# Patient Record
Sex: Female | Born: 1940 | Race: White | Hispanic: No | Smoking: Former smoker
Health system: Southern US, Community
[De-identification: ages and names within clinical notes are randomized; demographics above are authoritative.]

## PROBLEM LIST (undated history)

## (undated) DIAGNOSIS — E119 Type 2 diabetes mellitus without complications: Secondary | ICD-10-CM

## (undated) DIAGNOSIS — I2510 Atherosclerotic heart disease of native coronary artery without angina pectoris: Secondary | ICD-10-CM

## (undated) DIAGNOSIS — K219 Gastro-esophageal reflux disease without esophagitis: Secondary | ICD-10-CM

## (undated) DIAGNOSIS — I441 Atrioventricular block, second degree: Secondary | ICD-10-CM

## (undated) DIAGNOSIS — M1990 Unspecified osteoarthritis, unspecified site: Secondary | ICD-10-CM

## (undated) DIAGNOSIS — G4730 Sleep apnea, unspecified: Secondary | ICD-10-CM

## (undated) DIAGNOSIS — I480 Paroxysmal atrial fibrillation: Secondary | ICD-10-CM

## (undated) DIAGNOSIS — Z7901 Long term (current) use of anticoagulants: Secondary | ICD-10-CM

## (undated) DIAGNOSIS — E669 Obesity, unspecified: Secondary | ICD-10-CM

## (undated) DIAGNOSIS — J449 Chronic obstructive pulmonary disease, unspecified: Secondary | ICD-10-CM

## (undated) DIAGNOSIS — E785 Hyperlipidemia, unspecified: Secondary | ICD-10-CM

## (undated) DIAGNOSIS — K859 Acute pancreatitis without necrosis or infection, unspecified: Secondary | ICD-10-CM

## (undated) DIAGNOSIS — Z950 Presence of cardiac pacemaker: Secondary | ICD-10-CM

## (undated) DIAGNOSIS — I10 Essential (primary) hypertension: Secondary | ICD-10-CM

## (undated) DIAGNOSIS — Z9981 Dependence on supplemental oxygen: Secondary | ICD-10-CM

## (undated) HISTORY — PX: REPAIR RECTOCELE: SUR1206

## (undated) HISTORY — DX: Presence of cardiac pacemaker: Z95.0

## (undated) HISTORY — DX: Obesity, unspecified: E66.9

## (undated) HISTORY — DX: Atrioventricular block, second degree: I44.1

## (undated) HISTORY — PX: CYSTOCELE REPAIR: SHX163

## (undated) HISTORY — DX: Hyperlipidemia, unspecified: E78.5

## (undated) HISTORY — DX: Unspecified osteoarthritis, unspecified site: M19.90

## (undated) HISTORY — PX: TONSILLECTOMY: SUR1361

## (undated) HISTORY — DX: Long term (current) use of anticoagulants: Z79.01

## (undated) HISTORY — DX: Chronic obstructive pulmonary disease, unspecified: J44.9

## (undated) HISTORY — DX: Atherosclerotic heart disease of native coronary artery without angina pectoris: I25.10

## (undated) HISTORY — DX: Paroxysmal atrial fibrillation: I48.0

## (undated) HISTORY — PX: KNEE ARTHROSCOPY: SHX127

## (undated) HISTORY — DX: Acute pancreatitis without necrosis or infection, unspecified: K85.90

## (undated) HISTORY — PX: OVARY SURGERY: SHX727

---

## 1969-11-16 HISTORY — PX: TOTAL ABDOMINAL HYSTERECTOMY: SHX209

## 1998-02-07 ENCOUNTER — Other Ambulatory Visit: Admission: RE | Admit: 1998-02-07 | Discharge: 1998-02-07 | Payer: Self-pay | Admitting: Family Medicine

## 1999-01-09 ENCOUNTER — Ambulatory Visit (HOSPITAL_COMMUNITY): Admission: RE | Admit: 1999-01-09 | Discharge: 1999-01-09 | Payer: Self-pay | Admitting: Otolaryngology

## 1999-01-09 ENCOUNTER — Encounter: Payer: Self-pay | Admitting: Otolaryngology

## 2000-02-16 ENCOUNTER — Encounter: Admission: RE | Admit: 2000-02-16 | Discharge: 2000-02-16 | Payer: Self-pay | Admitting: Family Medicine

## 2000-02-16 ENCOUNTER — Encounter: Payer: Self-pay | Admitting: Family Medicine

## 2001-03-01 ENCOUNTER — Encounter: Payer: Self-pay | Admitting: Family Medicine

## 2001-03-01 ENCOUNTER — Encounter: Admission: RE | Admit: 2001-03-01 | Discharge: 2001-03-01 | Payer: Self-pay | Admitting: Obstetrics & Gynecology

## 2002-03-14 ENCOUNTER — Encounter: Payer: Self-pay | Admitting: Family Medicine

## 2002-03-14 ENCOUNTER — Encounter: Admission: RE | Admit: 2002-03-14 | Discharge: 2002-03-14 | Payer: Self-pay | Admitting: Family Medicine

## 2003-03-05 ENCOUNTER — Ambulatory Visit (HOSPITAL_COMMUNITY): Admission: RE | Admit: 2003-03-05 | Discharge: 2003-03-05 | Payer: Self-pay | Admitting: Gastroenterology

## 2003-03-05 ENCOUNTER — Encounter: Payer: Self-pay | Admitting: Gastroenterology

## 2003-03-07 ENCOUNTER — Encounter (INDEPENDENT_AMBULATORY_CARE_PROVIDER_SITE_OTHER): Payer: Self-pay | Admitting: Specialist

## 2003-03-07 ENCOUNTER — Ambulatory Visit (HOSPITAL_COMMUNITY): Admission: RE | Admit: 2003-03-07 | Discharge: 2003-03-07 | Payer: Self-pay | Admitting: Gastroenterology

## 2003-03-16 ENCOUNTER — Encounter: Payer: Self-pay | Admitting: Family Medicine

## 2003-03-16 ENCOUNTER — Encounter: Admission: RE | Admit: 2003-03-16 | Discharge: 2003-03-16 | Payer: Self-pay | Admitting: Family Medicine

## 2004-04-30 ENCOUNTER — Encounter: Admission: RE | Admit: 2004-04-30 | Discharge: 2004-04-30 | Payer: Self-pay | Admitting: Family Medicine

## 2005-05-05 ENCOUNTER — Encounter: Admission: RE | Admit: 2005-05-05 | Discharge: 2005-05-05 | Payer: Self-pay | Admitting: Family Medicine

## 2007-11-28 ENCOUNTER — Ambulatory Visit (HOSPITAL_COMMUNITY): Admission: RE | Admit: 2007-11-28 | Discharge: 2007-11-28 | Payer: Self-pay | Admitting: Otolaryngology

## 2008-06-26 ENCOUNTER — Encounter: Admission: RE | Admit: 2008-06-26 | Discharge: 2008-06-26 | Payer: Self-pay | Admitting: Family Medicine

## 2008-10-08 DIAGNOSIS — N3946 Mixed incontinence: Secondary | ICD-10-CM

## 2008-10-29 ENCOUNTER — Ambulatory Visit (HOSPITAL_COMMUNITY): Admission: RE | Admit: 2008-10-29 | Discharge: 2008-10-29 | Payer: Self-pay | Admitting: Otolaryngology

## 2009-01-23 DIAGNOSIS — J309 Allergic rhinitis, unspecified: Secondary | ICD-10-CM

## 2009-05-03 ENCOUNTER — Encounter: Admission: RE | Admit: 2009-05-03 | Discharge: 2009-05-03 | Payer: Self-pay | Admitting: Family Medicine

## 2009-05-24 ENCOUNTER — Emergency Department (HOSPITAL_COMMUNITY): Admission: EM | Admit: 2009-05-24 | Discharge: 2009-05-24 | Payer: Self-pay | Admitting: Emergency Medicine

## 2009-09-16 ENCOUNTER — Encounter: Admission: RE | Admit: 2009-09-16 | Discharge: 2009-09-16 | Payer: Self-pay | Admitting: Family Medicine

## 2010-05-04 ENCOUNTER — Emergency Department (HOSPITAL_BASED_OUTPATIENT_CLINIC_OR_DEPARTMENT_OTHER): Admission: EM | Admit: 2010-05-04 | Discharge: 2010-05-05 | Payer: Self-pay | Admitting: Emergency Medicine

## 2010-05-04 ENCOUNTER — Ambulatory Visit: Payer: Self-pay | Admitting: Diagnostic Radiology

## 2010-06-20 ENCOUNTER — Ambulatory Visit (HOSPITAL_COMMUNITY): Admission: RE | Admit: 2010-06-20 | Discharge: 2010-06-20 | Payer: Self-pay | Admitting: Gastroenterology

## 2010-09-18 ENCOUNTER — Telehealth (INDEPENDENT_AMBULATORY_CARE_PROVIDER_SITE_OTHER): Payer: Self-pay | Admitting: Radiology

## 2010-09-18 ENCOUNTER — Ambulatory Visit: Payer: Self-pay | Admitting: Cardiology

## 2010-09-22 ENCOUNTER — Encounter: Payer: Self-pay | Admitting: Internal Medicine

## 2010-09-22 ENCOUNTER — Encounter (HOSPITAL_COMMUNITY)
Admission: RE | Admit: 2010-09-22 | Discharge: 2010-11-15 | Payer: Self-pay | Source: Home / Self Care | Attending: Cardiology | Admitting: Cardiology

## 2010-09-22 ENCOUNTER — Ambulatory Visit: Payer: Self-pay

## 2010-09-22 ENCOUNTER — Encounter: Payer: Self-pay | Admitting: Cardiology

## 2010-09-23 ENCOUNTER — Ambulatory Visit (HOSPITAL_COMMUNITY): Admission: RE | Admit: 2010-09-23 | Discharge: 2010-09-23 | Payer: Self-pay | Admitting: Cardiology

## 2010-09-23 ENCOUNTER — Encounter: Payer: Self-pay | Admitting: Cardiology

## 2010-09-23 ENCOUNTER — Ambulatory Visit: Payer: Self-pay | Admitting: Cardiology

## 2010-09-23 ENCOUNTER — Ambulatory Visit: Payer: Self-pay

## 2010-09-23 ENCOUNTER — Encounter (INDEPENDENT_AMBULATORY_CARE_PROVIDER_SITE_OTHER): Payer: Self-pay | Admitting: *Deleted

## 2010-10-02 ENCOUNTER — Ambulatory Visit: Payer: Self-pay | Admitting: Cardiology

## 2010-10-06 ENCOUNTER — Ambulatory Visit: Payer: Self-pay | Admitting: Cardiology

## 2010-10-06 ENCOUNTER — Inpatient Hospital Stay (HOSPITAL_COMMUNITY)
Admission: RE | Admit: 2010-10-06 | Discharge: 2010-10-07 | Payer: Self-pay | Source: Home / Self Care | Admitting: Cardiology

## 2010-10-06 HISTORY — PX: INSERT / REPLACE / REMOVE PACEMAKER: SUR710

## 2010-10-22 ENCOUNTER — Encounter: Payer: Self-pay | Admitting: Internal Medicine

## 2010-10-29 ENCOUNTER — Ambulatory Visit: Payer: Self-pay

## 2010-10-29 ENCOUNTER — Encounter: Payer: Self-pay | Admitting: Internal Medicine

## 2010-10-30 ENCOUNTER — Encounter
Admission: RE | Admit: 2010-10-30 | Discharge: 2010-10-30 | Payer: Self-pay | Source: Home / Self Care | Attending: Family Medicine | Admitting: Family Medicine

## 2010-11-11 ENCOUNTER — Ambulatory Visit: Payer: Self-pay | Admitting: Cardiology

## 2010-12-02 ENCOUNTER — Ambulatory Visit: Payer: Self-pay | Admitting: Cardiology

## 2010-12-07 ENCOUNTER — Encounter: Payer: Self-pay | Admitting: Gastroenterology

## 2010-12-18 NOTE — Miscellaneous (Signed)
Summary: Device preload  Clinical Lists Changes  Observations: Added new observation of PPM INDICATN: Brady Mobitz II (10/22/2010 13:23) Added new observation of MAGNET RTE: BOL 85 ERI 65 (10/22/2010 13:23) Added new observation of PPMLEADSTAT1: active (10/22/2010 13:23) Added new observation of PPMLEADSER1: [id number]  (10/22/2010 13:23) Added new observation of PPMLEADMOD1: 4470  (10/22/2010 13:23) Added new observation of PPMLEADDOI1: 10/06/2010  (10/22/2010 13:23) Added new observation of PPMLEADLOC1: RV  (10/22/2010 13:23) Added new observation of PPM IMP Brandi Hunter: Duffy Rhody Tennant,Brandi Hunter  (10/22/2010 13:23) Added new observation of PPM DOI: 10/06/2010  (10/22/2010 13:23) Added new observation of PPM SERL#: [id number]  (10/22/2010 13:23) Added new observation of PPM MODL#: [id number]  (10/22/2010 38:18) Added new observation of PACEMAKERMFG: Medtronic  (10/22/2010 13:23) Added new observation of PACEMAKER Brandi Hunter: Brandi Range, Brandi Hunter  (10/22/2010 13:23)      PPM Specifications Following Brandi Hunter:  Brandi Range, Brandi Hunter     PPM Vendor:  Medtronic     PPM Model Number:  ADDRL1     PPM Serial Number:  [id number] H PPM DOI:  10/06/2010     PPM Implanting Brandi Hunter:  Rolla Plate  Lead 1    Location: RV     DOI: 10/06/2010     Model #: [id number]     Serial #: [id number]     Status: active  Magnet Response Rate:  BOL 85 ERI 65  Indications:  Huston Foley Mobitz II

## 2010-12-18 NOTE — Procedures (Signed)
Summary: wound check/medtronic   Current Medications (verified): 1)  Losartan Potassium 100 Mg Tabs (Losartan Potassium) .... One By Mouth Daily 2)  Amlodipine Besylate 5 Mg Tabs (Amlodipine Besylate) .... One By Mouth Daily 3)  Pantoprazole Sodium 40 Mg Tbec (Pantoprazole Sodium) .... One By Mouth Daily 4)  Metformin Hcl 500 Mg Tabs (Metformin Hcl) .... One By Mouth Two Times A Day 5)  Oxybutynin Chloride 10 Mg Xr24h-Tab (Oxybutynin Chloride) .... One By Mouth Daily 6)  Citalopram Hydrobromide 40 Mg Tabs (Citalopram Hydrobromide) .... One By Mouth Daily 7)  Fenofibrate 160 Mg Tabs (Fenofibrate) .... One By Mouth Daily 8)  Furosemide 20 Mg Tabs (Furosemide) .... One By Mouth Daily 9)  Symbicort 160-4.5 Mcg/act Aero (Budesonide-Formoterol Fumarate) .... 2 Puffs Two Times A Day 10)  Pravachol 40 Mg Tabs (Pravastatin Sodium) .... One By Mouth Daily  Allergies (verified): No Known Drug Allergies  PPM Specifications Following MD:  Hillis Range, MD     PPM Vendor:  Medtronic     PPM Model Number:  ADDRL1     PPM Serial Number:  [id number] PPM DOI:  10/06/2010     PPM Implanting MD:  Duffy Rhody Tennant,MD  Lead 1    Location: RV     DOI: 10/06/2010     Model #: 4470     Serial #: [id number]     Status: active  Magnet Response Rate:  BOL 85 ERI 65  Indications:  Huston Foley Mobitz II   PPM Follow Up Remote Check?  No Battery Voltage:  2.79 V     Battery Est. Longevity:  9.5 years     Pacer Dependent:  No       PPM Device Measurements Atrium  Amplitude: 2.8 mV, Impedance: 456 ohms, Threshold: 0.5 V at 0.4 msec Right Ventricle  Amplitude: 11.20 mV, Impedance: 637 ohms, Threshold: 0.625 V at 0.4 msec  Episodes MS Episodes:  0     Percent Mode Switch:  0     Coumadin:  No Ventricular High Rate:  0     Atrial Pacing:  49.2%     Ventricular Pacing:  5.7%  Parameters Mode:  DDD     Lower Rate Limit:  60     Upper Rate Limit:  120 Paced AV Delay:  240     Sensed AV Delay:  240 Next Cardiology Appt  Due:  01/15/2011 Tech Comments:  Steri strips removed, no  redness or edema noted.   No parameter changes.  Device function normal.  ROV 3 months with Dr. Johney Frame. Altha Harm, LPN  October 29, 2010 11:56 AM

## 2010-12-18 NOTE — Cardiovascular Report (Signed)
Summary: Office Visit   Office Visit   Imported By: Roderic Ovens 10/30/2010 14:06:41  _____________________________________________________________________  External Attachment:    Type:   Image     Comment:   External Document

## 2010-12-18 NOTE — Letter (Signed)
Summary: Outpatient Coinsurance Notice  Outpatient Coinsurance Notice   Imported By: Marylou Mccoy 10/23/2010 15:15:16  _____________________________________________________________________  External Attachment:    Type:   Image     Comment:   External Document

## 2010-12-18 NOTE — Progress Notes (Signed)
Summary: nuc pre-procedure  Phone Note Outgoing Call   Call placed by: Domenic Polite, CNMT,  September 18, 2010 2:08 PM Call placed to: Patient Reason for Call: Confirm/change Appt Summary of Call: Reviewed information on [employer] Information Sheet (see scanned document for further details).  Spoke with patient.  Initial call taken by: Domenic Polite, CNMT,  September 18, 2010 2:08 PM     Nuclear Med Background Indications for Stress Test: Evaluation for Ischemia, Surgical Clearance  Indications Comments: Pre-Op  eval.--Dr. Thomasena Edis  History: COPD      Nuclear Pre-Procedure Cardiac Risk Factors: Family History - CAD, Hypertension, Lipids

## 2010-12-18 NOTE — Assessment & Plan Note (Signed)
Summary: Cardiology Nuclear Testing  Nuclear Med Background Indications for Stress Test: Evaluation for Ischemia, Surgical Clearance  Indications Comments: Pre-Op  eval.--Dr. Thomasena Edis  History: COPD   Symptoms: DOE, Fatigue, Nausea, Palpitations, SOB    Nuclear Pre-Procedure Cardiac Risk Factors: Family History - CAD, Hypertension, Lipids Caffeine/Decaff Intake: None NPO After: 8:00 PM Lungs: clear IV 0.9% NS with Angio Cath: 22g     IV Site: R Antecubital IV Started by: Irean Hong, RN Chest Size (in) 42     Cup Size C     Height (in): 67 Weight (lb): 258 BMI: 40.55  Nuclear Med Study 1 or 2 day study:  2 day     Stress Test Type:  Eugenie Birks Reading MD:  Willa Rough, MD     Referring MD:  S.Tennant Resting Radionuclide:  Technetium 72m Tetrofosmin     Resting Radionuclide Dose:  31.4 mCi  Stress Radionuclide:  Technetium 38m Tetrofosmin     Stress Radionuclide Dose:  32.8 mCi   Stress Protocol  Max Systolic BP: 127 mm Hg Lexiscan: 0.4 mg   Stress Test Technologist:  Milana Na, EMT-P     Nuclear Technologist:  Doyne Keel, CNMT  Rest Procedure  Myocardial perfusion imaging was performed at rest 45 minutes following the intravenous administration of Technetium 25m Tetrofosmin.  Stress Procedure  The patient received IV Lexiscan 0.4 mg over 15-seconds.  Technetium 87m Tetrofosmin injected at 30-seconds.  There were no significant changes and pvcs/blocked pacs with infusion.  Quantitative spect images were obtained after a 45 minute delay.  QPS Raw Data Images:  Normal; no motion artifact; normal heart/lung ratio. Stress Images:  Normal homogeneous uptake in all areas of the myocardium. Rest Images:  Normal homogeneous uptake in all areas of the myocardium. Subtraction (SDS):  No evidence of ischemia. Transient Ischemic Dilatation:  0.69  (Normal <1.22)  Lung/Heart Ratio:  0.33  (Normal <0.45)  Quantitative Gated Spect Images QGS EDV:  85 ml QGS ESV:  24  ml QGS EF:  72 % QGS cine images:  Normal moation  Findings Normal nuclear study      Overall Impression  Exercise Capacity: Lexiscan with no exercise. BP Response: Normal blood pressure response. Clinical Symptoms: SOB ECG Impression: No significant ST segment change suggestive of ischemia. Overall Impression: Normal stress nuclear study.

## 2010-12-26 DIAGNOSIS — IMO0002 Reserved for concepts with insufficient information to code with codable children: Secondary | ICD-10-CM | POA: Insufficient documentation

## 2010-12-26 DIAGNOSIS — M1710 Unilateral primary osteoarthritis, unspecified knee: Secondary | ICD-10-CM | POA: Insufficient documentation

## 2010-12-30 DIAGNOSIS — E559 Vitamin D deficiency, unspecified: Secondary | ICD-10-CM | POA: Insufficient documentation

## 2011-01-05 ENCOUNTER — Ambulatory Visit: Payer: Medicare Other | Attending: Specialist | Admitting: Physical Therapy

## 2011-01-05 DIAGNOSIS — R5381 Other malaise: Secondary | ICD-10-CM | POA: Insufficient documentation

## 2011-01-05 DIAGNOSIS — M25569 Pain in unspecified knee: Secondary | ICD-10-CM | POA: Insufficient documentation

## 2011-01-05 DIAGNOSIS — IMO0001 Reserved for inherently not codable concepts without codable children: Secondary | ICD-10-CM | POA: Insufficient documentation

## 2011-01-07 ENCOUNTER — Ambulatory Visit: Payer: Medicare Other | Admitting: Physical Therapy

## 2011-01-13 ENCOUNTER — Ambulatory Visit: Payer: Medicare Other | Admitting: Physical Therapy

## 2011-01-15 ENCOUNTER — Ambulatory Visit: Payer: Medicare Other | Attending: Specialist | Admitting: Physical Therapy

## 2011-01-15 DIAGNOSIS — M25569 Pain in unspecified knee: Secondary | ICD-10-CM | POA: Insufficient documentation

## 2011-01-15 DIAGNOSIS — R5381 Other malaise: Secondary | ICD-10-CM | POA: Insufficient documentation

## 2011-01-15 DIAGNOSIS — IMO0001 Reserved for inherently not codable concepts without codable children: Secondary | ICD-10-CM | POA: Insufficient documentation

## 2011-01-19 ENCOUNTER — Ambulatory Visit: Payer: Medicare Other | Admitting: Physical Therapy

## 2011-01-22 ENCOUNTER — Ambulatory Visit: Payer: Medicare Other | Admitting: *Deleted

## 2011-01-26 ENCOUNTER — Encounter: Payer: Self-pay | Admitting: Internal Medicine

## 2011-01-27 ENCOUNTER — Ambulatory Visit: Payer: Medicare Other | Admitting: Physical Therapy

## 2011-01-27 LAB — SURGICAL PCR SCREEN: MRSA, PCR: NEGATIVE

## 2011-01-27 LAB — GLUCOSE, CAPILLARY
Glucose-Capillary: 107 mg/dL — ABNORMAL HIGH (ref 70–99)
Glucose-Capillary: 153 mg/dL — ABNORMAL HIGH (ref 70–99)

## 2011-01-30 ENCOUNTER — Ambulatory Visit: Payer: Medicare Other | Admitting: Physical Therapy

## 2011-02-01 LAB — DIFFERENTIAL
Eosinophils Absolute: 0.4 10*3/uL (ref 0.0–0.7)
Lymphocytes Relative: 27 % (ref 12–46)
Lymphs Abs: 2.3 10*3/uL (ref 0.7–4.0)
Monocytes Relative: 6 % (ref 3–12)
Neutro Abs: 5.5 10*3/uL (ref 1.7–7.7)
Neutrophils Relative %: 63 % (ref 43–77)

## 2011-02-01 LAB — POCT CARDIAC MARKERS
CKMB, poc: 1.5 ng/mL (ref 1.0–8.0)
CKMB, poc: 1.8 ng/mL (ref 1.0–8.0)
Myoglobin, poc: 95.6 ng/mL (ref 12–200)
Troponin i, poc: <0.05 ng/mL (ref 0.00–0.09)

## 2011-02-01 LAB — CBC
Hemoglobin: 13.5 g/dL (ref 12.0–15.0)
MCHC: 32.9 g/dL (ref 30.0–36.0)
MCV: 88.5 fL (ref 78.0–100.0)
RBC: 4.62 MIL/uL (ref 3.87–5.11)
RDW: 12.6 % (ref 11.5–15.5)

## 2011-02-01 LAB — COMPREHENSIVE METABOLIC PANEL
BUN: 17 mg/dL (ref 6–23)
CO2: 34 mEq/L — ABNORMAL HIGH (ref 19–32)
Calcium: 9.2 mg/dL (ref 8.4–10.5)
Creatinine, Ser: 0.9 mg/dL (ref 0.4–1.2)
GFR calc non Af Amer: >60 mL/min (ref >60)
Glucose, Bld: 167 mg/dL — ABNORMAL HIGH (ref 70–99)
Sodium: 143 mEq/L (ref 135–145)
Total Protein: 6.8 g/dL (ref 6.0–8.3)

## 2011-02-01 LAB — URINALYSIS, ROUTINE W REFLEX MICROSCOPIC
Glucose, UA: NEGATIVE mg/dL
Ketones, ur: 15 mg/dL — AB
Nitrite: NEGATIVE
Specific Gravity, Urine: 1.025 (ref 1.005–1.030)
pH: 6 (ref 5.0–8.0)

## 2011-02-01 LAB — LIPASE, BLOOD
Lipase: 1957 U/L — ABNORMAL HIGH (ref 23–300)
Lipase: 4087 U/L — ABNORMAL HIGH (ref 23–300)

## 2011-02-01 LAB — URINE CULTURE

## 2011-02-01 LAB — GLUCOSE, CAPILLARY

## 2011-02-03 ENCOUNTER — Ambulatory Visit: Payer: Medicare Other | Admitting: Physical Therapy

## 2011-02-05 DIAGNOSIS — F17200 Nicotine dependence, unspecified, uncomplicated: Secondary | ICD-10-CM | POA: Insufficient documentation

## 2011-02-06 ENCOUNTER — Ambulatory Visit: Payer: Medicare Other | Admitting: Physical Therapy

## 2011-02-09 ENCOUNTER — Ambulatory Visit: Payer: Medicare Other | Admitting: Physical Therapy

## 2011-02-09 ENCOUNTER — Ambulatory Visit (INDEPENDENT_AMBULATORY_CARE_PROVIDER_SITE_OTHER): Payer: Medicare Other | Admitting: Internal Medicine

## 2011-02-09 ENCOUNTER — Encounter: Payer: Self-pay | Admitting: Internal Medicine

## 2011-02-09 DIAGNOSIS — E669 Obesity, unspecified: Secondary | ICD-10-CM

## 2011-02-09 DIAGNOSIS — I441 Atrioventricular block, second degree: Secondary | ICD-10-CM | POA: Insufficient documentation

## 2011-02-09 DIAGNOSIS — I10 Essential (primary) hypertension: Secondary | ICD-10-CM

## 2011-02-09 NOTE — Assessment & Plan Note (Signed)
Stable No changes today Salt restriction advised

## 2011-02-09 NOTE — Assessment & Plan Note (Signed)
Normal pacemaker function See PACEART report No changes today Return 11/12

## 2011-02-09 NOTE — Progress Notes (Signed)
The patient presents today for routine electrophysiology followup.  She was recently scheduled for arthroscopic knee surgery. However, upon evaluation, she was found to have Mobitz II AV block.  She reported symptoms of fatigue and therefore underwent PPM (MDT) implantation by Dr Deborah Chalk 10/06/10.  She subsequently had her knee surgery and has done quite well.   Today, she denies symptoms of palpitations, chest pain, shortness of breath, orthopnea, PND, lower extremity edema, dizziness, presyncope, syncope, or neurologic sequela.  The patient feels that she is tolerating medications without difficulties and is otherwise without complaint today.   Past Medical History  Diagnosis Date  . Pacemaker     Implanted  by Dr Deborah Chalk (MDT) 10/06/10  . Mobitz (type) II atrioventricular block   . HTN (hypertension)   . Diabetes in pregnancy   . Obesity   . Hyperlipidemia   . DJD (degenerative joint disease)    Past Surgical History  Procedure Date  . Insert / replace / remove pacemaker 10/06/10    MDT  implanted by Dr Deborah Chalk  . Total abdominal hysterectomy 1971    Current outpatient prescriptions:albuterol (PROAIR HFA) 108 (90 BASE) MCG/ACT inhaler, Inhale 2 puffs into the lungs every 6 (six) hours as needed.  , Disp: , Rfl: ;  amLODipine (NORVASC) 5 MG tablet, Take 5 mg by mouth daily.  , Disp: , Rfl: ;  budesonide-formoterol (SYMBICORT) 160-4.5 MCG/ACT inhaler, Inhale 2 puffs into the lungs 2 (two) times daily.  , Disp: , Rfl:  Calcium Carbonate-Vitamin D (RA CALCIUM PLUS VITAMIN D) 600-400 MG-UNIT per tablet, Take 2 tablets by mouth daily.  , Disp: , Rfl: ;  Cholecalciferol (VITAMIN D) 2000 UNITS CAPS, Take 1 capsule by mouth daily.  , Disp: , Rfl: ;  citalopram (CELEXA) 40 MG tablet, Take 40 mg by mouth daily.  , Disp: , Rfl: ;  fenofibrate 160 MG tablet, Take 160 mg by mouth daily.  , Disp: , Rfl:  furosemide (LASIX) 20 MG tablet, Take 20 mg by mouth daily.  , Disp: , Rfl: ;  LORazepam (ATIVAN) 0.5  MG tablet, Take 0.5 mg by mouth 2 (two) times daily as needed.  , Disp: , Rfl: ;  losartan (COZAAR) 100 MG tablet, Take 100 mg by mouth daily.  , Disp: , Rfl: ;  metFORMIN (GLUCOPHAGE) 500 MG tablet, Take 1,000 mg by mouth 2 (two) times daily with a meal. , Disp: , Rfl:  metoprolol succinate (TOPROL-XL) 25 MG 24 hr tablet, Take 50 mg by mouth daily.  , Disp: , Rfl: ;  oxybutynin (DITROPAN-XL) 10 MG 24 hr tablet, Take 10 mg by mouth daily.  , Disp: , Rfl: ;  pantoprazole (PROTONIX) 40 MG tablet, Take 40 mg by mouth daily.  , Disp: , Rfl: ;  pravastatin (PRAVACHOL) 40 MG tablet, Take 40 mg by mouth daily.  , Disp: , Rfl:  tiotropium (SPIRIVA HANDIHALER) 18 MCG inhalation capsule, Place 18 mcg into inhaler and inhale daily.  , Disp: , Rfl:   No Known Allergies  History   Social History  . Marital Status: Widowed    Spouse Name: N/A    Number of Children: N/A  . Years of Education: N/A   Occupational History  . Not on file.   Social History Main Topics  . Smoking status: Current Everyday Smoker  . Smokeless tobacco: Not on file  . Alcohol Use: No  . Drug Use: No  . Sexually Active: Not on file   Other Topics Concern  .  Not on file   Social History Narrative  . No narrative on file    Family History  Problem Relation Age of Onset  . Diabetes     Physical Exam: Filed Vitals:   02/09/11 1156  BP: 142/76  Pulse: 60  Height: 5\' 5"  (1.651 m)  Weight: 256 lb 12.8 oz (116.484 kg)    GEN- The patient is well appearing, alert and oriented x 3 today.   Head- normocephalic, atraumatic Eyes-  Sclera clear, conjunctiva pink Ears- hearing intact Oropharynx- clear Neck- supple, no JVP Lymph- no cervical lymphadenopathy Lungs- Clear to ausculation bilaterally, normal work of breathing Chest- R sided pacemaker pocket is well healed Heart- Regular rate and rhythm, no murmurs, rubs or gallops, PMI not laterally displaced GI- soft, NT, ND, + BS Extremities- no clubbing, cyanosis, or  edema MS- s/p knee surgery Skin- no rash or lesion Psych- euthymic mood, full affect Neuro- strength and sensation are intact

## 2011-02-13 ENCOUNTER — Ambulatory Visit: Payer: Medicare Other | Admitting: Physical Therapy

## 2011-02-17 ENCOUNTER — Ambulatory Visit: Payer: Medicare Other | Attending: Specialist | Admitting: Physical Therapy

## 2011-02-17 DIAGNOSIS — IMO0001 Reserved for inherently not codable concepts without codable children: Secondary | ICD-10-CM | POA: Insufficient documentation

## 2011-02-17 DIAGNOSIS — M25569 Pain in unspecified knee: Secondary | ICD-10-CM | POA: Insufficient documentation

## 2011-02-17 DIAGNOSIS — R5381 Other malaise: Secondary | ICD-10-CM | POA: Insufficient documentation

## 2011-02-19 ENCOUNTER — Ambulatory Visit: Payer: Medicare Other | Admitting: Physical Therapy

## 2011-02-24 ENCOUNTER — Ambulatory Visit: Payer: Medicare Other | Admitting: Physical Therapy

## 2011-02-27 ENCOUNTER — Ambulatory Visit: Payer: Medicare Other | Admitting: *Deleted

## 2011-03-03 ENCOUNTER — Ambulatory Visit: Payer: Medicare Other | Admitting: Physical Therapy

## 2011-03-06 ENCOUNTER — Ambulatory Visit: Payer: Medicare Other | Admitting: Physical Therapy

## 2011-03-10 ENCOUNTER — Ambulatory Visit: Payer: Medicare Other | Admitting: Physical Therapy

## 2011-03-13 ENCOUNTER — Ambulatory Visit: Payer: Medicare Other | Admitting: Physical Therapy

## 2011-03-17 ENCOUNTER — Ambulatory Visit: Payer: Medicare Other | Attending: Specialist | Admitting: Physical Therapy

## 2011-03-17 DIAGNOSIS — R5381 Other malaise: Secondary | ICD-10-CM | POA: Insufficient documentation

## 2011-03-17 DIAGNOSIS — IMO0001 Reserved for inherently not codable concepts without codable children: Secondary | ICD-10-CM | POA: Insufficient documentation

## 2011-03-17 DIAGNOSIS — M25569 Pain in unspecified knee: Secondary | ICD-10-CM | POA: Insufficient documentation

## 2011-03-20 ENCOUNTER — Ambulatory Visit: Payer: Medicare Other | Admitting: Physical Therapy

## 2011-03-24 ENCOUNTER — Ambulatory Visit: Payer: Medicare Other | Admitting: Physical Therapy

## 2011-03-27 ENCOUNTER — Ambulatory Visit: Payer: Medicare Other | Admitting: Physical Therapy

## 2011-03-27 DIAGNOSIS — K219 Gastro-esophageal reflux disease without esophagitis: Secondary | ICD-10-CM | POA: Insufficient documentation

## 2011-03-27 DIAGNOSIS — J449 Chronic obstructive pulmonary disease, unspecified: Secondary | ICD-10-CM | POA: Insufficient documentation

## 2011-03-27 DIAGNOSIS — E782 Mixed hyperlipidemia: Secondary | ICD-10-CM | POA: Insufficient documentation

## 2011-03-31 ENCOUNTER — Ambulatory Visit: Payer: Medicare Other | Admitting: Physical Therapy

## 2011-04-03 ENCOUNTER — Ambulatory Visit: Payer: Medicare Other | Admitting: *Deleted

## 2011-04-03 NOTE — Op Note (Signed)
NAME:  Brandi Hunter, Brandi Hunter                             ACCOUNT NO.:  [id number]   MEDICAL RECORD NO.:  [id number]                   PATIENT TYPE:  AMB   LOCATION:  ENDO                                 FACILITY:  [facility]   PHYSICIAN:  Anselmo Rod, M.D.               DATE OF BIRTH:  May 11, 1941   DATE OF PROCEDURE:  03/07/2003  DATE OF DISCHARGE:                                 OPERATIVE REPORT   PROCEDURE:  Screening colonoscopy.   ENDOSCOPIST:  Charna Elizabeth, M.D.   INSTRUMENT USED:  Olympus video colonoscope.   INDICATIONS FOR PROCEDURE:  A 70 year old white female underwent screening  colonoscopy.  The patient has a family history of colon cancer in a maternal  aunt and breast cancer in maternal grandmother.  Rule out colonic polyps,  masses, etc.  She also has had a history of black stools in the recent past,  and therefore, right-sided colonic lesions need to be ruled out.   PREPROCEDURE PREPARATION:  Informed consent was procured from the patient.  The patient fasted for eight hours prior to the procedure, prepped with a  bottle of magnesium citrate and a gallon of GoLYTELY the night prior to  procedure.   PREPROCEDURE PHYSICAL:  Patient with stable vital signs.  Neck supple.  Chest clear to auscultation.  S1, S2.  Abdomen soft with normal bowel  sounds.   DESCRIPTION OF PROCEDURE:  The patient was placed in the left lateral  decubitus position, sedated with 20 mg of Demerol and 3.5 mg of Versed  intravenously.  Once the patient was adequately sedated and maintained on  low flow oxygen and continuous cardiac monitoring, the Olympus video  colonoscope was advanced into the rectum to the cecum and terminal ileum  with difficulty.  There was a large amount of residual stool and multiple  washings were done.  The patient's position was changed from the left  lateral to the supine and the right lateral position __________.  No masses,  polyps, erosions, or diverticula were seen.   Small lesions could have been  missed due to inadequate prep.  Retroflexion of rectum revealed no  abnormalities.   IMPRESSION:  1. No abnormalities noted at terminal ileum.  2. Large amount of residual stool in the colon.  Small lesions could have     been missed.   RECOMMENDATIONS:  1. Repeat colorectal cancer screening is recommended in the next three years     unless the patient develops any     abnormal symptoms.  2. High fiber diet and __________ has been advised.  3. Outpatient follow up in the next two weeks or earlier if needed.  Anselmo Rod, M.D.    JNM/MEDQ  D:  03/07/2003  T:  03/07/2003  Job:  [id number]   cc:   Teena Irani. Arlyce Dice, M.D.  [address]  [place]  [place] [address]  Fax: [phone]

## 2011-04-03 NOTE — Op Note (Signed)
NAME:  Brandi Hunter, Brandi Hunter                             ACCOUNT NO.:  [id number]   MEDICAL RECORD NO.:  [id number]                   PATIENT TYPE:  AMB   LOCATION:  ENDO                                 FACILITY:  [facility]   PHYSICIAN:  Anselmo Rod, M.D.               DATE OF BIRTH:  [birth year]   DATE OF PROCEDURE:  03/07/2003  DATE OF DISCHARGE:                                 OPERATIVE REPORT   PROCEDURE:  Esophagogastroduodenoscopy with biopsies.   ENDOSCOPIST:  Charna Elizabeth, M.D.   INSTRUMENT USED:  Olympus video pan endoscope   INDICATIONS FOR PROCEDURE:  A 70 year-old white female with a history of  abdominal pain and black stools.  Rule out peptic ulcer disease,  esophagitis, gastritis, etc.  The patient also has had some dysphagia  intermittently.  Rule out strictures.   PREPROCEDURE PREPARATION:  Informed Consent was obtained from the patient.  The patient fasted for eight hours prior to the procedure.   PREPROCEDURE PHYSICAL:  Patient with stable vital signs.  Neck supple.  Chest clear to auscultation.  S1, S2 regular.  Abdomen soft with normal  bowel sounds.   DESCRIPTION OF PROCEDURE:  The patient was placed in the left lateral  decubitus position, sedated with 50 mg of Demerol and 5 mg of Versed  intravenously.  Once the patient was adequately sedated and maintained on  low flow oxygen and continuous cardiac monitoring, the Olympus video pan  endoscope was advanced with the mouth piece over the tongue into the  esophagus under direct vision.  The entire esophagus appeared normal with no  evidence of ring, stricture, masses, esophagitis, Barrett's mucosa.  The  scope was then advanced into the stomach.  Linear ulceration was seen in the  antrum with a few erosions.  Biopsies were done to rule out presence of  Helicobacter pylori by Pathology.  Retroflexion in high cardia revealed no  abnormalities.  The proximal small bowel appeared normal and without  lesions.   IMPRESSION:  1. Linear superficial ulcerations in the antrum with few erosions.  Biopsies     done to rule out Helicobacter pylori.  2. Normal-appearing esophagus and proximal small bowel.   RECOMMENDATIONS:  1. Await Pathology results.  2. Continue proton pump inhibitor.  3. Follow antireflux instructions.  4.     Weight loss advised.  5. Avoid all non-steroidal including aspirin.  6. Proceed with colonoscopy at this time.                                               Anselmo Rod, M.D.    JNM/MEDQ  D:  03/07/2003  T:  03/07/2003  Job:  [id number]   cc:   Teena Irani. Arlyce Dice, M.D.  [address]  [place]  [place] [address]  Fax: [phone]

## 2011-04-22 ENCOUNTER — Other Ambulatory Visit (HOSPITAL_COMMUNITY): Payer: Self-pay | Admitting: Family Medicine

## 2011-04-22 DIAGNOSIS — R1011 Right upper quadrant pain: Secondary | ICD-10-CM

## 2011-04-22 DIAGNOSIS — R112 Nausea with vomiting, unspecified: Secondary | ICD-10-CM

## 2011-05-05 ENCOUNTER — Encounter (HOSPITAL_COMMUNITY)
Admission: RE | Admit: 2011-05-05 | Discharge: 2011-05-05 | Disposition: A | Discharge status: Home / Self Care | Payer: Medicare Other | Source: Ambulatory Visit | Attending: Family Medicine | Admitting: Family Medicine

## 2011-05-05 DIAGNOSIS — R112 Nausea with vomiting, unspecified: Secondary | ICD-10-CM | POA: Insufficient documentation

## 2011-05-05 DIAGNOSIS — R1011 Right upper quadrant pain: Secondary | ICD-10-CM | POA: Insufficient documentation

## 2011-05-05 MED ORDER — TECHNETIUM TC 99M SULFUR COLLOID
2.0000 | Freq: Once | INTRAVENOUS | Status: AC | PRN
  Administered 2011-05-05: 2 via INTRAVENOUS

## 2011-05-11 ENCOUNTER — Inpatient Hospital Stay (HOSPITAL_COMMUNITY)
Admission: EM | Admit: 2011-05-11 | Discharge: 2011-05-13 | DRG: 312 | Disposition: A | Discharge status: Home / Self Care | Payer: Medicare Other | Attending: Internal Medicine | Admitting: Internal Medicine

## 2011-05-11 ENCOUNTER — Emergency Department (HOSPITAL_COMMUNITY): Payer: Medicare Other

## 2011-05-11 DIAGNOSIS — F3289 Other specified depressive episodes: Secondary | ICD-10-CM | POA: Diagnosis present

## 2011-05-11 DIAGNOSIS — E669 Obesity, unspecified: Secondary | ICD-10-CM | POA: Diagnosis present

## 2011-05-11 DIAGNOSIS — J4489 Other specified chronic obstructive pulmonary disease: Secondary | ICD-10-CM | POA: Diagnosis present

## 2011-05-11 DIAGNOSIS — I441 Atrioventricular block, second degree: Secondary | ICD-10-CM | POA: Diagnosis present

## 2011-05-11 DIAGNOSIS — F17200 Nicotine dependence, unspecified, uncomplicated: Secondary | ICD-10-CM | POA: Diagnosis present

## 2011-05-11 DIAGNOSIS — Z9981 Dependence on supplemental oxygen: Secondary | ICD-10-CM

## 2011-05-11 DIAGNOSIS — J449 Chronic obstructive pulmonary disease, unspecified: Secondary | ICD-10-CM | POA: Diagnosis present

## 2011-05-11 DIAGNOSIS — E785 Hyperlipidemia, unspecified: Secondary | ICD-10-CM | POA: Diagnosis present

## 2011-05-11 DIAGNOSIS — M1990 Unspecified osteoarthritis, unspecified site: Secondary | ICD-10-CM | POA: Diagnosis present

## 2011-05-11 DIAGNOSIS — E119 Type 2 diabetes mellitus without complications: Secondary | ICD-10-CM | POA: Diagnosis present

## 2011-05-11 DIAGNOSIS — I10 Essential (primary) hypertension: Secondary | ICD-10-CM | POA: Diagnosis present

## 2011-05-11 DIAGNOSIS — Z950 Presence of cardiac pacemaker: Secondary | ICD-10-CM

## 2011-05-11 DIAGNOSIS — R55 Syncope and collapse: Principal | ICD-10-CM | POA: Diagnosis present

## 2011-05-11 DIAGNOSIS — F329 Major depressive disorder, single episode, unspecified: Secondary | ICD-10-CM | POA: Diagnosis present

## 2011-05-11 LAB — CK TOTAL AND CKMB (NOT AT ARMC)
CK, MB: 2.5 ng/mL (ref 0.3–4.0)
Relative Index: INVALID (ref 0.0–2.5)
Total CK: 49 U/L (ref 7–177)

## 2011-05-11 LAB — GLUCOSE, CAPILLARY: Glucose-Capillary: 183 mg/dL — ABNORMAL HIGH (ref 70–99)

## 2011-05-11 LAB — CBC
MCHC: 34.9 g/dL (ref 30.0–36.0)
RDW: 13.5 % (ref 11.5–15.5)
WBC: 20.9 10*3/uL — ABNORMAL HIGH (ref 4.0–10.5)

## 2011-05-11 LAB — URINALYSIS, ROUTINE W REFLEX MICROSCOPIC
Hgb urine dipstick: NEGATIVE
Ketones, ur: 15 mg/dL — AB
Protein, ur: >300 mg/dL — AB
Urobilinogen, UA: 1 mg/dL (ref 0.0–1.0)

## 2011-05-11 LAB — URINE MICROSCOPIC-ADD ON

## 2011-05-11 LAB — BASIC METABOLIC PANEL
GFR calc Af Amer: >60 mL/min (ref >60)
GFR calc non Af Amer: >60 mL/min (ref >60)
Potassium: 3.1 mEq/L — ABNORMAL LOW (ref 3.5–5.1)
Sodium: 135 mEq/L (ref 135–145)

## 2011-05-11 LAB — DIFFERENTIAL
Basophils Absolute: 0 10*3/uL (ref 0.0–0.1)
Basophils Relative: 0 % (ref 0–1)
Eosinophils Relative: 0 % (ref 0–5)
Monocytes Absolute: 1.4 10*3/uL — ABNORMAL HIGH (ref 0.1–1.0)
Neutro Abs: 17 10*3/uL — ABNORMAL HIGH (ref 1.7–7.7)

## 2011-05-12 ENCOUNTER — Inpatient Hospital Stay (HOSPITAL_COMMUNITY): Payer: Medicare Other

## 2011-05-12 DIAGNOSIS — R55 Syncope and collapse: Secondary | ICD-10-CM

## 2011-05-12 LAB — BASIC METABOLIC PANEL
BUN: 27 mg/dL — ABNORMAL HIGH (ref 6–23)
CO2: 32 mEq/L (ref 19–32)
Chloride: 96 mEq/L (ref 96–112)
Creatinine, Ser: 1.36 mg/dL — ABNORMAL HIGH (ref 0.50–1.10)

## 2011-05-12 LAB — CBC
HCT: 39.4 % (ref 36.0–46.0)
Hemoglobin: 13.6 g/dL (ref 12.0–15.0)
MCV: 86 fL (ref 78.0–100.0)
RBC: 4.58 MIL/uL (ref 3.87–5.11)
WBC: 16.4 10*3/uL — ABNORMAL HIGH (ref 4.0–10.5)

## 2011-05-13 LAB — URINE CULTURE: Colony Count: 15000

## 2011-05-14 ENCOUNTER — Encounter: Payer: Medicare Other | Admitting: Cardiology

## 2011-05-14 ENCOUNTER — Telehealth: Payer: Self-pay | Admitting: Internal Medicine

## 2011-05-14 NOTE — Telephone Encounter (Signed)
Patient states was in the hospital this past Monday for dehyadration and abnormal CBC. She was d/c yesterday. She was taken off the B/P medications.  Patient would like to know if she can go back to work next week July 4 th. She work part time as a Scientist, physiological.

## 2011-05-14 NOTE — Telephone Encounter (Signed)
Pt calling re going back to work after being d/c yesterday and any restrictions she may have was told to stay out until 6-29 and wants to make sure still ok to go back monday

## 2011-05-15 NOTE — Telephone Encounter (Signed)
Discussed with Dr Johney Frame patient may return to work next week  No driving for 6 weeks patient aware and will return to work on 05/21/2011

## 2011-05-25 ENCOUNTER — Encounter (INDEPENDENT_AMBULATORY_CARE_PROVIDER_SITE_OTHER): Payer: Self-pay | Admitting: General Surgery

## 2011-05-27 ENCOUNTER — Ambulatory Visit (INDEPENDENT_AMBULATORY_CARE_PROVIDER_SITE_OTHER): Payer: Medicare Other | Admitting: General Surgery

## 2011-05-27 ENCOUNTER — Encounter (INDEPENDENT_AMBULATORY_CARE_PROVIDER_SITE_OTHER): Payer: Self-pay | Admitting: General Surgery

## 2011-05-27 VITALS — BP 146/94 | HR 64 | Temp 96.6°F | Ht 65.5 in | Wt 251.2 lb

## 2011-05-27 DIAGNOSIS — K589 Irritable bowel syndrome without diarrhea: Secondary | ICD-10-CM

## 2011-05-27 NOTE — Progress Notes (Signed)
She is referred by Elizabeth Palau, NP, for evaluation of abdominal pain. I saw her back in August of 2011 for the same and did not think she had gallbladder disease. She had a gastric emptying scan recently that was normal. She describes crampy upper and lower abdominal pains at times specifically after a spicy or greasy meal. This is to be followed by a significant bowel movement (diarrhea) and relief of the pain. She's also been under a lot of stress recently. Sometimes when she is a bland diet she does not have this problem.  PE: General: Obese female in no acute distress.  Abdomen: Soft nontender nondistended no palpable masses or organomegaly.  Data reviewed: My office notes, notes from [place] [facility], gastric emptying scan results.  Assessment: Signs and symptoms are very suspicious for irritable bowel syndrome. Did not think the etiology is from gallbladder disease.  Plan: Referral to Dr. Loreta Ave of gastroenterology. Bland diet. Return visit p.r.n.

## 2011-06-04 NOTE — H&P (Signed)
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  Last week, the patient saw her primary care physician with complaint of a cough and increased shortness of breath.  She was treated for COPD exacerbation. She finished a course of moxifloxacin and prednisone this last Friday. She says she saw her primary care physician in the office on Friday and she says that she was actually orthostatic blood pressure that day. Last night the patient took her blood pressure medications.  She did not get up to around 12 noon today.  She went to the door to let her brother- in-law in the house and while standing at the door talking to her brother-in-law she became lightheaded.  She passed out and fell.  She hit her head.  She does say she has not been eating and drinking normally for the last few days.  Her pacemaker was interrogated tonight. It showed no significant arrhythmia associated with her syncopal episode.  She did have an atrial heart rate event this evening that appears to be due to PACs.  PAST MEDICAL HISTORY: 1. Hypertension. 2. Diabetes. 3. Depression. 4. Hyperlipidemia. 5. 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 6. Echocardiogram November 2011, showed EF 60%, mild LVH, mild MR,     moderate left atrial enlargement, mild TR. 7. Adenosine Myoview in November 2011, showed  no ischemia. 8. Obesity. 9. Osteoarthritis. 10.COPD on home O2, the patient is an active smoker. 11.History of hysterectomy.  SOCIAL HISTORY:  The patient is a widow.  She smokes a pack a week tobacco.  She has three children.  She works part-time as a Scientist, physiological.  She lives in [place].  MEDICATIONS: 1. Losartan 100 mg daily. 2. Amlodipine 5 mg daily. 3. Protonix 40 mg daily. 4. Metformin 500 mg b.i.d. 5. Oxybutynin 10 mg daily. 6. Citalopram 40 mg daily. 7. Lasix 20 mg daily. 8. Fenofibrate 160 mg daily. 9. Pravastatin 40 mg daily. 10.Albuterol metered-dose inhaler. 11.Symbicort b.i.d. 12.Toprol-XL 50 mg daily. 13.Spiriva.  REVIEW OF SYSTEMS:  All systems were reviewed were negative except as noted in the history of present illness.  FAMILY HISTORY:  The patient's mother had congestive heart failure, she lived to be 20; however, her father had a stroke and congestive heart failure.  PHYSICAL EXAMINATION:  VITAL SIGNS:  Temperature is 98.2, pulse is 71 and regular, blood pressure is 101/74, oxygen saturation is 97% on room air. GENERAL:  This is an obese female in no apparent distress. HEENT:  Normal exam. ABDOMEN:  Soft, nontender.  No hepatosplenomegaly.  Normal bowel sounds. NECK:  There is no thyromegaly, thyroid nodule.  There is no carotid bruit.  There is no JVD. CARDIOVASCULAR:  Heart rate regular.  S1, S2.  No S3, no S4.  There is no murmur.  There are 2+ posterior tibial pulses bilaterally.  There is no peripheral edema and no carotid bruit. EXTREMITIES:  No clubbing or cyanosis. LUNGS:  Distant breath sounds bilaterally. MUSCULOSKELETAL:  Normal exam. NEUROLOGIC:  Alert and oriented x3.  Normal affect. SKIN:  Normal exam.  RADIOLOGY:  CT of the head shows no abnormality.  EKG shows normal sinus rhythm with PACs, otherwise, normal.  LABORATORY DATA:  His white count is 20.9, hematocrit 43.3, platelets 248.  Potassium 3.1, creatinine 0.87.  Cardiac  enzymes negative x1. Urinalysis positive for nitrites, also small leukocytes, however, only 0- 2 white blood cells on analysis.  IMPRESSION:  This is a 70 year old with a history of second-degree atrioventricular block status post pacemaker as well chronic obstructive pulmonary disease on home O2 who presented with syncope likely in the setting of dehydration. 1. Syncope.  I suspect the patient's syncope is likely related to     dehydration and orthostasis.  She has had poor p.o. intake lately.     She says she was orthostatic in her primary care provider's office     on Friday.  Her blood pressure is 101/74 currently, which is     considerably lower than her baseline, which is typically in the     130s systolic.  She took all her blood pressure medications last     night.  She did pass out this morning when she first stood up from     being in bed.  Pacemaker interrogation tonight showed no associated     arrhythmia.  She did have an atrial heart rate during this evening     that appears to be associated with premature atrial contractions.     Pacemaker appears to be working properly.  We will plan to gently     hydrate her.  We will give her 100 mL an hour of normal saline with     500 mL total.  We will check her orthostatics.  We will monitor her     overnight in the hospital and we will hold her blood pressure     medications except for Toprol.  I am also going to hold her Lasix. 2. Chronic obstructive pulmonary disease.  The patient is status post     chronic obstructive pulmonary disease exacerbation.  She is     breathing better after treatment with moxifloxacin and prednisone     last week. 3. Elevated white count.  This may be residual from her prednisone use     last week.  She has had no fever.  Her urinalysis looked dirty but     there are only 0-2 white blood cells on the analysis.  We will     check a urine culture, also get a chest x-ray.     Marca Ancona,  MD     DM/MEDQ  D:  05/11/2011  T:  05/12/2011  Job:  [id number]  Electronically Signed by Marca Ancona MD on 06/04/2011 01:30:31 PM

## 2011-06-08 NOTE — Discharge Summary (Signed)
NAMEVEEDA, VIRGO NO.:  [id number]  MEDICAL RECORD NO.:  [id number]  LOCATION:  2005                         FACILITY:  [facility]  PHYSICIAN:  Hillis Range, MD       DATE OF BIRTH:  06/08/1941  DATE OF ADMISSION:  05/11/2011 DATE OF DISCHARGE:  05/13/2011                              DISCHARGE SUMMARY   PRIMARY CARE PHYSICIAN:  Maryelizabeth Rowan, MD  CARDIOLOGIST:  Hillis Range, MD  PRIMARY DIAGNOSIS:  Postural syncope.  SECONDARY DIAGNOSES: 1. Hypertension. 2. Diabetes. 3. Depression. 4. Hyperlipidemia. 5. Mobitz type II second-degree atrioventricular block with Medtronic     dual chamber pacemaker placed in November 2011. 6. Obesity. 7. Osteoarthritis. 8. Chronic obstructive pulmonary disease on home O2. 9. Tobacco use.  ALLERGIES:  The patient has no known drug allergies.  PROCEDURES THIS ADMISSION: 1. Chest x-ray demonstrated cardiomegaly without edema. 2. CT of the head showed no acute intracranial abnormality.  LABORATORY DATA:  Potassium 3.6, BUN 27, and creatinine 1.36.  White count 16.4, hemoglobin 13.6, and hematocrit 39.4.  Cardiac enzymes negative.  UA positive.  BRIEF HISTORY OF PRESENT ILLNESS:  Ms. Griego is a 69 year old female with a history of COPD on home oxygen, as well as  pacemaker secondary to type 2 second-degree AV block who has been recently treated for upper respiratory infection.  She completed a course of Avelox and prednisone on Saturday before admission.  She had been having symptoms of orthostasis.  On the day of admission, she got up to the door to let her brother-in-law in the house and while  standing at the door, she became lightheaded, passed out and fell.  She hit her head at that time. Because of this, she presented to [facility] for evaluation.  HOSPITAL COURSE:  The patient was admitted and was monitored on telemetry.  This demonstrated atrial pacing.  Her device was interrogated and found to be  functioning normally.  Her amlodipine, losartan, and Lasix were held during this admission.  She was examined by Dr. Johney Frame and it was felt that orthostasis was the likely cause of her syncope along with dehydration.  She was gently hydrated.  Dr. Johney Frame examined the patient on May 13, 2011.  At that time, her dizziness had resolved.  Her urine culture had  come back with no predominant bacteria .  The patient had no symptoms of a UTI.  Pharmacy recommendations included that we place the Celexa at 20 mg because of the recommendations of the FDA for dose not to exceed 20 mg per day in patients greater than 60.  Dr. Johney Frame is leaving this up to her  primary care physician.  The patient was advised no driving for 6 weeks because of her syncopal spells .  On discharge, her amlodipine and Lasix will be held.  We will restart her Losartan and follow up with her  primary care physician.  DISCHARGE INSTRUCTIONS: 1. Increase activity slowly. 2. No driving for 6 weeks. 3. Follow a low-sodium, heart-healthy diet.  FOLLOWUP APPOINTMENTS: 1. Dr. Johney Frame on August 12, 2011, at 10:15 a.m. 2. Dr. Duanne Guess as scheduled.  DISCHARGE MEDICATIONS: 1. Calcium/vitamin D  one tablet daily. 2. Citalopram 40 mg daily. 3. Vitamin D2 50,000 units one tablet on Monday. 4. Fenofibrate 160 mg daily. 5. Lorazepam 0.5 mg 1-2 tablets daily at bedtime as needed. 6. Losartan 100 mg daily. 7. Metformin 1000 mg twice daily. 8. Metoprolol XL succinate 50 mg daily. 9. Oxybutynin XL 10 mg daily. 10.Protonix 40 mg daily. 11.Pravastatin 40 mg daily. 12.ProAir inhaler 2 puffs every 4-6 hours as needed. 13.Spiriva 18 mcg daily. 14.Symbicort 2 puffs twice daily. 15.Vitamin D 2000 units daily.  Of note, the patient's amlodipine and furosemide were discontinued this admission secondary to orthostasis.  DISPOSITION:  The patient was seen and examined by Dr. Johney Frame on May 13, 2011, and considered stable for  discharge.  DURATION OF DISCHARGE ENCOUNTER:  35 minutes.     Gypsy Balsam, RN,BSN   ______________________________ Hillis Range, MD    AS/MEDQ  D:  05/13/2011  T:  05/13/2011  Job:  [id number]  cc:   Maryelizabeth Rowan, M.D.  Electronically Signed by Gypsy Balsam RNBSN on 05/14/2011 02:40:54 PM Electronically Signed by Hillis Range MD on 06/08/2011 09:58:48 AM

## 2011-08-12 ENCOUNTER — Encounter: Payer: Self-pay | Admitting: Internal Medicine

## 2011-08-12 ENCOUNTER — Ambulatory Visit (INDEPENDENT_AMBULATORY_CARE_PROVIDER_SITE_OTHER): Payer: Medicare Other | Admitting: Internal Medicine

## 2011-08-12 VITALS — BP 175/85 | HR 64 | Ht 66.0 in | Wt 253.0 lb

## 2011-08-12 DIAGNOSIS — I441 Atrioventricular block, second degree: Secondary | ICD-10-CM

## 2011-08-12 DIAGNOSIS — I10 Essential (primary) hypertension: Secondary | ICD-10-CM

## 2011-08-12 MED ORDER — AMLODIPINE BESYLATE 5 MG PO TABS: 5.0000 mg | ORAL_TABLET | Freq: Every day | ORAL | Status: DC

## 2011-08-12 NOTE — Patient Instructions (Addendum)
Your physician wants you to follow-up in: 6 months with [name] will receive a reminder letter in the mail two months in advance. If you don't receive a letter, please call our office to schedule the follow-up appointment.  Remote monitoring is used to monitor your Pacemaker of ICD from home. This monitoring reduces the number of office visits required to check your device to one time per year. It allows [place] to keep an eye on the functioning of your device to ensure it is working properly. You are scheduled for a device check from home on 11/12/11. You may send your transmission at any time that day. If you have a wireless device, the transmission will be sent automatically. After your physician reviews your transmission, you will receive a postcard with your next transmission date.   Your physician has recommended you make the following change in your medication:  1) start Norvasc 5mg  daily  2 gram Sodium diet

## 2011-08-12 NOTE — Assessment & Plan Note (Signed)
Restart norvasc 5mg  daily Adequate hydration advised 2 gram sodium diet Avoid NSAIDS

## 2011-08-12 NOTE — Assessment & Plan Note (Signed)
Normal pacemaker function See Pace Art report No changes today  

## 2011-08-12 NOTE — Progress Notes (Signed)
The patient presents today for routine electrophysiology followup.  Since last being seen in our clinic, the patient reports doing very well.  Today, she denies symptoms of palpitations, chest pain, shortness of breath, orthopnea, PND, lower extremity edema, dizziness, presyncope, further syncope, or neurologic sequela.  She was hospitalized June 12 with postural syncope felt to be due to orthostatic hypotension and dehydration.  Norvasc was stopped and her lasix was held.  She has had no further syncope, however her blood pressure has been chronically elevated since that time.  The patient feels that she is tolerating medications without difficulties and is otherwise without complaint today.   Past Medical History  Diagnosis Date  . Pacemaker     Implanted  by Dr Deborah Chalk (MDT) 10/06/10  . Mobitz (type) II atrioventricular block   . HTN (hypertension)   . Diabetes in pregnancy   . Obesity   . Hyperlipidemia   . DJD (degenerative joint disease)   . Asthma   . COPD (chronic obstructive pulmonary disease)   . Pancreatitis    Past Surgical History  Procedure Date  . Insert / replace / remove pacemaker 10/06/10    MDT  implanted by Dr Deborah Chalk  . Total abdominal hysterectomy 1971  . Cystocele repair   . Ovary surgery     removal  . Repair rectocele   . Knee arthroscopy     both  . Pancreatitis     Current Outpatient Prescriptions  Medication Sig Dispense Refill  . albuterol (PROAIR HFA) 108 (90 BASE) MCG/ACT inhaler Inhale 2 puffs into the lungs every 6 (six) hours as needed.        . budesonide-formoterol (SYMBICORT) 160-4.5 MCG/ACT inhaler Inhale 2 puffs into the lungs 2 (two) times daily.        Marland Kitchen buPROPion (WELLBUTRIN XL) 300 MG 24 hr tablet Take 300 mg by mouth. As directed        . Calcium Carbonate-Vitamin D (RA CALCIUM PLUS VITAMIN D) 600-400 MG-UNIT per tablet Take 2 tablets by mouth daily.        . Cholecalciferol (VITAMIN D) 2000 UNITS CAPS Take 1 capsule by mouth daily.         . citalopram (CELEXA) 40 MG tablet Take 40 mg by mouth daily.        . fenofibrate 160 MG tablet Take 160 mg by mouth daily.        . furosemide (LASIX) 20 MG tablet Take 20 mg by mouth daily.        Marland Kitchen losartan (COZAAR) 100 MG tablet Take 100 mg by mouth daily.        . metFORMIN (GLUCOPHAGE) 500 MG tablet Take 1,000 mg by mouth 2 (two) times daily with a meal.       . metoprolol succinate (TOPROL-XL) 25 MG 24 hr tablet Take 50 mg by mouth daily.        Marland Kitchen oxybutynin (DITROPAN-XL) 10 MG 24 hr tablet Take 10 mg by mouth daily.        . pantoprazole (PROTONIX) 40 MG tablet Take 40 mg by mouth daily.        . pravastatin (PRAVACHOL) 40 MG tablet Take 40 mg by mouth daily.        . Probiotic Product (RESTORA PO) Take 1 capsule by mouth daily.        Marland Kitchen tiotropium (SPIRIVA HANDIHALER) 18 MCG inhalation capsule Place 18 mcg into inhaler and inhale daily.        Marland Kitchen amLODipine (  NORVASC) 5 MG tablet Take 1 tablet (5 mg total) by mouth daily.  30 tablet  11    Allergies  Allergen Reactions  . Latex     Some types    History   Social History  . Marital Status: Widowed    Spouse Name: N/A    Number of Children: N/A  . Years of Education: N/A   Occupational History  . Not on file.   Social History Main Topics  . Smoking status: Current Everyday Smoker -- 0.2 packs/day  . Smokeless tobacco: Not on file  . Alcohol Use: Yes  . Drug Use: No  . Sexually Active: Not on file   Other Topics Concern  . Not on file   Social History Narrative  . No narrative on file    Family History  Problem Relation Age of Onset  . Diabetes    . Heart disease Mother     CHF  . Heart disease Father     CHF  . Arthritis Sister     osteo  . Cancer Brother     prostate    ROS-  All systems are reviewed and are negative except as outlined in the HPI above   Physical Exam: Filed Vitals:   08/12/11 1012  BP: 175/85  Pulse: 64  Height: 5\' 6"  (1.676 m)  Weight: 253 lb (114.76 kg)    GEN- The  patient is well appearing, alert and oriented x 3 today.   Head- normocephalic, atraumatic Eyes-  Sclera clear, conjunctiva pink Ears- hearing intact Oropharynx- clear Neck- supple, no JVP Lymph- no cervical lymphadenopathy Lungs- Clear to ausculation bilaterally, normal work of breathing Chest- pacemaker pocket is well healed Heart- Regular rate and rhythm, no murmurs, rubs or gallops, PMI not laterally displaced GI- soft, NT, ND, + BS Extremities- no clubbing, cyanosis, or edema MS- no significant deformity or atrophy Skin- no rash or lesion Psych- euthymic mood, full affect Neuro- strength and sensation are intact  Pacemaker interrogation- reviewed in detail today,  See PACEART report  Assessment and Plan:

## 2011-10-19 ENCOUNTER — Encounter (HOSPITAL_COMMUNITY): Payer: Self-pay | Admitting: Pharmacy Technician

## 2011-10-21 DIAGNOSIS — L219 Seborrheic dermatitis, unspecified: Secondary | ICD-10-CM | POA: Insufficient documentation

## 2011-10-27 ENCOUNTER — Encounter (HOSPITAL_COMMUNITY)
Admission: RE | Admit: 2011-10-27 | Discharge: 2011-10-27 | Disposition: A | Discharge status: Home / Self Care | Payer: Medicare Other | Source: Ambulatory Visit | Attending: Ophthalmology | Admitting: Ophthalmology

## 2011-10-27 ENCOUNTER — Encounter (HOSPITAL_COMMUNITY): Payer: Self-pay

## 2011-10-27 HISTORY — DX: Sleep apnea, unspecified: G47.30

## 2011-10-27 LAB — CBC
MCH: 28.8 pg (ref 26.0–34.0)
Platelets: 272 10*3/uL (ref 150–400)
RBC: 4.52 MIL/uL (ref 3.87–5.11)
WBC: 8.5 10*3/uL (ref 4.0–10.5)

## 2011-10-27 LAB — BASIC METABOLIC PANEL
CO2: 32 mEq/L (ref 19–32)
Calcium: 9.7 mg/dL (ref 8.4–10.5)
Chloride: 99 mEq/L (ref 96–112)
GFR calc Af Amer: 78 mL/min — ABNORMAL LOW (ref >90)
Sodium: 141 mEq/L (ref 135–145)

## 2011-10-27 MED ORDER — CYCLOPENTOLATE-PHENYLEPHRINE 0.2-1 % OP SOLN
OPHTHALMIC | Status: AC
  Filled 2011-10-27: qty 2

## 2011-10-27 NOTE — Patient Instructions (Signed)
20 Brandi Hunter  10/27/2011   Your procedure is scheduled on:  11/02/11  Report to [facility] at 08:00 AM.  Call this number if you have problems the morning of surgery: [phone]   Remember:   Do not eat food:After Midnight.  May have clear liquids:until Midnight .  Clear liquids include soda, tea, black coffee, apple or grape juice, broth.  Take these medicines the morning of surgery with A SIP OF WATER: Amlodipine, Bupropion, Citalopram, Losartan, Metoprolol, Oxybutynin and Protonix. Also, take your inhalers, Spiriva, Albutero, and Symbicort.   Do not wear jewelry, make-up or nail polish.  Do not wear lotions, powders, or perfumes. You may wear deodorant.  Do not shave 48 hours prior to surgery.  Do not bring valuables to the hospital.  Contacts, dentures or bridgework may not be worn into surgery.  Leave suitcase in the car. After surgery it may be brought to your room.  For patients admitted to the hospital, checkout time is 11:00 AM the day of discharge.   Patients discharged the day of surgery will not be allowed to drive home.  Name and phone number of your driver:   Special Instructions: N/A   Please read over the following fact sheets that you were given: Anesthesia Post-op Instructions    Cataract A cataract is a clouding of the lens of the eye. It is most often related to aging. A cataract is not a "film" over the surface of the eye. The lens is inside the eye and changes size of the pupil. The lens can enlarge to let more light enter the eye in dark environments and contract the size of the pupil to let in bright light. The lens is the part of the eye that helps focus light on the retina. The retina is the eye's light-sensitive layer. It is in the back of the eye that sends visual signals to the brain. In a normal eye, light passes through the lens and gets focused on the retina. To help produce a sharp image, the lens must remain clear. When a lens becomes cloudy, vision  is compromised by the degree and nature of the clouding. Certain cataracts make people more near-sighted as they develop, others increase glare, and all reduce vision to some degree or another. A cataract that is so dense that it becomes milky Boyd Litaker and a Nihira Puello opacity can be seen through the pupil. When the Rumaysa Sabatino color is seen, it is called a "mature" or "hyper-mature cataract." Such cataracts cause total blindness in the affected eye. The cataract must be removed to prevent damage to the eye itself. Some types of cataracts can cause a secondary disease of the eye, such as certain types of glaucoma. In the early stages, better lighting and eyeglasses may lessen vision problems caused by cataracts. At a certain point, surgery may be needed to improve vision. CAUSES   Aging. However, cataracts may occur at any age, even in newborns.   Certain drugs.   Trauma to the eye.   Certain diseases (such as diabetes).   Inherited or acquired medical syndromes.  SYMPTOMS   Gradual, progressive drop in vision in the affected eye. Cataracts may develop at different rates in each eye. Cataracts may even be in just one eye with the other unaffected.   Cataracts due to trauma may develop quickly, sometimes over a matter or days or even hours. The result is severe and rapid visual loss.  DIAGNOSIS  To detect a cataract, an eye  doctor examines the lens. A well developed cataract can be diagnosed without dilating the pupil. Early cataracts and others of a specific nature are best diagnosed with an exam of the eyes with the pupils dilated by drops. TREATMENT   For an early cataract, vision may improve by using different eyeglasses or stronger lighting.   If the above measures do not help, surgery is the only effective treatment. This treatment removes the cloudy lens and replaces it with a substitute lens (Intraocular lens, or IOL). Newly developed IOL technology allows the implanted lens to improve vision both  at a distance and up close. Discuss with your eye surgeon about the possibility of still needing glasses. Also discuss how visual coordination between both eyes will be affected.  A cataract needs to be removed only when vision loss interferes with your everyday activities such as driving, reading or watching TV. You and your eye doctor can make that decision together. In most cases, waiting until you are ready to have cataract surgery will not harm your eye. If you have cataracts in both eyes, only one should be removed at a time. This allows the operated eye to heal and be out of danger from serious problems (such as infection or poor wound healing) before having the other eye undergo surgery.  Sometimes, a cataract should be removed even if it does not cause problems with your vision. For example, a cataract should be removed if it prevents examination or treatment of another eye problem. Just as you cannot see out of the affected eye well, your doctor cannot see into your eye well through a cataract. The vast majority of people who have cataract surgery have better vision afterward. CATARACT REMOVAL There are two primary ways to remove a cataract. Your doctor can explain the differences and help determine which is best for you:  Phacoemulsification (small incision cataract surgery). This involves making a small cut (incision) on the edge of the clear, dome-shaped surface that covers the front of the eye (the cornea). An injection behind the eye or eye drops are given to make this a painless procedure. The doctor then inserts a tiny probe into the eye. This device emits ultrasound waves that soften and break up the cloudy center of the lens so it can be removed by suction. Most cataract surgery is done this way. The cuts are usually so small and performed in such a manner that often no sutures are needed to keep it closed.   Extracapsular surgery. Your doctor makes a slightly longer incision on the side  of the cornea. The doctor removes the hard center of the lens. The remainder of the lens is then removed by suction. In some cases, extremely fine sutures are needed which the doctor may, or may not remove in the office after the surgery.  When an IOL is implanted, it needs no care. It becomes a permanent part of your eye and cannot be seen or felt.  Some people cannot have an IOL. They may have problems during surgery, or maybe they have another eye disease. For these people, a soft contact lens may be suggested. If an IOL or contact lens cannot be used, very powerful and thick glasses are required after surgery. Since vision is very different through such thick glasses, it is important to have your doctor discuss the impact on your vision after any cataract surgery where there is no plan to implant an IOL. The normal lens of the eye is covered by  a clear capsule. Both phacoemulsification and extracapsular surgery require that the back surface of this lens capsule be left in place. This helps support IOLs and prevents the IOL from dislocating and falling back into the deeper interior of the eye. Right after surgery, and often permanently this "posterior capsule" remains clear. In some cases however, it can become cloudy, presenting the same type of visual compromise that the original cataract did since light is again obstructed as it passes through the clear IOL. This condition is often referred to as an "after-cataract." Fortunately, after-cataracts are easily treated using a painless and very fast laser treatment that is performed without anesthesia or incisions. It is done in a matter of minutes in an outpatient environment. Visual improvement is often immediate.  HOME CARE INSTRUCTIONS   Your surgeon will discuss pre and post operative care with you prior to surgery. The majority of people are able to do almost all normal activities right away. Although, it is often advised to avoid strenuous activity for  a period of time.   Postoperative drops and careful avoidance of infection will be needed. Many surgeons suggest the use of a protective shield during the first few days after surgery.   There is a very small incidence of complication from modern cataract surgery, but it can happen. Infection that spreads to the inside of the eye (endophthalmitis) can result in total visual loss and even loss of the eye itself. In extremely rare instances, the inflammation of endophthalmitis can spread to both eyes (sympathetic ophthalmia). Appropriate post-operative care under the close observation of your surgeon is essential to a successful outcome.  SEEK IMMEDIATE MEDICAL CARE IF:   You have any sudden drop of vision in the operated eye.   You have pain in the operated eye.   You see a large number of floating dots in the field of vision in the operated eye.   You see flashing lights, or if a portion of your side vision in any direction appears black (like a curtain being drawn into your field of vision) in the operated eye.  Document Released: 11/02/2005 Document Revised: 07/15/2011 Document Reviewed: 12/19/2007 [facility] Patient Information 2012 [place], [place].   PATIENT INSTRUCTIONS POST-ANESTHESIA  IMMEDIATELY FOLLOWING SURGERY:  Do not drive or operate machinery for the first twenty four hours after surgery.  Do not make any important decisions for twenty four hours after surgery or while taking narcotic pain medications or sedatives.  If you develop intractable nausea and vomiting or a severe headache please notify your doctor immediately.  FOLLOW-UP:  Please make an appointment with your surgeon as instructed. You do not need to follow up with anesthesia unless specifically instructed to do so.  WOUND CARE INSTRUCTIONS (if applicable):  Keep a dry clean dressing on the anesthesia/puncture wound site if there is drainage.  Once the wound has quit draining you may leave it open to air.  Generally  you should leave the bandage intact for twenty four hours unless there is drainage.  If the epidural site drains for more than 36-48 hours please call the anesthesia department.  QUESTIONS?:  Please feel free to call your physician or the hospital operator if you have any questions, and they will be happy to assist you.     [facility] Anesthesia Department [address] [place] [phone]

## 2011-11-02 ENCOUNTER — Encounter (HOSPITAL_COMMUNITY)
Admission: RE | Disposition: A | Discharge status: Home / Self Care | Payer: Self-pay | Source: Ambulatory Visit | Attending: Ophthalmology

## 2011-11-02 ENCOUNTER — Ambulatory Visit (HOSPITAL_COMMUNITY)
Admission: RE | Admit: 2011-11-02 | Discharge: 2011-11-02 | Disposition: A | Discharge status: Home / Self Care | Payer: Medicare Other | Source: Ambulatory Visit | Attending: Ophthalmology | Admitting: Ophthalmology

## 2011-11-02 ENCOUNTER — Encounter (HOSPITAL_COMMUNITY): Payer: Self-pay | Admitting: Anesthesiology

## 2011-11-02 ENCOUNTER — Encounter (HOSPITAL_COMMUNITY): Payer: Self-pay | Admitting: *Deleted

## 2011-11-02 ENCOUNTER — Ambulatory Visit (HOSPITAL_COMMUNITY): Payer: Medicare Other | Admitting: Anesthesiology

## 2011-11-02 DIAGNOSIS — E119 Type 2 diabetes mellitus without complications: Secondary | ICD-10-CM | POA: Insufficient documentation

## 2011-11-02 DIAGNOSIS — Z79899 Other long term (current) drug therapy: Secondary | ICD-10-CM | POA: Insufficient documentation

## 2011-11-02 DIAGNOSIS — J449 Chronic obstructive pulmonary disease, unspecified: Secondary | ICD-10-CM | POA: Insufficient documentation

## 2011-11-02 DIAGNOSIS — H2510 Age-related nuclear cataract, unspecified eye: Secondary | ICD-10-CM | POA: Insufficient documentation

## 2011-11-02 DIAGNOSIS — J4489 Other specified chronic obstructive pulmonary disease: Secondary | ICD-10-CM | POA: Insufficient documentation

## 2011-11-02 DIAGNOSIS — Z01812 Encounter for preprocedural laboratory examination: Secondary | ICD-10-CM | POA: Insufficient documentation

## 2011-11-02 DIAGNOSIS — I10 Essential (primary) hypertension: Secondary | ICD-10-CM | POA: Insufficient documentation

## 2011-11-02 HISTORY — PX: CATARACT EXTRACTION W/PHACO: SHX586

## 2011-11-02 LAB — GLUCOSE, CAPILLARY: Glucose-Capillary: 161 mg/dL — ABNORMAL HIGH (ref 70–99)

## 2011-11-02 SURGERY — PHACOEMULSIFICATION, CATARACT, WITH IOL INSERTION
Anesthesia: Monitor Anesthesia Care | Site: Eye | Laterality: Right | Wound class: Clean

## 2011-11-02 MED ORDER — LIDOCAINE HCL 3.5 % OP GEL
OPHTHALMIC | Status: AC
  Filled 2011-11-02: qty 5

## 2011-11-02 MED ORDER — CYCLOPENTOLATE-PHENYLEPHRINE 0.2-1 % OP SOLN
1.0000 [drp] | Freq: Once | OPHTHALMIC | Status: AC
  Administered 2011-11-02: 1 [drp] via OPHTHALMIC

## 2011-11-02 MED ORDER — EPINEPHRINE HCL 1 MG/ML IJ SOLN
INTRAOCULAR | Status: DC | PRN
  Administered 2011-11-02: 10:00:00

## 2011-11-02 MED ORDER — EPINEPHRINE HCL 1 MG/ML IJ SOLN
INTRAMUSCULAR | Status: AC
  Filled 2011-11-02: qty 1

## 2011-11-02 MED ORDER — LACTATED RINGERS IV SOLN
INTRAVENOUS | Status: DC
  Administered 2011-11-02: 09:00:00 via INTRAVENOUS

## 2011-11-02 MED ORDER — BSS IO SOLN
INTRAOCULAR | Status: DC | PRN
  Administered 2011-11-02: 15 mL via INTRAOCULAR

## 2011-11-02 MED ORDER — LIDOCAINE HCL 3.5 % OP GEL
OPHTHALMIC | Status: AC
  Administered 2011-11-02: 1 via OPHTHALMIC
  Filled 2011-11-02: qty 5

## 2011-11-02 MED ORDER — TETRACAINE 0.5 % OP SOLN OPTIME - NO CHARGE
OPHTHALMIC | Status: DC | PRN
  Administered 2011-11-02: 1 [drp] via OPHTHALMIC

## 2011-11-02 MED ORDER — KETOROLAC TROMETHAMINE 0.4 % OP SOLN - NO CHARGE
1.0000 [drp] | Freq: Once | OPHTHALMIC | Status: AC
  Administered 2011-11-02: 1 [drp] via OPHTHALMIC
  Filled 2011-11-02: qty 5

## 2011-11-02 MED ORDER — MIDAZOLAM HCL 2 MG/2ML IJ SOLN
INTRAMUSCULAR | Status: AC
  Administered 2011-11-02: 2 mg via INTRAVENOUS
  Filled 2011-11-02: qty 2

## 2011-11-02 MED ORDER — NA HYALUR & NA CHOND-NA HYALUR 0.55-0.5 ML IO KIT
PACK | INTRAOCULAR | Status: DC | PRN
  Administered 2011-11-02: 1 via OPHTHALMIC

## 2011-11-02 MED ORDER — MIDAZOLAM HCL 2 MG/2ML IJ SOLN
1.0000 mg | INTRAMUSCULAR | Status: DC | PRN
  Administered 2011-11-02: 2 mg via INTRAVENOUS

## 2011-11-02 MED ORDER — TETRACAINE HCL 0.5 % OP SOLN
1.0000 [drp] | OPHTHALMIC | Status: AC
  Administered 2011-11-02 (×3): 1 [drp] via OPHTHALMIC

## 2011-11-02 MED ORDER — TETRACAINE HCL 0.5 % OP SOLN
OPHTHALMIC | Status: AC
  Administered 2011-11-02: 1 [drp] via OPHTHALMIC
  Filled 2011-11-02: qty 2

## 2011-11-02 MED ORDER — CYCLOPENTOLATE-PHENYLEPHRINE 0.2-1 % OP SOLN: 1.0000 [drp] | OPHTHALMIC | Status: DC

## 2011-11-02 MED ORDER — LIDOCAINE HCL 3.5 % OP GEL
1.0000 "application " | Freq: Once | OPHTHALMIC | Status: AC
  Administered 2011-11-02: 1 via OPHTHALMIC

## 2011-11-02 MED ORDER — GATIFLOXACIN 0.5 % OP SOLN OPTIME - NO CHARGE
1.0000 [drp] | Freq: Once | OPHTHALMIC | Status: AC
  Administered 2011-11-02: 1 [drp] via OPHTHALMIC
  Filled 2011-11-02: qty 2.5

## 2011-11-02 MED ORDER — LIDOCAINE 3.5 % OP GEL OPTIME - NO CHARGE
OPHTHALMIC | Status: DC | PRN
  Administered 2011-11-02: 1 [drp] via OPHTHALMIC

## 2011-11-02 SURGICAL SUPPLY — 29 items
CAPSULAR TENSION RING-AMO (OPHTHALMIC RELATED) IMPLANT
CLOTH BEACON ORANGE TIMEOUT ST (SAFETY) ×1 IMPLANT
GLOVE BIO SURGEON STRL SZ7.5 (GLOVE) IMPLANT
GLOVE BIOGEL M 6.5 STRL (GLOVE) IMPLANT
GLOVE BIOGEL PI IND STRL 6.5 (GLOVE) IMPLANT
GLOVE BIOGEL PI IND STRL 7.0 (GLOVE) IMPLANT
GLOVE BIOGEL PI INDICATOR 6.5 (GLOVE) ×1
GLOVE BIOGEL PI INDICATOR 7.0 (GLOVE)
GLOVE ECLIPSE 6.5 STRL STRAW (GLOVE) IMPLANT
GLOVE ECLIPSE 7.5 STRL STRAW (GLOVE) IMPLANT
GLOVE EXAM NITRILE LRG STRL (GLOVE) IMPLANT
GLOVE EXAM NITRILE MD LF STRL (GLOVE) ×1 IMPLANT
GLOVE SKINSENSE NS SZ6.5 (GLOVE)
GLOVE SKINSENSE NS SZ7.0 (GLOVE)
GLOVE SKINSENSE NS SZ7.5 (GLOVE) ×1
GLOVE SKINSENSE STRL SZ6.5 (GLOVE) IMPLANT
GLOVE SKINSENSE STRL SZ7.0 (GLOVE) IMPLANT
GLOVE SKINSENSE STRL SZ7.5 (GLOVE) IMPLANT
INST SET CATARACT ~~LOC~~ (KITS) ×2 IMPLANT
KIT VITRECTOMY (OPHTHALMIC RELATED) IMPLANT
PAD ARMBOARD 7.5X6 YLW CONV (MISCELLANEOUS) ×1 IMPLANT
PROC W NO LENS (INTRAOCULAR LENS)
PROC W SPEC LENS (INTRAOCULAR LENS)
PROCESS W NO LENS (INTRAOCULAR LENS) IMPLANT
PROCESS W SPEC LENS (INTRAOCULAR LENS) IMPLANT
RING MALYGIN (MISCELLANEOUS) IMPLANT
SIGHTPATH CAT PROC W REG LENS (Ophthalmic Related) ×2 IMPLANT
VISCOELASTIC ADDITIONAL (OPHTHALMIC RELATED) IMPLANT
WATER STERILE IRR 250ML POUR (IV SOLUTION) ×1 IMPLANT

## 2011-11-02 NOTE — Transfer of Care (Signed)
Immediate Anesthesia Transfer of Care Note  Patient: Brandi Hunter  Procedure(s) Performed:  CATARACT EXTRACTION PHACO AND INTRAOCULAR LENS PLACEMENT (IOC) - CDE=7.33  Patient Location: PACU and Short Stay  Anesthesia Type: MAC  Level of Consciousness: awake, alert , oriented and patient cooperative  Airway & Oxygen Therapy: Patient Spontanous Breathing  Post-op Assessment: Report given to PACU RN, Post -op Vital signs reviewed and stable and Patient moving all extremities X 4  Post vital signs: Reviewed and stable  Complications: No apparent anesthesia complications

## 2011-11-02 NOTE — Anesthesia Preprocedure Evaluation (Signed)
Anesthesia Evaluation  Patient identified by MRN, date of birth, ID band Patient awake    Reviewed: Allergy & Precautions  Airway Mallampati: I      Dental  (+) Edentulous Upper and Edentulous Lower   Pulmonary asthma , sleep apnea and Continuous Positive Airway Pressure Ventilation , COPD   Pulmonary exam normal       Cardiovascular hypertension, Pt. on medications + dysrhythmias + pacemaker Regular Normal    Neuro/Psych    GI/Hepatic   Endo/Other  Diabetes mellitus-, Well Controlled, Oral Hypoglycemic Agents  Renal/GU      Musculoskeletal   Abdominal   Peds  Hematology   Anesthesia Other Findings   Reproductive/Obstetrics                           Anesthesia Physical Anesthesia Plan  ASA: III  Anesthesia Plan: MAC   Post-op Pain Management:    Induction: Intravenous  Airway Management Planned: Nasal Cannula  Additional Equipment:   Intra-op Plan:   Post-operative Plan:   Informed Consent: I have reviewed the patients History and Physical, chart, labs and discussed the procedure including the risks, benefits and alternatives for the proposed anesthesia with the patient or authorized representative who has indicated his/her understanding and acceptance.     Plan Discussed with:   Anesthesia Plan Comments:         Anesthesia Quick Evaluation

## 2011-11-02 NOTE — H&P (Signed)
Pt interviewed and examined without significant changes since original H&P.

## 2011-11-02 NOTE — Anesthesia Postprocedure Evaluation (Signed)
  Anesthesia Post-op Note  Patient: Brandi Hunter  Procedure(s) Performed:  CATARACT EXTRACTION PHACO AND INTRAOCULAR LENS PLACEMENT (IOC) - CDE=7.33  Patient Location: PACU and Short Stay  Anesthesia Type: MAC  Level of Consciousness: awake, alert , oriented and patient cooperative  Airway and Oxygen Therapy: Patient Spontanous Breathing  Post-op Pain: none  Post-op Assessment: Post-op Vital signs reviewed, Patient's Cardiovascular Status Stable, Respiratory Function Stable, Patent Airway and No signs of Nausea or vomiting  Post-op Vital Signs: Reviewed and stable  Complications: No apparent anesthesia complications

## 2011-11-02 NOTE — Op Note (Signed)
See scanned op note from other system.  

## 2011-11-02 NOTE — Brief Op Note (Signed)
11/02/2011  11:21 AM  PATIENT:  Brandi Hunter  69 y.o. female  PRE-OPERATIVE DIAGNOSIS:  Surgical cataract right eye  POST-OPERATIVE DIAGNOSIS:  Surgical Cataract Right Eye  PROCEDURE:  Procedure(s): CATARACT EXTRACTION PHACO AND INTRAOCULAR LENS PLACEMENT (IOC)  SURGEON:  Surgeon(s): Susa Simmonds  ASSISTANTS: Marya Landry, CST   ANESTHESIA STAFF: Despina Hidden - CRNA Laurene Footman, MD - Anesthesiologist  ANESTHESIA:   topical/MAC  REQUESTED LENS POWER: 18.0  LENS IMPLANT INFORMATION:  [employer] [id number]  +18.0 s/n [id number]  Exp 06/17  CUMULATIVE DISSIPATED ENERGY:7.33  INDICATIONS:decreased visual acuity interfering with daily activities  OP FINDINGS:dense NS  COMPLICATIONS:None  DICTATION #: see scanned note from other system  PLAN OF CARE: to short stay  PATIENT DISPOSITION:  Short Stay

## 2011-11-05 ENCOUNTER — Encounter (HOSPITAL_COMMUNITY): Payer: Self-pay | Admitting: Ophthalmology

## 2011-11-12 ENCOUNTER — Encounter: Payer: Medicare Other | Admitting: *Deleted

## 2011-11-16 ENCOUNTER — Encounter: Payer: Self-pay | Admitting: *Deleted

## 2011-11-18 ENCOUNTER — Encounter: Payer: Self-pay | Admitting: *Deleted

## 2011-11-26 ENCOUNTER — Ambulatory Visit (INDEPENDENT_AMBULATORY_CARE_PROVIDER_SITE_OTHER): Payer: Medicare Other | Admitting: *Deleted

## 2011-11-26 ENCOUNTER — Encounter: Payer: Self-pay | Admitting: Internal Medicine

## 2011-11-26 ENCOUNTER — Other Ambulatory Visit: Payer: Self-pay | Admitting: Internal Medicine

## 2011-11-26 ENCOUNTER — Encounter (HOSPITAL_COMMUNITY): Payer: Self-pay | Admitting: Pharmacy Technician

## 2011-11-26 DIAGNOSIS — I441 Atrioventricular block, second degree: Secondary | ICD-10-CM

## 2011-11-29 LAB — REMOTE PACEMAKER DEVICE
ATRIAL PACING PM: 28
BAMS-0001: 165 {beats}/min
BATTERY VOLTAGE: 2.79 V
RV LEAD THRESHOLD: 0.625 V
VENTRICULAR PACING PM: 47

## 2011-11-30 ENCOUNTER — Encounter: Payer: Self-pay | Admitting: *Deleted

## 2011-12-01 ENCOUNTER — Encounter (HOSPITAL_COMMUNITY)
Admission: RE | Admit: 2011-12-01 | Discharge: 2011-12-01 | Disposition: A | Discharge status: Home / Self Care | Payer: Medicare Other | Source: Ambulatory Visit | Attending: Ophthalmology | Admitting: Ophthalmology

## 2011-12-01 ENCOUNTER — Encounter (HOSPITAL_COMMUNITY): Payer: Self-pay

## 2011-12-01 MED ORDER — CYCLOPENTOLATE-PHENYLEPHRINE 0.2-1 % OP SOLN
OPHTHALMIC | Status: AC
  Filled 2011-12-01: qty 2

## 2011-12-01 NOTE — Patient Instructions (Addendum)
20 Brandi Hunter  12/01/2011   Your procedure is scheduled on: 01/ 21/ 2013  Report to [facility] at 0730 AM.  Call this number if you have problems the morning of surgery: [phone]   Remember:   Do not eat food:After Midnight.  May have clear liquids:until Midnight .  Clear liquids include soda, tea, black coffee, apple or grape juice, broth.  Take these medicines the morning of surgery with A SIP OF WATER: Use all inhalers as needed, wellbutrin, celebrex, cozaar, toprol, ditropan, and protonix on morning of surgery with a sip             of water only.  Do not wear jewelry, make-up or nail polish.  Do not wear lotions, powders, or perfumes. You may wear deodorant.  Do not shave 48 hours prior to surgery.  Do not bring valuables to the hospital.  Contacts, dentures or bridgework may not be worn into surgery.  Leave suitcase in the car. After surgery it may be brought to your room.  For patients admitted to the hospital, checkout time is 11:00 AM the day of discharge.   Patients discharged the day of surgery will not be allowed to drive home.  Name and phone number of your driver:   Special Instructions: N/A   Please read over the following fact sheets that you were given: Anesthesia Post-op Instructions    Cataract A cataract is a clouding of the lens of the eye. It is most often related to aging. A cataract is not a "film" over the surface of the eye. The lens is inside the eye and changes size of the pupil. The lens can enlarge to let more light enter the eye in dark environments and contract the size of the pupil to let in bright light. The lens is the part of the eye that helps focus light on the retina. The retina is the eye's light-sensitive layer. It is in the back of the eye that sends visual signals to the brain. In a normal eye, light passes through the lens and gets focused on the retina. To help produce a sharp image, the lens must remain clear. When a lens becomes cloudy,  vision is compromised by the degree and nature of the clouding. Certain cataracts make people more near-sighted as they develop, others increase glare, and all reduce vision to some degree or another. A cataract that is so dense that it becomes milky white and a white opacity can be seen through the pupil. When the white color is seen, it is called a "mature" or "hyper-mature cataract." Such cataracts cause total blindness in the affected eye. The cataract must be removed to prevent damage to the eye itself. Some types of cataracts can cause a secondary disease of the eye, such as certain types of glaucoma. In the early stages, better lighting and eyeglasses may lessen vision problems caused by cataracts. At a certain point, surgery may be needed to improve vision. CAUSES   Aging. However, cataracts may occur at any age, even in newborns.   Certain drugs.   Trauma to the eye.   Certain diseases (such as diabetes).   Inherited or acquired medical syndromes.  SYMPTOMS   Gradual, progressive drop in vision in the affected eye. Cataracts may develop at different rates in each eye. Cataracts may even be in just one eye with the other unaffected.   Cataracts due to trauma may develop quickly, sometimes over a matter or days or even  hours. The result is severe and rapid visual loss.  DIAGNOSIS  To detect a cataract, an eye doctor examines the lens. A well developed cataract can be diagnosed without dilating the pupil. Early cataracts and others of a specific nature are best diagnosed with an exam of the eyes with the pupils dilated by drops. TREATMENT   For an early cataract, vision may improve by using different eyeglasses or stronger lighting.   If the above measures do not help, surgery is the only effective treatment. This treatment removes the cloudy lens and replaces it with a substitute lens (Intraocular lens, or IOL). Newly developed IOL technology allows the implanted lens to improve vision  both at a distance and up close. Discuss with your eye surgeon about the possibility of still needing glasses. Also discuss how visual coordination between both eyes will be affected.  A cataract needs to be removed only when vision loss interferes with your everyday activities such as driving, reading or watching TV. You and your eye doctor can make that decision together. In most cases, waiting until you are ready to have cataract surgery will not harm your eye. If you have cataracts in both eyes, only one should be removed at a time. This allows the operated eye to heal and be out of danger from serious problems (such as infection or poor wound healing) before having the other eye undergo surgery.  Sometimes, a cataract should be removed even if it does not cause problems with your vision. For example, a cataract should be removed if it prevents examination or treatment of another eye problem. Just as you cannot see out of the affected eye well, your doctor cannot see into your eye well through a cataract. The vast majority of people who have cataract surgery have better vision afterward. CATARACT REMOVAL There are two primary ways to remove a cataract. Your doctor can explain the differences and help determine which is best for you:  Phacoemulsification (small incision cataract surgery). This involves making a small cut (incision) on the edge of the clear, dome-shaped surface that covers the front of the eye (the cornea). An injection behind the eye or eye drops are given to make this a painless procedure. The doctor then inserts a tiny probe into the eye. This device emits ultrasound waves that soften and break up the cloudy center of the lens so it can be removed by suction. Most cataract surgery is done this way. The cuts are usually so small and performed in such a manner that often no sutures are needed to keep it closed.   Extracapsular surgery. Your doctor makes a slightly longer incision on the  side of the cornea. The doctor removes the hard center of the lens. The remainder of the lens is then removed by suction. In some cases, extremely fine sutures are needed which the doctor may, or may not remove in the office after the surgery.  When an IOL is implanted, it needs no care. It becomes a permanent part of your eye and cannot be seen or felt.  Some people cannot have an IOL. They may have problems during surgery, or maybe they have another eye disease. For these people, a soft contact lens may be suggested. If an IOL or contact lens cannot be used, very powerful and thick glasses are required after surgery. Since vision is very different through such thick glasses, it is important to have your doctor discuss the impact on your vision after any cataract surgery  where there is no plan to implant an IOL. The normal lens of the eye is covered by a clear capsule. Both phacoemulsification and extracapsular surgery require that the back surface of this lens capsule be left in place. This helps support IOLs and prevents the IOL from dislocating and falling back into the deeper interior of the eye. Right after surgery, and often permanently this "posterior capsule" remains clear. In some cases however, it can become cloudy, presenting the same type of visual compromise that the original cataract did since light is again obstructed as it passes through the clear IOL. This condition is often referred to as an "after-cataract." Fortunately, after-cataracts are easily treated using a painless and very fast laser treatment that is performed without anesthesia or incisions. It is done in a matter of minutes in an outpatient environment. Visual improvement is often immediate.  HOME CARE INSTRUCTIONS   Your surgeon will discuss pre and post operative care with you prior to surgery. The majority of people are able to do almost all normal activities right away. Although, it is often advised to avoid strenuous  activity for a period of time.   Postoperative drops and careful avoidance of infection will be needed. Many surgeons suggest the use of a protective shield during the first few days after surgery.   There is a very small incidence of complication from modern cataract surgery, but it can happen. Infection that spreads to the inside of the eye (endophthalmitis) can result in total visual loss and even loss of the eye itself. In extremely rare instances, the inflammation of endophthalmitis can spread to both eyes (sympathetic ophthalmia). Appropriate post-operative care under the close observation of your surgeon is essential to a successful outcome.  SEEK IMMEDIATE MEDICAL CARE IF:   You have any sudden drop of vision in the operated eye.   You have pain in the operated eye.   You see a large number of floating dots in the field of vision in the operated eye.   You see flashing lights, or if a portion of your side vision in any direction appears black (like a curtain being drawn into your field of vision) in the operated eye.  Document Released: 11/02/2005 Document Revised: 07/15/2011 Document Reviewed: 12/19/2007 [facility] Patient Information 2012 [place], [place].   PATIENT INSTRUCTIONS POST-ANESTHESIA  IMMEDIATELY FOLLOWING SURGERY:  Do not drive or operate machinery for the first twenty four hours after surgery.  Do not make any important decisions for twenty four hours after surgery or while taking narcotic pain medications or sedatives.  If you develop intractable nausea and vomiting or a severe headache please notify your doctor immediately.  FOLLOW-UP:  Please make an appointment with your surgeon as instructed. You do not need to follow up with anesthesia unless specifically instructed to do so.  WOUND CARE INSTRUCTIONS (if applicable):  Keep a dry clean dressing on the anesthesia/puncture wound site if there is drainage.  Once the wound has quit draining you may leave it open to air.   Generally you should leave the bandage intact for twenty four hours unless there is drainage.  If the epidural site drains for more than 36-48 hours please call the anesthesia department.  QUESTIONS?:  Please feel free to call your physician or the hospital operator if you have any questions, and they will be happy to assist you.     [facility] Anesthesia Department [address] [place] [phone]

## 2011-12-02 NOTE — Progress Notes (Signed)
PPM remote 

## 2011-12-07 ENCOUNTER — Ambulatory Visit (HOSPITAL_COMMUNITY): Payer: Medicare Other | Admitting: Anesthesiology

## 2011-12-07 ENCOUNTER — Encounter (HOSPITAL_COMMUNITY): Payer: Self-pay | Admitting: *Deleted

## 2011-12-07 ENCOUNTER — Ambulatory Visit (HOSPITAL_COMMUNITY)
Admission: RE | Admit: 2011-12-07 | Discharge: 2011-12-07 | Disposition: A | Discharge status: Home / Self Care | Payer: Medicare Other | Source: Ambulatory Visit | Attending: Ophthalmology | Admitting: Ophthalmology

## 2011-12-07 ENCOUNTER — Encounter (HOSPITAL_COMMUNITY)
Admission: RE | Disposition: A | Discharge status: Home / Self Care | Payer: Self-pay | Source: Ambulatory Visit | Attending: Ophthalmology

## 2011-12-07 ENCOUNTER — Encounter (HOSPITAL_COMMUNITY): Payer: Self-pay | Admitting: Anesthesiology

## 2011-12-07 DIAGNOSIS — Z01812 Encounter for preprocedural laboratory examination: Secondary | ICD-10-CM | POA: Insufficient documentation

## 2011-12-07 DIAGNOSIS — E119 Type 2 diabetes mellitus without complications: Secondary | ICD-10-CM | POA: Insufficient documentation

## 2011-12-07 DIAGNOSIS — Z950 Presence of cardiac pacemaker: Secondary | ICD-10-CM | POA: Insufficient documentation

## 2011-12-07 DIAGNOSIS — Z79899 Other long term (current) drug therapy: Secondary | ICD-10-CM | POA: Insufficient documentation

## 2011-12-07 DIAGNOSIS — J449 Chronic obstructive pulmonary disease, unspecified: Secondary | ICD-10-CM | POA: Insufficient documentation

## 2011-12-07 DIAGNOSIS — J4489 Other specified chronic obstructive pulmonary disease: Secondary | ICD-10-CM | POA: Insufficient documentation

## 2011-12-07 DIAGNOSIS — H2510 Age-related nuclear cataract, unspecified eye: Secondary | ICD-10-CM | POA: Insufficient documentation

## 2011-12-07 DIAGNOSIS — I10 Essential (primary) hypertension: Secondary | ICD-10-CM | POA: Insufficient documentation

## 2011-12-07 HISTORY — PX: CATARACT EXTRACTION W/PHACO: SHX586

## 2011-12-07 LAB — GLUCOSE, CAPILLARY: Glucose-Capillary: 151 mg/dL — ABNORMAL HIGH (ref 70–99)

## 2011-12-07 SURGERY — PHACOEMULSIFICATION, CATARACT, WITH IOL INSERTION
Anesthesia: Monitor Anesthesia Care | Site: Eye | Laterality: Left | Wound class: Clean

## 2011-12-07 MED ORDER — GATIFLOXACIN 0.5 % OP SOLN OPTIME - NO CHARGE
OPHTHALMIC | Status: DC | PRN
  Administered 2011-12-07: 1 [drp] via OPHTHALMIC

## 2011-12-07 MED ORDER — EPINEPHRINE HCL 1 MG/ML IJ SOLN
INTRAOCULAR | Status: DC | PRN
  Administered 2011-12-07: 10:00:00

## 2011-12-07 MED ORDER — MIDAZOLAM HCL 2 MG/2ML IJ SOLN
INTRAMUSCULAR | Status: AC
  Administered 2011-12-07: 2 mg via INTRAVENOUS
  Filled 2011-12-07: qty 2

## 2011-12-07 MED ORDER — BSS IO SOLN
INTRAOCULAR | Status: DC | PRN
  Administered 2011-12-07: 15 mL via INTRAOCULAR

## 2011-12-07 MED ORDER — EPINEPHRINE HCL 1 MG/ML IJ SOLN
INTRAMUSCULAR | Status: AC
  Filled 2011-12-07: qty 1

## 2011-12-07 MED ORDER — ONDANSETRON HCL 4 MG/2ML IJ SOLN: 4.0000 mg | Freq: Once | INTRAMUSCULAR | Status: DC | PRN

## 2011-12-07 MED ORDER — LACTATED RINGERS IV SOLN
INTRAVENOUS | Status: DC
  Administered 2011-12-07: 10:00:00 via INTRAVENOUS

## 2011-12-07 MED ORDER — TETRACAINE HCL 0.5 % OP SOLN
1.0000 [drp] | Freq: Once | OPHTHALMIC | Status: AC
  Administered 2011-12-07: 1 [drp] via OPHTHALMIC

## 2011-12-07 MED ORDER — KETOROLAC TROMETHAMINE 0.5 % OP SOLN: 1.0000 [drp] | Freq: Once | OPHTHALMIC | Status: DC

## 2011-12-07 MED ORDER — LIDOCAINE 3.5 % OP GEL OPTIME - NO CHARGE
OPHTHALMIC | Status: DC | PRN
  Administered 2011-12-07: 2 [drp] via OPHTHALMIC

## 2011-12-07 MED ORDER — TETRACAINE HCL 0.5 % OP SOLN
OPHTHALMIC | Status: AC
  Administered 2011-12-07: 1 [drp] via OPHTHALMIC
  Filled 2011-12-07: qty 2

## 2011-12-07 MED ORDER — LIDOCAINE HCL 3.5 % OP GEL
OPHTHALMIC | Status: AC
  Filled 2011-12-07: qty 5

## 2011-12-07 MED ORDER — NA HYALUR & NA CHOND-NA HYALUR 0.55-0.5 ML IO KIT
PACK | INTRAOCULAR | Status: DC | PRN
  Administered 2011-12-07: 1 via OPHTHALMIC

## 2011-12-07 MED ORDER — CYCLOPENTOLATE HCL 1 % OP SOLN: 1.0000 [drp] | Freq: Once | OPHTHALMIC | Status: DC

## 2011-12-07 MED ORDER — CYCLOPENTOLATE-PHENYLEPHRINE 0.2-1 % OP SOLN
1.0000 [drp] | Freq: Once | OPHTHALMIC | Status: AC
  Administered 2011-12-07: 1 [drp] via OPHTHALMIC

## 2011-12-07 MED ORDER — MIDAZOLAM HCL 2 MG/2ML IJ SOLN
1.0000 mg | INTRAMUSCULAR | Status: DC | PRN
  Administered 2011-12-07: 2 mg via INTRAVENOUS

## 2011-12-07 MED ORDER — TETRACAINE 0.5 % OP SOLN OPTIME - NO CHARGE
OPHTHALMIC | Status: DC | PRN
  Administered 2011-12-07: 2 [drp] via OPHTHALMIC

## 2011-12-07 MED ORDER — KETOROLAC TROMETHAMINE 0.5 % OP SOLN: 1.0000 [drp] | Freq: Once | OPHTHALMIC | Status: AC

## 2011-12-07 MED ORDER — FENTANYL CITRATE 0.05 MG/ML IJ SOLN: 25.0000 ug | INTRAMUSCULAR | Status: DC | PRN

## 2011-12-07 MED ORDER — GATIFLOXACIN 0.5 % OP SOLN
1.0000 [drp] | Freq: Once | OPHTHALMIC | Status: AC
  Administered 2011-12-07: 1 [drp] via OPHTHALMIC

## 2011-12-07 SURGICAL SUPPLY — 27 items
CAPSULAR TENSION RING-AMO (OPHTHALMIC RELATED) IMPLANT
CLOTH BEACON ORANGE TIMEOUT ST (SAFETY) ×1 IMPLANT
GLOVE BIO SURGEON STRL SZ7.5 (GLOVE) IMPLANT
GLOVE BIOGEL M 6.5 STRL (GLOVE) IMPLANT
GLOVE BIOGEL PI IND STRL 6.5 (GLOVE) IMPLANT
GLOVE BIOGEL PI IND STRL 7.0 (GLOVE) IMPLANT
GLOVE BIOGEL PI INDICATOR 6.5 (GLOVE) ×1
GLOVE BIOGEL PI INDICATOR 7.0 (GLOVE) ×1
GLOVE ECLIPSE 6.5 STRL STRAW (GLOVE) IMPLANT
GLOVE ECLIPSE 7.5 STRL STRAW (GLOVE) IMPLANT
GLOVE EXAM NITRILE LRG STRL (GLOVE) IMPLANT
GLOVE EXAM NITRILE MD LF STRL (GLOVE) ×1 IMPLANT
GLOVE SKINSENSE NS SZ6.5 (GLOVE)
GLOVE SKINSENSE NS SZ7.0 (GLOVE)
GLOVE SKINSENSE STRL SZ6.5 (GLOVE) IMPLANT
GLOVE SKINSENSE STRL SZ7.0 (GLOVE) IMPLANT
INST SET CATARACT ~~LOC~~ (KITS) ×2 IMPLANT
KIT VITRECTOMY (OPHTHALMIC RELATED) IMPLANT
PAD ARMBOARD 7.5X6 YLW CONV (MISCELLANEOUS) ×1 IMPLANT
PROC W NO LENS (INTRAOCULAR LENS)
PROC W SPEC LENS (INTRAOCULAR LENS)
PROCESS W NO LENS (INTRAOCULAR LENS) IMPLANT
PROCESS W SPEC LENS (INTRAOCULAR LENS) IMPLANT
RING MALYGIN (MISCELLANEOUS) IMPLANT
SIGHTPATH CAT PROC W REG LENS (Ophthalmic Related) ×2 IMPLANT
VISCOELASTIC ADDITIONAL (OPHTHALMIC RELATED) IMPLANT
WATER STERILE IRR 250ML POUR (IV SOLUTION) ×1 IMPLANT

## 2011-12-07 NOTE — Anesthesia Procedure Notes (Signed)
Procedure Name: MAC Date/Time: 12/07/2011 10:33 AM Performed by: Minerva Areola Pre-anesthesia Checklist: Patient identified, Patient being monitored, Emergency Drugs available, Timeout performed and Suction available Patient Re-evaluated:Patient Re-evaluated prior to inductionOxygen Delivery Method: Nasal Cannula

## 2011-12-07 NOTE — H&P (Signed)
Pt interviewed and examined and there are no changes.

## 2011-12-07 NOTE — Brief Op Note (Signed)
12/07/2011  1:39 PM  PATIENT:  Brandi Hunter  71 y.o. female  PRE-OPERATIVE DIAGNOSIS:  nuclear cataract left eye  POST-OPERATIVE DIAGNOSIS:  nuclear cataract left eye  PROCEDURE:  Procedure(s): CATARACT EXTRACTION PHACO AND INTRAOCULAR LENS PLACEMENT (IOC)  SURGEON:  Surgeon(s): Susa Simmonds, MD  ASSISTANTS: Trenton Founds, CST   ANESTHESIA STAFF: Minerva Areola, CRNA - CRNA Laurene Footman, MD - Anesthesiologist  ANESTHESIA:   topical/MAC  REQUESTED LENS POWER: 21.0  LENS IMPLANT INFORMATION: Alcon [id number] ser # [id number]  CUMULATIVE DISSIPATED ENERGY:3.61  INDICATIONS:  See H&P  OP FINDINGS:dense NS  COMPLICATIONS:None  DICTATION #: see scanned document  PLAN OF CARE: see H&P  PATIENT DISPOSITION:  Short Stay

## 2011-12-07 NOTE — Transfer of Care (Signed)
Immediate Anesthesia Transfer of Care Note  Patient: Brandi Hunter  Procedure(s) Performed:  CATARACT EXTRACTION PHACO AND INTRAOCULAR LENS PLACEMENT (IOC) - CDE 3.61  Patient Location: Shortstay  Anesthesia Type: MAC  Level of Consciousness: awake  Airway & Oxygen Therapy: Patient Spontanous Breathing   Post-op Assessment: Report given to PACU RN, Post -op Vital signs reviewed and stable and Patient moving all extremities  Post vital signs: Reviewed and stable  Complications: No apparent anesthesia complications

## 2011-12-07 NOTE — Op Note (Signed)
See scanned document created in another system 

## 2011-12-07 NOTE — Anesthesia Preprocedure Evaluation (Addendum)
Anesthesia Evaluation  Patient identified by MRN, date of birth, ID band Patient awake    Reviewed: Allergy & Precautions, H&P , NPO status , Patient's Chart, lab work & pertinent test results  History of Anesthesia Complications Negative for: history of anesthetic complications  Airway Mallampati: I      Dental  (+) Edentulous Upper and Edentulous Lower   Pulmonary asthma , sleep apnea and Continuous Positive Airway Pressure Ventilation , COPD   Pulmonary exam normal       Cardiovascular hypertension, Pt. on medications + dysrhythmias + pacemaker Regular Normal    Neuro/Psych    GI/Hepatic   Endo/Other  Diabetes mellitus-, Well Controlled, Oral Hypoglycemic Agents  Renal/GU      Musculoskeletal   Abdominal   Peds  Hematology   Anesthesia Other Findings   Reproductive/Obstetrics                           Anesthesia Physical Anesthesia Plan  ASA: III  Anesthesia Plan: MAC   Post-op Pain Management:    Induction: Intravenous  Airway Management Planned: Nasal Cannula  Additional Equipment:   Intra-op Plan:   Post-operative Plan:   Informed Consent: I have reviewed the patients History and Physical, chart, labs and discussed the procedure including the risks, benefits and alternatives for the proposed anesthesia with the patient or authorized representative who has indicated his/her understanding and acceptance.     Plan Discussed with:   Anesthesia Plan Comments:         Anesthesia Quick Evaluation

## 2011-12-07 NOTE — Anesthesia Postprocedure Evaluation (Signed)
  Anesthesia Post-op Note  Patient: Brandi Hunter  Procedure(s) Performed:  CATARACT EXTRACTION PHACO AND INTRAOCULAR LENS PLACEMENT (IOC) - CDE 3.61  Patient Location:  Short Stay  Anesthesia Type: MAC  Level of Consciousness: awake  Airway and Oxygen Therapy: Patient Spontanous Breathing  Post-op Pain: none  Post-op Assessment: Post-op Vital signs reviewed, Patient's Cardiovascular Status Stable, Respiratory Function Stable, Patent Airway, No signs of Nausea or vomiting and Pain level controlled  Post-op Vital Signs: Reviewed and stable  Complications: No apparent anesthesia complications

## 2011-12-08 ENCOUNTER — Encounter (HOSPITAL_COMMUNITY): Payer: Self-pay | Admitting: Ophthalmology

## 2012-03-15 ENCOUNTER — Encounter (HOSPITAL_COMMUNITY): Payer: Self-pay | Admitting: Dietician

## 2012-03-15 NOTE — Progress Notes (Signed)
[facility] Diabetes Class Completion  Date:March 15, 2012  Time: 10:00 AM  Pt attended [facility]'s Diabetes Class on March 15, 2012.   Patient was educated on the following topics: carbohydrate metabolism in relation to diabetes, sources of carbohydrate, carbohydrate counting, meal planning strategies, food label reading, and portion control.   Dartanyon Frankowski A. Kayan, RD, LDN Date:March 15, 2012 Time: 10:00 AM 

## 2012-03-22 ENCOUNTER — Encounter: Payer: Self-pay | Admitting: Internal Medicine

## 2012-03-22 ENCOUNTER — Ambulatory Visit (INDEPENDENT_AMBULATORY_CARE_PROVIDER_SITE_OTHER): Payer: Medicare Other | Admitting: *Deleted

## 2012-03-22 DIAGNOSIS — I441 Atrioventricular block, second degree: Secondary | ICD-10-CM

## 2012-03-24 LAB — REMOTE PACEMAKER DEVICE
AL AMPLITUDE: 2.8 mv
AL THRESHOLD: 0.5 V
BAMS-0001: 165 {beats}/min
BATTERY VOLTAGE: 2.79 V
RV LEAD AMPLITUDE: 16 mv

## 2012-03-25 NOTE — Progress Notes (Signed)
PPM remote 

## 2012-04-01 ENCOUNTER — Encounter: Payer: Self-pay | Admitting: *Deleted

## 2012-04-08 DIAGNOSIS — G4734 Idiopathic sleep related nonobstructive alveolar hypoventilation: Secondary | ICD-10-CM | POA: Insufficient documentation

## 2012-06-22 ENCOUNTER — Ambulatory Visit (INDEPENDENT_AMBULATORY_CARE_PROVIDER_SITE_OTHER): Payer: Medicare Other | Admitting: Cardiology

## 2012-06-22 ENCOUNTER — Encounter: Payer: Self-pay | Admitting: Internal Medicine

## 2012-06-22 VITALS — BP 130/66 | HR 70 | Ht 66.0 in | Wt 269.0 lb

## 2012-06-22 DIAGNOSIS — Z950 Presence of cardiac pacemaker: Secondary | ICD-10-CM

## 2012-06-22 DIAGNOSIS — I4891 Unspecified atrial fibrillation: Secondary | ICD-10-CM

## 2012-06-22 DIAGNOSIS — I441 Atrioventricular block, second degree: Secondary | ICD-10-CM

## 2012-06-22 LAB — PACEMAKER DEVICE OBSERVATION
AL IMPEDENCE PM: 436 Ohm
BATTERY VOLTAGE: 2.78 V
RV LEAD AMPLITUDE: 15.68 mv
RV LEAD IMPEDENCE PM: 580 Ohm

## 2012-06-22 NOTE — Patient Instructions (Addendum)
Your physician recommends that you schedule a follow-up appointment in: 2 months with Nehemiah Settle on a day when Dr Johney Frame is here.

## 2012-06-22 NOTE — Progress Notes (Signed)
ELECTROPHYSIOLOGY OFFICE NOTE  Patient ID: Brandi Hunter MRN: [id number], DOB/AGE: [birth year]   Date of Visit: 06/22/2012  Primary Physician: Elizabeth Palau, NP Primary Cardiologist: Hillis Range, MD Reason for Visit: Device/EP follow-up  History of Present Illness Brandi Hunter is a pleasant 71 year old woman with Mobitz II AV block s/p PPM, HTN, OSA, dyslipidemia and asthma who presents today for device/EP follow-up. She reports she is undergoing GI work-up for recent bloody diarrhea. She is scheduled for a colonoscopy in a few weeks. She denies CP, SOB, dizziness, near syncope or syncope. She has had occasional racing palpitations but these are usually brief in duration. They are not accompanied by any other symptoms.   Past Medical History  Diagnosis Date  . Pacemaker     Implanted  by Dr Deborah Chalk (MDT) 10/06/10  . Mobitz (type) II atrioventricular block   . HTN (hypertension)   . Diabetes in pregnancy   . Obesity   . Hyperlipidemia   . DJD (degenerative joint disease)   . Asthma   . COPD (chronic obstructive pulmonary disease)   . Pancreatitis     pt thought this had been ruled out  . Sleep apnea     Past Surgical History  Procedure Date  . Insert / replace / remove pacemaker 10/06/10    MDT  implanted by Dr Deborah Chalk  . Total abdominal hysterectomy 1971  . Cystocele repair   . Ovary surgery     removal  . Repair rectocele   . Knee arthroscopy     both  . Pancreatitis   . Cataract extraction w/phaco 11/02/2011    Procedure: CATARACT EXTRACTION PHACO AND INTRAOCULAR LENS PLACEMENT (IOC);  Surgeon: Susa Simmonds;  Location: [facility];  Service: Ophthalmology;  Laterality: Right;  CDE=7.33  . Eye surgery 11/01/2012    right eye  . Cataract extraction w/phaco 12/07/2011    Procedure: CATARACT EXTRACTION PHACO AND INTRAOCULAR LENS PLACEMENT (IOC);  Surgeon: Susa Simmonds, MD;  Location: [facility];  Service: Ophthalmology;  Laterality: Left;  CDE 3.61      Allergies/Intolerances Allergies  Allergen Reactions  . Latex Swelling    Some types    Current Home Medications Current Outpatient Prescriptions  Medication Sig Dispense Refill  . albuterol (PROAIR HFA) 108 (90 BASE) MCG/ACT inhaler Inhale 2 puffs into the lungs every 6 (six) hours as needed. Shortness of breath      . amLODipine (NORVASC) 5 MG tablet Take 1 tablet (5 mg total) by mouth daily.  30 tablet  11  . budesonide-formoterol (SYMBICORT) 160-4.5 MCG/ACT inhaler Inhale 2 puffs into the lungs 2 (two) times daily.        . Calcium Carbonate-Vitamin D (RA CALCIUM PLUS VITAMIN D) 600-400 MG-UNIT per tablet Take 2 tablets by mouth daily.        . Cholecalciferol (VITAMIN D) 2000 UNITS CAPS Take 1 capsule by mouth daily.        . citalopram (CELEXA) 40 MG tablet Take 40 mg by mouth daily.        . fenofibrate 160 MG tablet Take 160 mg by mouth daily.        . furosemide (LASIX) 20 MG tablet Take 20 mg by mouth every other day.       . losartan (COZAAR) 100 MG tablet Take 100 mg by mouth daily.        . metFORMIN (GLUCOPHAGE) 500 MG tablet Take 1,000 mg by mouth 2 (two) times daily with a meal.       .  metoprolol (TOPROL-XL) 50 MG 24 hr tablet Take 50 mg by mouth daily.        Marland Kitchen oxybutynin (DITROPAN-XL) 10 MG 24 hr tablet Take 10 mg by mouth daily.        . pantoprazole (PROTONIX) 40 MG tablet Take 40 mg by mouth daily.        . pravastatin (PRAVACHOL) 40 MG tablet Take 40 mg by mouth daily.        . Probiotic Product (RESTORA PO) Take 1 capsule by mouth daily.        Marland Kitchen tiotropium (SPIRIVA HANDIHALER) 18 MCG inhalation capsule Place 18 mcg into inhaler and inhale daily.          Social History Social History  . Marital Status: Widowed   Social History Main Topics  . Smoking status: Former Smoker -- 0.1 packs/day for 50 years    Types: Cigarettes    Quit date: 11/17/2011  . Smokeless tobacco: Former Neurosurgeon    Quit date: 11/17/2011  . Alcohol Use: 0.6 oz/week    1 Glasses of  wine per week     couple glasses of wine occasionally-once a month  . Drug Use: No   Review of Systems General: No chills, fever, night sweats or weight changes Cardiovascular: No chest pain, dyspnea on exertion, edema, orthopnea, palpitations, paroxysmal nocturnal dyspnea Dermatological: No rash, lesions or masses Respiratory: No cough, dyspnea Urologic: No hematuria, dysuria Abdominal: No nausea, vomiting, diarrhea, bright red blood per rectum, melena, or hematemesis Neurologic: No visual changes, weakness, changes in mental status All other systems reviewed and are otherwise negative except as noted above.  Physical Exam Blood pressure 130/66, pulse 70, height 5\' 6"  (1.676 m), weight 269 lb (122.018 kg), SpO2 95.00%.  General: Well developed, well appearing 71 year old female in no acute distress. HEENT: Normocephalic, atraumatic. EOMs intact. Sclera nonicteric. Oropharynx clear.  Neck: Supple. No JVD. Lungs:  Respirations regular and unlabored, CTA bilaterally. Heart: RRR. S1, S2 present. No murmurs, rub, S3 or S4. Abdomen: Soft, non-distended. Extremities: No clubbing, cyanosis or edema. Psych: Normal affect. Neuro: Alert and oriented X 3. Moves all extremities spontaneously.   Diagnostics Device interrogation shows normal dual chamber PPM function with good battery status and stable lead parameters/measurements; DDD with lower rate of 60, P waves 1.4 - >2.8 mV, R waves 11.4 - 22.4 mV, atrial lead impedance 436 ohms, RV lead impedance 580 ohms; atrial threshold 0.5 V at 0.4 ms, ventricular threshold 0.75 V at 0.4 ms; there were 3 AHR episodes, the longest ~4 hours, with EGM consistent with AF; no programming changes made; see PaceArt report  Assessment and Plan 1. Mobitz II AV block s/p PPM - normal device function 2. Atrial fibrillation - newly documented; discussed AFib with Brandi Hunter today in detail including the need for chronic anticoagulation for stroke risk reduction; her  CHADS2VASc score is 4, indicating she is at high risk for cardioembolic event; she is undergoing GI work-up for recent bloody diarrhea and is scheduled for colonoscopy in a few weeks; she will return to clinic in 6 weeks for follow-up to discuss anticoagulation pending the results of her GI work-up  This plan of care was formulated with Dr. Johney Frame who was in to see the patient with me. Signed, Rick Duff, PA-C 06/22/2012, 2:02 PM

## 2012-08-08 ENCOUNTER — Ambulatory Visit: Payer: Medicare Other | Admitting: Physician Assistant

## 2012-08-12 ENCOUNTER — Ambulatory Visit (INDEPENDENT_AMBULATORY_CARE_PROVIDER_SITE_OTHER): Payer: Medicare Other | Admitting: Internal Medicine

## 2012-08-12 ENCOUNTER — Encounter: Payer: Self-pay | Admitting: Internal Medicine

## 2012-08-12 VITALS — BP 161/59 | HR 81 | Ht 66.0 in | Wt 272.0 lb

## 2012-08-12 DIAGNOSIS — I10 Essential (primary) hypertension: Secondary | ICD-10-CM

## 2012-08-12 DIAGNOSIS — I4891 Unspecified atrial fibrillation: Secondary | ICD-10-CM

## 2012-08-12 DIAGNOSIS — R0602 Shortness of breath: Secondary | ICD-10-CM

## 2012-08-12 DIAGNOSIS — Z01810 Encounter for preprocedural cardiovascular examination: Secondary | ICD-10-CM

## 2012-08-12 DIAGNOSIS — I441 Atrioventricular block, second degree: Secondary | ICD-10-CM

## 2012-08-12 MED ORDER — METOPROLOL SUCCINATE ER 100 MG PO TB24: 50.0000 mg | ORAL_TABLET | Freq: Every day | ORAL | Status: DC

## 2012-08-12 NOTE — Patient Instructions (Addendum)
Your physician wants you to follow-up in: 12 months with [name] will receive a reminder letter in the mail two months in advance. If you don't receive a letter, please call our office to schedule the follow-up appointment.  Remote monitoring is used to monitor your Pacemaker of ICD from home. This monitoring reduces the number of office visits required to check your device to one time per year. It allows [place] to keep an eye on the functioning of your device to ensure it is working properly. You are scheduled for a device check from home on 11/14/12. You may send your transmission at any time that day. If you have a wireless device, the transmission will be sent automatically. After your physician reviews your transmission, you will receive a postcard with your next transmission date.  Your physician has recommended you make the following change in your medication:  1) Increase Metoprolol to 100mg  daily daily      Your physician has requested that you have an echocardiogram. Echocardiography is a painless test that uses sound waves to create images of your heart. It provides your doctor with information about the size and shape of your heart and how well your heart's chambers and valves are working. This procedure takes approximately one hour. There are no restrictions for this procedure.    Your physician has requested that you have a lexiscan myoview. For further information please visit [email or web address] Please follow instruction sheet, as given.  Marland Kitchen

## 2012-08-15 DIAGNOSIS — Z01810 Encounter for preprocedural cardiovascular examination: Secondary | ICD-10-CM | POA: Insufficient documentation

## 2012-08-15 LAB — PACEMAKER DEVICE OBSERVATION
AL THRESHOLD: 0.5 V
ATRIAL PACING PM: 50
BAMS-0001: 165 {beats}/min
BATTERY VOLTAGE: 2.78 V
RV LEAD AMPLITUDE: 15.68 mv

## 2012-08-15 NOTE — Assessment & Plan Note (Signed)
I am asked by Dr Loreta Ave to assess prior to colonoscopy.  She apparently "turned blue" during her induction for colonoscopy and the procedure was aborted.  The patient feels that she was coughing and notes that she has had similar episodes in the past.  She is scheduled to see Pulmonary soon.  I think that given her risks factors of age, diabetes, and hypertension that further CV risk stratification would be beneficial at this time. Given her episode of hypoxia with recent anesthesia, I will order an echo as well as an lexiscan to evaluate for structural heart changes or ischemic changes as the possible cause for the event.  If normal, then no further CV testing would be required and she could proceed with colonoscopy if indicated.

## 2012-08-15 NOTE — Assessment & Plan Note (Signed)
Normal pacemaker function See Arita Miss Art report  She has frequent PACs which are tracking by her device.  This is confirmed to be what she was having on the rhythm strip in [facility] office. I have reprogrammed today to turn MVP on to minimize ventricular pacing and hopefully decrease tracking of her PACs. She appears to conduct 1:1 at this time with MVP. We will follow with carelink.  She will contact our office if she develops any clinical symptoms with this change.

## 2012-08-15 NOTE — Assessment & Plan Note (Signed)
Above goal today Given PACs, I will increase metoprolol

## 2012-08-15 NOTE — Progress Notes (Signed)
Brandi Hunter:Brandi Hunter,FAOZHY, FNP  The patient presents today for electrophysiology followup at the request of Dr Loreta Ave.  Since last being seen in our clinic, the patient reports doing very well.  Today, she denies symptoms of palpitations, chest pain, shortness of breath, orthopnea, PND, lower extremity edema, dizziness, presyncope, further syncope, or neurologic sequela.   Last week, she presented to Dr Kenna Gilbert office for routine colonoscopy.  She was observed to have frequent ectopy on the rhythm stip at that time.  During induction of anesthesia, the patient reports that she began to cough.  She states that this has happened before.  Per report, her lips turned blue and she was felt to be high risk for proceeding with the procedure.  She therefore did not have the procedure performed.  She presents to our office for further CV assessment prior to her repeat procedure.  The patient feels that she is tolerating medications without difficulties and is otherwise without complaint today.   Past Medical History  Diagnosis Date  . Pacemaker     Implanted  by Dr Deborah Chalk (MDT) 10/06/10  . Mobitz (type) II atrioventricular block   . HTN (hypertension)   . Diabetes in pregnancy   . Obesity   . Hyperlipidemia   . DJD (degenerative joint disease)   . Asthma   . COPD (chronic obstructive pulmonary disease)   . Pancreatitis     pt thought this had been ruled out  . Sleep apnea    Past Surgical History  Procedure Date  . Insert / replace / remove pacemaker 10/06/10    MDT  implanted by Dr Deborah Chalk  . Total abdominal hysterectomy 1971  . Cystocele repair   . Ovary surgery     removal  . Repair rectocele   . Knee arthroscopy     both  . Pancreatitis   . Cataract extraction w/phaco 11/02/2011    Procedure: CATARACT EXTRACTION PHACO AND INTRAOCULAR LENS PLACEMENT (IOC);  Surgeon: Susa Simmonds;  Location: [facility];  Service: Ophthalmology;  Laterality: Right;  CDE=7.33  . Eye surgery 11/01/2012    right  eye  . Cataract extraction w/phaco 12/07/2011    Procedure: CATARACT EXTRACTION PHACO AND INTRAOCULAR LENS PLACEMENT (IOC);  Surgeon: Susa Simmonds, MD;  Location: [facility];  Service: Ophthalmology;  Laterality: Left;  CDE 3.61    Current Outpatient Prescriptions  Medication Sig Dispense Refill  . albuterol (PROAIR HFA) 108 (90 BASE) MCG/ACT inhaler Inhale 2 puffs into the lungs every 6 (six) hours as needed. Shortness of breath      . amLODipine (NORVASC) 5 MG tablet Take 1 tablet (5 mg total) by mouth daily.  30 tablet  11  . budesonide-formoterol (SYMBICORT) 160-4.5 MCG/ACT inhaler Inhale 2 puffs into the lungs 2 (two) times daily.        . Calcium Carbonate-Vitamin D (RA CALCIUM PLUS VITAMIN D) 600-400 MG-UNIT per tablet Take 2 tablets by mouth daily.        . Cholecalciferol (VITAMIN D) 2000 UNITS CAPS Take 1 capsule by mouth daily.        . citalopram (CELEXA) 40 MG tablet Take 40 mg by mouth daily.        . cyanocobalamin 2000 MCG tablet Take 2,000 mcg by mouth daily.      . fenofibrate 160 MG tablet Take 160 mg by mouth daily.        . furosemide (LASIX) 20 MG tablet Take 20 mg by mouth every other day.       Marland Kitchen  glipiZIDE (GLUCOTROL XL) 10 MG 24 hr tablet Take 1 tablet by mouth daily.      Marland Kitchen LORazepam (ATIVAN) 0.5 MG tablet Take 1 tablet by mouth as needed.      Marland Kitchen losartan (COZAAR) 100 MG tablet Take 100 mg by mouth daily.        . metFORMIN (GLUCOPHAGE) 500 MG tablet Take 1,000 mg by mouth 2 (two) times daily with a meal.       . metoprolol succinate (TOPROL-XL) 100 MG 24 hr tablet Take 1 tablet (100 mg total) by mouth daily.  90 tablet  3  . oxybutynin (DITROPAN-XL) 10 MG 24 hr tablet Take 10 mg by mouth daily.        . pantoprazole (PROTONIX) 40 MG tablet Take 40 mg by mouth daily.        . pravastatin (PRAVACHOL) 40 MG tablet Take 40 mg by mouth daily.        . Probiotic Product (RESTORA PO) Take 1 capsule by mouth daily.        Marland Kitchen tiotropium (SPIRIVA HANDIHALER) 18 MCG inhalation  capsule Place 18 mcg into inhaler and inhale daily.          Allergies  Allergen Reactions  . Latex Swelling    Some types    History   Social History  . Marital Status: Widowed    Spouse Name: N/A    Number of Children: N/A  . Years of Education: N/A   Occupational History  . Not on file.   Social History Main Topics  . Smoking status: Former Smoker -- 0.1 packs/day for 50 years    Types: Cigarettes    Quit date: 11/17/2011  . Smokeless tobacco: Former Neurosurgeon    Quit date: 11/17/2011  . Alcohol Use: 0.6 oz/week    1 Glasses of wine per week     couple glasses of wine occasionally-once a month  . Drug Use: No  . Sexually Active: Not on file   Other Topics Concern  . Not on file   Social History Narrative  . No narrative on file    Family History  Problem Relation Age of Onset  . Diabetes    . Heart disease Mother     CHF  . Heart disease Father     CHF  . Arthritis Sister     osteo  . Cancer Brother     prostate  . Anesthesia problems Neg Hx   . Hypotension Neg Hx   . Malignant hyperthermia Neg Hx   . Pseudochol deficiency Neg Hx     ROS-  All systems are reviewed and are negative except as outlined in the HPI above   Physical Exam: Filed Vitals:   08/12/12 1536  BP: 161/59  Pulse: 81  Height: 5\' 6"  (1.676 m)  Weight: 272 lb (123.378 kg)  SpO2: 97%    GEN- The patient is well appearing, alert and oriented x 3 today.   Head- normocephalic, atraumatic Eyes-  Sclera clear, conjunctiva pink Ears- hearing intact Oropharynx- clear Neck- supple, no JVP Lymph- no cervical lymphadenopathy Lungs- Clear to ausculation bilaterally, normal work of breathing Chest- pacemaker pocket is well healed Heart- Regular rate and rhythm, no murmurs, rubs or gallops, PMI not laterally displaced GI- soft, NT, ND, + BS Extremities- no clubbing, cyanosis, or edema  Pacemaker interrogation- reviewed in detail today,  See PACEART report  Assessment and Plan:

## 2012-08-17 ENCOUNTER — Ambulatory Visit (HOSPITAL_COMMUNITY): Payer: Medicare Other | Attending: Cardiovascular Disease

## 2012-08-17 DIAGNOSIS — I379 Nonrheumatic pulmonary valve disorder, unspecified: Secondary | ICD-10-CM | POA: Insufficient documentation

## 2012-08-17 DIAGNOSIS — I4891 Unspecified atrial fibrillation: Secondary | ICD-10-CM

## 2012-08-17 DIAGNOSIS — R0602 Shortness of breath: Secondary | ICD-10-CM

## 2012-08-17 DIAGNOSIS — I10 Essential (primary) hypertension: Secondary | ICD-10-CM | POA: Insufficient documentation

## 2012-08-17 DIAGNOSIS — I059 Rheumatic mitral valve disease, unspecified: Secondary | ICD-10-CM | POA: Insufficient documentation

## 2012-08-17 DIAGNOSIS — J449 Chronic obstructive pulmonary disease, unspecified: Secondary | ICD-10-CM | POA: Insufficient documentation

## 2012-08-17 DIAGNOSIS — J4489 Other specified chronic obstructive pulmonary disease: Secondary | ICD-10-CM | POA: Insufficient documentation

## 2012-08-17 DIAGNOSIS — I369 Nonrheumatic tricuspid valve disorder, unspecified: Secondary | ICD-10-CM | POA: Insufficient documentation

## 2012-08-17 NOTE — Progress Notes (Signed)
Echocardiogram performed.  

## 2012-08-24 ENCOUNTER — Encounter (HOSPITAL_COMMUNITY): Payer: Medicare Other

## 2012-08-25 ENCOUNTER — Encounter: Payer: Medicare Other | Admitting: Cardiology

## 2012-08-25 ENCOUNTER — Encounter: Payer: Self-pay | Admitting: *Deleted

## 2012-08-26 ENCOUNTER — Other Ambulatory Visit: Payer: Self-pay | Admitting: *Deleted

## 2012-08-26 ENCOUNTER — Ambulatory Visit (INDEPENDENT_AMBULATORY_CARE_PROVIDER_SITE_OTHER): Payer: Medicare Other | Admitting: Pulmonary Disease

## 2012-08-26 ENCOUNTER — Ambulatory Visit (INDEPENDENT_AMBULATORY_CARE_PROVIDER_SITE_OTHER)
Admission: RE | Admit: 2012-08-26 | Discharge: 2012-08-26 | Disposition: A | Discharge status: Home / Self Care | Payer: Medicare Other | Source: Ambulatory Visit | Attending: Pulmonary Disease | Admitting: Pulmonary Disease

## 2012-08-26 ENCOUNTER — Encounter: Payer: Self-pay | Admitting: Pulmonary Disease

## 2012-08-26 VITALS — BP 118/80 | HR 90 | Temp 98.2°F | Ht 67.0 in | Wt 270.6 lb

## 2012-08-26 DIAGNOSIS — R05 Cough: Secondary | ICD-10-CM

## 2012-08-26 DIAGNOSIS — R0989 Other specified symptoms and signs involving the circulatory and respiratory systems: Secondary | ICD-10-CM

## 2012-08-26 DIAGNOSIS — G4733 Obstructive sleep apnea (adult) (pediatric): Secondary | ICD-10-CM | POA: Insufficient documentation

## 2012-08-26 DIAGNOSIS — J449 Chronic obstructive pulmonary disease, unspecified: Secondary | ICD-10-CM | POA: Insufficient documentation

## 2012-08-26 DIAGNOSIS — R0609 Other forms of dyspnea: Secondary | ICD-10-CM

## 2012-08-26 MED ORDER — AMLODIPINE BESYLATE 5 MG PO TABS: 5.0000 mg | ORAL_TABLET | Freq: Every day | ORAL | Status: DC

## 2012-08-26 NOTE — Assessment & Plan Note (Signed)
The patient's dyspnea on exertion is obviously multifactorial.  She is obese and deconditioned, has underlying heart disease, and may have COPD based on her history.  I would like to check a chest x-ray today, and also do full pulmonary function studies to further assess her breathing.  I have asked her to continue on her current regimen until we can get some results back.

## 2012-08-26 NOTE — Telephone Encounter (Signed)
Fax Received. Refill Completed. Brandi Hunter (R.M.A)   

## 2012-08-26 NOTE — Progress Notes (Signed)
  Subjective:    Patient ID: Brandi Hunter, female    DOB: May 13, 1941, 71 y.o.   MRN: [id number]  HPI The patient is a 71 year old female who I've been asked to see for pulmonary evaluation.  She has a history of sleep apnea for which she is on CPAP and nocturnal oxygen, and also has been told that she has COPD.  She was recently having a colonoscopy approximately 3 weeks ago, and was noted to have bradycardia and hypotension.  She has known underlying heart disease and arrhythmias.  The decision was made to proceed with the procedure, and she developed an episode of cough with falling oxygen saturations.  The procedure had to be abandoned.  The patient tells me that she has never had full PFTs, and she is currently on symbicort and Spiriva.  She has not had a recent chest x-ray.  She feels that she is doing well with the CPAP, and notes restorative sleep and very little daytime sleepiness.  She thinks her inhalers have helped her breathing, but unfortunately she continues to smoke.  She describes a 1-2 block dyspnea on exertion at a moderate pace, but has no issues bringing groceries in from the car or doing light housework.  She will get winded up one flight of stairs.  She does have a history of lower extremity edema.  The patient also has a history of coughing for 45 years, and describes a classic upper airway dysfunction syndrome.  She has had an upper airway exam with otolaryngology, with no abnormality being found.  She describes a classic globus sensation.   Review of Systems  Constitutional: Negative for fever and unexpected weight change.  HENT: Negative for ear pain, nosebleeds, congestion, sore throat, rhinorrhea, sneezing, trouble swallowing, dental problem, postnasal drip and sinus pressure.   Eyes: Negative for redness and itching.  Respiratory: Positive for cough ( several years) and shortness of breath ( with exertion). Negative for chest tightness and wheezing.   Cardiovascular: Negative  for palpitations and leg swelling.  Gastrointestinal: Negative for nausea and vomiting.  Genitourinary: Negative for dysuria.  Musculoskeletal: Negative for joint swelling.  Skin: Negative for rash.  Neurological: Negative for headaches.  Hematological: Does not bruise/bleed easily.  Psychiatric/Behavioral: Negative for dysphoric mood. The patient is not nervous/anxious.        Objective:   Physical Exam Constitutional:  Obese female, no acute distress  HENT:  Nares patent without discharge, large turbinates  Oropharynx without exudate, palate and uvula are elongated.  Eyes:  Perrla, eomi, no scleral icterus  Neck:  No JVD, no TMG  Cardiovascular:  Normal rate, regular rhythm, no rubs or gallops.  2/6 sem        Intact distal pulses  Pulmonary :  Normal breath sounds, no stridor or respiratory distress   No  rhonchi, or wheezing, mild basilar crackles.   Abdominal:  Soft, nondistended, bowel sounds present.  No tenderness noted.   Musculoskeletal:  1+ lower extremity edema noted.  Lymph Nodes:  No cervical lymphadenopathy noted  Skin:  No cyanosis noted  Neurologic:  Alert, appropriate, moves all 4 extremities without obvious deficit.         Assessment & Plan:

## 2012-08-26 NOTE — Assessment & Plan Note (Signed)
The patient has a chronic cough for 45 years duration, and I have told her it is very unlikely that it will ever resolve.  I suspect she has a hyper sensitized upper airway, with concomitant cyclical coughing.  I have reviewed with her the behavioral therapies to help with cyclical coughing, and that she must also treat any postnasal drip or reflux disease aggressively.

## 2012-08-26 NOTE — Assessment & Plan Note (Signed)
The patient has a history of obstructive sleep apnea, and feels that she is doing well on CPAP.  I have encouraged her to keep up with mask changes and supplies, and to work aggressively on weight loss.

## 2012-08-26 NOTE — Patient Instructions (Addendum)
No change in current medications for breathing  Will schedule for breathing studies in next few weeks, and will see you back same day to review. Will check cxr today, and call you with results.

## 2012-08-29 ENCOUNTER — Encounter: Payer: Self-pay | Admitting: Pulmonary Disease

## 2012-08-29 NOTE — Progress Notes (Signed)
Quick Note:  Called and spoke with patient, informed her of results/recs as listed below per Dr. Shelle Iron. Patient verbalized understanding and nothing further needed at this time. ______

## 2012-08-31 ENCOUNTER — Ambulatory Visit (HOSPITAL_COMMUNITY): Payer: Medicare Other | Attending: Cardiology | Admitting: Radiology

## 2012-08-31 VITALS — BP 143/72 | Ht 67.0 in | Wt 268.0 lb

## 2012-08-31 DIAGNOSIS — J449 Chronic obstructive pulmonary disease, unspecified: Secondary | ICD-10-CM | POA: Insufficient documentation

## 2012-08-31 DIAGNOSIS — I4891 Unspecified atrial fibrillation: Secondary | ICD-10-CM

## 2012-08-31 DIAGNOSIS — Z950 Presence of cardiac pacemaker: Secondary | ICD-10-CM | POA: Insufficient documentation

## 2012-08-31 DIAGNOSIS — R079 Chest pain, unspecified: Secondary | ICD-10-CM

## 2012-08-31 DIAGNOSIS — I10 Essential (primary) hypertension: Secondary | ICD-10-CM | POA: Insufficient documentation

## 2012-08-31 DIAGNOSIS — R0609 Other forms of dyspnea: Secondary | ICD-10-CM | POA: Insufficient documentation

## 2012-08-31 DIAGNOSIS — R0989 Other specified symptoms and signs involving the circulatory and respiratory systems: Secondary | ICD-10-CM | POA: Insufficient documentation

## 2012-08-31 DIAGNOSIS — J4489 Other specified chronic obstructive pulmonary disease: Secondary | ICD-10-CM | POA: Insufficient documentation

## 2012-08-31 DIAGNOSIS — I491 Atrial premature depolarization: Secondary | ICD-10-CM

## 2012-08-31 DIAGNOSIS — Z01810 Encounter for preprocedural cardiovascular examination: Secondary | ICD-10-CM | POA: Insufficient documentation

## 2012-08-31 DIAGNOSIS — R0602 Shortness of breath: Secondary | ICD-10-CM | POA: Insufficient documentation

## 2012-08-31 DIAGNOSIS — Z87891 Personal history of nicotine dependence: Secondary | ICD-10-CM | POA: Insufficient documentation

## 2012-08-31 DIAGNOSIS — E785 Hyperlipidemia, unspecified: Secondary | ICD-10-CM | POA: Insufficient documentation

## 2012-08-31 DIAGNOSIS — I441 Atrioventricular block, second degree: Secondary | ICD-10-CM | POA: Insufficient documentation

## 2012-08-31 DIAGNOSIS — R002 Palpitations: Secondary | ICD-10-CM | POA: Insufficient documentation

## 2012-08-31 DIAGNOSIS — R0789 Other chest pain: Secondary | ICD-10-CM | POA: Insufficient documentation

## 2012-08-31 MED ORDER — REGADENOSON 0.4 MG/5ML IV SOLN
0.4000 mg | Freq: Once | INTRAVENOUS | Status: AC
  Administered 2012-08-31: 0.4 mg via INTRAVENOUS

## 2012-08-31 MED ORDER — TECHNETIUM TC 99M SESTAMIBI GENERIC - CARDIOLITE
30.0000 | Freq: Once | INTRAVENOUS | Status: AC | PRN
  Administered 2012-08-31: 30 via INTRAVENOUS

## 2012-08-31 NOTE — Progress Notes (Signed)
[facility] SITE 3 NUCLEAR MED [address] [id number] [place] [address] [phone]  Cardiology Nuclear Med Study  Brandi Hunter is a 71 y.o. female     MRN : [id number]     DOB: 1941/07/26  Procedure Date: 08/31/2012  Nuclear Med Background Indication for Stress Test:  Evaluation for Ischemia,and, Pending Surgical Clearance for  Colonoscopy due to episode of hypoxia with recent anesthesia during 1st attempt at colonoscopy on by Dr. Charna Elizabeth History:  Asthma, COPD and AFIB, ECHO, 11/11 Pacemaker: Mobitz II AVB Cardiac Risk Factors: History of Smoking, Hypertension and Lipids  Symptoms:  Chest Tightness, DOE, Palpitations and SOB   Nuclear Pre-Procedure Caffeine/Decaff Intake:  None > 12 hrs NPO After: 8:00pm   Lungs:  clear O2 Sat: 97% on room air. IV 0.9% NS with Angio Cath:  20g  IV Site: L Antecubital x 1, tolerated well IV Started by:  Irean Hong, RN  Chest Size (in):  44 Cup Size: C  Height: 5\' 7"  (1.702 m)  Weight:  268 lb (121.564 kg)  BMI:  Body mass index is 41.97 kg/(m^2). Tech Comments:  FBS was 106 at 6:00am,no diabetic medication today,and  last dose Toprol at 8pm last night. History of onset shingles on (R) arm 2 weeks ago, no blisters now.    Nuclear Med Study 1 or 2 day study: 2 day  Stress Test Type:  Brandi Hunter  Reading MD: Brandi Hunter,M.D.  Order Authorizing Provider:  Hillis Range, MD  Resting Radionuclide: Technetium 41m Sestamibi  Resting Radionuclide Dose: 33.0 mCi  On      09-05-12  Stress Radionuclide:  Technetium 64m Sestamibi  Stress Radionuclide Dose: 33.0 mCi    On       08-31-12          Stress Protocol Rest HR: 60 Stress HR: 68  Rest BP: 143/72 Stress BP: 145/44  Exercise Time (min): n/a METS: n/a   Predicted Max HR: 151 bpm % Max HR: 45.03 bpm Rate Pressure Product: 9860   Dose of Adenosine (mg):  n/a Dose of Lexiscan: 0.4 mg  Dose of Atropine (mg): n/a Dose of Dobutamine: n/a mcg/kg/min (at max HR)    Stress Test Technologist: Milana Na, EMT-P  Nuclear Technologist:  Domenic Polite, CNMT     Rest Procedure:  Myocardial perfusion imaging was performed at rest 45 minutes following the intravenous administration of Technetium 68m Sestamibi. Rest ECG: Sinus Bradycardia with marked 1st degree AVB pvcs   Stress Procedure:  The patient received IV Lexiscan 0.4 mg over 15-seconds.  Technetium 77m Sestamibi injected at 30-seconds.  There were no significant changes, chest tightness, headache, and occ pacs/pvcs with Lexiscan.  Quantitative spect images were obtained after a 45 minute delay. Stress ECG: No significant change from baseline ECG  QPS Raw Data Images:  Images were motion corrected.  SOft tissue (diaphragm) underlies heart. Stress Images:  Normal homogeneous uptake in all areas of the myocardium. Rest Images:  Normal homogeneous uptake in all areas of the myocardium. Subtraction (SDS):  No evidence of ischemia. Transient Ischemic Dilatation (Normal <1.22):  0.85 Lung/Heart Ratio (Normal <0.45):  0.25  Quantitative Gated Spect Images QGS EDV:  98 ml QGS ESV:  32 ml  Impression Exercise Capacity:  Lexiscan with no exercise. BP Response:  Normal blood pressure response. Clinical Symptoms:  Mild chest pain/dyspnea. ECG Impression:  T wave normalization but no significant ST changes to suggest ischemia.   Comparison with Prior Nuclear Study: No images  to compare  Overall Impression:  Normal stress nuclear study.  LV Ejection Fraction: 67%.  LV Wall Motion:  NL LV Function; NL Wall Motion  Brandi Hunter

## 2012-09-05 ENCOUNTER — Ambulatory Visit (HOSPITAL_COMMUNITY): Payer: Medicare Other | Attending: Internal Medicine | Admitting: Radiology

## 2012-09-05 DIAGNOSIS — R0989 Other specified symptoms and signs involving the circulatory and respiratory systems: Secondary | ICD-10-CM

## 2012-09-05 MED ORDER — TECHNETIUM TC 99M SESTAMIBI GENERIC - CARDIOLITE
33.0000 | Freq: Once | INTRAVENOUS | Status: AC | PRN
  Administered 2012-09-05: 33 via INTRAVENOUS

## 2012-09-27 ENCOUNTER — Encounter: Payer: Self-pay | Admitting: Pulmonary Disease

## 2012-09-27 ENCOUNTER — Ambulatory Visit (INDEPENDENT_AMBULATORY_CARE_PROVIDER_SITE_OTHER): Payer: Medicare Other | Admitting: Pulmonary Disease

## 2012-09-27 VITALS — BP 138/82 | HR 69 | Temp 98.6°F | Ht 67.0 in | Wt 268.0 lb

## 2012-09-27 DIAGNOSIS — R059 Cough, unspecified: Secondary | ICD-10-CM

## 2012-09-27 DIAGNOSIS — R0609 Other forms of dyspnea: Secondary | ICD-10-CM

## 2012-09-27 DIAGNOSIS — R0989 Other specified symptoms and signs involving the circulatory and respiratory systems: Secondary | ICD-10-CM

## 2012-09-27 DIAGNOSIS — R05 Cough: Secondary | ICD-10-CM

## 2012-09-27 LAB — PULMONARY FUNCTION TEST

## 2012-09-27 NOTE — Progress Notes (Signed)
  Subjective:    Patient ID: Brandi Hunter, female    DOB: 04/18/1941, 71 y.o.   MRN: [id number]  HPI Patient comes in today for followup of her PFTs, done as part of a workup for dyspnea on exertion.  She was found to have very mild airflow obstruction, no restriction, and a minimally decreased DLCO.  I have reviewed the study with her in detail, and answered all of her questions.   Review of Systems  Constitutional: Negative for fever and unexpected weight change.  HENT: Negative for ear pain, nosebleeds, congestion, sore throat, rhinorrhea, sneezing, trouble swallowing, dental problem, postnasal drip and sinus pressure.   Eyes: Negative for redness and itching.  Respiratory: Negative for cough, chest tightness, shortness of breath and wheezing.   Cardiovascular: Negative for palpitations and leg swelling.  Gastrointestinal: Negative for nausea and vomiting.  Genitourinary: Negative for dysuria.  Musculoskeletal: Negative for joint swelling.  Skin: Negative for rash.  Neurological: Negative for headaches.  Hematological: Bruises/bleeds easily.  Psychiatric/Behavioral: Negative for dysphoric mood. The patient is not nervous/anxious.        Objective:   Physical Exam Obese female in no acute distress Nose without purulent discharge noted Neck without lymphadenopathy or thyromegaly Lower extremities with mild edema, no cyanosis Alert and oriented, moves all 4 extremities.       Assessment & Plan:

## 2012-09-27 NOTE — Progress Notes (Signed)
PFT done today. 

## 2012-09-27 NOTE — Patient Instructions (Addendum)
Your breathing tests show very mild copd, but you need to stop smoking. Stop spiriva as a trial, but stay on symbicort until you are able to stop smoking Can use albuterol for rescue. Work on weight loss and conditioning to improve your shortness of breath. I will send a copy of this note to your physicians followup with me in one year, or sooner if worsening breathing symptoms.

## 2012-09-27 NOTE — Assessment & Plan Note (Signed)
The patient has very mild airflow obstruction by pulmonary function studies, and therefore has very little COPD.  Her flows may even normalize if she was to totally stop smoking.  At this point, I would like her to stop Spiriva, but maintain on symbicort as long as she continues to smoke to treat airway inflammation.  I suspect she would be able to come off all maintenance medications if she is able to quit.  I suspect that her pulmonary status is contributing very little to her overall dyspnea on exertion.  I see no reason why she cannot have general anesthesia or conscious sedation from a pulmonary standpoint.

## 2012-10-17 ENCOUNTER — Encounter: Payer: Self-pay | Admitting: Pulmonary Disease

## 2012-11-01 HISTORY — PX: EYE SURGERY: SHX253

## 2012-11-14 ENCOUNTER — Encounter: Payer: Medicare Other | Admitting: *Deleted

## 2012-11-18 ENCOUNTER — Encounter: Payer: Self-pay | Admitting: *Deleted

## 2012-11-28 ENCOUNTER — Ambulatory Visit (INDEPENDENT_AMBULATORY_CARE_PROVIDER_SITE_OTHER): Payer: Medicare Other | Admitting: *Deleted

## 2012-11-28 DIAGNOSIS — I441 Atrioventricular block, second degree: Secondary | ICD-10-CM

## 2012-11-28 DIAGNOSIS — Z950 Presence of cardiac pacemaker: Secondary | ICD-10-CM

## 2012-12-06 LAB — REMOTE PACEMAKER DEVICE
AL THRESHOLD: 0.5 V
ATRIAL PACING PM: 68
BAMS-0001: 165 {beats}/min
RV LEAD THRESHOLD: 0.75 V

## 2012-12-13 ENCOUNTER — Encounter: Payer: Self-pay | Admitting: *Deleted

## 2012-12-20 ENCOUNTER — Other Ambulatory Visit: Payer: Self-pay | Admitting: *Deleted

## 2012-12-20 ENCOUNTER — Encounter (INDEPENDENT_AMBULATORY_CARE_PROVIDER_SITE_OTHER): Payer: Medicare Other

## 2012-12-20 ENCOUNTER — Telehealth: Payer: Self-pay | Admitting: *Deleted

## 2012-12-20 DIAGNOSIS — I4891 Unspecified atrial fibrillation: Secondary | ICD-10-CM

## 2012-12-20 NOTE — Telephone Encounter (Signed)
48 Hr holter monitor placed on Pt 12/20/12 TK

## 2012-12-21 ENCOUNTER — Encounter: Payer: Self-pay | Admitting: Internal Medicine

## 2013-01-17 ENCOUNTER — Other Ambulatory Visit: Payer: Self-pay | Admitting: Family Medicine

## 2013-01-17 DIAGNOSIS — Z1231 Encounter for screening mammogram for malignant neoplasm of breast: Secondary | ICD-10-CM

## 2013-02-17 ENCOUNTER — Ambulatory Visit
Admission: RE | Admit: 2013-02-17 | Discharge: 2013-02-17 | Disposition: A | Discharge status: Home / Self Care | Payer: Medicare Other | Source: Ambulatory Visit | Attending: Family Medicine | Admitting: Family Medicine

## 2013-02-17 DIAGNOSIS — Z1231 Encounter for screening mammogram for malignant neoplasm of breast: Secondary | ICD-10-CM

## 2013-02-20 ENCOUNTER — Other Ambulatory Visit: Payer: Self-pay | Admitting: Family Medicine

## 2013-02-20 DIAGNOSIS — R928 Other abnormal and inconclusive findings on diagnostic imaging of breast: Secondary | ICD-10-CM

## 2013-03-02 ENCOUNTER — Other Ambulatory Visit: Payer: Self-pay | Admitting: Family Medicine

## 2013-03-02 ENCOUNTER — Ambulatory Visit
Admission: RE | Admit: 2013-03-02 | Discharge: 2013-03-02 | Disposition: A | Discharge status: Home / Self Care | Payer: Medicare Other | Source: Ambulatory Visit | Attending: Family Medicine | Admitting: Family Medicine

## 2013-03-02 DIAGNOSIS — R928 Other abnormal and inconclusive findings on diagnostic imaging of breast: Secondary | ICD-10-CM

## 2013-03-06 ENCOUNTER — Encounter: Payer: Medicare Other | Admitting: *Deleted

## 2013-03-14 ENCOUNTER — Ambulatory Visit (INDEPENDENT_AMBULATORY_CARE_PROVIDER_SITE_OTHER): Payer: Medicare Other | Admitting: *Deleted

## 2013-03-14 ENCOUNTER — Encounter: Payer: Self-pay | Admitting: Internal Medicine

## 2013-03-14 ENCOUNTER — Other Ambulatory Visit: Payer: Self-pay | Admitting: Internal Medicine

## 2013-03-14 DIAGNOSIS — I441 Atrioventricular block, second degree: Secondary | ICD-10-CM

## 2013-03-14 DIAGNOSIS — Z950 Presence of cardiac pacemaker: Secondary | ICD-10-CM

## 2013-03-19 LAB — REMOTE PACEMAKER DEVICE
AL AMPLITUDE: 2.8 mv
BAMS-0001: 165 {beats}/min
RV LEAD THRESHOLD: 0.75 V
VENTRICULAR PACING PM: 70

## 2013-03-27 ENCOUNTER — Encounter: Payer: Self-pay | Admitting: *Deleted

## 2013-03-29 ENCOUNTER — Encounter: Payer: Self-pay | Admitting: Diagnostic Neuroimaging

## 2013-03-29 ENCOUNTER — Ambulatory Visit (INDEPENDENT_AMBULATORY_CARE_PROVIDER_SITE_OTHER): Payer: Medicare Other | Admitting: Diagnostic Neuroimaging

## 2013-03-29 VITALS — BP 170/79 | HR 45 | Temp 97.9°F | Ht 66.0 in | Wt 260.0 lb

## 2013-03-29 DIAGNOSIS — E1142 Type 2 diabetes mellitus with diabetic polyneuropathy: Secondary | ICD-10-CM

## 2013-03-29 DIAGNOSIS — E1149 Type 2 diabetes mellitus with other diabetic neurological complication: Secondary | ICD-10-CM

## 2013-03-29 DIAGNOSIS — E1140 Type 2 diabetes mellitus with diabetic neuropathy, unspecified: Secondary | ICD-10-CM

## 2013-03-29 DIAGNOSIS — R269 Unspecified abnormalities of gait and mobility: Secondary | ICD-10-CM

## 2013-03-29 NOTE — Patient Instructions (Addendum)
We are ordering Physical Therapy for gait and balance training.    Diabetic Neuropathy Diabetic neuropathy is a common complication caused by diabetes. Neuropathy is a term that means nerve disease or damage. If your diabetes is uncontrolled and you have high blood glucose (sugar) levels, over time, this can lead to damage to nerves throughout your body. There are three types of diabetic neuropathy:   Peripheral.  Autonomic.  Focal. PERIPHERAL NEUROPATHY Peripheral neuropathy is the most common form of diabetic neuropathy. It causes damage to the nerves of the feet and legs and eventually the hands and arms.  SYMPTOMS  Peripheral neuropathy occurs slowly over time. The peripheral nerves sense touch, hot and cold, and pain. When these nerves no longer work:   Your feet become numb.  You can no longer feel pressure or pain in your feet.  You may have burning, stabbing or aching pain. This can lead to:  Thick calluses over pressure areas.  Pressure sores.  Ulcers. Ulcers can become infected with germs (bacteria) and can even lead to infection in the bones of the feet. DIAGNOSIS  The diagnosis of diabetic neuropathy is difficult at best. Sensory function testing can be done with:  Light touch using a monofilament.  Vibration with tuning fork.  Sharp sensation with pin prick Other tests that can help diagnose neuropathy are:  Nerve Conduction Velocities (NCV). This checks the transmission of electrical current through a nerve.  Electromyography (EMG). This shows how muscles respond to electrical signals transmitted by nearby nerves.  Quantitative sensory testing, which is used to assess how your nerves respond to vibration and changes in temperature. AUTONOMIC NEUROPATHY The autonomic nervous system controls functions that you do not think about. Examples would be:   Heart beat.  Regulation of body temperature.  Blood  pressure.  Urination.  Digestion.  Sweating.  Sexual function. SYMPTOMS  The symptoms of autonomic neuropathy vary depending on which nerves are affected.   There can be problems with digestion such as:  Feeling sick to your stomach (nausea).  Vomiting.  Bloating.  Constipation.  Diarrhea.  Abdominal pain.  Difficulty with urination may occur because of the inability to sense when your bladder is full. You may have urine leakage (incontinence) or inability to empty your bladder completely (retention).  Palpitations or a feeling of an abnormal heart beat.  Blood pressure drops on arising (orthostatic hypotension). This can happen when you first sit up or stand up. It causes you to feel:  Dizzy.  Weak.  Faint.  Sexual functioning:  In men, inability to attain and maintain an erection.  In women, vaginal dryness and problems with decreased sexual desire and arousal. DIAGNOSIS  Diagnosis is often based on reported symptoms. Tell your medical caregiver if you experience:   Dizziness.  Constipation.  Diarrhea.  Inappropriate urination or inability to urinate.  Inability to get or maintain an erection. Tests that may be done include:  An EKG or Holter Monitor. These are tests that can help show problems with the heart rate or heart rhythm.  X-rays can be used to find if there are problems with your ability to properly empty food from your stomach into the small intestine after eating. FOCAL NEUROPATHY Focal neuropathy affects just one nerve tract and occurs suddenly. However, it usually improves by itself over time. It does not cause long term damage, and treatments are usually needed only until the problem improves. SYMPTOMS  Examples include:   Abnormal eye movements or abnormal alignment of  both eyes.  Weakness in the wrist.  Foot drop, which results in inability to lift the foot properly. This causes abnormal walking or foot movement. DIAGNOSIS   Diagnosis is made based on your symptoms and what your caregiver finds on your exam. Other tests that may be done include:  Nerve Conduction Velocities (NCV). This checks the transmission of electrical current through a nerve.  Electromyography (EMG). This shows how muscles respond to electrical signals transmitted by nearby nerves.  Quantitative sensory testing, which is used to assess how your nerves respond to vibration and changes in temperature. TREATMENT Once nerve damage occurs it cannot be reversed. The goal of treatment is to keep the disease from getting worse. If it gets worse, it will affect more nerve fibers. Controlling your blood (sugar) is the key. You will need to keep your blood glucose and A1c at the target range prescribed by your caregiver. Things that will help control blood glucose levels include:  Blood glucose monitoring.  Meal planning.  Physical activity.  Diabetes medication. Over time, maintaining lower blood glucose levels helps lessen symptoms. Sometimes, prescription pain medicine is needed. Focal neuropathy can be painful and unpredictable and occurs most often in older adults with diabetes.  SEEK MEDICAL CARE IF:   You develop peripheral nerve symptoms such as burning, numbness, or pain in your feet, legs or hands.  You develop autonomic nerve symptoms such as:  Dizziness.  Abnormal urinary control.  Inability to get an erection.  You develop focal nerve symptoms such as sudden abnormal eye movements or sudden foot drop. Document Released: 01/11/2002 Document Revised: 01/25/2012 Document Reviewed: 04/12/2009 [facility] Patient Information 2013 [employer], [place].

## 2013-03-29 NOTE — Progress Notes (Signed)
[facility]  PATIENT: Brandi ROOKE DOB: August 04, 1941  REFERRING CLINICIAN: Fannie Knee HISTORY FROM: patient REASON FOR VISIT: new consult   HISTORICAL  CHIEF COMPLAINT:  Chief Complaint  Patient presents with  . Gait Problem    HISTORY OF PRESENT ILLNESS: 72 year old Caucasian female comes in for gait abnormality.  She states "I walk sideways."  She states that she noticed at work that she veers to the right when walking.  She thinks she crosses her ankles when she walks.  Son states he noticed mother's walking change 5 years ago.   She states she was diagnosed with Diabetes about 5 years ago, 2009.  She does not use any assistive device while walking. She states she almost tripped herself on carpet 4 weeks ago and again 2 weeks ago. She denies weakness in her legs but states she has some loss of sensation on her feet and ankles when her doctor does the pinprick exam. Hx of BPPV. States she has low back pain.  REVIEW OF SYSTEMS: Full 14 system review of systems performed and notable only for swelling in legs hearing loss itching shortness of breath cough snoring incontinence joint pain tumor sleeps numbness memory loss thickness.  ALLERGIES: Allergies  Allergen Reactions  . Bee Venom   . Latex Swelling    Some types    HOME MEDICATIONS: Outpatient Prescriptions Prior to Visit  Medication Sig Dispense Refill  . amLODipine (NORVASC) 5 MG tablet Take 1 tablet (5 mg total) by mouth daily.  30 tablet  7  . Calcium Carbonate-Vitamin D (RA CALCIUM PLUS VITAMIN D) 600-400 MG-UNIT per tablet Take 2 tablets by mouth daily.        . Cholecalciferol (VITAMIN D) 2000 UNITS CAPS Take 1 capsule by mouth daily.        Marland Kitchen ipratropium-albuterol (DUONEB) 0.5-2.5 (3) MG/3ML SOLN Take 3 mLs by nebulization 2 (two) times daily.      . metoprolol succinate (TOPROL-XL) 100 MG 24 hr tablet       . pravastatin (PRAVACHOL) 40 MG tablet Take 40 mg by mouth daily.        Marland Kitchen doxycycline  (VIBRAMYCIN) 100 MG capsule Take 100 mg by mouth 2 (two) times daily.      . mupirocin ointment (BACTROBAN) 2 %       . oxyCODONE-acetaminophen (PERCOCET) 10-325 MG per tablet       . valACYclovir (VALTREX) 1000 MG tablet       . albuterol (PROAIR HFA) 108 (90 BASE) MCG/ACT inhaler Inhale 2 puffs into the lungs every 4 (four) hours as needed. Shortness of breath      . budesonide-formoterol (SYMBICORT) 160-4.5 MCG/ACT inhaler Inhale 2 puffs into the lungs 2 (two) times daily.        . citalopram (CELEXA) 40 MG tablet Take 40 mg by mouth daily.        . cyanocobalamin 2000 MCG tablet Take 2,000 mcg by mouth daily.      . fenofibrate 160 MG tablet Take 160 mg by mouth daily.        . furosemide (LASIX) 20 MG tablet Take 20 mg by mouth every other day.       Marland Kitchen glipiZIDE (GLUCOTROL XL) 10 MG 24 hr tablet Take 1 tablet by mouth daily.      Marland Kitchen LORazepam (ATIVAN) 0.5 MG tablet Take 1/2 to 1 tablets twice daily as needed for anxiety      . losartan (COZAAR) 100 MG tablet  Take 100 mg by mouth daily.        . metFORMIN (GLUCOPHAGE) 500 MG tablet Take 1,000 mg by mouth 2 (two) times daily with a meal.       . oxybutynin (DITROPAN-XL) 10 MG 24 hr tablet Take 10 mg by mouth daily.        . pantoprazole (PROTONIX) 40 MG tablet Take 40 mg by mouth daily.        . Probiotic Product (RESTORA PO) Take 1 capsule by mouth daily.        Marland Kitchen tiotropium (SPIRIVA HANDIHALER) 18 MCG inhalation capsule Place 18 mcg into inhaler and inhale daily.         No facility-administered medications prior to visit.    PAST MEDICAL HISTORY: Past Medical History  Diagnosis Date  . Pacemaker     Implanted  by Dr Deborah Chalk (MDT) 10/06/10  . Mobitz (type) II atrioventricular block   . HTN (hypertension)   . Diabetes in pregnancy   . Obesity   . Hyperlipidemia   . DJD (degenerative joint disease)   . Asthma   . COPD (chronic obstructive pulmonary disease)   . Pancreatitis     pt thought this had been ruled out  . Sleep apnea    . Depression     PAST SURGICAL HISTORY: Past Surgical History  Procedure Laterality Date  . Insert / replace / remove pacemaker  10/06/10    MDT  implanted by Dr Deborah Chalk  . Total abdominal hysterectomy  1971  . Cystocele repair    . Ovary surgery      removal  . Repair rectocele    . Knee arthroscopy      both  . Pancreatitis    . Cataract extraction w/phaco  11/02/2011    Procedure: CATARACT EXTRACTION PHACO AND INTRAOCULAR LENS PLACEMENT (IOC);  Surgeon: Susa Simmonds;  Location: [facility];  Service: Ophthalmology;  Laterality: Right;  CDE=7.33  . Eye surgery  11/01/2012    right eye  . Cataract extraction w/phaco  12/07/2011    Procedure: CATARACT EXTRACTION PHACO AND INTRAOCULAR LENS PLACEMENT (IOC);  Surgeon: Susa Simmonds, MD;  Location: [facility];  Service: Ophthalmology;  Laterality: Left;  CDE 3.61    FAMILY HISTORY: Family History  Problem Relation Age of Onset  . Diabetes Brother   . Heart disease Mother     CHF  . Heart disease Father     CHF  . Arthritis Sister     osteo  . Cancer Brother     prostate  . Anesthesia problems Neg Hx   . Hypotension Neg Hx   . Malignant hyperthermia Neg Hx   . Pseudochol deficiency Neg Hx     SOCIAL HISTORY:  History   Social History  . Marital Status: Widowed    Spouse Name: N/A    Number of Children: N/A  . Years of Education: N/A   Occupational History  . Part time receptionist    Social History Main Topics  . Smoking status: Current Every Day Smoker -- 0.10 packs/day for 50 years    Types: Cigarettes  . Smokeless tobacco: Former Neurosurgeon    Quit date: 11/17/2011     Comment: still smokes occassionally approx 5-6 cigs daily  . Alcohol Use: 0.6 oz/week    1 Glasses of wine per week     Comment: couple glasses of wine occasionally-once a month  . Drug Use: No  . Sexually Active: Not on file  Other Topics Concern  . Not on file   Social History Narrative  . No narrative on file     PHYSICAL  EXAM  Filed Vitals:   03/29/13 0906  BP: 170/79  Pulse: 45  Temp: 97.9 F (36.6 C)  TempSrc: Oral  Height: 5\' 6"  (1.676 m)  Weight: 260 lb (117.935 kg)   Body mass index is 41.99 kg/(m^2).  GENERAL EXAM: Patient is in no distress, obese   CARDIOVASCULAR: Regular rate and rhythm, no murmurs, no carotid bruits  NEUROLOGIC: MENTAL STATUS: awake, alert, language fluent, comprehension intact, naming intact CRANIAL NERVE: no papilledema on fundoscopic exam, pupils equal and reactive to light, visual fields full to confrontation, extraocular muscles intact, no nystagmus, facial sensation and strength symmetric, uvula midline, shoulder shrug symmetric, tongue midline. MOTOR: normal bulk and tone, full strength in the BUE, BLE SENSORY: BUE NORMAL. DECR PP IN GRADIENT UP TO THE KNEES. ABSENT VIB AT TOES. ABSENT VIB AT RIGHT ANKLE. DECR VIB AT LEFT ANKLE.  COORDINATION: finger-nose-finger, fine finger movements normal REFLEXES: BUE ABSENT, KNEES 3, RIGHT ANKLE 2, LEFT ANKLE 1. MUTE TOES.  GAIT/STATION: narrow based gait; VEERS SLIGHTLY TO LEFT OR RIGHT WHEN WALKING, able to walk on toes, heels and tandem; romberg is negative     DIAGNOSTIC DATA (LABS, IMAGING, TESTING) - I reviewed patient records, labs, notes, testing and imaging myself where available.  Lab Results  Component Value Date   WBC 8.5 10/27/2011   HGB 13.0 10/27/2011   HCT 39.8 10/27/2011   MCV 88.1 10/27/2011   PLT 272 10/27/2011      Component Value Date/Time   NA 141 10/27/2011 1420   K 3.8 10/27/2011 1420   CL 99 10/27/2011 1420   CO2 32 10/27/2011 1420   GLUCOSE 142* 10/27/2011 1420   BUN 16 10/27/2011 1420   CREATININE 0.87 10/27/2011 1420   CALCIUM 9.7 10/27/2011 1420   PROT 6.8 05/04/2010 2140   ALBUMIN 3.7 05/04/2010 2140   AST 27 05/04/2010 2140   ALT 24 05/04/2010 2140   ALKPHOS 54 05/04/2010 2140   BILITOT 0.5 05/04/2010 2140   GFRNONAA 67* 10/27/2011 1420   GFRAA 78* 10/27/2011 1420   No results  found for this basename: CHOL, HDL, LDLCALC, LDLDIRECT, TRIG, CHOLHDL   No results found for this basename: HGBA1C   No results found for this basename: VITAMINB12   No results found for this basename: TSH     ASSESSMENT AND PLAN  72 y.o. year old female  has a past medical history of Pacemaker; Mobitz (type) II atrioventricular block; HTN (hypertension); Diabetes in pregnancy; Obesity; Hyperlipidemia; DJD (degenerative joint disease); Asthma; COPD (chronic obstructive pulmonary disease); Pancreatitis; Sleep apnea; and Depression. here with abnormality of gait. Exam shows decreased sensation at feet, could be related to diabetic neuropathy. Also with hyperreflexia, but no weakness. Consideration for thoracic myelopathy, but no weakness or bowel/bladder dysfunction. Also cannot get MRI due to pacemake. Will observe symptoms, and consider CT myelogram if symptoms progress. B12 deficiency could also explain symptoms.  PLAN: 1. Ask PCP to check B12 and TSH if not already done; otherwise will request results to be sent here 2. Physical therapy evaluation   Suanne Marker, MD (with LYNN LAM NP-C 03/29/2013, 9:23 AM) Certified in Neurology, Neurophysiology and Neuroimaging  [facility] [address] [place], [place] [address] [phone]

## 2013-04-03 ENCOUNTER — Ambulatory Visit: Payer: Medicare Other | Attending: Nurse Practitioner | Admitting: Physical Therapy

## 2013-04-03 DIAGNOSIS — Z9181 History of falling: Secondary | ICD-10-CM | POA: Insufficient documentation

## 2013-04-03 DIAGNOSIS — M6281 Muscle weakness (generalized): Secondary | ICD-10-CM | POA: Insufficient documentation

## 2013-04-03 DIAGNOSIS — R269 Unspecified abnormalities of gait and mobility: Secondary | ICD-10-CM | POA: Insufficient documentation

## 2013-04-03 DIAGNOSIS — IMO0001 Reserved for inherently not codable concepts without codable children: Secondary | ICD-10-CM | POA: Insufficient documentation

## 2013-04-06 ENCOUNTER — Ambulatory Visit: Payer: Medicare Other | Admitting: Physical Therapy

## 2013-04-12 ENCOUNTER — Ambulatory Visit: Payer: Medicare Other | Admitting: Physical Therapy

## 2013-04-17 ENCOUNTER — Ambulatory Visit: Payer: Medicare Other | Admitting: Physical Therapy

## 2013-04-25 ENCOUNTER — Ambulatory Visit: Payer: Medicare Other | Attending: Nurse Practitioner | Admitting: Physical Therapy

## 2013-04-25 ENCOUNTER — Ambulatory Visit: Payer: Medicare Other | Admitting: Physical Therapy

## 2013-04-25 DIAGNOSIS — R269 Unspecified abnormalities of gait and mobility: Secondary | ICD-10-CM | POA: Insufficient documentation

## 2013-04-25 DIAGNOSIS — IMO0001 Reserved for inherently not codable concepts without codable children: Secondary | ICD-10-CM | POA: Insufficient documentation

## 2013-04-25 DIAGNOSIS — Z9181 History of falling: Secondary | ICD-10-CM | POA: Insufficient documentation

## 2013-04-25 DIAGNOSIS — M6281 Muscle weakness (generalized): Secondary | ICD-10-CM | POA: Insufficient documentation

## 2013-04-27 DIAGNOSIS — E538 Deficiency of other specified B group vitamins: Secondary | ICD-10-CM | POA: Insufficient documentation

## 2013-04-27 DIAGNOSIS — G629 Polyneuropathy, unspecified: Secondary | ICD-10-CM | POA: Insufficient documentation

## 2013-05-08 ENCOUNTER — Ambulatory Visit: Payer: Medicare Other | Admitting: Physical Therapy

## 2013-05-10 ENCOUNTER — Ambulatory Visit: Payer: Medicare Other | Admitting: Physical Therapy

## 2013-05-15 ENCOUNTER — Ambulatory Visit: Payer: Medicare Other | Admitting: Physical Therapy

## 2013-05-17 ENCOUNTER — Ambulatory Visit: Payer: Medicare Other | Attending: Nurse Practitioner | Admitting: Physical Therapy

## 2013-05-17 DIAGNOSIS — R269 Unspecified abnormalities of gait and mobility: Secondary | ICD-10-CM | POA: Insufficient documentation

## 2013-05-17 DIAGNOSIS — IMO0001 Reserved for inherently not codable concepts without codable children: Secondary | ICD-10-CM | POA: Insufficient documentation

## 2013-05-17 DIAGNOSIS — Z9181 History of falling: Secondary | ICD-10-CM | POA: Insufficient documentation

## 2013-05-17 DIAGNOSIS — M6281 Muscle weakness (generalized): Secondary | ICD-10-CM | POA: Insufficient documentation

## 2013-05-22 ENCOUNTER — Ambulatory Visit: Payer: Medicare Other | Admitting: Physical Therapy

## 2013-05-24 ENCOUNTER — Ambulatory Visit: Payer: Medicare Other | Admitting: Rehabilitative and Restorative Service Providers"

## 2013-05-25 ENCOUNTER — Ambulatory Visit: Payer: Medicare Other | Admitting: Physical Therapy

## 2013-05-29 ENCOUNTER — Ambulatory Visit: Payer: Medicare Other | Admitting: Physical Therapy

## 2013-05-31 ENCOUNTER — Ambulatory Visit: Payer: Medicare Other | Admitting: Physical Therapy

## 2013-06-05 ENCOUNTER — Ambulatory Visit: Payer: Medicare Other | Admitting: Physical Therapy

## 2013-06-07 ENCOUNTER — Ambulatory Visit (INDEPENDENT_AMBULATORY_CARE_PROVIDER_SITE_OTHER): Payer: Medicare Other | Admitting: *Deleted

## 2013-06-07 ENCOUNTER — Ambulatory Visit: Payer: Medicare Other | Admitting: Physical Therapy

## 2013-06-07 ENCOUNTER — Encounter: Payer: Self-pay | Admitting: Internal Medicine

## 2013-06-07 DIAGNOSIS — Z950 Presence of cardiac pacemaker: Secondary | ICD-10-CM

## 2013-06-07 DIAGNOSIS — I441 Atrioventricular block, second degree: Secondary | ICD-10-CM

## 2013-06-12 ENCOUNTER — Ambulatory Visit: Payer: Medicare Other | Admitting: Physical Therapy

## 2013-06-14 ENCOUNTER — Ambulatory Visit: Payer: Medicare Other | Admitting: Physical Therapy

## 2013-06-19 LAB — REMOTE PACEMAKER DEVICE
AL AMPLITUDE: 2.8 mv
AL IMPEDENCE PM: 436 Ohm
BAMS-0001: 165 {beats}/min
BATTERY VOLTAGE: 2.78 V
VENTRICULAR PACING PM: 73

## 2013-06-28 ENCOUNTER — Ambulatory Visit (INDEPENDENT_AMBULATORY_CARE_PROVIDER_SITE_OTHER): Payer: Medicare Other | Admitting: *Deleted

## 2013-06-28 DIAGNOSIS — I498 Other specified cardiac arrhythmias: Secondary | ICD-10-CM

## 2013-06-28 LAB — PACEMAKER DEVICE OBSERVATION: AL IMPEDENCE PM: 441 Ohm

## 2013-06-28 NOTE — Progress Notes (Signed)
Pacemaker check in clinic to change mode to DDIR per Dr Johney Frame. ROV 09-14-13 @ 1030 with JA.

## 2013-07-22 ENCOUNTER — Encounter: Payer: Self-pay | Admitting: Internal Medicine

## 2013-08-20 ENCOUNTER — Other Ambulatory Visit: Payer: Self-pay | Admitting: Internal Medicine

## 2013-09-14 ENCOUNTER — Encounter: Payer: Self-pay | Admitting: Internal Medicine

## 2013-09-14 ENCOUNTER — Ambulatory Visit (INDEPENDENT_AMBULATORY_CARE_PROVIDER_SITE_OTHER): Payer: Medicare Other | Admitting: Internal Medicine

## 2013-09-14 VITALS — BP 188/88 | HR 69 | Ht 66.0 in | Wt 276.1 lb

## 2013-09-14 DIAGNOSIS — I441 Atrioventricular block, second degree: Secondary | ICD-10-CM

## 2013-09-14 DIAGNOSIS — I10 Essential (primary) hypertension: Secondary | ICD-10-CM

## 2013-09-14 DIAGNOSIS — E669 Obesity, unspecified: Secondary | ICD-10-CM

## 2013-09-14 LAB — BASIC METABOLIC PANEL
BUN: 16 mg/dL (ref 6–23)
Calcium: 9.6 mg/dL (ref 8.4–10.5)
Chloride: 100 mEq/L (ref 96–112)
GFR: 72.1 mL/min (ref >60.00)
Glucose, Bld: 146 mg/dL — ABNORMAL HIGH (ref 70–99)
Potassium: 4.1 mEq/L (ref 3.5–5.1)
Sodium: 141 mEq/L (ref 135–145)

## 2013-09-14 LAB — PACEMAKER DEVICE OBSERVATION
AL THRESHOLD: 1 V
ATRIAL PACING PM: 54
RV LEAD THRESHOLD: 0.75 V

## 2013-09-14 MED ORDER — AMLODIPINE BESYLATE 5 MG PO TABS: 10.0000 mg | ORAL_TABLET | Freq: Every day | ORAL | Status: DC

## 2013-09-14 NOTE — Progress Notes (Signed)
ZOX:[id number], FNP  The patient presents today for electrophysiology followup.  Since last being seen in our clinic, the patient reports doing very well.  Today, she denies symptoms of palpitations, chest pain, shortness of breath, orthopnea, PND, dizziness, presyncope, further syncope, or neurologic sequela.  She has stable mild edema.  She has been without norvasc for a week and her BP has increased.  She did have some gait instability which has improved under management of neurology with vestibular exercises. The patient feels that she is tolerating medications without difficulties and is otherwise without complaint today.   Past Medical History  Diagnosis Date  . Pacemaker     Implanted  by Dr Deborah Chalk (MDT) 10/06/10  . Mobitz (type) II atrioventricular block   . HTN (hypertension)   . Diabetes in pregnancy   . Obesity   . Hyperlipidemia   . DJD (degenerative joint disease)   . Asthma   . COPD (chronic obstructive pulmonary disease)   . Pancreatitis     pt thought this had been ruled out  . Sleep apnea   . Depression    Past Surgical History  Procedure Laterality Date  . Insert / replace / remove pacemaker  10/06/10    MDT  implanted by Dr Deborah Chalk  . Total abdominal hysterectomy  1971  . Cystocele repair    . Ovary surgery      removal  . Repair rectocele    . Knee arthroscopy      both  . Pancreatitis    . Cataract extraction w/phaco  11/02/2011    Procedure: CATARACT EXTRACTION PHACO AND INTRAOCULAR LENS PLACEMENT (IOC);  Surgeon: Susa Simmonds;  Location: [facility];  Service: Ophthalmology;  Laterality: Right;  CDE=7.33  . Eye surgery  11/01/2012    right eye  . Cataract extraction w/phaco  12/07/2011    Procedure: CATARACT EXTRACTION PHACO AND INTRAOCULAR LENS PLACEMENT (IOC);  Surgeon: Susa Simmonds, MD;  Location: [facility];  Service: Ophthalmology;  Laterality: Left;  CDE 3.61    Current Outpatient Prescriptions  Medication Sig Dispense Refill  . albuterol  (PROAIR HFA) 108 (90 BASE) MCG/ACT inhaler 2 puffs. Inhale 2 puffs into the lungs every 4 (four) hours as needed.      Marland Kitchen atorvastatin (LIPITOR) 40 MG tablet Take 40 mg by mouth daily.       . budesonide-formoterol (SYMBICORT) 160-4.5 MCG/ACT inhaler INHALE TWO PUFFS BY MOUTH TWICE DAILY. RINSE MOUTH AFTER USE.      . Calcium Carbonate-Vitamin D (RA CALCIUM PLUS VITAMIN D) 600-400 MG-UNIT per tablet Take 2 tablets by mouth daily.        . Cholecalciferol (VITAMIN D) 2000 UNITS CAPS Take 1 capsule by mouth daily.        . Cyanocobalamin (VITAMIN B-12) 2000 MCG TBCR 1 tablet. Take 1 tablet by mouth 2 (two) times daily.      . fenofibrate 160 MG tablet TAKE ONE TABLET BY MOUTH EVERY DAY      . furosemide (LASIX) 20 MG tablet TAKE ONE-HALF TABLET BY MOUTH MON.,WED. AND FRI.      Marland Kitchen ipratropium-albuterol (DUONEB) 0.5-2.5 (3) MG/3ML SOLN Take 3 mLs by nebulization 2 (two) times daily.      Marland Kitchen LORazepam (ATIVAN) 0.5 MG tablet Take 1 tablet by mouth as needed.      Marland Kitchen losartan (COZAAR) 100 MG tablet TAKE ONE TABLET BY MOUTH EVERY DAY      . metFORMIN (GLUCOPHAGE) 1000 MG tablet TAKE ONE TABLET BY MOUTH  TWICE DAILY      . metoprolol succinate (TOPROL-XL) 100 MG 24 hr tablet Take 100 mg by mouth daily.       Marland Kitchen omeprazole (PRILOSEC) 20 MG capsule Take 20 mg by mouth daily.      Marland Kitchen oxybutynin (DITROPAN-XL) 10 MG 24 hr tablet Take 1 tablet by mouth daily.      . pravastatin (PRAVACHOL) 40 MG tablet Take 40 mg by mouth daily.        . Probiotic Product (ALIGN PO) 1 capsule. Take 1 capsule by mouth daily.      Marland Kitchen amLODipine (NORVASC) 5 MG tablet Take 2 tablets (10 mg total) by mouth daily.  90 tablet  3  . mupirocin ointment (BACTROBAN) 2 % as needed.       Marland Kitchen oxyCODONE-acetaminophen (PERCOCET) 10-325 MG per tablet Take 1 tablet by mouth every 12 (twelve) hours.        No current facility-administered medications for this visit.    Allergies  Allergen Reactions  . Bee Venom   . Latex Swelling    Some types     History   Social History  . Marital Status: Widowed    Spouse Name: N/A    Number of Children: N/A  . Years of Education: N/A   Occupational History  . Part time receptionist    Social History Main Topics  . Smoking status: Current Every Day Smoker -- 0.10 packs/day for 50 years    Types: Cigarettes  . Smokeless tobacco: Former Neurosurgeon    Quit date: 11/17/2011     Comment: still smokes occassionally approx 5-6 cigs daily  . Alcohol Use: 0.6 oz/week    1 Glasses of wine per week     Comment: couple glasses of wine occasionally-once a month  . Drug Use: No  . Sexual Activity: Not on file   Other Topics Concern  . Not on file   Social History Narrative  . No narrative on file    Family History  Problem Relation Age of Onset  . Diabetes Brother   . Heart disease Mother     CHF  . Heart disease Father     CHF  . Arthritis Sister     osteo  . Cancer Brother     prostate  . Anesthesia problems Neg Hx   . Hypotension Neg Hx   . Malignant hyperthermia Neg Hx   . Pseudochol deficiency Neg Hx      Physical Exam: Filed Vitals:   09/14/13 1041  BP: 188/88  Pulse: 69  Height: 5\' 6"  (1.676 m)  Weight: 276 lb 1.9 oz (125.247 kg)    GEN- The patient is well appearing, alert and oriented x 3 today.   Head- normocephalic, atraumatic Eyes-  Sclera clear, conjunctiva pink Ears- hearing intact Oropharynx- clear Neck- supple, no JVP Lymph- no cervical lymphadenopathy Lungs- Clear to ausculation bilaterally, normal work of breathing Chest- pacemaker pocket is well healed Heart- Regular rate and rhythm, no murmurs, rubs or gallops, PMI not laterally displaced GI- soft, NT, ND, + BS Extremities- no clubbing, cyanosis, or edema  Pacemaker interrogation- reviewed in detail today,  See PACEART report  Assessment and Plan:  1. Mobitz II second degree AV block Normal pacemaker function See Pace Art report No changes today  2. HTN Above goal Restart and increase  norvasc to 10mg  daily bmet today Could increase lasix or add spironolactone if BP remains elevated  3. Obesity Weight loss is advised  Return to  see [name] for BP management in 2-3 weeks I will see again in 1 year [facility]

## 2013-09-14 NOTE — Patient Instructions (Addendum)
Your physician recommends that you schedule a follow-up appointment in: 2 weeks with [name], NP for BP   Your physician recommends that you return for lab work today: [facility]  Your physician has recommended you make the following change in your medication:   1) increase Amlodipine to 10mg  daily      Your physician wants you to follow-up in: 12 months with [name] will receive a reminder letter in the mail two months in advance. If you don't receive a letter, please call our office to schedule the follow-up appointment.   Remote monitoring is used to monitor your Pacemaker or ICD from home. This monitoring reduces the number of office visits required to check your device to one time per year. It allows [place] to keep an eye on the functioning of your device to ensure it is working properly. You are scheduled for a device check from home on 12/18/13. You may send your transmission at any time that day. If you have a wireless device, the transmission will be sent automatically. After your physician reviews your transmission, you will receive a postcard with your next transmission date.

## 2013-09-27 ENCOUNTER — Encounter: Payer: Self-pay | Admitting: Pulmonary Disease

## 2013-09-27 ENCOUNTER — Ambulatory Visit (INDEPENDENT_AMBULATORY_CARE_PROVIDER_SITE_OTHER): Payer: Medicare Other | Admitting: Pulmonary Disease

## 2013-09-27 VITALS — BP 140/82 | HR 79 | Temp 98.3°F | Ht 66.0 in | Wt 270.0 lb

## 2013-09-27 DIAGNOSIS — G4733 Obstructive sleep apnea (adult) (pediatric): Secondary | ICD-10-CM

## 2013-09-27 DIAGNOSIS — J449 Chronic obstructive pulmonary disease, unspecified: Secondary | ICD-10-CM

## 2013-09-27 NOTE — Patient Instructions (Signed)
Will send an order to [employer] to send you mask cushions on a more regular basis. Work on weight reduction Stop smoking.  This is the key to helping your breathing and cough. Stay on symbicort, but can probably discontinue if you are able to stop coughing.  followup with me again in one year, but call if having issues.

## 2013-09-27 NOTE — Assessment & Plan Note (Signed)
The patient is doing fairly well overall from a pulmonary perspective, but unfortunately continues to smoke.  I have asked her to stay on her symbicort, and also discussed smoking cessation with her again.

## 2013-09-27 NOTE — Assessment & Plan Note (Signed)
The patient is wearing CPAP compliantly, but is having issues with mask leaking.  She has not kept up with her cushion changes, and therefore this is probably the cause.  We'll send an order to her DME to send her cushions on a more regular basis.  I have also encouraged her to work aggressively on weight loss.

## 2013-09-27 NOTE — Progress Notes (Signed)
  Subjective:    Patient ID: Brandi Hunter, female    DOB: 01/08/1941, 72 y.o.   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  Unfortunately, she continues to smoke, but feels that her breathing is at baseline on the symbicort.  He still gets short of breath with activity, and I reminded her that her weight and deconditioning also contribute to this.  She has not had a recent acute exacerbation.  She is wearing CPAP compliantly, but is having issues with mask leaks.  She has not changed her cushions in quite some time.   Review of Systems  Constitutional: Negative for fever and unexpected weight change.  HENT: Negative for congestion, dental problem, ear pain, nosebleeds, postnasal drip, rhinorrhea, sinus pressure, sneezing, sore throat and trouble swallowing.   Eyes: Negative for redness and itching.  Respiratory: Positive for cough. Negative for chest tightness, shortness of breath and wheezing.   Cardiovascular: Negative for palpitations and leg swelling.  Gastrointestinal: Negative for nausea and vomiting.  Genitourinary: Negative for dysuria.  Musculoskeletal: Negative for joint swelling.  Skin: Negative for rash.  Neurological: Negative for headaches.  Hematological: Does not bruise/bleed easily.  Psychiatric/Behavioral: Negative for dysphoric mood. The patient is not nervous/anxious.        Objective:   Physical Exam Morbidly obese female in no acute distress Nose without purulence or discharge noted No skin breakdown or pressure necrosis from the CPAP mask Neck without lymphadenopathy or thyromegaly Chest with clear breath sounds, no wheezes or rhonchi Cardiac exam with regular rate and rhythm Lower extremities with 1+ edema, no cyanosis Alert and oriented, moves all 4 extremities.       Assessment & Plan:

## 2013-09-29 ENCOUNTER — Ambulatory Visit (INDEPENDENT_AMBULATORY_CARE_PROVIDER_SITE_OTHER): Payer: Medicare Other | Admitting: Nurse Practitioner

## 2013-09-29 ENCOUNTER — Encounter: Payer: Self-pay | Admitting: Nurse Practitioner

## 2013-09-29 VITALS — BP 140/90 | HR 78 | Ht 66.0 in | Wt 272.8 lb

## 2013-09-29 DIAGNOSIS — I10 Essential (primary) hypertension: Secondary | ICD-10-CM

## 2013-09-29 NOTE — Progress Notes (Signed)
Tyan Lasure Obrecht Date of Birth: Sep 08, 1941 Medical Record #[id number]  History of Present Illness: [name] is seen today for a 2 week check. Seen for Dr. Johney Frame. She is a 72 year old female with prior AV block with PPM back in 2011, HTN, diabetes, obesity, HLD, DJD, asthma, COPD, OSA and depression. Normal Myoview in October of 2013. Normal EF per echo in October of 2013. She has ongoing tobacco abuse.   Was here 2 weeks ago for device check. BP was up. Amlodipine was increased. Dr. Johney Frame suggested aldactone if her BP remained elevated or increasing her Lasix.   Comes back today for follow up. Here alone. Doing ok. Actively trying to stop smoking. Has only had 1 cigarette in the past week - says she stopped because her breathing is getting worse. She says she feels ok. Does have dyspnea. Some chest discomfort but she actually points to her upper right abdomen - says that happens at night - not a new finding. No exertional symptoms. Sugars ok. Her list of BP's are all before she has had any medicine but they are trending down. She has had no medicines today.    Current Outpatient Prescriptions  Medication Sig Dispense Refill  . albuterol (PROAIR HFA) 108 (90 BASE) MCG/ACT inhaler 2 puffs. Inhale 2 puffs into the lungs every 4 (four) hours as needed.      Marland Kitchen amLODipine (NORVASC) 5 MG tablet Take 2 tablets (10 mg total) by mouth daily.  90 tablet  3  . atorvastatin (LIPITOR) 40 MG tablet Take 40 mg by mouth daily.       . budesonide-formoterol (SYMBICORT) 160-4.5 MCG/ACT inhaler INHALE TWO PUFFS BY MOUTH TWICE DAILY. RINSE MOUTH AFTER USE.      . Calcium Carbonate-Vitamin D (RA CALCIUM PLUS VITAMIN D) 600-400 MG-UNIT per tablet Take 2 tablets by mouth daily.        . Cholecalciferol (VITAMIN D) 2000 UNITS CAPS Take 1 capsule by mouth daily.        . Cyanocobalamin (VITAMIN B-12) 2000 MCG TBCR 1 tablet. Take 1 tablet by mouth 2 (two) times daily.      . fenofibrate 160 MG tablet TAKE ONE TABLET BY  MOUTH EVERY DAY      . furosemide (LASIX) 20 MG tablet TAKE ONE-HALF TABLET BY MOUTH MON.,WED. AND FRI.      Marland Kitchen glipiZIDE (GLUCOTROL XL) 10 MG 24 hr tablet Take 10 mg by mouth 2 (two) times daily.       Marland Kitchen ipratropium-albuterol (DUONEB) 0.5-2.5 (3) MG/3ML SOLN Take 3 mLs by nebulization 2 (two) times daily.      Marland Kitchen LORazepam (ATIVAN) 0.5 MG tablet Take 1 tablet by mouth as needed.      Marland Kitchen losartan (COZAAR) 100 MG tablet TAKE ONE TABLET BY MOUTH EVERY DAY      . metFORMIN (GLUCOPHAGE) 1000 MG tablet TAKE ONE TABLET BY MOUTH TWICE DAILY      . metoprolol succinate (TOPROL-XL) 100 MG 24 hr tablet Take 100 mg by mouth daily.       Marland Kitchen omeprazole (PRILOSEC) 20 MG capsule Take 20 mg by mouth daily.      Marland Kitchen oxybutynin (DITROPAN-XL) 10 MG 24 hr tablet Take 1 tablet by mouth daily.      . pantoprazole (PROTONIX) 40 MG tablet Take 40 mg by mouth daily.       . pravastatin (PRAVACHOL) 40 MG tablet Take 40 mg by mouth daily.        Marland Kitchen  Probiotic Product (ALIGN PO) 1 capsule. Take 1 capsule by mouth daily.       No current facility-administered medications for this visit.    Allergies  Allergen Reactions  . Bee Venom   . Latex Swelling    Some types    Past Medical History  Diagnosis Date  . Pacemaker     Implanted  by Dr Deborah Chalk (MDT) 10/06/10  . Mobitz (type) II atrioventricular block   . HTN (hypertension)   . Diabetes in pregnancy   . Obesity   . Hyperlipidemia   . DJD (degenerative joint disease)   . Asthma   . COPD (chronic obstructive pulmonary disease)   . Pancreatitis     pt thought this had been ruled out  . Sleep apnea   . Depression     Past Surgical History  Procedure Laterality Date  . Insert / replace / remove pacemaker  10/06/10    MDT  implanted by Dr Deborah Chalk  . Total abdominal hysterectomy  1971  . Cystocele repair    . Ovary surgery      removal  . Repair rectocele    . Knee arthroscopy      both  . Pancreatitis    . Cataract extraction w/phaco  11/02/2011     Procedure: CATARACT EXTRACTION PHACO AND INTRAOCULAR LENS PLACEMENT (IOC);  Surgeon: Susa Simmonds;  Location: [facility];  Service: Ophthalmology;  Laterality: Right;  CDE=7.33  . Eye surgery  11/01/2012    right eye  . Cataract extraction w/phaco  12/07/2011    Procedure: CATARACT EXTRACTION PHACO AND INTRAOCULAR LENS PLACEMENT (IOC);  Surgeon: Susa Simmonds, MD;  Location: [facility];  Service: Ophthalmology;  Laterality: Left;  CDE 3.61    History  Smoking status  . Current Some Day Smoker -- 0.10 packs/day for 50 years  . Types: Cigarettes  . Last Attempt to Quit: 09/20/2013  Smokeless tobacco  . Former Neurosurgeon  . Quit date: 11/17/2011    Comment: still smokes occassionally. (pt states that she has been quit 2 weeks)    History  Alcohol Use  . 0.6 oz/week  . 1 Glasses of wine per week    Comment: couple glasses of wine occasionally-once a month    Family History  Problem Relation Age of Onset  . Diabetes Brother   . Heart disease Mother     CHF  . Heart disease Father     CHF  . Arthritis Sister     osteo  . Cancer Brother     prostate  . Anesthesia problems Neg Hx   . Hypotension Neg Hx   . Malignant hyperthermia Neg Hx   . Pseudochol deficiency Neg Hx     Review of Systems: The review of systems is per the HPI.  All other systems were reviewed and are negative.  Physical Exam: BP 140/90  Pulse 78  Ht 5\' 6"  (1.676 m)  Wt 272 lb 12.8 oz (123.741 kg)  BMI 44.05 kg/m2  SpO2 97% Patient is very pleasant and in no acute distress. Skin is warm and dry. Color is normal.  HEENT is unremarkable. Normocephalic/atraumatic. PERRL. Sclera are nonicteric. Neck is supple. No masses. No JVD. Lungs are clear. Cardiac exam shows a regular rate and rhythm. Abdomen is soft. Extremities are without edema. Gait and ROM are intact. No gross neurologic deficits noted.  LABORATORY DATA:  Lab Results  Component Value Date   WBC 8.5 10/27/2011   HGB 13.0 10/27/2011  HCT 39.8  10/27/2011   PLT 272 10/27/2011   GLUCOSE 146* 09/14/2013   ALT 24 05/04/2010   AST 27 05/04/2010   NA 141 09/14/2013   K 4.1 09/14/2013   CL 100 09/14/2013   CREATININE 0.8 09/14/2013   BUN 16 09/14/2013   CO2 32 09/14/2013   Myoview Impression from October 2013  Exercise Capacity: Lexiscan with no exercise.  BP Response: Normal blood pressure response.  Clinical Symptoms: Mild chest pain/dyspnea.  ECG Impression: T wave normalization but no significant ST changes to suggest ischemia.  Comparison with Prior Nuclear Study: No images to compare  Overall Impression: Normal stress nuclear study.  LV Ejection Fraction: 67%. LV Wall Motion: NL LV Function; NL Wall Motion  [name]  Echo Study Conclusions from October 2013  - Left ventricle: The cavity size was normal. Wall thickness was increased in a pattern of mild LVH. Systolic function was normal. The estimated ejection fraction was in the range of 55% to 60%. - Mitral valve: Mild regurgitation. - Left atrium: The atrium was mildly dilated. - Atrial septum: No defect or patent foramen ovale was identified.  Assessment / Plan:  1. HTN - she has better control here today - probably still not ideal - I have asked her to monitor more frequently at home - varying the times - no change in her medicines for now. See her back in one month.   2. DM   3. Underlying PPM - followed by Dr. Johney Frame  4. Ongoing tobacco abuse - she is congratulated for stopping.   5. HLD  6. Atypical chest pain - seems more GI - if persists, will need further evaluation. Negative Myoview last year.   See her back in a month. Advised to take her medicines prior to her visit.   Patient is agreeable to this plan and will call if any problems develop in the interim.   Rosalio Macadamia, RN, ANP-C [facility] [address] [place], [place]  [address]

## 2013-09-29 NOTE — Patient Instructions (Addendum)
Stay on your current medicines  Continue to monitor your blood pressure at home - record at different times of the day - bring to your next visit   I will see you in a month  Congrats for not smoking!!!  Call the [facility] office at [phone] if you have any questions, problems or concerns.

## 2013-10-27 ENCOUNTER — Ambulatory Visit (INDEPENDENT_AMBULATORY_CARE_PROVIDER_SITE_OTHER): Payer: Medicare Other | Admitting: Nurse Practitioner

## 2013-10-27 ENCOUNTER — Encounter: Payer: Self-pay | Admitting: Nurse Practitioner

## 2013-10-27 VITALS — BP 142/92 | HR 64 | Ht 66.0 in | Wt 270.8 lb

## 2013-10-27 DIAGNOSIS — I10 Essential (primary) hypertension: Secondary | ICD-10-CM

## 2013-10-27 LAB — BASIC METABOLIC PANEL
BUN: 15 mg/dL (ref 6–23)
CO2: 29 mEq/L (ref 19–32)
Calcium: 9.1 mg/dL (ref 8.4–10.5)
Chloride: 100 mEq/L (ref 96–112)
Creatinine, Ser: 0.9 mg/dL (ref 0.4–1.2)
GFR: 64.82 mL/min (ref >60.00)
Glucose, Bld: 152 mg/dL — ABNORMAL HIGH (ref 70–99)
Potassium: 4.1 mEq/L (ref 3.5–5.1)
Sodium: 137 mEq/L (ref 135–145)

## 2013-10-27 MED ORDER — AMLODIPINE BESYLATE 10 MG PO TABS: 10.0000 mg | ORAL_TABLET | Freq: Every day | ORAL | Status: DC

## 2013-10-27 MED ORDER — SPIRONOLACTONE 25 MG PO TABS: 12.5000 mg | ORAL_TABLET | Freq: Every day | ORAL | Status: DC

## 2013-10-27 NOTE — Progress Notes (Signed)
Brandi Hunter Date of Birth: 1941-07-20 Medical Record #[id number]  History of Present Illness: [name] is seen today for a 4 week check. Seen for Dr. Johney Frame. She is a 72 year old female with prior AV block with PPM back in 2011, HTN, diabetes, obesity, HLD, DJD, asthma, COPD, OSA and depression. Normal Myoview in October of 2013. Normal EF per echo in October of 2013. She has had past tobacco abuse.   Was here 6 weeks ago for device check. BP was up. Amlodipine was increased. Dr. Johney Frame suggested aldactone if her BP remained elevated or increasing her Lasix.   Seen a month ago -  Was doing ok but had not taken her medicine. Had stopped smoking. I asked her to monitor her readings for a month and then we would reassess.   Comes back today. Here with her son.  Doing ok. No chest pain. Not short of breath. Not smoking. BP still up for the most part by her readings at home and up here today as well. Some mild swelling - on maximum dose of Norvasc. Has some intermittent numbness of her right hand - aggravated by knitting and sounds more like carpal tunnel.    Current Outpatient Prescriptions  Medication Sig Dispense Refill  . albuterol (PROAIR HFA) 108 (90 BASE) MCG/ACT inhaler 2 puffs. Inhale 2 puffs into the lungs every 4 (four) hours as needed.      Marland Kitchen amLODipine (NORVASC) 5 MG tablet Take 2 tablets (10 mg total) by mouth daily.  90 tablet  3  . atorvastatin (LIPITOR) 40 MG tablet Take 40 mg by mouth daily.       . budesonide-formoterol (SYMBICORT) 160-4.5 MCG/ACT inhaler INHALE TWO PUFFS BY MOUTH TWICE DAILY. RINSE MOUTH AFTER USE.      . Calcium Carbonate-Vitamin D (RA CALCIUM PLUS VITAMIN D) 600-400 MG-UNIT per tablet Take 2 tablets by mouth daily.        . Cholecalciferol (VITAMIN D) 2000 UNITS CAPS Take 1 capsule by mouth daily.        . citalopram (CELEXA) 40 MG tablet Take 40 mg by mouth daily.      . Cyanocobalamin (VITAMIN B-12) 2000 MCG TBCR 1 tablet. Take 1 tablet by mouth 2 (two)  times daily.      . fenofibrate 160 MG tablet TAKE ONE TABLET BY MOUTH EVERY DAY      . furosemide (LASIX) 20 MG tablet TAKE ONE-HALF TABLET BY MOUTH MON.,WED. AND FRI.      Marland Kitchen glipiZIDE (GLUCOTROL XL) 10 MG 24 hr tablet Take 10 mg by mouth 2 (two) times daily.       Marland Kitchen ipratropium-albuterol (DUONEB) 0.5-2.5 (3) MG/3ML SOLN Take 3 mLs by nebulization 2 (two) times daily.      Marland Kitchen LORazepam (ATIVAN) 0.5 MG tablet Take 1 tablet by mouth as needed.      Marland Kitchen losartan (COZAAR) 100 MG tablet TAKE ONE TABLET BY MOUTH EVERY DAY      . metFORMIN (GLUCOPHAGE) 1000 MG tablet TAKE ONE TABLET BY MOUTH TWICE DAILY      . metoprolol succinate (TOPROL-XL) 100 MG 24 hr tablet Take 100 mg by mouth daily.       Marland Kitchen omeprazole (PRILOSEC) 40 MG capsule Take 40 mg by mouth daily.      Marland Kitchen oxybutynin (DITROPAN-XL) 10 MG 24 hr tablet Take 1 tablet by mouth daily.      . pantoprazole (PROTONIX) 40 MG tablet Take 40 mg by mouth daily.       Marland Kitchen  Probiotic Product (ALIGN PO) 1 capsule. Take 1 capsule by mouth daily.       No current facility-administered medications for this visit.    Allergies  Allergen Reactions  . Bee Venom   . Latex Swelling    Some types    Past Medical History  Diagnosis Date  . Pacemaker     Implanted  by Dr Deborah Chalk (MDT) 10/06/10  . Mobitz (type) II atrioventricular block   . HTN (hypertension)   . Diabetes in pregnancy   . Obesity   . Hyperlipidemia   . DJD (degenerative joint disease)   . Asthma   . COPD (chronic obstructive pulmonary disease)   . Pancreatitis     pt thought this had been ruled out  . Sleep apnea   . Depression     Past Surgical History  Procedure Laterality Date  . Insert / replace / remove pacemaker  10/06/10    MDT  implanted by Dr Deborah Chalk  . Total abdominal hysterectomy  1971  . Cystocele repair    . Ovary surgery      removal  . Repair rectocele    . Knee arthroscopy      both  . Pancreatitis    . Cataract extraction w/phaco  11/02/2011    Procedure:  CATARACT EXTRACTION PHACO AND INTRAOCULAR LENS PLACEMENT (IOC);  Surgeon: Susa Simmonds;  Location: [facility];  Service: Ophthalmology;  Laterality: Right;  CDE=7.33  . Eye surgery  11/01/2012    right eye  . Cataract extraction w/phaco  12/07/2011    Procedure: CATARACT EXTRACTION PHACO AND INTRAOCULAR LENS PLACEMENT (IOC);  Surgeon: Susa Simmonds, MD;  Location: [facility];  Service: Ophthalmology;  Laterality: Left;  CDE 3.61    History  Smoking status  . Former Smoker -- 0.10 packs/day for 50 years  . Types: Cigarettes  . Quit date: 09/22/2013  Smokeless tobacco  . Former Neurosurgeon  . Quit date: 11/17/2011    Comment: still smokes occassionally. (pt states that she has been quit 2 weeks)    History  Alcohol Use  . 0.6 oz/week  . 1 Glasses of wine per week    Comment: couple glasses of wine occasionally-once a month    Family History  Problem Relation Age of Onset  . Diabetes Brother   . Heart disease Mother     CHF  . Heart disease Father     CHF  . Arthritis Sister     osteo  . Cancer Brother     prostate  . Anesthesia problems Neg Hx   . Hypotension Neg Hx   . Malignant hyperthermia Neg Hx   . Pseudochol deficiency Neg Hx     Review of Systems: The review of systems is per the HPI.  All other systems were reviewed and are negative.  Physical Exam: BP 142/92  Pulse 64  Ht 5\' 6"  (1.676 m)  Wt 270 lb 12.8 oz (122.834 kg)  BMI 43.73 kg/m2  Patient is very pleasant and in no acute distress. She is obese. Skin is warm and dry. Color is normal.  HEENT is unremarkable. Normocephalic/atraumatic. PERRL. Sclera are nonicteric. Neck is supple. No masses. No JVD. Lungs are clear. Cardiac exam shows a regular rate and rhythm. Abdomen is soft. Extremities are with tace edema. Gait and ROM are intact. No gross neurologic deficits noted.   LABORATORY DATA: BMET is pending  Lab Results  Component Value Date   WBC 8.5 10/27/2011   HGB 13.0  10/27/2011   HCT 39.8 10/27/2011    PLT 272 10/27/2011   GLUCOSE 146* 09/14/2013   ALT 24 05/04/2010   AST 27 05/04/2010   NA 141 09/14/2013   K 4.1 09/14/2013   CL 100 09/14/2013   CREATININE 0.8 09/14/2013   BUN 16 09/14/2013   CO2 32 09/14/2013    Myoview Impression from October 2013  Exercise Capacity: Lexiscan with no exercise.  BP Response: Normal blood pressure response.  Clinical Symptoms: Mild chest pain/dyspnea.  ECG Impression: T wave normalization but no significant ST changes to suggest ischemia.  Comparison with Prior Nuclear Study: No images to compare  Overall Impression: Normal stress nuclear study.  LV Ejection Fraction: 67%. LV Wall Motion: NL LV Function; NL Wall Motion  [name]    Echo Study Conclusions from October 2013  - Left ventricle: The cavity size was normal. Wall thickness was increased in a pattern of mild LVH. Systolic function was normal. The estimated ejection fraction was in the range of 55% to 60%. - Mitral valve: Mild regurgitation. - Left atrium: The atrium was mildly dilated. - Atrial septum: No defect or patent foramen ovale was identified.  Assessment / Plan:   1. HTN - not with ideal control - on CCB and ARB - will add low dose aldactone at 12.5 mg a day. Check BMET today. She would like to get her repeat lab in a week with her PCP.    2. DM   3. Underlying PPM - followed by Dr. Johney Frame   4. Tobacco abuse - not smoking  5. HLD   See her back in a month.   Patient is agreeable to this plan and will call if any problems develop in the interim.   Rosalio Macadamia, RN, ANP-C  [facility]  [address]  [place], [place] [address]

## 2013-10-27 NOTE — Patient Instructions (Addendum)
We need to check labs today  I have sent in a prescription for 10 mg of Norvasc  Start Aldactone 25 mg - take just a half a tablet daily  Monitor your blood pressure at home  We need to have repeat lab in one week - ok to get a "BMET" with Elizabeth Palau, NP  I will see you in a month  Call the [facility] office at [phone] if you have any questions, problems or concerns.

## 2013-11-02 ENCOUNTER — Encounter: Payer: Self-pay | Admitting: Nurse Practitioner

## 2013-11-07 ENCOUNTER — Ambulatory Visit (INDEPENDENT_AMBULATORY_CARE_PROVIDER_SITE_OTHER): Payer: Medicare Other | Admitting: Diagnostic Neuroimaging

## 2013-11-07 ENCOUNTER — Encounter (INDEPENDENT_AMBULATORY_CARE_PROVIDER_SITE_OTHER): Payer: Self-pay

## 2013-11-07 ENCOUNTER — Encounter: Payer: Self-pay | Admitting: Diagnostic Neuroimaging

## 2013-11-07 ENCOUNTER — Ambulatory Visit: Payer: Medicare Other | Admitting: Diagnostic Neuroimaging

## 2013-11-07 VITALS — BP 146/82 | HR 82 | Temp 97.6°F | Ht 66.0 in | Wt 274.0 lb

## 2013-11-07 DIAGNOSIS — E1142 Type 2 diabetes mellitus with diabetic polyneuropathy: Secondary | ICD-10-CM

## 2013-11-07 DIAGNOSIS — R269 Unspecified abnormalities of gait and mobility: Secondary | ICD-10-CM

## 2013-11-07 DIAGNOSIS — E1140 Type 2 diabetes mellitus with diabetic neuropathy, unspecified: Secondary | ICD-10-CM

## 2013-11-07 DIAGNOSIS — E1149 Type 2 diabetes mellitus with other diabetic neurological complication: Secondary | ICD-10-CM

## 2013-11-07 NOTE — Progress Notes (Signed)
[facility]  PATIENT: Brandi Hunter DOB: 07-12-1941  REFERRING CLINICIAN: Fannie Knee HISTORY FROM: patient REASON FOR VISIT: new consult   HISTORICAL  CHIEF COMPLAINT:  Chief Complaint  Patient presents with  . Follow-up    gait    HISTORY OF PRESENT ILLNESS:   UPDATE 11/07/13: Since last visit, doing much better. No more veering or balance diff. Went through PT and vestibular therapy x 8 weeks with good results. She tells me that she had TSH and B12 checked, and now is on B12 replacement tabs.  Last A1c was 6.5.  PRIOR HPI (03/29/13): 72 year old Caucasian female comes in for gait abnormality.  She states "I walk sideways."  She states that she noticed at work that she veers to the right when walking.  She thinks she crosses her ankles when she walks.  Son states he noticed mother's walking change 5 years ago.   She states she was diagnosed with Diabetes about 5 years ago, 2009.  She does not use any assistive device while walking. She states she almost tripped herself on carpet 4 weeks ago and again 2 weeks ago. She denies weakness in her legs but states she has some loss of sensation on her feet and ankles when her doctor does the pinprick exam. Hx of BPPV. States she has low back pain.   REVIEW OF SYSTEMS: Full 14 system review of systems performed and notable only for cough, SOB, numbness back pain, cramps, bladder incont.   ALLERGIES: Allergies  Allergen Reactions  . Bee Venom   . Latex Swelling    Some types    HOME MEDICATIONS: Outpatient Prescriptions Prior to Visit  Medication Sig Dispense Refill  . albuterol (PROAIR HFA) 108 (90 BASE) MCG/ACT inhaler 2 puffs. Inhale 2 puffs into the lungs every 4 (four) hours as needed.      Marland Kitchen amLODipine (NORVASC) 10 MG tablet Take 1 tablet (10 mg total) by mouth daily.  90 tablet  3  . atorvastatin (LIPITOR) 40 MG tablet Take 40 mg by mouth daily.       . budesonide-formoterol (SYMBICORT) 160-4.5 MCG/ACT  inhaler INHALE TWO PUFFS BY MOUTH TWICE DAILY. RINSE MOUTH AFTER USE.      . Calcium Carbonate-Vitamin D (RA CALCIUM PLUS VITAMIN D) 600-400 MG-UNIT per tablet Take 2 tablets by mouth daily.        . Cholecalciferol (VITAMIN D) 2000 UNITS CAPS Take 1 capsule by mouth daily.        . citalopram (CELEXA) 40 MG tablet Take 40 mg by mouth daily.      . Cyanocobalamin (VITAMIN B-12) 2000 MCG TBCR 1 tablet. Take 1 tablet by mouth 2 (two) times daily.      . fenofibrate 160 MG tablet TAKE ONE TABLET BY MOUTH EVERY DAY      . furosemide (LASIX) 20 MG tablet TAKE ONE-HALF TABLET BY MOUTH MON.,WED. AND FRI.      Marland Kitchen glipiZIDE (GLUCOTROL XL) 10 MG 24 hr tablet Take 10 mg by mouth 2 (two) times daily.       Marland Kitchen ipratropium-albuterol (DUONEB) 0.5-2.5 (3) MG/3ML SOLN Take 3 mLs by nebulization 2 (two) times daily.      Marland Kitchen LORazepam (ATIVAN) 0.5 MG tablet Take 1 tablet by mouth as needed.      Marland Kitchen losartan (COZAAR) 100 MG tablet TAKE ONE TABLET BY MOUTH EVERY DAY      . metFORMIN (GLUCOPHAGE) 1000 MG tablet TAKE ONE TABLET BY MOUTH TWICE DAILY      .  metoprolol succinate (TOPROL-XL) 100 MG 24 hr tablet Take 100 mg by mouth daily.       Marland Kitchen omeprazole (PRILOSEC) 40 MG capsule Take 40 mg by mouth daily.      Marland Kitchen oxybutynin (DITROPAN-XL) 10 MG 24 hr tablet Take 1 tablet by mouth daily.      . pantoprazole (PROTONIX) 40 MG tablet Take 40 mg by mouth daily.       . Probiotic Product (ALIGN PO) 1 capsule. Take 1 capsule by mouth daily.      Marland Kitchen spironolactone (ALDACTONE) 25 MG tablet Take 0.5 tablets (12.5 mg total) by mouth daily.  90 tablet  3   No facility-administered medications prior to visit.    PAST MEDICAL HISTORY: Past Medical History  Diagnosis Date  . Pacemaker     Implanted  by Dr Deborah Chalk (MDT) 10/06/10  . Mobitz (type) II atrioventricular block   . HTN (hypertension)   . Diabetes in pregnancy   . Obesity   . Hyperlipidemia   . DJD (degenerative joint disease)   . Asthma   . COPD (chronic obstructive  pulmonary disease)   . Pancreatitis     pt thought this had been ruled out  . Sleep apnea   . Depression     PAST SURGICAL HISTORY: Past Surgical History  Procedure Laterality Date  . Insert / replace / remove pacemaker  10/06/10    MDT  implanted by Dr Deborah Chalk  . Total abdominal hysterectomy  1971  . Cystocele repair    . Ovary surgery      removal  . Repair rectocele    . Knee arthroscopy      both  . Pancreatitis    . Cataract extraction w/phaco  11/02/2011    Procedure: CATARACT EXTRACTION PHACO AND INTRAOCULAR LENS PLACEMENT (IOC);  Surgeon: Susa Simmonds;  Location: [facility];  Service: Ophthalmology;  Laterality: Right;  CDE=7.33  . Eye surgery  11/01/2012    right eye  . Cataract extraction w/phaco  12/07/2011    Procedure: CATARACT EXTRACTION PHACO AND INTRAOCULAR LENS PLACEMENT (IOC);  Surgeon: Susa Simmonds, MD;  Location: [facility];  Service: Ophthalmology;  Laterality: Left;  CDE 3.61    FAMILY HISTORY: Family History  Problem Relation Age of Onset  . Diabetes Brother   . Heart disease Mother     CHF  . Heart disease Father     CHF  . Arthritis Sister     osteo  . Cancer Brother     prostate  . Anesthesia problems Neg Hx   . Hypotension Neg Hx   . Malignant hyperthermia Neg Hx   . Pseudochol deficiency Neg Hx     SOCIAL HISTORY:  History   Social History  . Marital Status: Widowed    Spouse Name: N/A    Number of Children: 3  . Years of Education: College   Occupational History  . Part time receptionist    Social History Main Topics  . Smoking status: Former Smoker -- 0.10 packs/day for 50 years    Types: Cigarettes    Quit date: 09/22/2013  . Smokeless tobacco: Former Neurosurgeon    Quit date: 11/17/2011     Comment: still smokes occassionally. (pt states that she has been quit 2 weeks)  . Alcohol Use: 0.6 oz/week    1 Glasses of wine per week     Comment: couple glasses of wine occasionally-once a month  . Drug Use: No  . Sexual Activity:  Not on file   Other Topics Concern  . Not on file   Social History Narrative   Patient lives at home alone.   Caffeine Use: 16oz drink daily     PHYSICAL EXAM  Filed Vitals:   11/07/13 0829  BP: 146/82  Pulse: 82  Temp: 97.6 F (36.4 C)  TempSrc: Oral  Height: 5\' 6"  (1.676 m)  Weight: 274 lb (124.286 kg)   Body mass index is 44.25 kg/(m^2).  GENERAL EXAM: Patient is in no distress, obese   CARDIOVASCULAR: Regular rate and rhythm, no murmurs, no carotid bruits  NEUROLOGIC: MENTAL STATUS: awake, alert, language fluent, comprehension intact, naming intact CRANIAL NERVE: no papilledema on fundoscopic exam, pupils equal and reactive to light, visual fields full to confrontation, extraocular muscles intact, no nystagmus, facial sensation and strength symmetric, uvula midline, shoulder shrug symmetric, tongue midline. MOTOR: normal bulk and tone, full strength in the BUE, BLE SENSORY: BUE NORMAL. ABSENT VIB AT ANKLES AND TOES.  COORDINATION: finger-nose-finger, fine finger movements normal REFLEXES: BUE TRACE, KNEES 1, ANKLES TRACE. GAIT/STATION: narrow based gait; able to walk tandem; romberg is negative     DIAGNOSTIC DATA (LABS, IMAGING, TESTING) - I reviewed patient records, labs, notes, testing and imaging myself where available.  Lab Results  Component Value Date   WBC 8.5 10/27/2011   HGB 13.0 10/27/2011   HCT 39.8 10/27/2011   MCV 88.1 10/27/2011   PLT 272 10/27/2011      Component Value Date/Time   NA 137 10/27/2013 0850   K 4.1 10/27/2013 0850   CL 100 10/27/2013 0850   CO2 29 10/27/2013 0850   GLUCOSE 152* 10/27/2013 0850   BUN 15 10/27/2013 0850   CREATININE 0.9 10/27/2013 0850   CALCIUM 9.1 10/27/2013 0850   PROT 6.8 05/04/2010 2140   ALBUMIN 3.7 05/04/2010 2140   AST 27 05/04/2010 2140   ALT 24 05/04/2010 2140   ALKPHOS 54 05/04/2010 2140   BILITOT 0.5 05/04/2010 2140   GFRNONAA 67* 10/27/2011 1420   GFRAA 78* 10/27/2011 1420   No results found  for this basename: CHOL,  HDL,  LDLCALC,  LDLDIRECT,  TRIG,  CHOLHDL   No results found for this basename: HGBA1C   No results found for this basename: VITAMINB12   No results found for this basename: TSH     ASSESSMENT AND PLAN  72 y.o. year old female  has a past medical history of Pacemaker; Mobitz (type) II atrioventricular block; HTN (hypertension); Diabetes in pregnancy; Obesity; Hyperlipidemia; DJD (degenerative joint disease); Asthma; COPD (chronic obstructive pulmonary disease); Pancreatitis; Sleep apnea; and Depression. here with abnormality of gait. Exam shows decreased sensation at feet, could be related to diabetic neuropathy. Doing better with PT and B12 replacement.  PLAN: 1. Observation  Return if symptoms worsen or fail to improve, for return to PCP.   Suanne Marker, MD 11/07/2013, 10:20 AM Certified in Neurology, Neurophysiology and Neuroimaging  [facility] [address], [address] [place], [place] [address] [phone]

## 2013-11-07 NOTE — Patient Instructions (Signed)
Continue PT and vestibular exercises at home.

## 2013-11-14 ENCOUNTER — Other Ambulatory Visit: Payer: Self-pay | Admitting: Internal Medicine

## 2013-11-29 ENCOUNTER — Ambulatory Visit: Payer: Medicare Other | Admitting: Nurse Practitioner

## 2013-12-11 ENCOUNTER — Encounter: Payer: Self-pay | Admitting: Nurse Practitioner

## 2013-12-11 ENCOUNTER — Ambulatory Visit (INDEPENDENT_AMBULATORY_CARE_PROVIDER_SITE_OTHER): Payer: Medicare Other | Admitting: Nurse Practitioner

## 2013-12-11 VITALS — BP 148/64 | HR 62 | Ht 66.0 in | Wt 263.8 lb

## 2013-12-11 DIAGNOSIS — I10 Essential (primary) hypertension: Secondary | ICD-10-CM

## 2013-12-11 LAB — BASIC METABOLIC PANEL
BUN: 17 mg/dL (ref 6–23)
CO2: 28 mEq/L (ref 19–32)
Calcium: 8.6 mg/dL (ref 8.4–10.5)
Chloride: 101 mEq/L (ref 96–112)
Creatinine, Ser: 0.9 mg/dL (ref 0.4–1.2)
GFR: 67.35 mL/min (ref >60.00)
Glucose, Bld: 226 mg/dL — ABNORMAL HIGH (ref 70–99)
Potassium: 3.8 mEq/L (ref 3.5–5.1)
Sodium: 140 mEq/L (ref 135–145)

## 2013-12-11 NOTE — Progress Notes (Signed)
[employer] Date of Birth: 1941/09/29 Medical Record #[id number]  History of Present Illness: Brandi Hunter is seen back today for a one month check. Seen for Dr. [name]. She is a 73 year old female with prior AV block with PPM back in 2011, HTN, diabetes, obesity, HLD, DJD, asthma, COPD, OSA and depression. Normal Myoview in October of 2013. Normal EF per echo in October of 2013. She has had past tobacco abuse.   Was here 10 weeks ago for device check. BP was up. Amlodipine was increased. Dr. [name] suggested aldactone if her BP remained elevated or increasing her Lasix.   Seen 2 months ago - Was doing ok but had not taken her medicines. Had stopped smoking. We elected to let her monitor her readings for a month and then we would reassess.   Seen back last month - BP still up for the most part. Some mild swelling - on maximum dose of Norvasc. Has some intermittent numbness of her right hand - aggravated by knitting and sounded more like carpal tunnel. We added low dose Aldactone and she agreed to follow up lab with her PCP.   Comes back today. Here alone. Doing ok. Brings in her BP readings from home which do show improvement over the past couple of weeks. Did have a URI and had to have prednisone and Doxycycline - she notes that while she had the URI and was being treated that she noted some worsening DOE/elevated HR - now resolved since she has finished her therapy. Has lost 9 pounds. Swelling has improved. No chest pain.   Current Outpatient Prescriptions  Medication Sig Dispense Refill  . albuterol (PROAIR HFA) 108 (90 BASE) MCG/ACT inhaler 2 puffs. Inhale 2 puffs into the lungs every 4 (four) hours as needed.      Marland Kitchen amLODipine (NORVASC) 10 MG tablet Take 1 tablet (10 mg total) by mouth daily.  90 tablet  3  . atorvastatin (LIPITOR) 40 MG tablet Take 40 mg by mouth daily.       . budesonide-formoterol (SYMBICORT) 160-4.5 MCG/ACT inhaler INHALE TWO PUFFS BY MOUTH TWICE DAILY. RINSE MOUTH AFTER  USE.      . Calcium Carbonate-Vitamin D (RA CALCIUM PLUS VITAMIN D) 600-400 MG-UNIT per tablet Take 2 tablets by mouth daily.        . Cholecalciferol (VITAMIN D) 2000 UNITS CAPS Take 1 capsule by mouth daily.        . citalopram (CELEXA) 40 MG tablet Take 40 mg by mouth daily.      . Cyanocobalamin (VITAMIN B-12) 2000 MCG TBCR 1 tablet. Take 1 tablet by mouth 2 (two) times daily.      . fenofibrate 160 MG tablet TAKE ONE TABLET BY MOUTH EVERY DAY      . furosemide (LASIX) 20 MG tablet TAKE ONE-HALF TABLET BY MOUTH MON.,WED. AND FRI.      Marland Kitchen glipiZIDE (GLUCOTROL XL) 10 MG 24 hr tablet Take 10 mg by mouth 2 (two) times daily.       Marland Kitchen ipratropium-albuterol (DUONEB) 0.5-2.5 (3) MG/3ML SOLN Take 3 mLs by nebulization 2 (two) times daily.      Marland Kitchen LORazepam (ATIVAN) 0.5 MG tablet Take 1 tablet by mouth as needed.      Marland Kitchen losartan (COZAAR) 100 MG tablet TAKE ONE TABLET BY MOUTH EVERY DAY      . metFORMIN (GLUCOPHAGE) 1000 MG tablet TAKE ONE TABLET BY MOUTH TWICE DAILY      . metoprolol succinate (TOPROL-XL) 100  MG 24 hr tablet Take 100 mg by mouth daily.       . metoprolol succinate (TOPROL-XL) 100 MG 24 hr tablet TAKE ONE TABLET BY MOUTH EVERY DAY  90 tablet  0  . omeprazole (PRILOSEC) 40 MG capsule Take 40 mg by mouth daily.      Marland Kitchen oxybutynin (DITROPAN-XL) 10 MG 24 hr tablet Take 1 tablet by mouth daily.      . pantoprazole (PROTONIX) 40 MG tablet Take 40 mg by mouth daily.       . Probiotic Product (ALIGN PO) 1 capsule. Take 1 capsule by mouth daily.      Marland Kitchen spironolactone (ALDACTONE) 25 MG tablet Take 0.5 tablets (12.5 mg total) by mouth daily.  90 tablet  3   No current facility-administered medications for this visit.    Allergies  Allergen Reactions  . Bee Venom   . Latex Swelling    Some types    Past Medical History  Diagnosis Date  . Pacemaker     Implanted  by Dr [name] (MDT) 10/06/10  . Mobitz (type) II atrioventricular block   . HTN (hypertension)   . Diabetes in pregnancy   .  Obesity   . Hyperlipidemia   . DJD (degenerative joint disease)   . Asthma   . COPD (chronic obstructive pulmonary disease)   . Pancreatitis     pt thought this had been ruled out  . Sleep apnea   . Depression     Past Surgical History  Procedure Laterality Date  . Insert / replace / remove pacemaker  10/06/10    MDT  implanted by Dr [name]  . Total abdominal hysterectomy  1971  . Cystocele repair    . Ovary surgery      removal  . Repair rectocele    . Knee arthroscopy      both  . Pancreatitis    . Cataract extraction w/phaco  11/02/2011    Procedure: CATARACT EXTRACTION PHACO AND INTRAOCULAR LENS PLACEMENT (IOC);  Surgeon: Williams Che;  Location: [facility];  Service: Ophthalmology;  Laterality: Right;  CDE=7.33  . Eye surgery  11/01/2012    right eye  . Cataract extraction w/phaco  12/07/2011    Procedure: CATARACT EXTRACTION PHACO AND INTRAOCULAR LENS PLACEMENT (IOC);  Surgeon: Williams Che, MD;  Location: [facility];  Service: Ophthalmology;  Laterality: Left;  CDE 3.61    History  Smoking status  . Former Smoker -- 0.10 packs/day for 50 years  . Types: Cigarettes  . Quit date: 09/22/2013  Smokeless tobacco  . Former Systems developer  . Quit date: 11/17/2011    Comment: still smokes occassionally. (pt states that she has been quit 2 weeks)    History  Alcohol Use  . 0.6 oz/week  . 1 Glasses of wine per week    Comment: couple glasses of wine occasionally-once a month    Family History  Problem Relation Age of Onset  . Diabetes Brother   . Heart disease Mother     CHF  . Heart disease Father     CHF  . Arthritis Sister     osteo  . Cancer Brother     prostate  . Anesthesia problems Neg Hx   . Hypotension Neg Hx   . Malignant hyperthermia Neg Hx   . Pseudochol deficiency Neg Hx     Review of Systems: The review of systems is per the HPI.  All other systems were reviewed and are negative.  Physical Exam: BP 148/64  Pulse 62  Ht 5\' 6"  (1.676 m)  Wt 263  lb 12.8 oz (119.659 kg)  BMI 42.60 kg/m2  SpO2 94% BP by me is 110/70. Her cuff reading 101/75.  Patient is very pleasant and in no acute distress. Weight is down 9 pounds. She remains obese. Skin is warm and dry. Color is normal.  HEENT is unremarkable. Normocephalic/atraumatic. PERRL. Sclera are nonicteric. Neck is supple. No masses. No JVD. Lungs are clear. Cardiac exam shows a regular rate and rhythm. Abdomen is soft. Extremities are with trace edema. Gait and ROM are intact. No gross neurologic deficits noted.  Wt Readings from Last 3 Encounters:  12/11/13 263 lb 12.8 oz (119.659 kg)  11/07/13 274 lb (124.286 kg)  10/27/13 270 lb 12.8 oz (122.834 kg)     LABORATORY DATA: BMET pending  Lab Results  Component Value Date   WBC 8.5 10/27/2011   HGB 13.0 10/27/2011   HCT 39.8 10/27/2011   PLT 272 10/27/2011   GLUCOSE 152* 10/27/2013   ALT 24 05/04/2010   AST 27 05/04/2010   NA 137 10/27/2013   K 4.1 10/27/2013   CL 100 10/27/2013   CREATININE 0.9 10/27/2013   BUN 15 10/27/2013   CO2 29 10/27/2013   Myoview Impression from October 2013  Exercise Capacity: Lexiscan with no exercise.  BP Response: Normal blood pressure response.  Clinical Symptoms: Mild chest pain/dyspnea.  ECG Impression: T wave normalization but no significant ST changes to suggest ischemia.  Comparison with Prior Nuclear Study: No images to compare  Overall Impression: Normal stress nuclear study.  LV Ejection Fraction: 67%. LV Wall Motion: NL LV Function; NL Wall Motion  Brandi Hunter  Echo Study Conclusions from October 2013  - Left ventricle: The cavity size was normal. Wall thickness was increased in a pattern of mild LVH. Systolic function was normal. The estimated ejection fraction was in the range of 55% to 60%. - Mitral valve: Mild regurgitation. - Left atrium: The atrium was mildly dilated. - Atrial septum: No defect or patent foramen ovale was identified.  Assessment / Plan:  1. HTN - on CCB  and ARB along with low dose aldactone at 12.5 mg a day. Check BMET today. Her readings have improved. Her cuff correlates fairly. I have left her on her current regimen. See her back at her regular visit in October. She is to continue to monitor her readings at home - call for consistent readings above 160. Recheck BMET today  2. DM   3. Underlying PPM - followed by Dr. [name]   4. Tobacco abuse - not smoking   5. HLD   6. Obesity - down 9 pounds - encouraged her to continue her efforts.   Patient is agreeable to this plan and will call if any problems develop in the interim.   Brandi Junes, RN, [place]  [address] [place]  [place], [place] [address]

## 2013-12-11 NOTE — Patient Instructions (Addendum)
Stay on your current medicines  We need to check labs today  Continue to monitor your blood pressure - call us if your readings are consistently above 569 systolic  Keep up the good work with your weight!!!  See Dr. [name] back in October - sooner if needed  Call the [place] office at [phone] if you have any questions, problems or concerns.

## 2013-12-18 ENCOUNTER — Ambulatory Visit (INDEPENDENT_AMBULATORY_CARE_PROVIDER_SITE_OTHER): Payer: Medicare Other | Admitting: *Deleted

## 2013-12-18 DIAGNOSIS — I441 Atrioventricular block, second degree: Secondary | ICD-10-CM

## 2013-12-21 ENCOUNTER — Other Ambulatory Visit: Payer: Self-pay | Admitting: Internal Medicine

## 2013-12-27 LAB — MDC_IDC_ENUM_SESS_TYPE_REMOTE
Brady Statistic AP VP Percent: 40.8 %
Brady Statistic AS VP Percent: 33.8 %
Brady Statistic AS VS Percent: 19.8 %
Lead Channel Pacing Threshold Pulse Width: 0.4 ms
Lead Channel Sensing Intrinsic Amplitude: 22.4 mV
Lead Channel Setting Pacing Amplitude: 2 V
Lead Channel Setting Sensing Sensitivity: 5.6 mV
MDC IDC MSMT LEADCHNL RA SENSING INTR AMPL: 2.8 mV
MDC IDC MSMT LEADCHNL RV PACING THRESHOLD AMPLITUDE: 0.625 V
MDC IDC SET LEADCHNL RV PACING AMPLITUDE: 2.5 V
MDC IDC SET LEADCHNL RV PACING PULSEWIDTH: 0.4 ms
MDC IDC STAT BRADY AP VS PERCENT: 5.6 %

## 2014-01-03 ENCOUNTER — Encounter: Payer: Self-pay | Admitting: *Deleted

## 2014-01-05 ENCOUNTER — Encounter: Payer: Self-pay | Admitting: Internal Medicine

## 2014-03-06 DIAGNOSIS — E119 Type 2 diabetes mellitus without complications: Secondary | ICD-10-CM | POA: Insufficient documentation

## 2014-03-06 DIAGNOSIS — R7989 Other specified abnormal findings of blood chemistry: Secondary | ICD-10-CM | POA: Insufficient documentation

## 2014-03-08 ENCOUNTER — Other Ambulatory Visit: Payer: Self-pay | Admitting: Gastroenterology

## 2014-03-08 DIAGNOSIS — R1011 Right upper quadrant pain: Secondary | ICD-10-CM

## 2014-03-08 DIAGNOSIS — R110 Nausea: Secondary | ICD-10-CM

## 2014-03-20 ENCOUNTER — Ambulatory Visit (INDEPENDENT_AMBULATORY_CARE_PROVIDER_SITE_OTHER): Payer: Medicare Other | Admitting: *Deleted

## 2014-03-20 ENCOUNTER — Encounter: Payer: Self-pay | Admitting: Internal Medicine

## 2014-03-20 DIAGNOSIS — I498 Other specified cardiac arrhythmias: Secondary | ICD-10-CM

## 2014-03-20 DIAGNOSIS — I441 Atrioventricular block, second degree: Secondary | ICD-10-CM

## 2014-03-20 LAB — MDC_IDC_ENUM_SESS_TYPE_REMOTE
Battery Impedance: 205 Ohm
Battery Remaining Longevity: 116 mo
Battery Voltage: 2.78 V
Lead Channel Pacing Threshold Pulse Width: 0.4 ms
Lead Channel Sensing Intrinsic Amplitude: 2.8 mV
Lead Channel Setting Pacing Amplitude: 2 V
Lead Channel Setting Pacing Amplitude: 2.5 V
Lead Channel Setting Pacing Pulse Width: 0.4 ms
Lead Channel Setting Sensing Sensitivity: 4 mV
MDC IDC MSMT LEADCHNL RA IMPEDANCE VALUE: 441 Ohm
MDC IDC MSMT LEADCHNL RV IMPEDANCE VALUE: 527 Ohm
MDC IDC MSMT LEADCHNL RV PACING THRESHOLD AMPLITUDE: 0.625 V
MDC IDC MSMT LEADCHNL RV SENSING INTR AMPL: 11.2 mV
MDC IDC SESS DTM: 20150505120733
MDC IDC STAT BRADY AP VP PERCENT: 34 %
MDC IDC STAT BRADY AP VS PERCENT: 5 %
MDC IDC STAT BRADY AS VP PERCENT: 31 %
MDC IDC STAT BRADY AS VS PERCENT: 31 %

## 2014-03-26 ENCOUNTER — Ambulatory Visit (HOSPITAL_COMMUNITY)
Admission: RE | Admit: 2014-03-26 | Discharge: 2014-03-26 | Disposition: A | Discharge status: Home / Self Care | Payer: Medicare Other | Source: Ambulatory Visit | Attending: Gastroenterology | Admitting: Gastroenterology

## 2014-03-26 DIAGNOSIS — E119 Type 2 diabetes mellitus without complications: Secondary | ICD-10-CM | POA: Insufficient documentation

## 2014-03-26 DIAGNOSIS — R110 Nausea: Secondary | ICD-10-CM

## 2014-03-26 DIAGNOSIS — R1011 Right upper quadrant pain: Secondary | ICD-10-CM | POA: Insufficient documentation

## 2014-03-26 DIAGNOSIS — I10 Essential (primary) hypertension: Secondary | ICD-10-CM | POA: Insufficient documentation

## 2014-03-27 ENCOUNTER — Encounter (HOSPITAL_COMMUNITY)
Admission: RE | Admit: 2014-03-27 | Discharge: 2014-03-27 | Disposition: A | Discharge status: Home / Self Care | Payer: Medicare Other | Source: Ambulatory Visit | Attending: Diagnostic Radiology | Admitting: Diagnostic Radiology

## 2014-03-27 DIAGNOSIS — R110 Nausea: Secondary | ICD-10-CM | POA: Insufficient documentation

## 2014-03-27 DIAGNOSIS — R1011 Right upper quadrant pain: Secondary | ICD-10-CM | POA: Diagnosis not present

## 2014-03-27 MED ORDER — TECHNETIUM TC 99M MEBROFENIN IV KIT
5.0000 | PACK | Freq: Once | INTRAVENOUS | Status: AC | PRN
  Administered 2014-03-27: 5 via INTRAVENOUS

## 2014-03-27 MED ORDER — SINCALIDE 5 MCG IJ SOLR
0.0200 ug/kg | Freq: Once | INTRAMUSCULAR | Status: AC
  Administered 2014-03-27: 2.3 ug via INTRAVENOUS

## 2014-03-27 MED ORDER — STERILE WATER FOR INJECTION IJ SOLN
INTRAMUSCULAR | Status: AC
  Filled 2014-03-27: qty 10

## 2014-03-27 MED ORDER — SINCALIDE 5 MCG IJ SOLR
INTRAMUSCULAR | Status: AC
  Filled 2014-03-27: qty 5

## 2014-03-29 DIAGNOSIS — K760 Fatty (change of) liver, not elsewhere classified: Secondary | ICD-10-CM | POA: Insufficient documentation

## 2014-03-29 DIAGNOSIS — N281 Cyst of kidney, acquired: Secondary | ICD-10-CM | POA: Insufficient documentation

## 2014-03-30 NOTE — Progress Notes (Signed)
Remote pacemaker transmission.   

## 2014-04-10 ENCOUNTER — Ambulatory Visit (INDEPENDENT_AMBULATORY_CARE_PROVIDER_SITE_OTHER): Payer: Medicare Other | Admitting: Cardiology

## 2014-04-10 ENCOUNTER — Encounter: Payer: Self-pay | Admitting: Cardiology

## 2014-04-10 VITALS — BP 124/70 | HR 81 | Wt 260.0 lb

## 2014-04-10 DIAGNOSIS — I4891 Unspecified atrial fibrillation: Secondary | ICD-10-CM

## 2014-04-10 DIAGNOSIS — Z950 Presence of cardiac pacemaker: Secondary | ICD-10-CM

## 2014-04-10 DIAGNOSIS — I441 Atrioventricular block, second degree: Secondary | ICD-10-CM

## 2014-04-10 DIAGNOSIS — Z7189 Other specified counseling: Secondary | ICD-10-CM

## 2014-04-10 NOTE — Progress Notes (Signed)
ELECTROPHYSIOLOGY OFFICE NOTE   Patient ID: Brandi Hunter MRN: [id number], DOB/AGE: Oct 26, 1941   Date of Visit: 04/10/2014  Primary Physician: [name], [place] Primary Cardiologist: Thompson Grayer, MD (previously [name], MD) Reason for Visit: EP/device follow-up; new AFib found on recent remote PPM interrogation  History of Present Illness  Brandi Hunter is a 73 y.o. female with Mobitz II AV block s/p PPM implant, HTN, DM, OSA and asthma who presents today for evaluation after recently being found to have paroxysmal atrial fibrillation on recent remote PPM interrogation. She reports she is feeling "fine" from a cardiac standpoint. She denies CP, SOB, palpitations, dizziness, near syncope or syncope. She has had abdominal bloating and pain after eating for the past 4-6 weeks. She suspects her gallbladder needs to be removed. She is currently undergoing GI workup, has had abdominal US and HIDA scan. She is scheduled for colonoscopy next month. She has otherwise been in her usual state of health.  Past Medical History Past Medical History  Diagnosis Date  . Pacemaker     Implanted  by Dr [name] (MDT) 10/06/10  . Mobitz (type) II atrioventricular block   . HTN (hypertension)   . Diabetes in pregnancy   . Obesity   . Hyperlipidemia   . DJD (degenerative joint disease)   . Asthma   . COPD (chronic obstructive pulmonary disease)   . Pancreatitis     pt thought this had been ruled out  . Sleep apnea   . Depression     Past Surgical History Past Surgical History  Procedure Laterality Date  . Insert / replace / remove pacemaker  10/06/10    MDT  implanted by Dr [name]  . Total abdominal hysterectomy  1971  . Cystocele repair    . Ovary surgery      removal  . Repair rectocele    . Knee arthroscopy      both  . Pancreatitis    . Cataract extraction w/phaco  11/02/2011    Procedure: CATARACT EXTRACTION PHACO AND INTRAOCULAR LENS PLACEMENT (IOC);  Surgeon: Williams Che;   Location: [facility];  Service: Ophthalmology;  Laterality: Right;  CDE=7.33  . Eye surgery  11/01/2012    right eye  . Cataract extraction w/phaco  12/07/2011    Procedure: CATARACT EXTRACTION PHACO AND INTRAOCULAR LENS PLACEMENT (IOC);  Surgeon: Williams Che, MD;  Location: [facility];  Service: Ophthalmology;  Laterality: Left;  CDE 3.61    Allergies/Intolerances Allergies  Allergen Reactions  . Bee Venom   . Latex Swelling    Some types    Current Home Medications Current Outpatient Prescriptions  Medication Sig Dispense Refill  . albuterol (PROAIR HFA) 108 (90 BASE) MCG/ACT inhaler 2 puffs. Inhale 2 puffs into the lungs every 4 (four) hours as needed.      Marland Kitchen amLODipine (NORVASC) 10 MG tablet Take 1 tablet (10 mg total) by mouth daily.  90 tablet  3  . atorvastatin (LIPITOR) 40 MG tablet Take 40 mg by mouth daily.       . budesonide-formoterol (SYMBICORT) 160-4.5 MCG/ACT inhaler INHALE TWO PUFFS BY MOUTH TWICE DAILY. RINSE MOUTH AFTER USE.      . Calcium Carbonate-Vitamin D (RA CALCIUM PLUS VITAMIN D) 600-400 MG-UNIT per tablet Take 2 tablets by mouth daily.        . Cholecalciferol (VITAMIN D) 2000 UNITS CAPS Take 1 capsule by mouth daily.        . citalopram (CELEXA) 40 MG tablet Take 40  mg by mouth daily.      . Cyanocobalamin (VITAMIN B-12) 2000 MCG TBCR 1 tablet. Take 1 tablet by mouth 2 (two) times daily.      . fenofibrate 160 MG tablet TAKE ONE TABLET BY MOUTH EVERY DAY      . furosemide (LASIX) 20 MG tablet TAKE ONE-HALF TABLET BY MOUTH MON.,WED. AND FRI.      Marland Kitchen glipiZIDE (GLUCOTROL XL) 10 MG 24 hr tablet Take 10 mg by mouth 2 (two) times daily.       Marland Kitchen ipratropium-albuterol (DUONEB) 0.5-2.5 (3) MG/3ML SOLN Take 3 mLs by nebulization 2 (two) times daily.      Marland Kitchen LORazepam (ATIVAN) 0.5 MG tablet Take 1 tablet by mouth as needed.      Marland Kitchen losartan (COZAAR) 100 MG tablet TAKE ONE TABLET BY MOUTH EVERY DAY      . metFORMIN (GLUCOPHAGE) 1000 MG tablet TAKE ONE TABLET BY MOUTH TWICE  DAILY      . metoprolol succinate (TOPROL-XL) 100 MG 24 hr tablet Take 100 mg by mouth daily.       Marland Kitchen omeprazole (PRILOSEC) 40 MG capsule Take 40 mg by mouth daily.      Marland Kitchen oxybutynin (DITROPAN-XL) 10 MG 24 hr tablet Take 1 tablet by mouth daily.      . pantoprazole (PROTONIX) 40 MG tablet Take 40 mg by mouth daily.       . Probiotic Product (ALIGN PO) 1 capsule. Take 1 capsule by mouth daily.      Marland Kitchen spironolactone (ALDACTONE) 25 MG tablet Take 0.5 tablets (12.5 mg total) by mouth daily.  90 tablet  3   No current facility-administered medications for this visit.    Social History History   Social History  . Marital Status: Widowed    Spouse Name: N/A    Number of Children: 3  . Years of Education: College   Occupational History  . Part time receptionist    Social History Main Topics  . Smoking status: Former Smoker -- 0.10 packs/day for 50 years    Types: Cigarettes    Quit date: 09/22/2013  . Smokeless tobacco: Former Systems developer    Quit date: 11/17/2011     Comment: still smokes occassionally. (pt states that she has been quit 2 weeks)  . Alcohol Use: 0.6 oz/week    1 Glasses of wine per week     Comment: couple glasses of wine occasionally-once a month  . Drug Use: No  . Sexual Activity: Not on file   Other Topics Concern  . Not on file   Social History Narrative   Patient lives at home alone.   Caffeine Use: 16oz drink daily     Review of Systems General: No chills, fever, night sweats or weight changes Cardiovascular: No chest pain, dyspnea on exertion, edema, orthopnea, palpitations, paroxysmal nocturnal dyspnea Dermatological: No rash, lesions or masses Respiratory: No cough, dyspnea Urologic: No hematuria, dysuria Abdominal: No nausea, vomiting, diarrhea, bright red blood per rectum, melena, or hematemesis Neurologic: No visual changes, weakness, changes in mental status All other systems reviewed and are otherwise negative except as noted above.  Physical  Exam Vitals: Blood pressure 124/70, pulse 81, weight 260 lb (117.935 kg).  General: Well developed, well appearing 73 y.o. female in no acute distress. HEENT: Normocephalic, atraumatic. EOMs intact. Sclera nonicteric. Oropharynx clear.  Neck: Supple. No JVD. Lungs: Respirations regular and unlabored, CTA bilaterally. No wheezes, rales or rhonchi. Heart: RRR. S1, S2 present. No murmurs, rub, S3  or S4. Abdomen: Soft, non-distended.  Extremities: No clubbing, cyanosis or edema. DP/PT/Radials 2+ and equal bilaterally. Psych: Normal affect. Neuro: Alert and oriented X 3. Moves all extremities spontaneously.    Diagnostics  Echocardiogram (most recent in EPIC from Oct 2013) Study Conclusions - Left ventricle: The cavity size was normal. Wall thickness was increased in a pattern of mild LVH. Systolic function was normal. The estimated ejection fraction was in the range of 55% to 60%. - Mitral valve: Mild regurgitation. - Left atrium: The atrium was mildly dilated. - Atrial septum: No defect or patent foramen ovale was identified.  Recent labs (obtained from [place], done 03/06/2014) CMET - sodium 144, potassium 4.2, BUN 14, Cr 0.82, ALT 20, AST 20, alk phos 42, albumin 4.2 CBC - WBC 6500, Hgb 14.0, Hct 43.5, Plts 308,000  12-lead ECG today - AV paced at 80 bpm  Device interrogation today - Normal device function. Thresholds, sensing, impedances consistent with previous measurements. Device programmed to maximize longevity. 10 high atrial rates recorded, longest 56 minutes, EGMs consistent with atrial fibrillation. No high ventricular rates noted. Device programmed at appropriate safety margins. Histogram distribution appropriate for patient activity level. Device programmed to optimize intrinsic conduction. Estimated longevity 10 years.    Assessment and Plan  1. Paroxysmal atrial fibrillation  - newly diagnosed on recent PPM interrogation; asymptomatic   - order labs (CBC, BMET, Mg, TSH - she states she had recent labs drawn for Dr. Collene Mares so will request records) and update echocardiogram  - chads2-vasc score is 4 so anticoagulation is indicated and she has no history of bleeding / bleeding disorder  - discussed need for anticoagulation and risks / benefits; she expressed verbal understanding and agrees; will defer initiation of NOAC until after GI work-up complete (has colonoscopy scheduled for 04/30/2014) then most likely start Xarelto or Eliquis for stroke prevention  - return to clinic for follow-up in 2 weeks after colonoscopy  2. Mobitz II AV block s/p PPM implant - normal device function  - no programming changes made - continue routine remote PPM follow-up every 3 months  Signed, Andrez Grime, PA-C 04/10/2014, 1:07 PM

## 2014-04-10 NOTE — Patient Instructions (Addendum)
Your Physician recommends you keep your upcoming appointment 05/01/14 at 8:30 am with Brandi Hunter  Your physician recommends that you continue on your current medications as directed. Please refer to the Current Medication list given to you today.  Your physician has requested that you have an echocardiogram. Echocardiography is a painless test that uses sound waves to create images of your heart. It provides your doctor with information about the size and shape of your heart and how well your heart's chambers and valves are working. This procedure takes approximately one hour. There are no restrictions for this procedure.

## 2014-04-12 ENCOUNTER — Encounter: Payer: Self-pay | Admitting: Cardiology

## 2014-04-12 ENCOUNTER — Encounter (HOSPITAL_COMMUNITY): Payer: Self-pay | Admitting: Pharmacy Technician

## 2014-04-12 LAB — MDC_IDC_ENUM_SESS_TYPE_INCLINIC
Battery Impedance: 181 Ohm
Battery Remaining Longevity: 119 mo
Battery Voltage: 2.78 V
Brady Statistic AP VS Percent: 4 %
Lead Channel Impedance Value: 441 Ohm
Lead Channel Pacing Threshold Amplitude: 0.75 V
Lead Channel Pacing Threshold Pulse Width: 0.4 ms
Lead Channel Pacing Threshold Pulse Width: 0.4 ms
Lead Channel Sensing Intrinsic Amplitude: 15.68 mV
Lead Channel Sensing Intrinsic Amplitude: 5.6 mV
Lead Channel Setting Pacing Amplitude: 2 V
Lead Channel Setting Pacing Amplitude: 2.5 V
Lead Channel Setting Pacing Pulse Width: 0.4 ms
MDC IDC MSMT LEADCHNL RA PACING THRESHOLD AMPLITUDE: 1 V
MDC IDC MSMT LEADCHNL RV IMPEDANCE VALUE: 527 Ohm
MDC IDC SESS DTM: 20150526162008
MDC IDC SET LEADCHNL RV SENSING SENSITIVITY: 4 mV
MDC IDC STAT BRADY AP VP PERCENT: 35 %
MDC IDC STAT BRADY AS VP PERCENT: 31 %
MDC IDC STAT BRADY AS VS PERCENT: 29 %

## 2014-04-17 ENCOUNTER — Encounter (HOSPITAL_COMMUNITY): Payer: Self-pay | Admitting: *Deleted

## 2014-04-24 ENCOUNTER — Other Ambulatory Visit: Payer: Self-pay | Admitting: Internal Medicine

## 2014-04-30 ENCOUNTER — Ambulatory Visit (HOSPITAL_COMMUNITY): Admission: RE | Admit: 2014-04-30 | Payer: Medicare Other | Source: Ambulatory Visit | Admitting: Gastroenterology

## 2014-04-30 HISTORY — DX: Dependence on supplemental oxygen: Z99.81

## 2014-04-30 HISTORY — DX: Gastro-esophageal reflux disease without esophagitis: K21.9

## 2014-04-30 SURGERY — COLONOSCOPY WITH PROPOFOL
Anesthesia: Moderate Sedation

## 2014-05-01 ENCOUNTER — Ambulatory Visit (HOSPITAL_COMMUNITY): Payer: Medicare Other | Attending: Cardiology | Admitting: Radiology

## 2014-05-01 ENCOUNTER — Ambulatory Visit (INDEPENDENT_AMBULATORY_CARE_PROVIDER_SITE_OTHER): Payer: Medicare Other | Admitting: Cardiology

## 2014-05-01 VITALS — BP 126/64 | HR 80 | Resp 18 | Wt 250.8 lb

## 2014-05-01 DIAGNOSIS — Z87891 Personal history of nicotine dependence: Secondary | ICD-10-CM | POA: Insufficient documentation

## 2014-05-01 DIAGNOSIS — I059 Rheumatic mitral valve disease, unspecified: Secondary | ICD-10-CM | POA: Insufficient documentation

## 2014-05-01 DIAGNOSIS — I10 Essential (primary) hypertension: Secondary | ICD-10-CM | POA: Insufficient documentation

## 2014-05-01 DIAGNOSIS — I079 Rheumatic tricuspid valve disease, unspecified: Secondary | ICD-10-CM | POA: Insufficient documentation

## 2014-05-01 DIAGNOSIS — I441 Atrioventricular block, second degree: Secondary | ICD-10-CM | POA: Insufficient documentation

## 2014-05-01 DIAGNOSIS — I4891 Unspecified atrial fibrillation: Secondary | ICD-10-CM

## 2014-05-01 DIAGNOSIS — J4489 Other specified chronic obstructive pulmonary disease: Secondary | ICD-10-CM | POA: Insufficient documentation

## 2014-05-01 DIAGNOSIS — Z950 Presence of cardiac pacemaker: Secondary | ICD-10-CM | POA: Insufficient documentation

## 2014-05-01 DIAGNOSIS — E119 Type 2 diabetes mellitus without complications: Secondary | ICD-10-CM | POA: Insufficient documentation

## 2014-05-01 DIAGNOSIS — I480 Paroxysmal atrial fibrillation: Secondary | ICD-10-CM

## 2014-05-01 DIAGNOSIS — J449 Chronic obstructive pulmonary disease, unspecified: Secondary | ICD-10-CM | POA: Insufficient documentation

## 2014-05-01 NOTE — Progress Notes (Signed)
Echocardiogram performed.  

## 2014-05-02 NOTE — Progress Notes (Signed)
ELECTROPHYSIOLOGY OFFICE NOTE   Patient ID: Brandi Hunter MRN: [id number], DOB/AGE: 1941/01/26   Date of Visit: 05/01/2014  Primary Physician: [name], [place] Primary Cardiologist: Thompson Grayer, MD (previously [name], MD)  Primary Gastroenterologist: Juanita Craver, MD Reason for Visit: EP/device follow-up; new AFib found on recent remote PPM interrogation   History of Present Illness  Brandi Hunter is a 73 y.o. 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 She reports she is feeling "fine" from a cardiac standpoint. She denies CP, SOB, palpitations, dizziness, near syncope or syncope. She has had abdominal bloating and pain after eating for the past 4-6 weeks. She suspects her gallbladder needs to be removed. She is currently undergoing GI workup, has had abdominal US and HIDA scan. She was scheduled for colonoscopy last week but she developed viral URI so this was rescheduled to September 2015. She has otherwise been in her usual state of health. We were waiting for her GI work-up to be completed before starting anticoagulation.   Past Medical History Past Medical History  Diagnosis Date  . Pacemaker     Implanted  by Dr [name] (MDT) 10/06/10  . Mobitz (type) II atrioventricular block   . HTN (hypertension)   . Obesity   . Hyperlipidemia   . DJD (degenerative joint disease)   . Asthma   . COPD (chronic obstructive pulmonary disease)   . Pancreatitis 2010 OR 2011    pt thought this had been ruled out  . Sleep apnea     CPA SETTING OF 14  . Diabetes mellitus without complication     TYPE 2  . GERD (gastroesophageal reflux disease)   . History of home oxygen therapy     oxygen 2 liters per nasal cannula at hs    Past Surgical History Past Surgical History  Procedure Laterality Date  . Insert / replace / remove  pacemaker  10/06/10    MDT  implanted by Dr [name]  . Total abdominal hysterectomy  1971  . Cystocele repair    . Ovary surgery      removal  . Repair rectocele    . Knee arthroscopy      both  . Cataract extraction w/phaco  11/02/2011    Procedure: CATARACT EXTRACTION PHACO AND INTRAOCULAR LENS PLACEMENT (IOC);  Surgeon: Williams Che;  Location: [facility];  Service: Ophthalmology;  Laterality: Right;  CDE=7.33  . Cataract extraction w/phaco  12/07/2011    Procedure: CATARACT EXTRACTION PHACO AND INTRAOCULAR LENS PLACEMENT (IOC);  Surgeon: Williams Che, MD;  Location: [facility];  Service: Ophthalmology;  Laterality: Left;  CDE 3.61  . Eye surgery  11/01/2012    BOTH EYES CATARACTS  . Tonsillectomy  AGE 79    Allergies/Intolerances Allergies  Allergen Reactions  . Bee Venom Swelling  . Latex Swelling    LATEX CATHETERS    Current Home Medications Current Outpatient Prescriptions  Medication Sig Dispense Refill  . albuterol (PROAIR HFA) 108 (90 BASE) MCG/ACT inhaler Inhale 2 puffs into the lungs every 4 (four) hours as needed for wheezing. Inhale 2 puffs into the lungs every 4 (four) hours as needed.      Marland Kitchen amLODipine (NORVASC) 10 MG tablet Take 10 mg by mouth at bedtime.      Marland Kitchen atorvastatin (LIPITOR) 40 MG tablet Take 40 mg by mouth daily.       Marland Kitchen  budesonide-formoterol (SYMBICORT) 160-4.5 MCG/ACT inhaler Inhale 2 puffs into the lungs 2 (two) times daily.      . Calcium Carbonate-Vitamin D (RA CALCIUM PLUS VITAMIN D) 600-400 MG-UNIT per tablet Take 2 tablets by mouth daily.        . citalopram (CELEXA) 40 MG tablet Take 40 mg by mouth at bedtime.       . Cyanocobalamin (VITAMIN B-12) 2000 MCG TBCR 1 tablet. Take 1 tablet by mouth 2 (two) times daily.      . fenofibrate 160 MG tablet Take 160 mg by mouth every evening.      . furosemide (LASIX) 20 MG tablet Take 10 mg by mouth every Monday, Wednesday, and Friday.      Marland Kitchen glipiZIDE (GLUCOTROL XL) 10 MG 24 hr tablet Take 10 mg by  mouth 2 (two) times daily.       Marland Kitchen ipratropium-albuterol (DUONEB) 0.5-2.5 (3) MG/3ML SOLN Take 3 mLs by nebulization 2 (two) times daily.      Marland Kitchen LORazepam (ATIVAN) 0.5 MG tablet Take 1 tablet by mouth every 6 (six) hours as needed for anxiety.       Marland Kitchen losartan (COZAAR) 100 MG tablet Take 100 mg by mouth every evening.      . metFORMIN (GLUCOPHAGE) 1000 MG tablet Take 1,000 mg by mouth 2 (two) times daily with a meal.      . metoprolol succinate (TOPROL-XL) 100 MG 24 hr tablet TAKE ONE TABLET BY MOUTH ONCE DAILY  90 tablet  0  . omeprazole (PRILOSEC) 40 MG capsule Take 40 mg by mouth daily.      Marland Kitchen oxybutynin (DITROPAN-XL) 10 MG 24 hr tablet Take 1 tablet by mouth daily.      . pantoprazole (PROTONIX) 40 MG tablet Take 40 mg by mouth daily.       . Probiotic Product (ALIGN PO) Take 1 capsule by mouth daily.       Marland Kitchen spironolactone (ALDACTONE) 25 MG tablet Take 0.5 tablets (12.5 mg total) by mouth daily.  90 tablet  3   No current facility-administered medications for this visit.    Social History History   Social History  . Marital Status: Widowed    Spouse Name: N/A    Number of Children: 3  . Years of Education: College   Occupational History  . Part time receptionist    Social History Main Topics  . Smoking status: Former Smoker -- 0.10 packs/day for 50 years    Types: Cigarettes    Quit date: 09/22/2013  . Smokeless tobacco: Former Systems developer    Quit date: 11/17/2011     Comment: still smokes occassionally. (pt states that she has been quit 2 weeks)  . Alcohol Use: 0.6 oz/week    1 Glasses of wine per week     Comment: couple glasses of wine occasionally-once a month  . Drug Use: No  . Sexual Activity: Not on file   Other Topics Concern  . Not on file   Social History Narrative   Patient lives at home alone.   Caffeine Use: 16oz drink daily     Review of Systems General: No chills, fever, night sweats or weight changes Cardiovascular: No chest pain, dyspnea on exertion,  edema, orthopnea, palpitations, paroxysmal nocturnal dyspnea Dermatological: No rash, lesions or masses Respiratory: No cough, dyspnea Urologic: No hematuria, dysuria Abdominal: No nausea, vomiting, diarrhea, bright red blood per rectum, melena, or hematemesis Neurologic: No visual changes, weakness, changes in mental status All other systems reviewed  and are otherwise negative except as noted above.  Physical Exam Vitals: Blood pressure 126/64, pulse 80, resp. rate 18, weight 250 lb 12.8 oz (113.762 kg).  General: Well developed, well appearing 73 y.o. female in no acute distress. HEENT: Normocephalic, atraumatic. EOMs intact. Sclera nonicteric. Oropharynx clear.  Neck: Supple. No JVD. Lungs: Respirations regular and unlabored, CTA bilaterally. No wheezes, rales or rhonchi. Heart: RRR. S1, S2 present. No murmurs, rub, S3 or S4. Abdomen: Soft, non-distended.  Extremities: No clubbing, cyanosis or edema. PT/Radials 2+ and equal bilaterally. Psych: Normal affect. Neuro: Alert and oriented X 3. Moves all extremities spontaneously.   Diagnostics  2D echocardiogram today Study Conclusions - Left ventricle: The cavity size was normal. Systolic function was normal. The estimated ejection fraction was in the range of 60% to 65%. Wall motion was normal; there were no regional wall motion abnormalities. Doppler parameters are consistent with abnormal left ventricular relaxation (grade 1 diastolic dysfunction). - Aortic valve: Trileaflet; normal thickness leaflets. There was no regurgitation. - Aortic root: The aortic root was normal in size. - Mitral valve: There was mild regurgitation. - Left atrium: The atrium was mildly dilated. 42 mm. - Right ventricle: Systolic function was normal. - Right atrium: The atrium was normal in size. - Pulmonic valve: There was no regurgitation. - Pulmonary arteries: Systolic pressure was within the normal range. - Pericardium, extracardiac: There was no  pericardial effusion. Impression: - Normal biventricular size and function. Impaired relaxation. Mild mitral regurgitation.  Device interrogation today - Quick look for AFib episodes/burden since 04/10/2014. No atrial high rate episodes. No ventricular high rate episodes. Battery longevity 9.5 years.  Assessment and Plan  1. Paroxysmal atrial fibrillation  - newly diagnosed on recent PPM interrogation; asymptomatic  - recent labs reviewed (see my last office note 04/10/2014) - echo shows normal LVEF  - chads2-vasc score is 4 so anticoagulation is indicated and she has no history of bleeding / bleeding disorder  - discussed need for anticoagulation and risks / benefits; she expressed verbal understanding and agrees; will defer initiation of NOAC until after GI work-up complete; had colonoscopy scheduled for 04/30/2014 but this was rescheduled for September; I called and spoke with Dr. Collene Mares via phone today who expressed concern regarding initiating anticoagulation at this time; Dr. Collene Mares recommended no anticoagulation until her colonoscopy is done; Dr. Lorie Apley office is going to arrange for an earlier date for colonoscopy - once cleared by GI, will most likely start Xarelto or Eliquis for stroke prevention  - return to clinic for follow-up in 1-2 weeks after colonoscopy   2. 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

## 2014-05-03 ENCOUNTER — Other Ambulatory Visit: Payer: Self-pay | Admitting: Gastroenterology

## 2014-05-03 ENCOUNTER — Encounter: Payer: Self-pay | Admitting: Internal Medicine

## 2014-05-03 DIAGNOSIS — R935 Abnormal findings on diagnostic imaging of other abdominal regions, including retroperitoneum: Secondary | ICD-10-CM

## 2014-05-03 DIAGNOSIS — R110 Nausea: Secondary | ICD-10-CM

## 2014-05-04 ENCOUNTER — Other Ambulatory Visit: Payer: Self-pay | Admitting: Gastroenterology

## 2014-05-04 ENCOUNTER — Encounter: Payer: Self-pay | Admitting: Cardiology

## 2014-05-04 DIAGNOSIS — R110 Nausea: Secondary | ICD-10-CM

## 2014-05-04 DIAGNOSIS — R935 Abnormal findings on diagnostic imaging of other abdominal regions, including retroperitoneum: Secondary | ICD-10-CM

## 2014-05-09 ENCOUNTER — Encounter: Payer: Self-pay | Admitting: Cardiology

## 2014-05-09 ENCOUNTER — Ambulatory Visit
Admission: RE | Admit: 2014-05-09 | Discharge: 2014-05-09 | Disposition: A | Discharge status: Home / Self Care | Payer: Medicare Other | Source: Ambulatory Visit | Attending: Gastroenterology | Admitting: Gastroenterology

## 2014-05-09 DIAGNOSIS — R935 Abnormal findings on diagnostic imaging of other abdominal regions, including retroperitoneum: Secondary | ICD-10-CM

## 2014-05-09 DIAGNOSIS — R110 Nausea: Secondary | ICD-10-CM

## 2014-05-09 MED ORDER — IOHEXOL 300 MG/ML  SOLN
125.0000 mL | Freq: Once | INTRAMUSCULAR | Status: AC | PRN
  Administered 2014-05-09: 125 mL via INTRAVENOUS

## 2014-05-17 ENCOUNTER — Other Ambulatory Visit: Payer: Self-pay | Admitting: Gastroenterology

## 2014-05-17 ENCOUNTER — Other Ambulatory Visit: Payer: Self-pay | Admitting: Family Medicine

## 2014-05-17 DIAGNOSIS — R110 Nausea: Secondary | ICD-10-CM

## 2014-05-17 DIAGNOSIS — R911 Solitary pulmonary nodule: Secondary | ICD-10-CM

## 2014-05-22 ENCOUNTER — Telehealth: Payer: Self-pay | Admitting: Internal Medicine

## 2014-05-22 DIAGNOSIS — I480 Paroxysmal atrial fibrillation: Secondary | ICD-10-CM

## 2014-05-22 NOTE — Telephone Encounter (Signed)
New message      FYI Pt says her gastro doctor says it it ok to start blood thinner.  Brandi Hunter wanted to start her on this medication

## 2014-05-22 NOTE — Telephone Encounter (Signed)
I spoke with pt & she will have Dr. Lorie Apley office at [facility] send our office a note stating pt is okay to start anticoagulation.  Pt states Jerene Pitch had told her it would be Eliquis.  Forwarded to [facility] & Dr. [name] as he will be in the office tomorrow Horton Chin RN

## 2014-05-23 ENCOUNTER — Encounter (INDEPENDENT_AMBULATORY_CARE_PROVIDER_SITE_OTHER): Payer: Self-pay

## 2014-05-23 ENCOUNTER — Ambulatory Visit
Admission: RE | Admit: 2014-05-23 | Discharge: 2014-05-23 | Disposition: A | Discharge status: Home / Self Care | Payer: Medicare Other | Source: Ambulatory Visit | Attending: Family Medicine | Admitting: Family Medicine

## 2014-05-23 DIAGNOSIS — R911 Solitary pulmonary nodule: Secondary | ICD-10-CM

## 2014-05-23 NOTE — Telephone Encounter (Signed)
Start eliquis 5mg  BID She should follow-up with the anticoagulation clinic in 4 weeks

## 2014-05-24 DIAGNOSIS — I480 Paroxysmal atrial fibrillation: Secondary | ICD-10-CM | POA: Insufficient documentation

## 2014-05-24 DIAGNOSIS — I4821 Permanent atrial fibrillation: Secondary | ICD-10-CM | POA: Insufficient documentation

## 2014-05-24 MED ORDER — APIXABAN 5 MG PO TABS: 5.0000 mg | ORAL_TABLET | Freq: Two times a day (BID) | ORAL | Status: DC

## 2014-05-24 NOTE — Telephone Encounter (Signed)
I spoke with pt & she will start Eliquis 5 mg bid as directed.  She is aware our office will call her with an anticoagulation appointment for 4 weeks from now [place]

## 2014-05-25 ENCOUNTER — Telehealth: Payer: Self-pay | Admitting: *Deleted

## 2014-05-25 NOTE — Telephone Encounter (Signed)
Spoke with [employer] and information needed for prior approval given. Approval received for Eliquis through May 26, 2015.

## 2014-05-30 ENCOUNTER — Encounter (HOSPITAL_COMMUNITY)
Admission: RE | Admit: 2014-05-30 | Discharge: 2014-05-30 | Disposition: A | Discharge status: Home / Self Care | Payer: Medicare Other | Source: Ambulatory Visit | Attending: Gastroenterology | Admitting: Gastroenterology

## 2014-05-30 DIAGNOSIS — R110 Nausea: Secondary | ICD-10-CM | POA: Insufficient documentation

## 2014-05-30 MED ORDER — TECHNETIUM TC 99M SULFUR COLLOID
2.0000 | Freq: Once | INTRAVENOUS | Status: AC | PRN
  Administered 2014-05-30: 2 via INTRAVENOUS

## 2014-06-21 ENCOUNTER — Ambulatory Visit: Payer: Medicare Other | Admitting: *Deleted

## 2014-06-21 ENCOUNTER — Telehealth: Payer: Self-pay | Admitting: Cardiology

## 2014-06-21 NOTE — Telephone Encounter (Signed)
LMOVM reminding pt to send remote transmission.   

## 2014-06-22 ENCOUNTER — Encounter: Payer: Self-pay | Admitting: Cardiology

## 2014-06-22 ENCOUNTER — Ambulatory Visit (INDEPENDENT_AMBULATORY_CARE_PROVIDER_SITE_OTHER): Payer: Medicare Other | Admitting: *Deleted

## 2014-06-22 ENCOUNTER — Encounter: Payer: Self-pay | Admitting: Internal Medicine

## 2014-06-22 DIAGNOSIS — I4891 Unspecified atrial fibrillation: Secondary | ICD-10-CM

## 2014-06-25 NOTE — Progress Notes (Signed)
Remote pacemaker transmission.   

## 2014-07-02 ENCOUNTER — Ambulatory Visit (INDEPENDENT_AMBULATORY_CARE_PROVIDER_SITE_OTHER): Payer: Medicare Other

## 2014-07-02 DIAGNOSIS — Z5181 Encounter for therapeutic drug level monitoring: Secondary | ICD-10-CM

## 2014-07-02 DIAGNOSIS — I4891 Unspecified atrial fibrillation: Secondary | ICD-10-CM

## 2014-07-02 DIAGNOSIS — I480 Paroxysmal atrial fibrillation: Secondary | ICD-10-CM

## 2014-07-02 LAB — BASIC METABOLIC PANEL
BUN: 20 mg/dL (ref 6–23)
CO2: 29 mEq/L (ref 19–32)
Calcium: 9.3 mg/dL (ref 8.4–10.5)
Chloride: 105 mEq/L (ref 96–112)
Creatinine, Ser: 1.1 mg/dL (ref 0.4–1.2)
GFR: 54.84 mL/min — ABNORMAL LOW (ref >60.00)
Glucose, Bld: 188 mg/dL — ABNORMAL HIGH (ref 70–99)
POTASSIUM: 4.1 meq/L (ref 3.5–5.1)
SODIUM: 142 meq/L (ref 135–145)

## 2014-07-02 LAB — CBC
HCT: 42.2 % (ref 36.0–46.0)
Hemoglobin: 13.6 g/dL (ref 12.0–15.0)
MCHC: 32.3 g/dL (ref 30.0–36.0)
MCV: 88 fl (ref 78.0–100.0)
Platelets: 302 10*3/uL (ref 150.0–400.0)
RBC: 4.79 Mil/uL (ref 3.87–5.11)
RDW: 15 % (ref 11.5–15.5)
WBC: 6.5 10*3/uL (ref 4.0–10.5)

## 2014-07-02 LAB — POCT INR: INR: 1.1

## 2014-07-02 NOTE — Patient Instructions (Signed)

## 2014-07-02 NOTE — Progress Notes (Signed)
Patient ID: Brandi Hunter, female   DOB: 11/25/1940, 73 y.o.   MRN: [id number] Pt was started on Eliquis 5mg  BIDfor afib on 05/24/14 by Dr [name].   Reviewed patients medication list.  Pt is not currently on any combined P-gp and strong CYP3A4 inhibitors/inducers (ketoconazole, traconazole, ritonavir, carbamazepine, phenytoin, rifampin, St. John's wort).  Reviewed labs.  SCr 1.1, Weight 113 Kg.  Age 40yrs. .  Dose  appropriate based on specified criteria.   Hgb and HCT WNL. Follow up with MD as scheduled.

## 2014-07-06 ENCOUNTER — Telehealth: Payer: Self-pay | Admitting: *Deleted

## 2014-07-06 NOTE — Telephone Encounter (Signed)
ptcb and was notified about lab results by Chestine Spore RN, pt verbalized understanding.

## 2014-07-09 LAB — MDC_IDC_ENUM_SESS_TYPE_REMOTE
Battery Remaining Longevity: 113 mo
Battery Voltage: 2.78 V
Brady Statistic AP VP Percent: 43 %
Brady Statistic AS VS Percent: 21 %
Date Time Interrogation Session: 20150807224138
Lead Channel Impedance Value: 435 Ohm
Lead Channel Pacing Threshold Amplitude: 0.75 V
Lead Channel Pacing Threshold Pulse Width: 0.4 ms
Lead Channel Sensing Intrinsic Amplitude: 11.2 mV
Lead Channel Setting Pacing Amplitude: 2 V
Lead Channel Setting Pacing Amplitude: 2.5 V
Lead Channel Setting Sensing Sensitivity: 4 mV
MDC IDC MSMT BATTERY IMPEDANCE: 205 Ohm
MDC IDC MSMT LEADCHNL RA SENSING INTR AMPL: 2.8 mV
MDC IDC MSMT LEADCHNL RV IMPEDANCE VALUE: 521 Ohm
MDC IDC SET LEADCHNL RV PACING PULSEWIDTH: 0.4 ms
MDC IDC STAT BRADY AP VS PERCENT: 0 %
MDC IDC STAT BRADY AS VP PERCENT: 36 %

## 2014-07-17 ENCOUNTER — Encounter: Payer: Self-pay | Admitting: Cardiology

## 2014-08-02 ENCOUNTER — Encounter (HOSPITAL_COMMUNITY): Payer: Self-pay | Admitting: *Deleted

## 2014-08-08 ENCOUNTER — Other Ambulatory Visit: Payer: Self-pay | Admitting: Gastroenterology

## 2014-08-09 ENCOUNTER — Encounter (HOSPITAL_COMMUNITY)
Admission: RE | Disposition: A | Discharge status: Home / Self Care | Payer: Self-pay | Source: Ambulatory Visit | Attending: Gastroenterology

## 2014-08-09 ENCOUNTER — Ambulatory Visit (HOSPITAL_COMMUNITY): Payer: Medicare Other | Admitting: Anesthesiology

## 2014-08-09 ENCOUNTER — Encounter (HOSPITAL_COMMUNITY): Payer: Self-pay | Admitting: *Deleted

## 2014-08-09 ENCOUNTER — Ambulatory Visit (HOSPITAL_COMMUNITY)
Admission: RE | Admit: 2014-08-09 | Discharge: 2014-08-09 | Disposition: A | Discharge status: Home / Self Care | Payer: Medicare Other | Source: Ambulatory Visit | Attending: Gastroenterology | Admitting: Gastroenterology

## 2014-08-09 ENCOUNTER — Encounter (HOSPITAL_COMMUNITY): Payer: Medicare Other | Admitting: Anesthesiology

## 2014-08-09 DIAGNOSIS — I441 Atrioventricular block, second degree: Secondary | ICD-10-CM | POA: Diagnosis not present

## 2014-08-09 DIAGNOSIS — E119 Type 2 diabetes mellitus without complications: Secondary | ICD-10-CM | POA: Insufficient documentation

## 2014-08-09 DIAGNOSIS — D126 Benign neoplasm of colon, unspecified: Secondary | ICD-10-CM | POA: Diagnosis not present

## 2014-08-09 DIAGNOSIS — M1990 Unspecified osteoarthritis, unspecified site: Secondary | ICD-10-CM | POA: Diagnosis not present

## 2014-08-09 DIAGNOSIS — K648 Other hemorrhoids: Secondary | ICD-10-CM | POA: Insufficient documentation

## 2014-08-09 DIAGNOSIS — Z800 Family history of malignant neoplasm of digestive organs: Secondary | ICD-10-CM | POA: Insufficient documentation

## 2014-08-09 DIAGNOSIS — J4489 Other specified chronic obstructive pulmonary disease: Secondary | ICD-10-CM | POA: Insufficient documentation

## 2014-08-09 DIAGNOSIS — E669 Obesity, unspecified: Secondary | ICD-10-CM | POA: Insufficient documentation

## 2014-08-09 DIAGNOSIS — Z90710 Acquired absence of both cervix and uterus: Secondary | ICD-10-CM | POA: Insufficient documentation

## 2014-08-09 DIAGNOSIS — I10 Essential (primary) hypertension: Secondary | ICD-10-CM | POA: Diagnosis not present

## 2014-08-09 DIAGNOSIS — K449 Diaphragmatic hernia without obstruction or gangrene: Secondary | ICD-10-CM | POA: Diagnosis not present

## 2014-08-09 DIAGNOSIS — Z9849 Cataract extraction status, unspecified eye: Secondary | ICD-10-CM | POA: Insufficient documentation

## 2014-08-09 DIAGNOSIS — R110 Nausea: Secondary | ICD-10-CM | POA: Diagnosis not present

## 2014-08-09 DIAGNOSIS — J449 Chronic obstructive pulmonary disease, unspecified: Secondary | ICD-10-CM | POA: Insufficient documentation

## 2014-08-09 DIAGNOSIS — Z87891 Personal history of nicotine dependence: Secondary | ICD-10-CM | POA: Diagnosis not present

## 2014-08-09 DIAGNOSIS — E785 Hyperlipidemia, unspecified: Secondary | ICD-10-CM | POA: Diagnosis not present

## 2014-08-09 DIAGNOSIS — G4730 Sleep apnea, unspecified: Secondary | ICD-10-CM | POA: Insufficient documentation

## 2014-08-09 DIAGNOSIS — K5730 Diverticulosis of large intestine without perforation or abscess without bleeding: Secondary | ICD-10-CM | POA: Diagnosis not present

## 2014-08-09 DIAGNOSIS — Z950 Presence of cardiac pacemaker: Secondary | ICD-10-CM | POA: Diagnosis not present

## 2014-08-09 DIAGNOSIS — Z1211 Encounter for screening for malignant neoplasm of colon: Secondary | ICD-10-CM | POA: Insufficient documentation

## 2014-08-09 DIAGNOSIS — Z9981 Dependence on supplemental oxygen: Secondary | ICD-10-CM | POA: Diagnosis not present

## 2014-08-09 DIAGNOSIS — K219 Gastro-esophageal reflux disease without esophagitis: Secondary | ICD-10-CM | POA: Diagnosis not present

## 2014-08-09 DIAGNOSIS — Z79899 Other long term (current) drug therapy: Secondary | ICD-10-CM | POA: Diagnosis not present

## 2014-08-09 HISTORY — PX: ESOPHAGOGASTRODUODENOSCOPY (EGD) WITH PROPOFOL: SHX5813

## 2014-08-09 HISTORY — PX: COLONOSCOPY WITH PROPOFOL: SHX5780

## 2014-08-09 LAB — GLUCOSE, CAPILLARY: GLUCOSE-CAPILLARY: 96 mg/dL (ref 70–99)

## 2014-08-09 SURGERY — COLONOSCOPY WITH PROPOFOL
Anesthesia: Monitor Anesthesia Care

## 2014-08-09 MED ORDER — LACTATED RINGERS IV SOLN
INTRAVENOUS | Status: DC
  Administered 2014-08-09: 1000 mL via INTRAVENOUS

## 2014-08-09 MED ORDER — PROPOFOL 10 MG/ML IV BOLUS
INTRAVENOUS | Status: AC
  Filled 2014-08-09: qty 20

## 2014-08-09 MED ORDER — LIDOCAINE VISCOUS 2 % MT SOLN
OROMUCOSAL | Status: AC
  Filled 2014-08-09: qty 15

## 2014-08-09 MED ORDER — PROPOFOL 10 MG/ML IV BOLUS
INTRAVENOUS | Status: DC | PRN
  Administered 2014-08-09: 40 mg via INTRAVENOUS
  Administered 2014-08-09 (×2): 20 mg via INTRAVENOUS
  Administered 2014-08-09 (×2): 40 mg via INTRAVENOUS

## 2014-08-09 MED ORDER — PROPOFOL 10 MG/ML IV BOLUS
INTRAVENOUS | Status: AC
  Filled 2014-08-09: qty 40

## 2014-08-09 MED ORDER — SODIUM CHLORIDE 0.9 % IV SOLN
INTRAVENOUS | Status: DC
Start: 2014-08-09 — End: 2014-08-09

## 2014-08-09 MED ORDER — LACTATED RINGERS IV SOLN
INTRAVENOUS | Status: DC | PRN
  Administered 2014-08-09: 10:00:00 via INTRAVENOUS

## 2014-08-09 MED ORDER — LIDOCAINE VISCOUS 2 % MT SOLN
OROMUCOSAL | Status: DC | PRN
  Administered 2014-08-09: 10 mL via OROMUCOSAL

## 2014-08-09 SURGICAL SUPPLY — 25 items

## 2014-08-09 NOTE — Transfer of Care (Signed)
Immediate Anesthesia Transfer of Care Note  Patient: Brandi Hunter  Procedure(s) Performed: Procedure(s): COLONOSCOPY WITH PROPOFOL (N/A) ESOPHAGOGASTRODUODENOSCOPY (EGD) WITH PROPOFOL (N/A)  Patient Location: PACU and Endoscopy Unit  Anesthesia Type:MAC  Level of Consciousness: awake, alert , oriented and patient cooperative  Airway & Oxygen Therapy: Patient Spontanous Breathing and Patient connected to face mask oxygen  Post-op Assessment: Report given to PACU RN, Post -op Vital signs reviewed and stable and Patient moving all extremities  Post vital signs: Reviewed and stable  Complications: No apparent anesthesia complications

## 2014-08-09 NOTE — Op Note (Signed)
[facility] / [facility] [place], [id number]   OPERATIVE PROCEDURE REPORT  PATIENT :Brandi, Hunter  MR#: [id number] BIRTHDATE :[birth year] GENDER: female ENDOSCOPIST: Edmonia James, MD ASSISTANT:   Jiles Harold, technician Carolynn Comment, RN PROCEDURE DATE: August 10, 2014 PRE-PROCEDURE PREPERATION: The patient was fasted for 4 hours prior to the procedure. PRE-PROCEDURE PHYSICAL: Patient has stable vital signs.  Neck is supple.  There is no JVD, thyromegaly or LAD.  S1 and S2 regular. Chest clear to auscultation.  Abdomen soft, non-distended, non-tender with NABS. PROCEDURE:     EGD, diagnostic ASA CLASS:     Class IV INDICATIONS:     1) GERD 2) Nausea. MEDICATIONS:     Monitored anesthesia care TOPICAL ANESTHETIC:   Viscous Xylocaine-10 cc PO.  DESCRIPTION OF PROCEDURE: After the risks benefits and alternatives of the procedure were thoroughly explained, informed consent was obtained. The PENTAX GASTOROSCOPE [id number]  was introduced through the mouth and advanced to the second portion of the duodenum , without limitations. The instrument was slowly withdrawn as the mucosa was fully examined.          The esophagus, stomach and the proximal small bowel appeared normal. There were no ulcers, erosions, masses or polyps noted. Retroflexed views revealed a 3-4 cm hiatal hernia. . The scope was then withdrawn from the patient and the procedure terminated. The patient tolerated the procedure without immediate complications.  IMPRESSION:  Medium sized hiatal hernial; otherwise normal EGD.  RECOMMENDATIONS:     1.  Anti-reflux regimen to be followed. 2.  Continue current medications. 3.  OP follow-up is advised on a PRN basis.  REPEAT EXAM:  no repeat exam planned for now.  DISCHARGE INSTRUCTIONS: standard discharge instructions given _______________________________ eSigned:  Edmonia James, MD August 10, 2014 11:09 AM   CPT CODES:     50277,  EGD  DIAGNOSIS CODES:     530.81 GERD 787.02 Nausea   CC: [name], F.N.P.-B.C.  PATIENT NAME:  Brandi, Hunter MR#: [id number]

## 2014-08-09 NOTE — H&P (Addendum)
Brandi Hunter is an 73 y.o. female.   Chief Complaint: Patient is here for an EGD/Colonoscopy.  HPI: 73 year old white female with multiple medical problems listed below, here for an EGD/colonoscopy. See office notes for details.  Past Medical History  Diagnosis Date  . Pacemaker     Implanted  by Dr [name] (MDT) 10/06/10  . Mobitz (type) II atrioventricular block   . HTN (hypertension)   . Obesity   . Hyperlipidemia   . DJD (degenerative joint disease)   . Asthma   . COPD (chronic obstructive pulmonary disease)   . Pancreatitis 2010 OR 2011    pt thought this had been ruled out  . Sleep apnea     CPA SETTING OF 14  . Diabetes mellitus without complication     TYPE 2  . GERD (gastroesophageal reflux disease)   . History of home oxygen therapy     oxygen 2 liters per nasal cannula at hs   Past Surgical History  Procedure Laterality Date  . Insert / replace / remove pacemaker  10/06/10    MDT  implanted by Dr [name]  . Total abdominal hysterectomy  1971  . Cystocele repair    . Ovary surgery      removal  . Repair rectocele    . Knee arthroscopy      both  . Cataract extraction w/phaco  11/02/2011    Procedure: CATARACT EXTRACTION PHACO AND INTRAOCULAR LENS PLACEMENT (IOC);  Surgeon: Williams Che;  Location: [facility];  Service: Ophthalmology;  Laterality: Right;  CDE=7.33  . Cataract extraction w/phaco  12/07/2011    Procedure: CATARACT EXTRACTION PHACO AND INTRAOCULAR LENS PLACEMENT (IOC);  Surgeon: Williams Che, MD;  Location: [facility];  Service: Ophthalmology;  Laterality: Left;  CDE 3.61  . Eye surgery  11/01/2012    BOTH EYES CATARACTS  . Tonsillectomy  AGE 61   Family History  Problem Relation Age of Onset  . Diabetes Brother   . Heart disease Mother     CHF  . Heart disease Father     CHF  . Arthritis Sister     osteo  . Cancer Brother     prostate  . Anesthesia problems Neg Hx   . Hypotension Neg Hx   . Malignant hyperthermia Neg Hx   . Pseudochol  deficiency Neg Hx    Social History:  reports that she quit smoking about 10 months ago. Her smoking use included Cigarettes. She has a 5 pack-year smoking history. She quit smokeless tobacco use about 2 years ago. She reports that she drinks about .6 ounces of alcohol per week. She reports that she does not use illicit drugs.  Allergies:  Allergies  Allergen Reactions  . Bee Venom Swelling  . Latex Swelling    LATEX CATHETERS   Medications Prior to Admission  Medication Sig Dispense Refill  . albuterol (PROAIR HFA) 108 (90 BASE) MCG/ACT inhaler Inhale 2 puffs into the lungs every 4 (four) hours as needed for wheezing. Inhale 2 puffs into the lungs every 4 (four) hours as needed.      Marland Kitchen amLODipine (NORVASC) 10 MG tablet Take 10 mg by mouth at bedtime.      Marland Kitchen apixaban (ELIQUIS) 5 MG TABS tablet Take 1 tablet (5 mg total) by mouth 2 (two) times daily.  60 tablet  6  . atorvastatin (LIPITOR) 40 MG tablet Take 40 mg by mouth daily.       . budesonide-formoterol (SYMBICORT)  160-4.5 MCG/ACT inhaler Inhale 2 puffs into the lungs 2 (two) times daily.      . Calcium Carbonate-Vitamin D (RA CALCIUM PLUS VITAMIN D) 600-400 MG-UNIT per tablet Take 2 tablets by mouth daily.        . citalopram (CELEXA) 40 MG tablet Take 40 mg by mouth at bedtime.       . Cyanocobalamin (VITAMIN B-12) 2000 MCG TBCR 1 tablet. Take 1 tablet by mouth 2 (two) times daily.      . fenofibrate 160 MG tablet Take 160 mg by mouth every evening.      . furosemide (LASIX) 20 MG tablet Take 10 mg by mouth every Monday, Wednesday, and Friday.      Marland Kitchen glipiZIDE (GLUCOTROL XL) 10 MG 24 hr tablet Take 10 mg by mouth 2 (two) times daily.       Marland Kitchen ipratropium-albuterol (DUONEB) 0.5-2.5 (3) MG/3ML SOLN Take 3 mLs by nebulization 2 (two) times daily.      Marland Kitchen LORazepam (ATIVAN) 0.5 MG tablet Take 1 tablet by mouth every 6 (six) hours as needed for anxiety.       Marland Kitchen losartan (COZAAR) 100 MG tablet Take 100 mg by mouth every evening.      .  metFORMIN (GLUCOPHAGE) 1000 MG tablet Take 1,000 mg by mouth 2 (two) times daily with a meal.      . metoprolol succinate (TOPROL-XL) 100 MG 24 hr tablet TAKE ONE TABLET BY MOUTH ONCE DAILY  90 tablet  0  . omeprazole (PRILOSEC) 40 MG capsule Take 40 mg by mouth daily.      Marland Kitchen oxybutynin (DITROPAN-XL) 10 MG 24 hr tablet Take 1 tablet by mouth daily.      . pantoprazole (PROTONIX) 40 MG tablet Take 40 mg by mouth daily.       . Probiotic Product (ALIGN PO) Take 1 capsule by mouth daily.       Marland Kitchen spironolactone (ALDACTONE) 25 MG tablet Take 0.5 tablets (12.5 mg total) by mouth daily.  90 tablet  3   No results found for this or any previous visit (from the past 48 hour(s)). No results found.  Review of Systems  Constitutional: Negative.   Eyes: Negative.   Respiratory: Negative.   Cardiovascular: Negative.   Gastrointestinal: Positive for heartburn.  Musculoskeletal: Positive for back pain and joint pain.  Psychiatric/Behavioral: Positive for depression. The patient is nervous/anxious.     Blood pressure 164/70, temperature 98.3 F (36.8 C), temperature source Oral, resp. rate 10, height 5\' 6"  (1.676 m), weight 114.306 kg (252 lb), SpO2 96.00%. Physical Exam  Constitutional: She is oriented to person, place, and time. She appears well-developed and well-nourished.  HENT:  Head: Normocephalic and atraumatic.  Eyes: Conjunctivae and EOM are normal. Pupils are equal, round, and reactive to light.  Neck: Normal range of motion. Neck supple.  Cardiovascular: Normal rate and regular rhythm.   Respiratory: Effort normal and breath sounds normal.  GI: Soft. Bowel sounds are normal.  Neurological: She is alert and oriented to person, place, and time.  Skin: Skin is warm and dry.  Psychiatric: She has a normal mood and affect. Her behavior is normal. Judgment and thought content normal.   Assessment/Plan GERD/CRC screening; proceed with an EGD/Colonoscopy at this time.    Brandi Hunter 08/09/2014, 9:42 AM

## 2014-08-09 NOTE — Op Note (Addendum)
[facility] [place], [address]   OPERATIVE PROCEDURE REPORT  PATIENT: Brandi, Hunter  MR#: [id number] BIRTHDATE: 05-15-1941 GENDER: female ENDOSCOPIST: Edmonia James, MD ASSISTANT:   Jiles Harold, technician Carolynn Comment, RN. PROCEDURE DATE: 08/09/2014 PRE-PROCEDURE PREPARATION: The patient was prepped with a gallon of Golytely the night prior to the procedure.  The patient was fasted for 4 hours prior to the procedure. She has stopped her Eloquis 7 days prior to the procedure. PRE-PROCEDURE PHYSICAL: Patient has stable vital signs.  Neck is supple.  There is no JVD, thyromegaly or LAD.  Chest clear to auscultation.  S1 and S2 regular.  Abdomen soft, non-distended, non-tender with NABS. PROCEDURE:     Colonoscopy with cold biopsy x 1 ASA CLASS:     Class IV INDICATIONS:     1). Colorectal cancer screening 2) Family history of colon cancer-sister 3) . Rectal bleeding. MEDICATIONS:     Monitored anesthesia care.  DESCRIPTION OF PROCEDURE: After the risks, benefits, and alternatives of the procedure were thoroughly explained [including a 10% missed rate of cancer and polyps], informed consent was obtained.  Digital rectal exam was performed.  The Pentax Slim Colonicsope [id number])  was introduced through the anus  and advanced to the cecum, which was identified by both the appendix and ileocecal valve , limited by No adverse events experienced. The quality of the prep was fair at best after multiple washes were done . Multiple washes were done. Small lesions could be missed. The instrument was then slowly withdrawn as the colon was fully examined.     FINDINGS: There were a few scattered sigmoid diverticula noted. One dimunitive polyp was removed from the proximal ascending colon by cold biopsy x 1. The rest of the colonic mucosa appeared healthy with a normal vascular pattern.  No masses or AVMs were noted.  The appendiceal orifice and  the ICV were identified and photographed. Retroflexed views revealed small internal hemorrhoids.   The patient tolerated the procedure without immediate complications. The scope was then withdrawn from the patient and the procedure terminated.  TIME TO CECUM:   04 minutes 00 seconds WITHDRAW TIME:  14 minutes 00 seconds  IMPRESSION:     1.  Diverticulosis was noted in the sigmoid colon 2.  Single polyp was found in the proximal ascending colon; removed by cold biopsy x 1. 3. Small internal hemorrhoids.  RECOMMENDATIONS:     1.  Hold Aspirin and all other NSAIDS for 2 weeks. 2.  Await pathology results 3.  Continue current medications 4.  High fiber diet with liberal fluid intake. 5.  OP follow-up is advised on a PRN basis.  REPEAT EXAM:      In 5 years  for a repeat colonoscopy.  If the patient has any abnormal GI symptoms in the interim, she have been advised to contact the office as soon as possible for further recommendations.   CPT CODES:     [id number], Colonoscopy with Biopsy  DIAGNOSIS CODES:     569.3, V16.0, V76.51 Colorectal cancer screening   REFERRED WC:BJSEGB Anderson, F.N.P.-B.C.  eSigned:  Edmonia James, MD 08/09/2014 11:19 AM   PATIENT NAME:  Brandi, Hunter MR#: [id number]

## 2014-08-09 NOTE — Discharge Instructions (Addendum)
Gastrointestinal Endoscopy, Care After °Refer to this sheet in the next few weeks. These instructions provide you with information on caring for yourself after your procedure. Your caregiver may also give you more specific instructions. Your treatment has been planned according to current medical practices, but problems sometimes occur. Call your caregiver if you have any problems or questions after your procedure. °HOME CARE INSTRUCTIONS °· If you were given medicine to help you relax (sedative), do not drive, operate machinery, or sign important documents for 24 hours. °· Avoid alcohol and hot or warm beverages for the first 24 hours after the procedure. °· Only take over-the-counter or prescription medicines for pain, discomfort, or fever as directed by your caregiver. You may resume taking your normal medicines unless your caregiver tells you otherwise. Ask your caregiver when you may resume taking medicines that may cause bleeding, such as aspirin, clopidogrel, or warfarin. °· You may return to your normal diet and activities on the day after your procedure, or as directed by your caregiver. Walking may help to reduce any bloated feeling in your abdomen. °· Drink enough fluids to keep your urine clear or pale yellow. °· You may gargle with salt water if you have a sore throat. °SEEK IMMEDIATE MEDICAL CARE IF: °· You have severe nausea or vomiting. °· You have severe abdominal pain, abdominal cramps that last longer than 6 hours, or abdominal swelling (distention). °· You have severe shoulder or back pain. °· You have trouble swallowing. °· You have shortness of breath, your breathing is shallow, or you are breathing faster than normal. °· You have a fever or a rapid heartbeat. °· You vomit blood or material that looks like coffee grounds. °· You have bloody, black, or tarry stools. °MAKE SURE YOU: °· Understand these instructions. °· Will watch your condition. °· Will get help right away if you are not doing  well or get worse. °Document Released: 06/16/2004 Document Revised: 03/19/2014 Document Reviewed: 02/02/2012 °ExitCare® Patient Information ©2015 [employer]. This information is not intended to replace advice given to you by your health care provider. Make sure you discuss any questions you have with your health care provider. °Colonoscopy, Care After °Refer to this sheet in the next few weeks. These instructions provide you with information on caring for yourself after your procedure. Your health care provider may also give you more specific instructions. Your treatment has been planned according to current medical practices, but problems sometimes occur. Call your health care provider if you have any problems or questions after your procedure. °WHAT TO EXPECT AFTER THE PROCEDURE  °After your procedure, it is typical to have the following: °· A small amount of blood in your stool. °· Moderate amounts of gas and mild abdominal cramping or bloating. °HOME CARE INSTRUCTIONS °· Do not drive, operate machinery, or sign important documents for 24 hours. °· You may shower and resume your regular physical activities, but move at a slower pace for the first 24 hours. °· Take frequent rest periods for the first 24 hours. °· Walk around or put a warm pack on your abdomen to help reduce abdominal cramping and bloating. °· Drink enough fluids to keep your urine clear or pale yellow. °· You may resume your normal diet as instructed by your health care provider. Avoid heavy or fried foods that are hard to digest. °· Avoid drinking alcohol for 24 hours or as instructed by your health care provider. °· Only take over-the-counter or prescription medicines as directed by your   health care provider. °· If a tissue sample (biopsy) was taken during your procedure: °· Do not take aspirin or blood thinners for 7 days, or as instructed by your health care provider. °· Do not drink alcohol for 7 days, or as instructed by your health care  provider. °· Eat soft foods for the first 24 hours. °SEEK MEDICAL CARE IF: °You have persistent spotting of blood in your stool 2-3 days after the procedure. °SEEK IMMEDIATE MEDICAL CARE IF: °· You have more than a small spotting of blood in your stool. °· You pass large blood clots in your stool. °· Your abdomen is swollen (distended). °· You have nausea or vomiting. °· You have a fever. °· You have increasing abdominal pain that is not relieved with medicine. °Document Released: 06/16/2004 Document Revised: 08/23/2013 Document Reviewed: 07/10/2013 °ExitCare® Patient Information ©2015 [employer]. This information is not intended to replace advice given to you by your health care provider. Make sure you discuss any questions you have with your health care provider. ° °

## 2014-08-09 NOTE — Anesthesia Preprocedure Evaluation (Signed)
Anesthesia Evaluation  Patient identified by MRN, date of birth, ID band Patient awake    Reviewed: Allergy & Precautions, H&P , NPO status , Patient's Chart, lab work & pertinent test results  Airway Mallampati: II TM Distance: >3 FB Neck ROM: Full    Dental no notable dental hx. (+) Edentulous Upper, Edentulous Lower, Lower Dentures, Upper Dentures   Pulmonary sleep apnea and Continuous Positive Airway Pressure Ventilation , COPDformer smoker,  breath sounds clear to auscultation  Pulmonary exam normal       Cardiovascular hypertension, Pt. on medications + pacemaker Rhythm:Regular Rate:Normal     Neuro/Psych negative neurological ROS  negative psych ROS   GI/Hepatic negative GI ROS, Neg liver ROS,   Endo/Other  diabetes, Type 2, Oral Hypoglycemic Agents  Renal/GU negative Renal ROS  negative genitourinary   Musculoskeletal negative musculoskeletal ROS (+)   Abdominal   Peds negative pediatric ROS (+)  Hematology negative hematology ROS (+)   Anesthesia Other Findings   Reproductive/Obstetrics negative OB ROS                           Anesthesia Physical Anesthesia Plan  ASA: III  Anesthesia Plan: MAC   Post-op Pain Management:    Induction:   Airway Management Planned: Natural Airway  Additional Equipment:   Intra-op Plan:   Post-operative Plan:   Informed Consent: I have reviewed the patients History and Physical, chart, labs and discussed the procedure including the risks, benefits and alternatives for the proposed anesthesia with the patient or authorized representative who has indicated his/her understanding and acceptance.   Dental advisory given  Plan Discussed with: CRNA  Anesthesia Plan Comments:         Anesthesia Quick Evaluation

## 2014-08-10 ENCOUNTER — Encounter (HOSPITAL_COMMUNITY): Payer: Self-pay | Admitting: Gastroenterology

## 2014-08-10 NOTE — Anesthesia Postprocedure Evaluation (Signed)
  Anesthesia Post-op Note  Patient: Brandi Hunter  Procedure(s) Performed: Procedure(s) (LRB): COLONOSCOPY WITH PROPOFOL (N/A) ESOPHAGOGASTRODUODENOSCOPY (EGD) WITH PROPOFOL (N/A)  Patient Location: PACU  Anesthesia Type: MAC  Level of Consciousness: awake and alert   Airway and Oxygen Therapy: Patient Spontanous Breathing  Post-op Pain: mild  Post-op Assessment: Post-op Vital signs reviewed, Patient's Cardiovascular Status Stable, Respiratory Function Stable, Patent Airway and No signs of Nausea or vomiting  Last Vitals:  Filed Vitals:   08/09/14 1128  BP: 158/74  Pulse: 59  Temp:   Resp: 15    Post-op Vital Signs: stable   Complications: No apparent anesthesia complications

## 2014-08-13 ENCOUNTER — Other Ambulatory Visit: Payer: Self-pay | Admitting: Internal Medicine

## 2014-09-26 ENCOUNTER — Ambulatory Visit (INDEPENDENT_AMBULATORY_CARE_PROVIDER_SITE_OTHER): Payer: Medicare Other | Admitting: Pulmonary Disease

## 2014-09-26 ENCOUNTER — Encounter: Payer: Self-pay | Admitting: Pulmonary Disease

## 2014-09-26 ENCOUNTER — Encounter (INDEPENDENT_AMBULATORY_CARE_PROVIDER_SITE_OTHER): Payer: Self-pay

## 2014-09-26 VITALS — BP 124/70 | HR 85 | Temp 97.5°F | Ht 66.0 in | Wt 254.4 lb

## 2014-09-26 DIAGNOSIS — G4733 Obstructive sleep apnea (adult) (pediatric): Secondary | ICD-10-CM

## 2014-09-26 DIAGNOSIS — J449 Chronic obstructive pulmonary disease, unspecified: Secondary | ICD-10-CM

## 2014-09-26 NOTE — Assessment & Plan Note (Signed)
The patient feels that she is stable from a pulmonary standpoint, and has only had one episode requiring prednisone in the last year. Unfortunately, she has continued to smoke, and I have counseled her on total smoking cessation. I have asked her to continue on her maintenance inhalers, but suspect she will not require these if she is able to stop smoking.

## 2014-09-26 NOTE — Assessment & Plan Note (Signed)
The patient is wearing C Pap compliantly, and for the most part feels that she sleeps well with the device. She has been keeping up with her mask changes and supplies, and has actually lost 16 pounds since the last visit. I have commended her on this, and asked her to continue doing so.

## 2014-09-26 NOTE — Patient Instructions (Signed)
Continue on cpap, and keep up with mask changes and supplies. Try and stop smoking 100%. No change in breathing medications. followup with me again in one year if doing well.

## 2014-09-26 NOTE — Progress Notes (Signed)
   Subjective:    Patient ID: Brandi Hunter, female    DOB: [birth year], 73 y.o.   MRN: [id number]  HPI The patient comes in today for follow-up of her known obstructive sleep apnea, as well as minimal airflow obstruction that is primarily related to chronic asthmatic bronchitis associated with her ongoing smoking and airway inflammation. She is continuing on her Symbicort with as needed albuterol, and has only had one episode in the last year that required a course of prednisone. She feels that her breathing is stable from the last visit, and has actually lost 16 pounds. She is also continuing on C Pap compliantly, and is having no issues with her mask fit or pressure. She feels she sleeps adequately, and is rested the vast majority of the mornings.   Review of Systems  Constitutional: Negative for fever and unexpected weight change.  HENT: Negative for congestion, dental problem, ear pain, nosebleeds, postnasal drip, rhinorrhea, sinus pressure, sneezing, sore throat and trouble swallowing.   Eyes: Negative for redness and itching.  Respiratory: Positive for shortness of breath. Negative for cough, chest tightness and wheezing.   Cardiovascular: Negative for palpitations and leg swelling.  Gastrointestinal: Negative for nausea and vomiting.  Genitourinary: Negative for dysuria.  Musculoskeletal: Negative for joint swelling.  Skin: Negative for rash.  Neurological: Negative for headaches.  Hematological: Does not bruise/bleed easily.  Psychiatric/Behavioral: Negative for dysphoric mood. The patient is not nervous/anxious.        Objective:   Physical Exam Obese female in no acute distress Nose without purulence or discharge noted Neck without lymphadenopathy or thyromegaly No skin breakdown or pressure necrosis from the C Pap mask Chest totally clear to auscultation, no wheezing Cardiac exam with regular rate and rhythm Lower extremities with 1+ edema bilaterally, no cyanosis Alert and  oriented, does not appear to be sleepy, moves all 4 extremities.       Assessment & Plan:

## 2014-10-17 ENCOUNTER — Other Ambulatory Visit: Payer: Self-pay

## 2014-10-19 ENCOUNTER — Encounter: Payer: Self-pay | Admitting: Internal Medicine

## 2014-10-19 ENCOUNTER — Ambulatory Visit (INDEPENDENT_AMBULATORY_CARE_PROVIDER_SITE_OTHER): Payer: Medicare Other | Admitting: Internal Medicine

## 2014-10-19 VITALS — BP 140/82 | HR 71 | Ht 66.0 in | Wt 242.6 lb

## 2014-10-19 DIAGNOSIS — I10 Essential (primary) hypertension: Secondary | ICD-10-CM

## 2014-10-19 DIAGNOSIS — I480 Paroxysmal atrial fibrillation: Secondary | ICD-10-CM

## 2014-10-19 DIAGNOSIS — I441 Atrioventricular block, second degree: Secondary | ICD-10-CM

## 2014-10-19 LAB — MDC_IDC_ENUM_SESS_TYPE_INCLINIC
Battery Impedance: 228 Ohm
Battery Voltage: 2.78 V
Brady Statistic AP VP Percent: 66 %
Brady Statistic AP VS Percent: 0 %
Brady Statistic AS VP Percent: 21 %
Date Time Interrogation Session: 20151204160046
Lead Channel Impedance Value: 460 Ohm
Lead Channel Pacing Threshold Amplitude: 0.75 V
Lead Channel Pacing Threshold Amplitude: 0.75 V
Lead Channel Pacing Threshold Pulse Width: 0.4 ms
Lead Channel Setting Pacing Amplitude: 2.5 V
Lead Channel Setting Sensing Sensitivity: 4 mV
MDC IDC MSMT BATTERY REMAINING LONGEVITY: 106 mo
MDC IDC MSMT LEADCHNL RA SENSING INTR AMPL: 2 mV
MDC IDC MSMT LEADCHNL RV IMPEDANCE VALUE: 515 Ohm
MDC IDC MSMT LEADCHNL RV PACING THRESHOLD PULSEWIDTH: 0.4 ms
MDC IDC MSMT LEADCHNL RV SENSING INTR AMPL: 11.2 mV
MDC IDC SET LEADCHNL RA PACING AMPLITUDE: 2 V
MDC IDC SET LEADCHNL RV PACING PULSEWIDTH: 0.4 ms
MDC IDC STAT BRADY AS VS PERCENT: 13 %

## 2014-10-19 NOTE — Progress Notes (Signed)
GHW:EXHBZJIR,CVELFY, FNP  The patient presents today for electrophysiology followup.  Since last being seen in our clinic, the patient reports doing very well.  Today, she denies symptoms of palpitations, chest pain, shortness of breath, orthopnea, PND, dizziness, presyncope, further syncope, or neurologic sequela.  She has had no recent AFib. The patient feels that she is tolerating medications without difficulties and is otherwise without complaint today.   Past Medical History  Diagnosis Date  . Pacemaker     Implanted  by Dr [name] (MDT) 10/06/10  . Mobitz (type) II atrioventricular block   . HTN (hypertension)   . Obesity   . Hyperlipidemia   . DJD (degenerative joint disease)   . Asthma   . COPD (chronic obstructive pulmonary disease)   . Pancreatitis 2010 OR 2011    pt thought this had been ruled out  . Sleep apnea     CPA SETTING OF 14  . Diabetes mellitus without complication     TYPE 2  . GERD (gastroesophageal reflux disease)   . History of home oxygen therapy     oxygen 2 liters per nasal cannula at hs   Past Surgical History  Procedure Laterality Date  . Insert / replace / remove pacemaker  10/06/10    MDT  implanted by Dr [name]  . Total abdominal hysterectomy  1971  . Cystocele repair    . Ovary surgery      removal  . Repair rectocele    . Knee arthroscopy      both  . Cataract extraction w/phaco  11/02/2011    Procedure: CATARACT EXTRACTION PHACO AND INTRAOCULAR LENS PLACEMENT (IOC);  Surgeon: Williams Che;  Location: [facility];  Service: Ophthalmology;  Laterality: Right;  CDE=7.33  . Cataract extraction w/phaco  12/07/2011    Procedure: CATARACT EXTRACTION PHACO AND INTRAOCULAR LENS PLACEMENT (IOC);  Surgeon: Williams Che, MD;  Location: [facility];  Service: Ophthalmology;  Laterality: Left;  CDE 3.61  . Eye surgery  11/01/2012    BOTH EYES CATARACTS  . Tonsillectomy  AGE 73  . Colonoscopy with propofol N/A 08/09/2014    Procedure: COLONOSCOPY WITH  PROPOFOL;  Surgeon: Juanita Craver, MD;  Location: [facility];  Service: Endoscopy;  Laterality: N/A;  . Esophagogastroduodenoscopy (egd) with propofol N/A 08/09/2014    Procedure: ESOPHAGOGASTRODUODENOSCOPY (EGD) WITH PROPOFOL;  Surgeon: Juanita Craver, MD;  Location: [facility];  Service: Endoscopy;  Laterality: N/A;    Current Outpatient Prescriptions  Medication Sig Dispense Refill  . albuterol (PROAIR HFA) 108 (90 BASE) MCG/ACT inhaler Inhale 2 puffs into the lungs every 4 (four) hours as needed for wheezing. Inhale 2 puffs into the lungs every 4 (four) hours as needed.    Marland Kitchen amLODipine (NORVASC) 10 MG tablet Take 10 mg by mouth at bedtime.    Marland Kitchen apixaban (ELIQUIS) 5 MG TABS tablet Take 1 tablet (5 mg total) by mouth 2 (two) times daily. 60 tablet 6  . atorvastatin (LIPITOR) 40 MG tablet Take 40 mg by mouth daily.     . budesonide-formoterol (SYMBICORT) 160-4.5 MCG/ACT inhaler Inhale 2 puffs into the lungs 2 (two) times daily.    . Calcium Carbonate-Vitamin D (RA CALCIUM PLUS VITAMIN D) 600-400 MG-UNIT per tablet Take 2 tablets by mouth daily.      . citalopram (CELEXA) 40 MG tablet Take 40 mg by mouth at bedtime.     . Cyanocobalamin (VITAMIN B-12) 2000 MCG TBCR Take 1 tablet by mouth 2 (two) times daily.    Marland Kitchen  fenofibrate 160 MG tablet Take 160 mg by mouth every evening.    . furosemide (LASIX) 20 MG tablet Take 10 mg by mouth every Monday, Wednesday, and Friday.    Marland Kitchen glipiZIDE (GLUCOTROL XL) 10 MG 24 hr tablet Take 10 mg by mouth 2 (two) times daily.     Marland Kitchen ipratropium-albuterol (DUONEB) 0.5-2.5 (3) MG/3ML SOLN Take 3 mLs by nebulization 2 (two) times daily.    Marland Kitchen LORazepam (ATIVAN) 0.5 MG tablet Take 1 tablet by mouth every 6 (six) hours as needed for anxiety.     Marland Kitchen losartan (COZAAR) 100 MG tablet Take 100 mg by mouth every evening.    . metFORMIN (GLUCOPHAGE) 1000 MG tablet Take 1,000 mg by mouth 2 (two) times daily with a meal.    . metoprolol succinate (TOPROL-XL) 100 MG 24 hr tablet TAKE  ONE TABLET BY MOUTH ONCE DAILY 90 tablet 0  . oxybutynin (DITROPAN-XL) 10 MG 24 hr tablet Take 1 tablet by mouth daily.    . pantoprazole (PROTONIX) 40 MG tablet Take 40 mg by mouth daily.     . Probiotic Product (ALIGN PO) Take 1 capsule by mouth daily.     Marland Kitchen spironolactone (ALDACTONE) 25 MG tablet Take 0.5 tablets (12.5 mg total) by mouth daily. 90 tablet 3   No current facility-administered medications for this visit.    Allergies  Allergen Reactions  . Bee Venom Swelling  . Latex Swelling    LATEX CATHETERS    History   Social History  . Marital Status: Widowed    Spouse Name: N/A    Number of Children: 3  . Years of Education: College   Occupational History  . Part time receptionist    Social History Main Topics  . Smoking status: Current Some Day Smoker -- 0.10 packs/day for 50 years    Types: Cigarettes    Last Attempt to Quit: 07/17/2014  . Smokeless tobacco: Former Systems developer    Quit date: 11/17/2011  . Alcohol Use: 0.6 oz/week    1 Glasses of wine per week     Comment: couple glasses of wine occasionally-once a month  . Drug Use: No  . Sexual Activity: Not on file   Other Topics Concern  . Not on file   Social History Narrative   Patient lives at home alone.   Caffeine Use: 16oz drink daily    Family History  Problem Relation Age of Onset  . Diabetes Brother   . Heart disease Mother     CHF  . Heart disease Father     CHF  . Arthritis Sister     osteo  . Cancer Brother     prostate  . Anesthesia problems Neg Hx   . Hypotension Neg Hx   . Malignant hyperthermia Neg Hx   . Pseudochol deficiency Neg Hx      Physical Exam: Filed Vitals:   10/19/14 1053  BP: 140/82  Pulse: 71  Height: 5\' 6"  (1.676 m)  Weight: 242 lb 9.6 oz (110.043 kg)    GEN- The patient is well appearing, alert and oriented x 3 today.   Head- normocephalic, atraumatic Eyes-  Sclera clear, conjunctiva pink Ears- hearing intact Oropharynx- clear Neck- supple, no  JVP Lymph- no cervical lymphadenopathy Lungs- Clear to ausculation bilaterally, normal work of breathing Chest- pacemaker pocket is well healed Heart- Regular rate and rhythm, no murmurs, rubs or gallops, PMI not laterally displaced GI- soft, NT, ND, + BS Extremities- no clubbing, cyanosis, or edema  Pacemaker interrogation- reviewed in detail today,  See PACEART report  Assessment and Plan:  1. Mobitz II second degree AV block Normal pacemaker function See Pace Art report No changes today  2. HTN Stable today, she reports good BP control at home Could increase lasix or add spironolactone if BP remains elevated  3. Obesity Weight loss is advised Body mass index is 39.18 kg/(m^2).   4. Tobacco I have strongly encouraged her to quit  5. afib Maintaining sinus Labs by PCP per patient  Return to see Truitt Merle in 6 months I will see again in 1 year [facility]

## 2014-10-19 NOTE — Patient Instructions (Addendum)
Your physician wants you to follow-up in: 6 months with Brandi Merle, NP and 12 months with Dr. Vallery Hunter will receive a reminder letter in the mail two months in advance. If you don't receive a letter, please call our office to schedule the follow-up appointment.  Remote monitoring is used to monitor your Pacemaker or ICD from home. This monitoring reduces the number of office visits required to check your device to one time per year. It allows [place] to keep an eye on the functioning of your device to ensure it is working properly. You are scheduled for a device check from home on 01/21/15. You may send your transmission at any time that day. If you have a wireless device, the transmission will be sent automatically. After your physician reviews your transmission, you will receive a postcard with your next transmission date.

## 2014-10-25 ENCOUNTER — Other Ambulatory Visit: Payer: Self-pay | Admitting: Nurse Practitioner

## 2014-10-25 ENCOUNTER — Other Ambulatory Visit: Payer: Self-pay | Admitting: Internal Medicine

## 2014-12-18 ENCOUNTER — Other Ambulatory Visit: Payer: Self-pay | Admitting: Nurse Practitioner

## 2014-12-18 DIAGNOSIS — Z1231 Encounter for screening mammogram for malignant neoplasm of breast: Secondary | ICD-10-CM

## 2014-12-18 DIAGNOSIS — Z780 Asymptomatic menopausal state: Secondary | ICD-10-CM

## 2014-12-19 ENCOUNTER — Other Ambulatory Visit: Payer: Self-pay | Admitting: Nurse Practitioner

## 2014-12-19 DIAGNOSIS — E2839 Other primary ovarian failure: Secondary | ICD-10-CM

## 2015-01-01 ENCOUNTER — Ambulatory Visit: Payer: Medicare Other

## 2015-01-01 ENCOUNTER — Other Ambulatory Visit: Payer: Medicare Other

## 2015-01-07 ENCOUNTER — Inpatient Hospital Stay (HOSPITAL_COMMUNITY)
Admission: EM | Admit: 2015-01-07 | Discharge: 2015-01-10 | DRG: 572 | Disposition: A | Discharge status: Home / Self Care | Payer: Medicare Other | Attending: Internal Medicine | Admitting: Internal Medicine

## 2015-01-07 ENCOUNTER — Other Ambulatory Visit (INDEPENDENT_AMBULATORY_CARE_PROVIDER_SITE_OTHER): Payer: Self-pay | Admitting: General Surgery

## 2015-01-07 ENCOUNTER — Encounter (HOSPITAL_COMMUNITY): Payer: Self-pay | Admitting: *Deleted

## 2015-01-07 DIAGNOSIS — E669 Obesity, unspecified: Secondary | ICD-10-CM | POA: Diagnosis present

## 2015-01-07 DIAGNOSIS — I4821 Permanent atrial fibrillation: Secondary | ICD-10-CM | POA: Diagnosis present

## 2015-01-07 DIAGNOSIS — Z91040 Latex allergy status: Secondary | ICD-10-CM | POA: Diagnosis not present

## 2015-01-07 DIAGNOSIS — Z79899 Other long term (current) drug therapy: Secondary | ICD-10-CM

## 2015-01-07 DIAGNOSIS — F17210 Nicotine dependence, cigarettes, uncomplicated: Secondary | ICD-10-CM | POA: Diagnosis present

## 2015-01-07 DIAGNOSIS — E785 Hyperlipidemia, unspecified: Secondary | ICD-10-CM | POA: Diagnosis present

## 2015-01-07 DIAGNOSIS — L02419 Cutaneous abscess of limb, unspecified: Secondary | ICD-10-CM | POA: Diagnosis present

## 2015-01-07 DIAGNOSIS — I441 Atrioventricular block, second degree: Secondary | ICD-10-CM | POA: Diagnosis present

## 2015-01-07 DIAGNOSIS — Z91030 Bee allergy status: Secondary | ICD-10-CM

## 2015-01-07 DIAGNOSIS — Z79891 Long term (current) use of opiate analgesic: Secondary | ICD-10-CM | POA: Diagnosis not present

## 2015-01-07 DIAGNOSIS — Z833 Family history of diabetes mellitus: Secondary | ICD-10-CM

## 2015-01-07 DIAGNOSIS — Z66 Do not resuscitate: Secondary | ICD-10-CM | POA: Diagnosis present

## 2015-01-07 DIAGNOSIS — L03112 Cellulitis of left axilla: Secondary | ICD-10-CM | POA: Diagnosis present

## 2015-01-07 DIAGNOSIS — Z8619 Personal history of other infectious and parasitic diseases: Secondary | ICD-10-CM | POA: Diagnosis not present

## 2015-01-07 DIAGNOSIS — G4733 Obstructive sleep apnea (adult) (pediatric): Secondary | ICD-10-CM | POA: Diagnosis present

## 2015-01-07 DIAGNOSIS — Z7901 Long term (current) use of anticoagulants: Secondary | ICD-10-CM | POA: Diagnosis not present

## 2015-01-07 DIAGNOSIS — Z8249 Family history of ischemic heart disease and other diseases of the circulatory system: Secondary | ICD-10-CM | POA: Diagnosis not present

## 2015-01-07 DIAGNOSIS — J449 Chronic obstructive pulmonary disease, unspecified: Secondary | ICD-10-CM | POA: Diagnosis present

## 2015-01-07 DIAGNOSIS — Z90710 Acquired absence of both cervix and uterus: Secondary | ICD-10-CM

## 2015-01-07 DIAGNOSIS — L02412 Cutaneous abscess of left axilla: Secondary | ICD-10-CM | POA: Diagnosis present

## 2015-01-07 DIAGNOSIS — K219 Gastro-esophageal reflux disease without esophagitis: Secondary | ICD-10-CM | POA: Diagnosis present

## 2015-01-07 DIAGNOSIS — M1990 Unspecified osteoarthritis, unspecified site: Secondary | ICD-10-CM | POA: Diagnosis present

## 2015-01-07 DIAGNOSIS — Z950 Presence of cardiac pacemaker: Secondary | ICD-10-CM | POA: Diagnosis not present

## 2015-01-07 DIAGNOSIS — I480 Paroxysmal atrial fibrillation: Secondary | ICD-10-CM | POA: Diagnosis present

## 2015-01-07 DIAGNOSIS — E119 Type 2 diabetes mellitus without complications: Secondary | ICD-10-CM | POA: Diagnosis present

## 2015-01-07 DIAGNOSIS — J45909 Unspecified asthma, uncomplicated: Secondary | ICD-10-CM | POA: Diagnosis present

## 2015-01-07 DIAGNOSIS — I10 Essential (primary) hypertension: Secondary | ICD-10-CM | POA: Diagnosis present

## 2015-01-07 LAB — CBC WITH DIFFERENTIAL/PLATELET
Basophils Absolute: 0 10*3/uL (ref 0.0–0.1)
Basophils Relative: 0 % (ref 0–1)
EOS ABS: 0.4 10*3/uL (ref 0.0–0.7)
EOS PCT: 6 % — AB (ref 0–5)
HEMATOCRIT: 37.8 % (ref 36.0–46.0)
Hemoglobin: 12.1 g/dL (ref 12.0–15.0)
Lymphocytes Relative: 30 % (ref 12–46)
Lymphs Abs: 2.2 10*3/uL (ref 0.7–4.0)
MCH: 28.6 pg (ref 26.0–34.0)
MCHC: 32 g/dL (ref 30.0–36.0)
MCV: 89.4 fL (ref 78.0–100.0)
Monocytes Absolute: 0.7 10*3/uL (ref 0.1–1.0)
Monocytes Relative: 9 % (ref 3–12)
NEUTROS PCT: 55 % (ref 43–77)
Neutro Abs: 4.2 10*3/uL (ref 1.7–7.7)
Platelets: 355 10*3/uL (ref 150–400)
RBC: 4.23 MIL/uL (ref 3.87–5.11)
RDW: 13.4 % (ref 11.5–15.5)
WBC: 7.5 10*3/uL (ref 4.0–10.5)

## 2015-01-07 LAB — COMPREHENSIVE METABOLIC PANEL
ALBUMIN: 3.5 g/dL (ref 3.5–5.2)
ALT: 16 U/L (ref 0–35)
ANION GAP: 10 (ref 5–15)
AST: 20 U/L (ref 0–37)
Alkaline Phosphatase: 35 U/L — ABNORMAL LOW (ref 39–117)
BUN: 18 mg/dL (ref 6–23)
CO2: 27 mmol/L (ref 19–32)
CREATININE: 0.94 mg/dL (ref 0.50–1.10)
Calcium: 9.5 mg/dL (ref 8.4–10.5)
Chloride: 101 mmol/L (ref 96–112)
GFR calc Af Amer: 69 mL/min — ABNORMAL LOW (ref >90)
GFR, EST NON AFRICAN AMERICAN: 60 mL/min — AB (ref >90)
GLUCOSE: 124 mg/dL — AB (ref 70–99)
POTASSIUM: 4 mmol/L (ref 3.5–5.1)
Sodium: 138 mmol/L (ref 135–145)
Total Bilirubin: 0.7 mg/dL (ref 0.3–1.2)
Total Protein: 6.6 g/dL (ref 6.0–8.3)

## 2015-01-07 MED ORDER — VANCOMYCIN HCL IN DEXTROSE 1-5 GM/200ML-% IV SOLN
1000.0000 mg | Freq: Once | INTRAVENOUS | Status: AC
  Administered 2015-01-07: 1000 mg via INTRAVENOUS
  Filled 2015-01-07: qty 200

## 2015-01-07 NOTE — ED Notes (Signed)
Pt started developing left axilla abscess 2/16, on 2/18 pt went to pcp who sent pt to ED, had abscess lanced, and given IV abx, pt had reddness spreading to much of upper arm, ED marked red/swollen areas. On Friday started PO clindamycin. On Saturday returned to ED, given IV abx again. Extended arm redness gone Sunday. Now pt has red swollen area size of a lemon in left axilla area. Denies pain. Pt went to pcp today, who sent pt to surgeon, who stated the abscess was too large to I&D in office especially because pt is on blood thinner, pt sent to ED.

## 2015-01-07 NOTE — ED Provider Notes (Signed)
CSN: [id number]     Arrival date & time 01/07/15  1726 History   First MD Initiated Contact with Patient 01/07/15 2218     Chief Complaint  Patient presents with  . Abscess     (Consider location/radiation/quality/duration/timing/severity/associated sxs/prior Treatment) Patient is a 74 y.o. female presenting with abscess. The history is provided by the patient.  Abscess Location:  Shoulder/arm Shoulder/arm abscess location:  L axilla Associated symptoms: no fever    patient presented with an abscess in her left axilla. She had had some swelling and was seen and [facility]. On Thursday. Also had an attempted I and D and had been on IV antibiotics twice in the ER. Had also been on oral clindamycin. Has had some chills. She is diabetic. Was seen by her PCP and general surgery today the Center in for admission. She is on anticoagulation for A. fib. Reportedly abscesses to bake for training in the office and will need operative drainage.  Past Medical History  Diagnosis Date  . Pacemaker     Implanted  by Dr [name] (MDT) 10/06/10  . Mobitz (type) II atrioventricular block   . HTN (hypertension)   . Obesity   . Hyperlipidemia   . DJD (degenerative joint disease)   . Asthma   . COPD (chronic obstructive pulmonary disease)   . Pancreatitis 2010 OR 2011    pt thought this had been ruled out  . Sleep apnea     CPA SETTING OF 14  . Diabetes mellitus without complication     TYPE 2  . GERD (gastroesophageal reflux disease)   . History of home oxygen therapy     oxygen 2 liters per nasal cannula at hs   Past Surgical History  Procedure Laterality Date  . Insert / replace / remove pacemaker  10/06/10    MDT  implanted by Dr [name]  . Total abdominal hysterectomy  1971  . Cystocele repair    . Ovary surgery      removal  . Repair rectocele    . Knee arthroscopy      both  . Cataract extraction w/phaco  11/02/2011    Procedure: CATARACT EXTRACTION PHACO AND INTRAOCULAR LENS  PLACEMENT (IOC);  Surgeon: Williams Che;  Location: [facility];  Service: Ophthalmology;  Laterality: Right;  CDE=7.33  . Cataract extraction w/phaco  12/07/2011    Procedure: CATARACT EXTRACTION PHACO AND INTRAOCULAR LENS PLACEMENT (IOC);  Surgeon: Williams Che, MD;  Location: [facility];  Service: Ophthalmology;  Laterality: Left;  CDE 3.61  . Eye surgery  11/01/2012    BOTH EYES CATARACTS  . Tonsillectomy  AGE 53  . Colonoscopy with propofol N/A 08/09/2014    Procedure: COLONOSCOPY WITH PROPOFOL;  Surgeon: Juanita Craver, MD;  Location: [facility];  Service: Endoscopy;  Laterality: N/A;  . Esophagogastroduodenoscopy (egd) with propofol N/A 08/09/2014    Procedure: ESOPHAGOGASTRODUODENOSCOPY (EGD) WITH PROPOFOL;  Surgeon: Juanita Craver, MD;  Location: [facility];  Service: Endoscopy;  Laterality: N/A;   Family History  Problem Relation Age of Onset  . Diabetes Brother   . Heart disease Mother     CHF  . Heart disease Father     CHF  . Arthritis Sister     osteo  . Cancer Brother     prostate  . Anesthesia problems Neg Hx   . Hypotension Neg Hx   . Malignant hyperthermia Neg Hx   . Pseudochol deficiency Neg Hx    History  Substance Use Topics  .  Smoking status: Current Some Day Smoker -- 0.10 packs/day for 50 years    Types: Cigarettes    Last Attempt to Quit: 07/17/2014  . Smokeless tobacco: Former Systems developer    Quit date: 11/17/2011  . Alcohol Use: 0.6 oz/week    1 Glasses of wine per week     Comment: couple glasses of wine occasionally-once a month   OB History    No data available     Review of Systems  Constitutional: Positive for chills. Negative for fever.  Respiratory: Negative for shortness of breath.   Cardiovascular: Negative for chest pain.  Gastrointestinal: Negative for abdominal pain.  Genitourinary: Negative for flank pain.  Skin: Positive for wound.  Hematological: Negative for adenopathy.      Allergies  Bee venom and Latex  Home Medications   Prior  to Admission medications   Medication Sig Start Date End Date Taking? Authorizing Provider  albuterol (PROAIR HFA) 108 (90 BASE) MCG/ACT inhaler Inhale 2 puffs into the lungs every 4 (four) hours as needed for wheezing. Inhale 2 puffs into the lungs every 4 (four) hours as needed. 07/21/12  Yes Historical Provider, MD  amLODipine (NORVASC) 10 MG tablet TAKE ONE TABLET BY MOUTH ONCE DAILY 10/25/14  Yes Thompson Grayer, MD  apixaban (ELIQUIS) 5 MG TABS tablet Take 1 tablet (5 mg total) by mouth 2 (two) times daily. 05/24/14  Yes Thompson Grayer, MD  atorvastatin (LIPITOR) 40 MG tablet Take 40 mg by mouth daily.  03/17/13  Yes Historical Provider, MD  budesonide-formoterol (SYMBICORT) 160-4.5 MCG/ACT inhaler Inhale 2 puffs into the lungs 2 (two) times daily.   Yes Historical Provider, MD  Calcium Carbonate-Vitamin D (RA CALCIUM PLUS VITAMIN D) 600-400 MG-UNIT per tablet Take 2 tablets by mouth daily.     Yes Historical Provider, MD  citalopram (CELEXA) 40 MG tablet Take 40 mg by mouth at bedtime.    Yes Historical Provider, MD  clindamycin (CLEOCIN) 150 MG capsule Take 150 mg by mouth 3 (three) times daily. 01/04/15  Yes Historical Provider, MD  Cyanocobalamin (VITAMIN B-12) 2000 MCG TBCR Take 1 tablet by mouth 2 (two) times daily. 07/21/12  Yes Historical Provider, MD  fenofibrate 160 MG tablet Take 160 mg by mouth every evening.   Yes Historical Provider, MD  furosemide (LASIX) 20 MG tablet Take 20 mg by mouth every Monday, Wednesday, and Friday.    Yes Historical Provider, MD  glipiZIDE (GLUCOTROL XL) 10 MG 24 hr tablet Take 10 mg by mouth 2 (two) times daily.  08/20/13  Yes Historical Provider, MD  HYDROcodone-acetaminophen (NORCO/VICODIN) 5-325 MG per tablet Take 1 tablet by mouth every 6 (six) hours as needed for moderate pain.   Yes Historical Provider, MD  ipratropium-albuterol (DUONEB) 0.5-2.5 (3) MG/3ML SOLN Take 3 mLs by nebulization 2 (two) times daily.   Yes Historical Provider, MD  LORazepam (ATIVAN) 0.5  MG tablet Take 1 tablet by mouth every 6 (six) hours as needed for anxiety.  04/28/13  Yes Historical Provider, MD  losartan (COZAAR) 100 MG tablet Take 100 mg by mouth every evening.   Yes Historical Provider, MD  metFORMIN (GLUCOPHAGE) 1000 MG tablet Take 1,000 mg by mouth 2 (two) times daily with a meal.   Yes Historical Provider, MD  metoprolol succinate (TOPROL-XL) 100 MG 24 hr tablet TAKE ONE TABLET BY MOUTH ONCE DAILY 10/25/14  Yes Thompson Grayer, MD  oxybutynin (DITROPAN-XL) 10 MG 24 hr tablet Take 10 mg by mouth daily.  07/19/13  Yes Historical Provider,  MD  pantoprazole (PROTONIX) 40 MG tablet Take 40 mg by mouth daily.  08/21/13  Yes Historical Provider, MD  potassium chloride (K-DUR,KLOR-CON) 10 MEQ tablet Take 10 mEq by mouth daily.   Yes Historical Provider, MD  Probiotic Product (ALIGN PO) Take 1 capsule by mouth daily.    Yes Historical Provider, MD  spironolactone (ALDACTONE) 25 MG tablet Take 0.5 tablets (12.5 mg total) by mouth daily. 10/27/13  Yes [name], NP   BP 152/50 mmHg  Pulse 61  Temp(Src) 97.9 F (36.6 C) (Oral)  Resp 18  SpO2 95% Physical Exam  Constitutional: She is oriented to person, place, and time. She appears well-developed and well-nourished.  Patient is obese  HENT:  Head: Normocephalic and atraumatic.  Eyes: EOM are normal. Pupils are equal, round, and reactive to light.  Neck: Normal range of motion. Neck supple.  Cardiovascular: Normal rate and regular rhythm.   No murmur heard. Pulmonary/Chest: Effort normal and breath sounds normal. No respiratory distress.  Abdominal: Soft. Bowel sounds are normal. She exhibits no distension.  Musculoskeletal: Normal range of motion.  Neurological: She is alert and oriented to person, place, and time. No cranial nerve deficit.  Skin: Skin is warm and dry.  Large tender fluctuant area around the size of a lemon in her left axilla. Some surrounding erythema. No drainage.  Psychiatric: She has a normal mood and  affect. Her speech is normal.  Nursing note and vitals reviewed.   ED Course  Procedures (including critical care time) Labs Review Labs Reviewed  CBC WITH DIFFERENTIAL/PLATELET - Abnormal; Notable for the following:    Eosinophils Relative 6 (*)    All other components within normal limits  COMPREHENSIVE METABOLIC PANEL - Abnormal; Notable for the following:    Glucose, Bld 124 (*)    Alkaline Phosphatase 35 (*)    GFR calc non Af Amer 60 (*)    GFR calc Af Amer 69 (*)    All other components within normal limits    Imaging Review No results found.   EKG Interpretation None      MDM   Final diagnoses:  Axillary abscess  Cellulitis of left axilla    Issue with left axillary abscess. Has seen general surgery already was sent in for admission. May have operation in a day or 2. Well-appearing. Will admit to internal medicine.    Jasper Riling. Alvino Chapel, MD 01/07/15 [phone]

## 2015-01-07 NOTE — Progress Notes (Signed)
Brandi Hunter 01/07/2015 3:51 PM Location: [facility] Patient #: [id number] DOB: 1941/04/30 Widowed / Language: [name] / Race: White Female History of Present Illness [name] M. Chamille Werntz MD; 01/07/2015 5:07 PM) Patient words: abscess left axilla.  The patient is a 74 year old female who presents with a subcutaneous abscess. She is referred by [name], PA for evaluation of left axillary abscess. The patient has diabetes mellitus type 2, obstructive sleep apnea on CPAP, hypertension, atrial fibrillation on chronic anticoagulation he developed some cellulitis of her left upper extremity last week. She presented to [facility] Emergency Room on Thursday and Sat and was given IV abx (vanc) for her LUE cellulitis. She also developed a abscess in her axilla and a small I&D was done by the EDP without any results. She saw her PCP office today and while her LUE cellulitis she was found to have significant L axillary abscess and was sent to our office for possible I&D. She is on Eliquis for her afib. Her last dose was last night. she has been on clindamycin. She does smoke occasionally. Other Problems [name], MD; 01/07/2015 5:11 PM) Bladder Problems Gastroesophageal Reflux Disease Home Oxygen Use Hypercholesterolemia Oophorectomy Bilateral. Pancreatitis Sleep Apnea Chronic Obstructive Lung Disease Diabetes Mellitus Atrial Fibrillation High blood pressure ABSCESS OF AXILLA, LEFT (682.3  L02.412) ON CONTINUOUS ORAL ANTICOAGULATION (V58.61  Z79.01)  Past Surgical History ([name], LPN; 1/51/7616 0:73 PM) Appendectomy Cataract Surgery Bilateral. Hysterectomy (not due to cancer) - Complete Knee Surgery Bilateral. Tonsillectomy  Diagnostic Studies History ([name], LPN; 05/25/6268 4:85 PM) Colonoscopy 1-5 years ago Mammogram 1-3 years ago Pap Smear 1-5 years ago  Allergies ([name], LPN; 4/62/7035 0:09 PM) Latex Exam Gloves *MEDICAL  DEVICES AND SUPPLIES*  Medication History ([name], LPN; 3/81/8299 3:71 PM) Hydrocodone-Acetaminophen (5-325MG  Tablet, Oral) Active. LORazepam (0.5MG  Tablet, Oral) Active. AmLODIPine Besylate (10MG  Tablet, Oral) Active. Clindamycin HCl (150MG  Capsule, Oral) Active. Atorvastatin Calcium (40MG  Tablet, Oral) Active. Eliquis (5MG  Tablet, Oral) Active. Fenofibrate (160MG  Tablet, Oral) Active. Furosemide (20MG  Tablet, Oral) Active. GlipiZIDE ER (10MG  Tablet ER 24HR, Oral) Active. Losartan Potassium (100MG  Tablet, Oral) Active. MetFORMIN HCl (1000MG  Tablet, Oral) Active. Metoprolol Succinate ER (100MG  Tablet ER 24HR, Oral) Active. Oxybutynin Chloride ER (10MG  Tablet ER 24HR, Oral) Active. Pantoprazole Sodium (40MG  Tablet DR, Oral) Active. Potassium Chloride ER (10MEQ Tablet ER, Oral) Active. Spironolactone (25MG  Tablet, Oral) Active. ProAir HFA (108 (90 Base)MCG/ACT Aerosol Soln, Inhalation) Active. Symbicort (160-4.5MCG/ACT Aerosol, Inhalation) Active. Calcium Carbonate (1250MG  Tablet Chewable, Oral) Active. CeleXA (40MG  Tablet, Oral) Active. Vitamin B12 TR (1000MCG Tablet ER, Oral) Active. DuoNeb (0.5-2.5 (3)MG/3ML Solution, Inhalation) Active. Probiotic (Oral) Active.  Social History [name], LPN; 6/96/7893 8:10 PM) Alcohol use Occasional alcohol use. No drug use Tobacco use Current some day smoker.  Family History [name], LPN; 1/75/1025 8:52 PM) Cerebrovascular Accident Brother, Father. Diabetes Mellitus Brother, Son. Heart Disease Brother, Father, Mother, Son. Hypertension Son. Prostate Cancer Brother.  Pregnancy / Birth History [name], LPN; 7/78/2423 5:36 PM) Age at menarche 33 years. Age of menopause <47 Gravida 3 Maternal age 53-20 Para 3     Review of Systems ([name] LPN; 1/44/3154 0:08 PM) General Not Present- Appetite Loss, Chills, Fatigue, Fever, Night Sweats, Weight Gain and Weight  Loss. Skin Present- Non-Healing Wounds. Not Present- Change in Wart/Mole, Dryness, Hives, Jaundice, New Lesions, Rash and Ulcer. HEENT Not Present- Earache, Hearing Loss, Hoarseness, Nose Bleed, Oral Ulcers, Ringing in the Ears, Seasonal Allergies, Sinus Pain, Sore Throat, Visual Disturbances, Wears  glasses/contact lenses and Yellow Eyes. Respiratory Not Present- Bloody sputum, Chronic Cough, Difficulty Breathing, Snoring and Wheezing. Breast Not Present- Breast Mass, Breast Pain, Nipple Discharge and Skin Changes. Cardiovascular Not Present- Chest Pain, Difficulty Breathing Lying Down, Leg Cramps, Palpitations, Rapid Heart Rate, Shortness of Breath and Swelling of Extremities. Gastrointestinal Not Present- Abdominal Pain, Bloating, Bloody Stool, Change in Bowel Habits, Chronic diarrhea, Constipation, Difficulty Swallowing, Excessive gas, Gets full quickly at meals, Hemorrhoids, Indigestion, Nausea, Rectal Pain and Vomiting. Female Genitourinary Present- Frequency. Not Present- Nocturia, Painful Urination, Pelvic Pain and Urgency. Musculoskeletal Not Present- Back Pain, Joint Pain, Joint Stiffness, Muscle Pain, Muscle Weakness and Swelling of Extremities. Neurological Not Present- Decreased Memory, Fainting, Headaches, Numbness, Seizures, Tingling, Tremor, Trouble walking and Weakness. Psychiatric Not Present- Anxiety, Bipolar, Change in Sleep Pattern, Depression, Fearful and Frequent crying. Endocrine Not Present- Cold Intolerance, Excessive Hunger, Hair Changes, Heat Intolerance, Hot flashes and New Diabetes. Hematology Not Present- Easy Bruising, Excessive bleeding, Gland problems, HIV and Persistent Infections.  Vitals ([name] LPN; 8/46/9629 5:28 PM) 01/07/2015 3:52 PM Weight: 246 lb Height: 66in Body Surface Area: 2.28 m Body Mass Index: 39.71 kg/m Temp.: 97.2F(Oral)  Pulse: 82 (Regular)  BP: 120/82 (Sitting, Left Arm, Standard)     Physical Exam [name] M. Bradshaw Minihan  MD; 01/07/2015 5:02 PM)  General Mental Status-Alert. General Appearance-Consistent with stated age. Hydration-Well hydrated. Voice-Normal.  Integumentary Note: she has pen markings on her LUE but no cellulitis. she has area of induration, cellulitis, and mild tenderness in Left axilla - 6cm long by 3cm wide   Head and Neck Head-normocephalic, atraumatic with no lesions or palpable masses. Trachea-midline. Thyroid Gland Characteristics - normal size and consistency.  Eye Eyeball - Bilateral-Extraocular movements intact. Sclera/Conjunctiva - Bilateral-No scleral icterus.  Chest and Lung Exam Chest and lung exam reveals -quiet, even and easy respiratory effort with no use of accessory muscles and on auscultation, normal breath sounds, no adventitious sounds and normal vocal resonance. Inspection Chest Wall - Normal. Back - normal.  Breast - Did not examine.  Cardiovascular Cardiovascular examination reveals -normal pedal pulses bilaterally. Note:irregular, irregular  Abdomen Inspection Inspection of the abdomen reveals - No Hernias. Skin - Scar - no surgical scars. Palpation/Percussion Palpation and Percussion of the abdomen reveal - Soft, Non Tender, No Rebound tenderness, No Rigidity (guarding) and No hepatosplenomegaly. Auscultation Auscultation of the abdomen reveals - Bowel sounds normal.  Peripheral Vascular Upper Extremity Palpation - Pulses bilaterally normal.  Neurologic Neurologic evaluation reveals -alert and oriented x 3 with no impairment of recent or remote memory. Mental Status-Normal.  Neuropsychiatric The patient's mood and affect are described as -normal. Judgment and Insight-insight is appropriate concerning matters relevant to self.  Musculoskeletal Normal Exam - Left-Upper Extremity Strength Normal and Lower Extremity Strength Normal. Normal Exam - Right-Upper Extremity Strength Normal and Lower Extremity  Strength Normal.  Lymphatic Head & Neck  General Head & Neck Lymphatics: Bilateral - Description - Normal. Axillary - Did not examine. Femoral & Inguinal - Did not examine.    Assessment & Plan [name] M. Kue Fox MD; 01/07/2015 5:10 PM)  ABSCESS OF AXILLA, LEFT (682.3  L02.412) Impression: This abscess is too large to drain in our urgent office. Moreover she is on an oral anticoagulant which prohibits [place] from trying to lance it in the office. I believe she will need surgical drainage in the operating room in a controlled setting. I have advised the patient to get to [facility] long emergency room to be evaluated and admitted by the  medicine team given her multiple other medical problems and we will follow in consultation. Dr Zella Richer is aware of the patient and he is our surgical hospitalist this week during the daytime. Dr Marlou Starks our call person for this evening will also be aware. She also needs IV abx.  Current Plans Instructions: your abscess is too large to drain in the office especially with you on blood thinner. go to [facility] emergency room to get admitted to the medicine service. ATRIAL FIBRILLATION, CONTROLLED (427.31  I48.91)  CHRONIC OBSTRUCTIVE PULMONARY DISEASE, UNSPECIFIED COPD TYPE (496  J44.9)  TYPE 2 DIABETES MELLITUS WITH UNSPECIFIED COMPLICATIONS (098.11  B14.7)  OBSTRUCTIVE SLEEP APNEA SYNDROME (327.23  G47.33)  ON CONTINUOUS ORAL ANTICOAGULATION (V58.61  Z79.01)  ESSENTIAL HYPERTENSION (401.9  I10)  Leighton Ruff. Redmond Pulling, MD, FACS General, Bariatric, & Minimally Invasive Surgery [facility], [place]

## 2015-01-07 NOTE — Consult Note (Signed)
Brandi Hunter 01/07/2015 3:51 PM Location: [facility] Patient #: [id number] DOB: Jan 03, 1941 Widowed / Language: Brandi Hunter / Race: White Female History of Present Illness [name] M. [name] MD; 01/07/2015 5:07 PM) Patient words: abscess left axilla.  The patient is a 74 year old female who presents with a subcutaneous abscess. She is referred by [name], PA for evaluation of left axillary abscess. The patient has diabetes mellitus type 2, obstructive sleep apnea on CPAP, hypertension, atrial fibrillation on chronic anticoagulation he developed some cellulitis of her left upper extremity last week. She presented to [facility] Emergency Room on Thursday and Sat and was given IV abx (vanc) for her LUE cellulitis. She also developed a abscess in her axilla and a small I&D was done by the EDP without any results. She saw her PCP office today and while her LUE cellulitis she was found to have significant L axillary abscess and was sent to our office for possible I&D. She is on Eliquis for her afib. Her last dose was last night. she has been on clindamycin. She does smoke occasionally. Other Problems Brandi Curry, MD; 01/07/2015 5:11 PM) Bladder Problems Gastroesophageal Reflux Disease Home Oxygen Use Hypercholesterolemia Oophorectomy Bilateral. Pancreatitis Sleep Apnea Chronic Obstructive Lung Disease Diabetes Mellitus Atrial Fibrillation High blood pressure ABSCESS OF AXILLA, LEFT (682.3  L02.412) ON CONTINUOUS ORAL ANTICOAGULATION (V58.61  Z79.01)  Past Surgical History (Brandi Eversole, LPN; 9/79/8921 1:94 PM) Appendectomy Cataract Surgery Bilateral. Hysterectomy (not due to cancer) - Complete Knee Surgery Bilateral. Tonsillectomy  Diagnostic Studies History (Brandi Eversole, LPN; 1/74/0814 4:81 PM) Colonoscopy 1-5 years ago Mammogram 1-3 years ago Pap Smear 1-5 years ago  Allergies (Brandi Eversole, LPN; 8/56/3149 7:02 PM) Latex Exam Gloves  *MEDICAL DEVICES AND SUPPLIES*  Medication History (Brandi Eversole, LPN; 6/37/8588 5:02 PM) Hydrocodone-Acetaminophen (5-325MG  Tablet, Oral) Active. LORazepam (0.5MG  Tablet, Oral) Active. AmLODIPine Besylate (10MG  Tablet, Oral) Active. Clindamycin HCl (150MG  Capsule, Oral) Active. Atorvastatin Calcium (40MG  Tablet, Oral) Active. Eliquis (5MG  Tablet, Oral) Active. Fenofibrate (160MG  Tablet, Oral) Active. Furosemide (20MG  Tablet, Oral) Active. GlipiZIDE ER (10MG  Tablet ER 24HR, Oral) Active. Losartan Potassium (100MG  Tablet, Oral) Active. MetFORMIN HCl (1000MG  Tablet, Oral) Active. Metoprolol Succinate ER (100MG  Tablet ER 24HR, Oral) Active. Oxybutynin Chloride ER (10MG  Tablet ER 24HR, Oral) Active. Pantoprazole Sodium (40MG  Tablet DR, Oral) Active. Potassium Chloride ER (10MEQ Tablet ER, Oral) Active. Spironolactone (25MG  Tablet, Oral) Active. ProAir HFA (108 (90 Base)MCG/ACT Aerosol Soln, Inhalation) Active. Symbicort (160-4.5MCG/ACT Aerosol, Inhalation) Active. Calcium Carbonate (1250MG  Tablet Chewable, Oral) Active. CeleXA (40MG  Tablet, Oral) Active. Vitamin B12 TR (1000MCG Tablet ER, Oral) Active. DuoNeb (0.5-2.5 (3)MG/3ML Solution, Inhalation) Active. Probiotic (Oral) Active.  Social History [name], LPN; 7/74/1287 8:67 PM) Alcohol use Occasional alcohol use. No drug use Tobacco use Current some day smoker.  Family History [name], LPN; 6/72/0947 0:96 PM) Cerebrovascular Accident Brother, Father. Diabetes Mellitus Brother, Son. Heart Disease Brother, Father, Mother, Son. Hypertension Son. Prostate Cancer Brother.  Pregnancy / Birth History [name], LPN; 2/83/6629 4:76 PM) Age at menarche 8 years. Age of menopause <47 Gravida 3 Maternal age 36-20 Para 3     Review of Systems (Brandi Eversole LPN; 5/46/5035 4:65 PM) General Not Present- Appetite Loss, Chills, Fatigue, Fever, Night Sweats, Weight Gain and  Weight Loss. Skin Present- Non-Healing Wounds. Not Present- Change in Wart/Mole, Dryness, Hives, Jaundice, New Lesions, Rash and Ulcer. HEENT Not Present- Earache, Hearing Loss, Hoarseness, Nose Bleed, Oral Ulcers, Ringing in the Ears, Seasonal Allergies, Sinus Pain, Sore Throat, Visual Disturbances, Wears  glasses/contact lenses and Yellow Eyes. Respiratory Not Present- Bloody sputum, Chronic Cough, Difficulty Breathing, Snoring and Wheezing. Breast Not Present- Breast Mass, Breast Pain, Nipple Discharge and Skin Changes. Cardiovascular Not Present- Chest Pain, Difficulty Breathing Lying Down, Leg Cramps, Palpitations, Rapid Heart Rate, Shortness of Breath and Swelling of Extremities. Gastrointestinal Not Present- Abdominal Pain, Bloating, Bloody Stool, Change in Bowel Habits, Chronic diarrhea, Constipation, Difficulty Swallowing, Excessive gas, Gets full quickly at meals, Hemorrhoids, Indigestion, Nausea, Rectal Pain and Vomiting. Female Genitourinary Present- Frequency. Not Present- Nocturia, Painful Urination, Pelvic Pain and Urgency. Musculoskeletal Not Present- Back Pain, Joint Pain, Joint Stiffness, Muscle Pain, Muscle Weakness and Swelling of Extremities. Neurological Not Present- Decreased Memory, Fainting, Headaches, Numbness, Seizures, Tingling, Tremor, Trouble walking and Weakness. Psychiatric Not Present- Anxiety, Bipolar, Change in Sleep Pattern, Depression, Fearful and Frequent crying. Endocrine Not Present- Cold Intolerance, Excessive Hunger, Hair Changes, Heat Intolerance, Hot flashes and New Diabetes. Hematology Not Present- Easy Bruising, Excessive bleeding, Gland problems, HIV and Persistent Infections.  Vitals (Brandi Eversole LPN; 9/62/8366 2:94 PM) 01/07/2015 3:52 PM Weight: 246 lb Height: 66in Body Surface Area: 2.28 m Body Mass Index: 39.71 kg/m Temp.: 97.92F(Oral)  Pulse: 82 (Regular)  BP: 120/82 (Sitting, Left Arm, Standard)     Physical Exam [name] M.  Seneca Hoback MD; 01/07/2015 5:02 PM)  General Mental Status-Alert. General Appearance-Consistent with stated age. Hydration-Well hydrated. Voice-Normal.  Integumentary Note: she has pen markings on her LUE but no cellulitis. she has area of induration, cellulitis, and mild tenderness in Left axilla - 6cm long by 3cm wide   Head and Neck Head-normocephalic, atraumatic with no lesions or palpable masses. Trachea-midline. Thyroid Gland Characteristics - normal size and consistency.  Eye Eyeball - Bilateral-Extraocular movements intact. Sclera/Conjunctiva - Bilateral-No scleral icterus.  Chest and Lung Exam Chest and lung exam reveals -quiet, even and easy respiratory effort with no use of accessory muscles and on auscultation, normal breath sounds, no adventitious sounds and normal vocal resonance. Inspection Chest Wall - Normal. Back - normal.  Breast - Did not examine.  Cardiovascular Cardiovascular examination reveals -normal pedal pulses bilaterally. Note:irregular, irregular  Abdomen Inspection Inspection of the abdomen reveals - No Hernias. Skin - Scar - no surgical scars. Palpation/Percussion Palpation and Percussion of the abdomen reveal - Soft, Non Tender, No Rebound tenderness, No Rigidity (guarding) and No hepatosplenomegaly. Auscultation Auscultation of the abdomen reveals - Bowel sounds normal.  Peripheral Vascular Upper Extremity Palpation - Pulses bilaterally normal.  Neurologic Neurologic evaluation reveals -alert and oriented x 3 with no impairment of recent or remote memory. Mental Status-Normal.  Neuropsychiatric The patient's mood and affect are described as -normal. Judgment and Insight-insight is appropriate concerning matters relevant to self.  Musculoskeletal Normal Exam - Left-Upper Extremity Strength Normal and Lower Extremity Strength Normal. Normal Exam - Right-Upper Extremity Strength Normal and Lower Extremity  Strength Normal.  Lymphatic Head & Neck  General Head & Neck Lymphatics: Bilateral - Description - Normal. Axillary - Did not examine. Femoral & Inguinal - Did not examine.    Assessment & Plan [name] M. Aleaya Latona MD; 01/07/2015 5:10 PM)  ABSCESS OF AXILLA, LEFT (682.3  L02.412) Impression: This abscess is too large to drain in our urgent office. Moreover she is on an oral anticoagulant which prohibits [place] from trying to lance it in the office. I believe she will need surgical drainage in the operating room in a controlled setting. I have advised the patient to get to [facility] long emergency room to be evaluated and admitted by the  medicine team given her multiple other medical problems and we will follow in consultation. Dr Zella Richer is aware of the patient and he is our surgical hospitalist this week during the daytime. Dr Marlou Starks our call person for this evening will also be aware. She also needs IV abx.  Current Plans Instructions: your abscess is too large to drain in the office especially with you on blood thinner. go to [facility] emergency room to get admitted to the medicine service. ATRIAL FIBRILLATION, CONTROLLED (427.31  I48.91)  CHRONIC OBSTRUCTIVE PULMONARY DISEASE, UNSPECIFIED COPD TYPE (496  J44.9)  TYPE 2 DIABETES MELLITUS WITH UNSPECIFIED COMPLICATIONS (449.20  F00.7)  OBSTRUCTIVE SLEEP APNEA SYNDROME (327.23  G47.33)  ON CONTINUOUS ORAL ANTICOAGULATION (V58.61  Z79.01)  ESSENTIAL HYPERTENSION (401.9  I10)  Leighton Ruff. Redmond Pulling, MD, FACS General, Bariatric, & Minimally Invasive Surgery [facility] Surgery, [place]

## 2015-01-08 ENCOUNTER — Encounter (HOSPITAL_COMMUNITY): Payer: Self-pay | Admitting: *Deleted

## 2015-01-08 DIAGNOSIS — I10 Essential (primary) hypertension: Secondary | ICD-10-CM

## 2015-01-08 DIAGNOSIS — I480 Paroxysmal atrial fibrillation: Secondary | ICD-10-CM

## 2015-01-08 DIAGNOSIS — L02412 Cutaneous abscess of left axilla: Principal | ICD-10-CM

## 2015-01-08 DIAGNOSIS — J449 Chronic obstructive pulmonary disease, unspecified: Secondary | ICD-10-CM

## 2015-01-08 DIAGNOSIS — L03112 Cellulitis of left axilla: Secondary | ICD-10-CM | POA: Insufficient documentation

## 2015-01-08 LAB — GLUCOSE, CAPILLARY
Glucose-Capillary: 139 mg/dL — ABNORMAL HIGH (ref 70–99)
Glucose-Capillary: 281 mg/dL — ABNORMAL HIGH (ref 70–99)
Glucose-Capillary: 71 mg/dL (ref 70–99)
Glucose-Capillary: 198 mg/dL — ABNORMAL HIGH (ref 70–99)

## 2015-01-08 LAB — PROTIME-INR
INR: 1.13 (ref 0.00–1.49)
PROTHROMBIN TIME: 14.6 s (ref 11.6–15.2)

## 2015-01-08 LAB — APTT: aPTT: 27 seconds (ref 24–37)

## 2015-01-08 MED ORDER — FUROSEMIDE 20 MG PO TABS
20.0000 mg | ORAL_TABLET | ORAL | Status: DC
Start: 2015-01-09 — End: 2015-01-10
  Administered 2015-01-09: 20 mg via ORAL
  Filled 2015-01-08: qty 1

## 2015-01-08 MED ORDER — BUDESONIDE-FORMOTEROL FUMARATE 160-4.5 MCG/ACT IN AERO
2.0000 | INHALATION_SPRAY | Freq: Two times a day (BID) | RESPIRATORY_TRACT | Status: DC
  Administered 2015-01-08 – 2015-01-10 (×5): 2 via RESPIRATORY_TRACT
  Filled 2015-01-08: qty 6

## 2015-01-08 MED ORDER — ALBUTEROL SULFATE HFA 108 (90 BASE) MCG/ACT IN AERS: 2.0000 | INHALATION_SPRAY | RESPIRATORY_TRACT | Status: DC | PRN

## 2015-01-08 MED ORDER — APIXABAN 5 MG PO TABS
5.0000 mg | ORAL_TABLET | Freq: Two times a day (BID) | ORAL | Status: DC
  Filled 2015-01-08 (×2): qty 1

## 2015-01-08 MED ORDER — CLINDAMYCIN PHOSPHATE 600 MG/50ML IV SOLN
600.0000 mg | Freq: Four times a day (QID) | INTRAVENOUS | Status: DC
  Administered 2015-01-08 – 2015-01-10 (×8): 600 mg via INTRAVENOUS
  Filled 2015-01-08 (×10): qty 50

## 2015-01-08 MED ORDER — IPRATROPIUM-ALBUTEROL 0.5-2.5 (3) MG/3ML IN SOLN
3.0000 mL | Freq: Two times a day (BID) | RESPIRATORY_TRACT | Status: DC
  Administered 2015-01-08 – 2015-01-10 (×5): 3 mL via RESPIRATORY_TRACT
  Filled 2015-01-08 (×5): qty 3

## 2015-01-08 MED ORDER — OXYBUTYNIN CHLORIDE ER 10 MG PO TB24
10.0000 mg | ORAL_TABLET | Freq: Every day | ORAL | Status: DC
  Administered 2015-01-08 – 2015-01-10 (×3): 10 mg via ORAL
  Filled 2015-01-08 (×3): qty 1

## 2015-01-08 MED ORDER — LORAZEPAM 0.5 MG PO TABS: 0.5000 mg | ORAL_TABLET | Freq: Four times a day (QID) | ORAL | Status: DC | PRN

## 2015-01-08 MED ORDER — CITALOPRAM HYDROBROMIDE 40 MG PO TABS
40.0000 mg | ORAL_TABLET | Freq: Every day | ORAL | Status: DC
  Administered 2015-01-08 – 2015-01-09 (×2): 40 mg via ORAL
  Filled 2015-01-08 (×5): qty 1

## 2015-01-08 MED ORDER — ATORVASTATIN CALCIUM 40 MG PO TABS
40.0000 mg | ORAL_TABLET | Freq: Every day | ORAL | Status: DC
  Administered 2015-01-08 – 2015-01-09 (×2): 40 mg via ORAL
  Filled 2015-01-08 (×3): qty 1

## 2015-01-08 MED ORDER — CLINDAMYCIN PHOSPHATE 300 MG/50ML IV SOLN
300.0000 mg | Freq: Two times a day (BID) | INTRAVENOUS | Status: DC
  Filled 2015-01-08: qty 50

## 2015-01-08 MED ORDER — VANCOMYCIN HCL IN DEXTROSE 750-5 MG/150ML-% IV SOLN
750.0000 mg | Freq: Two times a day (BID) | INTRAVENOUS | Status: DC
  Administered 2015-01-08 – 2015-01-10 (×5): 750 mg via INTRAVENOUS
  Filled 2015-01-08 (×5): qty 150

## 2015-01-08 MED ORDER — POTASSIUM CHLORIDE CRYS ER 10 MEQ PO TBCR
10.0000 meq | EXTENDED_RELEASE_TABLET | Freq: Every day | ORAL | Status: DC
  Administered 2015-01-08 – 2015-01-10 (×3): 10 meq via ORAL
  Filled 2015-01-08 (×3): qty 1

## 2015-01-08 MED ORDER — INSULIN ASPART 100 UNIT/ML ~~LOC~~ SOLN
0.0000 [IU] | Freq: Three times a day (TID) | SUBCUTANEOUS | Status: DC
Start: 2015-01-08 — End: 2015-01-10
  Administered 2015-01-08: 2 [IU] via SUBCUTANEOUS
  Administered 2015-01-08: 8 [IU] via SUBCUTANEOUS
  Administered 2015-01-09 – 2015-01-10 (×2): 2 [IU] via SUBCUTANEOUS

## 2015-01-08 MED ORDER — SPIRONOLACTONE 12.5 MG HALF TABLET
12.5000 mg | ORAL_TABLET | Freq: Every day | ORAL | Status: DC
  Administered 2015-01-08 – 2015-01-10 (×3): 12.5 mg via ORAL
  Filled 2015-01-08 (×3): qty 1

## 2015-01-08 MED ORDER — ALBUTEROL SULFATE (2.5 MG/3ML) 0.083% IN NEBU: 2.5000 mg | INHALATION_SOLUTION | RESPIRATORY_TRACT | Status: DC | PRN

## 2015-01-08 MED ORDER — ACETAMINOPHEN 325 MG PO TABS: 650.0000 mg | ORAL_TABLET | Freq: Four times a day (QID) | ORAL | Status: DC | PRN

## 2015-01-08 MED ORDER — METOPROLOL SUCCINATE ER 100 MG PO TB24
100.0000 mg | ORAL_TABLET | Freq: Every day | ORAL | Status: DC
  Administered 2015-01-08 – 2015-01-10 (×3): 100 mg via ORAL
  Filled 2015-01-08 (×3): qty 1

## 2015-01-08 MED ORDER — PANTOPRAZOLE SODIUM 40 MG PO TBEC
40.0000 mg | DELAYED_RELEASE_TABLET | Freq: Every day | ORAL | Status: DC
  Administered 2015-01-08 – 2015-01-10 (×3): 40 mg via ORAL
  Filled 2015-01-08 (×4): qty 1

## 2015-01-08 MED ORDER — LOSARTAN POTASSIUM 50 MG PO TABS
100.0000 mg | ORAL_TABLET | Freq: Every evening | ORAL | Status: DC
  Administered 2015-01-08 – 2015-01-09 (×2): 100 mg via ORAL
  Filled 2015-01-08 (×3): qty 2

## 2015-01-08 MED ORDER — AMLODIPINE BESYLATE 10 MG PO TABS
10.0000 mg | ORAL_TABLET | Freq: Every day | ORAL | Status: DC
  Administered 2015-01-08 – 2015-01-10 (×3): 10 mg via ORAL
  Filled 2015-01-08 (×4): qty 1

## 2015-01-08 MED ORDER — ACETAMINOPHEN 650 MG RE SUPP: 650.0000 mg | Freq: Four times a day (QID) | RECTAL | Status: DC | PRN

## 2015-01-08 MED ORDER — HYDROCODONE-ACETAMINOPHEN 5-325 MG PO TABS: 1.0000 | ORAL_TABLET | Freq: Four times a day (QID) | ORAL | Status: DC | PRN

## 2015-01-08 MED ORDER — GLIPIZIDE ER 10 MG PO TB24
10.0000 mg | ORAL_TABLET | Freq: Two times a day (BID) | ORAL | Status: DC
  Administered 2015-01-08 – 2015-01-10 (×4): 10 mg via ORAL
  Filled 2015-01-08 (×8): qty 1

## 2015-01-08 MED ORDER — FENOFIBRATE 160 MG PO TABS
160.0000 mg | ORAL_TABLET | Freq: Every evening | ORAL | Status: DC
  Administered 2015-01-08 – 2015-01-09 (×2): 160 mg via ORAL
  Filled 2015-01-08 (×3): qty 1

## 2015-01-08 MED ORDER — FLORA-Q PO CAPS
1.0000 | ORAL_CAPSULE | Freq: Every day | ORAL | Status: DC
  Administered 2015-01-08 – 2015-01-10 (×3): 1 via ORAL
  Filled 2015-01-08 (×3): qty 1

## 2015-01-08 NOTE — H&P (Signed)
[facility] History and Physical  Brandi Hunter IFO:[id number] DOB: [birth year] DOA: 01/07/2015  PCP: [name], FNP  Specialists: Dr. Greer Pickerel, General Surgery  Chief Complaint: Left axilla abscess, failed outpatient management.  HPI: Brandi Hunter is a 74 y.o. female who came to [facility] ed 01/07/15, upon the recommendation of her general surgeon, for inpatient management of a refractory left axilla abscess.  The patient is on chronic anticoagulation with Eliquis for a history of atrial fibrillation.  She has a PPM.  Symptoms began one week ago, with left axilla pain and itching.  She could feel two knot in her left axilla.  She says they were never blisters.  She was seen in the ED in [place] on 01/04/15, where she received a dose of IV vancomycin and oral bactrim, and she was discharged on oral clindamycin.  She had a low grade fever to 100.4 at that time.  I and D was attempted at that time.  She followed up two days later as recommended.  She received another dose of IV vancomycin.  When it was recommended that she would need to see a surgeon for I and D, she decided to see her doctor in [place].  Because she is on chronic anticoagulation, she was referred for admission, to be taken to the OR because she has a high risk for bleeding complication.  No chills or sweats.  No nausea or vomiting.  No diarrhea.  Review of Systems: 12 systems reviewed and negative except as stated in HPI.  Past Medical History  Diagnosis Date  . Pacemaker     Implanted  by Dr [name] (MDT) 10/06/10  . Mobitz (type) II atrioventricular block   . HTN (hypertension)   . Obesity   . Hyperlipidemia   . DJD (degenerative joint disease)   . Asthma   . COPD (chronic obstructive pulmonary disease)   . Sleep apnea     CPAP SETTING OF 14  . Diabetes mellitus without complication     TYPE 2  . GERD (gastroesophageal reflux disease)   . History of home oxygen therapy     oxygen 2 liters per nasal cannula  at hs  Hx of shingles  Past Surgical History  Procedure Laterality Date  . Insert / replace / remove pacemaker  10/06/10    MDT  implanted by Dr [name]  . Total abdominal hysterectomy  1971  . Cystocele repair    . Ovary surgery      removal  . Repair rectocele    . Knee arthroscopy      both  . Cataract extraction w/phaco  11/02/2011    Procedure: CATARACT EXTRACTION PHACO AND INTRAOCULAR LENS PLACEMENT (IOC);  Surgeon: Williams Che;  Location: [facility]S;  Service: Ophthalmology;  Laterality: Right;  CDE=7.33  . Cataract extraction w/phaco  12/07/2011    Procedure: CATARACT EXTRACTION PHACO AND INTRAOCULAR LENS PLACEMENT (IOC);  Surgeon: Williams Che, MD;  Location: [facility];  Service: Ophthalmology;  Laterality: Left;  CDE 3.61  . Tonsillectomy  AGE 89  . Colonoscopy with propofol N/A 08/09/2014    Procedure: COLONOSCOPY WITH PROPOFOL;  Surgeon: Juanita Craver, MD;  Location: [facility];  Service: Endoscopy;  Laterality: N/A;  . Esophagogastroduodenoscopy (egd) with propofol N/A 08/09/2014    Procedure: ESOPHAGOGASTRODUODENOSCOPY (EGD) WITH PROPOFOL;  Surgeon: Juanita Craver, MD;  Location: [facility];  Service: Endoscopy;  Laterality: N/A;   Social History:  History   Social History Narrative   Patient lives at home  alone.   Caffeine Use: 16oz drink daily  Smokes about 1 pack of cigarettes per week.  She will have a rare glass of wine.  No illicit drug use.  She is a widow.  She has three adult children.  Allergies  Allergen Reactions  . Bee Venom Swelling  . Latex Swelling    LATEX CATHETERS    Family History  Problem Relation Age of Onset  . Diabetes Brother   . Heart disease Mother     CHF  . Heart disease Father     CHF  . Arthritis Sister     osteo  . Cancer Brother     prostate  . Anesthesia problems Neg Hx   . Hypotension Neg Hx   . Malignant hyperthermia Neg Hx   . Pseudochol deficiency Neg Hx     Prior to Admission medications   Medication Sig Start  Date End Date Taking? Authorizing Provider  albuterol (PROAIR HFA) 108 (90 BASE) MCG/ACT inhaler Inhale 2 puffs into the lungs every 4 (four) hours as needed for wheezing. Inhale 2 puffs into the lungs every 4 (four) hours as needed. 07/21/12  Yes Historical Provider, MD  amLODipine (NORVASC) 10 MG tablet TAKE ONE TABLET BY MOUTH ONCE DAILY 10/25/14  Yes Thompson Grayer, MD  apixaban (ELIQUIS) 5 MG TABS tablet Take 1 tablet (5 mg total) by mouth 2 (two) times daily. 05/24/14  Yes Thompson Grayer, MD  atorvastatin (LIPITOR) 40 MG tablet Take 40 mg by mouth daily.  03/17/13  Yes Historical Provider, MD  budesonide-formoterol (SYMBICORT) 160-4.5 MCG/ACT inhaler Inhale 2 puffs into the lungs 2 (two) times daily.   Yes Historical Provider, MD  Calcium Carbonate-Vitamin D (RA CALCIUM PLUS VITAMIN D) 600-400 MG-UNIT per tablet Take 2 tablets by mouth daily.     Yes Historical Provider, MD  citalopram (CELEXA) 40 MG tablet Take 40 mg by mouth at bedtime.    Yes Historical Provider, MD  clindamycin (CLEOCIN) 150 MG capsule Take 150 mg by mouth 3 (three) times daily. 01/04/15  Yes Historical Provider, MD  Cyanocobalamin (VITAMIN B-12) 2000 MCG TBCR Take 1 tablet by mouth 2 (two) times daily. 07/21/12  Yes Historical Provider, MD  fenofibrate 160 MG tablet Take 160 mg by mouth every evening.   Yes Historical Provider, MD  furosemide (LASIX) 20 MG tablet Take 20 mg by mouth every Monday, Wednesday, and Friday.    Yes Historical Provider, MD  glipiZIDE (GLUCOTROL XL) 10 MG 24 hr tablet Take 10 mg by mouth 2 (two) times daily.  08/20/13  Yes Historical Provider, MD  HYDROcodone-acetaminophen (NORCO/VICODIN) 5-325 MG per tablet Take 1 tablet by mouth every 6 (six) hours as needed for moderate pain.   Yes Historical Provider, MD  ipratropium-albuterol (DUONEB) 0.5-2.5 (3) MG/3ML SOLN Take 3 mLs by nebulization 2 (two) times daily.   Yes Historical Provider, MD  LORazepam (ATIVAN) 0.5 MG tablet Take 1 tablet by mouth every 6 (six)  hours as needed for anxiety.  04/28/13  Yes Historical Provider, MD  losartan (COZAAR) 100 MG tablet Take 100 mg by mouth every evening.   Yes Historical Provider, MD  metFORMIN (GLUCOPHAGE) 1000 MG tablet Take 1,000 mg by mouth 2 (two) times daily with a meal.   Yes Historical Provider, MD  metoprolol succinate (TOPROL-XL) 100 MG 24 hr tablet TAKE ONE TABLET BY MOUTH ONCE DAILY 10/25/14  Yes Thompson Grayer, MD  oxybutynin (DITROPAN-XL) 10 MG 24 hr tablet Take 10 mg by mouth daily.  07/19/13  Yes Historical Provider, MD  pantoprazole (PROTONIX) 40 MG tablet Take 40 mg by mouth daily.  08/21/13  Yes Historical Provider, MD  potassium chloride (K-DUR,KLOR-CON) 10 MEQ tablet Take 10 mEq by mouth daily.   Yes Historical Provider, MD  Probiotic Product (ALIGN PO) Take 1 capsule by mouth daily.    Yes Historical Provider, MD  spironolactone (ALDACTONE) 25 MG tablet Take 0.5 tablets (12.5 mg total) by mouth daily. 10/27/13  Yes [name], NP   Physical Exam: Filed Vitals:   01/07/15 1757 01/07/15 1910 01/07/15 2315 01/08/15 0009  BP: 136/61  152/50 187/72  Pulse:  77 61 67  Temp: 98.4 F (36.9 C)  97.9 F (36.6 C) 98.1 F (36.7 C)  TempSrc: Oral  Oral Oral  Resp: 17  18 18   Weight:    111.313 kg (245 lb 6.4 oz)  SpO2: 95% 94% 95% 94%     General:  Awake and alert, NAD  Eyes: PERRL  ENT: No nasal drainage, moist mucous membranes  Neck: thick but supple  Cardiovascular: NR/RR, no LE edema  Respiratory: coarse bilaterally  Abdomen: soft NT ND  Skin: large area of erythema/induration to left axilla though superficial erythema previously outlined down her left arm has receded nicely.  No drainage.  It is TTP.  It is warm to touch.  Musculoskeletal: Moves all four extremities spontanesouly  Psychiatric: Normal affect  Neurologic: No focal deficits  Labs on Admission:  Basic Metabolic Panel:  Recent Labs Lab 01/07/15 1938  NA 138  K 4.0  CL 101  CO2 27  GLUCOSE 124*  BUN  18  CREATININE 0.94  CALCIUM 9.5   Liver Function Tests:  Recent Labs Lab 01/07/15 1938  AST 20  ALT 16  ALKPHOS 35*  BILITOT 0.7  PROT 6.6  ALBUMIN 3.5   CBC:  Recent Labs Lab 01/07/15 1938  WBC 7.5  NEUTROABS 4.2  HGB 12.1  HCT 37.8  MCV 89.4  PLT 355   EKG: Ordered for AM  Assessment/Plan Active Problems:   Abscess of axilla, left   1.  Left axilla abscess without evidence of sepsis --Admit to inpatient --IV vancomycin and clindamycin --General surgery consult pending.  Dr. Redmond Pulling has a note in the EMR.  His partner should see in AM.  Continue Eliquis for now (dose held until 10AM) while awaiting OR plans. --Coags ordered.  2. HTN --Continue home medications  3.  DM --Hold metformin, continue glipizide for now.  Accuchecks and SSI coverage.    Code Status: FULL Disposition Plan: To be determined based on OR plans.  Time spent: 60 minutes  Eber Jones  01/08/2015, 1:04 AM

## 2015-01-08 NOTE — Progress Notes (Signed)
PROGRESS NOTE  Brandi Hunter LOV:[id number] DOB: [birth year] DOA: 01/07/2015 PCP: [name], FNP  HPI: Brandi Hunter is a 74 y.o. female who came to [facility] ed 01/07/15, upon the recommendation of her general surgeon, for inpatient management of a refractory left axilla abscess  Subjective / 24 H Interval events - no complaints this morning, no chest pain/dyspnea  Assessment/Plan: Active Problems:   Mobitz type II atrioventricular block   HTN (hypertension)   Obesity   OSA (obstructive sleep apnea)   Chronic asthmatic bronchitis   Paroxysmal atrial fibrillation   Abscess of axilla, left  Left axillary abscess - general surgery following, plan for I&D tomorrow in the OR - she has had few I&Ds recently in the ED in [place] with recurrence - Continue IV antibiotics  Hypertension - continue home medications  Diabetes mellitus - sliding scale insulin  A fib/heart block - has a pacemaker - anticoagulated with Eliquis, now on hold pending oR  OSA - on Cpap  Chronic asthmatic bronchitis - no wheezing, stable   Diet: Diet heart healthy/carb modified Diet NPO time specified Fluids: none  DVT Prophylaxis: on hold   Code Status: Full Code Family Communication: d/w patient  Disposition Plan: inpatient  Consultants:  General Surgery   Procedures:  None    Antibiotics Vancomycin 2/23 >> Clindamycin 2/23 >>   Studies  No results found.  Objective  Filed Vitals:   01/08/15 0009 01/08/15 0205 01/08/15 0500 01/08/15 0830  BP: 187/72 148/49 167/76   Pulse: 67 65 72   Temp: 98.1 F (36.7 C)  98.6 F (37 C)   TempSrc: Oral  Oral   Resp: 18  18   Weight: 111.313 kg (245 lb 6.4 oz)     SpO2: 94% 98% 97% 93%    Intake/Output Summary (Last 24 hours) at 01/08/15 1355 Last data filed at 01/08/15 1036  Gross per 24 hour  Intake    240 ml  Output    600 ml  Net   -360 ml   Filed Weights   01/08/15 0009  Weight: 111.313 kg (245 lb 6.4 oz)     Exam:  General:  NAD  HEENT: no scleral icterus  Cardiovascular: RRR  Respiratory: no wheezing  Abdomen: soft, non tender  MSK/Extremities: fluctuance left axilla with purulent drainage   Skin: no rashes  Neuro: non focal  Data Reviewed: Basic Metabolic Panel:  Recent Labs Lab 01/07/15 1938  NA 138  K 4.0  CL 101  CO2 27  GLUCOSE 124*  BUN 18  CREATININE 0.94  CALCIUM 9.5   Liver Function Tests:  Recent Labs Lab 01/07/15 1938  AST 20  ALT 16  ALKPHOS 35*  BILITOT 0.7  PROT 6.6  ALBUMIN 3.5   CBC:  Recent Labs Lab 01/07/15 1938  WBC 7.5  NEUTROABS 4.2  HGB 12.1  HCT 37.8  MCV 89.4  PLT 355   CBG:  Recent Labs Lab 01/08/15 0747 01/08/15 1200  GLUCAP 139* 281*    Scheduled Meds: . amLODipine  10 mg Oral Daily  . atorvastatin  40 mg Oral q1800  . budesonide-formoterol  2 puff Inhalation BID  . citalopram  40 mg Oral QHS  . clindamycin (CLEOCIN) IV  600 mg Intravenous Q6H  . fenofibrate  160 mg Oral QPM  . FLORA-Q  1 capsule Oral Daily  . [START ON 01/09/2015] furosemide  20 mg Oral Q M,W,F  . glipiZIDE  10 mg Oral BID WC  . insulin aspart  0-15 Units Subcutaneous TID WC  . ipratropium-albuterol  3 mL Nebulization BID  . losartan  100 mg Oral QPM  . metoprolol succinate  100 mg Oral Daily  . oxybutynin  10 mg Oral Daily  . pantoprazole  40 mg Oral Daily  . potassium chloride  10 mEq Oral Daily  . spironolactone  12.5 mg Oral Daily  . vancomycin  750 mg Intravenous Q12H   Continuous Infusions:   Marzetta Board, MD [facility] Pager [phone]. If 7 PM - 7 AM, please contact night-coverage at [email or web address], password [facility] 01/08/2015, 1:55 PM  LOS: 1 day

## 2015-01-08 NOTE — Progress Notes (Signed)
Pt placed on CPAP QHS.  Pt using hospital equipment.  Machine plugged into red outlet, humidifier filled with sterile water, and setting of 13 cm H2O of CPAP are per Pt's home settings.  Pt stable and comfortable.

## 2015-01-08 NOTE — Progress Notes (Signed)
ANTIBIOTIC CONSULT NOTE - INITIAL  Pharmacy Consult for vancomycin Indication: Cellulitis, Left axilla abscess, failed outpatient management.  Allergies  Allergen Reactions  . Bee Venom Swelling  . Latex Swelling    LATEX CATHETERS    Patient Measurements: Weight: 245 lb 6.4 oz (111.313 kg) Adjusted Body Weight:   Vital Signs: Temp: 98.6 F (37 C) (02/23 0500) Temp Source: Oral (02/23 0500) BP: 167/76 mmHg (02/23 0500) Pulse Rate: 72 (02/23 0500) Intake/Output from previous day:   Intake/Output from this shift:    Labs:  Recent Labs  01/07/15 1938  WBC 7.5  HGB 12.1  PLT 355  CREATININE 0.94   Estimated Creatinine Clearance: 69.4 mL/min (by C-G formula based on Cr of 0.94). No results for input(s): VANCOTROUGH, VANCOPEAK, VANCORANDOM, GENTTROUGH, GENTPEAK, GENTRANDOM, TOBRATROUGH, TOBRAPEAK, TOBRARND, AMIKACINPEAK, AMIKACINTROU, AMIKACIN in the last 72 hours.   Microbiology: No results found for this or any previous visit (from the past 720 hour(s)).  Medical History: Past Medical History  Diagnosis Date  . Pacemaker     Implanted  by Dr [name] (MDT) 10/06/10  . Mobitz (type) II atrioventricular block   . HTN (hypertension)   . Obesity   . Hyperlipidemia   . DJD (degenerative joint disease)   . Asthma   . COPD (chronic obstructive pulmonary disease)   . Pancreatitis 2010 OR 2011    pt thought this had been ruled out  . Sleep apnea     CPA SETTING OF 14  . Diabetes mellitus without complication     TYPE 2  . GERD (gastroesophageal reflux disease)   . History of home oxygen therapy     oxygen 2 liters per nasal cannula at hs    Medications:  Anti-infectives    Start     Dose/Rate Route Frequency Ordered Stop   01/08/15 1000  clindamycin (CLEOCIN) IVPB 300 mg    Comments:  Pharmacy please check dosing   300 mg 100 mL/hr over 30 Minutes Intravenous Every 12 hours 01/08/15 0117     01/08/15 0800  vancomycin (VANCOCIN) IVPB 750 mg/150 ml premix     750 mg 150 mL/hr over 60 Minutes Intravenous Every 12 hours 01/08/15 0520     01/07/15 2230  vancomycin (VANCOCIN) IVPB 1000 mg/200 mL premix     1,000 mg 200 mL/hr over 60 Minutes Intravenous  Once 01/07/15 2225 01/07/15 2335     Assessment: Patient with Left axilla abscess, failed outpatient management.  First dose of antibiotics at [facility] already given in ED. Note prior vancomycin x1 doses and recommendation for I&D.  Therefore, will dose vancomycin at high goal.  Goal of Therapy:  Vancomycin trough level 15-20 mcg/ml  Plan:  Measure antibiotic drug levels at steady state Follow up culture results Vancomycin 750mg  iv q12hr  Nani Skillern Crowford 01/08/2015,5:20 AM

## 2015-01-08 NOTE — Progress Notes (Signed)
Per RN Pt has refused CPAP for the night. Pt stated that she would be okay for the night with her O2.  RT to monitor and assess as needed.

## 2015-01-08 NOTE — Progress Notes (Signed)
Subjective: Feeling some better.  She requests that she be a DNR.  I told her I would leave this up to her primary team.  Objective: Vital signs in last 24 hours: Temp:  [97.9 F (36.6 C)-98.6 F (37 C)] 98.6 F (37 C) (02/23 0500) Pulse Rate:  [61-77] 72 (02/23 0500) Resp:  [17-18] 18 (02/23 0500) BP: (136-187)/(49-76) 167/76 mmHg (02/23 0500) SpO2:  [93 %-98 %] 93 % (02/23 0830) Weight:  [245 lb 6.4 oz (111.313 kg)] 245 lb 6.4 oz (111.313 kg) (02/23 0009) Last BM Date: 01/07/15  Intake/Output from previous day:   Intake/Output this shift:    PE: General- In NAD LUE-5-6 cm red, indurated, fluctuant area in left axilla with no drainage  Lab Results:   Recent Labs  01/07/15 1938  WBC 7.5  HGB 12.1  HCT 37.8  PLT 355   BMET  Recent Labs  01/07/15 1938  NA 138  K 4.0  CL 101  CO2 27  GLUCOSE 124*  BUN 18  CREATININE 0.94  CALCIUM 9.5   PT/INR  Recent Labs  01/08/15 0552  LABPROT 14.6  INR 1.13   Comprehensive Metabolic Panel:    Component Value Date/Time   NA 138 01/07/2015 1938   NA 142 07/02/2014 1157   K 4.0 01/07/2015 1938   K 4.1 07/02/2014 1157   CL 101 01/07/2015 1938   CL 105 07/02/2014 1157   CO2 27 01/07/2015 1938   CO2 29 07/02/2014 1157   BUN 18 01/07/2015 1938   BUN 20 07/02/2014 1157   CREATININE 0.94 01/07/2015 1938   CREATININE 1.1 07/02/2014 1157   GLUCOSE 124* 01/07/2015 1938   GLUCOSE 188* 07/02/2014 1157   CALCIUM 9.5 01/07/2015 1938   CALCIUM 9.3 07/02/2014 1157   AST 20 01/07/2015 1938   AST 27 05/04/2010 2140   ALT 16 01/07/2015 1938   ALT 24 05/04/2010 2140   ALKPHOS 35* 01/07/2015 1938   ALKPHOS 54 05/04/2010 2140   BILITOT 0.7 01/07/2015 1938   BILITOT 0.5 05/04/2010 2140   PROT 6.6 01/07/2015 1938   PROT 6.8 05/04/2010 2140   ALBUMIN 3.5 01/07/2015 1938   ALBUMIN 3.7 05/04/2010 2140     Studies/Results: No results found.  Anti-infectives: Anti-infectives    Start     Dose/Rate Route Frequency  Ordered Stop   01/08/15 1000  clindamycin (CLEOCIN) IVPB 300 mg  Status:  Discontinued    Comments:  Pharmacy please check dosing   300 mg 100 mL/hr over 30 Minutes Intravenous Every 12 hours 01/08/15 0117 01/08/15 0844   01/08/15 1000  clindamycin (CLEOCIN) IVPB 600 mg    Comments:  Pharmacy please check dosing   600 mg 100 mL/hr over 30 Minutes Intravenous Every 6 hours 01/08/15 0844     01/08/15 0800  vancomycin (VANCOCIN) IVPB 750 mg/150 ml premix     750 mg 150 mL/hr over 60 Minutes Intravenous Every 12 hours 01/08/15 0520     01/07/15 2230  vancomycin (VANCOCIN) IVPB 1000 mg/200 mL premix     1,000 mg 200 mL/hr over 60 Minutes Intravenous  Once 01/07/15 2225 01/07/15 2335      Assessment Active Problems:   Abscess of axilla, left   Chronic anticoagulation with Eliquis-last dose 2/21 PM; needs to be held 48-72 hours before invasive procedures.    LOS: 1 day   Plan: Incision and Drainage of left axillary abscess tomorrow.  Procedure and risks (including but not limited to bleeding, infection, wound problems, anesthesia, need  for reoperation) discussed with her .  She seems to understand and agrees with the plan.   Omayra Tulloch Lenna Sciara 01/08/2015

## 2015-01-08 NOTE — Progress Notes (Signed)
UR complete 

## 2015-01-09 ENCOUNTER — Inpatient Hospital Stay (HOSPITAL_COMMUNITY): Payer: Medicare Other | Admitting: Anesthesiology

## 2015-01-09 ENCOUNTER — Encounter (HOSPITAL_COMMUNITY)
Admission: EM | Disposition: A | Discharge status: Home / Self Care | Payer: Self-pay | Source: Home / Self Care | Attending: Internal Medicine

## 2015-01-09 ENCOUNTER — Ambulatory Visit: Payer: Medicare Other

## 2015-01-09 ENCOUNTER — Other Ambulatory Visit: Payer: Medicare Other

## 2015-01-09 HISTORY — PX: SIMPLE MASTECTOMY WITH AXILLARY SENTINEL NODE BIOPSY: SHX6098

## 2015-01-09 LAB — GLUCOSE, CAPILLARY
GLUCOSE-CAPILLARY: 172 mg/dL — AB (ref 70–99)
GLUCOSE-CAPILLARY: 165 mg/dL — AB (ref 70–99)
GLUCOSE-CAPILLARY: 143 mg/dL — AB (ref 70–99)
GLUCOSE-CAPILLARY: 159 mg/dL — AB (ref 70–99)
Glucose-Capillary: 112 mg/dL — ABNORMAL HIGH (ref 70–99)

## 2015-01-09 SURGERY — SIMPLE MASTECTOMY
Anesthesia: General | Laterality: Left

## 2015-01-09 MED ORDER — FENTANYL CITRATE 0.05 MG/ML IJ SOLN
INTRAMUSCULAR | Status: AC
  Filled 2015-01-09: qty 2

## 2015-01-09 MED ORDER — FENTANYL CITRATE 0.05 MG/ML IJ SOLN
25.0000 ug | INTRAMUSCULAR | Status: DC | PRN
Start: 2015-01-09 — End: 2015-01-09
  Administered 2015-01-09 (×2): 50 ug via INTRAVENOUS

## 2015-01-09 MED ORDER — LIDOCAINE HCL 1 % IJ SOLN
INTRAMUSCULAR | Status: DC | PRN
  Administered 2015-01-09: 5 mL

## 2015-01-09 MED ORDER — PROMETHAZINE HCL 25 MG/ML IJ SOLN: 6.2500 mg | INTRAMUSCULAR | Status: DC | PRN

## 2015-01-09 MED ORDER — LIDOCAINE HCL (CARDIAC) 20 MG/ML IV SOLN
INTRAVENOUS | Status: AC
  Filled 2015-01-09: qty 5

## 2015-01-09 MED ORDER — LIDOCAINE HCL (CARDIAC) 20 MG/ML IV SOLN
INTRAVENOUS | Status: DC | PRN
  Administered 2015-01-09 (×2): 50 mg via INTRAVENOUS

## 2015-01-09 MED ORDER — MORPHINE SULFATE 2 MG/ML IJ SOLN
2.0000 mg | INTRAMUSCULAR | Status: DC | PRN
  Administered 2015-01-10: 2 mg via INTRAVENOUS
  Filled 2015-01-09: qty 1

## 2015-01-09 MED ORDER — LACTATED RINGERS IV SOLN
INTRAVENOUS | Status: DC
  Administered 2015-01-09: 1000 mL via INTRAVENOUS
  Administered 2015-01-09: 15:00:00 via INTRAVENOUS

## 2015-01-09 MED ORDER — FENTANYL CITRATE 0.05 MG/ML IJ SOLN
INTRAMUSCULAR | Status: AC
  Filled 2015-01-09: qty 5

## 2015-01-09 MED ORDER — PROPOFOL 10 MG/ML IV BOLUS
INTRAVENOUS | Status: AC
  Filled 2015-01-09: qty 20

## 2015-01-09 MED ORDER — LIDOCAINE HCL 1 % IJ SOLN
INTRAMUSCULAR | Status: AC
  Filled 2015-01-09: qty 20

## 2015-01-09 MED ORDER — MEPERIDINE HCL 50 MG/ML IJ SOLN: 6.2500 mg | INTRAMUSCULAR | Status: DC | PRN

## 2015-01-09 MED ORDER — PROPOFOL INFUSION 10 MG/ML OPTIME
INTRAVENOUS | Status: DC | PRN
Start: 2015-01-09 — End: 2015-01-09
  Administered 2015-01-09: 140 ug/kg/min via INTRAVENOUS

## 2015-01-09 SURGICAL SUPPLY — 32 items
APL SKNCLS STERI-STRIP NONHPOA (GAUZE/BANDAGES/DRESSINGS)
APPLIER CLIP 9.375 MED OPEN (MISCELLANEOUS)
APR CLP MED 9.3 20 MLT OPN (MISCELLANEOUS)
BANDAGE ELASTIC 6 VELCRO ST LF (GAUZE/BANDAGES/DRESSINGS) ×3 IMPLANT
BENZOIN TINCTURE PRP APPL 2/3 (GAUZE/BANDAGES/DRESSINGS) ×1 IMPLANT
BINDER BREAST LRG (GAUZE/BANDAGES/DRESSINGS) IMPLANT
BINDER BREAST XLRG (GAUZE/BANDAGES/DRESSINGS) IMPLANT
CHLORAPREP W/TINT 26ML (MISCELLANEOUS) ×1 IMPLANT
CLIP APPLIE 9.375 MED OPEN (MISCELLANEOUS) ×1 IMPLANT
CLOSURE WOUND 1/2 X4 (GAUZE/BANDAGES/DRESSINGS)
COVER SURGICAL LIGHT HANDLE (MISCELLANEOUS) ×3 IMPLANT
DRAIN CHANNEL 19F RND (DRAIN) ×1 IMPLANT
DRAPE LAPAROSCOPIC ABDOMINAL (DRAPES) ×3 IMPLANT
DRAPE UTILITY 15X26 (DRAPE) ×4 IMPLANT
ELECT REM PT RETURN 9FT ADLT (ELECTROSURGICAL) ×3
ELECTRODE REM PT RTRN 9FT ADLT (ELECTROSURGICAL) ×1 IMPLANT
EVACUATOR SILICONE 100CC (DRAIN) ×1 IMPLANT
GAUZE SPONGE 4X4 12PLY STRL (GAUZE/BANDAGES/DRESSINGS) ×3 IMPLANT
GLOVE BIO SURGEON STRL SZ7.5 (GLOVE) ×1 IMPLANT
GLOVE BIOGEL PI IND STRL 8 (GLOVE) ×1 IMPLANT
GLOVE BIOGEL PI INDICATOR 8 (GLOVE) ×2
GLOVE ECLIPSE 8.0 STRL XLNG CF (GLOVE) ×3 IMPLANT
GOWN STRL REUS W/TWL LRG LVL3 (GOWN DISPOSABLE) ×6 IMPLANT
KIT BASIN OR (CUSTOM PROCEDURE TRAY) ×3 IMPLANT
NS IRRIG 1000ML POUR BTL (IV SOLUTION) ×3 IMPLANT
PACK GENERAL/GYN (CUSTOM PROCEDURE TRAY) ×3 IMPLANT
PAD ABD 8X10 STRL (GAUZE/BANDAGES/DRESSINGS) ×2 IMPLANT
STAPLER VISISTAT 35W (STAPLE) ×1 IMPLANT
STRIP CLOSURE SKIN 1/2X4 (GAUZE/BANDAGES/DRESSINGS) ×1 IMPLANT
SUT ETHILON 3 0 PS 1 (SUTURE) ×1 IMPLANT
TAPE CLOTH SURG 4X10 WHT LF (GAUZE/BANDAGES/DRESSINGS) ×2 IMPLANT
TOWEL OR 17X26 10 PK STRL BLUE (TOWEL DISPOSABLE) ×3 IMPLANT

## 2015-01-09 NOTE — Op Note (Signed)
Operative Note  Brandi Hunter female 74 y.o. 01/09/2015  PREOPERATIVE DX:  Left axillary abscess  POSTOPERATIVE DX:  Same  PROCEDURE:   Incision, drainage, and debridement of left axillary abscess         Surgeon: Odis Hollingshead   Assistants: Gwenith Spitz, PA-S  Anesthesia: local (combination of Xylocaine and Marcaine) with sedation  Indications:   This is a 74 year old female who has an enlarging left axillary abscess. She is on Eliquis which has been held for 3 days. She now presents for above procedure.    Procedure Detail:  She was seen in the holding area and the left upper arm marked with my initials. She is brought to the operating room placed supine on the operating table and given intravenous sedation. The left axillary area were sterilely prepped and draped. There are 2 sites were spontaneous drainage had started. I anesthetized the periphery of the abscess using the local anesthetic mixture and made an elliptical full-thickness incision through the skin and subcutaneous tissue removing the skin and subcutaneous tissue of the abscess cavity and draining purulent material. There is some indurated material inferior to this which consisted of indurated subcutaneous tissue and I debrided this with electrocautery. The wound bed  then looked clean. Bleeding was controlled with electrocautery.  The wound was then packed with saline moistened gauze followed by dry bulky dressing. She tolerated the procedure well and a pack complications and was taken to the recovery room in satisfactory condition.

## 2015-01-09 NOTE — Anesthesia Postprocedure Evaluation (Signed)
  Anesthesia Post-op Note  Patient: Brandi Hunter  Procedure(s) Performed: Procedure(s) (LRB): Irrigation and Drainage Abcess left Axilla (Left)  Patient Location: PACU  Anesthesia Type: General  Level of Consciousness: awake and alert   Airway and Oxygen Therapy: Patient Spontanous Breathing  Post-op Pain: mild  Post-op Assessment: Post-op Vital signs reviewed, Patient's Cardiovascular Status Stable, Respiratory Function Stable, Patent Airway and No signs of Nausea or vomiting  Last Vitals:  Filed Vitals:   01/09/15 1506  BP: 137/56  Pulse: 62  Temp: 36.9 C  Resp: 12    Post-op Vital Signs: stable   Complications: No apparent anesthesia complications

## 2015-01-09 NOTE — Procedures (Signed)
PT placed on hospital cpap machine with a setting of 13cmH2O.  This is per the pt's home setting.  Large mask used.  Pt is resting comfortably at this time.  RT will monitor.

## 2015-01-09 NOTE — Progress Notes (Signed)
[facility] Progress Note     Subjective: Pt ready for surgery.  She says the left axillary abscess drained somewhat overnight spontaneously.  Pain is less and notes redness significantly improved since Thursday.  No questions/concerns.   Objective: Vital signs in last 24 hours: Temp:  [98.2 F (36.8 C)-99.4 F (37.4 C)] 98.5 F (36.9 C) (02/24 0536) Pulse Rate:  [63-82] 63 (02/24 0536) Resp:  [16-18] 16 (02/24 0536) BP: (135-154)/(44-66) 139/53 mmHg (02/24 0536) SpO2:  [93 %-98 %] 94 % (02/24 0536) Last BM Date: 01/07/15  Intake/Output from previous day: 02/23 0701 - 02/24 0700 In: 720 [P.O.:720] Out: 3400 [Urine:3400] Intake/Output this shift:    PE: Gen:  Alert, NAD, pleasant Left axilla:  Hard indurated/fluctuant 6cm x 3cm area of the left axilla with tan purulent drainage, quite tender   Lab Results:   Recent Labs  01/07/15 1938  WBC 7.5  HGB 12.1  HCT 37.8  PLT 355   BMET  Recent Labs  01/07/15 1938  NA 138  K 4.0  CL 101  CO2 27  GLUCOSE 124*  BUN 18  CREATININE 0.94  CALCIUM 9.5   PT/INR  Recent Labs  01/08/15 0552  LABPROT 14.6  INR 1.13   CMP     Component Value Date/Time   NA 138 01/07/2015 1938   K 4.0 01/07/2015 1938   CL 101 01/07/2015 1938   CO2 27 01/07/2015 1938   GLUCOSE 124* 01/07/2015 1938   BUN 18 01/07/2015 1938   CREATININE 0.94 01/07/2015 1938   CALCIUM 9.5 01/07/2015 1938   PROT 6.6 01/07/2015 1938   ALBUMIN 3.5 01/07/2015 1938   AST 20 01/07/2015 1938   ALT 16 01/07/2015 1938   ALKPHOS 35* 01/07/2015 1938   BILITOT 0.7 01/07/2015 1938   GFRNONAA 60* 01/07/2015 1938   GFRAA 69* 01/07/2015 1938   Lipase     Component Value Date/Time   LIPASE 1957* 05/04/2010 2338       Studies/Results: No results found.  Anti-infectives: Anti-infectives    Start     Dose/Rate Route Frequency Ordered Stop   01/08/15 1000  clindamycin (CLEOCIN) IVPB 300 mg  Status:  Discontinued    Comments:   Pharmacy please check dosing   300 mg 100 mL/hr over 30 Minutes Intravenous Every 12 hours 01/08/15 0117 01/08/15 0844   01/08/15 1000  clindamycin (CLEOCIN) IVPB 600 mg    Comments:  Pharmacy please check dosing   600 mg 100 mL/hr over 30 Minutes Intravenous Every 6 hours 01/08/15 0844     01/08/15 0800  vancomycin (VANCOCIN) IVPB 750 mg/150 ml premix     750 mg 150 mL/hr over 60 Minutes Intravenous Every 12 hours 01/08/15 0520     01/07/15 2230  vancomycin (VANCOCIN) IVPB 1000 mg/200 mL premix     1,000 mg 200 mL/hr over 60 Minutes Intravenous  Once 01/07/15 2225 01/07/15 2335       Assessment/Plan Abscess of axilla, left -Incision and drainage of left axillary abscess today -Ambulate and IS -SCD's and vte proph on hold for surgery -NPO for surgery, IVF, pain control Chronic anticoagulation with Eliquis-last dose 2/21 PM; needs to be held 48-72 hours before invasive procedures.    LOS: 2 days    [name] 01/09/2015, 7:40 AM Pager: [phone]

## 2015-01-09 NOTE — Anesthesia Preprocedure Evaluation (Addendum)
Anesthesia Evaluation  Patient identified by MRN, date of birth, ID band Patient awake    Reviewed: Allergy & Precautions, H&P , NPO status , Patient's Chart, lab work & pertinent test results  Airway Mallampati: II  TM Distance: >3 FB Neck ROM: Full    Dental no notable dental hx. (+) Edentulous Upper, Edentulous Lower, Lower Dentures, Upper Dentures   Pulmonary asthma , sleep apnea and Continuous Positive Airway Pressure Ventilation , COPDCurrent Smoker, former smoker,  breath sounds clear to auscultation  Pulmonary exam normal       Cardiovascular hypertension, Pt. on medications + pacemaker Rhythm:Regular Rate:Normal     Neuro/Psych negative neurological ROS  negative psych ROS   GI/Hepatic negative GI ROS, Neg liver ROS,   Endo/Other  diabetes, Type 2, Oral Hypoglycemic Agents  Renal/GU negative Renal ROS  negative genitourinary   Musculoskeletal negative musculoskeletal ROS (+)   Abdominal   Peds negative pediatric ROS (+)  Hematology negative hematology ROS (+)   Anesthesia Other Findings   Reproductive/Obstetrics negative OB ROS                             Anesthesia Physical  Anesthesia Plan  ASA: III  Anesthesia Plan: General   Post-op Pain Management:    Induction: Intravenous  Airway Management Planned: Oral ETT and LMA  Additional Equipment:   Intra-op Plan:   Post-operative Plan: Extubation in OR  Informed Consent: I have reviewed the patients History and Physical, chart, labs and discussed the procedure including the risks, benefits and alternatives for the proposed anesthesia with the patient or authorized representative who has indicated his/her understanding and acceptance.   Dental advisory given  Plan Discussed with: CRNA  Anesthesia Plan Comments:       Anesthesia Quick Evaluation

## 2015-01-09 NOTE — Progress Notes (Signed)
PROGRESS NOTE  Brandi Hunter BSJ:[id number] DOB: [birth year] DOA: 01/07/2015 PCP: [name], FNP  HPI: Brandi Hunter is a 74 y.o. female who came to [facility] ed 01/07/15, upon the recommendation of her general surgeon, for inpatient management of a refractory left axilla abscess  Subjective / 24 H Interval events - no complaints this morning, no chest pain/dyspnea  Assessment/Plan: Active Problems:   Mobitz type II atrioventricular block   HTN (hypertension)   Obesity   OSA (obstructive sleep apnea)   Chronic asthmatic bronchitis   Paroxysmal atrial fibrillation   Abscess of axilla, left   Cellulitis of left axilla  Left axillary abscess - general surgery following, plan for I&D today - she has had few I&Ds recently in the ED in [place] with recurrence, appreciate surgical management in the OR - Continue IV antibiotics  Hypertension - continue home medications  Diabetes mellitus - sliding scale insulin  A fib/heart block - has a pacemaker - anticoagulated with Eliquis, start tomorrow  OSA - on Cpap  Chronic asthmatic bronchitis - no wheezing, stable   Diet: Diet NPO time specified Fluids: none  DVT Prophylaxis: on hold   Code Status: DNR Family Communication: d/w patient  Disposition Plan: inpatient  Consultants:  General Surgery   Procedures:  None    Antibiotics Vancomycin 2/23 >> Clindamycin 2/23 >>   Studies  No results found.  Objective  Filed Vitals:   01/09/15 0536 01/09/15 0849 01/09/15 0900 01/09/15 1047  BP: 139/53   150/55  Pulse: 63   62  Temp: 98.5 F (36.9 C)  98.8 F (37.1 C) 98.8 F (37.1 C)  TempSrc: Oral   Oral  Resp: 16   18  Weight:      SpO2: 94% 92%  94%    Intake/Output Summary (Last 24 hours) at 01/09/15 1305 Last data filed at 01/09/15 1100  Gross per 24 hour  Intake    480 ml  Output   3500 ml  Net  -3020 ml   Filed Weights   01/08/15 0009  Weight: 111.313 kg (245 lb 6.4 oz)    Exam:  General:   NAD  HEENT: no scleral icterus  Cardiovascular: RRR  Respiratory: no wheezing  Abdomen: soft, non tender  MSK/Extremities: fluctuance left axilla with purulent drainage   Skin: no rashes  Neuro: non focal  Data Reviewed: Basic Metabolic Panel:  Recent Labs Lab 01/07/15 1938  NA 138  K 4.0  CL 101  CO2 27  GLUCOSE 124*  BUN 18  CREATININE 0.94  CALCIUM 9.5   Liver Function Tests:  Recent Labs Lab 01/07/15 1938  AST 20  ALT 16  ALKPHOS 35*  BILITOT 0.7  PROT 6.6  ALBUMIN 3.5   CBC:  Recent Labs Lab 01/07/15 1938  WBC 7.5  NEUTROABS 4.2  HGB 12.1  HCT 37.8  MCV 89.4  PLT 355   CBG:  Recent Labs Lab 01/08/15 1200 01/08/15 1706 01/08/15 2216 01/09/15 0741 01/09/15 1059  GLUCAP 281* 71 198* 172* 165*    Scheduled Meds: . [MAR Hold] amLODipine  10 mg Oral Daily  . [MAR Hold] atorvastatin  40 mg Oral q1800  . [MAR Hold] budesonide-formoterol  2 puff Inhalation BID  . [MAR Hold] citalopram  40 mg Oral QHS  . [MAR Hold] clindamycin (CLEOCIN) IV  600 mg Intravenous Q6H  . [MAR Hold] fenofibrate  160 mg Oral QPM  . [MAR Hold] FLORA-Q  1 capsule Oral Daily  . [MAR Hold] furosemide  20 mg Oral Q M,W,F  . [MAR Hold] glipiZIDE  10 mg Oral BID WC  . [MAR Hold] insulin aspart  0-15 Units Subcutaneous TID WC  . [MAR Hold] ipratropium-albuterol  3 mL Nebulization BID  . [MAR Hold] losartan  100 mg Oral QPM  . [MAR Hold] metoprolol succinate  100 mg Oral Daily  . [MAR Hold] oxybutynin  10 mg Oral Daily  . [MAR Hold] pantoprazole  40 mg Oral Daily  . [MAR Hold] potassium chloride  10 mEq Oral Daily  . [MAR Hold] spironolactone  12.5 mg Oral Daily  . [MAR Hold] vancomycin  750 mg Intravenous Q12H   Continuous Infusions: . lactated ringers 1,000 mL (01/09/15 1243)    Marzetta Board, MD [facility] Pager [phone]. If 7 PM - 7 AM, please contact night-coverage at [email or web address], password [facility] 01/09/2015, 1:05 PM  LOS: 2 days

## 2015-01-09 NOTE — Transfer of Care (Signed)
Immediate Anesthesia Transfer of Care Note  Patient: Brandi Hunter  Procedure(s) Performed: Procedure(s): Irrigation and Drainage Abcess left Axilla (Left)  Patient Location: PACU  Anesthesia Type:MAC  Level of Consciousness: awake, alert , oriented and patient cooperative  Airway & Oxygen Therapy: Patient Spontanous Breathing and Patient connected to face mask oxygen  Post-op Assessment: Report given to RN and Post -op Vital signs reviewed and stable  Post vital signs: Reviewed and stable  Last Vitals:  Filed Vitals:   01/09/15 1047  BP: 150/55  Pulse: 62  Temp: 37.1 C  Resp: 18    Complications: No apparent anesthesia complications

## 2015-01-09 NOTE — Progress Notes (Signed)
Received Pt from PACU RN Trude Mcburney. Pt alert and oriented, able to communicate needs, will continue to monitor.

## 2015-01-10 ENCOUNTER — Encounter (HOSPITAL_COMMUNITY): Payer: Self-pay | Admitting: General Surgery

## 2015-01-10 LAB — CREATININE, SERUM
Creatinine, Ser: 0.82 mg/dL (ref 0.50–1.10)
GFR calc Af Amer: 81 mL/min — ABNORMAL LOW (ref >90)
GFR, EST NON AFRICAN AMERICAN: 70 mL/min — AB (ref >90)

## 2015-01-10 LAB — GLUCOSE, CAPILLARY
GLUCOSE-CAPILLARY: 132 mg/dL — AB (ref 70–99)
Glucose-Capillary: 127 mg/dL — ABNORMAL HIGH (ref 70–99)

## 2015-01-10 LAB — VANCOMYCIN, TROUGH: VANCOMYCIN TR: 11.5 ug/mL (ref 10.0–20.0)

## 2015-01-10 MED ORDER — CLINDAMYCIN HCL 150 MG PO CAPS: 300.0000 mg | ORAL_CAPSULE | Freq: Three times a day (TID) | ORAL | Status: DC

## 2015-01-10 NOTE — Progress Notes (Signed)
Discharge instruction given to pt, verbalized understanding. Left the unit in stable condition.

## 2015-01-10 NOTE — Discharge Instructions (Signed)
Please hold Apixiban (Eliquis) for 2 days. Please resume this medication on Saturday 2/27. Please take Clindamycin for 10 more days   Follow with Brandi Aly, FNP in 5-7 days  Please get a complete blood count and chemistry panel checked by your Primary MD at your next visit, and again as instructed by your Primary MD. Please get your medications reviewed and adjusted by your Primary MD.  Please request your Primary MD to go over all Hospital Tests and Procedure/Radiological results at the follow up, please get all Hospital records sent to your Prim MD by signing hospital release before you go home.  If you had Pneumonia of Lung problems at the Hospital: Please get a 2 view Chest X ray done in 6-8 weeks after hospital discharge or sooner if instructed by your Primary MD.  If you have Congestive Heart Failure: Please call your Cardiologist or Primary MD anytime you have any of the following symptoms:  1) 3 pound weight gain in 24 hours or 5 pounds in 1 week  2) shortness of breath, with or without a dry hacking cough  3) swelling in the hands, feet or stomach  4) if you have to sleep on extra pillows at night in order to breathe  Follow cardiac low salt diet and 1.5 lit/day fluid restriction.  If you have diabetes Accuchecks 4 times/day, Once in AM empty stomach and then before each meal. Log in all results and show them to your primary doctor at your next visit. If any glucose reading is under 80 or above 300 call your primary MD immediately.  If you have Seizure/Convulsions/Epilepsy: Please do not drive, operate heavy machinery, participate in activities at heights or participate in high speed sports until you have seen by Primary MD or a Neurologist and advised to do so again.  If you had Gastrointestinal Bleeding: Please ask your Primary MD to check a complete blood count within one week of discharge or at your next visit. Your endoscopic/colonoscopic biopsies that are pending  at the time of discharge, will also need to followed by your Primary MD.  Get Medicines reviewed and adjusted. Please take all your medications with you for your next visit with your Primary MD  Please request your Primary MD to go over all hospital tests and procedure/radiological results at the follow up, please ask your Primary MD to get all Hospital records sent to his/her office.  If you experience worsening of your admission symptoms, develop shortness of breath, life threatening emergency, suicidal or homicidal thoughts you must seek medical attention immediately by calling 911 or calling your MD immediately  if symptoms less severe.  You must read complete instructions/literature along with all the possible adverse reactions/side effects for all the Medicines you take and that have been prescribed to you. Take any new Medicines after you have completely understood and accpet all the possible adverse reactions/side effects.   Do not drive or operate heavy machinery when taking Pain medications.   Do not take more than prescribed Pain, Sleep and Anxiety Medications  Special Instructions: If you have smoked or chewed Tobacco  in the last 2 yrs please stop smoking, stop any regular Alcohol  and or any Recreational drug use.  Wear Seat belts while driving.  Please note You were cared for by a hospitalist during your hospital stay. If you have any questions about your discharge medications or the care you received while you were in the hospital after you are discharged, you can call the  unit and asked to speak with the hospitalist on call if the hospitalist that took care of you is not available. Once you are discharged, your primary care physician will handle any further medical issues. Please note that NO REFILLS for any discharge medications will be authorized once you are discharged, as it is imperative that you return to your primary care physician (or establish a relationship with a  primary care physician if you do not have one) for your aftercare needs so that they can reassess your need for medications and monitor your lab values.  You can reach the hospitalist office at phone [phone] or fax [phone]   If you do not have a primary care physician, you can call [phone] for a physician referral.  Activity: As tolerated with Full fall precautions use walker/cane & assistance as needed  Diet: heart healthy  Disposition Home

## 2015-01-10 NOTE — Progress Notes (Signed)
ANTIBIOTIC CONSULT NOTE - FOLLOW UP  Pharmacy Consult for: vancomycin Indication: L axillary abscess, s/p I&D  Allergies  Allergen Reactions  . Bee Venom Swelling  . Latex Swelling    LATEX CATHETERS    Patient Measurements: Weight: 245 lb 6.4 oz (111.313 kg)   Vital Signs: Temp: 98.1 Hunter (36.7 C) (02/25 0500) Temp Source: Oral (02/25 0500) BP: 129/54 mmHg (02/25 0500) Pulse Rate: 66 (02/25 0500) Intake/Output from previous day: 02/24 0701 - 02/25 0700 In: [place] [P.O.:360; I.V.:1400] Out: 2220 [Urine:2200; Blood:20] Intake/Output from this shift:    Labs:  Recent Labs  01/07/15 1938 01/10/15 0700  WBC 7.5  --   HGB 12.1  --   PLT 355  --   CREATININE 0.94 0.82   Estimated Creatinine Clearance: 79.6 mL/min (by C-G formula based on Cr of 0.82).  Recent Labs  01/10/15 0750  Sidney 11.5     Microbiology: No cultures to date  Antimicrobials: 2/22>>vancomycin>> 2/23>>clindamycin>>   Assessment: 74 y/o Hunter with L axillary abscess refractory to outpatient antibiotic management, presented to ED on 01/07/15 and was admitted for IV antibiotic therapy and surgical drainage.  Orders received to begin vancomycin with pharmacy dosing assistance and clindamycin at MD-ordered dosage.  Goal of Therapy:  Appropriate antibiotic dosing for indication and renal function; eradication of infection. Vancomycin trough 10-15 (after I&D of abscess; this goal range was selected to reduce risk of nephrotoxicity)  Significant Events: 2/24: Incision, drainage, and debridement of left axillary abscess  Today, 01/10/2015 D#4 vancomycin 750 mg IV q12h (after 1 gram loading dose) D#3 clindamycin 600 mg IV q6h Afebrile WBC WNL (2/22) SCr WNL, stable Vancomycin trough therapeutic  Plan:  1. Continue vancomycin at present dosage (750 mg IV q12h) 2. Continue clindamycin as ordered by M.D. (600mg  IV q12h) 3. Follow renal function, clinical course.  Clayburn Pert, PharmD,  BCPS Pager: [phone] 01/10/2015  8:53 AM

## 2015-01-10 NOTE — Progress Notes (Signed)
[facility] Progress Note  1 Day Post-Op  Subjective: Pt denies any significant pain.  She hasn't taken any pain meds.  Tolerating diet well.  Dressing change was minimally painful.  She asks if she can change the dressing herself.  Her son can also help.  Objective: Vital signs in last 24 hours: Temp:  [98.1 F (36.7 C)-98.8 F (37.1 C)] 98.1 F (36.7 C) (02/25 0500) Pulse Rate:  [60-72] 66 (02/25 0500) Resp:  [12-18] 16 (02/25 0500) BP: (129-172)/(53-78) 129/54 mmHg (02/25 0500) SpO2:  [92 %-98 %] 93 % (02/25 0500) Last BM Date: 01/07/15  Intake/Output from previous day: 02/24 0701 - 02/25 0700 In: 1760 [P.O.:360; I.V.:1400] Out: 2220 [Urine:2200; Blood:20] Intake/Output this shift:    PE: Gen:  Alert, NAD, pleasant Left axilla: 4x2cm open wound in left axilla with reduced induration, no fluctuance, not drainage, blackened areas where cautery was used in base of wound, otherwise clean with minimal serous drainage.  Lab Results:   Recent Labs  01/07/15 1938  WBC 7.5  HGB 12.1  HCT 37.8  PLT 355   BMET  Recent Labs  01/07/15 1938  NA 138  K 4.0  CL 101  CO2 27  GLUCOSE 124*  BUN 18  CREATININE 0.94  CALCIUM 9.5   PT/INR  Recent Labs  01/08/15 0552  LABPROT 14.6  INR 1.13   CMP     Component Value Date/Time   NA 138 01/07/2015 1938   K 4.0 01/07/2015 1938   CL 101 01/07/2015 1938   CO2 27 01/07/2015 1938   GLUCOSE 124* 01/07/2015 1938   BUN 18 01/07/2015 1938   CREATININE 0.94 01/07/2015 1938   CALCIUM 9.5 01/07/2015 1938   PROT 6.6 01/07/2015 1938   ALBUMIN 3.5 01/07/2015 1938   AST 20 01/07/2015 1938   ALT 16 01/07/2015 1938   ALKPHOS 35* 01/07/2015 1938   BILITOT 0.7 01/07/2015 1938   GFRNONAA 60* 01/07/2015 1938   GFRAA 69* 01/07/2015 1938   Lipase     Component Value Date/Time   LIPASE 1957* 05/04/2010 2338       Studies/Results: No results found.  Anti-infectives: Anti-infectives    Start     Dose/Rate  Route Frequency Ordered Stop   01/08/15 1000  clindamycin (CLEOCIN) IVPB 300 mg  Status:  Discontinued    Comments:  Pharmacy please check dosing   300 mg 100 mL/hr over 30 Minutes Intravenous Every 12 hours 01/08/15 0117 01/08/15 0844   01/08/15 1000  clindamycin (CLEOCIN) IVPB 600 mg    Comments:  Pharmacy please check dosing   600 mg 100 mL/hr over 30 Minutes Intravenous Every 6 hours 01/08/15 0844     01/08/15 0800  vancomycin (VANCOCIN) IVPB 750 mg/150 ml premix     750 mg 150 mL/hr over 60 Minutes Intravenous Every 12 hours 01/08/15 0520     01/07/15 2230  vancomycin (VANCOCIN) IVPB 1000 mg/200 mL premix     1,000 mg 200 mL/hr over 60 Minutes Intravenous  Once 01/07/15 2225 01/07/15 2335       Assessment/Plan Abscess of axilla, left POD #1 s/p I&D -Start WD dressing changes daily, encouraged her to shower with dressing out and repack after shower -Ambulate and IS -SCD's and resume eliquis in 3 days after the surgery -Carb mod diet, IVF, pain control -Can discharge home today if medically stable -She says she can change her dressings herself and won't need [place] -Follow up with Dr. Zella Richer in 2-3 weeks for  wound check, have her call the office to arrange date/time Chronic anticoagulation with Eliquis    LOS: 3 days    [name] 01/10/2015, 7:33 AM Pager: [phone]

## 2015-01-10 NOTE — Discharge Summary (Signed)
Physician Discharge Summary  Saadia Dewitt Murtaugh UKG:[id number] DOB: 1941/03/23 DOA: 01/07/2015  PCP: [name], FNP  Admit date: 01/07/2015 Discharge date: 01/10/2015  Time spent: 45 minutes  Recommendations for Outpatient Follow-up:  1. Follow up with Dr. Ouida Sills in 1 week 2. Follow up with Dr. Zella Richer in 2 weeks  3. Continue Clindamycin for 9 additional days 4. Resume Eliquis on Saturday 2/27  Discharge Diagnoses:  Active Problems:   Mobitz type II atrioventricular block   HTN (hypertension)   Obesity   OSA (obstructive sleep apnea)   Chronic asthmatic bronchitis   Paroxysmal atrial fibrillation   Abscess of axilla, left   Cellulitis of left axilla  Discharge Condition: stable  Diet recommendation: heart healthy  Filed Weights   01/08/15 0009  Weight: 111.313 kg (245 lb 6.4 oz)   History of present illness:  Brandi Hunter is a 74 y.o. female who came to [facility] ed 01/07/15, upon the recommendation of her general surgeon, for inpatient management of a refractory left axilla abscess. The patient is on chronic anticoagulation with Eliquis for a history of atrial fibrillation. She has a PPM. Symptoms began one week ago, with left axilla pain and itching. She could feel two knot in her left axilla. She says they were never blisters. She was seen in the ED in [place] on 01/04/15, where she received a dose of IV vancomycin and oral bactrim, and she was discharged on oral clindamycin. She had a low grade fever to 100.4 at that time. I and D was attempted at that time. She followed up two days later as recommended. She received another dose of IV vancomycin. When it was recommended that she would need to see a surgeon for I and D, she decided to see her doctor in [place]. Because she is on chronic anticoagulation, she was referred for admission, to be taken to the OR because she has a high risk for bleeding complication. No chills or sweats. No nausea or vomiting. No  diarrhea.  Hospital Course:   Left axillary abscess - general surgery following, patient underwent I&D on 2/24, recovered well, very knowledgeable regarding dressing changes. She had no pain post op, she was discharged on Clindamycin which she is to continue for 9 additional days following the surgery. She needs outpatient follow up with her PCP and General Surgery.  Hypertension - continue home medications Diabetes mellitus - sliding scale insulin A fib/heart block - has a pacemaker, anticoagulated, this is to be resumed 3 days after surgery, on Saturday. Patient instructed.  OSA - on Cpap Chronic asthmatic bronchitis - no wheezing, stable  Patient is DNR per her wishes.   Procedures:  I&D left axilla 2/23   Consultations:  General Surgery   Discharge Exam: Filed Vitals:   01/09/15 2312 01/10/15 0500 01/10/15 0837 01/10/15 1000  BP: 138/56 129/54  134/59  Pulse: 71 66  70  Temp: 98.4 F (36.9 C) 98.1 F (36.7 C)    TempSrc: Oral Oral    Resp: 16 16  18   Weight:      SpO2: 95% 93% 95%    General: NAD Cardiovascular: RRR Respiratory: CTA biL   Discharge Instructions     Medication List    TAKE these medications        ALIGN PO  Take 1 capsule by mouth daily.     amLODipine 10 MG tablet  Commonly known as:  NORVASC  TAKE ONE TABLET BY MOUTH ONCE DAILY     apixaban 5  MG Tabs tablet  Commonly known as:  ELIQUIS  Take 1 tablet (5 mg total) by mouth 2 (two) times daily.     atorvastatin 40 MG tablet  Commonly known as:  LIPITOR  Take 40 mg by mouth daily.     budesonide-formoterol 160-4.5 MCG/ACT inhaler  Commonly known as:  SYMBICORT  Inhale 2 puffs into the lungs 2 (two) times daily.     citalopram 40 MG tablet  Commonly known as:  CELEXA  Take 40 mg by mouth at bedtime.     clindamycin 150 MG capsule  Commonly known as:  CLEOCIN  Take 2 capsules (300 mg total) by mouth 3 (three) times daily.     fenofibrate 160 MG tablet  Take 160 mg by mouth  every evening.     furosemide 20 MG tablet  Commonly known as:  LASIX  Take 20 mg by mouth every Monday, Wednesday, and Friday.     glipiZIDE 10 MG 24 hr tablet  Commonly known as:  GLUCOTROL XL  Take 10 mg by mouth 2 (two) times daily.     HYDROcodone-acetaminophen 5-325 MG per tablet  Commonly known as:  NORCO/VICODIN  Take 1 tablet by mouth every 6 (six) hours as needed for moderate pain.     ipratropium-albuterol 0.5-2.5 (3) MG/3ML Soln  Commonly known as:  DUONEB  Take 3 mLs by nebulization 2 (two) times daily.     LORazepam 0.5 MG tablet  Commonly known as:  ATIVAN  Take 1 tablet by mouth every 6 (six) hours as needed for anxiety.     losartan 100 MG tablet  Commonly known as:  COZAAR  Take 100 mg by mouth every evening.     metFORMIN 1000 MG tablet  Commonly known as:  GLUCOPHAGE  Take 1,000 mg by mouth 2 (two) times daily with a meal.     metoprolol succinate 100 MG 24 hr tablet  Commonly known as:  TOPROL-XL  TAKE ONE TABLET BY MOUTH ONCE DAILY     oxybutynin 10 MG 24 hr tablet  Commonly known as:  DITROPAN-XL  Take 10 mg by mouth daily.     pantoprazole 40 MG tablet  Commonly known as:  PROTONIX  Take 40 mg by mouth daily.     potassium chloride 10 MEQ tablet  Commonly known as:  K-DUR,KLOR-CON  Take 10 mEq by mouth daily.     PROAIR HFA 108 (90 BASE) MCG/ACT inhaler  Generic drug:  albuterol  Inhale 2 puffs into the lungs every 4 (four) hours as needed for wheezing. Inhale 2 puffs into the lungs every 4 (four) hours as needed.     RA CALCIUM PLUS VITAMIN D 600-400 MG-UNIT per tablet  Generic drug:  Calcium Carbonate-Vitamin D  Take 2 tablets by mouth daily.     spironolactone 25 MG tablet  Commonly known as:  ALDACTONE  Take 0.5 tablets (12.5 mg total) by mouth daily.     Vitamin B-12 2000 MCG Tbcr  Take 1 tablet by mouth 2 (two) times daily.           Follow-up Information    Follow up with ROSENBOWER,[name] J, MD. Schedule an appointment as  soon as possible for a visit in 2 weeks.   Specialty:  General Surgery   Why:  For post-operation check.  Call the office to find out date/time.   Contact information:   [address] [place] [address] [phone]  Follow up with ANDERSON,TERESA, FNP In 1 week.   Specialty:  Nurse Practitioner   Contact information:   [address] [place] [address] [phone]       The results of significant diagnostics from this hospitalization (including imaging, microbiology, ancillary and laboratory) are listed below for reference.    Labs: Basic Metabolic Panel:  Recent Labs Lab 01/07/15 1938 01/10/15 0700  NA 138  --   K 4.0  --   CL 101  --   CO2 27  --   GLUCOSE 124*  --   BUN 18  --   CREATININE 0.94 0.82  CALCIUM 9.5  --    Liver Function Tests:  Recent Labs Lab 01/07/15 1938  AST 20  ALT 16  ALKPHOS 35*  BILITOT 0.7  PROT 6.6  ALBUMIN 3.5   CBC:  Recent Labs Lab 01/07/15 1938  WBC 7.5  NEUTROABS 4.2  HGB 12.1  HCT 37.8  MCV 89.4  PLT 355    CBG:  Recent Labs Lab 01/09/15 1449 01/09/15 1636 01/09/15 2130 01/10/15 0732 01/10/15 1141  GLUCAP 112* 143* 159* 127* 132*    Signed:  GHERGHE, COSTIN  [facility] 01/10/2015, 6:08 PM

## 2015-01-21 ENCOUNTER — Ambulatory Visit (INDEPENDENT_AMBULATORY_CARE_PROVIDER_SITE_OTHER): Payer: Medicare Other | Admitting: *Deleted

## 2015-01-21 DIAGNOSIS — I441 Atrioventricular block, second degree: Secondary | ICD-10-CM

## 2015-01-21 LAB — MDC_IDC_ENUM_SESS_TYPE_REMOTE
Battery Voltage: 2.78 V
Brady Statistic AS VS Percent: 14 %
Lead Channel Impedance Value: 519 Ohm
Lead Channel Pacing Threshold Amplitude: 0.625 V
Lead Channel Setting Pacing Amplitude: 2 V
Lead Channel Setting Pacing Amplitude: 2.5 V
Lead Channel Setting Sensing Sensitivity: 4 mV
MDC IDC MSMT BATTERY IMPEDANCE: 253 Ohm
MDC IDC MSMT BATTERY REMAINING LONGEVITY: 102 mo
MDC IDC MSMT LEADCHNL RA IMPEDANCE VALUE: 460 Ohm
MDC IDC MSMT LEADCHNL RA SENSING INTR AMPL: 2.8 mV
MDC IDC MSMT LEADCHNL RV PACING THRESHOLD PULSEWIDTH: 0.4 ms
MDC IDC SESS DTM: 20160307140544
MDC IDC SET LEADCHNL RV PACING PULSEWIDTH: 0.4 ms
MDC IDC STAT BRADY AP VP PERCENT: 73 %
MDC IDC STAT BRADY AP VS PERCENT: 0 %
MDC IDC STAT BRADY AS VP PERCENT: 13 %

## 2015-01-21 NOTE — Progress Notes (Signed)
Remote pacemaker transmission.   

## 2015-01-28 ENCOUNTER — Encounter: Payer: Self-pay | Admitting: Cardiology

## 2015-02-04 ENCOUNTER — Encounter: Payer: Self-pay | Admitting: Internal Medicine

## 2015-02-26 ENCOUNTER — Other Ambulatory Visit: Payer: Self-pay | Admitting: Nurse Practitioner

## 2015-02-26 ENCOUNTER — Other Ambulatory Visit: Payer: Self-pay | Admitting: Internal Medicine

## 2015-03-06 ENCOUNTER — Ambulatory Visit
Admission: RE | Admit: 2015-03-06 | Discharge: 2015-03-06 | Disposition: A | Discharge status: Home / Self Care | Payer: Medicare Other | Source: Ambulatory Visit | Attending: Nurse Practitioner | Admitting: Nurse Practitioner

## 2015-03-06 DIAGNOSIS — Z1231 Encounter for screening mammogram for malignant neoplasm of breast: Secondary | ICD-10-CM

## 2015-03-06 DIAGNOSIS — E2839 Other primary ovarian failure: Secondary | ICD-10-CM

## 2015-04-22 ENCOUNTER — Encounter: Payer: Medicare Other | Admitting: *Deleted

## 2015-04-22 ENCOUNTER — Telehealth: Payer: Self-pay | Admitting: Cardiology

## 2015-04-22 NOTE — Telephone Encounter (Signed)
Spoke with pt and reminded pt of remote transmission that is due today. Pt verbalized understanding.   

## 2015-04-23 ENCOUNTER — Ambulatory Visit (INDEPENDENT_AMBULATORY_CARE_PROVIDER_SITE_OTHER): Payer: Medicare Other | Admitting: *Deleted

## 2015-04-23 ENCOUNTER — Encounter: Payer: Self-pay | Admitting: Internal Medicine

## 2015-04-23 DIAGNOSIS — I441 Atrioventricular block, second degree: Secondary | ICD-10-CM | POA: Diagnosis not present

## 2015-04-23 DIAGNOSIS — I480 Paroxysmal atrial fibrillation: Secondary | ICD-10-CM

## 2015-04-23 LAB — CUP PACEART INCLINIC DEVICE CHECK
Battery Impedance: 252 Ohm
Battery Remaining Longevity: 102 mo
Battery Voltage: 2.78 V
Brady Statistic AP VP Percent: 66 %
Brady Statistic AP VS Percent: 0 %
Brady Statistic AS VP Percent: 21 %
Date Time Interrogation Session: 20160607104856
Lead Channel Impedance Value: 454 Ohm
Lead Channel Impedance Value: 501 Ohm
Lead Channel Pacing Threshold Amplitude: 0.75 V
Lead Channel Pacing Threshold Amplitude: 1 V
Lead Channel Pacing Threshold Pulse Width: 0.4 ms
Lead Channel Setting Pacing Amplitude: 2.5 V
Lead Channel Setting Pacing Pulse Width: 0.4 ms
Lead Channel Setting Sensing Sensitivity: 4 mV
MDC IDC MSMT LEADCHNL RA PACING THRESHOLD PULSEWIDTH: 0.4 ms
MDC IDC MSMT LEADCHNL RA SENSING INTR AMPL: 2 mV
MDC IDC MSMT LEADCHNL RV SENSING INTR AMPL: 11.2 mV
MDC IDC SET LEADCHNL RA PACING AMPLITUDE: 2 V
MDC IDC STAT BRADY AS VS PERCENT: 13 %

## 2015-04-23 NOTE — Progress Notes (Signed)
Pacemaker check in clinic- add on- unable to transmit 04/22/15. Normal device function. Thresholds, sensing, impedances consistent with previous measurements. Device programmed to maximize longevity. No mode switches. 1 high ventricular rate- 4 seconds, pk V 295, no egm. Device programmed at appropriate safety margins. Histogram distribution appropriate for patient activity level. Device programmed to optimize intrinsic conduction. Estimated longevity 7-10 years. Patient enrolled in remote follow-up. [facility] 07/23/15, ROV with JA in December.

## 2015-05-15 ENCOUNTER — Other Ambulatory Visit: Payer: Self-pay | Admitting: Internal Medicine

## 2015-05-21 ENCOUNTER — Ambulatory Visit (INDEPENDENT_AMBULATORY_CARE_PROVIDER_SITE_OTHER): Payer: Medicare Other | Admitting: Nurse Practitioner

## 2015-05-21 ENCOUNTER — Encounter: Payer: Self-pay | Admitting: Nurse Practitioner

## 2015-05-21 VITALS — BP 144/80 | HR 64 | Resp 18 | Ht 66.0 in | Wt 250.0 lb

## 2015-05-21 DIAGNOSIS — Z950 Presence of cardiac pacemaker: Secondary | ICD-10-CM | POA: Diagnosis not present

## 2015-05-21 DIAGNOSIS — E669 Obesity, unspecified: Secondary | ICD-10-CM | POA: Diagnosis not present

## 2015-05-21 DIAGNOSIS — I10 Essential (primary) hypertension: Secondary | ICD-10-CM

## 2015-05-21 DIAGNOSIS — I480 Paroxysmal atrial fibrillation: Secondary | ICD-10-CM

## 2015-05-21 NOTE — Patient Instructions (Addendum)
We will be checking the following labs today - NONE   Medication Instructions:    Continue with your current medicines.     Testing/Procedures To Be Arranged:  N/A  Follow-Up:   See Dr. [name] in 6 months    Other Special Instructions:   N/A  Call the [place] office at [phone] if you have any questions, problems or concerns.

## 2015-05-21 NOTE — Progress Notes (Signed)
CARDIOLOGY OFFICE NOTE  Date:  05/21/2015    Brandi Hunter Date of Birth: 08/09/1941 Medical Record #[id number]  PCP:  [name], FNP  Cardiologist:  [name]    Chief Complaint  Patient presents with  . PAF follow up    6 month check - seen for Dr. [name].     History of Present Illness: Brandi Hunter is a 74 y.o. female who presents today for a 6 month check. Seen for Dr. [name]. Brandi Hunter has had AV block with PPM dating back to 2011, HTN, diabetes, obesity, HLD, DJD, asthma, COPD, OSA and depression. Normal Myoview in October of 2013. Normal EF per echo in June of 2015. Brandi Hunter has had past tobacco abuse. Brandi Hunter has had PAF noted in the past year or so - now on anticoagulation with Eliquis.   Comes in today. Here with Brandi Hunter husband. Doing ok. Says everything is "about the same". Brandi Hunter has chronic DOE. Chronic fatigue. No palpitations. Not dizzy or lightheaded. Has had to have an abscess drained back in February. Recent lab by PCP reported. No chest pain. Gets too much salt due to excessive eating out. No bleeding/bruising. Overall, Brandi Hunter feels like Brandi Hunter is doing ok.  Past Medical History  Diagnosis Date  . Pacemaker     Implanted  by Dr [name] (MDT) 10/06/10  . Mobitz (type) II atrioventricular block   . HTN (hypertension)   . Obesity   . Hyperlipidemia   . DJD (degenerative joint disease)   . Asthma   . COPD (chronic obstructive pulmonary disease)   . Pancreatitis 2010 OR 2011    pt thought this had been ruled out  . Sleep apnea     CPA SETTING OF 14  . Diabetes mellitus without complication     TYPE 2  . GERD (gastroesophageal reflux disease)   . History of home oxygen therapy     oxygen 2 liters per nasal cannula at hs  . PAF (paroxysmal atrial fibrillation)   . Chronic anticoagulation     Past Surgical History  Procedure Laterality Date  . Insert / replace / remove pacemaker  10/06/10    MDT  implanted by Dr [name]  . Total abdominal hysterectomy  1971  . Cystocele  repair    . Ovary surgery      removal  . Repair rectocele    . Knee arthroscopy      both  . Cataract extraction w/phaco  11/02/2011    Procedure: CATARACT EXTRACTION PHACO AND INTRAOCULAR LENS PLACEMENT (IOC);  Surgeon: Williams Che;  Location: [facility];  Service: Ophthalmology;  Laterality: Right;  CDE=7.33  . Cataract extraction w/phaco  12/07/2011    Procedure: CATARACT EXTRACTION PHACO AND INTRAOCULAR LENS PLACEMENT (IOC);  Surgeon: Williams Che, MD;  Location: [facility];  Service: Ophthalmology;  Laterality: Left;  CDE 3.61  . Eye surgery  11/01/2012    BOTH EYES CATARACTS  . Tonsillectomy  AGE 74  . Colonoscopy with propofol N/A 08/09/2014    Procedure: COLONOSCOPY WITH PROPOFOL;  Surgeon: Juanita Craver, MD;  Location: [facility];  Service: Endoscopy;  Laterality: N/A;  . Esophagogastroduodenoscopy (egd) with propofol N/A 08/09/2014    Procedure: ESOPHAGOGASTRODUODENOSCOPY (EGD) WITH PROPOFOL;  Surgeon: Juanita Craver, MD;  Location: [facility];  Service: Endoscopy;  Laterality: N/A;  . Simple mastectomy with axillary sentinel node biopsy Left 01/09/2015    Procedure: Irrigation and Drainage Abcess left Axilla;  Surgeon: Jackolyn Confer, MD;  Location: [facility];  Service: General;  Laterality: Left;     Medications: Current Outpatient Prescriptions  Medication Sig Dispense Refill  . albuterol (PROAIR HFA) 108 (90 BASE) MCG/ACT inhaler Inhale 2 puffs into the lungs every 4 (four) hours as needed for wheezing. Inhale 2 puffs into the lungs every 4 (four) hours as needed.    Marland Kitchen amLODipine (NORVASC) 10 MG tablet TAKE ONE TABLET BY MOUTH ONCE DAILY 90 tablet 1  . atorvastatin (LIPITOR) 40 MG tablet Take 40 mg by mouth daily.     . budesonide-formoterol (SYMBICORT) 160-4.5 MCG/ACT inhaler Inhale 2 puffs into the lungs 2 (two) times daily.    . Calcium Carbonate-Vitamin D (RA CALCIUM PLUS VITAMIN D) 600-400 MG-UNIT per tablet Take 2 tablets by mouth daily.      . citalopram (CELEXA) 40 MG  tablet Take 40 mg by mouth at bedtime.     . Cyanocobalamin (VITAMIN B-12) 2000 MCG TBCR Take 1 tablet by mouth 2 (two) times daily.    Marland Kitchen ELIQUIS 5 MG TABS tablet TAKE ONE TABLET BY MOUTH TWICE DAILY 60 tablet 3  . fenofibrate 160 MG tablet Take 160 mg by mouth every evening.    . furosemide (LASIX) 20 MG tablet Take 20 mg by mouth every Monday, Wednesday, and Friday.     Marland Kitchen glipiZIDE (GLUCOTROL XL) 10 MG 24 hr tablet Take 10 mg by mouth 2 (two) times daily.     Marland Kitchen ipratropium-albuterol (DUONEB) 0.5-2.5 (3) MG/3ML SOLN Take 3 mLs by nebulization 2 (two) times daily.    Marland Kitchen LORazepam (ATIVAN) 0.5 MG tablet Take 1 tablet by mouth every 6 (six) hours as needed for anxiety.     Marland Kitchen losartan (COZAAR) 100 MG tablet Take 100 mg by mouth every evening.    . metFORMIN (GLUCOPHAGE) 1000 MG tablet Take 1,000 mg by mouth 2 (two) times daily with a meal.    . metoprolol succinate (TOPROL-XL) 100 MG 24 hr tablet TAKE ONE TABLET BY MOUTH ONCE DAILY 90 tablet 0  . oxybutynin (DITROPAN-XL) 10 MG 24 hr tablet Take 10 mg by mouth daily.     . pantoprazole (PROTONIX) 40 MG tablet Take 40 mg by mouth daily.     . Probiotic Product (ALIGN PO) Take 1 capsule by mouth daily.     Marland Kitchen spironolactone (ALDACTONE) 25 MG tablet TAKE ONE-HALF TABLET BY MOUTH ONCE DAILY 90 tablet 0   No current facility-administered medications for this visit.    Allergies: Allergies  Allergen Reactions  . Bee Venom Swelling  . Latex Swelling    LATEX CATHETERS    Social History: The patient  reports that Brandi Hunter has been smoking Cigarettes.  Brandi Hunter has a 5 pack-year smoking history. Brandi Hunter quit smokeless tobacco use about 3 years ago. Brandi Hunter reports that Brandi Hunter drinks about 0.6 oz of alcohol per week. Brandi Hunter reports that Brandi Hunter does not use illicit drugs.   Family History: The patient's family history includes Arthritis in Brandi Hunter sister; Cancer in Brandi Hunter brother; Diabetes in Brandi Hunter brother; Heart disease in Brandi Hunter father and mother. There is no history of Anesthesia  problems, Hypotension, Malignant hyperthermia, or Pseudochol deficiency.   Review of Systems: Please see the history of present illness.   Otherwise, the review of systems is positive for none.   All other systems are reviewed and negative.   Physical Exam: VS:  BP 144/80 mmHg  Pulse 64  Resp 18  Ht 5\' 6"  (1.676 m)  Wt 250 lb (113.399 kg)  BMI 40.37 kg/m2 .  BMI  Body mass index is 40.37 kg/(m^2).  Wt Readings from Last 3 Encounters:  05/21/15 250 lb (113.399 kg)  01/08/15 245 lb 6.4 oz (111.313 kg)  10/19/14 242 lb 9.6 oz (110.043 kg)    General: Pleasant. Morbidly obese and in no acute distress. Weight is up 5 more pounds. HEENT: Normal. Neck: Supple, no JVD, carotid bruits, or masses noted.  Cardiac: Heart tones quite distant. No edema.  Respiratory:  Lungs are clear to auscultation bilaterally with normal work of breathing.  GI: Soft and nontender.  MS: No deformity or atrophy. Gait and ROM intact. Skin: Warm and dry. Color is normal.  Neuro:  Strength and sensation are intact and no gross focal deficits noted.  Psych: Alert, appropriate and with normal affect.   LABORATORY DATA:  EKG:  EKG is not ordered today.   Lab Results  Component Value Date   WBC 7.5 01/07/2015   HGB 12.1 01/07/2015   HCT 37.8 01/07/2015   PLT 355 01/07/2015   GLUCOSE 124* 01/07/2015   ALT 16 01/07/2015   AST 20 01/07/2015   NA 138 01/07/2015   K 4.0 01/07/2015   CL 101 01/07/2015   CREATININE 0.82 01/10/2015   BUN 18 01/07/2015   CO2 27 01/07/2015   INR 1.13 01/08/2015    BNP (last 3 results) No results for input(s): BNP in the last 8760 hours.  ProBNP (last 3 results) No results for input(s): PROBNP in the last 8760 hours.   Other Studies Reviewed Today:   Echo Study Conclusions from 04/2014  - Left ventricle: The cavity size was normal. Systolic function was normal. The estimated ejection fraction was in the range of 60% to 65%. Wall motion was normal; there were no  regional wall motion abnormalities. Doppler parameters are consistent with abnormal left ventricular relaxation (grade 1 diastolic dysfunction). - Aortic valve: Trileaflet; normal thickness leaflets. There was no regurgitation. - Aortic root: The aortic root was normal in size. - Mitral valve: There was mild regurgitation. - Left atrium: The atrium was mildly dilated. - Right ventricle: Systolic function was normal. - Right atrium: The atrium was normal in size. - Pulmonic valve: There was no regurgitation. - Pulmonary arteries: Systolic pressure was within the normal range. - Pericardium, extracardiac: There was no pericardial effusion.  Impressions:  - Normal biventricular size and function. Impaired relaxation. Mild mitral regurgitation  Assessment/Plan: 1. Mobitz II second degree AV block - has PPM in place - followed by Dr. [name].  2. HTN - no medicines taken today yet. Fair control.   3. Obesity - I do not get the feeling that Brandi Hunter will be able to make substantial changes.   4. Tobacco  - Encouraged Brandi Hunter to quit.   5. PAF - on anticoagulation - has had labs by PCP.   Current medicines are reviewed with the patient today.  The patient does not have concerns regarding medicines other than what has been noted above.  The following changes have been made:  See above.  Labs/ tests ordered today include:   No orders of the defined types were placed in this encounter.     Disposition:   FU with Dr. [name] in 6 months.   Patient is agreeable to this plan and will call if any problems develop in the interim.   Signed: [name], RN, ANP-C 05/21/2015 8:20 AM  [facility] [address] [place], [place]  [address] Phone: [phone] Fax: [phone]

## 2015-06-24 ENCOUNTER — Other Ambulatory Visit: Payer: Self-pay | Admitting: Nurse Practitioner

## 2015-06-24 ENCOUNTER — Other Ambulatory Visit: Payer: Self-pay | Admitting: Internal Medicine

## 2015-07-23 ENCOUNTER — Telehealth: Payer: Self-pay | Admitting: Cardiology

## 2015-07-23 ENCOUNTER — Ambulatory Visit (INDEPENDENT_AMBULATORY_CARE_PROVIDER_SITE_OTHER): Payer: Medicare Other | Admitting: *Deleted

## 2015-07-23 DIAGNOSIS — I441 Atrioventricular block, second degree: Secondary | ICD-10-CM

## 2015-07-23 NOTE — Telephone Encounter (Signed)
Spoke with pt and reminded pt of remote transmission that is due today. Pt verbalized understanding.   

## 2015-07-24 ENCOUNTER — Other Ambulatory Visit: Payer: Self-pay | Admitting: Internal Medicine

## 2015-07-24 NOTE — Progress Notes (Signed)
Remote pacemaker transmission.   

## 2015-08-03 ENCOUNTER — Emergency Department (HOSPITAL_BASED_OUTPATIENT_CLINIC_OR_DEPARTMENT_OTHER): Payer: Medicare Other

## 2015-08-03 ENCOUNTER — Emergency Department (HOSPITAL_BASED_OUTPATIENT_CLINIC_OR_DEPARTMENT_OTHER)
Admission: EM | Admit: 2015-08-03 | Discharge: 2015-08-04 | Disposition: A | Discharge status: Home / Self Care | Payer: Medicare Other | Attending: Emergency Medicine | Admitting: Emergency Medicine

## 2015-08-03 ENCOUNTER — Encounter (HOSPITAL_BASED_OUTPATIENT_CLINIC_OR_DEPARTMENT_OTHER): Payer: Self-pay | Admitting: Emergency Medicine

## 2015-08-03 DIAGNOSIS — J449 Chronic obstructive pulmonary disease, unspecified: Secondary | ICD-10-CM | POA: Diagnosis not present

## 2015-08-03 DIAGNOSIS — I480 Paroxysmal atrial fibrillation: Secondary | ICD-10-CM | POA: Diagnosis not present

## 2015-08-03 DIAGNOSIS — Z79899 Other long term (current) drug therapy: Secondary | ICD-10-CM | POA: Insufficient documentation

## 2015-08-03 DIAGNOSIS — Z720 Tobacco use: Secondary | ICD-10-CM | POA: Diagnosis not present

## 2015-08-03 DIAGNOSIS — E785 Hyperlipidemia, unspecified: Secondary | ICD-10-CM | POA: Diagnosis not present

## 2015-08-03 DIAGNOSIS — G4733 Obstructive sleep apnea (adult) (pediatric): Secondary | ICD-10-CM | POA: Insufficient documentation

## 2015-08-03 DIAGNOSIS — Z950 Presence of cardiac pacemaker: Secondary | ICD-10-CM | POA: Diagnosis not present

## 2015-08-03 DIAGNOSIS — E669 Obesity, unspecified: Secondary | ICD-10-CM | POA: Diagnosis not present

## 2015-08-03 DIAGNOSIS — Y9222 Religious institution as the place of occurrence of the external cause: Secondary | ICD-10-CM | POA: Diagnosis not present

## 2015-08-03 DIAGNOSIS — K219 Gastro-esophageal reflux disease without esophagitis: Secondary | ICD-10-CM | POA: Insufficient documentation

## 2015-08-03 DIAGNOSIS — Y9389 Activity, other specified: Secondary | ICD-10-CM | POA: Diagnosis not present

## 2015-08-03 DIAGNOSIS — S4992XA Unspecified injury of left shoulder and upper arm, initial encounter: Secondary | ICD-10-CM | POA: Insufficient documentation

## 2015-08-03 DIAGNOSIS — S8992XA Unspecified injury of left lower leg, initial encounter: Secondary | ICD-10-CM | POA: Diagnosis present

## 2015-08-03 DIAGNOSIS — S7012XA Contusion of left thigh, initial encounter: Secondary | ICD-10-CM | POA: Insufficient documentation

## 2015-08-03 DIAGNOSIS — W1839XA Other fall on same level, initial encounter: Secondary | ICD-10-CM | POA: Insufficient documentation

## 2015-08-03 DIAGNOSIS — S8002XA Contusion of left knee, initial encounter: Secondary | ICD-10-CM | POA: Diagnosis not present

## 2015-08-03 DIAGNOSIS — Z91040 Latex allergy status: Secondary | ICD-10-CM | POA: Insufficient documentation

## 2015-08-03 DIAGNOSIS — Y998 Other external cause status: Secondary | ICD-10-CM | POA: Diagnosis not present

## 2015-08-03 DIAGNOSIS — M25512 Pain in left shoulder: Secondary | ICD-10-CM

## 2015-08-03 DIAGNOSIS — Z7951 Long term (current) use of inhaled steroids: Secondary | ICD-10-CM | POA: Diagnosis not present

## 2015-08-03 DIAGNOSIS — I10 Essential (primary) hypertension: Secondary | ICD-10-CM | POA: Insufficient documentation

## 2015-08-03 DIAGNOSIS — E119 Type 2 diabetes mellitus without complications: Secondary | ICD-10-CM | POA: Diagnosis not present

## 2015-08-03 DIAGNOSIS — Z7901 Long term (current) use of anticoagulants: Secondary | ICD-10-CM | POA: Insufficient documentation

## 2015-08-03 NOTE — ED Provider Notes (Signed)
.CSN: [id number]     Arrival date & time 08/03/15  2036 History  This chart was scribed for Evelina Bucy, MD by Hansel Feinstein, ED Scribe. This patient was seen in room MHT13/MHT13 and the patient's care was started at 10:22 PM.     Chief Complaint  Patient presents with  . Knee Injury   Patient is a 74 y.o. female presenting with knee pain. The history is provided by the patient. No language interpreter was used.  Knee Pain Location:  Knee Time since incident:  6 days Injury: yes   Mechanism of injury: fall   Fall:    Fall occurred:  Walking and tripped   [employer] of impact:  Knees Knee location:  L knee Pain details:    Radiates to:  Does not radiate   Severity:  Moderate   Duration:  6 days Chronicity:  New  HPI Comments: Brandi Hunter is a 74 y.o. female with Hx of DM, PAF who presents to the Emergency Department complaining of an injury to the left knee that occurred 6 days ago after a mechanical fall at church. She states associated pain, ecchymosis, blister to the kneecap. She states she is ambulatory without difficulty. She also notes left shoulder pain from a separate fall that is unchanged today. Pt is right handed. Pt takes Eloquis qd. NKDA. She denies leg swelling, HA.   Past Medical History  Diagnosis Date  . Pacemaker     Implanted  by Dr [name] (MDT) 10/06/10  . Mobitz (type) II atrioventricular block   . HTN (hypertension)   . Obesity   . Hyperlipidemia   . DJD (degenerative joint disease)   . Asthma   . COPD (chronic obstructive pulmonary disease)   . Pancreatitis 2010 OR 2011    pt thought this had been ruled out  . Sleep apnea     CPA SETTING OF 14  . Diabetes mellitus without complication     TYPE 2  . GERD (gastroesophageal reflux disease)   . History of home oxygen therapy     oxygen 2 liters per nasal cannula at hs  . PAF (paroxysmal atrial fibrillation)   . Chronic anticoagulation    Past Surgical History  Procedure Laterality Date  . Insert /  replace / remove pacemaker  10/06/10    MDT  implanted by Dr [name]  . Total abdominal hysterectomy  1971  . Cystocele repair    . Ovary surgery      removal  . Repair rectocele    . Knee arthroscopy      both  . Cataract extraction w/phaco  11/02/2011    Procedure: CATARACT EXTRACTION PHACO AND INTRAOCULAR LENS PLACEMENT (IOC);  Surgeon: Williams Che;  Location: [facility];  Service: Ophthalmology;  Laterality: Right;  CDE=7.33  . Cataract extraction w/phaco  12/07/2011    Procedure: CATARACT EXTRACTION PHACO AND INTRAOCULAR LENS PLACEMENT (IOC);  Surgeon: Williams Che, MD;  Location: [facility];  Service: Ophthalmology;  Laterality: Left;  CDE 3.61  . Eye surgery  11/01/2012    BOTH EYES CATARACTS  . Tonsillectomy  AGE 41  . Colonoscopy with propofol N/A 08/09/2014    Procedure: COLONOSCOPY WITH PROPOFOL;  Surgeon: Juanita Craver, MD;  Location: [facility];  Service: Endoscopy;  Laterality: N/A;  . Esophagogastroduodenoscopy (egd) with propofol N/A 08/09/2014    Procedure: ESOPHAGOGASTRODUODENOSCOPY (EGD) WITH PROPOFOL;  Surgeon: Juanita Craver, MD;  Location: [facility];  Service: Endoscopy;  Laterality: N/A;  . Simple mastectomy  with axillary sentinel node biopsy Left 01/09/2015    Procedure: Irrigation and Drainage Abcess left Axilla;  Surgeon: Jackolyn Confer, MD;  Location: [facility];  Service: General;  Laterality: Left;   Family History  Problem Relation Age of Onset  . Diabetes Brother   . Heart disease Mother     CHF  . Heart disease Father     CHF  . Arthritis Sister     osteo  . Cancer Brother     prostate  . Anesthesia problems Neg Hx   . Hypotension Neg Hx   . Malignant hyperthermia Neg Hx   . Pseudochol deficiency Neg Hx    Social History  Substance Use Topics  . Smoking status: Current Some Day Smoker -- 0.10 packs/day for 50 years    Types: Cigarettes    Last Attempt to Quit: 07/17/2014  . Smokeless tobacco: Former Systems developer    Quit date: 11/17/2011  . Alcohol Use:  0.6 oz/week    1 Glasses of wine per week     Comment: couple glasses of wine occasionally-once a month   OB History    No data available     Review of Systems  Cardiovascular: Negative for leg swelling.  Musculoskeletal: Positive for arthralgias.  Skin: Positive for color change.  Neurological: Negative for headaches.  All other systems reviewed and are negative.  Allergies  Bee venom and Latex  Home Medications   Prior to Admission medications   Medication Sig Start Date End Date Taking? Authorizing Beula Joyner  albuterol (PROAIR HFA) 108 (90 BASE) MCG/ACT inhaler Inhale 2 puffs into the lungs every 4 (four) hours as needed for wheezing. Inhale 2 puffs into the lungs every 4 (four) hours as needed. 07/21/12   Historical Cassian Torelli, MD  amLODipine (NORVASC) 10 MG tablet TAKE ONE TABLET BY MOUTH ONCE DAILY 06/24/15   Thompson Grayer, MD  atorvastatin (LIPITOR) 40 MG tablet Take 40 mg by mouth daily.  03/17/13   Historical Madison Albea, MD  budesonide-formoterol (SYMBICORT) 160-4.5 MCG/ACT inhaler Inhale 2 puffs into the lungs 2 (two) times daily.    Historical Pinkey Mcjunkin, MD  Calcium Carbonate-Vitamin D (RA CALCIUM PLUS VITAMIN D) 600-400 MG-UNIT per tablet Take 2 tablets by mouth daily.      Historical Jens Siems, MD  citalopram (CELEXA) 40 MG tablet Take 40 mg by mouth at bedtime.     Historical Bryn Perkin, MD  Cyanocobalamin (VITAMIN B-12) 2000 MCG TBCR Take 1 tablet by mouth 2 (two) times daily. 07/21/12   Historical Dontay Harm, MD  ELIQUIS 5 MG TABS tablet TAKE ONE TABLET BY MOUTH TWICE DAILY 07/25/15   Thompson Grayer, MD  fenofibrate 160 MG tablet Take 160 mg by mouth every evening.    Historical Elie Gragert, MD  furosemide (LASIX) 20 MG tablet Take 20 mg by mouth every Monday, Wednesday, and Friday.     Historical Merwin Breden, MD  glipiZIDE (GLUCOTROL XL) 10 MG 24 hr tablet Take 10 mg by mouth 2 (two) times daily.  08/20/13   Historical Kannan Proia, MD  ipratropium-albuterol (DUONEB) 0.5-2.5 (3) MG/3ML SOLN Take 3 mLs  by nebulization 2 (two) times daily.    Historical Finola Rosal, MD  LORazepam (ATIVAN) 0.5 MG tablet Take 1 tablet by mouth every 6 (six) hours as needed for anxiety.  04/28/13   Historical Hermelinda Diegel, MD  losartan (COZAAR) 100 MG tablet Take 100 mg by mouth every evening.    Historical Danai Gotto, MD  metFORMIN (GLUCOPHAGE) 1000 MG tablet Take 1,000 mg by mouth 2 (two) times daily with  a meal.    Historical Armanie Ullmer, MD  metoprolol succinate (TOPROL-XL) 100 MG 24 hr tablet TAKE ONE TABLET BY MOUTH ONCE DAILY 05/16/15   Thompson Grayer, MD  oxybutynin (DITROPAN-XL) 10 MG 24 hr tablet Take 10 mg by mouth daily.  07/19/13   Historical Zahriah Roes, MD  pantoprazole (PROTONIX) 40 MG tablet Take 40 mg by mouth daily.  08/21/13   Historical Ericson Nafziger, MD  Probiotic Product (ALIGN PO) Take 1 capsule by mouth daily.     Historical Lamesha Tibbits, MD  spironolactone (ALDACTONE) 25 MG tablet TAKE ONE-HALF TABLET BY MOUTH ONCE DAILY (NEEDS FOLLOW UP APPOINTMENT FOR ANY REFILLS) 06/24/15   Thompson Grayer, MD   BP 157/83 mmHg  Pulse 92  Temp(Src) 98.6 F (37 C) (Oral)  Resp 18  Ht 5\' 6"  (1.676 m)  Wt 252 lb (114.306 kg)  BMI 40.69 kg/m2  SpO2 95% Physical Exam  Constitutional: She is oriented to person, place, and time. She appears well-developed and well-nourished. No distress.  HENT:  Head: Normocephalic and atraumatic.  Mouth/Throat: Oropharynx is clear and moist.  Eyes: EOM are normal. Pupils are equal, round, and reactive to light.  Neck: Normal range of motion. Neck supple.  Cardiovascular: Normal rate and regular rhythm.  Exam reveals no friction rub.   No murmur heard. Pulmonary/Chest: Effort normal and breath sounds normal. No respiratory distress. She has no wheezes. She has no rales.  Abdominal: Soft. She exhibits no distension. There is no tenderness. There is no rebound.  Musculoskeletal: Normal range of motion. She exhibits edema.       Left shoulder: Normal.       Legs: Neurological: She is alert and oriented  to person, place, and time.  Skin: She is not diaphoretic.  Nursing note and vitals reviewed.   ED Course  Procedures (including critical care time) DIAGNOSTIC STUDIES: Oxygen Saturation is 95% on RA, adequate by my interpretation.    COORDINATION OF CARE: 10:27 PM Discussed treatment plan with pt at bedside and pt agreed to plan.   Labs Review Labs Reviewed - No data to display  Imaging Review No results found. I have personally reviewed and evaluated these images and lab results as part of my medical decision-making.   EKG Interpretation None      MDM   Final diagnoses:  Traumatic hematoma of knee, left, initial encounter  Left shoulder pain    74 year old female presents with the injury. [place] on it one week ago, has had a blood blister on her knee since it is oozing some blood. The blister was unroofed tonight and was sent to the ED by nurses at her church. She has diffuse bruising on her left upper thigh and left calf with trace edema in both legs. Will ultrasound for DVT since she is bruising and is mildly swollen, but she denies any calf pain. Also x-ray her knee and her left shoulder because she is complaining of pain in both. The shoulder has full range of motion without any palpable tenderness but she states it hurts when she moves it.  Imaging normal. No sign of DVT. No broken bones. Stable for discharge. Place on Keflex for that hematoma ulcer as there is one small area of yellowish base. On re-exam, lower leg is warm and has some mild redness, will add bactrim to cover for possible MRSA cellulitis.   I personally performed the services described in this documentation, which was scribed in my presence. The recorded information has been reviewed and is accurate.  Evelina Bucy, MD 08/04/15 [phone]

## 2015-08-03 NOTE — ED Notes (Signed)
Pt fell and injured her left knee last Sunday.  This Thursday, a hematoma on her left knee post fall blistered and opened.  Blister has been oozing with small amount of blood since Thursday.  Pt diabetic and has been treating at home with antibiotic ointment and gauze dressings.

## 2015-08-04 MED ORDER — CEPHALEXIN 500 MG PO CAPS: 500.0000 mg | ORAL_CAPSULE | Freq: Three times a day (TID) | ORAL | Status: DC

## 2015-08-04 MED ORDER — SULFAMETHOXAZOLE-TRIMETHOPRIM 800-160 MG PO TABS: 1.0000 | ORAL_TABLET | Freq: Two times a day (BID) | ORAL | Status: AC

## 2015-08-04 NOTE — Discharge Instructions (Signed)

## 2015-08-13 ENCOUNTER — Encounter: Payer: Self-pay | Admitting: Cardiology

## 2015-08-20 ENCOUNTER — Encounter: Payer: Self-pay | Admitting: Internal Medicine

## 2015-08-30 LAB — CUP PACEART REMOTE DEVICE CHECK
Battery Remaining Longevity: 8
Brady Statistic AS VP Percent: 22.7 %
Brady Statistic AS VS Percent: 9.8 %
Implantable Lead Implant Date: 20111121
Implantable Lead Location: 753859
Implantable Lead Model: 4470
Implantable Lead Serial Number: 687643
Lead Channel Impedance Value: 454 Ohm
Lead Channel Impedance Value: 516 Ohm
Lead Channel Pacing Threshold Amplitude: 0.75 V
Lead Channel Pacing Threshold Pulse Width: 0.4 ms
Lead Channel Setting Pacing Pulse Width: 0.4 ms
MDC IDC LEAD IMPLANT DT: 20111121
MDC IDC LEAD LOCATION: 753860
MDC IDC LEAD SERIAL: 548226
MDC IDC MSMT BATTERY IMPEDANCE: 277 Ohm
MDC IDC MSMT BATTERY VOLTAGE: 2.78 V
MDC IDC SESS DTM: 20161014152513
MDC IDC SET LEADCHNL RA PACING AMPLITUDE: 2 V
MDC IDC SET LEADCHNL RV PACING AMPLITUDE: 2.5 V
MDC IDC SET LEADCHNL RV SENSING SENSITIVITY: 4 mV
MDC IDC STAT BRADY AP VP PERCENT: 67.4 %
MDC IDC STAT BRADY AP VS PERCENT: 0.1 % — AB

## 2015-09-02 ENCOUNTER — Other Ambulatory Visit: Payer: Self-pay | Admitting: Internal Medicine

## 2015-09-27 ENCOUNTER — Ambulatory Visit: Payer: Medicare Other | Admitting: Pulmonary Disease

## 2015-09-30 ENCOUNTER — Encounter: Payer: Self-pay | Admitting: Pulmonary Disease

## 2015-09-30 ENCOUNTER — Telehealth: Payer: Self-pay | Admitting: Pulmonary Disease

## 2015-09-30 ENCOUNTER — Ambulatory Visit (INDEPENDENT_AMBULATORY_CARE_PROVIDER_SITE_OTHER): Payer: Medicare Other | Admitting: Pulmonary Disease

## 2015-09-30 VITALS — BP 118/82 | HR 74 | Ht 66.0 in | Wt 256.4 lb

## 2015-09-30 DIAGNOSIS — F17210 Nicotine dependence, cigarettes, uncomplicated: Secondary | ICD-10-CM

## 2015-09-30 DIAGNOSIS — Z9989 Dependence on other enabling machines and devices: Principal | ICD-10-CM

## 2015-09-30 DIAGNOSIS — G4733 Obstructive sleep apnea (adult) (pediatric): Secondary | ICD-10-CM

## 2015-09-30 DIAGNOSIS — J449 Chronic obstructive pulmonary disease, unspecified: Secondary | ICD-10-CM

## 2015-09-30 DIAGNOSIS — E662 Morbid (severe) obesity with alveolar hypoventilation: Secondary | ICD-10-CM | POA: Diagnosis not present

## 2015-09-30 DIAGNOSIS — J9610 Chronic respiratory failure, unspecified whether with hypoxia or hypercapnia: Secondary | ICD-10-CM

## 2015-09-30 DIAGNOSIS — J45909 Unspecified asthma, uncomplicated: Secondary | ICD-10-CM

## 2015-09-30 DIAGNOSIS — Z720 Tobacco use: Secondary | ICD-10-CM | POA: Diagnosis not present

## 2015-09-30 NOTE — Patient Instructions (Signed)
Follow up in 1 year.

## 2015-09-30 NOTE — Telephone Encounter (Signed)
Referral given to [place]

## 2015-09-30 NOTE — Telephone Encounter (Signed)
Pt called stating that the order we sent earlier today for new CPAP was sent to the wrong DME Order was supposed to be sent to [facility] not [facility]. Pt's services were transferred to [facility] from [facility] d/t redistricting.  Will send to [facility] to ensure that order to [facility] is cancelled.

## 2015-09-30 NOTE — Progress Notes (Signed)
Chief Complaint  Patient presents with  . Follow-up    Pt following for OSA: Pt states she is doing well. Pt using CPAP every night for about 8 - 10 hours. pt states she needs a new mask and she doesnt sleep as well as she did when she first got the CPAP in 2010. DME: [facility]     History of Present Illness: Brandi Hunter is a 74 y.o. female smoker with chronic obstructive asthma, and OSA/OHS.  She was previously followed by Dr. Gwenette Greet.  She uses CPAP with 2 liters oxygen nightly.  She wakes up twice to use the bathroom.  She does not have issues with her mask fit.  She is using same device for last 6 years.  She will nap during the day, and uses her CPAP then.  She still smokes intermittently.  She is working with her son to quit together.  She has intermittent dry cough >> more when she smokes.  She denies sinus congestion, or sore throat.  She uses symbicort and duoneb bid.  Her use of proair is sporadic.    She denies chest pain or fever.  She does get ankle swelling >> better when she keeps her feet up.  She did get her flu shot already.   TESTS: PSG 11/18/08 >> AHI 9 PFT 09/27/12 >> FEV1 1.81 (79%), FEV1% 65, TLC 4.64 (86%), DLCO 77%, no BD Echo 05/01/14 >> EF 60 to 123456, grade 1 diastolic dysfx CT chest 99991111 >> 5 mm nodule Rt lower lung stable since 2008 Auto CPAP 08/17/15 to 09/29/15 >> used on 44 of 44 nights with average 10 hrs and 44 min.  Average AHI is 2.7 with median CPAP 11 cm H2O and 95 th percentile CPAP 13 cm H20.   PMhx >> Mobitz II s/p PM, HTN, HLD, PAF, DJD, Pancreatitis, DM, GERD  Past surgical hx, Allergies, Family hx, Social hx all reviewed.   Physical Exam: BP 118/82 mmHg  Pulse 74  Ht 5\' 6"  (1.676 m)  Wt 256 lb 6.4 oz (116.302 kg)  BMI 41.40 kg/m2  SpO2 96%  General - No distress ENT - No sinus tenderness, no oral exudate, no LAN, decreased AP diameter Cardiac - s1s2 regular, no murmur Chest - No wheeze/rales/dullness Back - No focal  tenderness Abd - Soft, non-tender Ext - No edema Neuro - Normal strength Skin - No rashes Psych - normal mood, and behavior   Assessment/Plan:  Obstructive sleep apnea. She is compliant with CPAP and reports benefit. Plan: - will arrange for new CPAP machine since her device is more that 72 years old - continue auto CPAP  Chronic respiratory failure with obesity hypoventilation syndrome. Plan: - continue 2 liters oxygen at night with CPAP - discussed importance of weight loss  COPD with asthma. Plan: - continue symbicort, duoneb, and proair  Tobacco abuse. Plan: - discussed importance of smoking cessation, and reviewed options to help quit - she will continue to decrease cigarette use on her own   Medication Sig  . albuterol (PROAIR HFA) 108 (90 BASE) MCG/ACT inhaler Inhale 2 puffs into the lungs every 4 (four) hours as needed for wheezing. Inhale 2 puffs into the lungs every 4 (four) hours as needed.  Marland Kitchen amLODipine (NORVASC) 10 MG tablet TAKE ONE TABLET BY MOUTH ONCE DAILY  . atorvastatin (LIPITOR) 40 MG tablet Take 40 mg by mouth daily.   . budesonide-formoterol (SYMBICORT) 160-4.5 MCG/ACT inhaler Inhale 2 puffs into the lungs 2 (two) times  daily.  . citalopram (CELEXA) 40 MG tablet Take 40 mg by mouth at bedtime.   Marland Kitchen ELIQUIS 5 MG TABS tablet TAKE ONE TABLET BY MOUTH TWICE DAILY  . fenofibrate 160 MG tablet Take 160 mg by mouth every evening.  . furosemide (LASIX) 20 MG tablet Take 20 mg by mouth every Monday, Wednesday, and Friday.   Marland Kitchen glipiZIDE (GLUCOTROL XL) 10 MG 24 hr tablet Take 10 mg by mouth 2 (two) times daily.   Marland Kitchen ipratropium-albuterol (DUONEB) 0.5-2.5 (3) MG/3ML SOLN Take 3 mLs by nebulization 2 (two) times daily.  Marland Kitchen LORazepam (ATIVAN) 0.5 MG tablet Take 1 tablet by mouth every 6 (six) hours as needed for anxiety.   Marland Kitchen losartan (COZAAR) 100 MG tablet Take 100 mg by mouth every evening.  . metFORMIN (GLUCOPHAGE) 1000 MG tablet Take 1,000 mg by mouth 2 (two) times  daily with a meal.  . metoprolol succinate (TOPROL-XL) 100 MG 24 hr tablet TAKE ONE TABLET BY MOUTH ONCE DAILY  . oxybutynin (DITROPAN-XL) 10 MG 24 hr tablet Take 10 mg by mouth daily.   . pantoprazole (PROTONIX) 40 MG tablet Take 40 mg by mouth daily.   . Probiotic Product (ALIGN PO) Take 1 capsule by mouth daily.   Marland Kitchen spironolactone (ALDACTONE) 25 MG tablet TAKE ONE-HALF TABLET BY MOUTH ONCE DAILY (NEEDS FOLLOW UP APPOINTMENT FOR ANY REFILLS)  . Calcium Carbonate-Vitamin D (RA CALCIUM PLUS VITAMIN D) 600-400 MG-UNIT per tablet Take 2 tablets by mouth daily.       Chesley Mires, MD [facility] Pager:  [phone]

## 2015-10-01 DIAGNOSIS — M25512 Pain in left shoulder: Secondary | ICD-10-CM

## 2015-10-01 DIAGNOSIS — L853 Xerosis cutis: Secondary | ICD-10-CM | POA: Insufficient documentation

## 2015-10-01 DIAGNOSIS — G8929 Other chronic pain: Secondary | ICD-10-CM | POA: Insufficient documentation

## 2015-10-23 ENCOUNTER — Other Ambulatory Visit: Payer: Self-pay

## 2015-10-23 ENCOUNTER — Encounter: Payer: Self-pay | Admitting: Internal Medicine

## 2015-10-23 ENCOUNTER — Ambulatory Visit (INDEPENDENT_AMBULATORY_CARE_PROVIDER_SITE_OTHER): Payer: Medicare Other | Admitting: Internal Medicine

## 2015-10-23 VITALS — BP 142/82 | HR 75 | Ht 66.0 in | Wt 256.6 lb

## 2015-10-23 DIAGNOSIS — I10 Essential (primary) hypertension: Secondary | ICD-10-CM | POA: Diagnosis not present

## 2015-10-23 DIAGNOSIS — I480 Paroxysmal atrial fibrillation: Secondary | ICD-10-CM

## 2015-10-23 DIAGNOSIS — I441 Atrioventricular block, second degree: Secondary | ICD-10-CM

## 2015-10-23 NOTE — Progress Notes (Signed)
[id number], FNP  The patient presents today for electrophysiology followup.  Since last being seen in our clinic, the patient reports doing very well.  Today, she denies symptoms of palpitations, chest pain, shortness of breath, orthopnea, PND, dizziness, presyncope, further syncope, or neurologic sequela.  She has had no recent AFib. The patient feels that she is tolerating medications without difficulties and is otherwise without complaint today.   Past Medical History  Diagnosis Date  . Pacemaker     Implanted  by Dr [name] (MDT) 10/06/10  . Mobitz (type) II atrioventricular block   . HTN (hypertension)   . Obesity   . Hyperlipidemia   . DJD (degenerative joint disease)   . Asthma   . COPD (chronic obstructive pulmonary disease) ([place])   . Pancreatitis 2010 OR 2011    pt thought this had been ruled out  . Sleep apnea     CPA SETTING OF 14  . Diabetes mellitus without complication ([place])     TYPE 2  . GERD (gastroesophageal reflux disease)   . History of home oxygen therapy     oxygen 2 liters per nasal cannula at hs  . PAF (paroxysmal atrial fibrillation) ([place])   . Chronic anticoagulation    Past Surgical History  Procedure Laterality Date  . Insert / replace / remove pacemaker  10/06/10    MDT  implanted by Dr [name]  . Total abdominal hysterectomy  1971  . Cystocele repair    . Ovary surgery      removal  . Repair rectocele    . Knee arthroscopy      both  . Cataract extraction w/phaco  11/02/2011    Procedure: CATARACT EXTRACTION PHACO AND INTRAOCULAR LENS PLACEMENT (IOC);  Surgeon: Williams Che;  Location: [facility];  Service: Ophthalmology;  Laterality: Right;  CDE=7.33  . Cataract extraction w/phaco  12/07/2011    Procedure: CATARACT EXTRACTION PHACO AND INTRAOCULAR LENS PLACEMENT (IOC);  Surgeon: Williams Che, MD;  Location: [facility];  Service: Ophthalmology;  Laterality: Left;  CDE 3.61  . Eye surgery  11/01/2012    BOTH EYES CATARACTS  .  Tonsillectomy  AGE 28  . Colonoscopy with propofol N/A 08/09/2014    Procedure: COLONOSCOPY WITH PROPOFOL;  Surgeon: Juanita Craver, MD;  Location: [facility];  Service: Endoscopy;  Laterality: N/A;  . Esophagogastroduodenoscopy (egd) with propofol N/A 08/09/2014    Procedure: ESOPHAGOGASTRODUODENOSCOPY (EGD) WITH PROPOFOL;  Surgeon: Juanita Craver, MD;  Location: [facility];  Service: Endoscopy;  Laterality: N/A;  . Simple mastectomy with axillary sentinel node biopsy Left 01/09/2015    Procedure: Irrigation and Drainage Abcess left Axilla;  Surgeon: Jackolyn Confer, MD;  Location: [facility];  Service: General;  Laterality: Left;    Current Outpatient Prescriptions  Medication Sig Dispense Refill  . albuterol (PROAIR HFA) 108 (90 BASE) MCG/ACT inhaler Inhale 2 puffs into the lungs every 4 (four) hours as needed for wheezing. Inhale 2 puffs into the lungs every 4 (four) hours as needed.    Marland Kitchen amLODipine (NORVASC) 10 MG tablet TAKE ONE TABLET BY MOUTH ONCE DAILY 90 tablet 1  . atorvastatin (LIPITOR) 40 MG tablet Take 40 mg by mouth daily.     . budesonide-formoterol (SYMBICORT) 160-4.5 MCG/ACT inhaler Inhale 2 puffs into the lungs 2 (two) times daily.    . Calcium Carbonate-Vitamin D (RA CALCIUM PLUS VITAMIN D) 600-400 MG-UNIT per tablet Take 2 tablets by mouth daily.      . citalopram (CELEXA) 40 MG tablet Take  40 mg by mouth at bedtime.     Marland Kitchen ELIQUIS 5 MG TABS tablet TAKE ONE TABLET BY MOUTH TWICE DAILY 60 tablet 1  . fenofibrate 160 MG tablet Take 160 mg by mouth every evening.    . furosemide (LASIX) 20 MG tablet Take 20 mg by mouth every Monday, Wednesday, and Friday.     Marland Kitchen glipiZIDE (GLUCOTROL XL) 10 MG 24 hr tablet Take 10 mg by mouth 2 (two) times daily.     Marland Kitchen ipratropium-albuterol (DUONEB) 0.5-2.5 (3) MG/3ML SOLN Take 3 mLs by nebulization 2 (two) times daily.    Marland Kitchen LORazepam (ATIVAN) 0.5 MG tablet Take 1 tablet by mouth every 6 (six) hours as needed for anxiety.     Marland Kitchen losartan (COZAAR) 100 MG  tablet Take 100 mg by mouth every evening.    . metFORMIN (GLUCOPHAGE) 1000 MG tablet Take 1,000 mg by mouth 2 (two) times daily with a meal.    . metoprolol succinate (TOPROL-XL) 100 MG 24 hr tablet TAKE ONE TABLET BY MOUTH ONCE DAILY 90 tablet 2  . oxybutynin (DITROPAN-XL) 10 MG 24 hr tablet Take 10 mg by mouth daily.     . pantoprazole (PROTONIX) 40 MG tablet Take 40 mg by mouth daily.     . Probiotic Product (ALIGN PO) Take 1 capsule by mouth daily.     Marland Kitchen spironolactone (ALDACTONE) 25 MG tablet Take 12.5 mg by mouth daily.     No current facility-administered medications for this visit.    Allergies  Allergen Reactions  . Bee Venom Swelling  . Latex Swelling    LATEX CATHETERS    Social History   Social History  . Marital Status: Widowed    Spouse Name: N/A  . Number of Children: 3  . Years of Education: College   Occupational History  . Part time receptionist    Social History Main Topics  . Smoking status: Current Some Day Smoker -- 0.10 packs/day for 50 years    Types: Cigarettes    Last Attempt to Quit: 07/17/2014  . Smokeless tobacco: Former Systems developer    Quit date: 11/17/2011  . Alcohol Use: 0.6 oz/week    1 Glasses of wine per week     Comment: couple glasses of wine occasionally-once a month  . Drug Use: No  . Sexual Activity: Not on file   Other Topics Concern  . Not on file   Social History Narrative   Patient lives at home alone.   Caffeine Use: 16oz drink daily    Family History  Problem Relation Age of Onset  . Diabetes Brother   . Heart disease Mother     CHF  . Heart disease Father     CHF  . Arthritis Sister     osteo  . Cancer Brother     prostate  . Anesthesia problems Neg Hx   . Hypotension Neg Hx   . Malignant hyperthermia Neg Hx   . Pseudochol deficiency Neg Hx      Physical Exam: Filed Vitals:   10/23/15 0926  BP: 142/82  Pulse: 75  Height: 5\' 6"  (1.676 m)  Weight: 256 lb 9.6 oz (116.393 kg)    GEN- The patient is well  appearing, alert and oriented x 3 today.   Head- normocephalic, atraumatic Eyes-  Sclera clear, conjunctiva pink Ears- hearing intact Oropharynx- clear Neck- supple, no JVP Lymph- no cervical lymphadenopathy Lungs- Clear to ausculation bilaterally, normal work of breathing Chest- pacemaker pocket is well healed  Heart- Regular rate and rhythm, no murmurs, rubs or gallops, PMI not laterally displaced GI- soft, NT, ND, + BS Extremities- no clubbing, cyanosis, or edema  Pacemaker interrogation- reviewed in detail today,  See PACEART report  Assessment and Plan:  1. Mobitz II second degree AV block Normal pacemaker function See Pace Art report No changes today  2. HTN Stable No change required today  3. Obesity Weight loss is advised Body mass index is 41.44 kg/(m^2).  4. Tobacco I have strongly encouraged her to quit  5. afib On eliquis Maintaining sinus Labs by PCP per patient  Return to see Truitt Merle in 6 months I will see again in 1 year [facility]  Thompson Grayer MD, [facility] 10/23/2015 9:51 AM

## 2015-10-23 NOTE — Patient Instructions (Signed)
Medication Instructions:  Your physician recommends that you continue on your current medications as directed. Please refer to the Current Medication list given to you today.   Labwork: None ordered   Testing/Procedures: None ordered   Follow-Up:Your physician wants you to follow-up in: 6 months with Truitt Merle, NP and 12 months with Dr Vallery Ridge will receive a reminder letter in the mail two months in advance. If you don't receive a letter, please call our office to schedule the follow-up appointment.  Remote monitoring is used to monitor your Pacemaker  from home. This monitoring reduces the number of office visits required to check your device to one time per year. It allows [place] to keep an eye on the functioning of your device to ensure it is working properly. You are scheduled for a device check from home on 01/22/16. You may send your transmission at any time that day. If you have a wireless device, the transmission will be sent automatically. After your physician reviews your transmission, you will receive a postcard with your next transmission date.     Any Other Special Instructions Will Be Listed Below (If Applicable).     If you need a refill on your cardiac medications before your next appointment, please call your pharmacy.

## 2015-11-05 ENCOUNTER — Ambulatory Visit: Payer: Medicare Other | Attending: Physical Medicine and Rehabilitation | Admitting: Physical Therapy

## 2015-11-05 DIAGNOSIS — M5441 Lumbago with sciatica, right side: Secondary | ICD-10-CM

## 2015-11-05 NOTE — Therapy (Signed)
[facility] [place], [place], [address] Phone: [phone]   Fax:  [phone]  Physical Therapy Evaluation  Patient Details  Name: Brandi Hunter MRN: [id number] Date of Birth: [birth year] Referring Provider: Suella Broad MD.  Encounter Date: 11/05/2015      PT End of Session - 11/05/15 1335    Visit Number 1   Number of Visits 12   Date for PT Re-Evaluation 12/17/15   PT Start Time 0953   PT Stop Time 1039   PT Time Calculation (min) 46 min   Activity Tolerance Patient tolerated treatment well   Behavior During Therapy [facility] for tasks assessed/performed      Past Medical History  Diagnosis Date  . Pacemaker     Implanted  by Dr [name] (MDT) 10/06/10  . Mobitz (type) II atrioventricular block   . HTN (hypertension)   . Obesity   . Hyperlipidemia   . DJD (degenerative joint disease)   . Asthma   . COPD (chronic obstructive pulmonary disease) ([place])   . Pancreatitis 2010 OR 2011    pt thought this had been ruled out  . Sleep apnea     CPA SETTING OF 14  . Diabetes mellitus without complication ([place])     TYPE 2  . GERD (gastroesophageal reflux disease)   . History of home oxygen therapy     oxygen 2 liters per nasal cannula at hs  . PAF (paroxysmal atrial fibrillation) ([place])   . Chronic anticoagulation     Past Surgical History  Procedure Laterality Date  . Insert / replace / remove pacemaker  10/06/10    MDT  implanted by Dr [name]  . Total abdominal hysterectomy  1971  . Cystocele repair    . Ovary surgery      removal  . Repair rectocele    . Knee arthroscopy      both  . Cataract extraction w/phaco  11/02/2011    Procedure: CATARACT EXTRACTION PHACO AND INTRAOCULAR LENS PLACEMENT (IOC);  Surgeon: Williams Che;  Location: [facility];  Service: Ophthalmology;  Laterality: Right;  CDE=7.33  . Cataract extraction w/phaco  12/07/2011    Procedure: CATARACT EXTRACTION PHACO AND INTRAOCULAR LENS PLACEMENT  (IOC);  Surgeon: Williams Che, MD;  Location: [facility];  Service: Ophthalmology;  Laterality: Left;  CDE 3.61  . Eye surgery  11/01/2012    BOTH EYES CATARACTS  . Tonsillectomy  AGE 74  . Colonoscopy with propofol N/A 08/09/2014    Procedure: COLONOSCOPY WITH PROPOFOL;  Surgeon: Juanita Craver, MD;  [facility];  Service: Endoscopy;  Laterality: N/A;  . Esophagogastroduodenoscopy (egd) with propofol N/A 08/09/2014    Procedure: ESOPHAGOGASTRODUODENOSCOPY (EGD) WITH PROPOFOL;  Surgeon: Juanita Craver, MD;  [facility];  Service: Endoscopy;  Laterality: N/A;  . Simple mastectomy with axillary sentinel node biopsy Left 01/09/2015    Procedure: Irrigation and Drainage Abcess left Axilla;  Surgeon: Jackolyn Confer, MD;  Location: [facility];  Service: General;  Laterality: Left;    There were no vitals filed for this visit.  Visit Diagnosis:  Right-sided low back pain with right-sided sciatica - Plan: PT plan of care cert/re-cert      Subjective Assessment - 11/05/15 0958    Subjective Woke up one morning wih pain.   Patient Stated Goals Get out of pain.   Pain Score 3    Pain Location Back   Pain Orientation Left   Pain Descriptors / Indicators Aching  Pain Type Acute pain   Pain Frequency Constant            [facility] PT Assessment - Nov 18, 2015 0001    Assessment   Medical Diagnosis Degenerative lumbar disc.   Referring Provider Suella Broad MD.   Onset Date/Surgical Date --  2 weeks.   Precautions   Precautions ICD/Pacemaker   Restrictions   Weight Bearing Restrictions --   Balance Screen   Has the patient fallen in the past 6 months Yes   How many times? 1   Has the patient had a decrease in activity level because of a fear of falling?  Yes   Is the patient reluctant to leave their home because of a fear of falling?  No   Home Environment   Living Environment Private residence   Prior Function   Level of Independence Independent   Posture/Postural Control    Posture/Postural Control Postural limitations   Postural Limitations Rounded Shoulders;Forward head;Decreased lumbar lordosis   Posture Comments Mild thoracic scoliosis.   ROM / Strength   AROM / PROM / Strength AROM;Strength   AROM   Overall AROM Comments Lumbar intervertebral flexion is limited by 65% and her active lumbar extension is to 0 degrees.   Strength   Overall Strength Comments Normal bilateral LE strength.   Palpation   Palpation comment Very tender at L5-S1 and right SIJ region.   Special Tests    Special Tests Lumbar;Sacrolliac Tests;Leg LengthTest   Lumbar Tests --  Absent RT Ach DTR; pain with RT SLR.   Sacroiliac Tests  --  Positive RT FABER test.   Leg length test  --  Equal leg lengths.                   [facility] Adult PT Treatment/Exercise - November 18, 2015 0001    Modalities   Modalities Traction   Traction   Type of Traction Lumbar   Min (lbs) 5   Max (lbs) 80   Hold Time 99   Rest Time 5   Time 15                  PT Short Term Goals - 11-18-2015 1649    PT SHORT TERM GOAL #1   Title Ind with HEP.   Time 2   Period Weeks   Status New           PT Long Term Goals - 11-18-2015 1650    PT LONG TERM GOAL #1   Title Perform ADL's with pain not > 3/10.   Time 6   Period Weeks   Status New   PT LONG TERM GOAL #2   Title Eliminate right LE pain.   Time 6   Period Weeks               Plan - 11/18/15 1648    PT Treatment/Interventions Patient/family education;Traction;Therapeutic activities;Therapeutic exercise   PT Next Visit Plan Int traction at 90# (max 110-115#); Core exercises.          G-Codes - 2015/11/18 1336    Functional Assessment Tool Used FOTO.   Functional Limitation Mobility: Walking and moving around   Mobility: Walking and Moving Around Current Status [phone]) At least 40 percent but less than 60 percent impaired, limited or restricted   Mobility: Walking and Moving Around Goal Status [phone]) At least 20  percent but less than 40 percent impaired, limited or restricted       Problem List Patient Active  Problem List   Diagnosis Date Noted  . Cellulitis of left axilla   . Abscess of axilla, left 01/07/2015  . Paroxysmal atrial fibrillation ([place]) 05/24/2014  . Abnormality of gait 03/29/2013  . Diabetic neuropathy ([place]) 03/29/2013  . OSA (obstructive sleep apnea) 08/26/2012  . Chronic asthmatic bronchitis ([place]) 08/26/2012  . Preop cardiovascular exam 08/15/2012  . IBS (irritable bowel syndrome) 05/27/2011  . Mobitz type II atrioventricular block 02/09/2011  . HTN (hypertension) 02/09/2011  . Obesity 02/09/2011    Aikam Hellickson, [place] MPT 11/05/2015, 4:54 PM  [facility] [address] [place], [place], [address] Phone: [phone]   Fax:  [phone]  Name: Brandi Hunter MRN: [id number] Date of Birth: [birth year]

## 2015-11-12 ENCOUNTER — Other Ambulatory Visit: Payer: Self-pay | Admitting: Internal Medicine

## 2015-11-13 ENCOUNTER — Encounter: Payer: Self-pay | Admitting: Physical Therapy

## 2015-11-13 ENCOUNTER — Ambulatory Visit: Payer: Medicare Other | Admitting: Physical Therapy

## 2015-11-13 DIAGNOSIS — M5441 Lumbago with sciatica, right side: Secondary | ICD-10-CM | POA: Diagnosis not present

## 2015-11-13 LAB — CUP PACEART INCLINIC DEVICE CHECK
Brady Statistic AP VS Percent: 0.1 %
Brady Statistic AS VP Percent: 17.4 %
Implantable Lead Implant Date: 20111121
Implantable Lead Implant Date: 20111121
Implantable Lead Location: 753859
Implantable Lead Model: 4469
Implantable Lead Serial Number: 548226
Lead Channel Pacing Threshold Amplitude: 0.75 V
Lead Channel Sensing Intrinsic Amplitude: 2.8 mV
Lead Channel Setting Pacing Amplitude: 2 V
Lead Channel Setting Pacing Pulse Width: 0.4 ms
MDC IDC LEAD LOCATION: 753860
MDC IDC LEAD SERIAL: 687643
MDC IDC MSMT LEADCHNL RA PACING THRESHOLD AMPLITUDE: 0.5 V
MDC IDC MSMT LEADCHNL RA PACING THRESHOLD PULSEWIDTH: 0.4 ms
MDC IDC MSMT LEADCHNL RV PACING THRESHOLD PULSEWIDTH: 0.4 ms
MDC IDC MSMT LEADCHNL RV SENSING INTR AMPL: 15.68 mV
MDC IDC SESS DTM: 20161228155433
MDC IDC SET LEADCHNL RV PACING AMPLITUDE: 2.5 V
MDC IDC SET LEADCHNL RV SENSING SENSITIVITY: 4 mV
MDC IDC STAT BRADY AP VP PERCENT: 72 %
MDC IDC STAT BRADY AS VS PERCENT: 10.6 %

## 2015-11-13 NOTE — Therapy (Signed)
[facility] [place], [place], [address] Phone: [phone]   Fax:  [phone]  Physical Therapy Treatment  Patient Details  Name: Brandi Hunter MRN: [id number] Date of Birth: Aug 10, 1941 Referring Provider: Suella Broad MD.  Encounter Date: 11/13/2015      PT End of Session - 11/13/15 0816    Visit Number 2   Number of Visits 12   Date for PT Re-Evaluation 12/17/15   PT Start Time 0817   PT Stop Time 0903   PT Time Calculation (min) 46 min   Activity Tolerance Patient tolerated treatment well   Behavior During Therapy [facility] for tasks assessed/performed      Past Medical History  Diagnosis Date  . Pacemaker     Implanted  by Dr [name] (MDT) 10/06/10  . Mobitz (type) II atrioventricular block   . HTN (hypertension)   . Obesity   . Hyperlipidemia   . DJD (degenerative joint disease)   . Asthma   . COPD (chronic obstructive pulmonary disease) ([place])   . Pancreatitis 2010 OR 2011    pt thought this had been ruled out  . Sleep apnea     CPA SETTING OF 14  . Diabetes mellitus without complication ([place])     TYPE 2  . GERD (gastroesophageal reflux disease)   . History of home oxygen therapy     oxygen 2 liters per nasal cannula at hs  . PAF (paroxysmal atrial fibrillation) ([place])   . Chronic anticoagulation     Past Surgical History  Procedure Laterality Date  . Insert / replace / remove pacemaker  10/06/10    MDT  implanted by Dr [name]  . Total abdominal hysterectomy  1971  . Cystocele repair    . Ovary surgery      removal  . Repair rectocele    . Knee arthroscopy      both  . Cataract extraction w/phaco  11/02/2011    Procedure: CATARACT EXTRACTION PHACO AND INTRAOCULAR LENS PLACEMENT (IOC);  Surgeon: Williams Che;  [facility]: [facility];  Service: Ophthalmology;  Laterality: Right;  CDE=7.33  . Cataract extraction w/phaco  12/07/2011    Procedure: CATARACT EXTRACTION PHACO AND INTRAOCULAR LENS PLACEMENT  (IOC);  Surgeon: Williams Che, MD;  Location: [facility];  Service: Ophthalmology;  Laterality: Left;  CDE 3.61  . Eye surgery  11/01/2012    BOTH EYES CATARACTS  . Tonsillectomy  AGE 3  . Colonoscopy with propofol N/A 08/09/2014    Procedure: COLONOSCOPY WITH PROPOFOL;  Surgeon: Juanita Craver, MD;  [facility];  Service: Endoscopy;  Laterality: N/A;  . Esophagogastroduodenoscopy (egd) with propofol N/A 08/09/2014    Procedure: ESOPHAGOGASTRODUODENOSCOPY (EGD) WITH PROPOFOL;  Surgeon: Juanita Craver, MD;  [facility];  Service: Endoscopy;  Laterality: N/A;  . Simple mastectomy with axillary sentinel node biopsy Left 01/09/2015    Procedure: Irrigation and Drainage Abcess left Axilla;  Surgeon: Jackolyn Confer, MD;  [facility];  Service: General;  Laterality: Left;    There were no vitals filed for this visit.  Visit Diagnosis:  Right-sided low back pain with right-sided sciatica      Subjective Assessment - 11/13/15 0815    Subjective Reports a burning sensation just superior to R SI joint yesterday. Still cannot stand for long periods of time per patient report.   Patient Stated Goals Get out of pain.   Currently in Pain? Yes   Pain Score 2    Pain Location  Back   Pain Orientation Right   Pain Descriptors / Indicators Aching   Pain Type Acute pain            [facility] PT Assessment - 11/13/15 0001    Assessment   Medical Diagnosis Degenerative lumbar disc.   Precautions   Precautions ICD/Pacemaker                     [facility] Adult PT Treatment/Exercise - 11/13/15 0001    Exercises   Exercises Lumbar   Lumbar Exercises: Stretches   Active Hamstring Stretch 3 reps;30 seconds;Other (comment)  RLE   Single Knee to Chest Stretch 3 reps;30 seconds;Other (comment)  RLE   Piriformis Stretch 3 reps;30 seconds;Other (comment)   Lumbar Exercises: Supine   Ab Set 20 reps;5 seconds   Clam 20 reps   Bridge 20 reps  4/10 pain at initial rep but pain  dissipated   Straight Leg Raise 20 reps;Other (comment)  RLE   Modalities   Modalities Traction   Traction   Type of Traction Lumbar   Min (lbs) 5   Max (lbs) 90   Hold Time 99   Rest Time 5   Time 15                  PT Short Term Goals - 11/05/15 1649    PT SHORT TERM GOAL #1   Title Ind with HEP.   Time 2   Period Weeks   Status New           PT Long Term Goals - 11/05/15 1650    PT LONG TERM GOAL #1   Title Perform ADL's with pain not > 3/10.   Time 6   Period Weeks   Status New   PT LONG TERM GOAL #2   Title Eliminate right LE pain.   Time 6   Period Weeks               Plan - 11/13/15 [id number]    Clinical Impression Statement Patient tolerated today's treatment well with only reports of increased pain with initial repititions of bridging which dissipated and patient only reported burning sensation. Completed all supine exercises with core activation cueing for core strengthening. Initated lumbar traction at 90# max per MPT guidelines. Normal traction response noted following end of traction session. Denied pain following today's treatment.   Pt will benefit from skilled therapeutic intervention in order to improve on the following deficits Pain;Decreased activity tolerance   PT Frequency 2x / week   PT Duration 6 weeks   PT Treatment/Interventions Patient/family education;Traction;Therapeutic activities;Therapeutic exercise   PT Next Visit Plan Int traction at 90# (max 110-115#); Core exercises.   Consulted and Agree with Plan of Care Patient        Problem List Patient Active Problem List   Diagnosis Date Noted  . Cellulitis of left axilla   . Abscess of axilla, left 01/07/2015  . Paroxysmal atrial fibrillation ([place]) 05/24/2014  . Abnormality of gait 03/29/2013  . Diabetic neuropathy ([place]) 03/29/2013  . OSA (obstructive sleep apnea) 08/26/2012  . Chronic asthmatic bronchitis ([place]) 08/26/2012  . Preop cardiovascular exam 08/15/2012  .  IBS (irritable bowel syndrome) 05/27/2011  . Mobitz type II atrioventricular block 02/09/2011  . HTN (hypertension) 02/09/2011  . Obesity 02/09/2011    Wynelle Fanny, PTA 11/13/2015, 9:48 AM  [facility] [address] [place], [place], [address] Phone: [phone]   Fax:  [phone]  Name: Brandi Hunter MRN: [id number] Date of Birth: Nov 10, 1941

## 2015-11-19 ENCOUNTER — Ambulatory Visit: Payer: Medicare Other | Attending: Physical Medicine and Rehabilitation | Admitting: *Deleted

## 2015-11-19 ENCOUNTER — Encounter: Payer: Self-pay | Admitting: *Deleted

## 2015-11-19 DIAGNOSIS — M5441 Lumbago with sciatica, right side: Secondary | ICD-10-CM | POA: Insufficient documentation

## 2015-11-19 NOTE — Therapy (Signed)
[facility] [place], [place], [address] Phone: [phone]   Fax:  [phone]  Physical Therapy Treatment  Patient Details  Name: Brandi Hunter MRN: [id number] Date of Birth: June 08, 1941 Referring Provider: Suella Broad MD.  Encounter Date: 11/19/2015      PT End of Session - 11/19/15 1204    Visit Number 3   Number of Visits 12   Date for PT Re-Evaluation 12/17/15   PT Start Time 1115   PT Stop Time 1205   PT Time Calculation (min) 50 min      Past Medical History  Diagnosis Date  . Pacemaker     Implanted  by Dr [name] (MDT) 10/06/10  . Mobitz (type) II atrioventricular block   . HTN (hypertension)   . Obesity   . Hyperlipidemia   . DJD (degenerative joint disease)   . Asthma   . COPD (chronic obstructive pulmonary disease) ([place])   . Pancreatitis 2010 OR 2011    pt thought this had been ruled out  . Sleep apnea     CPA SETTING OF 14  . Diabetes mellitus without complication ([place])     TYPE 2  . GERD (gastroesophageal reflux disease)   . History of home oxygen therapy     oxygen 2 liters per nasal cannula at hs  . PAF (paroxysmal atrial fibrillation) ([place])   . Chronic anticoagulation     Past Surgical History  Procedure Laterality Date  . Insert / replace / remove pacemaker  10/06/10    MDT  implanted by Dr [name]  . Total abdominal hysterectomy  1971  . Cystocele repair    . Ovary surgery      removal  . Repair rectocele    . Knee arthroscopy      both  . Cataract extraction w/phaco  11/02/2011    Procedure: CATARACT EXTRACTION PHACO AND INTRAOCULAR LENS PLACEMENT (IOC);  Surgeon: Williams Che;  [facility];  Service: Ophthalmology;  Laterality: Right;  CDE=7.33  . Cataract extraction w/phaco  12/07/2011    Procedure: CATARACT EXTRACTION PHACO AND INTRAOCULAR LENS PLACEMENT (IOC);  Surgeon: Williams Che, MD;  [facility]: [facility];  Service: Ophthalmology;  Laterality: Left;  CDE 3.61  .  Eye surgery  11/01/2012    BOTH EYES CATARACTS  . Tonsillectomy  AGE 60  . Colonoscopy with propofol N/A 08/09/2014    Procedure: COLONOSCOPY WITH PROPOFOL;  Surgeon: Juanita Craver, MD;  [facility];  Service: Endoscopy;  Laterality: N/A;  . Esophagogastroduodenoscopy (egd) with propofol N/A 08/09/2014    Procedure: ESOPHAGOGASTRODUODENOSCOPY (EGD) WITH PROPOFOL;  Surgeon: Juanita Craver, MD;  [facility];  Service: Endoscopy;  Laterality: N/A;  . Simple mastectomy with axillary sentinel node biopsy Left 01/09/2015    Procedure: Irrigation and Drainage Abcess left Axilla;  Surgeon: Jackolyn Confer, MD;  [facility];  Service: General;  Laterality: Left;    There were no vitals filed for this visit.  Visit Diagnosis:  Right-sided low back pain with right-sided sciatica      Subjective Assessment - 11/19/15 1124    Subjective Reports a burning sensation just superior to R SI joint yesterday. Still cannot stand for long periods of time per patient report.  Traction helps   Patient Stated Goals Get out of pain.   Currently in Pain? Yes   Pain Score 4    Pain Location Back   Pain Orientation Right   Pain Descriptors / Indicators Aching  Pain Type Acute pain   Pain Frequency Constant                         [facility] Adult PT Treatment/Exercise - 11/19/15 0001    Exercises   Exercises Lumbar   Lumbar Exercises: Stretches   Active Hamstring Stretch 3 reps;30 seconds;Other (comment)  RLE   Single Knee to Chest Stretch 3 reps;30 seconds;Other (comment)  RLE   Lumbar Exercises: Supine   Ab Set 20 reps;5 seconds  Draw in   Bent Knee Raise 20 reps;3 seconds   Bridge 20 reps  4/10 pain at initial rep but pain dissipated   Modalities   Modalities Traction   Traction   Type of Traction Lumbar   Min (lbs) 5   Max (lbs) 90   Hold Time 99   Rest Time 5   Time 15                  PT Short Term Goals - 11/05/15 1649    PT SHORT TERM GOAL  #1   Title Ind with HEP.   Time 2   Period Weeks   Status New           PT Long Term Goals - 11/05/15 1650    PT LONG TERM GOAL #1   Title Perform ADL's with pain not > 3/10.   Time 6   Period Weeks   Status New   PT LONG TERM GOAL #2   Title Eliminate right LE pain.   Time 6   Period Weeks               Plan - 11/19/15 1205    Clinical Impression Statement Pt did fairly well with core activation exs and LE stretches. Pelvic Traction performed at 90#s again and was tolerated well. HEP for Drawin core activation was given.   Pt will benefit from skilled therapeutic intervention in order to improve on the following deficits Pain;Decreased activity tolerance   PT Frequency 2x / week   PT Duration 6 weeks   PT Treatment/Interventions Patient/family education;Traction;Therapeutic activities;Therapeutic exercise   PT Next Visit Plan Int traction at 90# (max 110-115#); Core exercises.   Consulted and Agree with Plan of Care Patient        Problem List Patient Active Problem List   Diagnosis Date Noted  . Cellulitis of left axilla   . Abscess of axilla, left 01/07/2015  . Paroxysmal atrial fibrillation ([place]) 05/24/2014  . Abnormality of gait 03/29/2013  . Diabetic neuropathy ([place]) 03/29/2013  . OSA (obstructive sleep apnea) 08/26/2012  . Chronic asthmatic bronchitis ([place]) 08/26/2012  . Preop cardiovascular exam 08/15/2012  . IBS (irritable bowel syndrome) 05/27/2011  . Mobitz type II atrioventricular block 02/09/2011  . HTN (hypertension) 02/09/2011  . Obesity 02/09/2011    Joye Wesenberg,CHRIS, PTA 11/19/2015, 12:09 PM  [facility] [place], [place], [address] Phone: [phone]   Fax:  [phone]  Name: Brandi Hunter MRN: [id number] Date of Birth: 11-15-1941

## 2015-11-19 NOTE — Patient Instructions (Signed)

## 2015-11-21 ENCOUNTER — Encounter: Payer: Self-pay | Admitting: *Deleted

## 2015-11-21 ENCOUNTER — Ambulatory Visit: Payer: Medicare Other | Admitting: *Deleted

## 2015-11-21 DIAGNOSIS — M5441 Lumbago with sciatica, right side: Secondary | ICD-10-CM | POA: Diagnosis not present

## 2015-11-21 NOTE — Therapy (Signed)
[facility] [place], [place], [address] Phone: [phone]   Fax:  [phone]  Physical Therapy Treatment  Patient Details  Name: Brandi Hunter MRN: [id number] Date of Birth: May 26, 1941 Referring Provider: Suella Broad MD.  Encounter Date: 11/21/2015      PT End of Session - 11/21/15 0859    Visit Number 4   Number of Visits 12   Date for PT Re-Evaluation 12/17/15   PT Start Time 0815   PT Stop Time 0905   PT Time Calculation (min) 50 min      Past Medical History  Diagnosis Date  . Pacemaker     Implanted  by Dr [name] (MDT) 10/06/10  . Mobitz (type) II atrioventricular block   . HTN (hypertension)   . Obesity   . Hyperlipidemia   . DJD (degenerative joint disease)   . Asthma   . COPD (chronic obstructive pulmonary disease) ([place])   . Pancreatitis 2010 OR 2011    pt thought this had been ruled out  . Sleep apnea     CPA SETTING OF 14  . Diabetes mellitus without complication ([place])     TYPE 2  . GERD (gastroesophageal reflux disease)   . History of home oxygen therapy     oxygen 2 liters per nasal cannula at hs  . PAF (paroxysmal atrial fibrillation) ([place])   . Chronic anticoagulation     Past Surgical History  Procedure Laterality Date  . Insert / replace / remove pacemaker  10/06/10    MDT  implanted by Dr [name]  . Total abdominal hysterectomy  1971  . Cystocele repair    . Ovary surgery      removal  . Repair rectocele    . Knee arthroscopy      both  . Cataract extraction w/phaco  11/02/2011    Procedure: CATARACT EXTRACTION PHACO AND INTRAOCULAR LENS PLACEMENT (IOC);  Surgeon: Williams Che;  [facility]: [facility];  Service: Ophthalmology;  Laterality: Right;  CDE=7.33  . Cataract extraction w/phaco  12/07/2011    Procedure: CATARACT EXTRACTION PHACO AND INTRAOCULAR LENS PLACEMENT (IOC);  Surgeon: Williams Che, MD;  Location: [facility];  Service: Ophthalmology;  Laterality: Left;  CDE 3.61  .  Eye surgery  11/01/2012    BOTH EYES CATARACTS  . Tonsillectomy  AGE 10  . Colonoscopy with propofol N/A 08/09/2014    Procedure: COLONOSCOPY WITH PROPOFOL;  Surgeon: Juanita Craver, MD;  [facility];  Service: Endoscopy;  Laterality: N/A;  . Esophagogastroduodenoscopy (egd) with propofol N/A 08/09/2014    Procedure: ESOPHAGOGASTRODUODENOSCOPY (EGD) WITH PROPOFOL;  Surgeon: Juanita Craver, MD;  [facility];  Service: Endoscopy;  Laterality: N/A;  . Simple mastectomy with axillary sentinel node biopsy Left 01/09/2015    Procedure: Irrigation and Drainage Abcess left Axilla;  Surgeon: Jackolyn Confer, MD;  [facility];  Service: General;  Laterality: Left;    There were no vitals filed for this visit.  Visit Diagnosis:  Right-sided low back pain with right-sided sciatica      Subjective Assessment - 11/21/15 0813    Subjective Reports a burning sensation just superior to R SI joint yesterday. Still cannot stand for long periods of time per patient report.  Traction helps.  Had a flare up yesterday   Patient Stated Goals Get out of pain.   Currently in Pain? Yes   Pain Score 5    Pain Location Back   Pain Orientation Right  Pain Descriptors / Indicators Aching   Pain Type Acute pain   Aggravating Factors  walking and standing                         [facility] Adult PT Treatment/Exercise - 11/21/15 0001    Modalities   Modalities Traction   Traction   Type of Traction Lumbar   Min (lbs) 5   Max (lbs) 95   Hold Time 99   Rest Time 5   Time 15   Manual Therapy   Manual Therapy Soft tissue mobilization;Myofascial release   Soft tissue mobilization IASTM to RT SIJ and into LB paras with Pt LT sidelying                  PT Short Term Goals - 11/05/15 1649    PT SHORT TERM GOAL #1   Title Ind with HEP.   Time 2   Period Weeks   Status New           PT Long Term Goals - 11/05/15 1650    PT LONG TERM GOAL #1   Title Perform ADL's  with pain not > 3/10.   Time 6   Period Weeks   Status New   PT LONG TERM GOAL #2   Title Eliminate right LE pain.   Time 6   Period Weeks               Plan - 11/21/15 0900    Clinical Impression Statement Pt not doing as well today due to a flare-up at home yesterday. Her LB Paras had notable tightness and soreness today which was less after STW. She was able to tolerate Pelvic traction at 95#s today. No new goals met today due to flare-up.   Pt will benefit from skilled therapeutic intervention in order to improve on the following deficits Pain;Decreased activity tolerance   PT Frequency 2x / week   PT Duration 6 weeks   PT Treatment/Interventions Patient/family education;Traction;Therapeutic activities;Therapeutic exercise;Manual techniques  Manual techniques added. Routed for MPT co-sign   PT Next Visit Plan Int traction at 90# (max 110-115#); Core exercises. Manual as needed   Consulted and Agree with Plan of Care Patient        Problem List Patient Active Problem List   Diagnosis Date Noted  . Cellulitis of left axilla   . Abscess of axilla, left 01/07/2015  . Paroxysmal atrial fibrillation ([place]) 05/24/2014  . Abnormality of gait 03/29/2013  . Diabetic neuropathy ([place]) 03/29/2013  . OSA (obstructive sleep apnea) 08/26/2012  . Chronic asthmatic bronchitis ([place]) 08/26/2012  . Preop cardiovascular exam 08/15/2012  . IBS (irritable bowel syndrome) 05/27/2011  . Mobitz type II atrioventricular block 02/09/2011  . HTN (hypertension) 02/09/2011  . Obesity 02/09/2011    Ronique Simerly,CHRIS, PTA 11/21/2015, 9:15 AM  [facility] [place], [place], [address] Phone: [phone]   Fax:  [phone]  Name: Brandi Hunter MRN: [id number] Date of Birth: [birth year]

## 2015-11-26 ENCOUNTER — Encounter: Payer: Medicare Other | Admitting: *Deleted

## 2015-11-28 ENCOUNTER — Ambulatory Visit: Payer: Medicare Other | Admitting: *Deleted

## 2015-11-28 DIAGNOSIS — M5441 Lumbago with sciatica, right side: Secondary | ICD-10-CM | POA: Diagnosis not present

## 2015-11-28 NOTE — Therapy (Addendum)
[facility] [place], [place], [address] Phone: [phone]   Fax:  [phone]  Physical Therapy Treatment  Patient Details  Name: Brandi Hunter MRN: [id number] Date of Birth: 1941-01-06 Referring Provider: Suella Broad MD.  Encounter Date: 11/28/2015      PT End of Session - 11/28/15 0925    Visit Number 5   Number of Visits 12   Date for PT Re-Evaluation 12/17/15   PT Start Time 0817   PT Stop Time 0845  Unable to complete Rx due to high pain level   PT Time Calculation (min) 28 min      Past Medical History  Diagnosis Date  . Pacemaker     Implanted  by Dr [name] (MDT) 10/06/10  . Mobitz (type) II atrioventricular block   . HTN (hypertension)   . Obesity   . Hyperlipidemia   . DJD (degenerative joint disease)   . Asthma   . COPD (chronic obstructive pulmonary disease) ([place])   . Pancreatitis 2010 OR 2011    pt thought this had been ruled out  . Sleep apnea     CPA SETTING OF 14  . Diabetes mellitus without complication ([place])     TYPE 2  . GERD (gastroesophageal reflux disease)   . History of home oxygen therapy     oxygen 2 liters per nasal cannula at hs  . PAF (paroxysmal atrial fibrillation) ([place])   . Chronic anticoagulation     Past Surgical History  Procedure Laterality Date  . Insert / replace / remove pacemaker  10/06/10    MDT  implanted by Dr [name]  . Total abdominal hysterectomy  1971  . Cystocele repair    . Ovary surgery      removal  . Repair rectocele    . Knee arthroscopy      both  . Cataract extraction w/phaco  11/02/2011    Procedure: CATARACT EXTRACTION PHACO AND INTRAOCULAR LENS PLACEMENT (IOC);  Surgeon: Williams Che;  [facility]: [facility];  Service: Ophthalmology;  Laterality: Right;  CDE=7.33  . Cataract extraction w/phaco  12/07/2011    Procedure: CATARACT EXTRACTION PHACO AND INTRAOCULAR LENS PLACEMENT (IOC);  Surgeon: Williams Che, MD;  Location: [facility];  Service:  Ophthalmology;  Laterality: Left;  CDE 3.61  . Eye surgery  11/01/2012    BOTH EYES CATARACTS  . Tonsillectomy  AGE 5  . Colonoscopy with propofol N/A 08/09/2014    Procedure: COLONOSCOPY WITH PROPOFOL;  Surgeon: Juanita Craver, MD;  [facility];  Service: Endoscopy;  Laterality: N/A;  . Esophagogastroduodenoscopy (egd) with propofol N/A 08/09/2014    Procedure: ESOPHAGOGASTRODUODENOSCOPY (EGD) WITH PROPOFOL;  Surgeon: Juanita Craver, MD;  [facility];  Service: Endoscopy;  Laterality: N/A;  . Simple mastectomy with axillary sentinel node biopsy Left 01/09/2015    Procedure: Irrigation and Drainage Abcess left Axilla;  Surgeon: Jackolyn Confer, MD;  [facility];  Service: General;  Laterality: Left;    There were no vitals filed for this visit.  Visit Diagnosis:  Right-sided low back pain with right-sided sciatica      Subjective Assessment - 11/28/15 0818    Subjective Reports a burning sensation just superior to R SI joint yesterday. Still cannot stand for long periods of time per patient report.  Traction helps.  Had a flare up yesterday Pain is worse today. Went to work and it has been really bad 10/10 with movements. Have to stay in sitting position to  be comfortable   Patient Stated Goals Get out of pain.   Currently in Pain? Yes   Pain Score 5    Pain Location Back   Pain Orientation Right   Pain Descriptors / Indicators Aching   Pain Type Acute pain   Pain Frequency Constant   Aggravating Factors  moving and trying to stand up                         [facility] Adult PT Treatment/Exercise - 11/28/15 0001    Manual Therapy   Manual Therapy Soft tissue mobilization;Myofascial release   Soft tissue mobilization IASTM to RT SIJ and into LB paras with Pt LT sidelying                  PT Short Term Goals - 11/05/15 1649    PT SHORT TERM GOAL #1   Title Ind with HEP.   Time 2   Period Weeks   Status New           PT Long Term  Goals - 11/05/15 1650    PT LONG TERM GOAL #1   Title Perform ADL's with pain not > 3/10.   Time 6   Period Weeks   Status New   PT LONG TERM GOAL #2   Title Eliminate right LE pain.   Time 6   Period Weeks               Plan - 11/28/15 3016    Clinical Impression Statement Pt did not do well today due to high pain levels 10/10.She was unable to stand erect due to pain. Pain would radiate into RT hip and knee. STW was performed and there were  notable tightness and spasms along RT side LB paras and QL. We attempted pelvic traction, but Pt was unable to get into the position due to pain.  MD office was called and Pt will F/U with Dr Nelva Bush    Pt will benefit from skilled therapeutic intervention in order to improve on the following deficits Pain;Decreased activity tolerance   PT Frequency 2x / week   PT Treatment/Interventions Patient/family education;Traction;Therapeutic activities;Therapeutic exercise;Manual techniques   PT Next Visit Plan Int traction at 90# (max 110-115#); Core exercises. Manual as needed  To MD Tomorrow due to high pain levels   Consulted and Agree with Plan of Care Patient        Problem List Patient Active Problem List   Diagnosis Date Noted  . Cellulitis of left axilla   . Abscess of axilla, left 01/07/2015  . Paroxysmal atrial fibrillation ([place]) 05/24/2014  . Abnormality of gait 03/29/2013  . Diabetic neuropathy ([place]) 03/29/2013  . OSA (obstructive sleep apnea) 08/26/2012  . Chronic asthmatic bronchitis ([place]) 08/26/2012  . Preop cardiovascular exam 08/15/2012  . IBS (irritable bowel syndrome) 05/27/2011  . Mobitz type II atrioventricular block 02/09/2011  . HTN (hypertension) 02/09/2011  . Obesity 02/09/2011  PHYSICAL THERAPY DISCHARGE SUMMARY  Visits from Start of Care: 5  Current functional level related to goals / functional outcomes: Please see above.   Remaining deficits: Continued low back and right LE pain.   Education /  Equipment: HEP.  Plan: Patient agrees to discharge.  Patient goals were not met. Patient is being discharged due to meeting the stated rehab goals.  ?????       Mali Applegate MPT [facility] [place], [place], [address] Phone: [phone]  Fax:  [phone]  Name: Brandi Hunter MRN: [id number] Date of Birth: 06-Dec-1940

## 2015-12-03 ENCOUNTER — Encounter: Payer: Medicare Other | Admitting: *Deleted

## 2015-12-05 ENCOUNTER — Encounter: Payer: Medicare Other | Admitting: *Deleted

## 2016-01-02 ENCOUNTER — Ambulatory Visit (INDEPENDENT_AMBULATORY_CARE_PROVIDER_SITE_OTHER): Payer: Medicare Other | Admitting: Pulmonary Disease

## 2016-01-02 ENCOUNTER — Encounter: Payer: Self-pay | Admitting: Pulmonary Disease

## 2016-01-02 VITALS — BP 128/82 | HR 90 | Ht 66.0 in | Wt 241.0 lb

## 2016-01-02 DIAGNOSIS — J9610 Chronic respiratory failure, unspecified whether with hypoxia or hypercapnia: Secondary | ICD-10-CM

## 2016-01-02 DIAGNOSIS — J449 Chronic obstructive pulmonary disease, unspecified: Secondary | ICD-10-CM

## 2016-01-02 DIAGNOSIS — Z720 Tobacco use: Secondary | ICD-10-CM

## 2016-01-02 DIAGNOSIS — E662 Morbid (severe) obesity with alveolar hypoventilation: Secondary | ICD-10-CM

## 2016-01-02 DIAGNOSIS — G4733 Obstructive sleep apnea (adult) (pediatric): Secondary | ICD-10-CM | POA: Diagnosis not present

## 2016-01-02 DIAGNOSIS — J45909 Unspecified asthma, uncomplicated: Secondary | ICD-10-CM

## 2016-01-02 DIAGNOSIS — Z9989 Dependence on other enabling machines and devices: Principal | ICD-10-CM

## 2016-01-02 DIAGNOSIS — J4489 Other specified chronic obstructive pulmonary disease: Secondary | ICD-10-CM

## 2016-01-02 DIAGNOSIS — F17210 Nicotine dependence, cigarettes, uncomplicated: Secondary | ICD-10-CM | POA: Diagnosis not present

## 2016-01-02 NOTE — Progress Notes (Signed)
Current Outpatient Prescriptions on File Prior to Visit  Medication Sig  . albuterol (PROAIR HFA) 108 (90 BASE) MCG/ACT inhaler Inhale 2 puffs into the lungs every 4 (four) hours as needed for wheezing. Inhale 2 puffs into the lungs every 4 (four) hours as needed.  Marland Kitchen amLODipine (NORVASC) 10 MG tablet TAKE ONE TABLET BY MOUTH ONCE DAILY  . atorvastatin (LIPITOR) 40 MG tablet Take 40 mg by mouth daily.   . budesonide-formoterol (SYMBICORT) 160-4.5 MCG/ACT inhaler Inhale 2 puffs into the lungs 2 (two) times daily.  . Calcium Carbonate-Vitamin D (RA CALCIUM PLUS VITAMIN D) 600-400 MG-UNIT per tablet Take 2 tablets by mouth daily.    . citalopram (CELEXA) 40 MG tablet Take 40 mg by mouth at bedtime.   Marland Kitchen ELIQUIS 5 MG TABS tablet TAKE ONE TABLET BY MOUTH TWICE DAILY  . fenofibrate 160 MG tablet Take 160 mg by mouth every evening.  . furosemide (LASIX) 20 MG tablet Take 20 mg by mouth every Monday, Wednesday, and Friday.   Marland Kitchen glipiZIDE (GLUCOTROL XL) 10 MG 24 hr tablet Take 10 mg by mouth 2 (two) times daily.   Marland Kitchen ipratropium-albuterol (DUONEB) 0.5-2.5 (3) MG/3ML SOLN Take 3 mLs by nebulization 2 (two) times daily.  Marland Kitchen LORazepam (ATIVAN) 0.5 MG tablet Take 1 tablet by mouth every 6 (six) hours as needed for anxiety.   Marland Kitchen losartan (COZAAR) 100 MG tablet Take 100 mg by mouth every evening.  . metFORMIN (GLUCOPHAGE) 1000 MG tablet Take 1,000 mg by mouth 2 (two) times daily with a meal.  . metoprolol succinate (TOPROL-XL) 100 MG 24 hr tablet TAKE ONE TABLET BY MOUTH ONCE DAILY  . oxybutynin (DITROPAN-XL) 10 MG 24 hr tablet Take 10 mg by mouth daily.   . pantoprazole (PROTONIX) 40 MG tablet Take 40 mg by mouth daily.   . Probiotic Product (ALIGN PO) Take 1 capsule by mouth daily.   Marland Kitchen spironolactone (ALDACTONE) 25 MG tablet Take 12.5 mg by mouth daily.   No current facility-administered medications on file prior to visit.     Chief Complaint  Patient presents with  . Follow-up    Wears CPAP nightly.  Denies problems with mask. Feels that the pressure needs to be increased.      Tests PSG 11/18/08 >> AHI 9 PFT 09/27/12 >> FEV1 1.81 (79%), FEV1% 65, TLC 4.64 (86%), DLCO 77%, no BD Echo 05/01/14 >> EF 60 to 123456, grade 1 diastolic dysfx CT chest 99991111 >> 5 mm nodule Rt lower lung stable since 2008 Auto CPAP 10/03/15 to 12/31/15 >> used on 71 of 90 nights with average 7 hrs 48 min.  Average AHI 0.8 with median CPAP 6 and 95 th percentile CPAP 11 cm H2O  Past medical hx Mobitz II s/p PM, HTN, HLD, PAF, DJD, Pancreatitis, DM, GERD  Past surgical hx, Allergies, Family hx, Social hx all reviewed.  Vital Signs BP 128/82 mmHg  Pulse 90  Ht 5\' 6"  (1.676 m)  Wt 241 lb (109.317 kg)  BMI 38.92 kg/m2  SpO2 98%  History of Present Illness Brandi Hunter is a 75 y.o. female with COPD/asthma, OSA, and OHS.  She still smokes >> mostly when she is stressed.  She can go days otherwise w/o smoking.  She doesn't need to use her inhalers all the time.  She uses symbicort about 3 times per week, and albuterol sporadically >> mostly with weather change or dust exposure.  She uses CPAP and oxygen at night.  Physical Exam  General -  No distress ENT - No sinus tenderness, no oral exudate, no LAN Cardiac - s1s2 regular, no murmur Chest - No wheeze/rales/dullness Back - No focal tenderness Abd - Soft, non-tender Ext - No edema Neuro - Normal strength Skin - No rashes Psych - normal mood, and behavior   Assessment/Plan  Obstructive sleep apnea. She is compliant with CPAP and reports benefit. Plan: - continue auto CPAP  Chronic respiratory failure with obesity hypoventilation syndrome. Plan: - continue 2 liters oxygen at night with CPAP - discussed importance of weight loss  COPD with asthma. Plan: - continue prn symbicort, duoneb, and proair  Tobacco abuse. Plan: - discussed importance of smoking cessation, and reviewed options to help quit - she will continue to decrease cigarette  use on her own   Patient Instructions  Follow up in 1 year     Chesley Mires, MD [place] Pager:  [phone]

## 2016-01-02 NOTE — Patient Instructions (Signed)
Follow up in 1 year.

## 2016-01-22 ENCOUNTER — Encounter: Payer: Medicare Other | Admitting: *Deleted

## 2016-01-24 ENCOUNTER — Encounter: Payer: Self-pay | Admitting: Cardiology

## 2016-01-28 ENCOUNTER — Ambulatory Visit (INDEPENDENT_AMBULATORY_CARE_PROVIDER_SITE_OTHER): Payer: Medicare Other | Admitting: *Deleted

## 2016-01-28 ENCOUNTER — Telehealth: Payer: Self-pay | Admitting: Cardiology

## 2016-01-28 DIAGNOSIS — I441 Atrioventricular block, second degree: Secondary | ICD-10-CM | POA: Diagnosis not present

## 2016-01-28 DIAGNOSIS — Z950 Presence of cardiac pacemaker: Secondary | ICD-10-CM | POA: Diagnosis not present

## 2016-01-28 NOTE — Telephone Encounter (Signed)
LMOVM reminding pt to send remote transmission.   

## 2016-01-29 NOTE — Progress Notes (Signed)
Remote pacemaker transmission.   

## 2016-02-07 LAB — CUP PACEART REMOTE DEVICE CHECK
Battery Impedance: 325 Ohm
Brady Statistic AP VS Percent: 0 %
Brady Statistic AS VS Percent: 33 %
Implantable Lead Implant Date: 20111121
Implantable Lead Location: 753860
Implantable Lead Model: 4470
Implantable Lead Serial Number: 548226
Implantable Lead Serial Number: 687643
Lead Channel Impedance Value: 517 Ohm
Lead Channel Pacing Threshold Pulse Width: 0.4 ms
Lead Channel Sensing Intrinsic Amplitude: 11.2 mV
Lead Channel Setting Pacing Amplitude: 2.5 V
Lead Channel Setting Sensing Sensitivity: 4 mV
MDC IDC LEAD IMPLANT DT: 20111121
MDC IDC LEAD LOCATION: 753859
MDC IDC MSMT BATTERY REMAINING LONGEVITY: 98 mo
MDC IDC MSMT BATTERY VOLTAGE: 2.78 V
MDC IDC MSMT LEADCHNL RA IMPEDANCE VALUE: 461 Ohm
MDC IDC MSMT LEADCHNL RA SENSING INTR AMPL: 2.8 mV
MDC IDC MSMT LEADCHNL RV PACING THRESHOLD AMPLITUDE: 0.625 V
MDC IDC SESS DTM: 20170315001222
MDC IDC SET LEADCHNL RA PACING AMPLITUDE: 2 V
MDC IDC SET LEADCHNL RV PACING PULSEWIDTH: 0.4 ms
MDC IDC STAT BRADY AP VP PERCENT: 55 %
MDC IDC STAT BRADY AS VP PERCENT: 12 %

## 2016-02-10 ENCOUNTER — Encounter: Payer: Self-pay | Admitting: Cardiology

## 2016-02-27 ENCOUNTER — Other Ambulatory Visit: Payer: Self-pay | Admitting: Internal Medicine

## 2016-04-28 ENCOUNTER — Ambulatory Visit (INDEPENDENT_AMBULATORY_CARE_PROVIDER_SITE_OTHER): Payer: Medicare Other | Admitting: *Deleted

## 2016-04-28 ENCOUNTER — Telehealth: Payer: Self-pay | Admitting: Cardiology

## 2016-04-28 DIAGNOSIS — I441 Atrioventricular block, second degree: Secondary | ICD-10-CM | POA: Diagnosis not present

## 2016-04-28 NOTE — Telephone Encounter (Signed)
LMOVM reminding pt to send remote transmission.   

## 2016-04-29 ENCOUNTER — Telehealth: Payer: Self-pay

## 2016-04-29 NOTE — Progress Notes (Signed)
Remote pacemaker transmission.   

## 2016-04-29 NOTE — Telephone Encounter (Signed)
Patient was self referred to [facility] clinic at [facility].  Call to patient and advised at this time she does not have a diagnosis of heart failure at this time.  Patient has a pacemaker, no defibrillator.  Advised would not be able to check her fluid levels on a monthly basis.  She stated she understood.

## 2016-05-05 ENCOUNTER — Ambulatory Visit (INDEPENDENT_AMBULATORY_CARE_PROVIDER_SITE_OTHER): Payer: Medicare Other | Admitting: Nurse Practitioner

## 2016-05-05 ENCOUNTER — Encounter: Payer: Self-pay | Admitting: Nurse Practitioner

## 2016-05-05 VITALS — BP 128/72 | HR 88 | Wt 240.8 lb

## 2016-05-05 DIAGNOSIS — I480 Paroxysmal atrial fibrillation: Secondary | ICD-10-CM | POA: Diagnosis not present

## 2016-05-05 DIAGNOSIS — Z950 Presence of cardiac pacemaker: Secondary | ICD-10-CM

## 2016-05-05 DIAGNOSIS — Z720 Tobacco use: Secondary | ICD-10-CM

## 2016-05-05 DIAGNOSIS — I441 Atrioventricular block, second degree: Secondary | ICD-10-CM

## 2016-05-05 DIAGNOSIS — E669 Obesity, unspecified: Secondary | ICD-10-CM | POA: Diagnosis not present

## 2016-05-05 LAB — CUP PACEART REMOTE DEVICE CHECK
Battery Impedance: 349 Ohm
Battery Remaining Longevity: 92 mo
Battery Voltage: 2.78 V
Brady Statistic AP VP Percent: 72 %
Brady Statistic AP VS Percent: 0 %
Brady Statistic AS VP Percent: 9 %
Brady Statistic AS VS Percent: 19 %
Date Time Interrogation Session: 20170613234245
Implantable Lead Implant Date: 20111121
Implantable Lead Implant Date: 20111121
Implantable Lead Location: 753859
Implantable Lead Location: 753860
Implantable Lead Model: 4469
Implantable Lead Model: 4470
Implantable Lead Serial Number: 548226
Implantable Lead Serial Number: 687643
Lead Channel Impedance Value: 481 Ohm
Lead Channel Impedance Value: 527 Ohm
Lead Channel Pacing Threshold Amplitude: 0.625 V
Lead Channel Pacing Threshold Pulse Width: 0.4 ms
Lead Channel Sensing Intrinsic Amplitude: 2.8 mV
Lead Channel Setting Pacing Amplitude: 2 V
Lead Channel Setting Pacing Amplitude: 2.5 V
Lead Channel Setting Pacing Pulse Width: 0.4 ms
Lead Channel Setting Sensing Sensitivity: 4 mV

## 2016-05-05 NOTE — Progress Notes (Signed)
CARDIOLOGY OFFICE NOTE  Date:  05/05/2016    Brandi Hunter Date of Birth: [birth year] Medical Record [id number]  PCP:  Brandi Aly, FNP  Cardiologist:  [name]    Chief Complaint  Patient presents with  . Atrial Fibrillation  . Hypertension  . Irregular Heart Beat    6 month check - seen for Dr. [name]    History of Present Illness: Brandi Hunter is a 75 y.o. female who presents today for a 6 month check. Seen for Dr. [name]. She has had AV block with PPM dating back to 2011, HTN, diabetes, obesity, HLD, DJD, asthma, COPD with ongoing tobacco abuse, OSA and depression. Normal Myoview in October of 2013. Normal EF per echo in June of 2015.  She has had PAF  - now on anticoagulation with Eliquis.   Last seen by me back in July of 2016 - has chronic DOE and fatigue. Seen by Dr. [name] in December - felt to be stable. She has continued to smoke.   Has seen PCP back in May and June - endorsed more swelling and had diuretics added. She checked a BNP - this was 196.  Wanted echo considered.   Comes in today. Here alone today. She has lost weight. She says she was not trying but lived off of peanut butter crackers and drinking water for about a month while having back issues. Some swelling in her legs - she is on more diuretics. Probably gets too much salt. Breathing is about the same. Smoking less. BP in the 140's at home. No chest pain. She is happy with how she is doing.   Past Medical History  Diagnosis Date  . Pacemaker     Implanted  by Dr [name] (MDT) 10/06/10  . Mobitz (type) II atrioventricular block   . HTN (hypertension)   . Obesity   . Hyperlipidemia   . DJD (degenerative joint disease)   . Asthma   . COPD (chronic obstructive pulmonary disease) ([place])   . Pancreatitis 2010 OR 2011    pt thought this had been ruled out  . Sleep apnea     CPA SETTING OF 14  . Diabetes mellitus without complication ([place])     TYPE 2  . GERD (gastroesophageal reflux  disease)   . History of home oxygen therapy     oxygen 2 liters per nasal cannula at hs  . PAF (paroxysmal atrial fibrillation) ([place])   . Chronic anticoagulation     Past Surgical History  Procedure Laterality Date  . Insert / replace / remove pacemaker  10/06/10    MDT  implanted by Dr [name]  . Total abdominal hysterectomy  1971  . Cystocele repair    . Ovary surgery      removal  . Repair rectocele    . Knee arthroscopy      both  . Cataract extraction w/phaco  11/02/2011    Procedure: CATARACT EXTRACTION PHACO AND INTRAOCULAR LENS PLACEMENT (IOC);  Surgeon: Williams Che;  Location: [facility];  Service: Ophthalmology;  Laterality: Right;  CDE=7.33  . Cataract extraction w/phaco  12/07/2011    Procedure: CATARACT EXTRACTION PHACO AND INTRAOCULAR LENS PLACEMENT (IOC);  Surgeon: Williams Che, MD;  Location: [facility];  Service: Ophthalmology;  Laterality: Left;  CDE 3.61  . Eye surgery  11/01/2012    BOTH EYES CATARACTS  . Tonsillectomy  AGE 47  . Colonoscopy with propofol N/A 08/09/2014    Procedure: COLONOSCOPY WITH  PROPOFOL;  Surgeon: Juanita Craver, MD;  [facility]: [facility];  Service: Endoscopy;  Laterality: N/A;  . Esophagogastroduodenoscopy (egd) with propofol N/A 08/09/2014    Procedure: ESOPHAGOGASTRODUODENOSCOPY (EGD) WITH PROPOFOL;  Surgeon: Juanita Craver, MD;  [facility];  Service: Endoscopy;  Laterality: N/A;  . Simple mastectomy with axillary sentinel node biopsy Left 01/09/2015    Procedure: Irrigation and Drainage Abcess left Axilla;  Surgeon: Jackolyn Confer, MD;  Location: [facility];  Service: General;  Laterality: Left;     Medications: Current Outpatient Prescriptions  Medication Sig Dispense Refill  . albuterol (PROAIR HFA) 108 (90 BASE) MCG/ACT inhaler Inhale 2 puffs into the lungs every 4 (four) hours as needed for wheezing. Inhale 2 puffs into the lungs every 4 (four) hours as needed.    Marland Kitchen amLODipine (NORVASC) 10 MG tablet TAKE ONE TABLET BY MOUTH  ONCE DAILY 90 tablet 3  . atorvastatin (LIPITOR) 40 MG tablet Take 40 mg by mouth daily.     . budesonide-formoterol (SYMBICORT) 160-4.5 MCG/ACT inhaler Inhale 2 puffs into the lungs 2 (two) times daily.    . citalopram (CELEXA) 40 MG tablet Take 40 mg by mouth at bedtime.     Marland Kitchen ELIQUIS 5 MG TABS tablet TAKE ONE TABLET BY MOUTH TWICE DAILY 60 tablet 6  . fenofibrate 160 MG tablet Take 160 mg by mouth every evening.    . furosemide (LASIX) 20 MG tablet Take 40 mg by mouth daily.     Marland Kitchen glipiZIDE (GLUCOTROL XL) 10 MG 24 hr tablet Take 10 mg by mouth 2 (two) times daily.     Marland Kitchen ipratropium-albuterol (DUONEB) 0.5-2.5 (3) MG/3ML SOLN Take 3 mLs by nebulization 2 (two) times daily.    Marland Kitchen LORazepam (ATIVAN) 0.5 MG tablet Take 1 tablet by mouth every 6 (six) hours as needed for anxiety.     Marland Kitchen losartan (COZAAR) 100 MG tablet Take 100 mg by mouth every evening.    . metFORMIN (GLUCOPHAGE) 1000 MG tablet Take 1,000 mg by mouth 2 (two) times daily with a meal.    . metoprolol succinate (TOPROL-XL) 100 MG 24 hr tablet TAKE ONE TABLET BY MOUTH ONCE DAILY 90 tablet 3  . oxybutynin (DITROPAN-XL) 10 MG 24 hr tablet Take 10 mg by mouth daily.     . pantoprazole (PROTONIX) 40 MG tablet Take 40 mg by mouth daily.     . Probiotic Product (ALIGN PO) Take 1 capsule by mouth daily.     Marland Kitchen spironolactone (ALDACTONE) 25 MG tablet Take 12.5 mg by mouth daily.     No current facility-administered medications for this visit.    Allergies: Allergies  Allergen Reactions  . Bee Venom Swelling  . Latex Swelling    LATEX CATHETERS    Social History: The patient  reports that she has been smoking Cigarettes.  She has a 5 pack-year smoking history. She quit smokeless tobacco use about 4 years ago. She reports that she drinks about 0.6 oz of alcohol per week. She reports that she does not use illicit drugs.   Family History: The patient's family history includes Arthritis in her sister; Cancer in her brother; Diabetes in  her brother; Heart disease in her father and mother. There is no history of Anesthesia problems, Hypotension, Malignant hyperthermia, or Pseudochol deficiency.   Review of Systems: Please see the history of present illness.   Otherwise, the review of systems is positive for none.   All other systems are reviewed and negative.   Physical Exam: VS:  BP 128/72 mmHg  Pulse 88  Wt 240 lb 12.8 oz (109.226 kg)  SpO2 94% .  BMI Body mass index is 38.88 kg/(m^2).  Wt Readings from Last 3 Encounters:  05/05/16 240 lb 12.8 oz (109.226 kg)  01/02/16 241 lb (109.317 kg)  10/23/15 256 lb 9.6 oz (116.393 kg)    General: Pleasant. She is alert and in no acute distress. She has lost 16 pounds since she was here in December.  HEENT: Normal. Neck: Supple, no JVD, carotid bruits, or masses noted.  Cardiac: Regular rate and rhythm. No murmurs, rubs, or gallops. No edema.  Respiratory:  Lungs are clear to auscultation bilaterally with normal work of breathing.  GI: Soft and nontender.  MS: No deformity or atrophy. Gait and ROM intact. Skin: Warm and dry. Color is normal.  Neuro:  Strength and sensation are intact and no gross focal deficits noted.  Psych: Alert, appropriate and with normal affect.   LABORATORY DATA:  EKG:  EKG is not ordered today.  Lab Results  Component Value Date   WBC 7.5 01/07/2015   HGB 12.1 01/07/2015   HCT 37.8 01/07/2015   PLT 355 01/07/2015   GLUCOSE 124* 01/07/2015   ALT 16 01/07/2015   AST 20 01/07/2015   NA 138 01/07/2015   K 4.0 01/07/2015   CL 101 01/07/2015   CREATININE 0.82 01/10/2015   BUN 18 01/07/2015   CO2 27 01/07/2015   INR 1.13 01/08/2015    BNP (last 3 results) No results for input(s): BNP in the last 8760 hours.  ProBNP (last 3 results) No results for input(s): PROBNP in the last 8760 hours.   Other Studies Reviewed Today:  Echo Study Conclusions from 04/2014  - Left ventricle: The cavity size was normal. Systolic function  was normal. The estimated ejection fraction was in the range of 60% to 65%. Wall motion was normal; there were no regional wall motion abnormalities. Doppler parameters are consistent with abnormal left ventricular relaxation (grade 1 diastolic dysfunction). - Aortic valve: Trileaflet; normal thickness leaflets. There was no regurgitation. - Aortic root: The aortic root was normal in size. - Mitral valve: There was mild regurgitation. - Left atrium: The atrium was mildly dilated. - Right ventricle: Systolic function was normal. - Right atrium: The atrium was normal in size. - Pulmonic valve: There was no regurgitation. - Pulmonary arteries: Systolic pressure was within the normal range. - Pericardium, extracardiac: There was no pericardial effusion.  Impressions:  - Normal biventricular size and function. Impaired relaxation. Mild mitral regurgitation  Assessment/Plan: 1. Mobitz II second degree AV block - has PPM in place - followed by Dr. [name] - to see him back in December.  2. HTN - BP better here today. Encouraged her to continue with her efforts at weight loss and salt restriction. She will continue to monitor.    3. Obesity -  Actively losing weight now. Encouraged her to continue her efforts.   4. Tobacco - Encouraged her to quit.   5. PAF - on anticoagulation - has had labs by PCP.   6. Swelling - most likely from diastolic dysfunction. I doubt her echo has changed. She is losing weight. Needs continued weight loss and and salt restriction. BNP not really elevated. Would hold on echo for now.  Current medicines are reviewed with the patient today.  The patient does not have concerns regarding medicines other than what has been noted above.  The following changes have been made:  See above.  Labs/ tests ordered today include:   No orders of the defined types were placed in this encounter.     Disposition:   FU with Dr. [name] in 6 months.    Patient is agreeable to this plan and will call if any problems develop in the interim.   Signed: [name], RN, ANP-C 05/05/2016 9:06 AM  [facility] [address] [place], [place]  [address] Phone: [phone] Fax: [phone]

## 2016-05-05 NOTE — Patient Instructions (Addendum)
We will be checking the following labs today - NONE   Medication Instructions:    Continue with your current medicines.     Testing/Procedures To Be Arranged:  N/A  Follow-Up:   See Dr. [name] in 6 months.    Other Special Instructions:   Keep up the good work on losing weight.    If you need a refill on your cardiac medications before your next appointment, please call your pharmacy.   Call the [place] office at [phone] if you have any questions, problems or concerns.

## 2016-05-06 ENCOUNTER — Encounter: Payer: Self-pay | Admitting: Cardiology

## 2016-05-12 ENCOUNTER — Ambulatory Visit: Payer: Medicare Other | Admitting: Nurse Practitioner

## 2016-05-14 ENCOUNTER — Other Ambulatory Visit: Payer: Self-pay | Admitting: Nurse Practitioner

## 2016-05-14 DIAGNOSIS — Z1231 Encounter for screening mammogram for malignant neoplasm of breast: Secondary | ICD-10-CM

## 2016-05-20 ENCOUNTER — Ambulatory Visit
Admission: RE | Admit: 2016-05-20 | Discharge: 2016-05-20 | Disposition: A | Discharge status: Home / Self Care | Payer: Medicare Other | Source: Ambulatory Visit | Attending: Nurse Practitioner | Admitting: Nurse Practitioner

## 2016-05-20 DIAGNOSIS — Z1231 Encounter for screening mammogram for malignant neoplasm of breast: Secondary | ICD-10-CM

## 2016-07-01 ENCOUNTER — Telehealth: Payer: Self-pay | Admitting: Pharmacist

## 2016-07-01 NOTE — Telephone Encounter (Signed)
Received fax from [facility] that pt is scheduled for a back injection on 07/21/16. Request to hold Eliquis x3 days prior. Pt takes Eliquis for afib, CHADS2 score of 2 (HTN and DM), no hx of stroke. Ok to hold Eliquis x3 days, clearance faxed to [phone].

## 2016-07-22 DIAGNOSIS — L02419 Cutaneous abscess of limb, unspecified: Secondary | ICD-10-CM | POA: Insufficient documentation

## 2016-07-29 ENCOUNTER — Telehealth: Payer: Self-pay | Admitting: Cardiology

## 2016-07-29 ENCOUNTER — Ambulatory Visit (INDEPENDENT_AMBULATORY_CARE_PROVIDER_SITE_OTHER): Payer: Medicare Other | Admitting: *Deleted

## 2016-07-29 DIAGNOSIS — I441 Atrioventricular block, second degree: Secondary | ICD-10-CM

## 2016-07-29 DIAGNOSIS — Z950 Presence of cardiac pacemaker: Secondary | ICD-10-CM

## 2016-07-29 NOTE — Telephone Encounter (Signed)
Spoke with pt and reminded pt of remote transmission that is due today. Pt verbalized understanding.   

## 2016-07-30 ENCOUNTER — Encounter: Payer: Self-pay | Admitting: Cardiology

## 2016-07-30 NOTE — Progress Notes (Signed)
Remote pacemaker transmission.   

## 2016-08-06 LAB — CUP PACEART REMOTE DEVICE CHECK
Battery Impedance: 373 Ohm
Brady Statistic AP VS Percent: 0 %
Brady Statistic AS VP Percent: 9 %
Brady Statistic AS VS Percent: 15 %
Implantable Lead Implant Date: 20111121
Implantable Lead Location: 753859
Implantable Lead Location: 753860
Implantable Lead Model: 4469
Implantable Lead Model: 4470
Implantable Lead Serial Number: 548226
Implantable Lead Serial Number: 687643
Lead Channel Impedance Value: 442 Ohm
Lead Channel Impedance Value: 516 Ohm
Lead Channel Pacing Threshold Pulse Width: 0.4 ms
Lead Channel Sensing Intrinsic Amplitude: 2.8 mV
MDC IDC LEAD IMPLANT DT: 20111121
MDC IDC MSMT BATTERY REMAINING LONGEVITY: 89 mo
MDC IDC MSMT BATTERY VOLTAGE: 2.78 V
MDC IDC MSMT LEADCHNL RV PACING THRESHOLD AMPLITUDE: 0.625 V
MDC IDC SESS DTM: 20170913194555
MDC IDC SET LEADCHNL RA PACING AMPLITUDE: 2 V
MDC IDC SET LEADCHNL RV PACING AMPLITUDE: 2.5 V
MDC IDC SET LEADCHNL RV PACING PULSEWIDTH: 0.4 ms
MDC IDC SET LEADCHNL RV SENSING SENSITIVITY: 4 mV
MDC IDC STAT BRADY AP VP PERCENT: 76 %

## 2016-09-17 DIAGNOSIS — F339 Major depressive disorder, recurrent, unspecified: Secondary | ICD-10-CM | POA: Insufficient documentation

## 2016-10-28 ENCOUNTER — Encounter: Payer: Self-pay | Admitting: Internal Medicine

## 2016-11-04 ENCOUNTER — Encounter (INDEPENDENT_AMBULATORY_CARE_PROVIDER_SITE_OTHER): Payer: Self-pay

## 2016-11-04 ENCOUNTER — Ambulatory Visit (INDEPENDENT_AMBULATORY_CARE_PROVIDER_SITE_OTHER): Payer: Medicare Other | Admitting: Internal Medicine

## 2016-11-04 ENCOUNTER — Encounter: Payer: Self-pay | Admitting: Internal Medicine

## 2016-11-04 VITALS — BP 132/84 | HR 82 | Ht 66.0 in | Wt 243.2 lb

## 2016-11-04 DIAGNOSIS — I480 Paroxysmal atrial fibrillation: Secondary | ICD-10-CM | POA: Diagnosis not present

## 2016-11-04 DIAGNOSIS — I441 Atrioventricular block, second degree: Secondary | ICD-10-CM | POA: Diagnosis not present

## 2016-11-04 DIAGNOSIS — Z720 Tobacco use: Secondary | ICD-10-CM

## 2016-11-04 DIAGNOSIS — E6609 Other obesity due to excess calories: Secondary | ICD-10-CM | POA: Diagnosis not present

## 2016-11-04 LAB — CUP PACEART INCLINIC DEVICE CHECK
Battery Remaining Longevity: 87 mo
Battery Voltage: 2.78 V
Brady Statistic AP VP Percent: 76 %
Brady Statistic AS VP Percent: 9 %
Date Time Interrogation Session: 20171220140805
Implantable Lead Implant Date: 20111121
Implantable Lead Location: 753860
Implantable Lead Model: 4469
Implantable Lead Model: 4470
Implantable Lead Serial Number: 687643
Implantable Pulse Generator Implant Date: 20111121
Lead Channel Impedance Value: 521 Ohm
Lead Channel Pacing Threshold Amplitude: 0.5 V
Lead Channel Sensing Intrinsic Amplitude: 15.67 mV
Lead Channel Sensing Intrinsic Amplitude: 2.8 mV
Lead Channel Setting Pacing Amplitude: 2 V
MDC IDC LEAD IMPLANT DT: 20111121
MDC IDC LEAD LOCATION: 753859
MDC IDC LEAD SERIAL: 548226
MDC IDC MSMT BATTERY IMPEDANCE: 398 Ohm
MDC IDC MSMT LEADCHNL RA IMPEDANCE VALUE: 474 Ohm
MDC IDC MSMT LEADCHNL RA PACING THRESHOLD PULSEWIDTH: 0.4 ms
MDC IDC MSMT LEADCHNL RV PACING THRESHOLD AMPLITUDE: 0.625 V
MDC IDC MSMT LEADCHNL RV PACING THRESHOLD AMPLITUDE: 0.75 V
MDC IDC MSMT LEADCHNL RV PACING THRESHOLD PULSEWIDTH: 0.4 ms
MDC IDC MSMT LEADCHNL RV PACING THRESHOLD PULSEWIDTH: 0.4 ms
MDC IDC SET LEADCHNL RV PACING AMPLITUDE: 2.5 V
MDC IDC SET LEADCHNL RV PACING PULSEWIDTH: 0.4 ms
MDC IDC SET LEADCHNL RV SENSING SENSITIVITY: 5.6 mV
MDC IDC STAT BRADY AP VS PERCENT: 0 %
MDC IDC STAT BRADY AS VS PERCENT: 15 %

## 2016-11-04 NOTE — Progress Notes (Signed)
[id number], FNP  The patient presents today for electrophysiology followup.  Since last being seen in our clinic, the patient reports doing very well. She continues to work on smoking cessation and weight reduction.  Today, she denies symptoms of palpitations, chest pain, shortness of breath, orthopnea, PND, dizziness, presyncope, further syncope, or neurologic sequela.  She has had no symptoms of AFib. The patient feels that she is tolerating medications without difficulties and is otherwise without complaint today.   Past Medical History:  Diagnosis Date  . Asthma   . Chronic anticoagulation   . COPD (chronic obstructive pulmonary disease) ([place])   . Diabetes mellitus without complication ([place])    TYPE 2  . DJD (degenerative joint disease)   . GERD (gastroesophageal reflux disease)   . History of home oxygen therapy    oxygen 2 liters per nasal cannula at hs  . HTN (hypertension)   . Hyperlipidemia   . Mobitz (type) II atrioventricular block   . Obesity   . Pacemaker    Implanted  by Dr [name] (MDT) 10/06/10  . PAF (paroxysmal atrial fibrillation) ([place])   . Pancreatitis 2010 OR 2011   pt thought this had been ruled out  . Sleep apnea    CPA SETTING OF 14   Past Surgical History:  Procedure Laterality Date  . CATARACT EXTRACTION W/PHACO  11/02/2011   Procedure: CATARACT EXTRACTION PHACO AND INTRAOCULAR LENS PLACEMENT (IOC);  Surgeon: Williams Che;  Location: [facility];  Service: Ophthalmology;  Laterality: Right;  CDE=7.33  . CATARACT EXTRACTION W/PHACO  12/07/2011   Procedure: CATARACT EXTRACTION PHACO AND INTRAOCULAR LENS PLACEMENT (IOC);  Surgeon: Williams Che, MD;  Location: [facility];  Service: Ophthalmology;  Laterality: Left;  CDE 3.61  . COLONOSCOPY WITH PROPOFOL N/A 08/09/2014   Procedure: COLONOSCOPY WITH PROPOFOL;  Surgeon: Juanita Craver, MD;  [facility];  Service: Endoscopy;  Laterality: N/A;  . CYSTOCELE REPAIR    . ESOPHAGOGASTRODUODENOSCOPY  (EGD) WITH PROPOFOL N/A 08/09/2014   Procedure: ESOPHAGOGASTRODUODENOSCOPY (EGD) WITH PROPOFOL;  Surgeon: Juanita Craver, MD;  [facility];  Service: Endoscopy;  Laterality: N/A;  . EYE SURGERY  11/01/2012   BOTH EYES CATARACTS  . INSERT / REPLACE / REMOVE PACEMAKER  10/06/10   MDT  implanted by Dr [name]  . KNEE ARTHROSCOPY     both  . OVARY SURGERY     removal  . REPAIR RECTOCELE    . SIMPLE MASTECTOMY WITH AXILLARY SENTINEL NODE BIOPSY Left 01/09/2015   Procedure: Irrigation and Drainage Abcess left Axilla;  Surgeon: Jackolyn Confer, MD;  Location: [facility];  Service: General;  Laterality: Left;  . TONSILLECTOMY  AGE 54  . TOTAL ABDOMINAL HYSTERECTOMY  1971    Current Outpatient Prescriptions  Medication Sig Dispense Refill  . albuterol (PROAIR HFA) 108 (90 BASE) MCG/ACT inhaler Inhale 2 puffs into the lungs every 4 (four) hours as needed for wheezing. Inhale 2 puffs into the lungs every 4 (four) hours as needed.    Marland Kitchen amLODipine (NORVASC) 10 MG tablet TAKE ONE TABLET BY MOUTH ONCE DAILY 90 tablet 3  . atorvastatin (LIPITOR) 40 MG tablet Take 40 mg by mouth daily.     . budesonide-formoterol (SYMBICORT) 160-4.5 MCG/ACT inhaler Inhale 2 puffs into the lungs 2 (two) times daily.    . DULoxetine (CYMBALTA) 30 MG capsule Take 2 capsules by mouth at bedtime.    Marland Kitchen ELIQUIS 5 MG TABS tablet TAKE ONE TABLET BY MOUTH TWICE DAILY 60 tablet 6  .  fenofibrate 160 MG tablet Take 160 mg by mouth every evening.    . furosemide (LASIX) 20 MG tablet Take 40 mg by mouth daily.     Marland Kitchen glipiZIDE (GLUCOTROL XL) 10 MG 24 hr tablet Take 10 mg by mouth 2 (two) times daily.     Marland Kitchen ipratropium-albuterol (DUONEB) 0.5-2.5 (3) MG/3ML SOLN Take 3 mLs by nebulization 2 (two) times daily.    Marland Kitchen LORazepam (ATIVAN) 0.5 MG tablet Take 1 tablet by mouth every 6 (six) hours as needed for anxiety.     Marland Kitchen losartan (COZAAR) 100 MG tablet Take 100 mg by mouth every evening.    . metFORMIN (GLUCOPHAGE) 1000 MG tablet Take  1,000 mg by mouth 2 (two) times daily with a meal.    . methocarbamol (ROBAXIN) 500 MG tablet Take 1 tablet by mouth every 6 (six) hours as needed for pain.    . metoprolol succinate (TOPROL-XL) 100 MG 24 hr tablet TAKE ONE TABLET BY MOUTH ONCE DAILY 90 tablet 3  . oxybutynin (DITROPAN-XL) 10 MG 24 hr tablet Take 10 mg by mouth daily.     . pantoprazole (PROTONIX) 40 MG tablet Take 40 mg by mouth daily.     . Probiotic Product (ALIGN PO) Take 1 capsule by mouth daily.     Marland Kitchen spironolactone (ALDACTONE) 25 MG tablet Take 12.5 mg by mouth daily.    . traMADol (ULTRAM) 50 MG tablet Take 1 tablet by mouth every 6 (six) hours as needed for pain.     No current facility-administered medications for this visit.     Allergies  Allergen Reactions  . Bee Venom Swelling  . Latex Swelling    LATEX CATHETERS    Social History   Social History  . Marital status: Widowed    Spouse name: N/A  . Number of children: 3  . Years of education: College   Occupational History  . 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 Smoking status: Current Some Day Smoker    Packs/day: 0.10    Years: 50.00    Types: Cigarettes    Last attempt to quit: 07/17/2014  . Smokeless tobacco: Former Systems developer    Quit date: 11/17/2011  . Alcohol use 0.6 oz/week    1 Glasses of wine per week     Comment: couple glasses of wine occasionally-once a month  . Drug use: No  . Sexual activity: Not on file   Other Topics Concern  . Not on file   Social History Narrative   Patient lives at home alone.   Caffeine Use: 16oz drink daily    Family History  Problem Relation Age of Onset  . Heart disease Mother     CHF  . Heart disease Father     CHF  . Arthritis Sister     osteo  . Cancer Brother     prostate  . Diabetes Brother   . Anesthesia problems Neg Hx   . Hypotension Neg Hx   . Malignant hyperthermia Neg Hx   . Pseudochol deficiency Neg Hx      Physical Exam: Vitals:    11/04/16 1046  BP: 132/84  Pulse: 82  Weight: 243 lb 3.2 oz (110.3 kg)  Height: 5\' 6"  (1.676 m)    GEN- The patient is well appearing, alert and oriented x 3 today.   Head- normocephalic, atraumatic Eyes-  Sclera clear, conjunctiva pink Ears- hearing intact Oropharynx- clear Neck- supple,   Lungs-  Clear to ausculation bilaterally, normal work of breathing Chest- pacemaker pocket is well healed Heart- Regular rate and rhythm, no murmurs, rubs or gallops, PMI not laterally displaced GI- soft, NT, ND, + BS Extremities- no clubbing, cyanosis, or edema  Pacemaker interrogation- reviewed in detail today,  See PACEART report  Assessment and Plan:  1. Mobitz II second degree AV block Normal pacemaker function See Pace Art report No changes today  2. HTN Stable No change required today  3. Obesity Weight loss is advised Body mass index is 39.25 kg/m.  She was 256 lbs a year ago.  I am encouraged with her progress.  4. Tobacco I have strongly encouraged her to quit.  She is trying  5. afib On eliquis Maintaining sinus (AF burden 1.5% by PPM) Labs by PCP per patient  Return to see Truitt Merle in 6 months I will see again in 1 year [facility]  Thompson Grayer MD, [facility] 11/04/2016 11:12 AM

## 2016-11-04 NOTE — Patient Instructions (Addendum)
Medication Instructions:  Your physician recommends that you continue on your current medications as directed. Please refer to the Current Medication list given to you today.   Labwork: None ordered   Testing/Procedures: None ordered   Follow-Up: Your physician wants you to follow-up in: 6 months with Truitt Merle, NP and 12 months with Dr. [name].  You will receive a reminder letter in the mail two months in advance. If you don't receive a letter, please call our office to schedule the follow-up appointment.  Remote monitoring is used to monitor your Pacemaker from home. This monitoring reduces the number of office visits required to check your device to one time per year. It allows [place] to keep an eye on the functioning of your device to ensure it is working properly. You are scheduled for a device check from home on 02/03/2017. You may send your transmission at any time that day. If you have a wireless device, the transmission will be sent automatically. After your physician reviews your transmission, you will receive a postcard with your next transmission date.   Any Other Special Instructions Will Be Listed Below (If Applicable).      Low-Sodium Eating Plan Sodium raises blood pressure and causes water to be held in the body. Getting less sodium from food will help lower your blood pressure, reduce any swelling, and protect your heart, liver, and kidneys. We get sodium by adding salt (sodium chloride) to food. Most of our sodium comes from canned, boxed, and frozen foods. Restaurant foods, fast foods, and pizza are also very high in sodium. Even if you take medicine to lower your blood pressure or to reduce fluid in your body, getting less sodium from your food is important. What is my plan? Most people should limit their sodium intake to 2,300 mg a day. Your health care provider recommends that you limit your sodium intake to 2,000mg  a day. What do I need to know about this eating  plan? For the low-sodium eating plan, you will follow these general guidelines:  Choose foods with a % Daily Value for sodium of less than 5% (as listed on the food label).  Use salt-free seasonings or herbs instead of table salt or sea salt.  Check with your health care provider or pharmacist before using salt substitutes.  Eat fresh foods.  Eat more vegetables and fruits.  Limit canned vegetables. If you do use them, rinse them well to decrease the sodium.  Limit cheese to 1 oz (28 g) per day.  Eat lower-sodium products, often labeled as "lower sodium" or "no salt added."  Avoid foods that contain monosodium glutamate (MSG). MSG is sometimes added to [place] food and some canned foods.  Check food labels (Nutrition Facts labels) on foods to learn how much sodium is in one serving.  Eat more home-cooked food and less restaurant, buffet, and fast food.  When eating at a restaurant, ask that your food be prepared with less salt, or no salt if possible. How do I read food labels for sodium information? The Nutrition Facts label lists the amount of sodium in one serving of the food. If you eat more than one serving, you must multiply the listed amount of sodium by the number of servings. Food labels may also identify foods as:  Sodium free-Less than 5 mg in a serving.  Very low sodium-35 mg or less in a serving.  Low sodium-140 mg or less in a serving.  Light in sodium-50% less sodium in a  serving. For example, if a food that usually has 300 mg of sodium is changed to become light in sodium, it will have 150 mg of sodium.  Reduced sodium-25% less sodium in a serving. For example, if a food that usually has 400 mg of sodium is changed to reduced sodium, it will have 300 mg of sodium. What foods can I eat? Grains  Low-sodium cereals, including oats, puffed wheat and rice, and shredded wheat cereals. Low-sodium crackers. Unsalted rice and pasta. Lower-sodium bread. Vegetables   Frozen or fresh vegetables. Low-sodium or reduced-sodium canned vegetables. Low-sodium or reduced-sodium tomato sauce and paste. Low-sodium or reduced-sodium tomato and vegetable juices. Fruits  Fresh, frozen, and canned fruit. Fruit juice. Meat and Other Protein Products  Low-sodium canned tuna and salmon. Fresh or frozen meat, poultry, seafood, and fish. Lamb. Unsalted nuts. Dried beans, peas, and lentils without added salt. Unsalted canned beans. Homemade soups without salt. Eggs. Dairy  Milk. Soy milk. Ricotta cheese. Low-sodium or reduced-sodium cheeses. Yogurt. Condiments  Fresh and dried herbs and spices. Salt-free seasonings. Onion and garlic powders. Low-sodium varieties of mustard and ketchup. Fresh or refrigerated horseradish. Lemon juice. Fats and Oils  Reduced-sodium salad dressings. Unsalted butter. Other  Unsalted popcorn and pretzels. The items listed above may not be a complete list of recommended foods or beverages. Contact your dietitian for more options.  What foods are not recommended? Grains  Instant hot cereals. Bread stuffing, pancake, and biscuit mixes. Croutons. Seasoned rice or pasta mixes. Noodle soup cups. Boxed or frozen macaroni and cheese. Self-rising flour. Regular salted crackers. Vegetables  Regular canned vegetables. Regular canned tomato sauce and paste. Regular tomato and vegetable juices. Frozen vegetables in sauces. Salted [place] fries. Olives. Angie Fava. Relishes. Sauerkraut. Salsa. Meat and Other Protein Products  Salted, canned, smoked, spiced, or pickled meats, seafood, or fish. Bacon, ham, sausage, hot dogs, corned beef, chipped beef, and packaged luncheon meats. Salt pork. Jerky. Pickled herring. Anchovies, regular canned tuna, and sardines. Salted nuts. Dairy  Processed cheese and cheese spreads. Cheese curds. Blue cheese and cottage cheese. Buttermilk. Condiments  Onion and garlic salt, seasoned salt, table salt, and sea salt. Canned and  packaged gravies. Worcestershire sauce. Tartar sauce. Barbecue sauce. Teriyaki sauce. Soy sauce, including reduced sodium. Steak sauce. Fish sauce. Oyster sauce. Cocktail sauce. Horseradish that you find on the shelf. Regular ketchup and mustard. Meat flavorings and tenderizers. Bouillon cubes. Hot sauce. Tabasco sauce. Marinades. Taco seasonings. Relishes. Fats and Oils  Regular salad dressings. Salted butter. Margarine. Ghee. Bacon fat. Other  Potato and tortilla chips. Corn chips and puffs. Salted popcorn and pretzels. Canned or dried soups. Pizza. Frozen entrees and pot pies. The items listed above may not be a complete list of foods and beverages to avoid. Contact your dietitian for more information.  This information is not intended to replace advice given to you by your health care provider. Make sure you discuss any questions you have with your health care provider. Document Released: 04/24/2002 Document Revised: 04/09/2016 Document Reviewed: 09/06/2013 Elsevier Interactive Patient Education  2017 [employer].

## 2016-11-17 DIAGNOSIS — Z950 Presence of cardiac pacemaker: Secondary | ICD-10-CM | POA: Insufficient documentation

## 2016-11-17 DIAGNOSIS — L309 Dermatitis, unspecified: Secondary | ICD-10-CM | POA: Insufficient documentation

## 2016-11-23 ENCOUNTER — Other Ambulatory Visit: Payer: Self-pay | Admitting: Internal Medicine

## 2016-12-08 ENCOUNTER — Telehealth: Payer: Self-pay | Admitting: Internal Medicine

## 2016-12-08 NOTE — Telephone Encounter (Signed)
New Message   Pt scheduled for surgery, and wants to know how many days prior does she need to stop taking Eliquis. Requesting call back.

## 2016-12-09 ENCOUNTER — Encounter: Payer: Self-pay | Admitting: Internal Medicine

## 2016-12-09 NOTE — Telephone Encounter (Signed)
New Message      Pt is out of Eliquis she is to have medical procedure Monday 12/14/16 , how would you like to handle this ? Do you want her back on the Eliquis and post pone surgery or go ahead with surgery then restart eliquis

## 2016-12-09 NOTE — Telephone Encounter (Signed)
Spoke with the [place], [place] [phone]) Dr Isac Sarna is doing elective upper eyelid surgery on Monday.  She has been out of her Eliquis for 5 days.  Has had bronchitis and has not been out to pick up prescription.  Discussed with Dr [name], he recommends she restart her Eliquis today and hold 48 hours prior to her surgery.  Her last dose would be on Fri 12/11/16.  She is aware and I have left a message for Francee Piccolo with the recommendations.

## 2016-12-09 NOTE — Telephone Encounter (Signed)
This encounter was created in error - please disregard.

## 2016-12-24 ENCOUNTER — Ambulatory Visit (INDEPENDENT_AMBULATORY_CARE_PROVIDER_SITE_OTHER): Payer: Medicare Other | Admitting: Pulmonary Disease

## 2016-12-24 ENCOUNTER — Encounter: Payer: Self-pay | Admitting: Pulmonary Disease

## 2016-12-24 VITALS — BP 122/72 | HR 82 | Ht 66.5 in | Wt 235.6 lb

## 2016-12-24 DIAGNOSIS — J449 Chronic obstructive pulmonary disease, unspecified: Secondary | ICD-10-CM

## 2016-12-24 DIAGNOSIS — E662 Morbid (severe) obesity with alveolar hypoventilation: Secondary | ICD-10-CM

## 2016-12-24 DIAGNOSIS — G4733 Obstructive sleep apnea (adult) (pediatric): Secondary | ICD-10-CM

## 2016-12-24 DIAGNOSIS — Z720 Tobacco use: Secondary | ICD-10-CM

## 2016-12-24 DIAGNOSIS — J9610 Chronic respiratory failure, unspecified whether with hypoxia or hypercapnia: Secondary | ICD-10-CM

## 2016-12-24 DIAGNOSIS — Z9989 Dependence on other enabling machines and devices: Secondary | ICD-10-CM

## 2016-12-24 NOTE — Progress Notes (Signed)
Current Outpatient Prescriptions on File Prior to Visit  Medication Sig  . albuterol (PROAIR HFA) 108 (90 BASE) MCG/ACT inhaler Inhale 2 puffs into the lungs every 4 (four) hours as needed for wheezing. Inhale 2 puffs into the lungs every 4 (four) hours as needed.  Marland Kitchen amLODipine (NORVASC) 10 MG tablet TAKE ONE TABLET BY MOUTH ONCE DAILY  . atorvastatin (LIPITOR) 40 MG tablet Take 40 mg by mouth daily.   . budesonide-formoterol (SYMBICORT) 160-4.5 MCG/ACT inhaler Inhale 2 puffs into the lungs 2 (two) times daily.  . DULoxetine (CYMBALTA) 30 MG capsule Take 2 capsules by mouth at bedtime.  Marland Kitchen ELIQUIS 5 MG TABS tablet TAKE ONE TABLET BY MOUTH TWICE DAILY  . fenofibrate 160 MG tablet Take 160 mg by mouth every evening.  . furosemide (LASIX) 20 MG tablet Take 40 mg by mouth daily.   Marland Kitchen glipiZIDE (GLUCOTROL XL) 10 MG 24 hr tablet Take 10 mg by mouth 2 (two) times daily.   Marland Kitchen ipratropium-albuterol (DUONEB) 0.5-2.5 (3) MG/3ML SOLN Take 3 mLs by nebulization 2 (two) times daily.  Marland Kitchen LORazepam (ATIVAN) 0.5 MG tablet Take 1 tablet by mouth every 6 (six) hours as needed for anxiety.   Marland Kitchen losartan (COZAAR) 100 MG tablet Take 100 mg by mouth every evening.  . metFORMIN (GLUCOPHAGE) 1000 MG tablet Take 1,000 mg by mouth 2 (two) times daily with a meal.  . methocarbamol (ROBAXIN) 500 MG tablet Take 1 tablet by mouth every 6 (six) hours as needed for pain.  . metoprolol succinate (TOPROL-XL) 100 MG 24 hr tablet TAKE ONE TABLET BY MOUTH ONCE DAILY  . oxybutynin (DITROPAN-XL) 10 MG 24 hr tablet Take 10 mg by mouth daily.   . Probiotic Product (ALIGN PO) Take 1 capsule by mouth daily.   Marland Kitchen spironolactone (ALDACTONE) 25 MG tablet Take 12.5 mg by mouth daily.  . traMADol (ULTRAM) 50 MG tablet Take 1 tablet by mouth every 6 (six) hours as needed for pain.   No current facility-administered medications on file prior to visit.      Chief Complaint  Patient presents with  . Follow-up    Wears CPAP nightly. Denies  problems with mask/pressure. DME: [facility]     Sleep tests PSG 11/18/08 >> AHI 9 Auto CPAP 09/25/16 to 12/23/16 >> used on 89 of 90 nights with average 10 hrs 23 min.  Average AHI 1.2 with median CPAP 6 and 95 th percentile CPAP 11 cm H2O  Pulmonary tests PFT 09/27/12 >> FEV1 1.81 (79%), FEV1% 65, TLC 4.64 (86%), DLCO 77%, no BD CT chest 05/23/14 >> 5 mm nodule Rt lower lung stable since 2008  Cardiac tests Echo 05/01/14 >> EF 60 to 123456, grade 1 diastolic dysfx  Past medical history Mobitz II s/p PM, HTN, HLD, PAF, DJD, Pancreatitis, DM, GERD  Past surgical history, Family history, Social history, Allergies reviewed  Vital Signs BP 122/72 (BP Location: Left Arm, Cuff Size: Normal)   Pulse 82   Ht 5' 6.5" (1.689 m)   Wt 235 lb 9.6 oz (106.9 kg)   SpO2 96%   BMI 37.46 kg/m   History of Present Illness Brandi Hunter is a 76 y.o. female with COPD/asthma, OSA, and OHS.  She was treated with Abx and prednisone for a bronchitis in January 2018.  She has improved.  She has occasional cough and chest congestion, especially in cold and damp weather.  She uses her nebulizer then.  She denies chest pain, fever, sinus congestion, sore throat, abdominal pain,  or leg swelling.  She uses symbicort bid.  She doesn't feel like her breathing limits her activity.  She uses CPAP and oxygen nightly.  No issue with mask fit.  Reviewed CPAP download with her.  She continues to smoke cigarettes intermittently.  She does this more to keep herself busy.  She can go days w/o smoking and not have a problem, but then can pick it right back up again.  She has tried medicines and nicotine replacement, but these didn't help.  She is determined to quit on her own.  Physical Exam  General - pleasant Eyes - pupils reactive ENT - no sinus tenderness, no oral exudate, no LAN, MP 2, decreased AP diameter Cardiac - regular, no murmur Chest - decreased BS, no wheeze/rales Back - no tenderness Abd - soft, non  tender Ext - no edema Neuro - normal strength Skin - no rashes Psych - normal mood   Assessment/Plan  Obstructive sleep apnea. - she is compliant with CPAP and reports benefit - continue auto CPAP  Chronic respiratory failure with obesity hypoventilation syndrome. - continue 2 liters oxygen at night  COPD with asthma. - continue symbicort - prn duoneb, proair  Tobacco abuse. - discussed how continued smoking is impacting her respiratory symptoms and overall health - reviewed options to assist with smoking cessation - she will continue to try quitting on her own   Patient Instructions  Follow up in 1 year   Chesley Mires, MD [place] Pager:  [phone] 12/24/2016, 10:02 AM

## 2016-12-24 NOTE — Patient Instructions (Signed)
Follow up in 1 year.

## 2017-02-03 ENCOUNTER — Telehealth: Payer: Self-pay | Admitting: Cardiology

## 2017-02-03 ENCOUNTER — Ambulatory Visit (INDEPENDENT_AMBULATORY_CARE_PROVIDER_SITE_OTHER): Payer: Medicare Other | Admitting: *Deleted

## 2017-02-03 DIAGNOSIS — I441 Atrioventricular block, second degree: Secondary | ICD-10-CM

## 2017-02-03 NOTE — Telephone Encounter (Signed)
Spoke with pt and reminded pt of remote transmission that is due today. Pt verbalized understanding.   

## 2017-02-03 NOTE — Progress Notes (Signed)
Remote pacemaker transmission.   

## 2017-02-04 ENCOUNTER — Encounter: Payer: Self-pay | Admitting: Cardiology

## 2017-02-06 LAB — CUP PACEART REMOTE DEVICE CHECK
Battery Impedance: 498 Ohm
Battery Remaining Longevity: 78 mo
Battery Voltage: 2.77 V
Brady Statistic AS VP Percent: 9 %
Brady Statistic AS VS Percent: 9 %
Date Time Interrogation Session: 20180321165339
Implantable Lead Implant Date: 20111121
Implantable Lead Location: 753859
Implantable Lead Location: 753860
Implantable Lead Model: 4469
Implantable Lead Serial Number: 548226
Implantable Pulse Generator Implant Date: 20111121
Lead Channel Setting Pacing Amplitude: 2 V
Lead Channel Setting Sensing Sensitivity: 4 mV
MDC IDC LEAD IMPLANT DT: 20111121
MDC IDC LEAD SERIAL: 687643
MDC IDC MSMT LEADCHNL RA IMPEDANCE VALUE: 449 Ohm
MDC IDC MSMT LEADCHNL RV IMPEDANCE VALUE: 498 Ohm
MDC IDC MSMT LEADCHNL RV PACING THRESHOLD AMPLITUDE: 0.75 V
MDC IDC MSMT LEADCHNL RV PACING THRESHOLD PULSEWIDTH: 0.4 ms
MDC IDC SET LEADCHNL RV PACING AMPLITUDE: 2.5 V
MDC IDC SET LEADCHNL RV PACING PULSEWIDTH: 0.4 ms
MDC IDC STAT BRADY AP VP PERCENT: 82 %
MDC IDC STAT BRADY AP VS PERCENT: 0 %

## 2017-03-23 ENCOUNTER — Other Ambulatory Visit: Payer: Self-pay | Admitting: Internal Medicine

## 2017-05-03 ENCOUNTER — Encounter: Payer: Self-pay | Admitting: Nurse Practitioner

## 2017-05-03 ENCOUNTER — Ambulatory Visit (INDEPENDENT_AMBULATORY_CARE_PROVIDER_SITE_OTHER): Payer: Medicare Other | Admitting: Nurse Practitioner

## 2017-05-03 VITALS — BP 138/76 | HR 70 | Ht 66.0 in | Wt 243.4 lb

## 2017-05-03 DIAGNOSIS — Z79899 Other long term (current) drug therapy: Secondary | ICD-10-CM | POA: Diagnosis not present

## 2017-05-03 DIAGNOSIS — I480 Paroxysmal atrial fibrillation: Secondary | ICD-10-CM | POA: Diagnosis not present

## 2017-05-03 DIAGNOSIS — I441 Atrioventricular block, second degree: Secondary | ICD-10-CM

## 2017-05-03 DIAGNOSIS — I10 Essential (primary) hypertension: Secondary | ICD-10-CM

## 2017-05-03 DIAGNOSIS — R0789 Other chest pain: Secondary | ICD-10-CM | POA: Diagnosis not present

## 2017-05-03 DIAGNOSIS — Z950 Presence of cardiac pacemaker: Secondary | ICD-10-CM

## 2017-05-03 DIAGNOSIS — Z720 Tobacco use: Secondary | ICD-10-CM

## 2017-05-03 LAB — HEPATIC FUNCTION PANEL
ALT: 13 IU/L (ref 0–32)
AST: 17 IU/L (ref 0–40)
Albumin: 4.1 g/dL (ref 3.5–4.8)
Alkaline Phosphatase: 35 IU/L — ABNORMAL LOW (ref 39–117)
Bilirubin Total: 0.5 mg/dL (ref 0.0–1.2)
Bilirubin, Direct: 0.2 mg/dL (ref 0.00–0.40)
Total Protein: 6.1 g/dL (ref 6.0–8.5)

## 2017-05-03 LAB — CBC
Hematocrit: 36.7 % (ref 34.0–46.6)
Hemoglobin: 12 g/dL (ref 11.1–15.9)
MCH: 28.4 pg (ref 26.6–33.0)
MCHC: 32.7 g/dL (ref 31.5–35.7)
MCV: 87 fL (ref 79–97)
Platelets: 261 10*3/uL (ref 150–379)
RBC: 4.22 x10E6/uL (ref 3.77–5.28)
RDW: 14 % (ref 12.3–15.4)
WBC: 6.3 10*3/uL (ref 3.4–10.8)

## 2017-05-03 LAB — BASIC METABOLIC PANEL
BUN/Creatinine Ratio: 25 (ref 12–28)
BUN: 26 mg/dL (ref 8–27)
CO2: 24 mmol/L (ref 20–29)
Calcium: 9.7 mg/dL (ref 8.7–10.3)
Chloride: 100 mmol/L (ref 96–106)
Creatinine, Ser: 1.04 mg/dL — ABNORMAL HIGH (ref 0.57–1.00)
GFR calc Af Amer: 61 mL/min/{1.73_m2} (ref >59)
GFR calc non Af Amer: 53 mL/min/{1.73_m2} — ABNORMAL LOW (ref >59)
Glucose: 141 mg/dL — ABNORMAL HIGH (ref 65–99)
Potassium: 4.3 mmol/L (ref 3.5–5.2)
Sodium: 140 mmol/L (ref 134–144)

## 2017-05-03 LAB — TSH: TSH: 2.43 u[IU]/mL (ref 0.450–4.500)

## 2017-05-03 MED ORDER — NITROGLYCERIN 0.4 MG SL SUBL: 0.4000 mg | SUBLINGUAL_TABLET | SUBLINGUAL | 3 refills | Status: DC | PRN

## 2017-05-03 NOTE — Progress Notes (Signed)
CARDIOLOGY OFFICE NOTE  Date:  05/03/2017    Brandi Hunter Date of Birth: December 31, 1940 Medical Record #[id number]  PCP:  [name], [place]  Cardiologist:  [name]    Chief Complaint  Patient presents with  . Atrial Fibrillation    Follow up visit - seen for Dr. [name]    History of Present Illness: BERKLEE Hunter is a 76 y.o. female who presents today for a 6 month check. Seen for Dr. [name].   She has had AV block with PPM dating back to 2011, HTN, diabetes, obesity, HLD, DJD, asthma, COPD with ongoing tobacco abuse, OSA and depression. Normal Myoview in October of 2013. Normal EF per echo in June of 2015.  She has had PAF  - now on anticoagulation with Eliquis.   Last seen by me back in July of 2017 - was losing weight - has chronic DOE and fatigue but stable. Seen by Dr. [name] in December - felt to be stable. She has continued to smoke but trying to cut back.   Comes in today. Here alone today. She notes stable DOE. She tells me that for about a month - perhaps a little longer - she has had chest tightness - this is with exertion - relieved with rest. Lasts for a few minutes. Still smoking some. Last A1C under 7. No recent CBC. Lipids from PCP back in April noted. She will still have some leg swelling and bruising - this is stable - no worse. She notes her chest tightness has not gotten any worse but certainly no better. Not dizzy or lightheaded. PPM in place.   Past Medical History:  Diagnosis Date  . Asthma   . Chronic anticoagulation   . COPD (chronic obstructive pulmonary disease) ([place])   . Diabetes mellitus without complication ([place])    TYPE 2  . DJD (degenerative joint disease)   . GERD (gastroesophageal reflux disease)   . History of home oxygen therapy    oxygen 2 liters per nasal cannula at hs  . HTN (hypertension)   . Hyperlipidemia   . Mobitz (type) II atrioventricular block   . Obesity   . Pacemaker    Implanted  by Dr [name] (MDT) 10/06/10    . PAF (paroxysmal atrial fibrillation) ([place])   . Pancreatitis 2010 OR 2011   pt thought this had been ruled out  . Sleep apnea    CPA SETTING OF 14    Past Surgical History:  Procedure Laterality Date  . CATARACT EXTRACTION W/PHACO  11/02/2011   Procedure: CATARACT EXTRACTION PHACO AND INTRAOCULAR LENS PLACEMENT (IOC);  Surgeon: Williams Che;  Location: [facility];  Service: Ophthalmology;  Laterality: Right;  CDE=7.33  . CATARACT EXTRACTION W/PHACO  12/07/2011   Procedure: CATARACT EXTRACTION PHACO AND INTRAOCULAR LENS PLACEMENT (IOC);  Surgeon: Williams Che, MD;  Location: [facility];  Service: Ophthalmology;  Laterality: Left;  CDE 3.61  . COLONOSCOPY WITH PROPOFOL N/A 08/09/2014   Procedure: COLONOSCOPY WITH PROPOFOL;  Surgeon: Juanita Craver, MD;  Location: [facility];  Service: Endoscopy;  Laterality: N/A;  . CYSTOCELE REPAIR    . ESOPHAGOGASTRODUODENOSCOPY (EGD) WITH PROPOFOL N/A 08/09/2014   Procedure: ESOPHAGOGASTRODUODENOSCOPY (EGD) WITH PROPOFOL;  Surgeon: Juanita Craver, MD;  Location: [facility];  Service: Endoscopy;  Laterality: N/A;  . EYE SURGERY  11/01/2012   BOTH EYES CATARACTS  . INSERT / REPLACE / REMOVE PACEMAKER  10/06/10   MDT  implanted by Dr [name]  . KNEE  ARTHROSCOPY     both  . OVARY SURGERY     removal  . REPAIR RECTOCELE    . SIMPLE MASTECTOMY WITH AXILLARY SENTINEL NODE BIOPSY Left 01/09/2015   Procedure: Irrigation and Drainage Abcess left Axilla;  Surgeon: Jackolyn Confer, MD;  Location: [facility];  Service: General;  Laterality: Left;  . TONSILLECTOMY  AGE 67  . TOTAL ABDOMINAL HYSTERECTOMY  1971     Medications: Current Outpatient Prescriptions  Medication Sig Dispense Refill  . albuterol (PROAIR HFA) 108 (90 BASE) MCG/ACT inhaler Inhale 2 puffs into the lungs every 4 (four) hours as needed for wheezing. Inhale 2 puffs into the lungs every 4 (four) hours as needed.    Marland Kitchen amLODipine (NORVASC) 10 MG tablet TAKE ONE TABLET BY MOUTH ONCE DAILY 90 tablet 1   . atorvastatin (LIPITOR) 40 MG tablet Take 40 mg by mouth daily.     . budesonide-formoterol (SYMBICORT) 160-4.5 MCG/ACT inhaler Inhale 2 puffs into the lungs 2 (two) times daily.    . DULoxetine (CYMBALTA) 30 MG capsule Take 2 capsules by mouth at bedtime.    Marland Kitchen ELIQUIS 5 MG TABS tablet TAKE ONE TABLET BY MOUTH TWICE DAILY 60 tablet 5  . fenofibrate 160 MG tablet Take 160 mg by mouth every evening.    . furosemide (LASIX) 20 MG tablet Take 40 mg by mouth daily.     Marland Kitchen glipiZIDE (GLUCOTROL XL) 10 MG 24 hr tablet Take 10 mg by mouth 2 (two) times daily.     Marland Kitchen ipratropium-albuterol (DUONEB) 0.5-2.5 (3) MG/3ML SOLN Take 3 mLs by nebulization 2 (two) times daily.    Marland Kitchen LORazepam (ATIVAN) 0.5 MG tablet Take 1 tablet by mouth every 6 (six) hours as needed for anxiety.     Marland Kitchen losartan (COZAAR) 100 MG tablet Take 100 mg by mouth every evening.    . metFORMIN (GLUCOPHAGE) 1000 MG tablet Take 1,000 mg by mouth 2 (two) times daily with a meal.    . methocarbamol (ROBAXIN) 500 MG tablet Take 1 tablet by mouth every 6 (six) hours as needed for pain.    . metoprolol succinate (TOPROL-XL) 100 MG 24 hr tablet TAKE ONE TABLET BY MOUTH ONCE DAILY 90 tablet 1  . oxybutynin (DITROPAN-XL) 10 MG 24 hr tablet Take 10 mg by mouth daily.     . Probiotic Product (ALIGN PO) Take 1 capsule by mouth daily.     Marland Kitchen spironolactone (ALDACTONE) 25 MG tablet Take 12.5 mg by mouth daily.    . traMADol (ULTRAM) 50 MG tablet Take 1 tablet by mouth every 6 (six) hours as needed for pain.    . nitroGLYCERIN (NITROSTAT) 0.4 MG SL tablet Place 1 tablet (0.4 mg total) under the tongue every 5 (five) minutes as needed for chest pain. 25 tablet 3   No current facility-administered medications for this visit.     Allergies: Allergies  Allergen Reactions  . Bee Venom Swelling  . Latex Swelling    LATEX CATHETERS    Social History: The patient  reports that she has been smoking Cigarettes.  She has a 5.00 pack-year smoking history. She  quit smokeless tobacco use about 5 years ago. She reports that she drinks about 0.6 oz of alcohol per week . She reports that she does not use drugs.   Family History: The patient's family history includes Arthritis in her sister; Cancer in her brother; Diabetes in her brother; Heart disease in her father and mother.   Review of Systems: Please see  the history of present illness.   Otherwise, the review of systems is positive for none.   All other systems are reviewed and negative.   Physical Exam: VS:  BP 138/76 (BP Location: Left Arm, Patient Position: Sitting, Cuff Size: Large)   Pulse 70   Ht 5\' 6"  (1.676 m)   Wt 243 lb 6.4 oz (110.4 kg)   BMI 39.29 kg/m  .  BMI Body mass index is 39.29 kg/m.  Wt Readings from Last 3 Encounters:  05/03/17 243 lb 6.4 oz (110.4 kg)  12/24/16 235 lb 9.6 oz (106.9 kg)  11/04/16 243 lb 3.2 oz (110.3 kg)    General: Pleasant. She is obese. She is alert and in no acute distress. She has put back on 8 pounds since February. Smells a little of tobacco.    HEENT: Normal.  Neck: Supple, no JVD, carotid bruits, or masses noted.  Cardiac: Regular rate and rhythm. No murmurs, rubs, or gallops. No edema.  Respiratory:  Lungs are fairly clear to auscultation bilaterally with normal work of breathing.  GI: Soft and nontender.  MS: No deformity or atrophy. Gait and ROM intact.  Skin: Warm and dry. Color is normal.  Neuro:  Strength and sensation are intact and no gross focal deficits noted.  Psych: Alert, appropriate and with normal affect.   LABORATORY DATA:  EKG:  EKG is ordered today. This demonstrates AV pacing.  Lab Results  Component Value Date   WBC 7.5 01/07/2015   HGB 12.1 01/07/2015   HCT 37.8 01/07/2015   PLT 355 01/07/2015   GLUCOSE 124 (H) 01/07/2015   ALT 16 01/07/2015   AST 20 01/07/2015   NA 138 01/07/2015   K 4.0 01/07/2015   CL 101 01/07/2015   CREATININE 0.82 01/10/2015   BUN 18 01/07/2015   CO2 27 01/07/2015   INR 1.13  01/08/2015     BNP (last 3 results) No results for input(s): BNP in the last 8760 hours.  ProBNP (last 3 results) No results for input(s): PROBNP in the last 8760 hours.   Other Studies Reviewed Today:   Assessment/Plan:  1. Chest tightness - worrisome for angina - multiple CV risk factors with her DM and ongoing smoking as well as HTN, obesity, and HLD. Will arrange for 2 day Lexiscan. NTG sl sent in - she is instructed in how to take. If her symptoms persist, she needs to be back in touch with [place]. Tentatively see back in 6 months unless stress test abnormal or symptoms persist. Labs today as well.   2. Mobitz II second degree AV block - she has PPM in place - last check with atrial lead noise noted but not V dependent - this is being following by EP/device.  3. HTN - BP is fair. No changes made.   3. Obesity - unfortunately, weight is back up.   4. Tobacco abuse - not really ready to stop 100% - stress seems to be a trigger for her smoking  5. PAF - remains on anticoagulation - no recent CBC - labs today.    Current medicines are reviewed with the patient today.  The patient does not have concerns regarding medicines other than what has been noted above.  The following changes have been made:  See above.  Labs/ tests ordered today include:    Orders Placed This Encounter  Procedures  . Basic metabolic panel  . CBC  . Hepatic function panel  . TSH  . Myocardial Perfusion Imaging  .  EKG 12-Lead     Disposition:   FU with Dr. [name] in December - I will see again next June unless as noted above.    Patient is agreeable to this plan and will call if any problems develop in the interim.   SignedTruitt Merle, NP  05/03/2017 9:55 AM  [facility] [address] [place], [place]  [address] Phone: [phone] Fax: [phone]

## 2017-05-03 NOTE — Patient Instructions (Addendum)
We will be checking the following labs today - BMET, CBC, HPF and TSH   Medication Instructions:    Continue with your current medicines. BUT I have sent in a RX for NTG - Use your NTG under your tongue for recurrent chest pain. May take one tablet every 5 minutes. If you are still having discomfort after 3 tablets in 15 minutes, call 911.     Testing/Procedures To Be Arranged:  2 day lexiscan Myoview  Follow-Up:   See Dr. [name] in 6 months - but call us back sooner if your chest pain persists no matter what stress test shows    Other Special Instructions:   Keep working on your smoking.     If you need a refill on your cardiac medications before your next appointment, please call your pharmacy.   Call the [place] office at [phone] if you have any questions, problems or concerns.

## 2017-05-05 ENCOUNTER — Encounter: Payer: Medicare Other | Admitting: *Deleted

## 2017-05-07 ENCOUNTER — Encounter: Payer: Self-pay | Admitting: Cardiology

## 2017-05-10 ENCOUNTER — Ambulatory Visit (INDEPENDENT_AMBULATORY_CARE_PROVIDER_SITE_OTHER): Payer: Medicare Other | Admitting: *Deleted

## 2017-05-10 ENCOUNTER — Telehealth (HOSPITAL_COMMUNITY): Payer: Self-pay | Admitting: *Deleted

## 2017-05-10 DIAGNOSIS — I441 Atrioventricular block, second degree: Secondary | ICD-10-CM | POA: Diagnosis not present

## 2017-05-10 NOTE — Telephone Encounter (Signed)
Patient given detailed instructions per Myocardial Perfusion Study Information Sheet for the test on 05/12/17 at 1245. Patient notified to arrive 15 minutes early and that it is imperative to arrive on time for appointment to keep from having the test rescheduled.  If you need to cancel or reschedule your appointment, please call the office within 24 hours of your appointment. . Patient verbalized understanding.Yohanna Tow, Ranae [place]

## 2017-05-12 ENCOUNTER — Ambulatory Visit (HOSPITAL_COMMUNITY): Payer: Medicare Other | Attending: Cardiovascular Disease

## 2017-05-12 ENCOUNTER — Encounter: Payer: Self-pay | Admitting: Cardiology

## 2017-05-12 DIAGNOSIS — R5383 Other fatigue: Secondary | ICD-10-CM | POA: Insufficient documentation

## 2017-05-12 DIAGNOSIS — I4891 Unspecified atrial fibrillation: Secondary | ICD-10-CM | POA: Insufficient documentation

## 2017-05-12 DIAGNOSIS — I10 Essential (primary) hypertension: Secondary | ICD-10-CM | POA: Diagnosis not present

## 2017-05-12 DIAGNOSIS — R0609 Other forms of dyspnea: Secondary | ICD-10-CM | POA: Diagnosis not present

## 2017-05-12 DIAGNOSIS — Z79899 Other long term (current) drug therapy: Secondary | ICD-10-CM

## 2017-05-12 DIAGNOSIS — R0789 Other chest pain: Secondary | ICD-10-CM

## 2017-05-12 MED ORDER — TECHNETIUM TC 99M TETROFOSMIN IV KIT
32.9000 | PACK | Freq: Once | INTRAVENOUS | Status: AC | PRN
  Administered 2017-05-12: 32.9 via INTRAVENOUS
  Filled 2017-05-12: qty 33

## 2017-05-12 MED ORDER — REGADENOSON 0.4 MG/5ML IV SOLN
0.4000 mg | Freq: Once | INTRAVENOUS | Status: AC
  Administered 2017-05-12: 0.4 mg via INTRAVENOUS

## 2017-05-12 NOTE — Progress Notes (Signed)
Remote pacemaker transmission.   

## 2017-05-13 ENCOUNTER — Ambulatory Visit (HOSPITAL_COMMUNITY): Payer: Medicare Other | Attending: Cardiology

## 2017-05-13 LAB — MYOCARDIAL PERFUSION IMAGING
LV dias vol: 106 mL (ref 46–106)
LV sys vol: 37 mL
Peak HR: 62 {beats}/min
RATE: 0.25
Rest HR: 61 {beats}/min
SDS: 0
SRS: 4
SSS: 4
TID: 0.74

## 2017-05-13 MED ORDER — TECHNETIUM TC 99M TETROFOSMIN IV KIT
31.9000 | PACK | Freq: Once | INTRAVENOUS | Status: AC | PRN
  Administered 2017-05-13: 31.9 via INTRAVENOUS
  Filled 2017-05-13: qty 32

## 2017-05-17 LAB — CUP PACEART REMOTE DEVICE CHECK
Battery Impedance: 521 Ohm
Battery Remaining Longevity: 77 mo
Brady Statistic AP VS Percent: 0 %
Brady Statistic AS VS Percent: 13 %
Implantable Lead Implant Date: 20111121
Implantable Lead Implant Date: 20111121
Implantable Lead Location: 753860
Implantable Lead Model: 4469
Implantable Lead Model: 4470
Implantable Lead Serial Number: 687643
Lead Channel Impedance Value: 442 Ohm
Lead Channel Impedance Value: 509 Ohm
Lead Channel Pacing Threshold Amplitude: 0.625 V
Lead Channel Pacing Threshold Pulse Width: 0.4 ms
Lead Channel Setting Pacing Amplitude: 2 V
MDC IDC LEAD LOCATION: 753859
MDC IDC LEAD SERIAL: 548226
MDC IDC MSMT BATTERY VOLTAGE: 2.77 V
MDC IDC PG IMPLANT DT: 20111121
MDC IDC SESS DTM: 20180625141144
MDC IDC SET LEADCHNL RV PACING AMPLITUDE: 2.5 V
MDC IDC SET LEADCHNL RV PACING PULSEWIDTH: 0.4 ms
MDC IDC SET LEADCHNL RV SENSING SENSITIVITY: 4 mV
MDC IDC STAT BRADY AP VP PERCENT: 77 %
MDC IDC STAT BRADY AS VP PERCENT: 10 %

## 2017-06-11 ENCOUNTER — Telehealth: Payer: Self-pay | Admitting: Pharmacist

## 2017-06-11 NOTE — Telephone Encounter (Signed)
Received fax from [facility] that pt is scheduled for a lumbar ESI on 07/13/17 with request to hold Eliquis for 3 days prior. Pt takes Eliquis for afib with CHADS2 score of 2 (HTN, DM) and CHADS2VASc score of 4 (age, sex, HTN, DM). Ok to hold Eliquis for 3 days prior. Clearance faxed to [phone].

## 2017-07-08 ENCOUNTER — Other Ambulatory Visit: Payer: Self-pay | Admitting: Nurse Practitioner

## 2017-07-08 DIAGNOSIS — Z1231 Encounter for screening mammogram for malignant neoplasm of breast: Secondary | ICD-10-CM

## 2017-07-22 ENCOUNTER — Ambulatory Visit
Admission: RE | Admit: 2017-07-22 | Discharge: 2017-07-22 | Disposition: A | Discharge status: Home / Self Care | Payer: Medicare Other | Source: Ambulatory Visit | Attending: Nurse Practitioner | Admitting: Nurse Practitioner

## 2017-07-22 DIAGNOSIS — Z1231 Encounter for screening mammogram for malignant neoplasm of breast: Secondary | ICD-10-CM

## 2017-08-11 ENCOUNTER — Telehealth: Payer: Self-pay | Admitting: Cardiology

## 2017-08-11 ENCOUNTER — Encounter: Payer: Medicare Other | Admitting: *Deleted

## 2017-08-11 NOTE — Telephone Encounter (Signed)
LMOVM reminding pt to send remote transmission.   

## 2017-08-13 ENCOUNTER — Encounter: Payer: Self-pay | Admitting: Cardiology

## 2017-08-19 ENCOUNTER — Ambulatory Visit (INDEPENDENT_AMBULATORY_CARE_PROVIDER_SITE_OTHER): Payer: Medicare Other | Admitting: *Deleted

## 2017-08-19 DIAGNOSIS — I441 Atrioventricular block, second degree: Secondary | ICD-10-CM | POA: Diagnosis not present

## 2017-08-23 NOTE — Progress Notes (Signed)
Remote pacemaker transmission.   

## 2017-08-24 LAB — CUP PACEART REMOTE DEVICE CHECK
Battery Impedance: 520 Ohm
Battery Voltage: 2.77 V
Brady Statistic AP VS Percent: 0 %
Brady Statistic AS VP Percent: 10 %
Brady Statistic AS VS Percent: 11 %
Implantable Lead Implant Date: 20111121
Implantable Lead Location: 753859
Implantable Lead Model: 4469
Implantable Lead Model: 4470
Implantable Lead Serial Number: 687643
Lead Channel Impedance Value: 442 Ohm
Lead Channel Impedance Value: 500 Ohm
Lead Channel Pacing Threshold Pulse Width: 0.4 ms
Lead Channel Sensing Intrinsic Amplitude: 2.8 mV
Lead Channel Setting Pacing Amplitude: 2 V
Lead Channel Setting Pacing Pulse Width: 0.4 ms
MDC IDC LEAD IMPLANT DT: 20111121
MDC IDC LEAD LOCATION: 753860
MDC IDC LEAD SERIAL: 548226
MDC IDC MSMT BATTERY REMAINING LONGEVITY: 77 mo
MDC IDC MSMT LEADCHNL RV PACING THRESHOLD AMPLITUDE: 0.75 V
MDC IDC PG IMPLANT DT: 20111121
MDC IDC SESS DTM: 20181005030931
MDC IDC SET LEADCHNL RV PACING AMPLITUDE: 2.5 V
MDC IDC SET LEADCHNL RV SENSING SENSITIVITY: 4 mV
MDC IDC STAT BRADY AP VP PERCENT: 79 %

## 2017-08-26 ENCOUNTER — Encounter: Payer: Self-pay | Admitting: Cardiology

## 2017-09-26 ENCOUNTER — Emergency Department (HOSPITAL_COMMUNITY): Payer: Medicare Other

## 2017-09-26 ENCOUNTER — Inpatient Hospital Stay (HOSPITAL_COMMUNITY)
Admission: EM | Admit: 2017-09-26 | Discharge: 2017-09-28 | DRG: 190 | Disposition: A | Discharge status: Home / Self Care | Payer: Medicare Other | Attending: Family Medicine | Admitting: Family Medicine

## 2017-09-26 ENCOUNTER — Other Ambulatory Visit: Payer: Self-pay

## 2017-09-26 ENCOUNTER — Encounter (HOSPITAL_COMMUNITY): Payer: Self-pay | Admitting: Emergency Medicine

## 2017-09-26 DIAGNOSIS — K219 Gastro-esophageal reflux disease without esophagitis: Secondary | ICD-10-CM | POA: Diagnosis present

## 2017-09-26 DIAGNOSIS — E119 Type 2 diabetes mellitus without complications: Secondary | ICD-10-CM

## 2017-09-26 DIAGNOSIS — Z9989 Dependence on other enabling machines and devices: Secondary | ICD-10-CM | POA: Diagnosis not present

## 2017-09-26 DIAGNOSIS — F17210 Nicotine dependence, cigarettes, uncomplicated: Secondary | ICD-10-CM | POA: Diagnosis present

## 2017-09-26 DIAGNOSIS — I4821 Permanent atrial fibrillation: Secondary | ICD-10-CM | POA: Diagnosis present

## 2017-09-26 DIAGNOSIS — J441 Chronic obstructive pulmonary disease with (acute) exacerbation: Principal | ICD-10-CM

## 2017-09-26 DIAGNOSIS — R0603 Acute respiratory distress: Secondary | ICD-10-CM

## 2017-09-26 DIAGNOSIS — G4733 Obstructive sleep apnea (adult) (pediatric): Secondary | ICD-10-CM | POA: Diagnosis present

## 2017-09-26 DIAGNOSIS — J9621 Acute and chronic respiratory failure with hypoxia: Secondary | ICD-10-CM | POA: Diagnosis present

## 2017-09-26 DIAGNOSIS — E1165 Type 2 diabetes mellitus with hyperglycemia: Secondary | ICD-10-CM | POA: Diagnosis present

## 2017-09-26 DIAGNOSIS — E1142 Type 2 diabetes mellitus with diabetic polyneuropathy: Secondary | ICD-10-CM | POA: Diagnosis present

## 2017-09-26 DIAGNOSIS — Z7901 Long term (current) use of anticoagulants: Secondary | ICD-10-CM

## 2017-09-26 DIAGNOSIS — Z91030 Bee allergy status: Secondary | ICD-10-CM

## 2017-09-26 DIAGNOSIS — Z66 Do not resuscitate: Secondary | ICD-10-CM | POA: Diagnosis present

## 2017-09-26 DIAGNOSIS — Z91040 Latex allergy status: Secondary | ICD-10-CM

## 2017-09-26 DIAGNOSIS — K589 Irritable bowel syndrome without diarrhea: Secondary | ICD-10-CM | POA: Diagnosis present

## 2017-09-26 DIAGNOSIS — I480 Paroxysmal atrial fibrillation: Secondary | ICD-10-CM

## 2017-09-26 DIAGNOSIS — E785 Hyperlipidemia, unspecified: Secondary | ICD-10-CM | POA: Diagnosis present

## 2017-09-26 DIAGNOSIS — R0902 Hypoxemia: Secondary | ICD-10-CM

## 2017-09-26 DIAGNOSIS — I10 Essential (primary) hypertension: Secondary | ICD-10-CM | POA: Diagnosis not present

## 2017-09-26 DIAGNOSIS — I5032 Chronic diastolic (congestive) heart failure: Secondary | ICD-10-CM | POA: Diagnosis present

## 2017-09-26 DIAGNOSIS — E1149 Type 2 diabetes mellitus with other diabetic neurological complication: Secondary | ICD-10-CM | POA: Diagnosis not present

## 2017-09-26 DIAGNOSIS — Z9981 Dependence on supplemental oxygen: Secondary | ICD-10-CM | POA: Diagnosis not present

## 2017-09-26 DIAGNOSIS — I5033 Acute on chronic diastolic (congestive) heart failure: Secondary | ICD-10-CM | POA: Diagnosis present

## 2017-09-26 DIAGNOSIS — Z7984 Long term (current) use of oral hypoglycemic drugs: Secondary | ICD-10-CM

## 2017-09-26 DIAGNOSIS — Z950 Presence of cardiac pacemaker: Secondary | ICD-10-CM | POA: Diagnosis not present

## 2017-09-26 DIAGNOSIS — J449 Chronic obstructive pulmonary disease, unspecified: Secondary | ICD-10-CM | POA: Diagnosis present

## 2017-09-26 DIAGNOSIS — J4489 Other specified chronic obstructive pulmonary disease: Secondary | ICD-10-CM | POA: Diagnosis present

## 2017-09-26 DIAGNOSIS — E1140 Type 2 diabetes mellitus with diabetic neuropathy, unspecified: Secondary | ICD-10-CM | POA: Diagnosis present

## 2017-09-26 DIAGNOSIS — I110 Hypertensive heart disease with heart failure: Secondary | ICD-10-CM | POA: Diagnosis present

## 2017-09-26 LAB — CBC WITH DIFFERENTIAL/PLATELET
BASOS ABS: 0 10*3/uL (ref 0.0–0.1)
Basophils Relative: 0 %
Eosinophils Absolute: 0 10*3/uL (ref 0.0–0.7)
Eosinophils Relative: 1 %
HEMATOCRIT: 40.7 % (ref 36.0–46.0)
Hemoglobin: 12.9 g/dL (ref 12.0–15.0)
LYMPHS ABS: 1.1 10*3/uL (ref 0.7–4.0)
LYMPHS PCT: 17 %
MCH: 28.6 pg (ref 26.0–34.0)
MCHC: 31.7 g/dL (ref 30.0–36.0)
MCV: 90.2 fL (ref 78.0–100.0)
MONO ABS: 0.4 10*3/uL (ref 0.1–1.0)
MONOS PCT: 6 %
NEUTROS ABS: 5 10*3/uL (ref 1.7–7.7)
Neutrophils Relative %: 76 %
Platelets: 240 10*3/uL (ref 150–400)
RBC: 4.51 MIL/uL (ref 3.87–5.11)
RDW: 13.3 % (ref 11.5–15.5)
WBC: 6.5 10*3/uL (ref 4.0–10.5)

## 2017-09-26 LAB — COMPREHENSIVE METABOLIC PANEL
ALT: 26 U/L (ref 14–54)
AST: 27 U/L (ref 15–41)
Albumin: 3.8 g/dL (ref 3.5–5.0)
Alkaline Phosphatase: 52 U/L (ref 38–126)
Anion gap: 11 (ref 5–15)
BILIRUBIN TOTAL: 0.8 mg/dL (ref 0.3–1.2)
BUN: 18 mg/dL (ref 6–20)
CO2: 29 mmol/L (ref 22–32)
CREATININE: 0.7 mg/dL (ref 0.44–1.00)
Calcium: 9.2 mg/dL (ref 8.9–10.3)
Chloride: 99 mmol/L — ABNORMAL LOW (ref 101–111)
GFR calc Af Amer: >60 mL/min (ref >60)
Glucose, Bld: 185 mg/dL — ABNORMAL HIGH (ref 65–99)
POTASSIUM: 3.7 mmol/L (ref 3.5–5.1)
Sodium: 139 mmol/L (ref 135–145)
TOTAL PROTEIN: 7.2 g/dL (ref 6.5–8.1)

## 2017-09-26 LAB — GLUCOSE, CAPILLARY
GLUCOSE-CAPILLARY: 328 mg/dL — AB (ref 65–99)
GLUCOSE-CAPILLARY: 323 mg/dL — AB (ref 65–99)

## 2017-09-26 LAB — TROPONIN I

## 2017-09-26 MED ORDER — INSULIN ASPART 100 UNIT/ML ~~LOC~~ SOLN
4.0000 [IU] | SUBCUTANEOUS | Status: AC
  Administered 2017-09-26: 4 [IU] via SUBCUTANEOUS

## 2017-09-26 MED ORDER — BUDESONIDE 0.25 MG/2ML IN SUSP
0.2500 mg | Freq: Two times a day (BID) | RESPIRATORY_TRACT | Status: DC
  Administered 2017-09-26 – 2017-09-28 (×4): 0.25 mg via RESPIRATORY_TRACT
  Filled 2017-09-26 (×4): qty 2

## 2017-09-26 MED ORDER — INSULIN ASPART 100 UNIT/ML ~~LOC~~ SOLN
0.0000 [IU] | Freq: Three times a day (TID) | SUBCUTANEOUS | Status: DC
  Administered 2017-09-26: 15 [IU] via SUBCUTANEOUS
  Administered 2017-09-27: 4 [IU] via SUBCUTANEOUS
  Administered 2017-09-27: 15 [IU] via SUBCUTANEOUS
  Administered 2017-09-27: 4 [IU] via SUBCUTANEOUS
  Administered 2017-09-28 (×2): 7 [IU] via SUBCUTANEOUS

## 2017-09-26 MED ORDER — ARFORMOTEROL TARTRATE 15 MCG/2ML IN NEBU
15.0000 ug | INHALATION_SOLUTION | Freq: Two times a day (BID) | RESPIRATORY_TRACT | Status: DC
  Administered 2017-09-27 – 2017-09-28 (×3): 15 ug via RESPIRATORY_TRACT
  Filled 2017-09-26 (×3): qty 2

## 2017-09-26 MED ORDER — DOXYCYCLINE HYCLATE 100 MG IV SOLR
INTRAVENOUS | Status: AC
  Filled 2017-09-26: qty 100

## 2017-09-26 MED ORDER — AMLODIPINE BESYLATE 5 MG PO TABS
10.0000 mg | ORAL_TABLET | Freq: Every day | ORAL | Status: DC
  Administered 2017-09-26 – 2017-09-28 (×3): 10 mg via ORAL
  Filled 2017-09-26 (×3): qty 2

## 2017-09-26 MED ORDER — LOSARTAN POTASSIUM 50 MG PO TABS
100.0000 mg | ORAL_TABLET | Freq: Every evening | ORAL | Status: DC
  Administered 2017-09-26 – 2017-09-27 (×2): 100 mg via ORAL
  Filled 2017-09-26 (×2): qty 2

## 2017-09-26 MED ORDER — NITROGLYCERIN 0.4 MG SL SUBL: 0.4000 mg | SUBLINGUAL_TABLET | SUBLINGUAL | Status: DC | PRN

## 2017-09-26 MED ORDER — APIXABAN 5 MG PO TABS
5.0000 mg | ORAL_TABLET | Freq: Two times a day (BID) | ORAL | Status: DC
  Administered 2017-09-26 – 2017-09-28 (×5): 5 mg via ORAL
  Filled 2017-09-26 (×5): qty 1

## 2017-09-26 MED ORDER — FUROSEMIDE 40 MG PO TABS
40.0000 mg | ORAL_TABLET | Freq: Every day | ORAL | Status: DC
  Administered 2017-09-26 – 2017-09-28 (×3): 40 mg via ORAL
  Filled 2017-09-26 (×3): qty 1

## 2017-09-26 MED ORDER — SPIRONOLACTONE 25 MG PO TABS
12.5000 mg | ORAL_TABLET | Freq: Every day | ORAL | Status: DC
  Administered 2017-09-26 – 2017-09-28 (×3): 12.5 mg via ORAL
  Filled 2017-09-26 (×3): qty 1

## 2017-09-26 MED ORDER — LORAZEPAM 0.5 MG PO TABS
0.5000 mg | ORAL_TABLET | Freq: Four times a day (QID) | ORAL | Status: DC | PRN
  Administered 2017-09-26: 0.5 mg via ORAL
  Filled 2017-09-26: qty 1

## 2017-09-26 MED ORDER — ACETAMINOPHEN 325 MG PO TABS: 650.0000 mg | ORAL_TABLET | Freq: Four times a day (QID) | ORAL | Status: DC | PRN

## 2017-09-26 MED ORDER — OXYBUTYNIN CHLORIDE ER 5 MG PO TB24
10.0000 mg | ORAL_TABLET | Freq: Every day | ORAL | Status: DC
  Administered 2017-09-26 – 2017-09-28 (×3): 10 mg via ORAL
  Filled 2017-09-26 (×3): qty 2

## 2017-09-26 MED ORDER — NICOTINE 21 MG/24HR TD PT24
21.0000 mg | MEDICATED_PATCH | Freq: Every day | TRANSDERMAL | Status: DC
  Administered 2017-09-26 – 2017-09-28 (×3): 21 mg via TRANSDERMAL
  Filled 2017-09-26 (×4): qty 1

## 2017-09-26 MED ORDER — DEXTROSE 5 % IV SOLN
500.0000 mg | Freq: Once | INTRAVENOUS | Status: AC
  Administered 2017-09-26: 500 mg via INTRAVENOUS
  Filled 2017-09-26: qty 500

## 2017-09-26 MED ORDER — ONDANSETRON HCL 4 MG/2ML IJ SOLN: 4.0000 mg | Freq: Four times a day (QID) | INTRAMUSCULAR | Status: DC | PRN

## 2017-09-26 MED ORDER — IPRATROPIUM-ALBUTEROL 0.5-2.5 (3) MG/3ML IN SOLN
3.0000 mL | RESPIRATORY_TRACT | Status: DC
  Administered 2017-09-26: 3 mL via RESPIRATORY_TRACT
  Filled 2017-09-26: qty 3

## 2017-09-26 MED ORDER — INSULIN ASPART 100 UNIT/ML ~~LOC~~ SOLN: 6.0000 [IU] | Freq: Three times a day (TID) | SUBCUTANEOUS | Status: DC

## 2017-09-26 MED ORDER — ALBUTEROL (5 MG/ML) CONTINUOUS INHALATION SOLN
15.0000 mg/h | INHALATION_SOLUTION | RESPIRATORY_TRACT | Status: DC
  Administered 2017-09-26: 15 mg/h via RESPIRATORY_TRACT
  Filled 2017-09-26: qty 20

## 2017-09-26 MED ORDER — ACETAMINOPHEN 650 MG RE SUPP: 650.0000 mg | Freq: Four times a day (QID) | RECTAL | Status: DC | PRN

## 2017-09-26 MED ORDER — HYDROCOD POLST-CPM POLST ER 10-8 MG/5ML PO SUER: 5.0000 mL | Freq: Two times a day (BID) | ORAL | Status: DC | PRN

## 2017-09-26 MED ORDER — METHYLPREDNISOLONE SODIUM SUCC 40 MG IJ SOLR
40.0000 mg | INTRAMUSCULAR | Status: DC
  Administered 2017-09-26 – 2017-09-27 (×5): 40 mg via INTRAVENOUS
  Filled 2017-09-26 (×5): qty 1

## 2017-09-26 MED ORDER — FENOFIBRATE 160 MG PO TABS
160.0000 mg | ORAL_TABLET | Freq: Every evening | ORAL | Status: DC
  Administered 2017-09-26 – 2017-09-27 (×2): 160 mg via ORAL
  Filled 2017-09-26 (×2): qty 1

## 2017-09-26 MED ORDER — METOPROLOL SUCCINATE ER 50 MG PO TB24
100.0000 mg | ORAL_TABLET | Freq: Every day | ORAL | Status: DC
  Administered 2017-09-26 – 2017-09-28 (×3): 100 mg via ORAL
  Filled 2017-09-26 (×3): qty 2

## 2017-09-26 MED ORDER — TRAZODONE HCL 50 MG PO TABS: 25.0000 mg | ORAL_TABLET | Freq: Every evening | ORAL | Status: DC | PRN

## 2017-09-26 MED ORDER — INSULIN ASPART 100 UNIT/ML ~~LOC~~ SOLN
5.0000 [IU] | Freq: Three times a day (TID) | SUBCUTANEOUS | Status: DC
  Administered 2017-09-26: 5 [IU] via SUBCUTANEOUS

## 2017-09-26 MED ORDER — ONDANSETRON HCL 4 MG PO TABS: 4.0000 mg | ORAL_TABLET | Freq: Four times a day (QID) | ORAL | Status: DC | PRN

## 2017-09-26 MED ORDER — SENNOSIDES-DOCUSATE SODIUM 8.6-50 MG PO TABS: 1.0000 | ORAL_TABLET | Freq: Every evening | ORAL | Status: DC | PRN

## 2017-09-26 MED ORDER — ATORVASTATIN CALCIUM 40 MG PO TABS
40.0000 mg | ORAL_TABLET | Freq: Every day | ORAL | Status: DC
  Administered 2017-09-26 – 2017-09-28 (×3): 40 mg via ORAL
  Filled 2017-09-26 (×3): qty 1

## 2017-09-26 MED ORDER — DOXYCYCLINE HYCLATE 100 MG IV SOLR
100.0000 mg | Freq: Two times a day (BID) | INTRAVENOUS | Status: DC
  Administered 2017-09-26 – 2017-09-28 (×4): 100 mg via INTRAVENOUS
  Filled 2017-09-26 (×4): qty 100

## 2017-09-26 MED ORDER — TRAMADOL HCL 50 MG PO TABS: 50.0000 mg | ORAL_TABLET | Freq: Four times a day (QID) | ORAL | Status: DC | PRN

## 2017-09-26 MED ORDER — METHOCARBAMOL 500 MG PO TABS: 500.0000 mg | ORAL_TABLET | Freq: Four times a day (QID) | ORAL | Status: DC | PRN

## 2017-09-26 MED ORDER — DEXTROSE 5 % IV SOLN: 100.0000 mg | Freq: Two times a day (BID) | INTRAVENOUS | Status: DC

## 2017-09-26 NOTE — ED Triage Notes (Signed)
Patient brought in via EMS from Urgent Care. O2 sat was 70% on room air before any treatments. Patient received 125mg  solumedrol Iv, duoneb, and al;buterol treatment. O2 sat 96% on 2 liters via [place]. Patient states that she was being seen at urgent care for shortness of breath, cough, and wheezing. Patient has hx of COPD/bronchitis and Afib. Patient uses neb treatments and inhaler at home with 02 at night. Denies any fevers. Per patient cough and shortness of breath started Friday. Denies cough being productive.

## 2017-09-26 NOTE — H&P (Signed)
History and Physical  Brandi Hunter VQM:[id number] DOB: 09-10-1941 DOA: 09/26/2017  Referring physician: [name], MD PCP: Brandi Aly, FNP   Chief Complaint: SOB  HPI: Brandi Hunter is a 76 y.o. female longtime smoker with known oxygen dependent COPD presented to the emergency department with shortness of breath.  The patient had been seen earlier at an urgent care where she was found to be hypoxic with a pulse ox of 74% on room air with a significantly diminished breath sounds and tachypnea.  She was transferred to the ED for further evaluation.  The patient reports that for the past couple of days she has had progressive shortness of breath and cough.  She normally uses nocturnal oxygen but has been using it more frequently recently.  Patient reports cough.  The patient denies fever and chills.  EMS transported patient to ED.  They provided her with supplemental oxygen and Solu-Medrol.  The patient was given breathing treatments in the emergency department but continued to have diffuse wheezing shortness of breath and hypoxia.  Hospitalization was requested for further treatment.  Review of Systems: All systems reviewed and apart from history of presenting illness, are negative.  Past Medical History:  Diagnosis Date  . Asthma   . Chronic anticoagulation   . COPD (chronic obstructive pulmonary disease) ([place])   . Diabetes mellitus without complication ([place])    TYPE 2  . DJD (degenerative joint disease)   . GERD (gastroesophageal reflux disease)   . History of home oxygen therapy    oxygen 2 liters per nasal cannula at hs  . HTN (hypertension)   . Hyperlipidemia   . Mobitz (type) II atrioventricular block   . Obesity   . Pacemaker    Implanted  by Dr Brandi Hunter (MDT) 10/06/10  . PAF (paroxysmal atrial fibrillation) ([place])   . Pancreatitis 2010 OR 2011   pt thought this had been ruled out  . Sleep apnea    CPA SETTING OF 14   Past Surgical History:  Procedure Laterality Date  .  CYSTOCELE REPAIR    . EYE SURGERY  11/01/2012   BOTH EYES CATARACTS  . INSERT / REPLACE / REMOVE PACEMAKER  10/06/10   MDT  implanted by Dr Brandi Hunter  . KNEE ARTHROSCOPY     both  . OVARY SURGERY     removal  . REPAIR RECTOCELE    . TONSILLECTOMY  AGE 65  . TOTAL ABDOMINAL HYSTERECTOMY  1971   Social History:  reports that she has been smoking cigarettes.  She has a 50 pack-year smoking history. She quit smokeless tobacco use about 5 years ago. She reports that she drinks about 0.6 oz of alcohol per week. She reports that she does not use drugs.  Allergies  Allergen Reactions  . Bee Venom Swelling  . Latex Swelling    LATEX CATHETERS    Family History  Problem Relation Age of Onset  . Heart disease Mother        CHF  . Heart disease Father        CHF  . Arthritis Sister        osteo  . Cancer Brother        prostate  . Diabetes Brother   . Anesthesia problems Neg Hx   . Hypotension Neg Hx   . Malignant hyperthermia Neg Hx   . Pseudochol deficiency Neg Hx     Prior to Admission medications   Medication Sig Start Date End Date Taking?  Authorizing Provider  albuterol (PROAIR HFA) 108 (90 BASE) MCG/ACT inhaler Inhale 2 puffs into the lungs every 4 (four) hours as needed for wheezing. Inhale 2 puffs into the lungs every 4 (four) hours as needed. 07/21/12   [provider]  amLODipine (NORVASC) 10 MG tablet TAKE ONE TABLET BY MOUTH ONCE DAILY 03/23/17   [name], [name], MD  atorvastatin (LIPITOR) 40 MG tablet Take 40 mg by mouth daily.  03/17/13   [provider]  budesonide-formoterol (SYMBICORT) 160-4.5 MCG/ACT inhaler Inhale 2 puffs into the lungs 2 (two) times daily.    [provider]  ELIQUIS 5 MG TABS tablet TAKE ONE TABLET BY MOUTH TWICE DAILY 11/23/16   [name], [name], MD  fenofibrate 160 MG tablet Take 160 mg by mouth every evening.    [provider]  furosemide (LASIX) 20 MG tablet Take 40 mg by mouth daily.     [provider]    glipiZIDE (GLUCOTROL XL) 10 MG 24 hr tablet Take 10 mg by mouth 2 (two) times daily.  08/20/13   [provider]  ipratropium-albuterol (DUONEB) 0.5-2.5 (3) MG/3ML SOLN Take 3 mLs by nebulization 2 (two) times daily.    [provider]  LORazepam (ATIVAN) 0.5 MG tablet Take 1 tablet by mouth every 6 (six) hours as needed for anxiety.  04/28/13   [provider]  losartan (COZAAR) 100 MG tablet Take 100 mg by mouth every evening.    [provider]  metFORMIN (GLUCOPHAGE) 1000 MG tablet Take 1,000 mg by mouth 2 (two) times daily with a meal.    [provider]  methocarbamol (ROBAXIN) 500 MG tablet Take 1 tablet by mouth every 6 (six) hours as needed for pain.    [provider]  metoprolol succinate (TOPROL-XL) 100 MG 24 hr tablet TAKE ONE TABLET BY MOUTH ONCE DAILY 03/23/17   [name], [name], MD  nitroGLYCERIN (NITROSTAT) 0.4 MG SL tablet Place 1 tablet (0.4 mg total) under the tongue every 5 (five) minutes as needed for chest pain. 05/03/17 08/01/17  [name], NP  oxybutynin (DITROPAN-XL) 10 MG 24 hr tablet Take 10 mg by mouth daily.  07/19/13   [provider]  Probiotic Product (ALIGN PO) Take 1 capsule by mouth daily.     [provider]  spironolactone (ALDACTONE) 25 MG tablet Take 12.5 mg by mouth daily.    [provider]  traMADol (ULTRAM) 50 MG tablet Take 1 tablet by mouth every 6 (six) hours as needed for pain. 10/24/16   [provider]   Physical Exam: Vitals:   09/26/17 1306 09/26/17 1315 09/26/17 1400 09/26/17 1439  BP:   (!) 143/55   Pulse:  61 64   Resp:  15 18   Temp:      TempSrc:      SpO2: 97% 100% 94% 96%  Weight:      Height:         General exam: Moderately built and nourished patient, lying comfortably supine on the gurney in no obvious distress.  Head, eyes and ENT: Nontraumatic and normocephalic. Pupils equally reacting to light and accommodation. Oral mucosa  moist.  Neck: Supple. No JVD, carotid bruit or thyromegaly.  Lymphatics: No lymphadenopathy.  Respiratory system: diffuse insp/exp wheezes with tachypnea.   Cardiovascular system: S1 and S2 heard, tachycardic.  Gastrointestinal system: Abdomen is nondistended, soft and nontender. Normal bowel sounds heard. No organomegaly or masses appreciated.  Central nervous system: Alert and oriented. No focal neurological  deficits.  Extremities: Symmetric 5 x 5 power. Peripheral pulses symmetrically felt.   Skin: No rashes or acute findings.  Musculoskeletal system: Negative exam.  Psychiatry: Pleasant and cooperative.  Labs on Admission:  Basic Metabolic Panel: Recent Labs  Lab 09/26/17 1233  NA 139  K 3.7  CL 99*  CO2 29  GLUCOSE 185*  BUN 18  CREATININE 0.70  CALCIUM 9.2   Liver Function Tests: Recent Labs  Lab 09/26/17 1233  AST 27  ALT 26  ALKPHOS 52  BILITOT 0.8  PROT 7.2  ALBUMIN 3.8   No results for input(s): LIPASE, AMYLASE in the last 168 hours. No results for input(s): AMMONIA in the last 168 hours. CBC: Recent Labs  Lab 09/26/17 1233  WBC 6.5  NEUTROABS 5.0  HGB 12.9  HCT 40.7  MCV 90.2  PLT 240   Cardiac Enzymes: Recent Labs  Lab 09/26/17 1233  TROPONINI <0.03    BNP (last 3 results) No results for input(s): PROBNP in the last 8760 hours. CBG: No results for input(s): GLUCAP in the last 168 hours.  Radiological Exams on Admission: Dg Chest 2 View  Result Date: 09/26/2017 CLINICAL DATA:  Shortness of breath with cough and wheezing EXAM: CHEST  2 VIEW COMPARISON:  Chest radiograph August 26, 2012 and chest CT May 23, 2014 FINDINGS: There is no edema or consolidation. Heart is upper normal in size with pulmonary vascularity within normal limits. Pacemaker leads are attached to the right atrium and middle cardiac vein. No adenopathy. No evident bone lesions. IMPRESSION: No edema or consolidation. Stable cardiac silhouette. Pacemaker leads  attached to right atrium and middle cardiac vein. Electronically Signed   By: Lowella Grip III M.D.   On: 09/26/2017 13:05   Assessment/Plan Principal Problem:   Acute exacerbation of chronic obstructive pulmonary disease (COPD) ([place]) Active Problems:   Acute respiratory distress   HTN (hypertension)   IBS (irritable bowel syndrome)   OSA (obstructive sleep apnea)   Chronic asthmatic bronchitis (HCC)   Diabetic neuropathy (HCC)   Paroxysmal atrial fibrillation (HCC)   Dependence on nocturnal oxygen therapy   Type 2 diabetes mellitus (HCC)   Chronic diastolic heart failure ([place])  1. Acute exacerbation of chronic obstructive pulmonary disease -aggressive treatments.  Will initiate IV Solu-Medrol around-the-clock, scheduled nebulizer treatments, continue supplemental oxygen therapy. 2. Acute respiratory distress with hypoxia-continue supplemental oxygen therapy. 3. Essential Hypertension-resume home medications and follow closely. 4. Chronic active nicotine dependence-counseled at bedside on tobacco cessation and will provide a nicotine patch to use in the hospital. 5. COPD with oxygen dependence- acute exacerbation treatment as above 6. Chronic diastolic heart failure-currently compensated, resume home medications. 7. OSA-will offer nightly CPAP treatment in the hospital. 8. Type 2 diabetes mellitus-we will start supplemental sliding scale coverage and prandial insulin and likely will need to add basal coverage as well.  Hold home oral diabetes medications while in the hospital. 9. Chronic diabetic peripheral polyneuropathy-resume home medications. 10. IBS-stable at this time.  Follow clinically. 11. History of AV block status post pacemaker placement-stable 12. OAB - stable on home medications which will be continued in hospital.  13. Hyperlipidemia - resume atorvastatin and fenofibrate.    DVT Prophylaxis: Eliquis Code Status: DNR Family Communication: None present during  evaluation Disposition Plan: To be determined  Time spent: 57 minutes  Irwin Brakeman, MD [facility] Pager [phone]  If 7PM-7AM, please contact night-coverage [email or web address] Password [email or web address] 09/26/2017, 2:55 PM

## 2017-09-26 NOTE — ED Provider Notes (Signed)
Emergency Department Provider Note   I have reviewed the triage vital signs and the nursing notes.   HISTORY  Chief Complaint Shortness of Breath   HPI Brandi Hunter is a 76 y.o. female history of COPD, obesity, AV block status post pacemaker, pink otitis and sleep apnea that wears oxygen as needed at night presents to the emergency department today with shortness of breath.  Patient initially went to her primary office where she is found to be hypoxic to 74% on room air with significantly decreased breath sounds and tachypnea so was transferred here for further evaluation.  She states that she has been having cough shortness of breath worse for the last couple days took some Benadryl at home but it seemed to get better so that is why she presented there.  On EMS's arrival to the doctor's office the patient had oxygen saturation 91% while getting a breathing treatment they also gave Solu-Medrol, 125 mg IV, prior to EMS arrival.  EMS kept her on oxygen and started a DuoNeb breathing treatment and an brought her here for further evaluation.  Patient states some improvement but still with significant tightness and wheezing.  No other associated modifying symptoms.  Does have a history of similar symptoms in the past.   Past Medical History:  Diagnosis Date  . Asthma   . Chronic anticoagulation   . COPD (chronic obstructive pulmonary disease) ([place])   . Diabetes mellitus without complication ([place])    TYPE 2  . DJD (degenerative joint disease)   . GERD (gastroesophageal reflux disease)   . History of home oxygen therapy    oxygen 2 liters per nasal cannula at hs  . HTN (hypertension)   . Hyperlipidemia   . Mobitz (type) II atrioventricular block   . Obesity   . Pacemaker    Implanted  by Dr [name] (MDT) 10/06/10  . PAF (paroxysmal atrial fibrillation) ([place])   . Pancreatitis 2010 OR 2011   pt thought this had been ruled out  . Sleep apnea    CPA SETTING OF 14    Patient Active  Problem List   Diagnosis Date Noted  . Acute respiratory distress 09/26/2017  . Acute exacerbation of chronic obstructive pulmonary disease (COPD) ([place]) 09/26/2017  . 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 Type 2 diabetes mellitus ([place]) 09/26/2017  . Chronic diastolic heart failure ([place]) 09/26/2017  . Paroxysmal atrial fibrillation ([place]) 05/24/2014  . Abnormality of gait 03/29/2013  . Diabetic neuropathy ([place]) 03/29/2013  . OSA (obstructive sleep apnea) 08/26/2012  . Chronic asthmatic bronchitis ([place]) 08/26/2012  . Preop cardiovascular exam 08/15/2012  . IBS (irritable bowel syndrome) 05/27/2011  . Mobitz type II atrioventricular block 02/09/2011  . HTN (hypertension) 02/09/2011  . Obesity 02/09/2011    Past Surgical History:  Procedure Laterality Date  . CYSTOCELE REPAIR    . EYE SURGERY  11/01/2012   BOTH EYES CATARACTS  . INSERT / REPLACE / REMOVE PACEMAKER  10/06/10   MDT  implanted by Dr [name]  . KNEE ARTHROSCOPY     both  . OVARY SURGERY     removal  . REPAIR RECTOCELE    . TONSILLECTOMY  AGE 75  . TOTAL ABDOMINAL HYSTERECTOMY  1971      Allergies Bee venom and Latex  Family History  Problem Relation Age of Onset  . Heart disease Mother        CHF  . Heart disease Father  CHF  . Arthritis Sister        osteo  . Cancer Brother        prostate  . Diabetes Brother   . Anesthesia problems Neg Hx   . Hypotension Neg Hx   . Malignant hyperthermia Neg Hx   . Pseudochol deficiency Neg Hx     Social History Social History   Tobacco Use  . Smoking status: Current Some Day Smoker    Packs/day: 0.10    Years: 50.00    Pack years: 5.00    Types: Cigarettes    Last attempt to quit: 07/17/2014    Years since quitting: 3.1  . Smokeless tobacco: Former Systems developer    Quit date: 11/17/2011  Substance Use Topics  . Alcohol use: Yes    Alcohol/week: 0.6 oz    Types: 1 Glasses of wine per week    Comment: couple glasses of wine occasionally-once a  month  . Drug use: No    Review of Systems  All other systems negative except as documented in the HPI. All pertinent positives and negatives as reviewed in the HPI. ____________________________________________   PHYSICAL EXAM:  VITAL SIGNS: Vitals:   09/26/17 1315 09/26/17 1400 09/26/17 1439 09/26/17 1521  BP:  (!) 143/55  (!) 126/52  Pulse: 61 64  70  Resp: 15 18  18   Temp:    98.1 F (36.7 C)  TempSrc:    Axillary  SpO2: 100% 94% 96% 100%  Weight:      Height:         Constitutional: Alert and oriented. Well appearing and in no acute distress. Eyes: Conjunctivae are normal. PERRL. EOMI. Head: Atraumatic. Nose: No congestion/rhinnorhea. Mouth/Throat: Mucous membranes are moist.  Oropharynx non-erythematous. Neck: No stridor.  No meningeal signs.   Cardiovascular: Normal rate, regular rhythm. Good peripheral circulation. Grossly normal heart sounds.   Respiratory: tachypneic respiratory effort.  No retractions. Lungs significantly diminished and mild wheezing. On [place] O2 currently. Gastrointestinal: Soft and nontender. No distention.  Musculoskeletal: No lower extremity tenderness nor edema. No gross deformities of extremities. Neurologic:  Normal speech and language. No gross focal neurologic deficits are appreciated.  Skin:  Skin is warm, dry and intact. No rash noted.  ____________________________________________   LABS (all labs ordered are listed, but only abnormal results are displayed)  Labs Reviewed  COMPREHENSIVE METABOLIC PANEL - Abnormal; Notable for the following components:      Result Value   Chloride 99 (*)    Glucose, Bld 185 (*)    All other components within normal limits  CBC WITH DIFFERENTIAL/PLATELET  TROPONIN I   ____________________________________________  EKG   EKG Interpretation  Date/Time:    Ventricular Rate:    PR Interval:    QRS Duration:   QT Interval:    QTC Calculation:   R Axis:     Text Interpretation:          ____________________________________________  RADIOLOGY  Dg Chest 2 View  Result Date: 09/26/2017 CLINICAL DATA:  Shortness of breath with cough and wheezing EXAM: CHEST  2 VIEW COMPARISON:  Chest radiograph August 26, 2012 and chest CT May 23, 2014 FINDINGS: There is no edema or consolidation. Heart is upper normal in size with pulmonary vascularity within normal limits. Pacemaker leads are attached to the right atrium and middle cardiac vein. No adenopathy. No evident bone lesions. IMPRESSION: No edema or consolidation. Stable cardiac silhouette. Pacemaker leads attached to right atrium and middle cardiac vein. Electronically Signed  By: Lowella Grip III M.D.   On: 09/26/2017 13:05    ____________________________________________   PROCEDURES  Procedure(s) performed:   Procedures  CRITICAL CARE Performed by: Merrily Pew Total critical care time: 35 minutes Critical care time was exclusive of separately billable procedures and treating other patients. Critical care was necessary to treat or prevent imminent or life-threatening deterioration. Critical care was time spent personally by me on the following activities: development of treatment plan with patient and/or surrogate as well as nursing, discussions with consultants, evaluation of patient's response to treatment, examination of patient, obtaining history from patient or surrogate, ordering and performing treatments and interventions, ordering and review of laboratory studies, ordering and review of radiographic studies, pulse oximetry and re-evaluation of patient's condition.  ____________________________________________   INITIAL IMPRESSION / ASSESSMENT AND PLAN / ED COURSE  Pertinent labs & imaging results that were available during my care of the patient were reviewed by me and considered in my medical decision making (see chart for details).  Suspect likely significant COPD exacerbation.  Already had 2  breathing treatments and is still diminished.  Will give a third.  We will start antibiotic secondary to the severity of the symptoms patient will likely need to be admitted.  Reevaluation patient has finished her third breathing treatment with health care but fourth in total.  Still sniffily diminished and wheezing has improved though.  Took her off of nasal cannula oxygen or saturations dropped to 87% pretty quickly just with conversation.  We will reapply oxygen and do a continuous albuterol.  Reaffirms decision to likely admit.  Patient is okay with that plan.   ____________________________________________  FINAL CLINICAL IMPRESSION(S) / ED DIAGNOSES  Final diagnoses:  COPD exacerbation ([place])  Hypoxia  Acute on chronic respiratory failure with hypoxia ([place])     MEDICATIONS GIVEN DURING THIS VISIT:  Medications  acetaminophen (TYLENOL) tablet 650 mg (not administered)    Or  acetaminophen (TYLENOL) suppository 650 mg (not administered)  traZODone (DESYREL) tablet 25 mg (not administered)  senna-docusate (Senokot-S) tablet 1 tablet (not administered)  ondansetron (ZOFRAN) tablet 4 mg (not administered)    Or  ondansetron (ZOFRAN) injection 4 mg (not administered)  methylPREDNISolone sodium succinate (SOLU-MEDROL) 40 mg/mL injection 40 mg (not administered)  nicotine (NICODERM CQ - dosed in mg/24 hours) patch 21 mg (not administered)  amLODipine (NORVASC) tablet 10 mg (not administered)  atorvastatin (LIPITOR) tablet 40 mg (not administered)  apixaban (ELIQUIS) tablet 5 mg (not administered)  fenofibrate tablet 160 mg (not administered)  furosemide (LASIX) tablet 40 mg (not administered)  LORazepam (ATIVAN) tablet 0.5 mg (not administered)  losartan (COZAAR) tablet 100 mg (not administered)  methocarbamol (ROBAXIN) tablet 500 mg (not administered)  metoprolol succinate (TOPROL-XL) 24 hr tablet 100 mg (not administered)  nitroGLYCERIN (NITROSTAT) SL tablet 0.4 mg (not  administered)  oxybutynin (DITROPAN-XL) 24 hr tablet 10 mg (not administered)  spironolactone (ALDACTONE) tablet 12.5 mg (not administered)  traMADol (ULTRAM) tablet 50 mg (not administered)  arformoterol (BROVANA) nebulizer solution 15 mcg (15 mcg Nebulization Not Given 09/26/17 1541)  budesonide (PULMICORT) nebulizer solution 0.25 mg (not administered)  insulin aspart (novoLOG) injection 0-20 Units (not administered)  doxycycline (VIBRAMYCIN) 100 mg in dextrose 5 % 250 mL IVPB (not administered)  insulin aspart (novoLOG) injection 5 Units (not administered)  chlorpheniramine-HYDROcodone (TUSSIONEX) 10-8 MG/5ML suspension 5 mL (not administered)  azithromycin (ZITHROMAX) 500 mg in dextrose 5 % 250 mL IVPB (500 mg Intravenous New Bag/Given 09/26/17 1356)     NEW OUTPATIENT  MEDICATIONS STARTED DURING THIS VISIT:  This SmartLink is deprecated. Use AVSMEDLIST instead to display the medication list for a patient.  Note:  This document was prepared using Dragon voice recognition software and may include unintentional dictation errors.  Merrily Pew, MD 09/26/17 1550

## 2017-09-27 ENCOUNTER — Inpatient Hospital Stay (HOSPITAL_COMMUNITY): Payer: Medicare Other

## 2017-09-27 ENCOUNTER — Other Ambulatory Visit: Payer: Self-pay

## 2017-09-27 LAB — COMPREHENSIVE METABOLIC PANEL
ALBUMIN: 3.5 g/dL (ref 3.5–5.0)
ALT: 22 U/L (ref 14–54)
AST: 22 U/L (ref 15–41)
Alkaline Phosphatase: 45 U/L (ref 38–126)
Anion gap: 11 (ref 5–15)
BILIRUBIN TOTAL: 0.6 mg/dL (ref 0.3–1.2)
BUN: 27 mg/dL — AB (ref 6–20)
CALCIUM: 8.9 mg/dL (ref 8.9–10.3)
CO2: 29 mmol/L (ref 22–32)
CREATININE: 1 mg/dL (ref 0.44–1.00)
Chloride: 95 mmol/L — ABNORMAL LOW (ref 101–111)
GFR calc Af Amer: >60 mL/min (ref >60)
GFR, EST NON AFRICAN AMERICAN: 54 mL/min — AB (ref >60)
Glucose, Bld: 288 mg/dL — ABNORMAL HIGH (ref 65–99)
Potassium: 3.9 mmol/L (ref 3.5–5.1)
SODIUM: 135 mmol/L (ref 135–145)
TOTAL PROTEIN: 6.4 g/dL — AB (ref 6.5–8.1)

## 2017-09-27 LAB — GLUCOSE, CAPILLARY
GLUCOSE-CAPILLARY: 327 mg/dL — AB (ref 65–99)
Glucose-Capillary: 199 mg/dL — ABNORMAL HIGH (ref 65–99)
Glucose-Capillary: 193 mg/dL — ABNORMAL HIGH (ref 65–99)
Glucose-Capillary: 166 mg/dL — ABNORMAL HIGH (ref 65–99)

## 2017-09-27 LAB — CBC
HCT: 39.3 % (ref 36.0–46.0)
Hemoglobin: 12.7 g/dL (ref 12.0–15.0)
MCH: 29 pg (ref 26.0–34.0)
MCHC: 32.3 g/dL (ref 30.0–36.0)
MCV: 89.7 fL (ref 78.0–100.0)
Platelets: 259 10*3/uL (ref 150–400)
RBC: 4.38 MIL/uL (ref 3.87–5.11)
RDW: 13.3 % (ref 11.5–15.5)
WBC: 10 10*3/uL (ref 4.0–10.5)

## 2017-09-27 LAB — MAGNESIUM: MAGNESIUM: 1.5 mg/dL — AB (ref 1.7–2.4)

## 2017-09-27 MED ORDER — METHYLPREDNISOLONE SODIUM SUCC 40 MG IJ SOLR
40.0000 mg | Freq: Four times a day (QID) | INTRAMUSCULAR | Status: DC
  Administered 2017-09-27 – 2017-09-28 (×4): 40 mg via INTRAVENOUS
  Filled 2017-09-27 (×4): qty 1

## 2017-09-27 MED ORDER — INSULIN ASPART 100 UNIT/ML ~~LOC~~ SOLN
8.0000 [IU] | Freq: Three times a day (TID) | SUBCUTANEOUS | Status: DC
  Administered 2017-09-27 – 2017-09-28 (×4): 8 [IU] via SUBCUTANEOUS

## 2017-09-27 MED ORDER — INSULIN GLARGINE 100 UNIT/ML ~~LOC~~ SOLN
25.0000 [IU] | SUBCUTANEOUS | Status: DC
  Administered 2017-09-27 – 2017-09-28 (×2): 25 [IU] via SUBCUTANEOUS
  Filled 2017-09-27 (×3): qty 0.25

## 2017-09-27 MED ORDER — DOXYCYCLINE HYCLATE 100 MG IV SOLR
INTRAVENOUS | Status: AC
  Filled 2017-09-27: qty 100

## 2017-09-27 MED ORDER — MAGNESIUM SULFATE 4 GM/100ML IV SOLN
4.0000 g | Freq: Once | INTRAVENOUS | Status: AC
  Administered 2017-09-27: 4 g via INTRAVENOUS
  Filled 2017-09-27: qty 100

## 2017-09-27 NOTE — Progress Notes (Signed)
Inpatient Diabetes Program Recommendations  AACE/ADA: New Consensus Statement on Inpatient Glycemic Control (2015)  Target Ranges:  Prepandial:   less than 140 mg/dL      Peak postprandial:   less than 180 mg/dL (1-2 hours)      Critically ill patients:  140 - 180 mg/dL   Results for Brandi Hunter, Brandi Hunter (MRN [id number]) as of 09/27/2017 08:01  Ref. Range 09/26/2017 16:34 09/26/2017 20:52 09/27/2017 07:27  Glucose-Capillary Latest Ref Range: 65 - 99 mg/dL 328 (H) 323 (H) 327 (H)    Admit with: SOB  History: DM, COPD  Home DM Meds: Metformin 1000 mg BID       Glipizide 10 mg BID  Current Insulin Orders: Novolog Resistant Correction Scale/ SSI (0-20 units) TID AC      Novolog 5 units TID       MD- Note patient getting Solumedrol 40 mg Q4 hours.  Novolog Correction scale and Novolog meal coverage started last PM at dinner.  Fasting CBG elevated this AM as well.  Please consider starting basal insulin for this patient during hospitalization.  Consider Lantus 20 units daily (0.2 units/kg dosing)     --Will follow patient during hospitalization--  Wyn Quaker RN, MSN, CDE Diabetes Coordinator Inpatient Glycemic Control Team Team Pager: [phone] (8a-5p)

## 2017-09-27 NOTE — Progress Notes (Signed)
Pt placed on [facility] CPAP. CPAP plugged into red outlet.2L O2 in line.  

## 2017-09-27 NOTE — Progress Notes (Signed)
PROGRESS NOTE   Brandi Hunter  WJX:[id number]  DOB: 12/23/1940  DOA: 09/26/2017 PCP: [name], FNP   Brief Admission Hx: Brandi Hunter is a 76 y.o. female longtime smoker with known oxygen dependent COPD presented to the emergency department with shortness of breath.  The patient had been seen earlier at an urgent care where she was found to be hypoxic with a pulse ox of 74% on room air with a significantly diminished breath sounds and tachypnea.  MDM/Assessment & Plan:   1. Acute exacerbation of chronic obstructive pulmonary disease -aggressive treatments. Clinically improving but not at baseline. Continue IV Solu-Medrol around-the-clock, scheduled nebulizer treatments, continue supplemental oxygen therapy. 2. Acute respiratory distress with hypoxia-continue supplemental oxygen therapy. Start ambulating today.  3. Essential Hypertension-resume home medications and follow closely. 4. Chronic active nicotine dependence-counseled at bedside on tobacco cessation and will provide a nicotine patch to use in the hospital. 5. COPD with oxygen dependence- acute exacerbation treatment as above.  6. Chronic diastolic heart failure-currently compensated, resume home medications. 7. OSA-will offer nightly CPAP treatment in the hospital. 8. Type 2 diabetes mellitus-now with steroid hyperglycemia - add lantus and increase prandial coverage. 9. Chronic diabetic peripheral polyneuropathy-resume home medications. 10. IBS-stable at this time.  Follow clinically. 11. History of AV block status post pacemaker placement-stable 12. OAB - stable on home medications which will be continued in hospital.  13. Hyperlipidemia - resume atorvastatin and fenofibrate.    DVT Prophylaxis: Eliquis Code Status: DNR Family Communication: None present during evaluation Disposition Plan: To be determined  Subjective: Pt says she has some improvement but still not back to her baseline. Some nonproductive cough.  Still requiring supplemental oxygen.   Objective: Vitals:   09/27/17 0656 09/27/17 0700 09/27/17 0805 09/27/17 0810  BP: (!) 147/65 140/90    Pulse: 65 64    Resp: 18 19    Temp: 97.9 F (36.6 C) 98.2 F (36.8 C)    TempSrc: Oral Oral    SpO2: 93% 93% (!) 86% (!) 86%  Weight:      Height:        Intake/Output Summary (Last 24 hours) at 09/27/2017 1052 Last data filed at 09/27/2017 0956 Gross per 24 hour  Intake 1000 ml  Output 1150 ml  Net -150 ml   Filed Weights   09/26/17 1212  Weight: 111.6 kg (246 lb)     REVIEW OF SYSTEMS  As per history otherwise all reviewed and reported negative  Exam:  General exam: awake, alert, NAD.  Respiratory system: diffuse exp wheeze heard mostly at bases.  Better air movement from prior exam.  No increased work of breathing. Cardiovascular system: S1 & S2 heard, RRR. No JVD, murmurs, gallops, clicks or pedal edema. Gastrointestinal system: Abdomen is nondistended, soft and nontender. Normal bowel sounds heard. Central nervous system: Alert and oriented. No focal neurological deficits. Extremities: no CCE.  Data Reviewed: Basic Metabolic Panel: Recent Labs  Lab 09/26/17 1233 09/27/17 0539  NA 139 135  K 3.7 3.9  CL 99* 95*  CO2 29 29  GLUCOSE 185* 288*  BUN 18 27*  CREATININE 0.70 1.00  CALCIUM 9.2 8.9  MG  --  1.5*   Liver Function Tests: Recent Labs  Lab 09/26/17 1233 09/27/17 0539  AST 27 22  ALT 26 22  ALKPHOS 52 45  BILITOT 0.8 0.6  PROT 7.2 6.4*  ALBUMIN 3.8 3.5   No results for input(s): LIPASE, AMYLASE in the last 168 hours. No results  for input(s): AMMONIA in the last 168 hours. CBC: Recent Labs  Lab 09/26/17 1233 09/27/17 0539  WBC 6.5 10.0  NEUTROABS 5.0  --   HGB 12.9 12.7  HCT 40.7 39.3  MCV 90.2 89.7  PLT 240 259   Cardiac Enzymes: Recent Labs  Lab 09/26/17 1233  TROPONINI <0.03   CBG (last 3)  Recent Labs    09/26/17 1634 09/26/17 2052 09/27/17 0727  GLUCAP 328* 323* 327*     No results found for this or any previous visit (from the past 240 hour(s)).   Studies: Dg Chest 2 View  Result Date: 09/26/2017 CLINICAL DATA:  Shortness of breath with cough and wheezing EXAM: CHEST  2 VIEW COMPARISON:  Chest radiograph August 26, 2012 and chest CT May 23, 2014 FINDINGS: There is no edema or consolidation. Heart is upper normal in size with pulmonary vascularity within normal limits. Pacemaker leads are attached to the right atrium and middle cardiac vein. No adenopathy. No evident bone lesions. IMPRESSION: No edema or consolidation. Stable cardiac silhouette. Pacemaker leads attached to right atrium and middle cardiac vein. Electronically Signed   By: Lowella Grip III M.D.   On: 09/26/2017 13:05   Portable Chest 1 View  Result Date: 09/27/2017 CLINICAL DATA:  Respiratory distress EXAM: PORTABLE CHEST 1 VIEW COMPARISON:  September 26, 2017 FINDINGS: Pacemaker leads are attached to the right atrium and right ventricle. No pneumothorax. Heart is borderline prominent with pulmonary vascularity within normal limits. No edema or consolidation. No adenopathy. No bone lesions. IMPRESSION: No edema or consolidation.  Stable cardiac silhouette. Electronically Signed   By: Lowella Grip III M.D.   On: 09/27/2017 07:05     Scheduled Meds: . amLODipine  10 mg Oral Daily  . apixaban  5 mg Oral BID  . arformoterol  15 mcg Nebulization BID  . atorvastatin  40 mg Oral Daily  . budesonide (PULMICORT) nebulizer solution  0.25 mg Nebulization BID  . fenofibrate  160 mg Oral QPM  . furosemide  40 mg Oral Daily  . insulin aspart  0-20 Units Subcutaneous TID WC  . insulin aspart  8 Units Subcutaneous TID WC  . insulin glargine  25 Units Subcutaneous BH-[id number]  . losartan  100 mg Oral QPM  . methylPREDNISolone (SOLU-MEDROL) injection  40 mg Intravenous Q4H  . metoprolol succinate  100 mg Oral Daily  . nicotine  21 mg Transdermal Daily  . oxybutynin  10 mg Oral Daily  .  spironolactone  12.5 mg Oral Daily   Continuous Infusions: . doxycycline (VIBRAMYCIN) IV Stopped (09/27/17 0700)  . magnesium sulfate 1 - 4 g bolus IVPB 4 g (09/27/17 1020)    Principal Problem:   Acute exacerbation of chronic obstructive pulmonary disease (COPD) (HCC) Active Problems:   Acute respiratory distress   HTN (hypertension)   IBS (irritable bowel syndrome)   OSA (obstructive sleep apnea)   Chronic asthmatic bronchitis (HCC)   Diabetic neuropathy (HCC)   Paroxysmal atrial fibrillation (HCC)   Dependence on nocturnal oxygen therapy   Type 2 diabetes mellitus ([place])   Chronic diastolic heart failure ([place])   Time spent:   Irwin Brakeman, MD, FAAFP [facility] Pager [phone]  If 7PM-7AM, please contact night-coverage [email or web address] Password [email or web address] 09/27/2017, 10:52 AM    LOS: 1 day

## 2017-09-28 LAB — GLUCOSE, CAPILLARY
GLUCOSE-CAPILLARY: 215 mg/dL — AB (ref 65–99)
GLUCOSE-CAPILLARY: 261 mg/dL — AB (ref 65–99)

## 2017-09-28 MED ORDER — ALBUTEROL SULFATE (2.5 MG/3ML) 0.083% IN NEBU
2.5000 mg | INHALATION_SOLUTION | RESPIRATORY_TRACT | Status: DC | PRN
  Administered 2017-09-28: 2.5 mg via RESPIRATORY_TRACT

## 2017-09-28 MED ORDER — DOXYCYCLINE HYCLATE 100 MG PO CAPS: 100.0000 mg | ORAL_CAPSULE | Freq: Two times a day (BID) | ORAL | 0 refills | Status: AC

## 2017-09-28 MED ORDER — PREDNISONE 20 MG PO TABS: ORAL_TABLET | ORAL | 0 refills | Status: DC

## 2017-09-28 MED ORDER — ALBUTEROL SULFATE (2.5 MG/3ML) 0.083% IN NEBU
INHALATION_SOLUTION | RESPIRATORY_TRACT | Status: AC
  Administered 2017-09-28: 2.5 mg via RESPIRATORY_TRACT
  Filled 2017-09-28: qty 3

## 2017-09-28 NOTE — Discharge Summary (Signed)
Physician Discharge Summary  Brayla Pat Sigley WRU:[id number] DOB: [birth year] DOA: 09/26/2017  PCP: [name], FNP Cardiologist: Truitt Merle   Admit date: 09/26/2017 Discharge date: 09/28/2017  Admitted From: Home  Disposition: Home   Recommendations for Outpatient Follow-up:  1. Follow up with PCP in 1 week 2. Follow up with cardiology in 2 weeks 3. Please obtain BMP/CBC in one week 4. Please repeat chest xray in 1-2 weeks 5. Resume home CPAP and supplemental oxygen.   Discharge Condition: STABLE   CODE STATUS: FULL    Brief Hospitalization Summary: Please see all hospital notes, images, labs for full details of the hospitalization.  HPI: Brandi Hunter is a 76 y.o. female longtime smoker with known oxygen dependent COPD presented to the emergency department with shortness of breath.  The patient had been seen earlier at an urgent care where she was found to be hypoxic with a pulse ox of 74% on room air with a significantly diminished breath sounds and tachypnea.  She was transferred to the ED for further evaluation.  The patient reports that for the past couple of days she has had progressive shortness of breath and cough.  She normally uses nocturnal oxygen but has been using it more frequently recently.  Patient reports cough.  The patient denies fever and chills.  EMS transported patient to ED.  They provided her with supplemental oxygen and Solu-Medrol.  The patient was given breathing treatments in the emergency department but continued to have diffuse wheezing shortness of breath and hypoxia.  Hospitalization was requested for further treatment.  Brief Admission Hx: Brandi Hohman Hossis a 76 y.o.femalelongtime smoker with known oxygen dependent COPDpresented to the emergency department with shortness of breath. The patient had been seen earlier at an urgent care where she was found to be hypoxic with a pulse ox of 74% on room air with a significantly diminished breath sounds and  tachypnea.  MDM/Assessment & Plan:   1. Acute exacerbation of chronic obstructive pulmonary disease-aggressive treatments needed but now much improved. I do worry that if she does not stop smoking she is in for a lot of suffering. Clinically she has improved greatly and stable for discharge home. Will discharge on prednisone taper, oral doxycycline and resume home supplemental  Oxygen, CPAP and nebulizers.  Follow up with PCP in 1 week and have chest xray repeated.  2. Acute respiratory distress with hypoxia-resolved now. continue supplemental oxygen therapy.  3. EssentialHypertension-resume home medications and follow closely. 4. Chronic active nicotine dependence-counseled at bedside on tobacco cessation and provided a nicotine patch to use in the hospital.  Pt strongly advised not to smoke with oxygen tank on.  5. COPD with oxygen dependence- acute exacerbation treatment as above.  6. Chronic diastolic heart failure-currently compensated, resume home medications. Follow up with cardiologist.  7. OSA-resume home CPAP.  8. Type 2 diabetes mellitus-hyperglycemia improved now that steroids being tapered down.  Monitor closely at home and notify PCP if more than 1 BS is >300.  Pt verbalized understanding to these instructions.  9. Chronic diabetic peripheral polyneuropathy-resume home medications. 10. IBS-stable at this time. Follow clinically. 11. History of AV block status post pacemaker placement-stable 12. OAB - stable on home medications which will be continued in hospital.  13. Hyperlipidemia - resume atorvastatin and fenofibrate.  DVT Prophylaxis:Eliquis Code Status:DNR Family Communication:None present during evaluation Disposition Plan:Home   Discharge Diagnoses:  Principal Problem:   Acute exacerbation of chronic obstructive pulmonary disease (COPD) ([place]) Active Problems:  Acute respiratory distress   HTN (hypertension)   IBS (irritable bowel syndrome)   OSA  (obstructive sleep apnea)   Chronic asthmatic bronchitis (HCC)   Diabetic neuropathy (HCC)   Paroxysmal atrial fibrillation (HCC)   Dependence on nocturnal oxygen therapy   Type 2 diabetes mellitus (HCC)   Chronic diastolic heart failure (HCC)  Discharge Instructions: Discharge Instructions    Call MD for:  difficulty breathing, headache or visual disturbances   Complete by:  As directed    Call MD for:  extreme fatigue   Complete by:  As directed    Call MD for:  persistant dizziness or light-headedness   Complete by:  As directed    Call MD for:  persistant nausea and vomiting   Complete by:  As directed    Call MD for:  severe uncontrolled pain   Complete by:  As directed    Diet - low sodium heart healthy   Complete by:  As directed    Increase activity slowly   Complete by:  As directed      Allergies as of 09/28/2017      Reactions   Bee Venom Swelling   Latex Swelling   LATEX CATHETERS      Medication List    TAKE these medications   ALIGN PO Take 1 capsule by mouth daily.   amLODipine 10 MG tablet Commonly known as:  NORVASC TAKE ONE TABLET BY MOUTH ONCE DAILY   atorvastatin 40 MG tablet Commonly known as:  LIPITOR Take 40 mg by mouth daily.   budesonide-formoterol 160-4.5 MCG/ACT inhaler Commonly known as:  SYMBICORT Inhale 2 puffs into the lungs 2 (two) times daily.   doxycycline 100 MG capsule Commonly known as:  VIBRAMYCIN Take 1 capsule (100 mg total) 2 (two) times daily for 5 days by mouth.   ELIQUIS 5 MG Tabs tablet Generic drug:  apixaban TAKE ONE TABLET BY MOUTH TWICE DAILY   fenofibrate 160 MG tablet Take 160 mg by mouth every evening.   furosemide 20 MG tablet Commonly known as:  LASIX Take 40 mg by mouth daily.   glipiZIDE 10 MG 24 hr tablet Commonly known as:  GLUCOTROL XL Take 10 mg by mouth 2 (two) times daily.   ipratropium-albuterol 0.5-2.5 (3) MG/3ML Soln Commonly known as:  DUONEB Take 3 mLs by nebulization 2 (two)  times daily.   LORazepam 0.5 MG tablet Commonly known as:  ATIVAN Take 1 tablet by mouth every 6 (six) hours as needed for anxiety.   losartan 100 MG tablet Commonly known as:  COZAAR Take 100 mg by mouth every evening.   metFORMIN 1000 MG tablet Commonly known as:  GLUCOPHAGE Take 1,000 mg by mouth 2 (two) times daily with a meal.   methocarbamol 500 MG tablet Commonly known as:  ROBAXIN Take 1 tablet by mouth every 6 (six) hours as needed for pain.   metoprolol succinate 100 MG 24 hr tablet Commonly known as:  TOPROL-XL TAKE ONE TABLET BY MOUTH ONCE DAILY   nitroGLYCERIN 0.4 MG SL tablet Commonly known as:  NITROSTAT Place 1 tablet (0.4 mg total) under the tongue every 5 (five) minutes as needed for chest pain.   oxybutynin 10 MG 24 hr tablet Commonly known as:  DITROPAN-XL Take 10 mg by mouth daily.   predniSONE 20 MG tablet Commonly known as:  DELTASONE Take 3 PO QAM x3days, 2 PO QAM x5days, 1 PO QAM x5days Start taking on:  09/29/2017   PROAIR HFA 108 (  90 Base) MCG/ACT inhaler Generic drug:  albuterol Inhale 2 puffs into the lungs every 4 (four) hours as needed for wheezing. Inhale 2 puffs into the lungs every 4 (four) hours as needed.   spironolactone 25 MG tablet Commonly known as:  ALDACTONE Take 12.5 mg by mouth daily.   traMADol 50 MG tablet Commonly known as:  ULTRAM Take 1 tablet by mouth every 6 (six) hours as needed for pain.      Follow-up Information    [name], [place]. Schedule an appointment as soon as possible for a visit in 1 week(s).   Specialty:  Nurse Practitioner Contact information: [address] [place] [address] [phone]        [name], NP. Schedule an appointment as soon as possible for a visit in 1 week(s).   Specialties:  Nurse Practitioner, Interventional Cardiology, Cardiology, Radiology Contact information: [address] [place] [address] [phone]           Allergies  Allergen Reactions  . Bee Venom Swelling  . Latex Swelling    LATEX CATHETERS   Current Discharge Medication List    START taking these medications   Details  doxycycline (VIBRAMYCIN) 100 MG capsule Take 1 capsule (100 mg total) 2 (two) times daily for 5 days by mouth. Qty: 10 capsule, Refills: 0    predniSONE (DELTASONE) 20 MG tablet Take 3 PO QAM x3days, 2 PO QAM x5days, 1 PO QAM x5days Qty: 24 tablet, Refills: 0      CONTINUE these medications which have NOT CHANGED   Details  albuterol (PROAIR HFA) 108 (90 BASE) MCG/ACT inhaler Inhale 2 puffs into the lungs every 4 (four) hours as needed for wheezing. Inhale 2 puffs into the lungs every 4 (four) hours as needed.    amLODipine (NORVASC) 10 MG tablet TAKE ONE TABLET BY MOUTH ONCE DAILY Qty: 90 tablet, Refills: 1    atorvastatin (LIPITOR) 40 MG tablet Take 40 mg by mouth daily.     budesonide-formoterol (SYMBICORT) 160-4.5 MCG/ACT inhaler Inhale 2 puffs into the lungs 2 (two) times daily.    ELIQUIS 5 MG TABS tablet TAKE ONE TABLET BY MOUTH TWICE DAILY Qty: 60 tablet, Refills: 5    fenofibrate 160 MG tablet Take 160 mg by mouth every evening.    furosemide (LASIX) 20 MG tablet Take 40 mg by mouth daily.     glipiZIDE (GLUCOTROL XL) 10 MG 24 hr tablet Take 10 mg by mouth 2 (two) times daily.     ipratropium-albuterol (DUONEB) 0.5-2.5 (3) MG/3ML SOLN Take 3 mLs by nebulization 2 (two) times daily.    LORazepam (ATIVAN) 0.5 MG tablet Take 1 tablet by mouth every 6 (six) hours as needed for anxiety.     losartan (COZAAR) 100 MG tablet Take 100 mg by mouth every evening.    metFORMIN (GLUCOPHAGE) 1000 MG tablet Take 1,000 mg by mouth 2 (two) times daily with a meal.    methocarbamol (ROBAXIN) 500 MG tablet Take 1 tablet by mouth every 6 (six) hours as needed for pain.    metoprolol succinate (TOPROL-XL) 100 MG 24 hr tablet TAKE ONE TABLET BY MOUTH ONCE DAILY Qty: 90 tablet, Refills: 1     nitroGLYCERIN (NITROSTAT) 0.4 MG SL tablet Place 1 tablet (0.4 mg total) under the tongue every 5 (five) minutes as needed for chest pain. Qty: 25 tablet, Refills: 3    oxybutynin (DITROPAN-XL) 10 MG 24 hr tablet Take 10 mg by  mouth daily.     Probiotic Product (ALIGN PO) Take 1 capsule by mouth daily.     spironolactone (ALDACTONE) 25 MG tablet Take 12.5 mg by mouth daily.    traMADol (ULTRAM) 50 MG tablet Take 1 tablet by mouth every 6 (six) hours as needed for pain.        Procedures/Studies: Dg Chest 2 View  Result Date: 09/26/2017 CLINICAL DATA:  Shortness of breath with cough and wheezing EXAM: CHEST  2 VIEW COMPARISON:  Chest radiograph August 26, 2012 and chest CT May 23, 2014 FINDINGS: There is no edema or consolidation. Heart is upper normal in size with pulmonary vascularity within normal limits. Pacemaker leads are attached to the right atrium and middle cardiac vein. No adenopathy. No evident bone lesions. IMPRESSION: No edema or consolidation. Stable cardiac silhouette. Pacemaker leads attached to right atrium and middle cardiac vein. Electronically Signed   By: Lowella Grip III M.D.   On: 09/26/2017 13:05   Portable Chest 1 View  Result Date: 09/27/2017 CLINICAL DATA:  Respiratory distress EXAM: PORTABLE CHEST 1 VIEW COMPARISON:  September 26, 2017 FINDINGS: Pacemaker leads are attached to the right atrium and right ventricle. No pneumothorax. Heart is borderline prominent with pulmonary vascularity within normal limits. No edema or consolidation. No adenopathy. No bone lesions. IMPRESSION: No edema or consolidation.  Stable cardiac silhouette. Electronically Signed   By: Lowella Grip III M.D.   On: 09/27/2017 07:05      Subjective: Pt says that she feels back to her baseline and was able to go to bathroom without getting SOB. Wheezing and coughing much better.   Discharge Exam: Vitals:   09/28/17 0751 09/28/17 0754  BP:    Pulse:    Resp:    Temp:     SpO2: 97% 97%   Vitals:   09/28/17 0555 09/28/17 0642 09/28/17 0751 09/28/17 0754  BP: 135/89     Pulse: 70     Resp: 20     Temp: 98.9 F (37.2 C)     TempSrc: Oral     SpO2: 93%  97% 97%  Weight:  111.8 kg (246 lb 8 oz)    Height:       General: Pt is alert, awake, not in acute distress Cardiovascular: RRR, S1/S2 +, no rubs, no gallops Respiratory: BBS good air movement, occasional exp wheezing heard, no rhonchi Abdominal: Soft, NT, ND, bowel sounds + Extremities: no edema, no cyanosis   The results of significant diagnostics from this hospitalization (including imaging, microbiology, ancillary and laboratory) are listed below for reference.     Microbiology: No results found for this or any previous visit (from the past 240 hour(s)).   Labs: BNP (last 3 results) No results for input(s): BNP in the last 8760 hours. Basic Metabolic Panel: Recent Labs  Lab 09/26/17 1233 09/27/17 0539  NA 139 135  K 3.7 3.9  CL 99* 95*  CO2 29 29  GLUCOSE 185* 288*  BUN 18 27*  CREATININE 0.70 1.00  CALCIUM 9.2 8.9  MG  --  1.5*   Liver Function Tests: Recent Labs  Lab 09/26/17 1233 09/27/17 0539  AST 27 22  ALT 26 22  ALKPHOS 52 45  BILITOT 0.8 0.6  PROT 7.2 6.4*  ALBUMIN 3.8 3.5   No results for input(s): LIPASE, AMYLASE in the last 168 hours. No results for input(s): AMMONIA in the last 168 hours. CBC: Recent Labs  Lab 09/26/17 1233 09/27/17 0539  WBC 6.5 10.0  NEUTROABS 5.0  --   HGB 12.9 12.7  HCT 40.7 39.3  MCV 90.2 89.7  PLT 240 259   Cardiac Enzymes: Recent Labs  Lab 09/26/17 1233  TROPONINI <0.03   BNP: Invalid input(s): POCBNP CBG: Recent Labs  Lab 09/27/17 0727 09/27/17 1123 09/27/17 1646 09/27/17 2139 09/28/17 0755  GLUCAP 327* 199* 193* 166* 215*   D-Dimer No results for input(s): DDIMER in the last 72 hours. Hgb A1c No results for input(s): HGBA1C in the last 72 hours. Lipid Profile No results for input(s): CHOL, HDL, LDLCALC,  TRIG, CHOLHDL, LDLDIRECT in the last 72 hours. Thyroid function studies No results for input(s): TSH, T4TOTAL, T3FREE, THYROIDAB in the last 72 hours.  Invalid input(s): FREET3 Anemia work up No results for input(s): VITAMINB12, FOLATE, FERRITIN, TIBC, IRON, RETICCTPCT in the last 72 hours. Urinalysis    Component Value Date/Time   COLORURINE AMBER (A) 05/11/2011 2020   APPEARANCEUR TURBID (A) 05/11/2011 2020   LABSPEC 1.028 05/11/2011 2020   PHURINE 5.0 05/11/2011 2020   GLUCOSEU 100 (A) 05/11/2011 2020   HGBUR NEGATIVE 05/11/2011 2020   BILIRUBINUR LARGE (A) 05/11/2011 2020   KETONESUR 15 (A) 05/11/2011 2020   PROTEINUR >300 (A) 05/11/2011 2020   UROBILINOGEN 1.0 05/11/2011 2020   NITRITE POSITIVE (A) 05/11/2011 2020   LEUKOCYTESUR SMALL (A) 05/11/2011 2020   Sepsis Labs Invalid input(s): PROCALCITONIN,  WBC,  LACTICIDVEN Microbiology No results found for this or any previous visit (from the past 240 hour(s)).  Time coordinating discharge: 31 mins  SIGNED:  Irwin Brakeman, MD  [facility] 09/28/2017, 9:37 AM Pager [phone]  If 7PM-7AM, please contact night-coverage [email or web address] Password [email or web address]

## 2017-09-28 NOTE — Discharge Instructions (Signed)
Follow with Primary MD  [name], FNP  and other consultant's as instructed your Hospitalist MD  Please get a complete blood count and chemistry panel checked by your Primary MD at your next visit, and again as instructed by your Primary MD.  Get Medicines reviewed and adjusted: Please take all your medications with you for your next visit with your Primary MD  Laboratory/radiological data: Please request your Primary MD to go over all hospital tests and procedure/radiological results at the follow up, please ask your Primary MD to get all Hospital records sent to his/her office.  In some cases, they will be blood work, cultures and biopsy results pending at the time of your discharge. Please request that your primary care M.D. follows up on these results.  Also Note the following: If you experience worsening of your admission symptoms, develop shortness of breath, life threatening emergency, suicidal or homicidal thoughts you must seek medical attention immediately by calling 911 or calling your MD immediately  if symptoms less severe.  You must read complete instructions/literature along with all the possible adverse reactions/side effects for all the Medicines you take and that have been prescribed to you. Take any new Medicines after you have completely understood and accpet all the possible adverse reactions/side effects.   Do not drive when taking Pain medications or sleeping medications (Benzodaizepines)  Do not take more than prescribed Pain, Sleep and Anxiety Medications. It is not advisable to combine anxiety,sleep and pain medications without talking with your primary care practitioner  Special Instructions: If you have smoked or chewed Tobacco  in the last 2 yrs please stop smoking, stop any regular Alcohol  and or any Recreational drug use.  Wear Seat belts while driving.  Please note: You were cared for by a hospitalist during your hospital stay. Once you are  discharged, your primary care physician will handle any further medical issues. Please note that NO REFILLS for any discharge medications will be authorized once you are discharged, as it is imperative that you return to your primary care physician (or establish a relationship with a primary care physician if you do not have one) for your post hospital discharge needs so that they can reassess your need for medications and monitor your lab values.

## 2017-09-28 NOTE — Care Management Note (Addendum)
Case Management Note  Patient Details  Name: Brandi Hunter MRN: [id number] Date of Birth: [birth year]  Subjective/Objective:                Admitted with COPD exacerbation. Pt from home, has HS home oxygen and neb. Will need cont oxygen. O2 through [facility].     Action/Plan: Referral faxed and [facility] and CM called. Port tank will be delivered to pt room prior to DC.   Expected Discharge Date:  09/28/17               Expected Discharge Plan:  Home/Self Care  In-House Referral:  NA  Discharge planning Services  CM Consult  Post Acute Care Choice:  Durable Medical Equipment Choice offered to:  Patient  DME Arranged:  Oxygen DME Agency:  [facility]  Status of Service:  Completed, signed off   Addendum: CM contacted by Brandi Hunter, [facility] rep, pt already set up with cont o2. They will bring port tank for transport home.   Sherald Barge, RN 09/28/2017, 12:16 PM

## 2017-09-28 NOTE — Progress Notes (Signed)
SATURATION QUALIFICATIONS: (This note is used to comply with regulatory documentation for home oxygen)  Patient Saturations on Room Air at Rest = 88%  Patient Saturations on Room Air while Ambulating = 88%  Patient Saturations on 2 Liters of oxygen while Ambulating = 95%  Please briefly explain why patient needs home oxygen:  Pt. desat's upon ambulating and rest with no oxygen on.

## 2017-09-28 NOTE — Progress Notes (Signed)
Pt IV removed. WNL. Home O2 delivered. D/C instructions give to pt. Verbalized understanding. Pt son at bedside to transport home.

## 2017-09-28 NOTE — Care Management (Signed)
Patient Information   Patient Name Brandi Hunter, Brandi Hunter ([id number]) Sex Female DOB 1941-08-22  Room Bed  A324 A324-01  Patient Demographics   Address [place] [address] Phone [phone] (Home) *Preferred* [phone] (Work) [phone] (Mobile) E-mail Address [email or web address]  Patient Ethnicity & Race   Ethnic Group Patient Race  Not Hispanic or Latino White or Caucasian  Emergency Contact(s)   Name Relation Home Work Mobile  Plummer,Wally Son [phone]    Holt,Teresa Other [phone]    Documents on File    Status Date Received Description  Documents for the Patient  EMR Patient Summary Not Received    [place] Received 05/11/11   [place] E-Signature HIPAA Notice of Privacy Received 08/12/12   [place] E-Signature HIPAA Notice of Privacy Spanish Not Received    Driver's License Not Received    Advance Directives/Living Will/HCPOA/POA Not Received    Driver's License Received 30/16/01 Exp. 01/2013  EMR Medication Summary Not Received    Insurance Card Not Received    Historic Radiology Documentation Not Received    Historic Radiology Documentation Not Received    Historic Radiology Documentation Not Received    Insurance Card Received 05/27/11 Medicare/AARP  AMB Correspondence Not Received  Card 01/12 [facility]  [place] HIPAA NOTICE OF PRIVACY - Scanned Not Received     HIPAA NOTICE OF PRIVACY - Scanned Received 05/27/11 [facility]  Financial Application Not Received    [employer] Not Received    Release of Information Not Received    [facility] Not Received    Insurance Card Not Received    Insurance Card Received 12/20/12   Release of Information Not Received    Driver's License Received 09/32/35 [facility] EXP 01/24/2018  Insurance Card Not Received    Insurance Card Received 02/17/13 [facility] MCR/AARP  Release of Information Received 02/17/13 [facility] MCR TOMO  Advanced Beneficiary Notice (ABN) Not  Received    Insurance Card Received 03/29/13 [employer]  Insurance Card Not Received    Driver's License Received 57/32/20   AMB Correspondence Not Received  05/14-07/14 [facility]  AMB Correspondence Not Received  referral Ouida Sills NP  Insurance Card Received 12/11/13 [facility]  HIM ROI Authorization  04/02/14 him roi authorization  Insurance Card Received 05/09/14 [address]  [place] OF PRIVACY - Scanned   [facility]  Insurance Card   [facility]  AMB Correspondence  05/28/14 PRIOR AUTH WAITING TO BE COMPLETED [facility]  AMB Correspondence  08/26/14 ORDER [facility]  AMB Intake Forms/Questionnaires  10/19/14   Other Photo ID Not Received    Insurance Card Received 03/06/15 [facility] INS  AMB HH/NH/Hospice  10/22/15 [facility]  HIM ROI Authorization  12/09/15 [facility]-Clearance Form  Release of Information Received 05/05/16 [facility]  AMB Correspondence  05/12/16 5/17-6/17 PROGRESS NOTE [place] NP, J  Insurance Card Received 05/12/17 MCR&AARP/2018  AMB Provider Completed Forms  07/06/16 MEDICAL CLEARANCE [facility]  AMB HH/NH/Hospice  10/01/15 [facility]  AMB HH/NH/Hospice  12/23/16 [facility]  AMB Provider Completed Forms  06/23/17 MEDICAL CLEARANCE [place] Card Received 07/22/17 bcg-2018  [place] - Scanned Received 07/22/17 bcg-2018  AMB Correspondence (Deleted) 04/28/13 05/14 Rehab [place]  Documents for the Encounter  AOB (Assignment of Insurance Benefits) Not Received    E-signature AOB Signed 09/26/17   MEDICARE RIGHTS Not Received    E-signature Medicare Rights Signed  09/26/17   EMS Run Sheet Received 09/26/17   ED Patient Billing Extract   ED PB Summary  EMS Run Sheet Received 09/26/17   Discharge Attachment   Acute Respiratory Distress Syndrome Adult (English)  Discharge Attachment   Acute Respiratory Failure Adult (English)  Discharge Attachment   Blood Glucose Monitoring  Adult (English)  Discharge Attachment   Diabetes Mellitus and Foot Care (English)  Discharge Attachment   Diabetes Mellitus and Sick Day Management (English)  Discharge Attachment   Steps to Quit Smoking Easy-to-Read (English)  Discharge Attachment   Tobacco Use Disorder (English)  Discharge Attachment   Health Risks of Smoking (English)  Discharge Attachment   Coping with Quitting Smoking (English)  After Visit Summary   IP After Visit Summary  Admission Information   Attending Provider Admitting Provider Admission Type Admission Date/Time  Murlean Iba, MD Murlean Iba, MD Emergency 09/26/17 1217  Discharge Date Hospital Service Auth/Cert Status Service Area   Internal Medicine Incomplete [place]  Unit Room/Bed Admission Status   AP-DEPT 300 A324/A324-01 Admission (Confirmed)   Admission   Complaint  [facility] Account   Name Acct ID Class Status Primary Coverage  Rikki, Trosper [id number] Inpatient Open MEDICARE - MEDICARE PART A AND B      Guarantor Account (for Hospital Account [id number])   Name Relation to Pt Service Area Active? Acct Type  Gillum, Jonnie Kind Self CHSA Yes Personal/Family  Address Phone    [place],  [address] [phone]) [phone](O)        Coverage Information (for Hospital Account [id number])   1. [place] PART A AND B   F/O Payor/Plan Precert #  MEDICARE/MEDICARE PART A AND B   Subscriber Subscriber #  Pollyanna, Levay [id number]  Address Phone  [address] [place], [place] [address]   2. AARP/AARP   F/O Payor/Plan Precert #  AARP/AARP   Subscriber Subscriber #  Kennetta, Pavlovic [id number]  Address Phone  [id number] [place] [address] [phone]

## 2017-09-28 NOTE — Care Management Important Message (Signed)
Important Message  Patient Details  Name: Brandi Hunter MRN: [id number] Date of Birth: 1941-09-13   Medicare Important Message Given:  Yes    Sherald Barge, RN 09/28/2017, 12:18 PM

## 2017-10-19 NOTE — Progress Notes (Signed)
CARDIOLOGY OFFICE NOTE  Date:  10/20/2017    Brandi Hunter Date of Birth: 08/08/1941 Medical Record #[id number]  PCP:  [name], [place]  Cardiologist:  Servando [name]    Chief Complaint  Patient presents with  . Leg Swelling    Post hospital visit - seen for Dr. [name]  . Shortness of Breath  . Hypertension  . Hyperlipidemia    History of Present Illness: Brandi Hunter is a 76 y.o. female who presents today for a 6 month check. Seen for Dr. [name].   She has had AV block with PPM dating back to 2011, HTN, diabetes, obesity, HLD, DJD, asthma, COPD with ongoing tobacco abuse, OSA and depression. Low risk Myoview in June of 2018. Normal EF per echo in June of 2015. She has had PAF - now on anticoagulation with Eliquis.   Last seen by me back in June of 2018 - was having some chest tightness - Myoview updated and this was felt to be low risk.   Seen by Dr. [name] in December of 2017 - felt to be stable. She has continued to smoke. Lipids followed by PCP.   Admitted last month with COPD exacerbation/hypoxia. Treated with antibiotics and steroid therapy. Strongly counseled in smoking cessation.   Comes in today. Here with her son. Says she is better but still with shortness of breath but not wheezing. Some swelling in feet and legs. This seems to be worse.  Less chest pain - says "just one time" since I last saw her - happened yesterday - actually used sl NTG - got relief.  Says this was a "different kind of pain" - the NTG gave prompt relief. Has not recurred.  Weight not really any higher. No real PND/orthopnea. Mostly just short of breath with activity. Using oxygen and CPAP now. She has stopped smoking - says this happened just a few days prior to the admission but admits "she wanted to eat one this past Saturday". To see Dr. Halford Chessman in February but does not have actual appointment made. Son asking about giving her one of his Zaroxolyn tablets due to the swelling (I strongly  said no). Does not take her Lasix if going to be out of the house.   Past Medical History:  Diagnosis Date  . Asthma   . Chronic anticoagulation   . COPD (chronic obstructive pulmonary disease) ([place])   . Diabetes mellitus without complication ([place])    TYPE 2  . DJD (degenerative joint disease)   . GERD (gastroesophageal reflux disease)   . History of home oxygen therapy    oxygen 2 liters per nasal cannula at hs  . HTN (hypertension)   . Hyperlipidemia   . Mobitz (type) II atrioventricular block   . Obesity   . Pacemaker    Implanted  by Dr [name] (MDT) 10/06/10  . PAF (paroxysmal atrial fibrillation) ([place])   . Pancreatitis 2010 OR 2011   pt thought this had been ruled out  . Sleep apnea    CPA SETTING OF 14    Past Surgical History:  Procedure Laterality Date  . CATARACT EXTRACTION W/PHACO  11/02/2011   Procedure: CATARACT EXTRACTION PHACO AND INTRAOCULAR LENS PLACEMENT (IOC);  Surgeon: Williams Che;  Location: [facility];  Service: Ophthalmology;  Laterality: Right;  CDE=7.33  . CATARACT EXTRACTION W/PHACO  12/07/2011   Procedure: CATARACT EXTRACTION PHACO AND INTRAOCULAR LENS PLACEMENT (IOC);  Surgeon: Williams Che, MD;  Location: [facility];  Service: Ophthalmology;  Laterality: Left;  CDE 3.61  . COLONOSCOPY WITH PROPOFOL N/A 08/09/2014   Procedure: COLONOSCOPY WITH PROPOFOL;  Surgeon: Juanita Craver, MD;  Location: [facility];  Service: Endoscopy;  Laterality: N/A;  . CYSTOCELE REPAIR    . ESOPHAGOGASTRODUODENOSCOPY (EGD) WITH PROPOFOL N/A 08/09/2014   Procedure: ESOPHAGOGASTRODUODENOSCOPY (EGD) WITH PROPOFOL;  Surgeon: Juanita Craver, MD;  [facility];  Service: Endoscopy;  Laterality: N/A;  . EYE SURGERY  11/01/2012   BOTH EYES CATARACTS  . INSERT / REPLACE / REMOVE PACEMAKER  10/06/10   MDT  implanted by Dr [name]  . KNEE ARTHROSCOPY     both  . OVARY SURGERY     removal  . REPAIR RECTOCELE    . SIMPLE MASTECTOMY WITH AXILLARY SENTINEL NODE BIOPSY Left  01/09/2015   Procedure: Irrigation and Drainage Abcess left Axilla;  Surgeon: Jackolyn Confer, MD;  Location: [facility];  Service: General;  Laterality: Left;  . TONSILLECTOMY  AGE 76  . TOTAL ABDOMINAL HYSTERECTOMY  1971     Medications: Current Meds  Medication Sig  . albuterol (PROAIR HFA) 108 (90 BASE) MCG/ACT inhaler Inhale 2 puffs into the lungs every 4 (four) hours as needed for wheezing. Inhale 2 puffs into the lungs every 4 (four) hours as needed.  Marland Kitchen amLODipine (NORVASC) 10 MG tablet TAKE ONE TABLET BY MOUTH ONCE DAILY  . atorvastatin (LIPITOR) 40 MG tablet Take 40 mg by mouth daily.   . budesonide-formoterol (SYMBICORT) 160-4.5 MCG/ACT inhaler Inhale 2 puffs into the lungs 2 (two) times daily.  Marland Kitchen ELIQUIS 5 MG TABS tablet TAKE ONE TABLET BY MOUTH TWICE DAILY  . fenofibrate 160 MG tablet Take 160 mg by mouth every evening.  . furosemide (LASIX) 20 MG tablet Take 40 mg by mouth daily.   Marland Kitchen glipiZIDE (GLUCOTROL XL) 10 MG 24 hr tablet Take 10 mg by mouth 2 (two) times daily.   Marland Kitchen ipratropium-albuterol (DUONEB) 0.5-2.5 (3) MG/3ML SOLN Take 3 mLs by nebulization 2 (two) times daily.  Marland Kitchen LORazepam (ATIVAN) 0.5 MG tablet Take 1 tablet by mouth every 6 (six) hours as needed for anxiety.   Marland Kitchen losartan (COZAAR) 100 MG tablet Take 100 mg by mouth every evening.  . metFORMIN (GLUCOPHAGE) 1000 MG tablet Take 1,000 mg by mouth 2 (two) times daily with a meal.  . methocarbamol (ROBAXIN) 500 MG tablet Take 1 tablet by mouth every 6 (six) hours as needed for pain.  . metoprolol succinate (TOPROL-XL) 100 MG 24 hr tablet TAKE ONE TABLET BY MOUTH ONCE DAILY  . nitroGLYCERIN (NITROSTAT) 0.4 MG SL tablet Place 1 tablet (0.4 mg total) under the tongue every 5 (five) minutes as needed for chest pain.  Marland Kitchen oxybutynin (DITROPAN-XL) 10 MG 24 hr tablet Take 10 mg by mouth daily.   . Probiotic Product (ALIGN PO) Take 1 capsule by mouth daily.   Marland Kitchen spironolactone (ALDACTONE) 25 MG tablet Take 12.5 mg by mouth daily.  .  traMADol (ULTRAM) 50 MG tablet Take 1 tablet by mouth every 6 (six) hours as needed for pain.     Allergies: Allergies  Allergen Reactions  . Bee Venom Swelling  . Latex Swelling    LATEX CATHETERS    Social History: The patient  reports that she has been smoking cigarettes.  She has a 5.00 pack-year smoking history. She quit smokeless tobacco use about 5 years ago. She reports that she drinks about 0.6 oz of alcohol per week. She reports that she does not use drugs.  Family History: The patient's family history includes Arthritis in her sister; Cancer in her brother; Diabetes in her brother; Heart disease in her father and mother.   Review of Systems: Please see the history of present illness.   Otherwise, the review of systems is positive for none.   All other systems are reviewed and negative.   Physical Exam: VS:  BP 132/80   Pulse 71   Ht 5\' 6"  (1.676 m)   Wt 244 lb (110.7 kg)   BMI 39.38 kg/m  .  BMI Body mass index is 39.38 kg/m.  Wt Readings from Last 3 Encounters:  10/20/17 244 lb (110.7 kg)  09/28/17 246 lb 8 oz (111.8 kg)  05/12/17 243 lb (110.2 kg)    General: Pleasant. Obese. Looks chronically ill but alert and in no acute distress.   HEENT: Normal.  Neck: Supple, no JVD, carotid bruits, or masses noted.  Cardiac: Regular rate and rhythm. No murmurs, rubs, or gallops. +1 edema.  Respiratory:  Lungs are coarse, few coarse wheezes in the bases but with overall normal work of breathing.  GI: Soft and nontender.  MS: No deformity or atrophy. Gait and ROM intact.  Skin: Warm and dry. Color is normal.  Neuro:  Strength and sensation are intact and no gross focal deficits noted.  Psych: Alert, appropriate and with normal affect.   LABORATORY DATA:  EKG:  EKG is ordered today. This demonstrates AV pacing  Lab Results  Component Value Date   WBC 10.0 09/27/2017   HGB 12.7 09/27/2017   HCT 39.3 09/27/2017   PLT 259 09/27/2017   GLUCOSE 288 (H) 09/27/2017     ALT 22 09/27/2017   AST 22 09/27/2017   NA 135 09/27/2017   K 3.9 09/27/2017   CL 95 (L) 09/27/2017   CREATININE 1.00 09/27/2017   BUN 27 (H) 09/27/2017   CO2 29 09/27/2017   TSH 2.430 05/03/2017   INR 1.13 01/08/2015     BNP (last 3 results) No results for input(s): BNP in the last 8760 hours.  ProBNP (last 3 results) No results for input(s): PROBNP in the last 8760 hours.   Other Studies Reviewed Today:  Myoview Study Highlights 04/2017   Nuclear stress EF: 65%.  Probable normal perfusion and soft tissue attenuation  This is a low risk study.   Echo Study Conclusions 2015  - Left ventricle: The cavity size was normal. Systolic function was normal. The estimated ejection fraction was in the range of 60% to 65%. Wall motion was normal; there were no regional wall motion abnormalities. Doppler parameters are consistent with abnormal left ventricular relaxation (grade 1 diastolic dysfunction). - Aortic valve: Trileaflet; normal thickness leaflets. There was no regurgitation. - Aortic root: The aortic root was normal in size. - Mitral valve: There was mild regurgitation. - Left atrium: The atrium was mildly dilated. - Right ventricle: Systolic function was normal. - Right atrium: The atrium was normal in size. - Pulmonic valve: There was no regurgitation. - Pulmonary arteries: Systolic pressure was within the normal range. - Pericardium, extracardiac: There was no pericardial effusion.  Impressions:  - Normal biventricular size and function. Impaired relaxation. Mild mitral regurgitation.  Assessment/Plan:  1. Recent admission for COPD exacerbation/hypoxia - improving - would favor seeing pulmonary. Will see if we can get her an appointment. She has stopped smoking.   2. Diastolic HF - some swelling on exam. Lasix being increased to 60 mg for 3 days and then back to 40 mg  daily. Updating echo. Salt restriction.   3. Chest pain - low risk  Myoview from back in the summer - if recurs/worsens/continues will need further evaluation.   4. Underlying PPM for Mobitz type 2 - seeing Dr. [name] later this month - has had noted atrial lead noise noted but not V dependent.   5. HTN - BP ok on current regimen - no changes made.   6. Obesity  7. PAF - looks to be in sinus today.   Current medicines are reviewed with the patient today.  The patient does not have concerns regarding medicines other than what has been noted above.  The following changes have been made:  See above.  Labs/ tests ordered today include:    Orders Placed This Encounter  Procedures  . Basic metabolic panel  . CBC  . Pro b natriuretic peptide (BNP)  . EKG 12-Lead  . ECHOCARDIOGRAM COMPLETE     Disposition:   FU with Dr. [name] later this month for her device check. I will see in 2 months to recheck as well.     Patient is agreeable to this plan and will call if any problems develop in the interim.   SignedTruitt Merle, NP  10/20/2017 10:01 AM  [facility] [address] [place], [place]  [address] Phone: [phone] Fax: [phone]

## 2017-10-20 ENCOUNTER — Ambulatory Visit (INDEPENDENT_AMBULATORY_CARE_PROVIDER_SITE_OTHER): Payer: Medicare Other | Admitting: Nurse Practitioner

## 2017-10-20 ENCOUNTER — Encounter: Payer: Self-pay | Admitting: Nurse Practitioner

## 2017-10-20 VITALS — BP 132/80 | HR 71 | Ht 66.0 in | Wt 244.0 lb

## 2017-10-20 DIAGNOSIS — I5032 Chronic diastolic (congestive) heart failure: Secondary | ICD-10-CM

## 2017-10-20 DIAGNOSIS — Z950 Presence of cardiac pacemaker: Secondary | ICD-10-CM

## 2017-10-20 DIAGNOSIS — I441 Atrioventricular block, second degree: Secondary | ICD-10-CM | POA: Diagnosis not present

## 2017-10-20 DIAGNOSIS — R609 Edema, unspecified: Secondary | ICD-10-CM | POA: Diagnosis not present

## 2017-10-20 DIAGNOSIS — R0602 Shortness of breath: Secondary | ICD-10-CM | POA: Diagnosis not present

## 2017-10-20 DIAGNOSIS — I480 Paroxysmal atrial fibrillation: Secondary | ICD-10-CM | POA: Diagnosis not present

## 2017-10-20 DIAGNOSIS — I10 Essential (primary) hypertension: Secondary | ICD-10-CM | POA: Diagnosis not present

## 2017-10-20 NOTE — Patient Instructions (Addendum)
We will be checking the following labs today - BMET, CBC, BNP   Medication Instructions:    Continue with your current medicines. BUT  Increase the Lasix to 60 mg for 3 days - then back to 40 mg a day    Testing/Procedures To Be Arranged:  Echocardiogram  Follow-Up:   See Dr. [name] as planned later this month  See me in 2 months  See if we can get her a post hospital visit with pulmonary - sees Dr. Halford Chessman - ?see Patricia Nettle, NP if possible??    Other Special Instructions:   Congrats for not smoking!    If you need a refill on your cardiac medications before your next appointment, please call your pharmacy.   Call the [place] office at [phone] if you have any questions, problems or concerns.

## 2017-10-21 ENCOUNTER — Other Ambulatory Visit: Payer: Self-pay | Admitting: Internal Medicine

## 2017-10-21 LAB — BASIC METABOLIC PANEL
BUN/Creatinine Ratio: 18 (ref 12–28)
BUN: 15 mg/dL (ref 8–27)
CO2: 29 mmol/L (ref 20–29)
Calcium: 9.2 mg/dL (ref 8.7–10.3)
Chloride: 97 mmol/L (ref 96–106)
Creatinine, Ser: 0.84 mg/dL (ref 0.57–1.00)
GFR calc Af Amer: 79 mL/min/{1.73_m2} (ref >59)
GFR calc non Af Amer: 69 mL/min/{1.73_m2} (ref >59)
Glucose: 179 mg/dL — ABNORMAL HIGH (ref 65–99)
Potassium: 4.1 mmol/L (ref 3.5–5.2)
Sodium: 140 mmol/L (ref 134–144)

## 2017-10-21 LAB — CBC
Hematocrit: 40 % (ref 34.0–46.6)
Hemoglobin: 13.3 g/dL (ref 11.1–15.9)
MCH: 29 pg (ref 26.6–33.0)
MCHC: 33.3 g/dL (ref 31.5–35.7)
MCV: 87 fL (ref 79–97)
Platelets: 229 10*3/uL (ref 150–379)
RBC: 4.59 x10E6/uL (ref 3.77–5.28)
RDW: 14.1 % (ref 12.3–15.4)
WBC: 6.2 10*3/uL (ref 3.4–10.8)

## 2017-10-21 LAB — PRO B NATRIURETIC PEPTIDE: NT-Pro BNP: 257 pg/mL (ref 0–301)

## 2017-10-22 ENCOUNTER — Encounter: Payer: Self-pay | Admitting: Adult Health

## 2017-10-22 ENCOUNTER — Other Ambulatory Visit: Payer: Self-pay

## 2017-10-22 ENCOUNTER — Ambulatory Visit (INDEPENDENT_AMBULATORY_CARE_PROVIDER_SITE_OTHER): Payer: Medicare Other | Admitting: Adult Health

## 2017-10-22 ENCOUNTER — Ambulatory Visit (HOSPITAL_COMMUNITY): Payer: Medicare Other | Attending: Cardiovascular Disease

## 2017-10-22 DIAGNOSIS — E119 Type 2 diabetes mellitus without complications: Secondary | ICD-10-CM | POA: Diagnosis not present

## 2017-10-22 DIAGNOSIS — Z6839 Body mass index (BMI) 39.0-39.9, adult: Secondary | ICD-10-CM | POA: Diagnosis not present

## 2017-10-22 DIAGNOSIS — I5032 Chronic diastolic (congestive) heart failure: Secondary | ICD-10-CM | POA: Diagnosis not present

## 2017-10-22 DIAGNOSIS — I110 Hypertensive heart disease with heart failure: Secondary | ICD-10-CM | POA: Diagnosis not present

## 2017-10-22 DIAGNOSIS — J449 Chronic obstructive pulmonary disease, unspecified: Secondary | ICD-10-CM | POA: Insufficient documentation

## 2017-10-22 DIAGNOSIS — J441 Chronic obstructive pulmonary disease with (acute) exacerbation: Secondary | ICD-10-CM | POA: Diagnosis not present

## 2017-10-22 DIAGNOSIS — G4733 Obstructive sleep apnea (adult) (pediatric): Secondary | ICD-10-CM

## 2017-10-22 DIAGNOSIS — Z87891 Personal history of nicotine dependence: Secondary | ICD-10-CM | POA: Insufficient documentation

## 2017-10-22 DIAGNOSIS — I4891 Unspecified atrial fibrillation: Secondary | ICD-10-CM | POA: Diagnosis not present

## 2017-10-22 NOTE — Patient Instructions (Signed)
Continue on Symbicort 2 puffs Twice daily  , rinse after use.  Continue on CPAP At bedtime   Work on healthy weight .  Do not drive if sleepy  Follow up with Dr. Halford Chessman  In 6 months and As needed   Please contact office for sooner follow up if symptoms do not improve or worsen or seek emergency care

## 2017-10-22 NOTE — Progress Notes (Signed)
@Patient  ID: Brandi Hunter, female    DOB: 10/21/1941, 76 y.o.   MRN: [id number]  Chief Complaint  Patient presents with  . Follow-up    Referring provider: [name], FNP  HPI: 75 yo female former smoker followed for COPD /Asthma , OSA/OHS   Sleep tests PSG 11/18/08 >> AHI 9 Auto CPAP 09/25/16 to 12/23/16 >> used on 89 of 90 nights with average 10 hrs 23 min.  Average AHI 1.2 with median CPAP 6 and 95 th percentile CPAP 11 cm H2O  Pulmonary tests PFT 09/27/12 >> FEV1 1.81 (79%), FEV1% 65, TLC 4.64 (86%), DLCO 77%, no BD CT chest 05/23/14 >> 5 mm nodule Rt lower lung stable since 2008  Cardiac tests Echo 05/01/14 >> EF 60 to 01%, grade 1 diastolic dysfx  Past medical history Mobitz II s/p PM, HTN, HLD, PAF, DJD, Pancreatitis, DM, GERD  10/22/2017 Follow up : COPD , OSA , post hospital  Patient returns for a follow-up.  She was admitted in the hospital last month for COPD exacerbation.  She required treatment with nebulized bronchodilators antibiotics and steroids. She says she is feeling better with decreased cough and shortness of breath.  Feels that she is getting back to her baseline.  She remains on Symbicort twice daily. Says that she is trying to quit smoking.  Encouraged on cessation..   Patient has sleep apnea and is on CPAP at bedtime.  Patient says she wears her CPAP each night.  She feels rested.  Denies any significant daytime sleepiness.  Download shows excellent compliance with average usage at 8.5 hours.  AHI 0.7.  Possible leaks.   Allergies  Allergen Reactions  . Bee Venom Swelling  . Latex Swelling    LATEX CATHETERS    Immunization History  Administered Date(s) Administered  . Influenza Split 08/27/2011, 09/14/2012, 09/19/2014, 08/30/2015  . Influenza, High Dose Seasonal PF 08/20/2016, 08/16/2017  . Pneumococcal Conjugate-13 11/16/2008  . Zoster 09/14/2012    Past Medical History:  Diagnosis Date  . Asthma   . Chronic anticoagulation   .  COPD (chronic obstructive pulmonary disease) ([place])   . Diabetes mellitus without complication ([place])    TYPE 2  . DJD (degenerative joint disease)   . GERD (gastroesophageal reflux disease)   . History of home oxygen therapy    oxygen 2 liters per nasal cannula at hs  . HTN (hypertension)   . Hyperlipidemia   . Mobitz (type) II atrioventricular block   . Obesity   . Pacemaker    Implanted  by Dr [name] (MDT) 10/06/10  . PAF (paroxysmal atrial fibrillation) ([place])   . Pancreatitis 2010 OR 2011   pt thought this had been ruled out  . Sleep apnea    CPA SETTING OF 14    Tobacco History: Social History   Tobacco Use  Smoking Status Former Smoker  . Packs/day: 1.00  . Years: 50.00  . Pack years: 50.00  . Types: Cigarettes  . Last attempt to quit: 09/22/2017  . Years since quitting: 0.0  Smokeless Tobacco Former Systems developer  . Quit date: 11/17/2011   Counseling given: Not Answered   Outpatient Encounter Medications as of 10/22/2017  Medication Sig  . albuterol (PROAIR HFA) 108 (90 BASE) MCG/ACT inhaler Inhale 2 puffs into the lungs every 4 (four) hours as needed for wheezing. Inhale 2 puffs into the lungs every 4 (four) hours as needed.  Marland Kitchen amLODipine (NORVASC) 10 MG tablet TAKE 1 TABLET BY MOUTH ONCE DAILY  .  atorvastatin (LIPITOR) 40 MG tablet Take 40 mg by mouth daily.   . budesonide-formoterol (SYMBICORT) 160-4.5 MCG/ACT inhaler Inhale 2 puffs into the lungs 2 (two) times daily.  Marland Kitchen ELIQUIS 5 MG TABS tablet TAKE ONE TABLET BY MOUTH TWICE DAILY  . fenofibrate 160 MG tablet Take 160 mg by mouth every evening.  . furosemide (LASIX) 20 MG tablet Take 40 mg by mouth daily.   Marland Kitchen glipiZIDE (GLUCOTROL XL) 10 MG 24 hr tablet Take 10 mg by mouth 2 (two) times daily.   Marland Kitchen ipratropium-albuterol (DUONEB) 0.5-2.5 (3) MG/3ML SOLN Take 3 mLs by nebulization 2 (two) times daily.  Marland Kitchen LORazepam (ATIVAN) 0.5 MG tablet Take 1 tablet by mouth every 6 (six) hours as needed for anxiety.   Marland Kitchen losartan (COZAAR)  100 MG tablet Take 100 mg by mouth every evening.  . metFORMIN (GLUCOPHAGE) 1000 MG tablet Take 1,000 mg by mouth 2 (two) times daily with a meal.  . methocarbamol (ROBAXIN) 500 MG tablet Take 1 tablet by mouth every 6 (six) hours as needed for pain.  . metoprolol succinate (TOPROL-XL) 100 MG 24 hr tablet TAKE 1 TABLET BY MOUTH ONCE DAILY  . oxybutynin (DITROPAN-XL) 10 MG 24 hr tablet Take 10 mg by mouth daily.   . Probiotic Product (ALIGN PO) Take 1 capsule by mouth daily.   Marland Kitchen spironolactone (ALDACTONE) 25 MG tablet Take 12.5 mg by mouth daily.  . traMADol (ULTRAM) 50 MG tablet Take 1 tablet by mouth every 6 (six) hours as needed for pain.  . nitroGLYCERIN (NITROSTAT) 0.4 MG SL tablet Place 1 tablet (0.4 mg total) under the tongue every 5 (five) minutes as needed for chest pain.   No facility-administered encounter medications on file as of 10/22/2017.      Review of Systems  Constitutional:   No  weight loss, night sweats,  Fevers, chills, + fatigue, or  lassitude.  HEENT:   No headaches,  Difficulty swallowing,  Tooth/dental problems, or  Sore throat,                No sneezing, itching, ear ache, nasal congestion, post nasal drip,   CV:  No chest pain,  Orthopnea, PND, swelling in lower extremities, anasarca, dizziness, palpitations, syncope.   GI  No heartburn, indigestion, abdominal pain, nausea, vomiting, diarrhea, change in bowel habits, loss of appetite, bloody stools.   Resp:  No coughing up of blood.  No change in color of mucus.  No wheezing.  No chest wall deformity  Skin: no rash or lesions.  GU: no dysuria, change in color of urine, no urgency or frequency.  No flank pain, no hematuria   MS:  No joint pain or swelling.  No decreased range of motion.  No back pain.    Physical Exam  BP 124/74 (BP Location: Right Arm, Cuff Size: Large)   Pulse 84   SpO2 94%   GEN: A/Ox3; pleasant , NAD, elderly    HEENT:  [place]/AT,  EACs-clear, TMs-wnl, NOSE-clear, THROAT-clear,  no lesions, no postnasal drip or exudate noted.   NECK:  Supple w/ fair ROM; no JVD; normal carotid impulses w/o bruits; no thyromegaly or nodules palpated; no lymphadenopathy.    RESP  Decreased BS in bases  no accessory muscle use, no dullness to percussion  CARD:  RRR, no m/r/g, no peripheral edema, pulses intact, no cyanosis or clubbing.  GI:   Soft & nt; nml bowel sounds; no organomegaly or masses detected.   Musco: Warm bil, no deformities  or joint swelling noted.   Neuro: alert, no focal deficits noted.    Skin: Warm, no lesions or rashes    Lab Results:  CBC   Imaging: Dg Chest 2 View  Result Date: 09/26/2017 CLINICAL DATA:  Shortness of breath with cough and wheezing EXAM: CHEST  2 VIEW COMPARISON:  Chest radiograph August 26, 2012 and chest CT May 23, 2014 FINDINGS: There is no edema or consolidation. Heart is upper normal in size with pulmonary vascularity within normal limits. Pacemaker leads are attached to the right atrium and middle cardiac vein. No adenopathy. No evident bone lesions. IMPRESSION: No edema or consolidation. Stable cardiac silhouette. Pacemaker leads attached to right atrium and middle cardiac vein. Electronically Signed   By: Lowella Grip III M.D.   On: 09/26/2017 13:05   Portable Chest 1 View  Result Date: 09/27/2017 CLINICAL DATA:  Respiratory distress EXAM: PORTABLE CHEST 1 VIEW COMPARISON:  September 26, 2017 FINDINGS: Pacemaker leads are attached to the right atrium and right ventricle. No pneumothorax. Heart is borderline prominent with pulmonary vascularity within normal limits. No edema or consolidation. No adenopathy. No bone lesions. IMPRESSION: No edema or consolidation.  Stable cardiac silhouette. Electronically Signed   By: Lowella Grip III M.D.   On: 09/27/2017 07:05     Assessment & Plan:   No problem-specific Assessment & Plan notes found for this encounter.     Rexene Edison, NP 10/22/2017

## 2017-10-22 NOTE — Assessment & Plan Note (Signed)
Doing well on CPAP   Plan  Cont on CPAP

## 2017-10-22 NOTE — Assessment & Plan Note (Signed)
Recent flare now resolving   Plan  Patient Instructions  Continue on Symbicort 2 puffs Twice daily  , rinse after use.  Continue on CPAP At bedtime   Work on healthy weight .  Do not drive if sleepy  Follow up with Dr. Halford Chessman  In 6 months and As needed   Please contact office for sooner follow up if symptoms do not improve or worsen or seek emergency care

## 2017-10-27 ENCOUNTER — Encounter: Payer: Self-pay | Admitting: *Deleted

## 2017-11-01 ENCOUNTER — Encounter: Payer: Self-pay | Admitting: Internal Medicine

## 2017-11-01 ENCOUNTER — Ambulatory Visit (INDEPENDENT_AMBULATORY_CARE_PROVIDER_SITE_OTHER): Payer: Medicare Other | Admitting: Internal Medicine

## 2017-11-01 VITALS — BP 126/72 | HR 89 | Ht 66.0 in | Wt 254.0 lb

## 2017-11-01 DIAGNOSIS — I480 Paroxysmal atrial fibrillation: Secondary | ICD-10-CM

## 2017-11-01 MED ORDER — AMLODIPINE BESYLATE 10 MG PO TABS: 5.0000 mg | ORAL_TABLET | Freq: Every day | ORAL | Status: DC

## 2017-11-01 MED ORDER — SPIRONOLACTONE 25 MG PO TABS: 25.0000 mg | ORAL_TABLET | Freq: Every day | ORAL | 3 refills | Status: DC

## 2017-11-01 NOTE — Progress Notes (Signed)
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 female who presents today for routine electrophysiology followup.  Since last being seen in our clinic, the patient reports doing reasonably well. She has had difficulty recently with volume overload.  + SOB and edema.  She has gained 11 lbs since I saw her last.  She has not been compliant with diet restrictions  (per son eats a lot of fast food).  And also has not been taking lasix as scheduled.  Today, she denies symptoms of palpitations, chest pain, dizziness, presyncope, or syncope.  The patient is otherwise without complaint today.   Past Medical History:  Diagnosis Date  . Asthma   . Chronic anticoagulation   . COPD (chronic obstructive pulmonary disease) ([place])   . Diabetes mellitus without complication ([place])    TYPE 2  . DJD (degenerative joint disease)   . GERD (gastroesophageal reflux disease)   . History of home oxygen therapy    oxygen 2 liters per nasal cannula at hs  . HTN (hypertension)   . Hyperlipidemia   . Mobitz (type) II atrioventricular block   . Obesity   . Pacemaker    Implanted  by Dr [name] (MDT) 10/06/10  . PAF (paroxysmal atrial fibrillation) ([place])   . Pancreatitis 2010 OR 2011   pt thought this had been ruled out  . Sleep apnea    CPA SETTING OF 14   Past Surgical History:  Procedure Laterality Date  . CATARACT EXTRACTION W/PHACO  11/02/2011   Procedure: CATARACT EXTRACTION PHACO AND INTRAOCULAR LENS PLACEMENT (IOC);  Surgeon: Williams Che;  Location: [facility];  Service: Ophthalmology;  Laterality: Right;  CDE=7.33  . CATARACT EXTRACTION W/PHACO  12/07/2011   Procedure: CATARACT EXTRACTION PHACO AND INTRAOCULAR LENS PLACEMENT (IOC);  Surgeon: Williams Che, MD;  Location: [facility];  Service: Ophthalmology;  Laterality: Left;  CDE 3.61  . COLONOSCOPY WITH PROPOFOL N/A 08/09/2014   Procedure: COLONOSCOPY WITH PROPOFOL;  Surgeon: Juanita Craver, MD;  Location: [facility];   Service: Endoscopy;  Laterality: N/A;  . CYSTOCELE REPAIR    . ESOPHAGOGASTRODUODENOSCOPY (EGD) WITH PROPOFOL N/A 08/09/2014   Procedure: ESOPHAGOGASTRODUODENOSCOPY (EGD) WITH PROPOFOL;  Surgeon: Juanita Craver, MD;  Location: [facility];  Service: Endoscopy;  Laterality: N/A;  . EYE SURGERY  11/01/2012   BOTH EYES CATARACTS  . INSERT / REPLACE / REMOVE PACEMAKER  10/06/10   MDT  implanted by Dr [name]  . KNEE ARTHROSCOPY     both  . OVARY SURGERY     removal  . REPAIR RECTOCELE    . SIMPLE MASTECTOMY WITH AXILLARY SENTINEL NODE BIOPSY Left 01/09/2015   Procedure: Irrigation and Drainage Abcess left Axilla;  Surgeon: Jackolyn Confer, MD;  Location: [facility];  Service: General;  Laterality: Left;  . TONSILLECTOMY  AGE 84  . TOTAL ABDOMINAL HYSTERECTOMY  1971    ROS- all systems are reviewed and negative except as per HPI above  Current Outpatient Medications  Medication Sig Dispense Refill  . albuterol (PROAIR HFA) 108 (90 BASE) MCG/ACT inhaler Inhale 2 puffs into the lungs every 4 (four) hours as needed for wheezing. Inhale 2 puffs into the lungs every 4 (four) hours as needed.    Marland Kitchen amLODipine (NORVASC) 10 MG tablet TAKE 1 TABLET BY MOUTH ONCE DAILY 90 tablet 3  . atorvastatin (LIPITOR) 40 MG tablet Take 40 mg by mouth daily.     . budesonide-formoterol (SYMBICORT)  160-4.5 MCG/ACT inhaler Inhale 2 puffs into the lungs 2 (two) times daily.    Marland Kitchen ELIQUIS 5 MG TABS tablet TAKE ONE TABLET BY MOUTH TWICE DAILY 60 tablet 5  . fenofibrate 160 MG tablet Take 160 mg by mouth every evening.    . furosemide (LASIX) 20 MG tablet Take 40 mg by mouth daily.     Marland Kitchen glipiZIDE (GLUCOTROL XL) 10 MG 24 hr tablet Take 10 mg by mouth 2 (two) times daily.     Marland Kitchen ipratropium-albuterol (DUONEB) 0.5-2.5 (3) MG/3ML SOLN Take 3 mLs by nebulization 2 (two) times daily.    Marland Kitchen LORazepam (ATIVAN) 0.5 MG tablet Take 1 tablet by mouth every 6 (six) hours as needed for anxiety.     Marland Kitchen losartan (COZAAR) 100 MG tablet Take 100  mg by mouth every evening.    . metFORMIN (GLUCOPHAGE) 1000 MG tablet Take 1,000 mg by mouth 2 (two) times daily with a meal.    . methocarbamol (ROBAXIN) 500 MG tablet Take 1 tablet by mouth every 6 (six) hours as needed for pain.    . metoprolol succinate (TOPROL-XL) 100 MG 24 hr tablet TAKE 1 TABLET BY MOUTH ONCE DAILY 90 tablet 3  . oxybutynin (DITROPAN-XL) 10 MG 24 hr tablet Take 10 mg by mouth daily.     . Probiotic Product (ALIGN PO) Take 1 capsule by mouth daily.     Marland Kitchen spironolactone (ALDACTONE) 25 MG tablet Take 12.5 mg by mouth daily.    . traMADol (ULTRAM) 50 MG tablet Take 1 tablet by mouth every 6 (six) hours as needed for pain.    . nitroGLYCERIN (NITROSTAT) 0.4 MG SL tablet Place 1 tablet (0.4 mg total) under the tongue every 5 (five) minutes as needed for chest pain. 25 tablet 3   No current facility-administered medications for this visit.     Physical Exam: Vitals:   11/01/17 1551  BP: 126/72  Pulse: 89  SpO2: 94%  Weight: 254 lb (115.2 kg)  Height: 5\' 6"  (1.676 m)    GEN- The patient is overweight appearing, alert and oriented x 3 today.   Head- normocephalic, atraumatic Eyes-  Sclera clear, conjunctiva pink Ears- hearing intact Oropharynx- clear Lungs- Clear to ausculation bilaterally, normal work of breathing Chest- pacemaker pocket is well healed Heart- Regular rate and rhythm, no murmurs, rubs or gallops, PMI not laterally displaced GI- soft, NT, ND, + BS Extremities- no clubbing, cyanosis, + dependant edema  Pacemaker interrogation- reviewed in detail today,  See PACEART report  ekg tracing 10/20/17 is personally reviewed and shows sinus with V pacing   Assessment and Plan:  1. Symptomatic AV block heart block Normal pacemaker function See Pace Art report No changes today Review reveals that she has been programmed DDIR since 06/28/2013 due to atrial tachycardia with bimodal pacing .  This appears to have resolved.  We could probably reprogram MVP on  however her overall V pacing was not very different.  I dont think this would change symptoms.  2. HTN Stable No change required today  3. Tobacco She is trying to quit  3. afib Maintaining sinus rhythm (AF burden is 2.6 % --> 1.5% last year) Continue on eliquis  4. Obesity Not making much progress with lifestyle modification Body mass index is 41 kg/m.  5. Acute on chronic diastolic dysfunction Has recently had worsening issues with fluid overload.  Renal function is normal, no significant increase in AF burden.  Pulmonary HTN and increased LA pressure noted.  May be related to obesity.  Could consider sleep apnea as a cause also. Reduce norvasc to 5mg  daily which may be contributing to swelling Increase spironolactone to 25mg  daily Will need bmet and follow-up with Cecille Rubin in 6 weeks or so  [facility] Return to see me in a year   Thompson Grayer MD, [facility] 11/01/2017 4:22 PM

## 2017-11-01 NOTE — Patient Instructions (Addendum)
Medication Instructions:  Your physician has recommended you make the following change in your medication:  1) Decrease Amlodipine to 5 mg daily 2) Increase Spironolactone to 25 mg daily   Labwork: None ordered   Testing/Procedures: None ordered   Follow-Up: Remote monitoring is used to monitor your Pacemaker from home. This monitoring reduces the number of office visits required to check your device to one time per year. It allows [place] to keep an eye on the functioning of your device to ensure it is working properly. You are scheduled for a device check from home on 11/18/17. You may send your transmission at any time that day. If you have a wireless device, the transmission will be sent automatically. After your physician reviews your transmission, you will receive a postcard with your next transmission date.    Your physician wants you to follow-up in: 12 months with Dr Vallery Ridge will receive a reminder letter in the mail two months in advance. If you don't receive a letter, please call our office to schedule the follow-up appointment.   Any Other Special Instructions Will Be Listed Below (If Applicable).    Low-Sodium Eating Plan Sodium, which is an element that makes up salt, helps you maintain a healthy balance of fluids in your body. Too much sodium can increase your blood pressure and cause fluid and waste to be held in your body. Your health care provider or dietitian may recommend following this plan if you have high blood pressure (hypertension), kidney disease, liver disease, or heart failure. Eating less sodium can help lower your blood pressure, reduce swelling, and protect your heart, liver, and kidneys. What are tips for following this plan? General guidelines  Most people on this plan should limit their sodium intake to 2,000 mg (milligrams) of sodium each day. Reading food labels  The Nutrition Facts label lists the amount of sodium in one serving of the food. If you  eat more than one serving, you must multiply the listed amount of sodium by the number of servings.  Choose foods with less than 140 mg of sodium per serving.  Avoid foods with 300 mg of sodium or more per serving. Shopping  Look for lower-sodium products, often labeled as "low-sodium" or "no salt added."  Always check the sodium content even if foods are labeled as "unsalted" or "no salt added".  Buy fresh foods. ? Avoid canned foods and premade or frozen meals. ? Avoid canned, cured, or processed meats  Buy breads that have less than 80 mg of sodium per slice. Cooking  Eat more home-cooked food and less restaurant, buffet, and fast food.  Avoid adding salt when cooking. Use salt-free seasonings or herbs instead of table salt or sea salt. Check with your health care provider or pharmacist before using salt substitutes.  Cook with plant-based oils, such as canola, sunflower, or olive oil. Meal planning  When eating at a restaurant, ask that your food be prepared with less salt or no salt, if possible.  Avoid foods that contain MSG (monosodium glutamate). MSG is sometimes added to [place] food, bouillon, and some canned foods. What foods are recommended? The items listed may not be a complete list. Talk with your dietitian about what dietary choices are best for you. Grains Low-sodium cereals, including oats, puffed wheat and rice, and shredded wheat. Low-sodium crackers. Unsalted rice. Unsalted pasta. Low-sodium bread. Whole-grain breads and whole-grain pasta. Vegetables Fresh or frozen vegetables. "No salt added" canned vegetables. "No salt added" tomato sauce  and paste. Low-sodium or reduced-sodium tomato and vegetable juice. Fruits Fresh, frozen, or canned fruit. Fruit juice. Meats and other protein foods Fresh or frozen (no salt added) meat, poultry, seafood, and fish. Low-sodium canned tuna and salmon. Unsalted nuts. Dried peas, beans, and lentils without added salt.  Unsalted canned beans. Eggs. Unsalted nut butters. Dairy Milk. Soy milk. Cheese that is naturally low in sodium, such as ricotta cheese, fresh mozzarella, or Swiss cheese Low-sodium or reduced-sodium cheese. Cream cheese. Yogurt. Fats and oils Unsalted butter. Unsalted margarine with no trans fat. Vegetable oils such as canola or olive oils. Seasonings and other foods Fresh and dried herbs and spices. Salt-free seasonings. Low-sodium mustard and ketchup. Sodium-free salad dressing. Sodium-free light mayonnaise. Fresh or refrigerated horseradish. Lemon juice. Vinegar. Homemade, reduced-sodium, or low-sodium soups. Unsalted popcorn and pretzels. Low-salt or salt-free chips. What foods are not recommended? The items listed may not be a complete list. Talk with your dietitian about what dietary choices are best for you. Grains Instant hot cereals. Bread stuffing, pancake, and biscuit mixes. Croutons. Seasoned rice or pasta mixes. Noodle soup cups. Boxed or frozen macaroni and cheese. Regular salted crackers. Self-rising flour. Vegetables Sauerkraut, pickled vegetables, and relishes. Olives. [place] fries. Onion rings. Regular canned vegetables (not low-sodium or reduced-sodium). Regular canned tomato sauce and paste (not low-sodium or reduced-sodium). Regular tomato and vegetable juice (not low-sodium or reduced-sodium). Frozen vegetables in sauces. Meats and other protein foods Meat or fish that is salted, canned, smoked, spiced, or pickled. Bacon, ham, sausage, hotdogs, corned beef, chipped beef, packaged lunch meats, salt pork, jerky, pickled herring, anchovies, regular canned tuna, sardines, salted nuts. Dairy Processed cheese and cheese spreads. Cheese curds. Blue cheese. Feta cheese. String cheese. Regular cottage cheese. Buttermilk. Canned milk. Fats and oils Salted butter. Regular margarine. Ghee. Bacon fat. Seasonings and other foods Onion salt, garlic salt, seasoned salt, table salt, and sea  salt. Canned and packaged gravies. Worcestershire sauce. Tartar sauce. Barbecue sauce. Teriyaki sauce. Soy sauce, including reduced-sodium. Steak sauce. Fish sauce. Oyster sauce. Cocktail sauce. Horseradish that you find on the shelf. Regular ketchup and mustard. Meat flavorings and tenderizers. Bouillon cubes. Hot sauce and Tabasco sauce. Premade or packaged marinades. Premade or packaged taco seasonings. Relishes. Regular salad dressings. Salsa. Potato and tortilla chips. Corn chips and puffs. Salted popcorn and pretzels. Canned or dried soups. Pizza. Frozen entrees and pot pies. Summary  Eating less sodium can help lower your blood pressure, reduce swelling, and protect your heart, liver, and kidneys.  Most people on this plan should limit their sodium intake to 1,500-2,000 mg (milligrams) of sodium each day.  Canned, boxed, and frozen foods are high in sodium. Restaurant foods, fast foods, and pizza are also very high in sodium. You also get sodium by adding salt to food.  Try to cook at home, eat more fresh fruits and vegetables, and eat less fast food, canned, processed, or prepared foods. This information is not intended to replace advice given to you by your health care provider. Make sure you discuss any questions you have with your health care provider. Document Released: 04/24/2002 Document Revised: 10/26/2016 Document Reviewed: 10/26/2016 Elsevier Interactive Patient Education  2017 [employer].

## 2017-11-02 LAB — CUP PACEART INCLINIC DEVICE CHECK
Battery Impedance: 545 Ohm
Battery Remaining Longevity: 75 mo
Battery Voltage: 2.77 V
Brady Statistic AP VP Percent: 76 %
Brady Statistic AS VP Percent: 11 %
Brady Statistic AS VS Percent: 12 %
Implantable Lead Implant Date: 20111121
Implantable Lead Implant Date: 20111121
Implantable Lead Location: 753860
Implantable Lead Model: 4469
Implantable Lead Model: 4470
Implantable Lead Serial Number: 548226
Implantable Pulse Generator Implant Date: 20111121
Lead Channel Impedance Value: 500 Ohm
Lead Channel Pacing Threshold Pulse Width: 0.4 ms
Lead Channel Pacing Threshold Pulse Width: 0.4 ms
Lead Channel Sensing Intrinsic Amplitude: 15.67 mV
Lead Channel Sensing Intrinsic Amplitude: 2.8 mV
Lead Channel Setting Pacing Amplitude: 2 V
MDC IDC LEAD LOCATION: 753859
MDC IDC LEAD SERIAL: 687643
MDC IDC MSMT LEADCHNL RA IMPEDANCE VALUE: 449 Ohm
MDC IDC MSMT LEADCHNL RA PACING THRESHOLD AMPLITUDE: 0.5 V
MDC IDC MSMT LEADCHNL RV PACING THRESHOLD AMPLITUDE: 0.625 V
MDC IDC MSMT LEADCHNL RV PACING THRESHOLD AMPLITUDE: 0.75 V
MDC IDC MSMT LEADCHNL RV PACING THRESHOLD PULSEWIDTH: 0.4 ms
MDC IDC SESS DTM: 20181217215830
MDC IDC SET LEADCHNL RV PACING AMPLITUDE: 2.5 V
MDC IDC SET LEADCHNL RV PACING PULSEWIDTH: 0.4 ms
MDC IDC SET LEADCHNL RV SENSING SENSITIVITY: 4 mV
MDC IDC STAT BRADY AP VS PERCENT: 0 %

## 2017-11-18 ENCOUNTER — Telehealth: Payer: Self-pay | Admitting: Cardiology

## 2017-11-18 ENCOUNTER — Encounter: Payer: Medicare Other | Admitting: *Deleted

## 2017-11-18 NOTE — Telephone Encounter (Signed)
LMOVM reminding pt to send remote transmission.   

## 2017-11-19 ENCOUNTER — Encounter: Payer: Self-pay | Admitting: Cardiology

## 2017-11-25 ENCOUNTER — Other Ambulatory Visit: Payer: Self-pay | Admitting: Internal Medicine

## 2017-11-25 NOTE — Telephone Encounter (Signed)
Pt last saw Dr [name] 11/01/17, last labs 10/20/17 Creat 0.84, age 77, weight 115.2kg, based on specified criteria pt is on appropriate dosage of Eliquis 5mg  BID.  Will refill rx.

## 2017-11-30 ENCOUNTER — Ambulatory Visit (INDEPENDENT_AMBULATORY_CARE_PROVIDER_SITE_OTHER): Payer: Medicare HMO | Admitting: *Deleted

## 2017-11-30 DIAGNOSIS — I441 Atrioventricular block, second degree: Secondary | ICD-10-CM | POA: Diagnosis not present

## 2017-12-01 NOTE — Progress Notes (Signed)
Remote pacemaker transmission.   

## 2017-12-02 LAB — CUP PACEART REMOTE DEVICE CHECK
Battery Impedance: 622 Ohm
Battery Voltage: 2.77 V
Brady Statistic AP VP Percent: 88 %
Brady Statistic AS VP Percent: 10 %
Date Time Interrogation Session: 20190115174426
Implantable Lead Implant Date: 20111121
Implantable Lead Location: 753859
Implantable Lead Model: 4469
Implantable Lead Model: 4470
Implantable Lead Serial Number: 687643
Implantable Pulse Generator Implant Date: 20111121
Lead Channel Pacing Threshold Amplitude: 0.75 V
Lead Channel Pacing Threshold Pulse Width: 0.4 ms
Lead Channel Setting Pacing Amplitude: 2.5 V
Lead Channel Setting Pacing Pulse Width: 0.4 ms
MDC IDC LEAD IMPLANT DT: 20111121
MDC IDC LEAD LOCATION: 753860
MDC IDC LEAD SERIAL: 548226
MDC IDC MSMT BATTERY REMAINING LONGEVITY: 69 mo
MDC IDC MSMT LEADCHNL RA IMPEDANCE VALUE: 442 Ohm
MDC IDC MSMT LEADCHNL RV IMPEDANCE VALUE: 526 Ohm
MDC IDC SET LEADCHNL RA PACING AMPLITUDE: 2 V
MDC IDC SET LEADCHNL RV SENSING SENSITIVITY: 4 mV
MDC IDC STAT BRADY AP VS PERCENT: 0 %
MDC IDC STAT BRADY AS VS PERCENT: 2 %

## 2017-12-03 ENCOUNTER — Encounter: Payer: Self-pay | Admitting: Cardiology

## 2017-12-21 NOTE — Progress Notes (Signed)
CARDIOLOGY OFFICE NOTE  Date:  12/22/2017    Brandi Hunter Date of Birth: 1941/02/21 Medical Record #[id number]  PCP:  [name], FNP  Cardiologist:  Servando [name]    Chief Complaint  Patient presents with  . Atrial Fibrillation  . Hypertension    Follow up visit - seen for Dr. [name]    History of Present Illness: Brandi Hunter is a 77 y.o. female who presents today for a 2 month check. Seen for Dr. [name].   She has had AV block with PPM dating back to 2011, HTN, diabetes, obesity, HLD, DJD, asthma, COPD with ongoing tobacco abuse, OSA and depression. Low risk Myoview in June of 2018. Normal EF per echo in June of 2015. She has had PAF - now on anticoagulation with Eliquis.   Last seen by me back in June of 2018 - was having some chest tightness - Myoview updated and this was felt to be low risk.  Seen by Dr. [name] in December of 2017 - felt to be stable. She has continued to smoke. Lipids followed by PCP.   Admitted in November with COPD exacerbation/hypoxia. Treated with antibiotics and steroid therapy. Strongly counseled in smoking cessation. I then saw her in December - she was improving. She saw Dr. [name] in December as well - he made some med changes but overall she was felt to be doing ok. She has seen pulmonary as well.   Comes in today. Here with her son. She notes that she is doing ok. She is not smoking. She says her breathing is basically ok. She still has some swelling and did not cut her Norvasc back as instructed - she forgot. No chest pain. She says she knows "I need to get off my butt and move". Balance is an issue - she has started using a walker.   Past Medical History:  Diagnosis Date  . Asthma   . Chronic anticoagulation   . COPD (chronic obstructive pulmonary disease) ([place])   . Diabetes mellitus without complication ([place])    TYPE 2  . DJD (degenerative joint disease)   . GERD (gastroesophageal reflux disease)   . History of home  oxygen therapy    oxygen 2 liters per nasal cannula at hs  . HTN (hypertension)   . Hyperlipidemia   . Mobitz (type) II atrioventricular block   . Obesity   . Pacemaker    Implanted  by Dr [name] (MDT) 10/06/10  . PAF (paroxysmal atrial fibrillation) ([place])   . Pancreatitis 2010 OR 2011   pt thought this had been ruled out  . Sleep apnea    CPA SETTING OF 14    Past Surgical History:  Procedure Laterality Date  . CATARACT EXTRACTION W/PHACO  11/02/2011   Procedure: CATARACT EXTRACTION PHACO AND INTRAOCULAR LENS PLACEMENT (IOC);  Surgeon: Williams Che;  Location: [facility];  Service: Ophthalmology;  Laterality: Right;  CDE=7.33  . CATARACT EXTRACTION W/PHACO  12/07/2011   Procedure: CATARACT EXTRACTION PHACO AND INTRAOCULAR LENS PLACEMENT (IOC);  Surgeon: Williams Che, MD;  Location: [facility];  Service: Ophthalmology;  Laterality: Left;  CDE 3.61  . COLONOSCOPY WITH PROPOFOL N/A 08/09/2014   Procedure: COLONOSCOPY WITH PROPOFOL;  Surgeon: Juanita Craver, MD;  Location: [facility];  Service: Endoscopy;  Laterality: N/A;  . CYSTOCELE REPAIR    . ESOPHAGOGASTRODUODENOSCOPY (EGD) WITH PROPOFOL N/A 08/09/2014   Procedure: ESOPHAGOGASTRODUODENOSCOPY (EGD) WITH PROPOFOL;  Surgeon: Juanita Craver, MD;  Location: [facility];  Service: Endoscopy;  Laterality: N/A;  . EYE SURGERY  11/01/2012   BOTH EYES CATARACTS  . INSERT / REPLACE / REMOVE PACEMAKER  10/06/10   MDT  implanted by Dr [name]  . KNEE ARTHROSCOPY     both  . OVARY SURGERY     removal  . REPAIR RECTOCELE    . SIMPLE MASTECTOMY WITH AXILLARY SENTINEL NODE BIOPSY Left 01/09/2015   Procedure: Irrigation and Drainage Abcess left Axilla;  Surgeon: Jackolyn Confer, MD;  Location: [facility];  Service: General;  Laterality: Left;  . TONSILLECTOMY  AGE 32  . TOTAL ABDOMINAL HYSTERECTOMY  1971     Medications: Current Meds  Medication Sig  . albuterol (PROAIR HFA) 108 (90 BASE) MCG/ACT inhaler Inhale 2 puffs into the lungs every 4  (four) hours as needed for wheezing. Inhale 2 puffs into the lungs every 4 (four) hours as needed.  Marland Kitchen apixaban (ELIQUIS) 5 MG TABS tablet Take 1 tablet (5 mg total) by mouth 2 (two) times daily.  Marland Kitchen atorvastatin (LIPITOR) 40 MG tablet Take 40 mg by mouth daily.   . budesonide-formoterol (SYMBICORT) 160-4.5 MCG/ACT inhaler Inhale 2 puffs into the lungs 2 (two) times daily.  . fenofibrate 160 MG tablet Take 160 mg by mouth every evening.  Marland Kitchen glipiZIDE (GLUCOTROL XL) 10 MG 24 hr tablet Take 10 mg by mouth 2 (two) times daily.   Marland Kitchen ipratropium-albuterol (DUONEB) 0.5-2.5 (3) MG/3ML SOLN Take 3 mLs by nebulization 2 (two) times daily.  Marland Kitchen LORazepam (ATIVAN) 0.5 MG tablet Take 1 tablet by mouth every 6 (six) hours as needed for anxiety.   Marland Kitchen losartan (COZAAR) 100 MG tablet Take 100 mg by mouth every evening.  . metFORMIN (GLUCOPHAGE) 1000 MG tablet Take 1,000 mg by mouth 2 (two) times daily with a meal.  . methocarbamol (ROBAXIN) 500 MG tablet Take 1 tablet by mouth every 6 (six) hours as needed for pain.  . metoprolol succinate (TOPROL-XL) 100 MG 24 hr tablet TAKE 1 TABLET BY MOUTH ONCE DAILY  . oxybutynin (DITROPAN-XL) 10 MG 24 hr tablet Take 10 mg by mouth daily.   . Probiotic Product (ALIGN PO) Take 1 capsule by mouth daily.   Marland Kitchen spironolactone (ALDACTONE) 25 MG tablet Take 1 tablet (25 mg total) by mouth daily.  . traMADol (ULTRAM) 50 MG tablet Take 1 tablet by mouth every 6 (six) hours as needed for pain.  . [DISCONTINUED] amLODipine (NORVASC) 10 MG tablet Take 0.5 tablets (5 mg total) by mouth daily.  . [DISCONTINUED] ELIQUIS 5 MG TABS tablet TAKE ONE TABLET BY MOUTH TWICE DAILY  . [DISCONTINUED] furosemide (LASIX) 20 MG tablet Take 40 mg by mouth daily.      Allergies: Allergies  Allergen Reactions  . Bee Venom Swelling  . Latex Swelling    LATEX CATHETERS    Social History: The patient  reports that she quit smoking about 2 months ago. Her smoking use included cigarettes. She has a 50.00  pack-year smoking history. She quit smokeless tobacco use about 6 years ago. She reports that she drinks about 0.6 oz of alcohol per week. She reports that she does not use drugs.   Family History: The patient's family history includes Congestive Heart Failure in her father and mother; Diabetes in her brother; Osteoarthritis in her sister; Prostate cancer in her brother.   Review of Systems: Please see the history of present illness.   Otherwise, the review of systems is positive for none.   All other systems are reviewed and  negative.   Physical Exam: VS:  BP 132/80 (BP Location: Left Arm, Patient Position: Sitting, Cuff Size: Large)   Pulse 79   Ht 5\' 6"  (1.676 m)   Wt 251 lb 12.8 oz (114.2 kg)   SpO2 93% Comment: at rest  BMI 40.64 kg/m  .  BMI Body mass index is 40.64 kg/m.  Wt Readings from Last 3 Encounters:  12/22/17 251 lb 12.8 oz (114.2 kg)  11/01/17 254 lb (115.2 kg)  10/22/17 240 lb (108.9 kg)    General: Pleasant. Obese. Alert and in no acute distress.   HEENT: Normal.  Neck: Supple, no JVD, carotid bruits, or masses noted.  Cardiac: Regular rate and rhythm. No murmurs, rubs, or gallops. No edema.  Respiratory:  Lungs are clear to auscultation bilaterally with normal work of breathing.  GI: Soft and nontender. Obese.  MS: No deformity or atrophy. Gait and ROM intact.  Skin: Warm and dry. Color is normal.  Neuro:  Strength and sensation are intact and no gross focal deficits noted.  Psych: Alert, appropriate and with normal affect.   LABORATORY DATA:  EKG:  EKG is not ordered today.  Lab Results  Component Value Date   WBC 6.2 10/20/2017   HGB 13.3 10/20/2017   HCT 40.0 10/20/2017   PLT 229 10/20/2017   GLUCOSE 179 (H) 10/20/2017   ALT 22 09/27/2017   AST 22 09/27/2017   NA 140 10/20/2017   K 4.1 10/20/2017   CL 97 10/20/2017   CREATININE 0.84 10/20/2017   BUN 15 10/20/2017   CO2 29 10/20/2017   TSH 2.430 05/03/2017   INR 1.13 01/08/2015     BNP  (last 3 results) No results for input(s): BNP in the last 8760 hours.  ProBNP (last 3 results) Recent Labs    10/20/17 1033  PROBNP 257     Other Studies Reviewed Today:  Echo Study Conclusions December 2018  - Left ventricle: The cavity size was normal. There was moderate   concentric hypertrophy. Systolic function was normal. The   estimated ejection fraction was in the range of 60% to 65%. Wall   motion was normal; there were no regional wall motion   abnormalities. Features are consistent with a pseudonormal left   ventricular filling pattern, with concomitant abnormal relaxation   and increased filling pressure (grade 2 diastolic dysfunction). - Left atrium: The atrium was mildly dilated. - Atrial septum: The septum bowed from left to right, consistent   with increased left atrial pressure. No defect or patent foramen   ovale was identified. - Pulmonary arteries: Systolic pressure was mildly increased. PA   peak pressure: 37 mm Hg (S).    Myoview Study Highlights 04/2017   Nuclear stress EF: 65%.  Probable normal perfusion and soft tissue attenuation  This is a low risk study.   Echo Study Conclusions 2015  - Left ventricle: The cavity size was normal. Systolic function was normal. The estimated ejection fraction was in the range of 60% to 65%. Wall motion was normal; there were no regional wall motion abnormalities. Doppler parameters are consistent with abnormal left ventricular relaxation (grade 1 diastolic dysfunction). - Aortic valve: Trileaflet; normal thickness leaflets. There was no regurgitation. - Aortic root: The aortic root was normal in size. - Mitral valve: There was mild regurgitation. - Left atrium: The atrium was mildly dilated. - Right ventricle: Systolic function was normal. - Right atrium: The atrium was normal in size. - Pulmonic valve: There was no regurgitation. -  Pulmonary arteries: Systolic pressure was within the  normal range. - Pericardium, extracardiac: There was no pericardial effusion.  Impressions:  - Normal biventricular size and function. Impaired relaxation. Mild mitral regurgitation.  Assessment/Plan:  1. Prior admission for COPD exacerbation/hypoxia - improved - has stopped smoking. Following with pulmonary.   2. Diastolic HF - still with some swelling on exam. Remains on her Lasix - will cut the Norvasc as advised by Dr. [name].  3. Chest pain - low risk Myoview from back in the summer - she denies recurrence.   4. Underlying PPM for Mobitz type 2 - followed by EP  5. HTN - BP ok on current regimen - Aldactone was increased and we will cut the Norvasc back today  6. Obesity - she knows what she needs to be doing.   7. PAF - in sinus today. On Eliquis - no adverse reactions noted.   8. Tobacco abuse - has not smoked since November. She is Advertising account executive.     Current medicines are reviewed with the patient today.  The patient does not have concerns regarding medicines other than what has been noted above.  The following changes have been made:  See above.  Labs/ tests ordered today include:    Orders Placed This Encounter  Procedures  . Basic metabolic panel     Disposition:   FU with me in about 3 months. Lab today.    Patient is agreeable to this plan and will call if any problems develop in the interim.   SignedTruitt Merle, NP  12/22/2017 9:02 AM  [facility] [address] [place], [place]  [address] Phone: [phone] Fax: [phone]

## 2017-12-22 ENCOUNTER — Encounter: Payer: Self-pay | Admitting: Nurse Practitioner

## 2017-12-22 ENCOUNTER — Ambulatory Visit: Payer: Medicare HMO | Admitting: Nurse Practitioner

## 2017-12-22 VITALS — BP 132/80 | HR 79 | Ht 66.0 in | Wt 251.8 lb

## 2017-12-22 DIAGNOSIS — Z79899 Other long term (current) drug therapy: Secondary | ICD-10-CM

## 2017-12-22 DIAGNOSIS — I480 Paroxysmal atrial fibrillation: Secondary | ICD-10-CM | POA: Diagnosis not present

## 2017-12-22 DIAGNOSIS — I10 Essential (primary) hypertension: Secondary | ICD-10-CM | POA: Diagnosis not present

## 2017-12-22 DIAGNOSIS — Z950 Presence of cardiac pacemaker: Secondary | ICD-10-CM

## 2017-12-22 LAB — BASIC METABOLIC PANEL
BUN/Creatinine Ratio: 20 (ref 12–28)
BUN: 26 mg/dL (ref 8–27)
CO2: 28 mmol/L (ref 20–29)
Calcium: 9.6 mg/dL (ref 8.7–10.3)
Chloride: 98 mmol/L (ref 96–106)
Creatinine, Ser: 1.28 mg/dL — ABNORMAL HIGH (ref 0.57–1.00)
GFR calc Af Amer: 48 mL/min/{1.73_m2} — ABNORMAL LOW (ref >59)
GFR calc non Af Amer: 41 mL/min/{1.73_m2} — ABNORMAL LOW (ref >59)
Glucose: 264 mg/dL — ABNORMAL HIGH (ref 65–99)
Potassium: 4.1 mmol/L (ref 3.5–5.2)
Sodium: 140 mmol/L (ref 134–144)

## 2017-12-22 MED ORDER — AMLODIPINE BESYLATE 5 MG PO TABS: 5.0000 mg | ORAL_TABLET | Freq: Every day | ORAL | 3 refills | Status: DC

## 2017-12-22 MED ORDER — FUROSEMIDE 40 MG PO TABS: 40.0000 mg | ORAL_TABLET | Freq: Every day | ORAL | 3 refills | Status: DC

## 2017-12-22 MED ORDER — APIXABAN 5 MG PO TABS: 5.0000 mg | ORAL_TABLET | Freq: Two times a day (BID) | ORAL | 3 refills | Status: DC

## 2017-12-22 NOTE — Patient Instructions (Addendum)
We will be checking the following labs today - BMET   Medication Instructions:    Continue with your current medicines. BUT   Cut the Amlodipine back to just half a tablet. I am giving you a RX for the 5 mg tablet.   I refilled the Eliquis today  I refilled the Lasix - it will be a 40 mg tablet    Testing/Procedures To Be Arranged:  N/A  Follow-Up:   See me in 3 months    Other Special Instructions:   N/A    If you need a refill on your cardiac medications before your next appointment, please call your pharmacy.   Call the [facility] office at [phone] if you have any questions, problems or concerns.

## 2018-03-01 ENCOUNTER — Telehealth: Payer: Self-pay | Admitting: Cardiology

## 2018-03-01 ENCOUNTER — Ambulatory Visit (INDEPENDENT_AMBULATORY_CARE_PROVIDER_SITE_OTHER): Payer: Medicare HMO | Admitting: *Deleted

## 2018-03-01 DIAGNOSIS — I441 Atrioventricular block, second degree: Secondary | ICD-10-CM

## 2018-03-01 NOTE — Telephone Encounter (Signed)
LMOVM reminding pt to send remote transmission.   

## 2018-03-02 LAB — CUP PACEART REMOTE DEVICE CHECK
Battery Remaining Longevity: 65 mo
Brady Statistic AP VP Percent: 89 %
Date Time Interrogation Session: 20190416201726
Implantable Lead Implant Date: 20111121
Implantable Lead Location: 753860
Implantable Lead Serial Number: 687643
Implantable Pulse Generator Implant Date: 20111121
Lead Channel Pacing Threshold Amplitude: 0.625 V
Lead Channel Setting Pacing Amplitude: 2 V
Lead Channel Setting Pacing Pulse Width: 0.4 ms
MDC IDC LEAD IMPLANT DT: 20111121
MDC IDC LEAD LOCATION: 753859
MDC IDC LEAD SERIAL: 548226
MDC IDC MSMT BATTERY IMPEDANCE: 697 Ohm
MDC IDC MSMT BATTERY VOLTAGE: 2.77 V
MDC IDC MSMT LEADCHNL RA IMPEDANCE VALUE: 436 Ohm
MDC IDC MSMT LEADCHNL RV IMPEDANCE VALUE: 502 Ohm
MDC IDC MSMT LEADCHNL RV PACING THRESHOLD PULSEWIDTH: 0.4 ms
MDC IDC SET LEADCHNL RV PACING AMPLITUDE: 2.5 V
MDC IDC SET LEADCHNL RV SENSING SENSITIVITY: 4 mV
MDC IDC STAT BRADY AP VS PERCENT: 0 %
MDC IDC STAT BRADY AS VP PERCENT: 8 %
MDC IDC STAT BRADY AS VS PERCENT: 3 %

## 2018-03-02 NOTE — Progress Notes (Signed)
Remote pacemaker transmission.   

## 2018-03-03 ENCOUNTER — Encounter: Payer: Self-pay | Admitting: Cardiology

## 2018-03-22 NOTE — Progress Notes (Addendum)
CARDIOLOGY OFFICE NOTE  Date:  03/23/2018    Brandi Hunter Date of Birth: 1941/05/26 Medical Record #[id number]  PCP:  [name], [place]  Cardiologist:  Servando [name]    Chief Complaint  Patient presents with  . Irregular Heart Beat  . Shortness of Breath  . Hypertension    Follow up visit - seen for Dr. [name]    History of Present Illness: Brandi Hunter is a 77 y.o. female who presents today for a follow up visit. Seen for Dr. [name].   She has had AV block with PPM dating back to 2011, HTN, diabetes, obesity, HLD, DJD, asthma, COPD with ongoing tobacco abuse, OSA and depression.Low riskMyoview in June of 2018. Normal EF per echo in June of 2015. She has had PAF - now on anticoagulation with Eliquis.   Seen by me back inJune of 2018 - was having some chest tightness - Myoview updated and this was felt to be low risk. She has continued to smoke.Lipids followed by PCP.  Admitted in November of 2018 with COPD exacerbation/hypoxia. Treated with antibiotics and steroid therapy. Strongly counseled in smoking cessation.I then saw her in December - she was improving. She saw Dr. [name] in December as well - he made some med changes but overall she was felt to be doing ok. She has seen pulmonary as well. Last seen by me back in February and she was doing ok - not smoking. Needing more activity.   Comes in today. Herewith her son today. She notes she has had more issues with the pollen/upper respiratory symptoms. Just finished another round of prednisone and antibiotics. She has shortness of breath. Not smoking since November. She notes that last night she woke up with a burning band like sensation in her chest - midsternal - it has persisted to today - it is present right now. She has a sensation of feeling clammy - but is not. Remains short of breath. Walking in here did not seem to bother her. She has lost some weight - she does not know how she has done this.   Past  Medical History:  Diagnosis Date  . Asthma   . Chronic anticoagulation   . COPD (chronic obstructive pulmonary disease) ([place])   . Diabetes mellitus without complication ([place])    TYPE 2  . DJD (degenerative joint disease)   . GERD (gastroesophageal reflux disease)   . History of home oxygen therapy    oxygen 2 liters per nasal cannula at hs  . HTN (hypertension)   . Hyperlipidemia   . Mobitz (type) II atrioventricular block   . Obesity   . Pacemaker    Implanted  by Dr [name] (MDT) 10/06/10  . PAF (paroxysmal atrial fibrillation) ([place])   . Pancreatitis 2010 OR 2011   pt thought this had been ruled out  . Sleep apnea    CPA SETTING OF 14    Past Surgical History:  Procedure Laterality Date  . CATARACT EXTRACTION W/PHACO  11/02/2011   Procedure: CATARACT EXTRACTION PHACO AND INTRAOCULAR LENS PLACEMENT (IOC);  Surgeon: Williams Che;  Location: [facility];  Service: Ophthalmology;  Laterality: Right;  CDE=7.33  . CATARACT EXTRACTION W/PHACO  12/07/2011   Procedure: CATARACT EXTRACTION PHACO AND INTRAOCULAR LENS PLACEMENT (IOC);  Surgeon: Williams Che, MD;  Location: [facility];  Service: Ophthalmology;  Laterality: Left;  CDE 3.61  . COLONOSCOPY WITH PROPOFOL N/A 08/09/2014   Procedure: COLONOSCOPY WITH PROPOFOL;  Surgeon: Mar Daring  Collene Mares, MD;  Location: [facility];  Service: Endoscopy;  Laterality: N/A;  . CYSTOCELE REPAIR    . ESOPHAGOGASTRODUODENOSCOPY (EGD) WITH PROPOFOL N/A 08/09/2014   Procedure: ESOPHAGOGASTRODUODENOSCOPY (EGD) WITH PROPOFOL;  Surgeon: Juanita Craver, MD;  Location: [facility];  Service: Endoscopy;  Laterality: N/A;  . EYE SURGERY  11/01/2012   BOTH EYES CATARACTS  . INSERT / REPLACE / REMOVE PACEMAKER  10/06/10   MDT  implanted by Dr [name]  . KNEE ARTHROSCOPY     both  . OVARY SURGERY     removal  . REPAIR RECTOCELE    . SIMPLE MASTECTOMY WITH AXILLARY SENTINEL NODE BIOPSY Left 01/09/2015   Procedure: Irrigation and Drainage Abcess left Axilla;  Surgeon:  Jackolyn Confer, MD;  Location: [facility];  Service: General;  Laterality: Left;  . TONSILLECTOMY  AGE 16  . TOTAL ABDOMINAL HYSTERECTOMY  1971     Medications: Current Meds  Medication Sig  . albuterol (PROAIR HFA) 108 (90 BASE) MCG/ACT inhaler Inhale 2 puffs into the lungs every 4 (four) hours as needed for wheezing. Inhale 2 puffs into the lungs every 4 (four) hours as needed.  Marland Kitchen amLODipine (NORVASC) 5 MG tablet Take 1 tablet (5 mg total) by mouth daily.  Marland Kitchen apixaban (ELIQUIS) 5 MG TABS tablet Take 1 tablet (5 mg total) by mouth 2 (two) times daily.  Marland Kitchen atorvastatin (LIPITOR) 40 MG tablet Take 40 mg by mouth daily.   . budesonide-formoterol (SYMBICORT) 160-4.5 MCG/ACT inhaler Inhale 2 puffs into the lungs 2 (two) times daily.  . fenofibrate 160 MG tablet Take 160 mg by mouth every evening.  . furosemide (LASIX) 40 MG tablet Take 1 tablet (40 mg total) by mouth daily.  Marland Kitchen glipiZIDE (GLUCOTROL XL) 10 MG 24 hr tablet Take 10 mg by mouth 2 (two) times daily.   Marland Kitchen ipratropium-albuterol (DUONEB) 0.5-2.5 (3) MG/3ML SOLN Take 3 mLs by nebulization 2 (two) times daily.  Marland Kitchen LORazepam (ATIVAN) 0.5 MG tablet Take 1 tablet by mouth every 6 (six) hours as needed for anxiety.   . metFORMIN (GLUCOPHAGE) 1000 MG tablet Take 1,000 mg by mouth 2 (two) times daily with a meal.  . methocarbamol (ROBAXIN) 500 MG tablet Take 1 tablet by mouth every 6 (six) hours as needed for pain.  . metoprolol succinate (TOPROL-XL) 100 MG 24 hr tablet TAKE 1 TABLET BY MOUTH ONCE DAILY  . nitroGLYCERIN (NITROSTAT) 0.4 MG SL tablet Place 1 tablet (0.4 mg total) under the tongue every 5 (five) minutes as needed for chest pain.  Marland Kitchen oxybutynin (DITROPAN-XL) 10 MG 24 hr tablet Take 10 mg by mouth daily.   . Probiotic Product (ALIGN PO) Take 1 capsule by mouth daily.   Marland Kitchen spironolactone (ALDACTONE) 25 MG tablet Take 1 tablet (25 mg total) by mouth daily.  Marland Kitchen telmisartan (MICARDIS) 80 MG tablet   . traMADol (ULTRAM) 50 MG tablet Take 1 tablet  by mouth every 6 (six) hours as needed for pain.     Allergies: Allergies  Allergen Reactions  . Bee Venom Swelling  . Latex Swelling    LATEX CATHETERS    Social History: The patient  reports that she quit smoking about 5 months ago. Her smoking use included cigarettes. She has a 50.00 pack-year smoking history. She quit smokeless tobacco use about 6 years ago. She reports that she drinks about 0.6 oz of alcohol per week. She reports that she does not use drugs.   Family History: The patient's family history includes Congestive Heart Failure in  her father and mother; Diabetes in her brother; Osteoarthritis in her sister; Prostate cancer in her brother.   Review of Systems: Please see the history of present illness.   Otherwise, the review of systems is positive for none.   All other systems are reviewed and negative.   Physical Exam: VS:  BP 120/74 (BP Location: Left Arm, Patient Position: Sitting, Cuff Size: Large)   Pulse 80   Ht 5\' 7"  (1.702 m)   Wt 243 lb 1.9 oz (110.3 kg)   SpO2 97% Comment: at rest  BMI 38.08 kg/m  .  BMI Body mass index is 38.08 kg/m.  Wt Readings from Last 3 Encounters:  03/23/18 243 lb 1.9 oz (110.3 kg)  12/22/17 251 lb 12.8 oz (114.2 kg)  11/01/17 254 lb (115.2 kg)    General: Pleasant. Obese. Alert and in no acute distress.  Weight is down 8 pounds. She does endorse chest discomfort.  HEENT: Normal.  Neck: Supple, no JVD, carotid bruits, or masses noted.  Cardiac: Regular rate and rhythm. Presumed paced. No murmurs, rubs, or gallops. No edema.  Respiratory:  Lungs are clear to auscultation bilaterally with normal work of breathing. She has a congested cough as well.  GI: Soft and nontender.  MS: No deformity or atrophy. Gait and ROM intact.  Skin: Warm and dry. Color is normal.  Neuro:  Strength and sensation are intact and no gross focal deficits noted.  Psych: Alert, appropriate and with normal affect.   LABORATORY DATA:  EKG:  EKG is  ordered today. This shows AV pacing - reviewed with Dr. Burt Knack (DOD)   Lab Results  Component Value Date   WBC 6.2 10/20/2017   HGB 13.3 10/20/2017   HCT 40.0 10/20/2017   PLT 229 10/20/2017   GLUCOSE 264 (H) 12/22/2017   ALT 22 09/27/2017   AST 22 09/27/2017   NA 140 12/22/2017   K 4.1 12/22/2017   CL 98 12/22/2017   CREATININE 1.28 (H) 12/22/2017   BUN 26 12/22/2017   CO2 28 12/22/2017   TSH 2.430 05/03/2017   INR 1.13 01/08/2015     BNP (last 3 results) No results for input(s): BNP in the last 8760 hours.  ProBNP (last 3 results) Recent Labs    10/20/17 1033  PROBNP 257     Other Studies Reviewed Today:  Echo Study Conclusions December 2018  - Left ventricle: The cavity size was normal. There was moderate concentric hypertrophy. Systolic function was normal. The estimated ejection fraction was in the range of 60% to 65%. Wall motion was normal; there were no regional wall motion abnormalities. Features are consistent with a pseudonormal left ventricular filling pattern, with concomitant abnormal relaxation and increased filling pressure (grade 2 diastolic dysfunction). - Left atrium: The atrium was mildly dilated. - Atrial septum: The septum bowed from left to right, consistent with increased left atrial pressure. No defect or patent foramen ovale was identified. - Pulmonary arteries: Systolic pressure was mildly increased. PA peak pressure: 37 mm Hg (S).    MyoviewStudy Highlights6/2018   Nuclear stress EF: 65%.  Probable normal perfusion and soft tissue attenuation  This is a low risk study.   EchoStudy Conclusions2015  - Left ventricle: The cavity size was normal. Systolic function was normal. The estimated ejection fraction was in the range of 60% to 65%. Wall motion was normal; there were no regional wall motion abnormalities. Doppler parameters are consistent with abnormal left ventricular relaxation  (grade 1 diastolic dysfunction). - Aortic  valve: Trileaflet; normal thickness leaflets. There was no regurgitation. - Aortic root: The aortic root was normal in size. - Mitral valve: There was mild regurgitation. - Left atrium: The atrium was mildly dilated. - Right ventricle: Systolic function was normal. - Right atrium: The atrium was normal in size. - Pulmonic valve: There was no regurgitation. - Pulmonary arteries: Systolic pressure was within the normal range. - Pericardium, extracardiac: There was no pericardial effusion.  Impressions:  - Normal biventricular size and function. Impaired relaxation. Mild mitral regurgitation.  Assessment/Plan:  1. Chest pain - concerning for unstable angina - given sl NTG x 1 here in the office - she has some relief - BP down to 100/60 by me. Given 4 baby aspirin here as well. Subsequently seen with Dr. Burt Knack - sending to [facility] by EMS for admission - plan for cardiac cath tomorrow in light of symptoms and multiple CV risk factors. She took her last dose of Eliquis last night. Will hold today.   2. Prior admission for COPD exacerbation/hypoxia - improved - has stopped smoking. Following with pulmonary.   3. Diastolic HF - not addressed today.   4. Underlying PPM for Mobitz type 2 - followed by EP  5. HTN - BP ok on current regimen - no changes made today.   6. Obesity - she is down 8 pounds - unclear as to how.    7. PAF - On Eliquis - no adverse reactions noted.   8. Tobacco abuse - has not smoked since November. She is Advertising account executive.   Current medicines are reviewed with the patient today.  The patient does not have concerns regarding medicines other than what has been noted above.  The following changes have been made:  See above.  Labs/ tests ordered today include:    Orders Placed This Encounter  Procedures  . EKG 12-Lead     Disposition:   Seen with Dr. Burt Knack. Transfer by EMS to [facility] for unstable angina.  Plan for cardiac cath tomorrow.   Patient is agreeable to this plan and will call if any problems develop in the interim.   SignedTruitt Merle, NP  03/23/2018 8:25 AM  [facility] [address] [place], [place]  [address] Phone: [phone] Fax: [phone]       Addendum: Verified by phone this afternoon her code status - she wishes to remain a DNR.  Her full code order was discontinued and DNR was ordered per patient's wishes.   [name], RN, [facility] [address] [place], [place]  [address] [phone]

## 2018-03-23 ENCOUNTER — Encounter: Payer: Self-pay | Admitting: Nurse Practitioner

## 2018-03-23 ENCOUNTER — Emergency Department (HOSPITAL_COMMUNITY): Payer: Medicare HMO

## 2018-03-23 ENCOUNTER — Observation Stay (HOSPITAL_COMMUNITY)
Admission: EM | Admit: 2018-03-23 | Discharge: 2018-03-25 | Disposition: A | Discharge status: Home / Self Care | Payer: Medicare HMO | Attending: Cardiovascular Disease | Admitting: Cardiovascular Disease

## 2018-03-23 ENCOUNTER — Encounter (HOSPITAL_COMMUNITY): Payer: Self-pay | Admitting: Emergency Medicine

## 2018-03-23 ENCOUNTER — Ambulatory Visit: Payer: Medicare HMO | Admitting: Nurse Practitioner

## 2018-03-23 VITALS — BP 120/74 | HR 80 | Ht 67.0 in | Wt 243.1 lb

## 2018-03-23 DIAGNOSIS — E669 Obesity, unspecified: Secondary | ICD-10-CM | POA: Insufficient documentation

## 2018-03-23 DIAGNOSIS — Z79899 Other long term (current) drug therapy: Secondary | ICD-10-CM | POA: Diagnosis not present

## 2018-03-23 DIAGNOSIS — M1990 Unspecified osteoarthritis, unspecified site: Secondary | ICD-10-CM | POA: Insufficient documentation

## 2018-03-23 DIAGNOSIS — Z950 Presence of cardiac pacemaker: Secondary | ICD-10-CM

## 2018-03-23 DIAGNOSIS — J449 Chronic obstructive pulmonary disease, unspecified: Secondary | ICD-10-CM | POA: Insufficient documentation

## 2018-03-23 DIAGNOSIS — I10 Essential (primary) hypertension: Secondary | ICD-10-CM | POA: Diagnosis not present

## 2018-03-23 DIAGNOSIS — Z7951 Long term (current) use of inhaled steroids: Secondary | ICD-10-CM | POA: Diagnosis not present

## 2018-03-23 DIAGNOSIS — K219 Gastro-esophageal reflux disease without esophagitis: Secondary | ICD-10-CM | POA: Insufficient documentation

## 2018-03-23 DIAGNOSIS — Z87891 Personal history of nicotine dependence: Secondary | ICD-10-CM | POA: Diagnosis not present

## 2018-03-23 DIAGNOSIS — I200 Unstable angina: Secondary | ICD-10-CM | POA: Diagnosis present

## 2018-03-23 DIAGNOSIS — I480 Paroxysmal atrial fibrillation: Secondary | ICD-10-CM | POA: Diagnosis not present

## 2018-03-23 DIAGNOSIS — I441 Atrioventricular block, second degree: Secondary | ICD-10-CM

## 2018-03-23 DIAGNOSIS — Z7901 Long term (current) use of anticoagulants: Secondary | ICD-10-CM | POA: Diagnosis not present

## 2018-03-23 DIAGNOSIS — Z7984 Long term (current) use of oral hypoglycemic drugs: Secondary | ICD-10-CM | POA: Insufficient documentation

## 2018-03-23 DIAGNOSIS — Z9981 Dependence on supplemental oxygen: Secondary | ICD-10-CM | POA: Diagnosis not present

## 2018-03-23 DIAGNOSIS — I25110 Atherosclerotic heart disease of native coronary artery with unstable angina pectoris: Principal | ICD-10-CM | POA: Insufficient documentation

## 2018-03-23 DIAGNOSIS — G4733 Obstructive sleep apnea (adult) (pediatric): Secondary | ICD-10-CM | POA: Diagnosis not present

## 2018-03-23 DIAGNOSIS — E119 Type 2 diabetes mellitus without complications: Secondary | ICD-10-CM

## 2018-03-23 DIAGNOSIS — I5032 Chronic diastolic (congestive) heart failure: Secondary | ICD-10-CM | POA: Insufficient documentation

## 2018-03-23 DIAGNOSIS — I2510 Atherosclerotic heart disease of native coronary artery without angina pectoris: Secondary | ICD-10-CM

## 2018-03-23 DIAGNOSIS — R079 Chest pain, unspecified: Secondary | ICD-10-CM

## 2018-03-23 DIAGNOSIS — I110 Hypertensive heart disease with heart failure: Secondary | ICD-10-CM | POA: Diagnosis not present

## 2018-03-23 DIAGNOSIS — E785 Hyperlipidemia, unspecified: Secondary | ICD-10-CM | POA: Diagnosis not present

## 2018-03-23 DIAGNOSIS — I2583 Coronary atherosclerosis due to lipid rich plaque: Secondary | ICD-10-CM

## 2018-03-23 DIAGNOSIS — I5033 Acute on chronic diastolic (congestive) heart failure: Secondary | ICD-10-CM | POA: Diagnosis present

## 2018-03-23 DIAGNOSIS — I499 Cardiac arrhythmia, unspecified: Secondary | ICD-10-CM | POA: Diagnosis present

## 2018-03-23 LAB — CBC
HCT: 36.3 % (ref 36.0–46.0)
Hemoglobin: 11.5 g/dL — ABNORMAL LOW (ref 12.0–15.0)
MCH: 28.5 pg (ref 26.0–34.0)
MCHC: 31.7 g/dL (ref 30.0–36.0)
MCV: 89.9 fL (ref 78.0–100.0)
Platelets: 294 10*3/uL (ref 150–400)
RBC: 4.04 MIL/uL (ref 3.87–5.11)
RDW: 14 % (ref 11.5–15.5)
WBC: 8 10*3/uL (ref 4.0–10.5)

## 2018-03-23 LAB — MAGNESIUM: Magnesium: 1.4 mg/dL — ABNORMAL LOW (ref 1.7–2.4)

## 2018-03-23 LAB — BRAIN NATRIURETIC PEPTIDE: B Natriuretic Peptide: 127.4 pg/mL — ABNORMAL HIGH (ref 0.0–100.0)

## 2018-03-23 LAB — HEPARIN LEVEL (UNFRACTIONATED): Heparin Unfractionated: 1.3 IU/mL — ABNORMAL HIGH (ref 0.30–0.70)

## 2018-03-23 LAB — BASIC METABOLIC PANEL
Anion gap: 11 (ref 5–15)
BUN: 14 mg/dL (ref 6–20)
CO2: 25 mmol/L (ref 22–32)
Calcium: 8.6 mg/dL — ABNORMAL LOW (ref 8.9–10.3)
Chloride: 104 mmol/L (ref 101–111)
Creatinine, Ser: 1.22 mg/dL — ABNORMAL HIGH (ref 0.44–1.00)
GFR calc Af Amer: 49 mL/min — ABNORMAL LOW (ref >60)
GFR calc non Af Amer: 42 mL/min — ABNORMAL LOW (ref >60)
Glucose, Bld: 172 mg/dL — ABNORMAL HIGH (ref 65–99)
Potassium: 3.6 mmol/L (ref 3.5–5.1)
Sodium: 140 mmol/L (ref 135–145)

## 2018-03-23 LAB — APTT
aPTT: 29 seconds (ref 24–36)
aPTT: 46 seconds — ABNORMAL HIGH (ref 24–36)

## 2018-03-23 LAB — CBG MONITORING, ED
Glucose-Capillary: 113 mg/dL — ABNORMAL HIGH (ref 65–99)
Glucose-Capillary: 96 mg/dL (ref 65–99)

## 2018-03-23 LAB — TROPONIN I: Troponin I: <0.03 ng/mL (ref <0.03)

## 2018-03-23 LAB — PROTIME-INR
INR: 1.1
Prothrombin Time: 14.1 seconds (ref 11.4–15.2)

## 2018-03-23 LAB — HEMOGLOBIN A1C
Hgb A1c MFr Bld: 7.5 % — ABNORMAL HIGH (ref 4.8–5.6)
Mean Plasma Glucose: 168.55 mg/dL

## 2018-03-23 LAB — GLUCOSE, CAPILLARY: Glucose-Capillary: 107 mg/dL — ABNORMAL HIGH (ref 65–99)

## 2018-03-23 LAB — I-STAT TROPONIN, ED: Troponin i, poc: 0.02 ng/mL (ref 0.00–0.08)

## 2018-03-23 LAB — TSH: TSH: 1.508 u[IU]/mL (ref 0.350–4.500)

## 2018-03-23 MED ORDER — ONDANSETRON HCL 4 MG/2ML IJ SOLN: 4.0000 mg | Freq: Four times a day (QID) | INTRAMUSCULAR | Status: DC | PRN

## 2018-03-23 MED ORDER — ASPIRIN 81 MG PO CHEW: 81.0000 mg | CHEWABLE_TABLET | ORAL | Status: DC

## 2018-03-23 MED ORDER — HEPARIN BOLUS VIA INFUSION
4000.0000 [IU] | Freq: Once | INTRAVENOUS | Status: DC
Start: 2018-03-23 — End: 2018-03-23
  Filled 2018-03-23: qty 4000

## 2018-03-23 MED ORDER — IPRATROPIUM-ALBUTEROL 0.5-2.5 (3) MG/3ML IN SOLN
3.0000 mL | Freq: Two times a day (BID) | RESPIRATORY_TRACT | Status: DC
  Administered 2018-03-24: 3 mL via RESPIRATORY_TRACT
  Filled 2018-03-23: qty 3

## 2018-03-23 MED ORDER — NITROGLYCERIN 0.4 MG SL SUBL: 0.4000 mg | SUBLINGUAL_TABLET | SUBLINGUAL | Status: DC | PRN

## 2018-03-23 MED ORDER — GLIPIZIDE ER 10 MG PO TB24
10.0000 mg | ORAL_TABLET | Freq: Two times a day (BID) | ORAL | Status: DC
  Administered 2018-03-23 (×2): 10 mg via ORAL
  Filled 2018-03-23 (×3): qty 1

## 2018-03-23 MED ORDER — METOPROLOL SUCCINATE ER 100 MG PO TB24
100.0000 mg | ORAL_TABLET | Freq: Every day | ORAL | Status: DC
  Administered 2018-03-23 – 2018-03-24 (×2): 100 mg via ORAL
  Filled 2018-03-23 (×2): qty 1

## 2018-03-23 MED ORDER — SODIUM CHLORIDE 0.9% FLUSH: 3.0000 mL | INTRAVENOUS | Status: DC | PRN

## 2018-03-23 MED ORDER — SODIUM CHLORIDE 0.9 % IV SOLN: 250.0000 mL | INTRAVENOUS | Status: DC | PRN

## 2018-03-23 MED ORDER — SODIUM CHLORIDE 0.9 % IV SOLN
INTRAVENOUS | Status: DC
  Administered 2018-03-23: 12:00:00 via INTRAVENOUS

## 2018-03-23 MED ORDER — HEPARIN (PORCINE) IN NACL 100-0.45 UNIT/ML-% IJ SOLN
1250.0000 [IU]/h | INTRAMUSCULAR | Status: DC
  Administered 2018-03-23: 1100 [IU]/h via INTRAVENOUS
  Administered 2018-03-24: 1250 [IU]/h via INTRAVENOUS
  Filled 2018-03-23 (×2): qty 250

## 2018-03-23 MED ORDER — OXYBUTYNIN CHLORIDE ER 10 MG PO TB24
10.0000 mg | ORAL_TABLET | Freq: Every day | ORAL | Status: DC
  Administered 2018-03-23: 10 mg via ORAL
  Filled 2018-03-23 (×2): qty 1

## 2018-03-23 MED ORDER — TRAMADOL HCL 50 MG PO TABS: 50.0000 mg | ORAL_TABLET | Freq: Four times a day (QID) | ORAL | Status: DC | PRN

## 2018-03-23 MED ORDER — LORAZEPAM 0.5 MG PO TABS: 0.5000 mg | ORAL_TABLET | Freq: Four times a day (QID) | ORAL | Status: DC | PRN

## 2018-03-23 MED ORDER — SPIRONOLACTONE 25 MG PO TABS
25.0000 mg | ORAL_TABLET | Freq: Every day | ORAL | Status: DC
  Administered 2018-03-23: 25 mg via ORAL
  Filled 2018-03-23: qty 1

## 2018-03-23 MED ORDER — NITROGLYCERIN IN D5W 200-5 MCG/ML-% IV SOLN
0.0000 ug/min | INTRAVENOUS | Status: DC
  Administered 2018-03-23: 5 ug/min via INTRAVENOUS
  Filled 2018-03-23: qty 250

## 2018-03-23 MED ORDER — SODIUM CHLORIDE 0.9% FLUSH
3.0000 mL | Freq: Two times a day (BID) | INTRAVENOUS | Status: DC
  Administered 2018-03-23 – 2018-03-24 (×2): 3 mL via INTRAVENOUS

## 2018-03-23 MED ORDER — ACETAMINOPHEN 325 MG PO TABS: 650.0000 mg | ORAL_TABLET | ORAL | Status: DC | PRN

## 2018-03-23 MED ORDER — ATORVASTATIN CALCIUM 40 MG PO TABS
40.0000 mg | ORAL_TABLET | Freq: Every day | ORAL | Status: DC
  Administered 2018-03-23: 40 mg via ORAL
  Filled 2018-03-23: qty 1

## 2018-03-23 MED ORDER — ASPIRIN EC 81 MG PO TBEC
81.0000 mg | DELAYED_RELEASE_TABLET | Freq: Every day | ORAL | Status: DC
  Administered 2018-03-24: 81 mg via ORAL
  Filled 2018-03-23: qty 1

## 2018-03-23 MED ORDER — FENOFIBRATE 160 MG PO TABS
160.0000 mg | ORAL_TABLET | Freq: Every day | ORAL | Status: DC
  Administered 2018-03-23 – 2018-03-24 (×2): 160 mg via ORAL
  Filled 2018-03-23 (×2): qty 1

## 2018-03-23 MED ORDER — FUROSEMIDE 40 MG PO TABS
40.0000 mg | ORAL_TABLET | Freq: Every day | ORAL | Status: DC
  Administered 2018-03-23: 40 mg via ORAL
  Filled 2018-03-23: qty 2

## 2018-03-23 MED ORDER — LOSARTAN POTASSIUM 50 MG PO TABS
100.0000 mg | ORAL_TABLET | Freq: Every day | ORAL | Status: DC
  Administered 2018-03-23 – 2018-03-24 (×2): 100 mg via ORAL
  Filled 2018-03-23 (×2): qty 2

## 2018-03-23 MED ORDER — SODIUM CHLORIDE 0.9% FLUSH: 3.0000 mL | Freq: Two times a day (BID) | INTRAVENOUS | Status: DC

## 2018-03-23 NOTE — ED Notes (Signed)
Discussed procedure and informed consent with patient.  Patient states benefits and risks have not been reviewed, full procedure review not done.  Will save informed consent until someone from cards team can review

## 2018-03-23 NOTE — H&P (Signed)
Office Visit   03/23/2018 [facility]    [place], Marlane Hatcher, NP    Cardiology   Mobitz type II atrioventricular block +5 more    Dx   Irregular Heart Beat , Shortness of Breath , Hypertension   ; Referred by [name], FNP    Reason for Visit     Additional Documentation   Vitals:   BP 120/74 (BP Location: Left Arm, Patient Position: Sitting, Cuff Size: Large)    Pulse 80    Ht 5\' 7"  (1.702 m)    Wt 243 lb 1.9 oz (110.3 kg)    SpO2 97%     BMI 38.08 kg/m    BSA 2.28 m        More Vitals    Flowsheets:   Anthropometrics,    MEWS Score      Encounter Info:   Billing Info,    History,    Allergies,    Detailed Report       All Notes    Procedures by [name], NP at 03/23/2018 8:00 AM   Author: [name], NP Author Type: Nurse Practitioner Filed: 03/23/2018 8:53 AM  Note Status: Signed Cosign: Cosign Not Required Encounter Date: 03/23/2018  Editor: Sallee Provencal L  Procedure Orders:  1. EKG 12-Lead [[id number]] ordered by [name], NP at 03/23/18 [phone]           Scan on 03/23/2018 8:53 AM by Myles Rosenthal: Ekg - [facility] Scan on 03/23/2018 8:53 AM by Myles Rosenthal: Ekg - [facility]     Progress Notes by [name], NP at 03/23/2018 8:00 AM   Author: [name], NP Author Type: Nurse Practitioner Filed: 03/23/2018 8:33 AM  Note Status: Signed Cosign: Cosign Not Required Encounter Date: 03/23/2018  Editor: [name], NP (Nurse Practitioner)  Expand All Collapse All       CARDIOLOGY OFFICE NOTE  Date:  03/23/2018    Brandi Hunter Date of Birth: 1940-12-24 Medical Record #[id number]  PCP:  [name], [place]            Cardiologist:  [name]        Chief Complaint  Patient presents with  . Irregular Heart Beat  . Shortness of Breath  . Hypertension    Follow up visit - seen for Dr. [name]    History of Present  Illness: Brandi Hunter is a 77 y.o. female who presents today for a follow up visit. Seen for Dr. [name].   She has had AV block with PPM dating back to 2011, HTN, diabetes, obesity, HLD, DJD, asthma, COPD with ongoing tobacco abuse, OSA and depression.Low riskMyoview in June of 2018. Normal EF per echo in June of 2015. She has had PAF - now on anticoagulation with Eliquis.   Seen by me back inJune of 2018 - was having some chest tightness - Myoview updated and this was felt to be low risk. She has continued to smoke.Lipids followed by PCP.  Admittedin Novemberof 2018 with COPD exacerbation/hypoxia. Treated with antibiotics and steroid therapy. Strongly counseled in smoking cessation.I then saw her in December - she was improving. She saw Dr. [name] in December as well - he made some med changes but overall she was felt to be doing ok. She has seen pulmonary as well.Last seen by me back in February and she was doing ok - not smoking. Needing more activity.  Comes in today. Herewith her son today. She notes she has had more issues with the pollen/upper respiratory symptoms. Just finished another round of prednisone and antibiotics. She has shortness of breath. Not smoking since November. She notes that last night she woke up with a burning band like sensation in her chest - midsternal - it has persisted to today - it is present right now. She has a sensation of feeling clammy - but is not. Remains short of breath. Walking in here did not seem to bother her. She has lost some weight - she does not know how she has done this.       Past Medical History:  Diagnosis Date  . Asthma   . Chronic anticoagulation   . COPD (chronic obstructive pulmonary disease) ([place])   . Diabetes mellitus without complication ([place])    TYPE 2  . DJD (degenerative joint disease)   . GERD (gastroesophageal reflux disease)   . History of home oxygen therapy    oxygen 2 liters per nasal cannula at  hs  . HTN (hypertension)   . Hyperlipidemia   . Mobitz (type) II atrioventricular block   . Obesity   . Pacemaker    Implanted  by Dr [name] (MDT) 10/06/10  . PAF (paroxysmal atrial fibrillation) ([place])   . Pancreatitis 2010 OR 2011   pt thought this had been ruled out  . Sleep apnea    CPA SETTING OF 14         Past Surgical History:  Procedure Laterality Date  . CATARACT EXTRACTION W/PHACO  11/02/2011   Procedure: CATARACT EXTRACTION PHACO AND INTRAOCULAR LENS PLACEMENT (IOC);  Surgeon: Williams Che;  Location: [facility];  Service: Ophthalmology;  Laterality: Right;  CDE=7.33  . CATARACT EXTRACTION W/PHACO  12/07/2011   Procedure: CATARACT EXTRACTION PHACO AND INTRAOCULAR LENS PLACEMENT (IOC);  Surgeon: Williams Che, MD;  Location: [facility];  Service: Ophthalmology;  Laterality: Left;  CDE 3.61  . COLONOSCOPY WITH PROPOFOL N/A 08/09/2014   Procedure: COLONOSCOPY WITH PROPOFOL;  Surgeon: Juanita Craver, MD;  Location: [facility];  Service: Endoscopy;  Laterality: N/A;  . CYSTOCELE REPAIR    . ESOPHAGOGASTRODUODENOSCOPY (EGD) WITH PROPOFOL N/A 08/09/2014   Procedure: ESOPHAGOGASTRODUODENOSCOPY (EGD) WITH PROPOFOL;  Surgeon: Juanita Craver, MD;  Location: [facility];  Service: Endoscopy;  Laterality: N/A;  . EYE SURGERY  11/01/2012   BOTH EYES CATARACTS  . INSERT / REPLACE / REMOVE PACEMAKER  10/06/10   MDT  implanted by Dr [name]  . KNEE ARTHROSCOPY     both  . OVARY SURGERY     removal  . REPAIR RECTOCELE    . SIMPLE MASTECTOMY WITH AXILLARY SENTINEL NODE BIOPSY Left 01/09/2015   Procedure: Irrigation and Drainage Abcess left Axilla;  Surgeon: Jackolyn Confer, MD;  Location: [facility];  Service: General;  Laterality: Left;  . TONSILLECTOMY  AGE 6  . TOTAL ABDOMINAL HYSTERECTOMY  1971     Medications: ActiveMedications      Current Meds  Medication Sig  . albuterol (PROAIR HFA) 108 (90 BASE) MCG/ACT inhaler Inhale 2 puffs into the lungs  every 4 (four) hours as needed for wheezing. Inhale 2 puffs into the lungs every 4 (four) hours as needed.  Marland Kitchen amLODipine (NORVASC) 5 MG tablet Take 1 tablet (5 mg total) by mouth daily.  Marland Kitchen apixaban (ELIQUIS) 5 MG TABS tablet Take 1 tablet (5 mg total) by mouth 2 (two) times daily.  Marland Kitchen atorvastatin (LIPITOR) 40 MG tablet Take 40 mg  by mouth daily.   . budesonide-formoterol (SYMBICORT) 160-4.5 MCG/ACT inhaler Inhale 2 puffs into the lungs 2 (two) times daily.  . fenofibrate 160 MG tablet Take 160 mg by mouth every evening.  . furosemide (LASIX) 40 MG tablet Take 1 tablet (40 mg total) by mouth daily.  Marland Kitchen glipiZIDE (GLUCOTROL XL) 10 MG 24 hr tablet Take 10 mg by mouth 2 (two) times daily.   Marland Kitchen ipratropium-albuterol (DUONEB) 0.5-2.5 (3) MG/3ML SOLN Take 3 mLs by nebulization 2 (two) times daily.  Marland Kitchen LORazepam (ATIVAN) 0.5 MG tablet Take 1 tablet by mouth every 6 (six) hours as needed for anxiety.   . metFORMIN (GLUCOPHAGE) 1000 MG tablet Take 1,000 mg by mouth 2 (two) times daily with a meal.  . methocarbamol (ROBAXIN) 500 MG tablet Take 1 tablet by mouth every 6 (six) hours as needed for pain.  . metoprolol succinate (TOPROL-XL) 100 MG 24 hr tablet TAKE 1 TABLET BY MOUTH ONCE DAILY  . nitroGLYCERIN (NITROSTAT) 0.4 MG SL tablet Place 1 tablet (0.4 mg total) under the tongue every 5 (five) minutes as needed for chest pain.  Marland Kitchen oxybutynin (DITROPAN-XL) 10 MG 24 hr tablet Take 10 mg by mouth daily.   . Probiotic Product (ALIGN PO) Take 1 capsule by mouth daily.   Marland Kitchen spironolactone (ALDACTONE) 25 MG tablet Take 1 tablet (25 mg total) by mouth daily.  Marland Kitchen telmisartan (MICARDIS) 80 MG tablet   . traMADol (ULTRAM) 50 MG tablet Take 1 tablet by mouth every 6 (six) hours as needed for pain.       Allergies:      Allergies  Allergen Reactions  . Bee Venom Swelling  . Latex Swelling    LATEX CATHETERS    Social History: The patient  reports that she quit smoking about 5 months ago. Her smoking use  included cigarettes. She has a 50.00 pack-year smoking history. She quit smokeless tobacco use about 6 years ago. She reports that she drinks about 0.6 oz of alcohol per week. She reports that she does not use drugs.   Family History: The patient's family history includes Congestive Heart Failure in her father and mother; Diabetes in her brother; Osteoarthritis in her sister; Prostate cancer in her brother.   Review of Systems: Please see the history of present illness.   Otherwise, the review of systems is positive for none.   All other systems are reviewed and negative.   Physical Exam: VS:  BP 120/74 (BP Location: Left Arm, Patient Position: Sitting, Cuff Size: Large)   Pulse 80   Ht 5\' 7"  (1.702 m)   Wt 243 lb 1.9 oz (110.3 kg)   SpO2 97% Comment: at rest  BMI 38.08 kg/m  .  BMI Body mass index is 38.08 kg/m.     Wt Readings from Last 3 Encounters:  03/23/18 243 lb 1.9 oz (110.3 kg)  12/22/17 251 lb 12.8 oz (114.2 kg)  11/01/17 254 lb (115.2 kg)    General: Pleasant. Obese. Alert and in no acute distress.  Weight is down 8 pounds. She does endorse chest discomfort.  HEENT: Normal.  Neck: Supple, no JVD, carotid bruits, or masses noted.  Cardiac: Regular rate and rhythm. Presumed paced. No murmurs, rubs, or gallops. No edema.  Respiratory:  Lungs are clear to auscultation bilaterally with normal work of breathing. She has a congested cough as well.  GI: Soft and nontender.  MS: No deformity or atrophy. Gait and ROM intact.  Skin: Warm and dry. Color is normal.  Neuro:  Strength and sensation are intact and no gross focal deficits noted.  Psych: Alert, appropriate and with normal affect.   LABORATORY DATA:  EKG:  EKG is ordered today. This shows AV pacing - reviewed with Dr. Burt Knack (DOD)   RecentLabs       Lab Results  Component Value Date   WBC 6.2 10/20/2017   HGB 13.3 10/20/2017   HCT 40.0 10/20/2017   PLT 229 10/20/2017   GLUCOSE 264 (H)  12/22/2017   ALT 22 09/27/2017   AST 22 09/27/2017   NA 140 12/22/2017   K 4.1 12/22/2017   CL 98 12/22/2017   CREATININE 1.28 (H) 12/22/2017   BUN 26 12/22/2017   CO2 28 12/22/2017   TSH 2.430 05/03/2017   INR 1.13 01/08/2015       BNP (last 3 results) RecentLabs(withinlast365days)  No results for input(s): BNP in the last 8760 hours.    ProBNP (last 3 results) RecentLabs(withinlast365days)     Recent Labs    10/20/17 1033  PROBNP 257       Other Studies Reviewed Today:  EchoStudy ConclusionsDecember 2018  - Left ventricle: The cavity size was normal. There was moderate concentric hypertrophy. Systolic function was normal. The estimated ejection fraction was in the range of 60% to 65%. Wall motion was normal; there were no regional wall motion abnormalities. Features are consistent with a pseudonormal left ventricular filling pattern, with concomitant abnormal relaxation and increased filling pressure (grade 2 diastolic dysfunction). - Left atrium: The atrium was mildly dilated. - Atrial septum: The septum bowed from left to right, consistent with increased left atrial pressure. No defect or patent foramen ovale was identified. - Pulmonary arteries: Systolic pressure was mildly increased. PA peak pressure: 37 mm Hg (S).    MyoviewStudy Highlights6/2018   Nuclear stress EF: 65%.  Probable normal perfusion and soft tissue attenuation  This is a low risk study.   EchoStudy Conclusions2015  - Left ventricle: The cavity size was normal. Systolic function was normal. The estimated ejection fraction was in the range of 60% to 65%. Wall motion was normal; there were no regional wall motion abnormalities. Doppler parameters are consistent with abnormal left ventricular relaxation (grade 1 diastolic dysfunction). - Aortic valve: Trileaflet; normal thickness leaflets. There was  no regurgitation. - Aortic root: The aortic root was normal in size. - Mitral valve: There was mild regurgitation. - Left atrium: The atrium was mildly dilated. - Right ventricle: Systolic function was normal. - Right atrium: The atrium was normal in size. - Pulmonic valve: There was no regurgitation. - Pulmonary arteries: Systolic pressure was within the normal range. - Pericardium, extracardiac: There was no pericardial effusion.  Impressions:  - Normal biventricular size and function. Impaired relaxation. Mild mitral regurgitation.  Assessment/Plan:  1.Chest pain - concerning for unstable angina - given sl NTG x 1 here in the office - she has some relief - BP down to 100/60 by me. Given 4 baby aspirin here as well. Subsequently seen with Dr. Burt Knack - sending to [facility] by EMS for admission - plan for cardiac cath tomorrow in light of symptoms and multiple CV risk factors. She took her last dose of Eliquis last night. Will hold today.   2. Prioradmission for COPD exacerbation/hypoxia - improved - has stopped smoking. Following with pulmonary.  3. Diastolic HF -not addressed today.   4. Underlying PPM for Mobitz type 2 -followed by EP  5. HTN - BP ok on current regimen -no changes made  today.   6. Obesity- she is down 8 pounds - unclear as to how.   7. PAF- On Eliquis - no adverse reactions noted.   8. Tobacco abuse- has not smoked since November. She is Advertising account executive.   Current medicines are reviewed with the patient today.  The patient does not have concerns regarding medicines other than what has been noted above.  The following changes have been made:  See above.  Labs/ tests ordered today include:       Orders Placed This Encounter  Procedures  . EKG 12-Lead     Disposition:   Seen with Dr. Burt Knack. Transfer by EMS to [facility] for unstable angina. Plan for cardiac cath tomorrow.   Patient is agreeable to this plan and will call if any  problems develop in the interim.   SignedTruitt Merle, NP  03/23/2018 8:25 AM  Ord [address] [place], [place]  [address] Phone: [phone] Fax: [phone]              Patient Instructions by [name], NP at 03/23/2018 8:00 AM   Author: [name], NP Author Type: Nurse Practitioner Filed: 03/23/2018 8:23 AM  Note Status: Addendum Cosign: Cosign Not Required Encounter Date: 03/23/2018  Editor: [name], NP (Nurse Practitioner)  Prior Versions: 1. [name], NP (Nurse Practitioner) at 03/23/2018 8:03 AM - Addendum   2. [name], NP (Nurse Practitioner) at 03/23/2018 8:01 AM - Signed    We are admitting you to the hospital today.     Call the [place] office at [phone] if you have any questions, problems or concerns.            Instructions   We are admitting you to the hospital today.     Call the [place] office at [phone] if you have any questions, problems or concerns.           Communications      [facility] Provider CC Chart Rep sent to [name], FNP      Chart Routed to Sherren Mocha, MD and Thompson Grayer, MD     Media   Electronic signature on 03/23/2018 7:54 AM - Signed    Communication Routing History   Recipient Method Sent by Date Sent  [name], [place] Fax [name], NP 03/23/2018  Fax: [phone] Phone: [phone]     Orders Placed      EKG 12-Lead     Medication Changes        (Change in therapy)     Medication List    Visit Diagnoses      Mobitz type II atrioventricular block      Paroxysmal atrial fibrillation ([place])      Essential hypertension      Pacemaker      High risk medication use      Chest pain, unspecified type     Problem List    Level of Service   Level of Service  LOS - NO CHARGE  [NC1]   All Charges for This Encounter   Code  NC1  Description: LOS - NO CHARGE  Service Date: 03/23/2018  Service Provider: [name], NP  Qty: 1  [id number]  Description: PR ELECTROCARDIOGRAM, COMPLETE  Service Date: 03/23/2018  Service Provider: [name], NP  Qty: 1

## 2018-03-23 NOTE — Progress Notes (Signed)
ANTICOAGULATION CONSULT NOTE - Initial Consult  Pharmacy Consult for heparin Indication: chest pain/ACS  Allergies  Allergen Reactions  . Bee Venom Swelling  . Latex Swelling    LATEX CATHETERS    Patient Measurements:  Ideal body weight: 61.6 kg  Heparin Dosing Weight: 87 kg   Vital Signs: Temp: 98.1 F (36.7 C) (05/08 0905) Temp Source: Oral (05/08 0905) BP: 120/74 (05/08 0758) Pulse Rate: 71 (05/08 0905)  Labs: Recent Labs    03/23/18 0907  HGB 11.5*  HCT 36.3  PLT 294  CREATININE 1.22*    Estimated Creatinine Clearance: 51 mL/min (A) (by C-G formula based on SCr of 1.22 mg/dL (H)).   Medical History: Past Medical History:  Diagnosis Date  . Asthma   . Chronic anticoagulation   . COPD (chronic obstructive pulmonary disease) ([place])   . Diabetes mellitus without complication ([place])    TYPE 2  . DJD (degenerative joint disease)   . GERD (gastroesophageal reflux disease)   . History of home oxygen therapy    oxygen 2 liters per nasal cannula at hs  . HTN (hypertension)   . Hyperlipidemia   . Mobitz (type) II atrioventricular block   . Obesity   . Pacemaker    Implanted  by Dr [name] (MDT) 10/06/10  . PAF (paroxysmal atrial fibrillation) ([place])   . Pancreatitis 2010 OR 2011   pt thought this had been ruled out  . Sleep apnea    CPA SETTING OF 14    Medications:   (Not in a hospital admission)  Assessment: Brandi Hunter with shortness of breath and midsternal burning sensation. Pharmacy consulted to start IV heparin for ACS. Initial troponin is negative. Hgb 11.5, Plt wnl  Of note, patient is on Eliquis at home and she states her last dose was around 10 PM last night.   Goal of Therapy:  Heparin level 0.3-0.7 units/ml Monitor platelets by anticoagulation protocol: Yes   Plan:  - Start IV heparin infusion at 1100 units/hr -F/u 8 hr HL/APTT -Monitor daily HL/aptt, CBC and s/s of bleeding -F/u plans for cath  Albertina Parr, PharmD., BCPS Clinical  Pharmacist Clinical phone for 03/23/18 until 3:30pm: [phone] If after 3:30pm, please call main pharmacy at: [phone]

## 2018-03-23 NOTE — ED Notes (Signed)
Ordered pt a lunch tray 

## 2018-03-23 NOTE — Patient Instructions (Addendum)
We are admitting you to the hospital today.   Call the [facility] office at [phone] if you have any questions, problems or concerns.      

## 2018-03-23 NOTE — ED Notes (Signed)
Help get patient undress on the monitor patient is resting with family at bedside and call bell in reach 

## 2018-03-23 NOTE — Progress Notes (Signed)
[place] for heparin Indication: chest pain/ACS  Allergies  Allergen Reactions  . Bee Venom Swelling  . Latex Swelling    LATEX CATHETERS    Patient Measurements: Height: 5\' 6"  (167.6 cm) Weight: 240 lb 3.2 oz (109 kg) IBW/kg (Calculated) : 59.3  Heparin Dosing Weight: 87 kg   Vital Signs: Temp: 98 F (36.7 C) (05/08 1855) Temp Source: Oral (05/08 1855) BP: 136/81 (05/08 1855) Pulse Rate: 65 (05/08 1855)  Labs: Recent Labs    03/23/18 0907 03/23/18 1254 03/23/18 1645 03/23/18 2003  HGB 11.5*  --   --   --   HCT 36.3  --   --   --   PLT 294  --   --   --   APTT  --  29  --  46*  LABPROT  --  14.1  --   --   INR  --  1.10  --   --   HEPARINUNFRC  --   --   --  1.30*  CREATININE 1.22*  --   --   --   TROPONINI  --   --  <0.03  --     Estimated Creatinine Clearance: 49.8 mL/min (A) (by C-G formula based on SCr of 1.22 mg/dL (H)).   Assessment: 77 YOF with shortness of breath and midsternal burning sensation. Pharmacy consulted to start IV heparin for ACS. Initial troponin is negative.  Of note, patient is on Eliquis at home and she states her last dose was around 10 PM on 5/7.  aPTT is subtherapeutic at 46 sec; heparin level is elevated due to recent Eliquis administration so will use aPTT to dose heparin drip. No bleeding noted.  Goal of Therapy:  Heparin level 0.3-0.7 units/ml aPTT 66-102 seconds Monitor platelets by anticoagulation protocol: Yes   Plan:  - Increase heparin infusion to 1250 units/hr - F/u 8 hr aPTT and heparin level - Monitor daily aPTT, heparin level, CBC and s/s of bleeding - F/u plans for cath   [facility], PharmD, BCPS Clinical Pharmacist Clinical phone for 03/23/2018 until 10p is [phone] After 10p, please call Main Rx at [phone] for assistance 03/23/2018 9:30 PM

## 2018-03-23 NOTE — ED Notes (Signed)
Patient transported to X-ray 

## 2018-03-23 NOTE — ED Triage Notes (Addendum)
Per EMS- pt from [name] for c.o. Burning central/across the chest since last night. They scheduled a cath for her for tomorrow. Pt was given 1 nitro and 324 Asprin at [facility]. Pt reports it did help but states "it does not really hurt it just burns." 18G to Brandi Hunter Pt rhythm 60s paced. Pt also recovering from URI with a cough.

## 2018-03-24 ENCOUNTER — Encounter (HOSPITAL_COMMUNITY)
Admission: EM | Disposition: A | Discharge status: Home / Self Care | Payer: Self-pay | Source: Home / Self Care | Attending: Emergency Medicine

## 2018-03-24 ENCOUNTER — Ambulatory Visit (HOSPITAL_COMMUNITY): Admission: RE | Admit: 2018-03-24 | Payer: Medicare HMO | Source: Ambulatory Visit | Admitting: Cardiovascular Disease

## 2018-03-24 ENCOUNTER — Other Ambulatory Visit: Payer: Self-pay

## 2018-03-24 DIAGNOSIS — I25110 Atherosclerotic heart disease of native coronary artery with unstable angina pectoris: Secondary | ICD-10-CM

## 2018-03-24 DIAGNOSIS — I5032 Chronic diastolic (congestive) heart failure: Secondary | ICD-10-CM | POA: Diagnosis not present

## 2018-03-24 DIAGNOSIS — M1990 Unspecified osteoarthritis, unspecified site: Secondary | ICD-10-CM | POA: Diagnosis not present

## 2018-03-24 DIAGNOSIS — I110 Hypertensive heart disease with heart failure: Secondary | ICD-10-CM | POA: Diagnosis not present

## 2018-03-24 HISTORY — PX: LEFT HEART CATH AND CORONARY ANGIOGRAPHY: CATH118249

## 2018-03-24 LAB — LIPID PANEL
CHOL/HDL RATIO: 3.6 ratio
Cholesterol: 125 mg/dL (ref 0–200)
HDL: 35 mg/dL — AB (ref >40)
LDL CALC: 51 mg/dL (ref 0–99)
Triglycerides: 194 mg/dL — ABNORMAL HIGH (ref <150)
VLDL: 39 mg/dL (ref 0–40)

## 2018-03-24 LAB — BASIC METABOLIC PANEL
Anion gap: 10 (ref 5–15)
BUN: 15 mg/dL (ref 6–20)
CO2: 29 mmol/L (ref 22–32)
Calcium: 9.1 mg/dL (ref 8.9–10.3)
Chloride: 100 mmol/L — ABNORMAL LOW (ref 101–111)
Creatinine, Ser: 1.2 mg/dL — ABNORMAL HIGH (ref 0.44–1.00)
GFR calc Af Amer: 50 mL/min — ABNORMAL LOW (ref >60)
GFR, EST NON AFRICAN AMERICAN: 43 mL/min — AB (ref >60)
GLUCOSE: 205 mg/dL — AB (ref 65–99)
Potassium: 3.6 mmol/L (ref 3.5–5.1)
Sodium: 139 mmol/L (ref 135–145)

## 2018-03-24 LAB — GLUCOSE, CAPILLARY
GLUCOSE-CAPILLARY: 190 mg/dL — AB (ref 65–99)
GLUCOSE-CAPILLARY: 123 mg/dL — AB (ref 65–99)
Glucose-Capillary: 142 mg/dL — ABNORMAL HIGH (ref 65–99)
Glucose-Capillary: 168 mg/dL — ABNORMAL HIGH (ref 65–99)

## 2018-03-24 LAB — CBC
HEMATOCRIT: 37.7 % (ref 36.0–46.0)
HEMOGLOBIN: 11.9 g/dL — AB (ref 12.0–15.0)
MCH: 28.1 pg (ref 26.0–34.0)
MCHC: 31.6 g/dL (ref 30.0–36.0)
MCV: 89.1 fL (ref 78.0–100.0)
Platelets: 252 10*3/uL (ref 150–400)
RBC: 4.23 MIL/uL (ref 3.87–5.11)
RDW: 13.9 % (ref 11.5–15.5)
WBC: 7.8 10*3/uL (ref 4.0–10.5)

## 2018-03-24 LAB — TROPONIN I: Troponin I: <0.03 ng/mL (ref <0.03)

## 2018-03-24 LAB — HEPARIN LEVEL (UNFRACTIONATED): HEPARIN UNFRACTIONATED: 1.04 [IU]/mL — AB (ref 0.30–0.70)

## 2018-03-24 LAB — MAGNESIUM: MAGNESIUM: 1.4 mg/dL — AB (ref 1.7–2.4)

## 2018-03-24 LAB — APTT: APTT: 79 s — AB (ref 24–36)

## 2018-03-24 SURGERY — LEFT HEART CATH AND CORONARY ANGIOGRAPHY
Anesthesia: LOCAL

## 2018-03-24 MED ORDER — SODIUM CHLORIDE 0.9 % IV SOLN
INTRAVENOUS | Status: DC
  Administered 2018-03-24: 06:00:00 via INTRAVENOUS

## 2018-03-24 MED ORDER — TRAMADOL HCL 50 MG PO TABS: 50.0000 mg | ORAL_TABLET | Freq: Four times a day (QID) | ORAL | Status: DC | PRN

## 2018-03-24 MED ORDER — HEPARIN (PORCINE) IN NACL 1000-0.9 UT/500ML-% IV SOLN
INTRAVENOUS | Status: AC
  Filled 2018-03-24: qty 1000

## 2018-03-24 MED ORDER — SODIUM CHLORIDE 0.9 % IV SOLN
INTRAVENOUS | Status: AC
  Administered 2018-03-24: 16:00:00 via INTRAVENOUS

## 2018-03-24 MED ORDER — LIDOCAINE HCL (PF) 1 % IJ SOLN
INTRAMUSCULAR | Status: DC | PRN
  Administered 2018-03-24: 15 mL
  Administered 2018-03-24: 2 mL

## 2018-03-24 MED ORDER — ISOSORBIDE MONONITRATE ER 30 MG PO TB24
15.0000 mg | ORAL_TABLET | Freq: Every day | ORAL | Status: DC
  Administered 2018-03-24 – 2018-03-25 (×2): 15 mg via ORAL
  Filled 2018-03-24 (×2): qty 1

## 2018-03-24 MED ORDER — ASPIRIN 81 MG PO CHEW: 81.0000 mg | CHEWABLE_TABLET | Freq: Every day | ORAL | Status: DC | PRN

## 2018-03-24 MED ORDER — LORAZEPAM 0.5 MG PO TABS: 0.2500 mg | ORAL_TABLET | Freq: Two times a day (BID) | ORAL | Status: DC | PRN

## 2018-03-24 MED ORDER — MIDAZOLAM HCL 2 MG/2ML IJ SOLN
INTRAMUSCULAR | Status: DC | PRN
  Administered 2018-03-24: 1 mg via INTRAVENOUS

## 2018-03-24 MED ORDER — IOPAMIDOL (ISOVUE-370) INJECTION 76%
INTRAVENOUS | Status: AC
  Filled 2018-03-24: qty 100

## 2018-03-24 MED ORDER — VERAPAMIL HCL 2.5 MG/ML IV SOLN
INTRAVENOUS | Status: DC | PRN
  Administered 2018-03-24: 7.5 mL via INTRA_ARTERIAL

## 2018-03-24 MED ORDER — NITROGLYCERIN 0.4 MG SL SUBL: 0.4000 mg | SUBLINGUAL_TABLET | SUBLINGUAL | Status: DC | PRN

## 2018-03-24 MED ORDER — LIDOCAINE HCL (PF) 1 % IJ SOLN
INTRAMUSCULAR | Status: AC
  Filled 2018-03-24: qty 30

## 2018-03-24 MED ORDER — MIDAZOLAM HCL 2 MG/2ML IJ SOLN
INTRAMUSCULAR | Status: AC
  Filled 2018-03-24: qty 2

## 2018-03-24 MED ORDER — ATORVASTATIN CALCIUM 40 MG PO TABS
40.0000 mg | ORAL_TABLET | Freq: Every day | ORAL | Status: DC
  Administered 2018-03-24: 40 mg via ORAL
  Filled 2018-03-24: qty 1

## 2018-03-24 MED ORDER — INSULIN ASPART 100 UNIT/ML ~~LOC~~ SOLN
0.0000 [IU] | Freq: Three times a day (TID) | SUBCUTANEOUS | Status: DC
  Administered 2018-03-24: 2 [IU] via SUBCUTANEOUS
  Administered 2018-03-24 – 2018-03-25 (×2): 3 [IU] via SUBCUTANEOUS

## 2018-03-24 MED ORDER — APIXABAN 5 MG PO TABS: 5.0000 mg | ORAL_TABLET | Freq: Two times a day (BID) | ORAL | 3 refills | Status: DC

## 2018-03-24 MED ORDER — ASPIRIN 81 MG PO CHEW: 81.0000 mg | CHEWABLE_TABLET | ORAL | Status: DC

## 2018-03-24 MED ORDER — FUROSEMIDE 40 MG PO TABS
40.0000 mg | ORAL_TABLET | Freq: Every day | ORAL | Status: DC
  Administered 2018-03-25: 40 mg via ORAL
  Filled 2018-03-24: qty 1

## 2018-03-24 MED ORDER — GLIPIZIDE ER 10 MG PO TB24
10.0000 mg | ORAL_TABLET | Freq: Two times a day (BID) | ORAL | Status: DC
Start: 2018-03-24 — End: 2018-03-25
  Administered 2018-03-24 – 2018-03-25 (×2): 10 mg via ORAL
  Filled 2018-03-24 (×2): qty 1

## 2018-03-24 MED ORDER — ASPIRIN 81 MG PO CHEW
81.0000 mg | CHEWABLE_TABLET | Freq: Every day | ORAL | Status: DC
  Administered 2018-03-25: 81 mg via ORAL
  Filled 2018-03-24: qty 1

## 2018-03-24 MED ORDER — SODIUM CHLORIDE 0.9% FLUSH: 3.0000 mL | INTRAVENOUS | Status: DC | PRN

## 2018-03-24 MED ORDER — SODIUM CHLORIDE 0.9% FLUSH
3.0000 mL | Freq: Two times a day (BID) | INTRAVENOUS | Status: DC
  Administered 2018-03-25: 3 mL via INTRAVENOUS

## 2018-03-24 MED ORDER — MAGNESIUM SULFATE 2 GM/50ML IV SOLN
2.0000 g | Freq: Once | INTRAVENOUS | Status: AC
  Administered 2018-03-24: 2 g via INTRAVENOUS
  Filled 2018-03-24: qty 50

## 2018-03-24 MED ORDER — FENTANYL CITRATE (PF) 100 MCG/2ML IJ SOLN
INTRAMUSCULAR | Status: AC
  Filled 2018-03-24: qty 2

## 2018-03-24 MED ORDER — MORPHINE SULFATE (PF) 10 MG/ML IV SOLN: 2.0000 mg | INTRAVENOUS | Status: DC | PRN

## 2018-03-24 MED ORDER — HEPARIN (PORCINE) IN NACL 2-0.9 UNITS/ML
INTRAMUSCULAR | Status: AC | PRN
  Administered 2018-03-24 (×2): 500 mL

## 2018-03-24 MED ORDER — VERAPAMIL HCL 2.5 MG/ML IV SOLN
INTRAVENOUS | Status: AC
  Filled 2018-03-24: qty 2

## 2018-03-24 MED ORDER — ISOSORBIDE MONONITRATE ER 30 MG PO TB24: 15.0000 mg | ORAL_TABLET | Freq: Every day | ORAL | 6 refills | Status: DC

## 2018-03-24 MED ORDER — ALBUTEROL SULFATE (2.5 MG/3ML) 0.083% IN NEBU: 3.0000 mL | INHALATION_SOLUTION | RESPIRATORY_TRACT | Status: DC | PRN

## 2018-03-24 MED ORDER — ONDANSETRON HCL 4 MG/2ML IJ SOLN: 4.0000 mg | Freq: Four times a day (QID) | INTRAMUSCULAR | Status: DC | PRN

## 2018-03-24 MED ORDER — SPIRONOLACTONE 25 MG PO TABS
25.0000 mg | ORAL_TABLET | Freq: Every day | ORAL | Status: DC
  Administered 2018-03-24 – 2018-03-25 (×2): 25 mg via ORAL
  Filled 2018-03-24 (×2): qty 1

## 2018-03-24 MED ORDER — METFORMIN HCL 1000 MG PO TABS: 1000.0000 mg | ORAL_TABLET | Freq: Two times a day (BID) | ORAL | Status: DC

## 2018-03-24 MED ORDER — FENTANYL CITRATE (PF) 100 MCG/2ML IJ SOLN
INTRAMUSCULAR | Status: DC | PRN
  Administered 2018-03-24: 25 ug via INTRAVENOUS

## 2018-03-24 MED ORDER — HEPARIN SODIUM (PORCINE) 1000 UNIT/ML IJ SOLN
INTRAMUSCULAR | Status: AC
  Filled 2018-03-24: qty 1

## 2018-03-24 MED ORDER — METOPROLOL SUCCINATE ER 100 MG PO TB24
100.0000 mg | ORAL_TABLET | Freq: Every day | ORAL | Status: DC
  Administered 2018-03-25: 100 mg via ORAL
  Filled 2018-03-24: qty 1

## 2018-03-24 MED ORDER — OXYBUTYNIN CHLORIDE ER 10 MG PO TB24
10.0000 mg | ORAL_TABLET | Freq: Every day | ORAL | Status: DC
  Administered 2018-03-24: 10 mg via ORAL
  Filled 2018-03-24: qty 1

## 2018-03-24 MED ORDER — FENOFIBRATE 160 MG PO TABS: 160.0000 mg | ORAL_TABLET | Freq: Every evening | ORAL | Status: DC

## 2018-03-24 MED ORDER — ASPIRIN 81 MG PO CHEW
81.0000 mg | CHEWABLE_TABLET | ORAL | Status: AC
  Administered 2018-03-24: 81 mg via ORAL
  Filled 2018-03-24: qty 1

## 2018-03-24 MED ORDER — AMLODIPINE BESYLATE 5 MG PO TABS
5.0000 mg | ORAL_TABLET | Freq: Every day | ORAL | Status: DC
  Administered 2018-03-24: 5 mg via ORAL
  Filled 2018-03-24: qty 1

## 2018-03-24 MED ORDER — NITROGLYCERIN 1 MG/10 ML FOR IR/CATH LAB
INTRA_ARTERIAL | Status: AC
  Filled 2018-03-24: qty 10

## 2018-03-24 MED ORDER — IOPAMIDOL (ISOVUE-370) INJECTION 76%
INTRAVENOUS | Status: DC | PRN
  Administered 2018-03-24: 70 mL

## 2018-03-24 MED ORDER — ACETAMINOPHEN 325 MG PO TABS
650.0000 mg | ORAL_TABLET | ORAL | Status: DC | PRN
  Administered 2018-03-25: 650 mg via ORAL
  Filled 2018-03-24: qty 2

## 2018-03-24 MED ORDER — SODIUM CHLORIDE 0.9 % IV SOLN: 250.0000 mL | INTRAVENOUS | Status: DC | PRN

## 2018-03-24 SURGICAL SUPPLY — 15 items
CATH INFINITI 5FR MULTPACK ANG (CATHETERS) ×1 IMPLANT
CATH OPTITORQUE TIG 4.0 5F (CATHETERS) ×1 IMPLANT
DEVICE CLOSURE MYNXGRIP 5F (Vascular Products) ×1 IMPLANT
DEVICE RAD COMP TR BAND LRG (VASCULAR PRODUCTS) ×1 IMPLANT
GLIDESHEATH SLEND A-KIT 6F 22G (SHEATH) ×1 IMPLANT
GUIDEWIRE INQWIRE 1.5J.035X260 (WIRE) IMPLANT
INQWIRE 1.5J .035X260CM (WIRE) ×2
KIT HEART LEFT (KITS) ×2 IMPLANT
PACK CARDIAC CATHETERIZATION (CUSTOM PROCEDURE TRAY) ×2 IMPLANT
SHEATH PINNACLE 5F 10CM (SHEATH) ×1 IMPLANT
SYR MEDRAD MARK V 150ML (SYRINGE) ×2 IMPLANT
TRANSDUCER W/STOPCOCK (MISCELLANEOUS) ×2 IMPLANT
TUBING CIL FLEX 10 FLL-RA (TUBING) ×2 IMPLANT
WIRE EMERALD 3MM-J .035X150CM (WIRE) ×1 IMPLANT
WIRE HI TORQ VERSACORE-J 145CM (WIRE) ×1 IMPLANT

## 2018-03-24 NOTE — Discharge Summary (Addendum)
Discharge Summary    Patient ID: Brandi Hunter,  MRN: [id number], DOB/AGE: November 02, 1941 77 y.o.  Admit date: 03/23/2018 Discharge date: 03/24/2018  Primary Care Provider: [name] Primary Cardiologist: Thompson Grayer, MD  Discharge Diagnoses    Principal Problem:   Unstable angina [facility]) Active Problems:   Essential hypertension   Type 2 diabetes mellitus ([place])   Chronic diastolic heart failure (HCC)   Cardiac pacemaker   PAF (paroxysmal atrial fibrillation) (HCC)   Allergies Allergies  Allergen Reactions  . Bee Venom Swelling  . Latex Swelling    LATEX CATHETERS    Diagnostic Studies/Procedures    Cardiac catheterization: 03/24/18  Procedures   LEFT HEART CATH AND CORONARY ANGIOGRAPHY  Conclusion     Ost 2nd Mrg lesion is 90% stenosed.  The left ventricular systolic function is normal.  LV end diastolic pressure is normal.  The left ventricular ejection fraction is 55-65% by visual estimate.      _____________   History of Present Illness     77 yo female who was admitted 03/23/18 from the office due to SOB, and burning band around her chest, mid-sternal and was present on visit.  It was decided to admit, hold Eliquis, and plan cardiac cath for today.    She has a hx  Of PAF on Eliquis, HTN, DM, obesity, asthma, COPD with ongoing tobacco use, OSA and depression.  Last nuc 04/2017 was neg for ischemia.    Hospital Course     Consultants: none  Labs on admit were stable neg for MI.   She was on IV heparin for angina and statin and BB.  BP was stable.  On 03/24/18, pt had cardiac cath with  2nd marginal lesion at 90%.  Dr. Gwenlyn Found felt medical therapy was best direction.  Dr. Radford Pax discussed and Imdur 15 mg po added.    Pt's Eliqui was held post cath and resumed 03/25/18.  She will follow up in TOC 7-14 days in the office.  Continue other home meds.    _____________  Discharge Vitals Blood pressure (!) 126/91, pulse (!) 59, temperature 98.2 F (36.8  C), temperature source Oral, resp. rate 13, height 5\' 6"  (1.676 m), weight 241 lb 4.8 oz (109.5 kg), SpO2 95 %.  Filed Weights   03/23/18 1855 03/24/18 0417  Weight: 240 lb 3.2 oz (109 kg) 241 lb 4.8 oz (109.5 kg)    Labs & Radiologic Studies    CBC Recent Labs    03/23/18 0907 03/24/18 0546  WBC 8.0 7.8  HGB 11.5* 11.9*  HCT 36.3 37.7  MCV 89.9 89.1  PLT 294 623   Basic Metabolic Panel Recent Labs    03/23/18 0907 03/23/18 1254 03/24/18 0508  NA 140  --  139  K 3.6  --  3.6  CL 104  --  100*  CO2 25  --  29  GLUCOSE 172*  --  205*  BUN 14  --  15  CREATININE 1.22*  --  1.20*  CALCIUM 8.6*  --  9.1  MG  --  1.4* 1.4*   Liver Function Tests No results for input(s): AST, ALT, ALKPHOS, BILITOT, PROT, ALBUMIN in the last 72 hours. No results for input(s): LIPASE, AMYLASE in the last 72 hours. Cardiac Enzymes Recent Labs    03/23/18 1645 03/23/18 2332 03/24/18 0508  TROPONINI <0.03 <0.03 <0.03   BNP Invalid input(s): POCBNP D-Dimer No results for input(s): DDIMER in the last 72 hours. Hemoglobin A1C  Recent Labs    03/23/18 1254  HGBA1C 7.5*   Fasting Lipid Panel Recent Labs    03/24/18 0508  CHOL 125  HDL 35*  LDLCALC 51  TRIG 194*  CHOLHDL 3.6   Thyroid Function Tests Recent Labs    03/23/18 1254  TSH 1.508   _____________  Dg Chest 2 View  Result Date: 03/23/2018 CLINICAL DATA:  Central chest pain and burning since last night. EXAM: CHEST - 2 VIEW COMPARISON:  09/26/2017.  09/27/2017. FINDINGS: Heart size upper limits of normal. Dual lead pacemaker in place with leads in the region of the right atrium and right ventricle. Pulmonary vascularity is normal. There is elevation of the right hemidiaphragm with chronic volume loss in the right lower lung. No effusions. No acute bone finding. IMPRESSION: Chronic elevation of the right hemidiaphragm with chronic volume loss at the right base. No active finding otherwise. Electronically Signed   By: Nelson Chimes M.D.   On: 03/23/2018 10:06   Disposition   Pt is being discharged home today in good condition.  Follow-up Plans & Appointments  Call [facility] at [phone] if any bleeding, swelling or drainage at cath site.  May shower, no tub baths for 48 hours for groin sticks. No lifting over 5 pounds for 3 days.  No Driving for 3 days if you drive.   Heart healthy diet   We added imdur to your medications which will prevent pain and help with shortness of breath.  Call if any problems or questions.    Do not start Eiquis until tomorrow morning  Hold metformin until 03/27/18 to prevent interaction with cath dye.    Follow-up Information    [name], NP. Schedule an appointment as soon as possible for a visit.   Specialties:  Nurse Practitioner, Interventional Cardiology, Cardiology, Radiology Why:  to be seen in 10-14 days I will send a message as well.  Contact information: [place] [place] [address] [phone]            Discharge Medications   Allergies as of 03/24/2018      Reactions   Bee Venom Swelling   Latex Swelling   LATEX CATHETERS      Medication List    TAKE these medications   ALIGN PO Take 1 capsule by mouth daily.   amLODipine 5 MG tablet Commonly known as:  NORVASC Take 1 tablet (5 mg total) by mouth daily.   apixaban 5 MG Tabs tablet Commonly known as:  ELIQUIS Take 1 tablet (5 mg total) by mouth 2 (two) times daily. Start taking on:  03/25/2018   aspirin 81 MG chewable tablet Chew 81 mg by mouth daily as needed for mild pain.   atorvastatin 40 MG tablet Commonly known as:  LIPITOR Take 40 mg by mouth daily.   budesonide-formoterol 160-4.5 MCG/ACT inhaler Commonly known as:  SYMBICORT Inhale 2 puffs into the lungs 2 (two) times daily.   fenofibrate 160 MG tablet Take 160 mg by mouth every evening.   furosemide 40 MG tablet Commonly known as:  LASIX Take 1 tablet (40 mg  total) by mouth daily.   glipiZIDE 10 MG 24 hr tablet Commonly known as:  GLUCOTROL XL Take 20 mg by mouth daily with breakfast.   guaiFENesin 600 MG 12 hr tablet Commonly known as:  MUCINEX Take 600 mg by mouth 2 (two) times daily.   ipratropium-albuterol 0.5-2.5 (3) MG/3ML Soln Commonly known  as:  DUONEB Take 3 mLs by nebulization 2 (two) times daily.   isosorbide mononitrate 30 MG 24 hr tablet Commonly known as:  IMDUR Take 0.5 tablets (15 mg total) by mouth daily.   LORazepam 0.5 MG tablet Commonly known as:  ATIVAN Take 0.25-0.5 mg by mouth 2 (two) times daily as needed for anxiety.   metFORMIN 1000 MG tablet Commonly known as:  GLUCOPHAGE Take 1 tablet (1,000 mg total) by mouth 2 (two) times daily with a meal. Start taking on:  03/27/2018 What changed:  These instructions start on 03/27/2018. If you are unsure what to do until then, ask your doctor or other care provider.   methocarbamol 500 MG tablet Commonly known as:  ROBAXIN Take 1 tablet by mouth 2 (two) times daily as needed for muscle spasms.   metoprolol succinate 100 MG 24 hr tablet Commonly known as:  TOPROL-XL TAKE 1 TABLET BY MOUTH ONCE DAILY   nitroGLYCERIN 0.4 MG SL tablet Commonly known as:  NITROSTAT Place 1 tablet (0.4 mg total) under the tongue every 5 (five) minutes as needed for chest pain.   oxybutynin 10 MG 24 hr tablet Commonly known as:  DITROPAN-XL Take 10 mg by mouth daily.   PROAIR HFA 108 (90 Base) MCG/ACT inhaler Generic drug:  albuterol Inhale 2 puffs into the lungs every 4 (four) hours as needed for wheezing. Inhale 2 puffs into the lungs every 4 (four) hours as needed.   spironolactone 25 MG tablet Commonly known as:  ALDACTONE Take 1 tablet (25 mg total) by mouth daily.   telmisartan 80 MG tablet Commonly known as:  MICARDIS Take 80 mg by mouth daily.   traMADol 50 MG tablet Commonly known as:  ULTRAM Take 1 tablet by mouth every 6 (six) hours as needed for pain.     triamcinolone ointment 0.5 % Commonly known as:  KENALOG Apply 1 application topically 2 (two) times daily.         Outstanding Labs/Studies   BMP  Duration of Discharge Encounter   Greater than 30 minutes including physician time.  Signed, Huel Coventry, NP 03/24/2018, 5:55 PM

## 2018-03-24 NOTE — Progress Notes (Signed)
Cath on patient reviewed with Dr. Gwenlyn Found.  Patient has sub branch of OM2 90% stenosed but otherwise normal coronary arteries and normal LVF.  Recommend medical management. Will discharge home on ASA 81mg  daily, Toprol XL 100mg  daily, amlodipine 5mg  daily, lipitor 40mg  daily and Telmisartan 80mg  daily.  Will add Imdur 15mg  daily.  Restart metformin in 48 hours and restart Eliquis tomorrow am.

## 2018-03-24 NOTE — Discharge Instructions (Signed)
Call [facility] at [phone] if any bleeding, swelling or drainage at cath site.  May shower, no tub baths for 48 hours for groin sticks. No lifting over 5 pounds for 3 days.  No Driving for 3 days if you drive.   Heart healthy diet   We added imdur to your medications which will prevent pain and help with shortness of breath.  Call if any problems or questions.    Do not start Eiquis until tomorrow morning  Hold metformin until 03/27/18 to prevent interaction with cath dye.

## 2018-03-24 NOTE — Progress Notes (Signed)
[facility] for heparin Indication: chest pain/ACS  Allergies  Allergen Reactions  . Bee Venom Swelling  . Latex Swelling    LATEX CATHETERS    Patient Measurements: Height: 5\' 6"  (167.6 cm) Weight: 241 lb 4.8 oz (109.5 kg) IBW/kg (Calculated) : 59.3  Heparin Dosing Weight: 87 kg  Vital Signs: Temp: 98 F (36.7 C) (05/09 0753) Temp Source: Oral (05/09 0753) BP: 152/51 (05/09 0753) Pulse Rate: 62 (05/09 0753)  Labs: Recent Labs    03/23/18 0907 03/23/18 1254 03/23/18 1645 03/23/18 2003 03/23/18 2332 03/24/18 0508 03/24/18 0546  HGB 11.5*  --   --   --   --   --  11.9*  HCT 36.3  --   --   --   --   --  37.7  PLT 294  --   --   --   --   --  252  APTT  --  29  --  46*  --   --  79*  LABPROT  --  14.1  --   --   --   --   --   INR  --  1.10  --   --   --   --   --   HEPARINUNFRC  --   --   --  1.30*  --   --  1.04*  CREATININE 1.22*  --   --   --   --  1.20*  --   TROPONINI  --   --  <0.03  --  <0.03 <0.03  --     Estimated Creatinine Clearance: 50.8 mL/min (A) (by C-G formula based on SCr of 1.2 mg/dL (H)).   Assessment: 77 YOF with shortness of breath and midsternal burning sensation. Pharmacy consulted to start IV heparin for ACS. Troponins negative. CBC stable.  Of note, patient is on Eliquis at home and she states her last dose was around 10 PM on 5/7.  aPTT is therapeutic at 79 sec; heparin level is elevated at 1.04 due to recent Eliquis administration so will use aPTT to dose heparin drip. No bleeding noted. Looks like plan for cath today at 1300.  Goal of Therapy:  Heparin level 0.3-0.7 units/ml aPTT 66-102 seconds Monitor platelets by anticoagulation protocol: Yes   Plan:  - Continue heparin infusion at 1250 units/hr - Monitor daily aPTT, heparin level, CBC and s/s of bleeding - F/u post cath   Thank you for allowing [place] to participate in this patients care.  Jens Som, PharmD Clinical phone for  03/24/2018 from 7a-3:30p: [phone] If after 3:30p, please call main pharmacy at: [phone] 03/24/2018 8:17 AM

## 2018-03-24 NOTE — Progress Notes (Signed)
Patient set up on CPAP with full face mask, pt wears FFM at home as well.  2LPM oxygen bled in.  Tolerating well no issues to report.

## 2018-03-24 NOTE — H&P (View-Only) (Signed)
Progress Note  Patient Name: Brandi Hunter Date of Encounter: 03/24/2018  Primary Cardiologist: Dr. [name]  Subjective   Notes mild 2/10 chest burning. No dyspnea.   Inpatient Medications    Scheduled Meds: . aspirin EC  81 mg Oral Daily  . atorvastatin  40 mg Oral q1800  . fenofibrate  160 mg Oral Daily  . furosemide  40 mg Oral Daily  . glipiZIDE  10 mg Oral BID  . ipratropium-albuterol  3 mL Nebulization BID  . losartan  100 mg Oral Daily  . metoprolol succinate  100 mg Oral Daily  . oxybutynin  10 mg Oral QHS  . sodium chloride flush  3 mL Intravenous Q12H  . sodium chloride flush  3 mL Intravenous Q12H  . spironolactone  25 mg Oral Daily   Continuous Infusions: . sodium chloride    . sodium chloride    . sodium chloride 100 mL/hr at 03/24/18 0546  . heparin 1,250 Units/hr (03/24/18 0200)  . magnesium sulfate 1 - 4 g bolus IVPB Stopped (03/24/18 0859)  . nitroGLYCERIN 5 mcg/min (03/23/18 2000)   PRN Meds: sodium chloride, sodium chloride, acetaminophen, LORazepam, nitroGLYCERIN, ondansetron (ZOFRAN) IV, sodium chloride flush, sodium chloride flush, traMADol   Vital Signs    Vitals:   03/24/18 0001 03/24/18 0006 03/24/18 0417 03/24/18 0753  BP:  (!) 155/67 140/71 (!) 152/51  Pulse:  64 (!) 59 62  Resp:      Temp:  97.7 F (36.5 C) (!) 97.5 F (36.4 C) 98 F (36.7 C)  TempSrc:  Oral Oral Oral  SpO2: 100% 100% 100% 97%  Weight:   241 lb 4.8 oz (109.5 kg)   Height:        Intake/Output Summary (Last 24 hours) at 03/24/2018 0816 Last data filed at 03/24/2018 0419 Gross per 24 hour  Intake 558.66 ml  Output 1100 ml  Net -541.34 ml   Filed Weights   03/23/18 1855 03/24/18 0417  Weight: 240 lb 3.2 oz (109 kg) 241 lb 4.8 oz (109.5 kg)    Telemetry    NSR - Personally Reviewed  ECG     AV dual-paced rhythm with prolonged AV conduction- Personally Reviewed  Physical Exam   GEN: No acute distress.  Obese  Neck: No JVD Cardiac: RRR, no murmurs, rubs,  or gallops.  Respiratory: Clear to auscultation bilaterally. GI: Soft, nontender, non-distended  MS: No edema; No deformity. Neuro:  Nonfocal  Psych: Normal affect   Labs    Chemistry Recent Labs  Lab 03/23/18 0907 03/24/18 0508  NA 140 139  K 3.6 3.6  CL 104 100*  CO2 25 29  GLUCOSE 172* 205*  BUN 14 15  CREATININE 1.22* 1.20*  CALCIUM 8.6* 9.1  GFRNONAA 42* 43*  GFRAA 49* 50*  ANIONGAP 11 10     Hematology Recent Labs  Lab 03/23/18 0907 03/24/18 0546  WBC 8.0 7.8  RBC 4.04 4.23  HGB 11.5* 11.9*  HCT 36.3 37.7  MCV 89.9 89.1  MCH 28.5 28.1  MCHC 31.7 31.6  RDW 14.0 13.9  PLT 294 252    Cardiac Enzymes Recent Labs  Lab 03/23/18 1645 03/23/18 2332 03/24/18 0508  TROPONINI <0.03 <0.03 <0.03    Recent Labs  Lab 03/23/18 0935  TROPIPOC 0.02     BNP Recent Labs  Lab 03/23/18 1254  BNP 127.4*     DDimer No results for input(s): DDIMER in the last 168 hours.   Radiology    Dg  Chest 2 View  Result Date: 03/23/2018 CLINICAL DATA:  Central chest pain and burning since last night. EXAM: CHEST - 2 VIEW COMPARISON:  09/26/2017.  09/27/2017. FINDINGS: Heart size upper limits of normal. Dual lead pacemaker in place with leads in the region of the right atrium and right ventricle. Pulmonary vascularity is normal. There is elevation of the right hemidiaphragm with chronic volume loss in the right lower lung. No effusions. No acute bone finding. IMPRESSION: Chronic elevation of the right hemidiaphragm with chronic volume loss at the right base. No active finding otherwise. Electronically Signed   By: Nelson Chimes M.D.   On: 03/23/2018 10:06    Cardiac Studies   LHC- pending.   Previous studies  EchoStudy ConclusionsDecember 2018  - Left ventricle: The cavity size was normal. There was moderate concentric hypertrophy. Systolic function was normal. The estimated ejection fraction was in the range of 60% to 65%. Wall motion was normal; there were  no regional wall motion abnormalities. Features are consistent with a pseudonormal left ventricular filling pattern, with concomitant abnormal relaxation and increased filling pressure (grade 2 diastolic dysfunction). - Left atrium: The atrium was mildly dilated. - Atrial septum: The septum bowed from left to right, consistent with increased left atrial pressure. No defect or patent foramen ovale was identified. - Pulmonary arteries: Systolic pressure was mildly increased. PA peak pressure: 37 mm Hg (S).    MyoviewStudy Highlights6/2018   Nuclear stress EF: 65%.  Probable normal perfusion and soft tissue attenuation  This is a low risk study.      Patient Profile     77 y.o. 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   Assessment & Plan    1. CP Concerning for Unstable Angina: direct admit from the office. Troponins are negative x 3. She is on IV heparin. Plan for definitive LHC today. Continue ASA, statin and BB.  2. HTN: home meds were ordered on admit> metoprolol, losartan, lasix and spironolactone.  3. Chronic Diastolic HF: euvoelmic on exam. No dyspnea.   4. PPM: implanted for type 2 HB. Followed by EP.   3. PAF: on Eliquis. Last dose was night of 03/22/18. Continue to hold for cath.   6. H/o Tobacco Abuse: quit November 2018.    For questions or updates, please contact [place] Please consult [email or web address] for contact info under Cardiology/STEMI.      Signed, Lyda Jester, PA-C  03/24/2018, 8:16 AM

## 2018-03-24 NOTE — Progress Notes (Signed)
Pt stated she did not feel well enough to leave tonight.  She understood Dr. Gwenlyn Found say she would go home tomorrow.  I have started Imdur so will keep and see that it works for her.

## 2018-03-24 NOTE — Progress Notes (Addendum)
0000 Respiratory in room setting up Cpap. Pt offered drink, refused. Reviewed NPO status and POC. Pt understands without assistance.  No needs at this time, lines checked and verified, WCTM.   0215 Pt sleeping comfortably, NAD.   0545 Pt medicated per MAR, right groin prepped for heart cath. Pt watching heart cath video, family at bedside. Pt denies CP, SOB. No complaints. WCTM.

## 2018-03-24 NOTE — Interval H&P Note (Signed)
Cath Lab Visit (complete for each Cath Lab visit)  Clinical Evaluation Leading to the Procedure:   ACS: No.  Non-ACS:    Anginal Classification: CCS II  Anti-ischemic medical therapy: No Therapy  Non-Invasive Test Results: No non-invasive testing performed  Prior CABG: No previous CABG      History and Physical Interval Note:  03/24/2018 2:10 PM  Brandi Hunter  has presented today for surgery, with the diagnosis of unstable angina  The various methods of treatment have been discussed with the patient and family. After consideration of risks, benefits and other options for treatment, the patient has consented to  Procedure(s): LEFT HEART CATH AND CORONARY ANGIOGRAPHY (N/A) as a surgical intervention .  The patient's history has been reviewed, patient examined, no change in status, stable for surgery.  I have reviewed the patient's chart and labs.  Questions were answered to the patient's satisfaction.     Quay Burow

## 2018-03-24 NOTE — Progress Notes (Signed)
Progress Note  Patient Name: Brandi Hunter Date of Encounter: 03/24/2018  Primary Cardiologist: Dr. [name]  Subjective   Notes mild 2/10 chest burning. No dyspnea.   Inpatient Medications    Scheduled Meds: . aspirin EC  81 mg Oral Daily  . atorvastatin  40 mg Oral q1800  . fenofibrate  160 mg Oral Daily  . furosemide  40 mg Oral Daily  . glipiZIDE  10 mg Oral BID  . ipratropium-albuterol  3 mL Nebulization BID  . losartan  100 mg Oral Daily  . metoprolol succinate  100 mg Oral Daily  . oxybutynin  10 mg Oral QHS  . sodium chloride flush  3 mL Intravenous Q12H  . sodium chloride flush  3 mL Intravenous Q12H  . spironolactone  25 mg Oral Daily   Continuous Infusions: . sodium chloride    . sodium chloride    . sodium chloride 100 mL/hr at 03/24/18 0546  . heparin 1,250 Units/hr (03/24/18 0200)  . magnesium sulfate 1 - 4 g bolus IVPB Stopped (03/24/18 0859)  . nitroGLYCERIN 5 mcg/min (03/23/18 2000)   PRN Meds: sodium chloride, sodium chloride, acetaminophen, LORazepam, nitroGLYCERIN, ondansetron (ZOFRAN) IV, sodium chloride flush, sodium chloride flush, traMADol   Vital Signs    Vitals:   03/24/18 0001 03/24/18 0006 03/24/18 0417 03/24/18 0753  BP:  (!) 155/67 140/71 (!) 152/51  Pulse:  64 (!) 59 62  Resp:      Temp:  97.7 F (36.5 C) (!) 97.5 F (36.4 C) 98 F (36.7 C)  TempSrc:  Oral Oral Oral  SpO2: 100% 100% 100% 97%  Weight:   241 lb 4.8 oz (109.5 kg)   Height:        Intake/Output Summary (Last 24 hours) at 03/24/2018 0816 Last data filed at 03/24/2018 0419 Gross per 24 hour  Intake 558.66 ml  Output 1100 ml  Net -541.34 ml   Filed Weights   03/23/18 1855 03/24/18 0417  Weight: 240 lb 3.2 oz (109 kg) 241 lb 4.8 oz (109.5 kg)    Telemetry    NSR - Personally Reviewed  ECG     AV dual-paced rhythm with prolonged AV conduction- Personally Reviewed  Physical Exam   GEN: No acute distress.  Obese  Neck: No JVD Cardiac: RRR, no murmurs, rubs,  or gallops.  Respiratory: Clear to auscultation bilaterally. GI: Soft, nontender, non-distended  MS: No edema; No deformity. Neuro:  Nonfocal  Psych: Normal affect   Labs    Chemistry Recent Labs  Lab 03/23/18 0907 03/24/18 0508  NA 140 139  K 3.6 3.6  CL 104 100*  CO2 25 29  GLUCOSE 172* 205*  BUN 14 15  CREATININE 1.22* 1.20*  CALCIUM 8.6* 9.1  GFRNONAA 42* 43*  GFRAA 49* 50*  ANIONGAP 11 10     Hematology Recent Labs  Lab 03/23/18 0907 03/24/18 0546  WBC 8.0 7.8  RBC 4.04 4.23  HGB 11.5* 11.9*  HCT 36.3 37.7  MCV 89.9 89.1  MCH 28.5 28.1  MCHC 31.7 31.6  RDW 14.0 13.9  PLT 294 252    Cardiac Enzymes Recent Labs  Lab 03/23/18 1645 03/23/18 2332 03/24/18 0508  TROPONINI <0.03 <0.03 <0.03    Recent Labs  Lab 03/23/18 0935  TROPIPOC 0.02     BNP Recent Labs  Lab 03/23/18 1254  BNP 127.4*     DDimer No results for input(s): DDIMER in the last 168 hours.   Radiology    Dg  Chest 2 View  Result Date: 03/23/2018 CLINICAL DATA:  Central chest pain and burning since last night. EXAM: CHEST - 2 VIEW COMPARISON:  09/26/2017.  09/27/2017. FINDINGS: Heart size upper limits of normal. Dual lead pacemaker in place with leads in the region of the right atrium and right ventricle. Pulmonary vascularity is normal. There is elevation of the right hemidiaphragm with chronic volume loss in the right lower lung. No effusions. No acute bone finding. IMPRESSION: Chronic elevation of the right hemidiaphragm with chronic volume loss at the right base. No active finding otherwise. Electronically Signed   By: Nelson Chimes M.D.   On: 03/23/2018 10:06    Cardiac Studies   LHC- pending.   Previous studies  EchoStudy ConclusionsDecember 2018  - Left ventricle: The cavity size was normal. There was moderate concentric hypertrophy. Systolic function was normal. The estimated ejection fraction was in the range of 60% to 65%. Wall motion was normal; there were  no regional wall motion abnormalities. Features are consistent with a pseudonormal left ventricular filling pattern, with concomitant abnormal relaxation and increased filling pressure (grade 2 diastolic dysfunction). - Left atrium: The atrium was mildly dilated. - Atrial septum: The septum bowed from left to right, consistent with increased left atrial pressure. No defect or patent foramen ovale was identified. - Pulmonary arteries: Systolic pressure was mildly increased. PA peak pressure: 37 mm Hg (S).    MyoviewStudy Highlights6/2018   Nuclear stress EF: 65%.  Probable normal perfusion and soft tissue attenuation  This is a low risk study.      Patient Profile     77 y.o. 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   Assessment & Plan    1. CP Concerning for Unstable Angina: direct admit from the office. Troponins are negative x 3. She is on IV heparin. Plan for definitive LHC today. Continue ASA, statin and BB.  2. HTN: home meds were ordered on admit> metoprolol, losartan, lasix and spironolactone.  3. Chronic Diastolic HF: euvoelmic on exam. No dyspnea.   4. PPM: implanted for type 2 HB. Followed by EP.   3. PAF: on Eliquis. Last dose was night of 03/22/18. Continue to hold for cath.   6. H/o Tobacco Abuse: quit November 2018.    For questions or updates, please contact [place] Please consult [email or web address] for contact info under Cardiology/STEMI.      Signed, Lyda Jester, PA-C  03/24/2018, 8:16 AM

## 2018-03-25 ENCOUNTER — Encounter (HOSPITAL_COMMUNITY): Payer: Self-pay | Admitting: Cardiovascular Disease

## 2018-03-25 DIAGNOSIS — I2510 Atherosclerotic heart disease of native coronary artery without angina pectoris: Secondary | ICD-10-CM | POA: Diagnosis not present

## 2018-03-25 DIAGNOSIS — I10 Essential (primary) hypertension: Secondary | ICD-10-CM | POA: Diagnosis not present

## 2018-03-25 DIAGNOSIS — I200 Unstable angina: Secondary | ICD-10-CM | POA: Diagnosis not present

## 2018-03-25 DIAGNOSIS — I2583 Coronary atherosclerosis due to lipid rich plaque: Secondary | ICD-10-CM

## 2018-03-25 DIAGNOSIS — I480 Paroxysmal atrial fibrillation: Secondary | ICD-10-CM | POA: Diagnosis not present

## 2018-03-25 DIAGNOSIS — I5032 Chronic diastolic (congestive) heart failure: Secondary | ICD-10-CM | POA: Diagnosis not present

## 2018-03-25 LAB — CBC
HCT: 35.3 % — ABNORMAL LOW (ref 36.0–46.0)
Hemoglobin: 11 g/dL — ABNORMAL LOW (ref 12.0–15.0)
MCH: 28.1 pg (ref 26.0–34.0)
MCHC: 31.2 g/dL (ref 30.0–36.0)
MCV: 90.3 fL (ref 78.0–100.0)
PLATELETS: 237 10*3/uL (ref 150–400)
RBC: 3.91 MIL/uL (ref 3.87–5.11)
RDW: 14.1 % (ref 11.5–15.5)
WBC: 7 10*3/uL (ref 4.0–10.5)

## 2018-03-25 LAB — BASIC METABOLIC PANEL
ANION GAP: 8 (ref 5–15)
BUN: 14 mg/dL (ref 6–20)
CALCIUM: 8.7 mg/dL — AB (ref 8.9–10.3)
CO2: 28 mmol/L (ref 22–32)
CREATININE: 1.17 mg/dL — AB (ref 0.44–1.00)
Chloride: 102 mmol/L (ref 101–111)
GFR calc Af Amer: 51 mL/min — ABNORMAL LOW (ref >60)
GFR, EST NON AFRICAN AMERICAN: 44 mL/min — AB (ref >60)
GLUCOSE: 183 mg/dL — AB (ref 65–99)
Potassium: 3.5 mmol/L (ref 3.5–5.1)
Sodium: 138 mmol/L (ref 135–145)

## 2018-03-25 LAB — GLUCOSE, CAPILLARY
Glucose-Capillary: 200 mg/dL — ABNORMAL HIGH (ref 65–99)
Glucose-Capillary: 150 mg/dL — ABNORMAL HIGH (ref 65–99)

## 2018-03-25 LAB — HEPARIN LEVEL (UNFRACTIONATED): Heparin Unfractionated: 0.3 IU/mL (ref 0.30–0.70)

## 2018-03-25 LAB — APTT: APTT: 26 s (ref 24–36)

## 2018-03-25 MED ORDER — MORPHINE SULFATE (PF) 2 MG/ML IV SOLN: 2.0000 mg | INTRAVENOUS | Status: DC | PRN

## 2018-03-25 MED ORDER — APIXABAN 5 MG PO TABS
5.0000 mg | ORAL_TABLET | Freq: Two times a day (BID) | ORAL | Status: DC
  Administered 2018-03-25: 5 mg via ORAL
  Filled 2018-03-25: qty 1

## 2018-03-25 NOTE — Care Management Obs Status (Signed)
[facility] NOTIFICATION   Patient Details  Name: Brandi Hunter MRN: [id number] Date of Birth: Jun 01, 1941   Medicare Observation Status Notification Given:  Yes    Bethena Roys, RN 03/25/2018, 10:39 AM

## 2018-03-25 NOTE — Progress Notes (Signed)
The patient has been given discharge instructions along with a new medication list and what to take today. He has been educated on her heart cath site care. Appointments to schedule and some follow ups. The patient is discharging with her family via car.   Saddie Benders RN

## 2018-03-25 NOTE — Progress Notes (Signed)
[place] for apixaban Indication: atrial fibrillation  Allergies  Allergen Reactions  . Bee Venom Swelling  . Latex Swelling    LATEX CATHETERS    Patient Measurements: Height: 5\' 6"  (167.6 cm) Weight: 241 lb 14.4 oz (109.7 kg) IBW/kg (Calculated) : 59.3  Heparin Dosing Weight: 87 kg  Vital Signs: Temp: 97.5 F (36.4 C) (05/10 0531) Temp Source: Axillary (05/10 0531) BP: 146/71 (05/10 0833) Pulse Rate: 60 (05/10 0833)  Labs: Recent Labs    03/23/18 0907  03/23/18 1254 03/23/18 1645 03/23/18 2003 03/23/18 2332 03/24/18 0508 03/24/18 0546 03/25/18 0530  HGB 11.5*  --   --   --   --   --   --  11.9* 11.0*  HCT 36.3  --   --   --   --   --   --  37.7 35.3*  PLT 294  --   --   --   --   --   --  252 237  APTT  --    < > 29  --  46*  --   --  79* 26  LABPROT  --   --  14.1  --   --   --   --   --   --   INR  --   --  1.10  --   --   --   --   --   --   HEPARINUNFRC  --   --   --   --  1.30*  --   --  1.04* 0.30  CREATININE 1.22*  --   --   --   --   --  1.20*  --  1.17*  TROPONINI  --   --   --  <0.03  --  <0.03 <0.03  --   --    < > = values in this interval not displayed.    Estimated Creatinine Clearance: 52.1 mL/min (A) (by C-G formula based on SCr of 1.17 mg/dL (H)).   Assessment: 60 YOF on apixaban PTA for Afib. Pharmacy consulted to resume home dose. Full dose is appropriate with age, weight, and SCr. No bleeding noted.   Plan:  Apixaban 5 mg PO bid Pharmacy signing off   Thank you for allowing [place] to participate in this patients care.  Renold Genta, PharmD, BCPS Clinical Pharmacist Clinical phone for 03/25/2018 until 4p is [phone] After 4p, please call Main Rx at [phone] for assistance 03/25/2018 8:58 AM

## 2018-03-29 MED FILL — Heparin Sod (Porcine)-NaCl IV Soln 1000 Unit/500ML-0.9%: INTRAVENOUS | Qty: 1000 | Status: AC

## 2018-03-29 MED FILL — Heparin Sodium (Porcine) Inj 1000 Unit/ML: INTRAMUSCULAR | Qty: 10 | Status: AC

## 2018-03-30 ENCOUNTER — Ambulatory Visit: Payer: Medicare HMO | Admitting: Cardiology

## 2018-03-30 ENCOUNTER — Encounter: Payer: Self-pay | Admitting: Cardiology

## 2018-03-30 VITALS — BP 120/72 | HR 81 | Ht 67.0 in | Wt 245.0 lb

## 2018-03-30 DIAGNOSIS — I2510 Atherosclerotic heart disease of native coronary artery without angina pectoris: Secondary | ICD-10-CM

## 2018-03-30 NOTE — Progress Notes (Signed)
03/30/2018 Brandi Hunter   1941-04-18  [id number]  Primary Physician [name], [place] Primary Cardiologist: Dr. [name]  Reason for Visit/CC: [facility] follow-up for CAD  HPI:  Brandi Hunter is a 77 y.o. female who is being seen today for hospital follow-up after recent admission for elective cardiac catheterization.  Her past medical history is notable for paroxysmal atrial fibrillation on Eliquis, hypertension, diabetes, obesity, asthma and COPD with ongoing tobacco use as well as obstructive sleep apnea and depression.  She had a nuclear stress test in June 2018 that was negative for ischemia.  She was recently seen in clinic and complained of exertional dyspnea and substernal chest pain.  Given the patient's symptomatology it was decided to admit her to telemetry, hold Eliquis and plan for cardiac catheterization.  Cardiac catheterization was performed by Dr. 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 All of her other coronary arteries were normal.  LVEF was normal. Dr. Alvester Chou recommended medical management.  Imdur 15 mg was initiated.  She was continued on aspirin, Lipitor 40 (LDL was at goal at 51 mg/dL) and also continued on metoprolol, telmisartan and Aldactone.  She was given instructions to restart her metformin 24 hours post discharge.  Her Eliquis was also restarted the day post procedure.  She presents to clinic today for post hospital follow-up.  She is here with her son who is also 1 of our patients.  She reports that she is doing well since discharge.  She denies any recurrent chest pain.  No significant dyspnea.  She reports full medication compliance.  She reports that she is tolerating Imdur well without side effects.  She denies any issues/complications with her cath access site.  Blood pressure and heart rate are both stable.  Cardiac studies  Procedures   LEFT HEART CATH AND CORONARY ANGIOGRAPHY  Conclusion     Ost 2nd Mrg  lesion is 90% stenosed.  The left ventricular systolic function is normal.  LV end diastolic pressure is normal.  The left ventricular ejection fraction is 55-65% by visual estimate.   Left Heart   Left Ventricle The left ventricular size is normal. The left ventricular systolic function is normal. LV end diastolic pressure is normal. The left ventricular ejection fraction is 55-65% by visual estimate. No regional wall motion abnormalities.  Coronary Diagrams   Diagnostic Diagram          Current Meds  Medication Sig  . albuterol (PROAIR HFA) 108 (90 BASE) MCG/ACT inhaler Inhale 2 puffs into the lungs every 4 (four) hours as needed for wheezing. Inhale 2 puffs into the lungs every 4 (four) hours as needed.  Marland Kitchen apixaban (ELIQUIS) 5 MG TABS tablet Take 1 tablet (5 mg total) by mouth 2 (two) times daily.  Marland Kitchen aspirin 81 MG chewable tablet Chew 81 mg by mouth daily as needed for mild pain.  Marland Kitchen atorvastatin (LIPITOR) 40 MG tablet Take 40 mg by mouth daily.   . budesonide-formoterol (SYMBICORT) 160-4.5 MCG/ACT inhaler Inhale 2 puffs into the lungs 2 (two) times daily.  . fenofibrate 160 MG tablet Take 160 mg by mouth every evening.  . furosemide (LASIX) 40 MG tablet Take 1 tablet (40 mg total) by mouth daily.  Marland Kitchen glipiZIDE (GLUCOTROL XL) 10 MG 24 hr tablet Take 20 mg by mouth daily with breakfast.   . guaiFENesin (MUCINEX) 600 MG 12 hr tablet Take 600 mg by mouth 2 (two) times daily.  Marland Kitchen  ipratropium-albuterol (DUONEB) 0.5-2.5 (3) MG/3ML SOLN Take 3 mLs by nebulization 2 (two) times daily.  . isosorbide mononitrate (IMDUR) 30 MG 24 hr tablet Take 0.5 tablets (15 mg total) by mouth daily.  Marland Kitchen LORazepam (ATIVAN) 0.5 MG tablet Take 0.25-0.5 mg by mouth 2 (two) times daily as needed for anxiety.   . metFORMIN (GLUCOPHAGE) 1000 MG tablet Take 1 tablet (1,000 mg total) by mouth 2 (two) times daily with a meal.  . methocarbamol (ROBAXIN) 500 MG tablet Take 1 tablet by mouth 2 (two) times daily as  needed for muscle spasms.   . metoprolol succinate (TOPROL-XL) 100 MG 24 hr tablet TAKE 1 TABLET BY MOUTH ONCE DAILY  . oxybutynin (DITROPAN-XL) 10 MG 24 hr tablet Take 10 mg by mouth daily.   . Probiotic Product (ALIGN PO) Take 1 capsule by mouth daily.   Marland Kitchen spironolactone (ALDACTONE) 25 MG tablet Take 1 tablet (25 mg total) by mouth daily.  Marland Kitchen telmisartan (MICARDIS) 80 MG tablet Take 80 mg by mouth daily.   . traMADol (ULTRAM) 50 MG tablet Take 1 tablet by mouth every 6 (six) hours as needed for pain.  Marland Kitchen triamcinolone ointment (KENALOG) 0.5 % Apply 1 application topically 2 (two) times daily.   Allergies  Allergen Reactions  . Bee Venom Swelling  . Latex Swelling    LATEX CATHETERS   Past Medical History:  Diagnosis Date  . Asthma   . Chronic anticoagulation   . COPD (chronic obstructive pulmonary disease) ([place])   . Diabetes mellitus without complication ([place])    TYPE 2  . DJD (degenerative joint disease)   . GERD (gastroesophageal reflux disease)   . History of home oxygen therapy    oxygen 2 liters per nasal cannula at hs  . HTN (hypertension)   . Hyperlipidemia   . Mobitz (type) II atrioventricular block   . Obesity   . Pacemaker    Implanted  by Dr [name] (MDT) 10/06/10  . PAF (paroxysmal atrial fibrillation) ([place])   . Pancreatitis 2010 OR 2011   pt thought this had been ruled out  . Sleep apnea    CPA SETTING OF 14   Family History  Problem Relation Age of Onset  . Congestive Heart Failure Mother   . Congestive Heart Failure Father   . Osteoarthritis Sister   . Prostate cancer Brother   . Diabetes Brother   . Anesthesia problems Neg Hx   . Hypotension Neg Hx   . Malignant hyperthermia Neg Hx   . Pseudochol deficiency Neg Hx    Past Surgical History:  Procedure Laterality Date  . CATARACT EXTRACTION W/PHACO  11/02/2011   Procedure: CATARACT EXTRACTION PHACO AND INTRAOCULAR LENS PLACEMENT (IOC);  Surgeon: Williams Che;  Location: [facility];  Service:  Ophthalmology;  Laterality: Right;  CDE=7.33  . CATARACT EXTRACTION W/PHACO  12/07/2011   Procedure: CATARACT EXTRACTION PHACO AND INTRAOCULAR LENS PLACEMENT (IOC);  Surgeon: Williams Che, MD;  Location: [facility];  Service: Ophthalmology;  Laterality: Left;  CDE 3.61  . COLONOSCOPY WITH PROPOFOL N/A 08/09/2014   Procedure: COLONOSCOPY WITH PROPOFOL;  Surgeon: Juanita Craver, MD;  Location: [facility];  Service: Endoscopy;  Laterality: N/A;  . CYSTOCELE REPAIR    . ESOPHAGOGASTRODUODENOSCOPY (EGD) WITH PROPOFOL N/A 08/09/2014   Procedure: ESOPHAGOGASTRODUODENOSCOPY (EGD) WITH PROPOFOL;  Surgeon: Juanita Craver, MD;  Location: [facility];  Service: Endoscopy;  Laterality: N/A;  . EYE SURGERY  11/01/2012   BOTH EYES CATARACTS  . INSERT / REPLACE / REMOVE  PACEMAKER  10/06/10   MDT  implanted by Dr [name]  . KNEE ARTHROSCOPY     both  . LEFT HEART CATH AND CORONARY ANGIOGRAPHY N/A 03/24/2018   Procedure: LEFT HEART CATH AND CORONARY ANGIOGRAPHY;  Surgeon: Lorretta Harp, MD;  Location: [facility];  Service: Cardiovascular;  Laterality: N/A;  . OVARY SURGERY     removal  . REPAIR RECTOCELE    . SIMPLE MASTECTOMY WITH AXILLARY SENTINEL NODE BIOPSY Left 01/09/2015   Procedure: Irrigation and Drainage Abcess left Axilla;  Surgeon: Jackolyn Confer, MD;  Location: [facility];  Service: General;  Laterality: Left;  . TONSILLECTOMY  AGE 77  . TOTAL ABDOMINAL HYSTERECTOMY  1971   Social History   Socioeconomic History  . Marital status: Widowed    Spouse name: Not on file  . Number of children: 3  . Years of education: College  . Highest education level: Not on file  Occupational History  . 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 Financial resource strain: Not on file  . Food insecurity:    Worry: Not on file    Inability: Not on file  . Transportation needs:    Medical: Not on file    Non-medical: Not on file  Tobacco Use  . Smoking  status: Former Smoker    Packs/day: 1.00    Years: 50.00    Pack years: 50.00    Types: Cigarettes    Last attempt to quit: 09/22/2017    Years since quitting: 0.5  . Smokeless tobacco: Former Systems developer    Quit date: 11/17/2011  Substance and Sexual Activity  . Alcohol use: Yes    Alcohol/week: 0.6 oz    Types: 1 Glasses of wine per week    Comment: couple glasses of wine occasionally-once a month  . Drug use: No  . Sexual activity: Not on file  Lifestyle  . Physical activity:    Days per week: Not on file    Minutes per session: Not on file  . Stress: Not on file  Relationships  . Social connections:    Talks on phone: Not on file    Gets together: Not on file    Attends religious service: Not on file    Active member of club or organization: Not on file    Attends meetings of clubs or organizations: Not on file    Relationship status: Not on file  . Intimate partner violence:    Fear of current or ex partner: Not on file    Emotionally abused: Not on file    Physically abused: Not on file    Forced sexual activity: Not on file  Other Topics Concern  . Not on file  Social History Narrative   Patient lives at home alone.   Caffeine Use: 16oz drink daily     Review of Systems: General: negative for chills, fever, night sweats or weight changes.  Cardiovascular: negative for chest pain, dyspnea on exertion, edema, orthopnea, palpitations, paroxysmal nocturnal dyspnea or shortness of breath Dermatological: negative for rash Respiratory: negative for cough or wheezing Urologic: negative for hematuria Abdominal: negative for nausea, vomiting, diarrhea, bright red blood per rectum, melena, or hematemesis Neurologic: negative for visual changes, syncope, or dizziness All other systems reviewed and are otherwise negative except as noted above.   Physical Exam:  Blood pressure 120/72, pulse 81, height 5\' 7"  (1.702 m), weight 245 lb (111.1 kg), SpO2 97 %.  General appearance:  alert, cooperative, no distress and moderately obese Neck: no carotid bruit and no JVD Lungs: clear to auscultation bilaterally Heart: regular rate and rhythm, S1, S2 normal, no murmur, click, rub or gallop Extremities: extremities normal, atraumatic, no cyanosis or edema Pulses: 2+ and symmetric Skin: Skin color, texture, turgor normal. No rashes or lesions Neurologic: Grossly normal  EKG not performed -- personally reviewed   ASSESSMENT AND PLAN:   1. CAD: Recent cardiac catheterization showed a branch of OM2 that was 90% stenosis but otherwise normal coronary arteries and normal LV function.  Medical management was recommended and Imdur was added to her regimen.  She has done well with this without any further chest pain. No side effects with nitrate. BP stable. We will continue Imdur 15 mg along with aspirin 81, Toprol-XL 100 mg, amlodipine 5 mg, Lipitor 40 mg and telmisartan 80 mg daily.  2. PAF: Regular rate and rhythm on exam.  Heart rate controlled with beta-blocker.  She is on Eliquis for anticoagulation.  3. Lipids: LDL is at goal at 51 mg/dL.  Continue atorvastatin 40.  Triglycerides are elevated at 194.  She is on fenofibrate for this.  Will continue.  4. HTN: controlled on current regimen.   Follow-Up w/ Dr. [name] in 6 months.   Brittainy Ladoris Gene, MHS [facility] 03/30/2018 11:08 AM

## 2018-03-30 NOTE — Patient Instructions (Addendum)
Medication Instructions:   MAKE  SURE YOU TAKE ASPIRIN 37 MG ONCE A DAY   If you need a refill on your cardiac medications before your next appointment, please call your pharmacy.  Labwork: NONE ORDERED  TODAY    Testing/Procedures:  NONE ORDERED  TODAY    Follow-Up:   AS SCHEDULED WITH [name] IN DECEMBER   Any Other Special Instructions Will Be Listed Below (If Applicable).

## 2018-04-25 ENCOUNTER — Encounter: Payer: Self-pay | Admitting: Pulmonary Disease

## 2018-04-25 ENCOUNTER — Ambulatory Visit: Payer: Medicare HMO | Admitting: Pulmonary Disease

## 2018-04-25 VITALS — BP 140/90 | HR 86 | Ht 66.0 in | Wt 251.8 lb

## 2018-04-25 DIAGNOSIS — J441 Chronic obstructive pulmonary disease with (acute) exacerbation: Secondary | ICD-10-CM

## 2018-04-25 DIAGNOSIS — Z9989 Dependence on other enabling machines and devices: Secondary | ICD-10-CM | POA: Diagnosis not present

## 2018-04-25 DIAGNOSIS — J9610 Chronic respiratory failure, unspecified whether with hypoxia or hypercapnia: Secondary | ICD-10-CM

## 2018-04-25 DIAGNOSIS — G4733 Obstructive sleep apnea (adult) (pediatric): Secondary | ICD-10-CM | POA: Diagnosis not present

## 2018-04-25 DIAGNOSIS — E662 Morbid (severe) obesity with alveolar hypoventilation: Secondary | ICD-10-CM

## 2018-04-25 DIAGNOSIS — J449 Chronic obstructive pulmonary disease, unspecified: Secondary | ICD-10-CM | POA: Diagnosis not present

## 2018-04-25 MED ORDER — PREDNISONE 10 MG PO TABS: ORAL_TABLET | ORAL | 0 refills | Status: DC

## 2018-04-25 NOTE — Patient Instructions (Signed)
Prednisone 10 mg pill >> 3 pills daily for 2 days, 2 pills daily for 2 days, 1 pill daily for 2 days ? ?Follow up in 6 months ?

## 2018-04-25 NOTE — Progress Notes (Signed)
[facility]  Chief Complaint  Patient presents with  . Follow-up    Pt is doing well overall with cpap machine. Pt has dry cough, chest tightness, and increase of SOB with exertion. Pt O2 was 86% when walking into the room from lobby today. Recovered to 90% on rm air with few seconds. Pt uses 2 liters of O2 at bedtime.    Vital signs: BP 140/90 (BP Location: Left Arm, Cuff Size: Normal)   Pulse 86   Ht 5\' 6"  (1.676 m)   Wt 251 lb 12.8 oz (114.2 kg)   SpO2 90%   BMI 40.64 kg/m   History of Present Illness: Brandi Hunter is a 77 y.o. female former smoker with COPD/asthm, OSA, and OHS.  She was in hospital after having chest pain.  Had heart cath.  Started on imdur.  Was feeling better until past week.  Has more dyspnea, cough, wheeze, clear sputum.  No having sinus congestion, sore throat, fever, hemoptysis, swelling, abdominal pain.  Uses CPAP night with oxygen.  No issues with mask fit.  She was in hospital in November for bronchitis.  Stopped smoking after that.  Physical Exam:  General - pleasant Eyes - pupils reactive ENT - no sinus tenderness, no oral exudate, no LAN Cardiac - regular, no murmur Chest - no wheeze, rales Abd - soft, non tender Ext - no edema Skin - no rashes Neuro - normal strength Psych - normal mood  Dg Chest 2 View  Result Date: 03/23/2018 CLINICAL DATA:  Central chest pain and burning since last night. EXAM: CHEST - 2 VIEW COMPARISON:  09/26/2017.  09/27/2017. FINDINGS: Heart size upper limits of normal. Dual lead pacemaker in place with leads in the region of the right atrium and right ventricle. Pulmonary vascularity is normal. There is elevation of the right hemidiaphragm with chronic volume loss in the right lower lung. No effusions. No acute bone finding. IMPRESSION: Chronic elevation of the right hemidiaphragm with chronic volume loss at the right base. No active finding otherwise. Electronically Signed   By:  Nelson Chimes M.D.   On: 03/23/2018 10:06    Assessment/Plan:  COPD exacerbation. - don't think she needs ABx or CXR at this time - prednisone taper  Obstructive sleep apnea. - she is compliant with CPAP and reports benefit - continue auto CPAP  Chronic respiratory failure with obesity hypoventilation syndrome. - continue 2 liters oxygen at night  COPD with asthma. - continue symbicort with prn albuterol   Patient Instructions  Prednisone 10 mg pill >> 3 pills daily for 2 days, 2 pills daily for 2 days, 1 pill daily for 2 days  Follow up in 6 months    Chesley Mires, MD [place] 04/25/2018, 10:23 AM  Flow Sheet  Sleep tests: PSG 11/18/08 >> AHI 9 Auto CPAP  03/26/18 to 04/24/18 >> used on 30 of 30 nights with average 8 hrs 8 min.  Average AHI 0.7 with median CPAP 6 and 95 th percentile CPAP 9 cm H2O  Pulmonary tests PFT 09/27/12 >> FEV1 1.81 (79%), FEV1% 65, TLC 4.64 (86%), DLCO 77%, no BD CT chest 05/23/14 >> 5 mm nodule Rt lower lung stable since 2008  Cardiac tests Echo 10/22/17 >> EF 60 to 65%, grade 2 DD, PAS 37 mmHg  Past Medical History: She  has a past medical history of Asthma, Chronic anticoagulation, COPD (chronic obstructive pulmonary disease) ([place]), Diabetes mellitus without complication ([place]), DJD (degenerative joint  disease), GERD (gastroesophageal reflux disease), History of home oxygen therapy, HTN (hypertension), Hyperlipidemia, Mobitz (type) II atrioventricular block, Obesity, Pacemaker, PAF (paroxysmal atrial fibrillation) ([place]), Pancreatitis (2010 OR 2011), and Sleep apnea.  Past Surgical History: She  has a past surgical history that includes Insert / replace / remove pacemaker (10/06/10); Total abdominal hysterectomy (1971); Cystocele repair; [place] surgery; Repair rectocele; Knee arthroscopy; Cataract extraction w/PHACO (11/02/2011); Cataract extraction w/PHACO (12/07/2011); Eye surgery (11/01/2012); Tonsillectomy (AGE 14);  Colonoscopy with propofol (N/A, 08/09/2014); Esophagogastroduodenoscopy (egd) with propofol (N/A, 08/09/2014); Simple mastectomy with axillary sentinel node biopsy (Left, 01/09/2015); and LEFT HEART CATH AND CORONARY ANGIOGRAPHY (N/A, 03/24/2018).  Family History: Her family history includes Congestive Heart Failure in her father and mother; Diabetes in her brother; Osteoarthritis in her sister; Prostate cancer in her brother.  Social History: She  reports that she quit smoking about 7 months ago. Her smoking use included cigarettes. She has a 50.00 pack-year smoking history. She quit smokeless tobacco use about 6 years ago. She reports that she drinks about 0.6 oz of alcohol per week. She reports that she does not use drugs.  Medications: Allergies as of 04/25/2018      Reactions   Bee Venom Swelling   Latex Swelling   LATEX CATHETERS      Medication List        Accurate as of 04/25/18 10:23 AM. Always use your most recent med list.          ALIGN PO Take 1 capsule by mouth daily.   amLODipine 5 MG tablet Commonly known as:  NORVASC Take 1 tablet (5 mg total) by mouth daily.   apixaban 5 MG Tabs tablet Commonly known as:  ELIQUIS Take 1 tablet (5 mg total) by mouth 2 (two) times daily.   aspirin 81 MG chewable tablet Chew 81 mg by mouth daily.   atorvastatin 40 MG tablet Commonly known as:  LIPITOR Take 40 mg by mouth daily.   budesonide-formoterol 160-4.5 MCG/ACT inhaler Commonly known as:  SYMBICORT Inhale 2 puffs into the lungs 2 (two) times daily.   fenofibrate 160 MG tablet Take 160 mg by mouth every evening.   furosemide 40 MG tablet Commonly known as:  LASIX Take 1 tablet (40 mg total) by mouth daily.   glipiZIDE 10 MG 24 hr tablet Commonly known as:  GLUCOTROL XL Take 20 mg by mouth daily with breakfast.   guaiFENesin 600 MG 12 hr tablet Commonly known as:  MUCINEX Take 600 mg by mouth 2 (two) times daily.   ipratropium-albuterol 0.5-2.5 (3) MG/3ML  Soln Commonly known as:  DUONEB Take 3 mLs by nebulization 2 (two) times daily.   isosorbide mononitrate 30 MG 24 hr tablet Commonly known as:  IMDUR Take 0.5 tablets (15 mg total) by mouth daily.   LORazepam 0.5 MG tablet Commonly known as:  ATIVAN Take 0.25-0.5 mg by mouth 2 (two) times daily as needed for anxiety.   metFORMIN 1000 MG tablet Commonly known as:  GLUCOPHAGE Take 1 tablet (1,000 mg total) by mouth 2 (two) times daily with a meal.   methocarbamol 500 MG tablet Commonly known as:  ROBAXIN Take 1 tablet by mouth 2 (two) times daily as needed for muscle spasms.   metoprolol succinate 100 MG 24 hr tablet Commonly known as:  TOPROL-XL TAKE 1 TABLET BY MOUTH ONCE DAILY   nitroGLYCERIN 0.4 MG SL tablet Commonly known as:  NITROSTAT Place 1 tablet (0.4 mg total) under the tongue every 5 (five) minutes as needed  for chest pain.   oxybutynin 10 MG 24 hr tablet Commonly known as:  DITROPAN-XL Take 10 mg by mouth daily.   predniSONE 10 MG tablet Commonly known as:  DELTASONE 3 pills daily for 2 days, 2 pills daily for 2 days, 1 pill daily for 2 days   PROAIR HFA 108 (90 Base) MCG/ACT inhaler Generic drug:  albuterol Inhale 2 puffs into the lungs every 4 (four) hours as needed for wheezing. Inhale 2 puffs into the lungs every 4 (four) hours as needed.   spironolactone 25 MG tablet Commonly known as:  ALDACTONE Take 1 tablet (25 mg total) by mouth daily.   telmisartan 80 MG tablet Commonly known as:  MICARDIS Take 80 mg by mouth daily.   traMADol 50 MG tablet Commonly known as:  ULTRAM Take 1 tablet by mouth every 6 (six) hours as needed for pain.   triamcinolone ointment 0.5 % Commonly known as:  KENALOG Apply 1 application topically 2 (two) times daily.

## 2018-05-03 ENCOUNTER — Ambulatory Visit: Payer: Medicare Other | Admitting: Nurse Practitioner

## 2018-05-11 ENCOUNTER — Other Ambulatory Visit: Payer: Self-pay | Admitting: Acute Care

## 2018-05-11 DIAGNOSIS — Z87891 Personal history of nicotine dependence: Secondary | ICD-10-CM

## 2018-05-11 DIAGNOSIS — Z122 Encounter for screening for malignant neoplasm of respiratory organs: Secondary | ICD-10-CM

## 2018-05-12 DIAGNOSIS — M51369 Other intervertebral disc degeneration, lumbar region without mention of lumbar back pain or lower extremity pain: Secondary | ICD-10-CM | POA: Insufficient documentation

## 2018-05-23 ENCOUNTER — Telehealth: Payer: Self-pay

## 2018-05-23 NOTE — Telephone Encounter (Signed)
Pharm please address eliquis 

## 2018-05-23 NOTE — Telephone Encounter (Signed)
   [facility] Pre-operative Risk Assessment    Request for surgical clearance:  1. What type of surgery is being performed? ESI injection  2. When is this surgery scheduled? 05/31/2018   3. What type of clearance is required (medical clearance vs. Pharmacy clearance to hold med vs. Both)? pharmacy  4. Are there any medications that need to be held prior to surgery and how long? Eliquis x 3 days   5. Practice name and name of physician performing surgery? [facility], phycisian not listed   6. What is your office phone number? [place]   What is your office fax number? [phone]   8.   Anesthesia type (None, local, MAC, general) ? Not listed    Joaquim Lai 05/23/2018, 10:40 AM  _________________________________________________________________   (provider comments below)

## 2018-05-24 NOTE — Telephone Encounter (Signed)
Patient with diagnosis of Afib on Eliquis for anticoagulation.    Procedure: ESI injection /Date of procedure:05/31/18  CHADS2-VASc score of  7 (CHF, HTN, AGE, DM2, stroke/tia x 2, CAD, AGE, female)  CrCl 39ml/min  Will route to physician for advisement on length of hold with spinal procedure and high CV risk. Generally hold for 3 days for spinal procedure, but if pt were on warfarin would meet criteria for bridge given high CHADSVASc.

## 2018-05-25 NOTE — Telephone Encounter (Signed)
Would bridge if necessary given prior stroke.  I worry about interruption of anticoagulation.  I would advise holding the minimal amount of time required and will defer this to the discretion of the operator.Brandi Hunter

## 2018-05-26 ENCOUNTER — Ambulatory Visit (INDEPENDENT_AMBULATORY_CARE_PROVIDER_SITE_OTHER): Payer: Medicare HMO | Admitting: Pharmacist

## 2018-05-26 ENCOUNTER — Encounter (INDEPENDENT_AMBULATORY_CARE_PROVIDER_SITE_OTHER): Payer: Self-pay

## 2018-05-26 DIAGNOSIS — I480 Paroxysmal atrial fibrillation: Secondary | ICD-10-CM

## 2018-05-26 MED ORDER — ENOXAPARIN SODIUM 120 MG/0.8ML ~~LOC~~ SOLN: 120.0000 mg | Freq: Two times a day (BID) | SUBCUTANEOUS | 0 refills | Status: DC

## 2018-05-26 NOTE — Telephone Encounter (Signed)
Pt will need to hold Eliquis for 3 days prior to spinal injection. Will coordinate Lovenox bridge in clinic this afternoon. Pt is aware.

## 2018-05-26 NOTE — Progress Notes (Signed)
Pt here for teaching on Lovenox injection around procedure while Eliquis on hold. Pt weight - 114kg, Crcl 12ml/min.   05/28/18: Inject Lovenox (enoxaparin) 120mg  in the fatty tissue at 8am and 8pm. No Eliquis.  05/29/18: Inject Lovenox in the fatty tissue at 8am and 8pm. No Eliquis  05/30/18: Inject Lovenox 120mg  at 8am. No PM dose of Lovenox - No Eliquis  05/31/18: Procedure Day - No Lovenox - No Eliquis  06/01/18: Resume Eliquis unless otherwise instructed

## 2018-05-26 NOTE — Patient Instructions (Addendum)
05/28/18: Inject Lovenox (enoxaparin) 120mg  in the fatty tissue at 8am and 8pm. No Eliquis.  05/29/18: Inject Lovenox in the fatty tissue at 8am and 8pm. No Eliquis  05/30/18: Inject Lovenox 120mg  at 8am. No PM dose of Lovenox - No Eliquis  05/31/18: Procedure Day - No Lovenox - No Eliquis  06/01/18: Resume Eliquis unless otherwise instructed

## 2018-05-26 NOTE — Telephone Encounter (Signed)
   Primary Cardiologist: Thompson Grayer, MD  Chart reviewed as part of pre-operative protocol coverage. Patient was contacted 05/26/2018 in reference to pre-operative risk assessment for pending surgery as outlined below.  Jezreel Sisk Vanstone was last seen on 03/30/18 by Lyda Jester, PA.  Since that day, COLLEEN KOTLARZ has done well with no new cardiac complaints. 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 She has hx of prior stroke. Dr. [name] advises holding anticoagulation the minimum amount of time required  According to our pharmacy protocol: Pt will need to hold Eliquis for 3 days prior to spinal injection. Will coordinate Lovenox bridge in clinic this afternoon. Pt is aware.  Therefore, based on ACC/AHA guidelines, the patient would be at acceptable risk for the planned procedure without further cardiovascular testing.   I will route this recommendation to the requesting party via Epic fax function and remove from pre-op pool.  Please call with questions.  Daune Perch, NP 05/26/2018, 2:25 PM

## 2018-05-27 ENCOUNTER — Telehealth: Payer: Self-pay

## 2018-05-27 ENCOUNTER — Other Ambulatory Visit: Payer: Self-pay | Admitting: Pharmacist

## 2018-05-27 MED ORDER — ENOXAPARIN SODIUM 120 MG/0.8ML ~~LOC~~ SOLN: 120.0000 mg | Freq: Two times a day (BID) | SUBCUTANEOUS | 0 refills | Status: DC

## 2018-05-27 NOTE — Telephone Encounter (Signed)
Letter received via fax from [facility] stating that they have approved the pts Enoxaparin PA. Approval good until 05/27/19.  I have notified the pts pharmacy.

## 2018-05-27 NOTE — Telephone Encounter (Signed)
I have done a Enoxaparin PA through covermymeds. Key: [id number]

## 2018-05-27 NOTE — Progress Notes (Signed)
Called 3 pharmacies who do not have Lovenox in stock and cannot get it by tomorrow. [facility] [place] [facility], rx sent there. Prior auth for enoxaparin has been approved. Pt is aware.

## 2018-05-30 ENCOUNTER — Telehealth: Payer: Self-pay | Admitting: Pharmacist

## 2018-05-30 NOTE — Progress Notes (Signed)
[facility] Report   Patient Details  Name: Brandi Hunter MRN: [id number] Date of Birth: 1941/02/16 Age: 77 y.o. PCP: [name], FNP  Vitals:   05/30/18 1456  BP: 130/80  Pulse: 77  Resp: 18  SpO2: 97%  Weight: 248 lb 6.4 oz (112.7 kg)     [facility] - 05/30/18 1500      Referral    Referring Provider  AFIB clinic    Reason for referral  Obesitity/Overweight;Hypertension;High Cholesterol;Inactivity;Diabetes;Orthopedic    Program Start Date  06/08/18      Measurement   Waist Circumference  45 inches    Hip Circumference  51 inches    Body fat  47.9 percent      Information for Trainer   Goals  "more mobility, stability, to gain independence back, to be able to balance on each foot for 10 seconds, unassisted."    Current Exercise  none    Orthopedic Concerns  back arthritis, lt knee pain,     Pertinent Medical History  DM 2, CHF, AFIB, OSA    Current Barriers  Breathing    Restrictions/Precautions  Fall risk    Medications that affect exercise  Beta blocker;Oxgen;Asthma inhaler      Timed Up and Go (TUGS)   Timed Up and Go  Moderate risk 10-12 seconds 11.63   11.63     Mobility and Daily Activities   I find it easy to walk up or down two or more flights of stairs.  1    I have no trouble taking out the trash.  1    I do housework such as vacuuming and dusting on my own without difficulty.  1    I can easily lift a gallon of milk (8lbs).  4    I can easily walk a mile.  1    I have no trouble reaching into high cupboards or reaching down to pick up something from the floor.  3    I do not have trouble doing out-door work such as Armed forces logistics/support/administrative officer, raking leaves, or gardening.  1      Mobility and Daily Activities   I feel younger than my age.  2    I feel independent.  3    I feel energetic.  2    I live an active life.   2    I feel strong.  2    I feel healthy.  2    I feel active as other people my age.  2      How fit and strong are  you.   Fit and Strong Total Score  27      Past Medical History:  Diagnosis Date  . Asthma   . Chronic anticoagulation   . COPD (chronic obstructive pulmonary disease) ([place])   . Diabetes mellitus without complication ([place])    TYPE 2  . DJD (degenerative joint disease)   . GERD (gastroesophageal reflux disease)   . History of home oxygen therapy    oxygen 2 liters per nasal cannula at hs  . HTN (hypertension)   . Hyperlipidemia   . Mobitz (type) II atrioventricular block   . Obesity   . Pacemaker    Implanted  by Dr [name] (MDT) 10/06/10  . PAF (paroxysmal atrial fibrillation) ([place])   . Pancreatitis 2010 OR 2011   pt thought this had been ruled out  . Sleep apnea    CPA SETTING OF 14  Past Surgical History:  Procedure Laterality Date  . CATARACT EXTRACTION W/PHACO  11/02/2011   Procedure: CATARACT EXTRACTION PHACO AND INTRAOCULAR LENS PLACEMENT (IOC);  Surgeon: Williams Che;  Location: [facility];  Service: Ophthalmology;  Laterality: Right;  CDE=7.33  . CATARACT EXTRACTION W/PHACO  12/07/2011   Procedure: CATARACT EXTRACTION PHACO AND INTRAOCULAR LENS PLACEMENT (IOC);  Surgeon: Williams Che, MD;  Location: [facility];  Service: Ophthalmology;  Laterality: Left;  CDE 3.61  . COLONOSCOPY WITH PROPOFOL N/A 08/09/2014   Procedure: COLONOSCOPY WITH PROPOFOL;  Surgeon: Juanita Craver, MD;  Location: [facility];  Service: Endoscopy;  Laterality: N/A;  . CYSTOCELE REPAIR    . ESOPHAGOGASTRODUODENOSCOPY (EGD) WITH PROPOFOL N/A 08/09/2014   Procedure: ESOPHAGOGASTRODUODENOSCOPY (EGD) WITH PROPOFOL;  Surgeon: Juanita Craver, MD;  Location: [facility];  Service: Endoscopy;  Laterality: N/A;  . EYE SURGERY  11/01/2012   BOTH EYES CATARACTS  . INSERT / REPLACE / REMOVE PACEMAKER  10/06/10   MDT  implanted by Dr [name]  . KNEE ARTHROSCOPY     both  . LEFT HEART CATH AND CORONARY ANGIOGRAPHY N/A 03/24/2018   Procedure: LEFT HEART CATH AND CORONARY ANGIOGRAPHY;  Surgeon: Lorretta Harp,  MD;  Location: [facility];  Service: Cardiovascular;  Laterality: N/A;  . OVARY SURGERY     removal  . REPAIR RECTOCELE    . SIMPLE MASTECTOMY WITH AXILLARY SENTINEL NODE BIOPSY Left 01/09/2015   Procedure: Irrigation and Drainage Abcess left Axilla;  Surgeon: Jackolyn Confer, MD;  Location: [facility];  Service: General;  Laterality: Left;  . TONSILLECTOMY  AGE 43  . TOTAL ABDOMINAL HYSTERECTOMY  1971   Social History   Tobacco Use  Smoking Status Former Smoker  . Packs/day: 1.00  . Years: 50.00  . Pack years: 50.00  . Types: Cigarettes  . Last attempt to quit: 09/22/2017  . Years since quitting: 0.6  Smokeless Tobacco Former Systems developer  . Quit date: 11/17/2011     Ms. Leisure is set to begin the PREP at [facility] every Wed/Fri 11:00-noon x 12 weeks starting July 24.     [name] 05/30/2018, 3:07 PM

## 2018-05-30 NOTE — Telephone Encounter (Signed)
Pt called to report she forgot her AM Lovenox dose. Advised her to inject 1/2 of her syringe when she gets home in 20 minute. This will still provide some protection as she will receive 60mg  instead of the full 120mg . Avoiding full dose since procedure is tomorrow AM at 8:30am.

## 2018-05-31 ENCOUNTER — Telehealth: Payer: Self-pay

## 2018-05-31 ENCOUNTER — Ambulatory Visit (INDEPENDENT_AMBULATORY_CARE_PROVIDER_SITE_OTHER): Payer: Medicare HMO | Admitting: *Deleted

## 2018-05-31 DIAGNOSIS — I441 Atrioventricular block, second degree: Secondary | ICD-10-CM

## 2018-05-31 NOTE — Telephone Encounter (Signed)
Spoke with pt and reminded pt of remote transmission that is due today. Pt verbalized understanding.   

## 2018-05-31 NOTE — Progress Notes (Signed)
Remote pacemaker transmission.   

## 2018-06-01 ENCOUNTER — Ambulatory Visit (INDEPENDENT_AMBULATORY_CARE_PROVIDER_SITE_OTHER)
Admission: RE | Admit: 2018-06-01 | Discharge: 2018-06-01 | Disposition: A | Discharge status: Home / Self Care | Payer: Medicare HMO | Source: Ambulatory Visit | Attending: Acute Care | Admitting: Acute Care

## 2018-06-01 ENCOUNTER — Encounter: Payer: Self-pay | Admitting: Acute Care

## 2018-06-01 ENCOUNTER — Ambulatory Visit (INDEPENDENT_AMBULATORY_CARE_PROVIDER_SITE_OTHER): Payer: Medicare HMO | Admitting: Acute Care

## 2018-06-01 ENCOUNTER — Encounter: Payer: Self-pay | Admitting: Cardiology

## 2018-06-01 DIAGNOSIS — Z87891 Personal history of nicotine dependence: Secondary | ICD-10-CM | POA: Diagnosis not present

## 2018-06-01 DIAGNOSIS — Z122 Encounter for screening for malignant neoplasm of respiratory organs: Secondary | ICD-10-CM

## 2018-06-01 NOTE — Progress Notes (Signed)
Shared Decision Making Visit Lung Cancer Screening Program [phone])   Eligibility:  Age 77 y.o.  Pack Years Smoking History Calculation 54 pack year smoking history (# packs/per year x # years smoked)  Recent History of coughing up blood  no  Unexplained weight loss? no ( >Than 15 pounds within the last 6 months )  Prior History Lung / other cancer no (Diagnosis within the last 5 years already requiring surveillance chest CT Scans).  Smoking Status Former Smoker  Former Smokers: Years since quit: < 1 year  Quit Date: 09/2017  Visit Components:  Discussion included one or more decision making aids. yes  Discussion included risk/benefits of screening. yes  Discussion included potential follow up diagnostic testing for abnormal scans. yes  Discussion included meaning and risk of over diagnosis. yes  Discussion included meaning and risk of False Positives. yes  Discussion included meaning of total radiation exposure. yes  Counseling Included:  Importance of adherence to annual lung cancer LDCT screening. yes  Impact of comorbidities on ability to participate in the program. yes  Ability and willingness to under diagnostic treatment. yes  Smoking Cessation Counseling:  Current Smokers:   Discussed importance of smoking cessation. NA former smoker  Information about tobacco cessation classes and interventions provided to patient. yes  Patient provided with "ticket" for LDCT Scan. yes  Symptomatic Patient. no  Counseling  Diagnosis Code: Tobacco Use Z72.0  Asymptomatic Patient yes  Counseling (Intermediate counseling: > three minutes counseling) K9983  Former Smokers:   Discussed the importance of maintaining cigarette abstinence. yes  Diagnosis Code: Personal History of Nicotine Dependence. J82.505  Information about tobacco cessation classes and interventions provided to patient. Yes  Patient provided with "ticket" for LDCT Scan. yes  Written Order  for Lung Cancer Screening with LDCT placed in Epic. Yes (CT Chest Lung Cancer Screening Low Dose W/O CM) [id number] Z12.2-Screening of respiratory organs Z87.891-Personal history of nicotine dependence   I spent 25 minutes of face to face time with Brandi Hunter discussing the risks and benefits of lung cancer screening. We viewed a power point together that explained in detail the above noted topics. We took the time to pause the power point at intervals to allow for questions to be asked and answered to ensure understanding. We discussed that she had taken the single most powerful action possible to decrease her risk of developing lung cancer when she quit smoking. I counseled her to remain smoke free, and to contact me if she ever had the desire to smoke again so that I can provide resources and tools to help support the effort to remain smoke free. We discussed the time and location of the scan, and that either  Brandi Glassman RN or I will call with the results within  24-48 hours of receiving them. She has my card and contact information in the event she needs to speak with me, in addition to a copy of the power point we reviewed as a resource. She verbalized understanding of all of the above and had no further questions upon leaving the office.     I explained to the patient that there has been a high incidence of coronary artery disease noted on these exams. I explained that this is a non-gated exam therefore degree or severity cannot be determined. This patient is currently on statin therapy. I have asked the patient to follow-up with their PCP regarding any incidental finding of coronary artery disease and management with diet or medication as  they feel is clinically indicated. The patient verbalized understanding of the above and had no further questions.    Magdalen Spatz, NP 06/01/2018  10:30 AM

## 2018-06-03 LAB — CUP PACEART REMOTE DEVICE CHECK
Battery Impedance: 697 Ohm
Battery Remaining Longevity: 65 mo
Battery Voltage: 2.77 V
Brady Statistic AP VP Percent: 86 %
Brady Statistic AP VS Percent: 0 %
Implantable Lead Implant Date: 20111121
Implantable Lead Implant Date: 20111121
Implantable Lead Location: 753860
Implantable Lead Model: 4469
Implantable Lead Serial Number: 548226
Implantable Pulse Generator Implant Date: 20111121
Lead Channel Impedance Value: 425 Ohm
Lead Channel Pacing Threshold Pulse Width: 0.4 ms
Lead Channel Setting Pacing Amplitude: 2 V
Lead Channel Setting Pacing Pulse Width: 0.4 ms
Lead Channel Setting Sensing Sensitivity: 4 mV
MDC IDC LEAD LOCATION: 753859
MDC IDC LEAD SERIAL: 687643
MDC IDC MSMT LEADCHNL RV IMPEDANCE VALUE: 492 Ohm
MDC IDC MSMT LEADCHNL RV PACING THRESHOLD AMPLITUDE: 0.875 V
MDC IDC SESS DTM: 20190716171756
MDC IDC SET LEADCHNL RV PACING AMPLITUDE: 2.5 V
MDC IDC STAT BRADY AS VP PERCENT: 10 %
MDC IDC STAT BRADY AS VS PERCENT: 4 %

## 2018-06-07 ENCOUNTER — Telehealth: Payer: Self-pay | Admitting: Acute Care

## 2018-06-07 DIAGNOSIS — Z122 Encounter for screening for malignant neoplasm of respiratory organs: Secondary | ICD-10-CM

## 2018-06-07 DIAGNOSIS — Z87891 Personal history of nicotine dependence: Secondary | ICD-10-CM

## 2018-06-07 NOTE — Telephone Encounter (Signed)
Pt informed of CT results per Sarah Groce, NP.  PT verbalized understanding.  Copy sent to PCP.  Order placed for 1 yr f/u CT.  

## 2018-06-15 NOTE — Progress Notes (Signed)
[facility] PREP Weekly Session   Patient Details  Name: Brandi Hunter MRN: [id number] Date of Birth: May 10, 1941 Age: 77 y.o. PCP: [name], FNP  Vitals:   06/15/18 1357  Weight: 250 lb 12.8 oz (113.8 kg)    [facility] Weekly seesion - 06/15/18 1300      Weekly Session   Topic Discussed  Other ways to be active    Minutes exercised this week  15 minutes cardio   cardio   Classes attended to date  1      Fun things you did since last meeting:"celebrated son's birthday" Things you are grateful for:"life & friends" Nutrition celebrations:"ate more salad" Barriers:"food"  [name] 06/15/2018, 1:58 PM

## 2018-06-22 NOTE — Progress Notes (Signed)
[facility] PREP Weekly Session   Patient Details  Name: Brandi Hunter MRN: [id number] Date of Birth: Aug 04, 1941 Age: 77 y.o. PCP: [name], FNP  Vitals:   06/22/18 1530  Weight: 245 lb 9.6 oz (111.4 kg)    [facility] Weekly seesion - 06/22/18 1500      Weekly Session   Topic Discussed  Healthy eating tips    Minutes exercised this week  180 minutes 60cardio/60strength/51flexibility   60cardio/60strength/26flexibility     Fun things you did since last meeting:"[employer]" Things you are grateful for:"friends & Family" Nutrition celebrations:"not as hungry"  [name] 06/22/2018, 3:30 PM

## 2018-06-29 NOTE — Progress Notes (Signed)
[facility] PREP Weekly Session   Patient Details  Name: Brandi Hunter MRN: [id number] Date of Birth: January 22, 1941 Age: 77 y.o. PCP: [name], FNP  Vitals:   06/29/18 1318  Weight: 246 lb (111.6 kg)    [facility] Weekly seesion - 06/29/18 1300      Weekly Session   Topic Discussed  Health habits    Minutes exercised this week  150 minutes   60cardio/60strength/68flexibility     Things you are grateful for:"family, friends, God" Nutrition celebrations:"ate no ice cream" Barriers:"pain in leg, headaches, UTI"  [name] 06/29/2018, 1:19 PM

## 2018-06-30 ENCOUNTER — Inpatient Hospital Stay (HOSPITAL_COMMUNITY)
Admission: EM | Admit: 2018-06-30 | Discharge: 2018-07-02 | DRG: 872 | Disposition: A | Discharge status: Home / Self Care | Payer: Medicare Other | Attending: Internal Medicine | Admitting: Internal Medicine

## 2018-06-30 ENCOUNTER — Emergency Department (HOSPITAL_COMMUNITY): Payer: Medicare Other

## 2018-06-30 ENCOUNTER — Encounter (HOSPITAL_COMMUNITY): Payer: Self-pay | Admitting: Emergency Medicine

## 2018-06-30 DIAGNOSIS — N390 Urinary tract infection, site not specified: Secondary | ICD-10-CM | POA: Diagnosis not present

## 2018-06-30 DIAGNOSIS — K219 Gastro-esophageal reflux disease without esophagitis: Secondary | ICD-10-CM | POA: Diagnosis not present

## 2018-06-30 DIAGNOSIS — J449 Chronic obstructive pulmonary disease, unspecified: Secondary | ICD-10-CM | POA: Diagnosis present

## 2018-06-30 DIAGNOSIS — Z950 Presence of cardiac pacemaker: Secondary | ICD-10-CM

## 2018-06-30 DIAGNOSIS — G4730 Sleep apnea, unspecified: Secondary | ICD-10-CM | POA: Diagnosis not present

## 2018-06-30 DIAGNOSIS — I480 Paroxysmal atrial fibrillation: Secondary | ICD-10-CM | POA: Diagnosis present

## 2018-06-30 DIAGNOSIS — E785 Hyperlipidemia, unspecified: Secondary | ICD-10-CM | POA: Diagnosis present

## 2018-06-30 DIAGNOSIS — A4151 Sepsis due to Escherichia coli [E. coli]: Secondary | ICD-10-CM | POA: Diagnosis not present

## 2018-06-30 DIAGNOSIS — E860 Dehydration: Secondary | ICD-10-CM | POA: Diagnosis present

## 2018-06-30 DIAGNOSIS — Z7951 Long term (current) use of inhaled steroids: Secondary | ICD-10-CM

## 2018-06-30 DIAGNOSIS — E119 Type 2 diabetes mellitus without complications: Secondary | ICD-10-CM | POA: Diagnosis present

## 2018-06-30 DIAGNOSIS — N179 Acute kidney failure, unspecified: Secondary | ICD-10-CM

## 2018-06-30 DIAGNOSIS — Z7982 Long term (current) use of aspirin: Secondary | ICD-10-CM

## 2018-06-30 DIAGNOSIS — Z9981 Dependence on supplemental oxygen: Secondary | ICD-10-CM | POA: Diagnosis not present

## 2018-06-30 DIAGNOSIS — I10 Essential (primary) hypertension: Secondary | ICD-10-CM | POA: Diagnosis present

## 2018-06-30 DIAGNOSIS — D649 Anemia, unspecified: Secondary | ICD-10-CM

## 2018-06-30 DIAGNOSIS — Z1629 Resistance to other single specified antibiotic: Secondary | ICD-10-CM | POA: Diagnosis not present

## 2018-06-30 DIAGNOSIS — E669 Obesity, unspecified: Secondary | ICD-10-CM | POA: Diagnosis not present

## 2018-06-30 DIAGNOSIS — I441 Atrioventricular block, second degree: Secondary | ICD-10-CM | POA: Diagnosis present

## 2018-06-30 DIAGNOSIS — Z6838 Body mass index (BMI) 38.0-38.9, adult: Secondary | ICD-10-CM | POA: Diagnosis not present

## 2018-06-30 DIAGNOSIS — I110 Hypertensive heart disease with heart failure: Secondary | ICD-10-CM | POA: Diagnosis not present

## 2018-06-30 DIAGNOSIS — Z79899 Other long term (current) drug therapy: Secondary | ICD-10-CM

## 2018-06-30 DIAGNOSIS — Z7901 Long term (current) use of anticoagulants: Secondary | ICD-10-CM

## 2018-06-30 DIAGNOSIS — E861 Hypovolemia: Secondary | ICD-10-CM | POA: Diagnosis present

## 2018-06-30 DIAGNOSIS — I959 Hypotension, unspecified: Secondary | ICD-10-CM | POA: Diagnosis present

## 2018-06-30 DIAGNOSIS — I5032 Chronic diastolic (congestive) heart failure: Secondary | ICD-10-CM | POA: Diagnosis present

## 2018-06-30 DIAGNOSIS — A419 Sepsis, unspecified organism: Secondary | ICD-10-CM | POA: Diagnosis present

## 2018-06-30 DIAGNOSIS — Z87891 Personal history of nicotine dependence: Secondary | ICD-10-CM

## 2018-06-30 DIAGNOSIS — I5033 Acute on chronic diastolic (congestive) heart failure: Secondary | ICD-10-CM | POA: Diagnosis present

## 2018-06-30 DIAGNOSIS — G4734 Idiopathic sleep related nonobstructive alveolar hypoventilation: Secondary | ICD-10-CM | POA: Diagnosis present

## 2018-06-30 DIAGNOSIS — R0902 Hypoxemia: Secondary | ICD-10-CM | POA: Diagnosis present

## 2018-06-30 LAB — COMPREHENSIVE METABOLIC PANEL
ALK PHOS: 20 U/L — AB (ref 38–126)
ALT: 14 U/L (ref 0–44)
ANION GAP: 8 (ref 5–15)
AST: 21 U/L (ref 15–41)
Albumin: 2.9 g/dL — ABNORMAL LOW (ref 3.5–5.0)
BUN: 17 mg/dL (ref 8–23)
CALCIUM: 8.6 mg/dL — AB (ref 8.9–10.3)
CHLORIDE: 108 mmol/L (ref 98–111)
CO2: 25 mmol/L (ref 22–32)
CREATININE: 1.32 mg/dL — AB (ref 0.44–1.00)
GFR, EST AFRICAN AMERICAN: 45 mL/min — AB (ref >60)
GFR, EST NON AFRICAN AMERICAN: 38 mL/min — AB (ref >60)
Glucose, Bld: 141 mg/dL — ABNORMAL HIGH (ref 70–99)
Potassium: 3.8 mmol/L (ref 3.5–5.1)
SODIUM: 141 mmol/L (ref 135–145)
Total Bilirubin: 0.7 mg/dL (ref 0.3–1.2)
Total Protein: 5.2 g/dL — ABNORMAL LOW (ref 6.5–8.1)

## 2018-06-30 LAB — CBC WITH DIFFERENTIAL/PLATELET
ABS IMMATURE GRANULOCYTES: 0 10*3/uL (ref 0.0–0.1)
BASOS PCT: 1 %
Basophils Absolute: 0 10*3/uL (ref 0.0–0.1)
EOS ABS: 1 10*3/uL — AB (ref 0.0–0.7)
Eosinophils Relative: 14 %
HCT: 34.3 % — ABNORMAL LOW (ref 36.0–46.0)
Hemoglobin: 10.6 g/dL — ABNORMAL LOW (ref 12.0–15.0)
IMMATURE GRANULOCYTES: 0 %
Lymphocytes Relative: 23 %
Lymphs Abs: 1.7 10*3/uL (ref 0.7–4.0)
MCH: 28.1 pg (ref 26.0–34.0)
MCHC: 30.9 g/dL (ref 30.0–36.0)
MCV: 91 fL (ref 78.0–100.0)
Monocytes Absolute: 0.5 10*3/uL (ref 0.1–1.0)
Monocytes Relative: 7 %
NEUTROS ABS: 4.2 10*3/uL (ref 1.7–7.7)
NEUTROS PCT: 55 %
PLATELETS: 271 10*3/uL (ref 150–400)
RBC: 3.77 MIL/uL — ABNORMAL LOW (ref 3.87–5.11)
RDW: 13.7 % (ref 11.5–15.5)
WBC: 7.6 10*3/uL (ref 4.0–10.5)

## 2018-06-30 LAB — GLUCOSE, CAPILLARY
GLUCOSE-CAPILLARY: 174 mg/dL — AB (ref 70–99)
Glucose-Capillary: 113 mg/dL — ABNORMAL HIGH (ref 70–99)

## 2018-06-30 LAB — URINALYSIS, ROUTINE W REFLEX MICROSCOPIC
BILIRUBIN URINE: NEGATIVE
Glucose, UA: NEGATIVE mg/dL
Hgb urine dipstick: NEGATIVE
Ketones, ur: NEGATIVE mg/dL
Nitrite: POSITIVE — AB
PROTEIN: NEGATIVE mg/dL
Specific Gravity, Urine: 1.005 (ref 1.005–1.030)
pH: 5 (ref 5.0–8.0)

## 2018-06-30 LAB — I-STAT TROPONIN, ED: TROPONIN I, POC: 0.01 ng/mL (ref 0.00–0.08)

## 2018-06-30 LAB — HEMOGLOBIN A1C
HEMOGLOBIN A1C: 7 % — AB (ref 4.8–5.6)
Mean Plasma Glucose: 154.2 mg/dL

## 2018-06-30 LAB — I-STAT CHEM 8, ED
BUN: 19 mg/dL (ref 8–23)
Calcium, Ion: 1.16 mmol/L (ref 1.15–1.40)
Chloride: 104 mmol/L (ref 98–111)
Creatinine, Ser: 1.2 mg/dL — ABNORMAL HIGH (ref 0.44–1.00)
Glucose, Bld: 136 mg/dL — ABNORMAL HIGH (ref 70–99)
HEMATOCRIT: 32 % — AB (ref 36.0–46.0)
Hemoglobin: 10.9 g/dL — ABNORMAL LOW (ref 12.0–15.0)
POTASSIUM: 3.8 mmol/L (ref 3.5–5.1)
Sodium: 141 mmol/L (ref 135–145)
TCO2: 26 mmol/L (ref 22–32)

## 2018-06-30 LAB — I-STAT CG4 LACTIC ACID, ED
LACTIC ACID, VENOUS: 1.98 mmol/L — AB (ref 0.5–1.9)
Lactic Acid, Venous: 2.93 mmol/L (ref 0.5–1.9)

## 2018-06-30 MED ORDER — FENOFIBRATE 160 MG PO TABS
160.0000 mg | ORAL_TABLET | Freq: Every evening | ORAL | Status: DC
  Administered 2018-06-30 – 2018-07-01 (×2): 160 mg via ORAL
  Filled 2018-06-30 (×2): qty 1

## 2018-06-30 MED ORDER — VANCOMYCIN HCL 10 G IV SOLR
2000.0000 mg | Freq: Once | INTRAVENOUS | Status: AC
  Administered 2018-06-30: 2000 mg via INTRAVENOUS
  Filled 2018-06-30: qty 2000

## 2018-06-30 MED ORDER — INSULIN ASPART 100 UNIT/ML ~~LOC~~ SOLN
3.0000 [IU] | Freq: Three times a day (TID) | SUBCUTANEOUS | Status: DC
  Administered 2018-07-01 – 2018-07-02 (×5): 3 [IU] via SUBCUTANEOUS

## 2018-06-30 MED ORDER — ACETAMINOPHEN 325 MG PO TABS
650.0000 mg | ORAL_TABLET | Freq: Four times a day (QID) | ORAL | Status: DC | PRN
  Administered 2018-07-01: 650 mg via ORAL
  Filled 2018-06-30: qty 2

## 2018-06-30 MED ORDER — SODIUM CHLORIDE 0.9 % IV BOLUS (SEPSIS)
1000.0000 mL | Freq: Once | INTRAVENOUS | Status: AC
  Administered 2018-06-30: 1000 mL via INTRAVENOUS

## 2018-06-30 MED ORDER — SODIUM CHLORIDE 0.9 % IV SOLN
1.0000 g | INTRAVENOUS | Status: DC
  Filled 2018-06-30: qty 10

## 2018-06-30 MED ORDER — VANCOMYCIN HCL IN DEXTROSE 1-5 GM/200ML-% IV SOLN: 1000.0000 mg | Freq: Once | INTRAVENOUS | Status: DC

## 2018-06-30 MED ORDER — METRONIDAZOLE IN NACL 5-0.79 MG/ML-% IV SOLN
500.0000 mg | Freq: Three times a day (TID) | INTRAVENOUS | Status: DC
  Administered 2018-06-30: 500 mg via INTRAVENOUS
  Filled 2018-06-30: qty 100

## 2018-06-30 MED ORDER — GI COCKTAIL ~~LOC~~: 30.0000 mL | Freq: Once | ORAL | Status: DC

## 2018-06-30 MED ORDER — POLYETHYLENE GLYCOL 3350 17 G PO PACK: 17.0000 g | PACK | Freq: Every day | ORAL | Status: DC | PRN

## 2018-06-30 MED ORDER — IPRATROPIUM-ALBUTEROL 0.5-2.5 (3) MG/3ML IN SOLN
3.0000 mL | Freq: Two times a day (BID) | RESPIRATORY_TRACT | Status: DC
  Administered 2018-06-30 – 2018-07-02 (×4): 3 mL via RESPIRATORY_TRACT
  Filled 2018-06-30 (×4): qty 3

## 2018-06-30 MED ORDER — ALBUTEROL SULFATE (2.5 MG/3ML) 0.083% IN NEBU
2.5000 mg | INHALATION_SOLUTION | RESPIRATORY_TRACT | Status: DC | PRN
  Filled 2018-06-30: qty 3

## 2018-06-30 MED ORDER — ONDANSETRON HCL 4 MG/2ML IJ SOLN: 4.0000 mg | Freq: Four times a day (QID) | INTRAMUSCULAR | Status: DC | PRN

## 2018-06-30 MED ORDER — AMLODIPINE BESYLATE 5 MG PO TABS
5.0000 mg | ORAL_TABLET | Freq: Every day | ORAL | Status: DC
  Administered 2018-06-30 – 2018-07-02 (×3): 5 mg via ORAL
  Filled 2018-06-30 (×3): qty 1

## 2018-06-30 MED ORDER — SODIUM CHLORIDE 0.9 % IV SOLN
1.0000 g | INTRAVENOUS | Status: DC
  Administered 2018-06-30 – 2018-07-01 (×2): 1 g via INTRAVENOUS
  Filled 2018-06-30 (×3): qty 10

## 2018-06-30 MED ORDER — INSULIN DETEMIR 100 UNIT/ML ~~LOC~~ SOLN
5.0000 [IU] | Freq: Two times a day (BID) | SUBCUTANEOUS | Status: DC
  Administered 2018-06-30 – 2018-07-02 (×4): 5 [IU] via SUBCUTANEOUS
  Filled 2018-06-30 (×6): qty 0.05

## 2018-06-30 MED ORDER — ASPIRIN 81 MG PO CHEW
81.0000 mg | CHEWABLE_TABLET | Freq: Every day | ORAL | Status: DC
  Administered 2018-07-01 – 2018-07-02 (×2): 81 mg via ORAL
  Filled 2018-06-30 (×2): qty 1

## 2018-06-30 MED ORDER — ONDANSETRON HCL 4 MG PO TABS: 4.0000 mg | ORAL_TABLET | Freq: Four times a day (QID) | ORAL | Status: DC | PRN

## 2018-06-30 MED ORDER — INSULIN ASPART 100 UNIT/ML ~~LOC~~ SOLN
0.0000 [IU] | Freq: Three times a day (TID) | SUBCUTANEOUS | Status: DC
  Administered 2018-07-01: 2 [IU] via SUBCUTANEOUS
  Administered 2018-07-01: 3 [IU] via SUBCUTANEOUS
  Administered 2018-07-01: 2 [IU] via SUBCUTANEOUS
  Administered 2018-07-02: 3 [IU] via SUBCUTANEOUS
  Administered 2018-07-02: 2 [IU] via SUBCUTANEOUS

## 2018-06-30 MED ORDER — SODIUM CHLORIDE 0.9 % IV SOLN: 1.0000 g | Freq: Two times a day (BID) | INTRAVENOUS | Status: DC

## 2018-06-30 MED ORDER — SODIUM CHLORIDE 0.9 % IV BOLUS (SEPSIS)
500.0000 mL | Freq: Once | INTRAVENOUS | Status: AC
  Administered 2018-06-30: 500 mL via INTRAVENOUS

## 2018-06-30 MED ORDER — APIXABAN 5 MG PO TABS
5.0000 mg | ORAL_TABLET | Freq: Two times a day (BID) | ORAL | Status: DC
  Administered 2018-06-30 – 2018-07-02 (×4): 5 mg via ORAL
  Filled 2018-06-30 (×4): qty 1

## 2018-06-30 MED ORDER — METOPROLOL SUCCINATE ER 100 MG PO TB24
100.0000 mg | ORAL_TABLET | Freq: Every day | ORAL | Status: DC
  Administered 2018-06-30 – 2018-07-02 (×3): 100 mg via ORAL
  Filled 2018-06-30 (×3): qty 1

## 2018-06-30 MED ORDER — SODIUM CHLORIDE 0.9 % IV SOLN
2.0000 g | Freq: Once | INTRAVENOUS | Status: AC
  Administered 2018-06-30: 2 g via INTRAVENOUS
  Filled 2018-06-30: qty 2

## 2018-06-30 MED ORDER — INSULIN ASPART 100 UNIT/ML ~~LOC~~ SOLN
0.0000 [IU] | Freq: Every day | SUBCUTANEOUS | Status: DC
  Administered 2018-07-01: 2 [IU] via SUBCUTANEOUS

## 2018-06-30 MED ORDER — ATORVASTATIN CALCIUM 40 MG PO TABS
40.0000 mg | ORAL_TABLET | Freq: Every day | ORAL | Status: DC
  Administered 2018-06-30 – 2018-07-02 (×3): 40 mg via ORAL
  Filled 2018-06-30 (×3): qty 1

## 2018-06-30 MED ORDER — VANCOMYCIN HCL 10 G IV SOLR: 1250.0000 mg | INTRAVENOUS | Status: DC

## 2018-06-30 MED ORDER — MOMETASONE FURO-FORMOTEROL FUM 200-5 MCG/ACT IN AERO
2.0000 | INHALATION_SPRAY | Freq: Two times a day (BID) | RESPIRATORY_TRACT | Status: DC
  Administered 2018-07-01 – 2018-07-02 (×3): 2 via RESPIRATORY_TRACT
  Filled 2018-06-30 (×2): qty 8.8

## 2018-06-30 MED ORDER — ACETAMINOPHEN 650 MG RE SUPP: 650.0000 mg | Freq: Four times a day (QID) | RECTAL | Status: DC | PRN

## 2018-06-30 MED ORDER — SODIUM CHLORIDE 0.9 % IV SOLN
INTRAVENOUS | Status: DC
  Administered 2018-06-30: 1000 mL via INTRAVENOUS
  Administered 2018-07-01: 06:00:00 via INTRAVENOUS

## 2018-06-30 NOTE — ED Provider Notes (Signed)
Medical screening examination/treatment/procedure(s) were conducted as a shared visit with non-physician practitioner(s) and myself.  I personally evaluated the patient during the encounter.  EKG Interpretation  Date/Time:  Thursday June 30 2018 12:19:53 EDT Ventricular Rate:  64 PR Interval:    QRS Duration: 134 QT Interval:  479 QTC Calculation: 495 R Axis:   -85 Text Interpretation:  Atrial-ventricular dual-paced rhythm No further analysis attempted due to paced rhythm Confirmed by Fredia Sorrow [phone]) on 06/30/2018 12:30:20 PM   Patient seen by me along with the physician assistant.  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  She has not felt well for a few days.  Patient reports some mild neck pain some right lower quadrant pain lightheadedness and fatigue.  Not looking well at work EMS was called they felt that her blood pressure was low EMS confirmed that the systolic blood pressure of 50.  She was diaphoretic.  This increased to above 100 after 1 L fluid by EMS patient arrived here with blood pressures in the low 176 systolic range.  Shortly after that she did drop down into the 80 to mid 80 range patient received an additional 300 cc fluid and came back up to 100 so not sure if the 80 drop was accurate or not.  Patient had cardiac cath and a stent in May.  Patient has a pacemaker due to atrial fibrillation.  Pacemaker is been placed since 2011.  Patient is on Eliquis.  Patient with the fluids blood pressure in general stayed above 160 systolic.  Patient was initially treated as if this was sepsis.  Patient did receive the full 30/cc kilogram bolus.  Normal  Negative troponin.  Patient initial lactic acid was elevated.  With fluids it came down below 2 but was still at 1.98.  Patient's urinalysis suggestive urinary tract infection may be a potential source.  Chest x-ray negative for any acute findings.  CRITICAL CARE Performed by: Fredia Sorrow Total  critical care time: 30 minutes Critical care time was exclusive of separately billable procedures and treating other patients. Critical care was necessary to treat or prevent imminent or life-threatening deterioration. Critical care was time spent personally by me on the following activities: development of treatment plan with patient and/or surrogate as well as nursing, discussions with consultants, evaluation of patient's response to treatment, examination of patient, obtaining history from patient or surrogate, ordering and performing treatments and interventions, ordering and review of laboratory studies, ordering and review of radiographic studies, pulse oximetry and re-evaluation of patient's condition.   Results for orders placed or performed during the hospital encounter of 06/30/18  Comprehensive metabolic panel  Result Value Ref Range   Sodium 141 135 - 145 mmol/L   Potassium 3.8 3.5 - 5.1 mmol/L   Chloride 108 98 - 111 mmol/L   CO2 25 22 - 32 mmol/L   Glucose, Bld 141 (H) 70 - 99 mg/dL   BUN 17 8 - 23 mg/dL   Creatinine, Ser 1.32 (H) 0.44 - 1.00 mg/dL   Calcium 8.6 (L) 8.9 - 10.3 mg/dL   Total Protein 5.2 (L) 6.5 - 8.1 g/dL   Albumin 2.9 (L) 3.5 - 5.0 g/dL   AST 21 15 - 41 U/L   ALT 14 0 - 44 U/L   Alkaline Phosphatase 20 (L) 38 - 126 U/L   Total Bilirubin 0.7 0.3 - 1.2 mg/dL   GFR calc non Af Amer 38 (L) >60 mL/min   GFR calc Af  Amer 45 (L) >60 mL/min   Anion gap 8 5 - 15  CBC WITH DIFFERENTIAL  Result Value Ref Range   WBC 7.6 4.0 - 10.5 K/uL   RBC 3.77 (L) 3.87 - 5.11 MIL/uL   Hemoglobin 10.6 (L) 12.0 - 15.0 g/dL   HCT 34.3 (L) 36.0 - 46.0 %   MCV 91.0 78.0 - 100.0 fL   MCH 28.1 26.0 - 34.0 pg   MCHC 30.9 30.0 - 36.0 g/dL   RDW 13.7 11.5 - 15.5 %   Platelets 271 150 - 400 K/uL   Neutrophils Relative % 55 %   Neutro Abs 4.2 1.7 - 7.7 K/uL   Lymphocytes Relative 23 %   Lymphs Abs 1.7 0.7 - 4.0 K/uL   Monocytes Relative 7 %   Monocytes Absolute 0.5 0.1 - 1.0 K/uL    Eosinophils Relative 14 %   Eosinophils Absolute 1.0 (H) 0.0 - 0.7 K/uL   Basophils Relative 1 %   Basophils Absolute 0.0 0.0 - 0.1 K/uL   Immature Granulocytes 0 %   Abs Immature Granulocytes 0.0 0.0 - 0.1 K/uL  Urinalysis, Routine w reflex microscopic  Result Value Ref Range   Color, Urine AMBER (A) YELLOW   APPearance CLEAR CLEAR   Specific Gravity, Urine 1.005 1.005 - 1.030   pH 5.0 5.0 - 8.0   Glucose, UA NEGATIVE NEGATIVE mg/dL   Hgb urine dipstick NEGATIVE NEGATIVE   Bilirubin Urine NEGATIVE NEGATIVE   Ketones, ur NEGATIVE NEGATIVE mg/dL   Protein, ur NEGATIVE NEGATIVE mg/dL   Nitrite POSITIVE (A) NEGATIVE   Leukocytes, UA SMALL (A) NEGATIVE   RBC / HPF 0-5 0 - 5 RBC/hpf   WBC, UA 21-50 0 - 5 WBC/hpf   Bacteria, UA MANY (A) NONE SEEN   Squamous Epithelial / LPF 0-5 0 - 5   Mucus PRESENT    Hyaline Casts, UA PRESENT   Glucose, capillary  Result Value Ref Range   Glucose-Capillary 113 (H) 70 - 99 mg/dL  I-Stat CG4 Lactic Acid, ED  (not at  [facility])  Result Value Ref Range   Lactic Acid, Venous 2.93 (HH) 0.5 - 1.9 mmol/L   Comment NOTIFIED PHYSICIAN   I-stat Chem 8, ED  Result Value Ref Range   Sodium 141 135 - 145 mmol/L   Potassium 3.8 3.5 - 5.1 mmol/L   Chloride 104 98 - 111 mmol/L   BUN 19 8 - 23 mg/dL   Creatinine, Ser 1.20 (H) 0.44 - 1.00 mg/dL   Glucose, Bld 136 (H) 70 - 99 mg/dL   Calcium, Ion 1.16 1.15 - 1.40 mmol/L   TCO2 26 22 - 32 mmol/L   Hemoglobin 10.9 (L) 12.0 - 15.0 g/dL   HCT 32.0 (L) 36.0 - 46.0 %  I-stat troponin, ED (not at [facility])  Result Value Ref Range   Troponin i, poc 0.01 0.00 - 0.08 ng/mL   Comment 3          I-Stat CG4 Lactic Acid, ED  (not at  [facility])  Result Value Ref Range   Lactic Acid, Venous 1.98 (H) 0.5 - 1.9 mmol/L   Patient stable for admission.   Fredia Sorrow, MD 06/30/18 [phone]

## 2018-06-30 NOTE — ED Triage Notes (Signed)
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 PT has not felt well for a few days. PT reports neck pain, RLQ pain, lightheadedness, fatigue. PT was diaphoretic and hypotensive on EMS arrival. Sysolic BP was 50 palp. This increased to 130 after 1 L of fluid. PT had 324 asa at noon. PT had a cath and a stent in May. PT has a pacemaker due to afib. This has been in place since 2011.   PT had ST elevation in leads 2, 3, and AVF, it was trasmitted to [facility] and code stemi was cancelled after review of previous EKGs

## 2018-06-30 NOTE — ED Provider Notes (Signed)
[facility] EMERGENCY DEPARTMENT Provider Note   CSN: [id number] Arrival date & time: 06/30/18  1212     History   Chief Complaint Chief Complaint  Patient presents with  . Chest Pain  . Hypotension    HPI Brandi Hunter is a 77 y.o. female brought in by EMS for hypotensive episode.  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  She says that she has been feeling poorly since last Friday with generalized malaise.  She states that she was treated for a urinary tract infection with Cipro about 3 weeks ago and had return of symptoms starting last Friday which included dysuria frequency urgency.  [facility] and states that although she was feeling poorly she was able to get through it but felt extremely tired when she got home.  This morning before volunteering she says that she felt like maybe she just got out of bed and went that she would feel better however she had worsening malaise and headache with associated weakness.  She was trying to do some filing when she noticed that she broke out in a cold sweat, had posterior neck pain and could not focus her eyes on what she was doing and had to lay down on the floor.  The nursing staff checked her sugars which were in the 200s and gave her some juice and crackers.  When EMS arrived she was noted to have a systolic pressure of 85/I.  He had a liter of fluid prior to arrival and pressures improved to 120 however upon my evaluation her systolic pressure was back in the 80s and I initiated sepsis protocol.  Patient denies chest pain, shortness of breath, fevers, chills, nausea, vomiting, abdominal pain, flank pain.  She denies chest pain, racing or skipping in her heart.  HPI  Past Medical History:  Diagnosis Date  . Asthma   . Chronic anticoagulation   . COPD (chronic obstructive pulmonary disease) ([place])   . Diabetes mellitus without complication ([place])    TYPE 2  . DJD (degenerative joint  disease)   . GERD (gastroesophageal reflux disease)   . History of home oxygen therapy    oxygen 2 liters per nasal cannula at hs  . HTN (hypertension)   . Hyperlipidemia   . Mobitz (type) II atrioventricular block   . Obesity   . Pacemaker    Implanted  by Dr [name] (MDT) 10/06/10  . PAF (paroxysmal atrial fibrillation) ([place])   . Pancreatitis 2010 OR 2011   pt thought this had been ruled out  . Sleep apnea    CPA SETTING OF 14    Patient Active Problem List   Diagnosis Date Noted  . Sepsis due to urinary tract infection ([place]) 06/30/2018  . AKI (acute kidney injury) ([place]) 06/30/2018  . Sepsis, unspecified organism ([place]) 06/30/2018  . Normocytic anemia 06/30/2018  . Coronary artery disease due to lipid rich plaque   . Unstable angina ([place]) 03/23/2018  . Acute respiratory distress 09/26/2017  . Acute exacerbation of chronic obstructive pulmonary disease (COPD) ([place]) 09/26/2017  . 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 Type 2 diabetes mellitus ([place]) 09/26/2017  . Chronic diastolic heart failure ([place]) 09/26/2017  . Cardiac pacemaker 11/17/2016  . Eczema 11/17/2016  . Major depression, recurrent, chronic ([place]) 09/17/2016  . Axillary abscess 07/22/2016  . Chronic left shoulder pain 10/01/2015  . Xerosis cutis 10/01/2015  . Paroxysmal atrial fibrillation ([place]) 05/24/2014  .  PAF (paroxysmal atrial fibrillation) ([place]) 05/24/2014  . NAFLD (nonalcoholic fatty liver disease) 03/29/2014  . Renal cyst 03/29/2014  . Elevated TSH 03/06/2014  . Type 2 diabetes mellitus without complication, without long-term current use of insulin ([place]) 03/06/2014  . Peripheral neuropathy 04/27/2013  . Vitamin B12 deficiency 04/27/2013  . Abnormality of gait 03/29/2013  . Diabetic neuropathy ([place]) 03/29/2013  . OSA (obstructive sleep apnea) 08/26/2012  . Chronic asthmatic bronchitis ([place]) 08/26/2012  . Preop cardiovascular exam 08/15/2012  . Nocturnal hypoxia 04/08/2012  . Seborrheic  dermatitis 10/21/2011  . IBS (irritable bowel syndrome) 05/27/2011  . Esophageal reflux 03/27/2011  . Mixed hyperlipidemia 03/27/2011  . Chronic obstructive pulmonary disease ([place]) 03/27/2011  . 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 Essential hypertension 02/09/2011  . Obesity 02/09/2011  . Tobacco use disorder 02/05/2011  . Vitamin D deficiency 12/30/2010  . Osteoarthrosis involving lower leg 12/26/2010  . Allergic rhinitis 01/23/2009  . Mixed incontinence 10/08/2008    Past Surgical History:  Procedure Laterality Date  . CATARACT EXTRACTION W/PHACO  11/02/2011   Procedure: CATARACT EXTRACTION PHACO AND INTRAOCULAR LENS PLACEMENT (IOC);  Surgeon: Williams Che;  Location: [facility];  Service: Ophthalmology;  Laterality: Right;  CDE=7.33  . CATARACT EXTRACTION W/PHACO  12/07/2011   Procedure: CATARACT EXTRACTION PHACO AND INTRAOCULAR LENS PLACEMENT (IOC);  Surgeon: Williams Che, MD;  Location: [facility];  Service: Ophthalmology;  Laterality: Left;  CDE 3.61  . COLONOSCOPY WITH PROPOFOL N/A 08/09/2014   Procedure: COLONOSCOPY WITH PROPOFOL;  Surgeon: Juanita Craver, MD;  Location: [facility];  Service: Endoscopy;  Laterality: N/A;  . CYSTOCELE REPAIR    . ESOPHAGOGASTRODUODENOSCOPY (EGD) WITH PROPOFOL N/A 08/09/2014   Procedure: ESOPHAGOGASTRODUODENOSCOPY (EGD) WITH PROPOFOL;  Surgeon: Juanita Craver, MD;  Location: [facility];  Service: Endoscopy;  Laterality: N/A;  . EYE SURGERY  11/01/2012   BOTH EYES CATARACTS  . INSERT / REPLACE / REMOVE PACEMAKER  10/06/10   MDT  implanted by Dr [name]  . KNEE ARTHROSCOPY     both  . LEFT HEART CATH AND CORONARY ANGIOGRAPHY N/A 03/24/2018   Procedure: LEFT HEART CATH AND CORONARY ANGIOGRAPHY;  Surgeon: Lorretta Harp, MD;  Location: [facility];  Service: Cardiovascular;  Laterality: N/A;  . OVARY SURGERY     removal  . REPAIR RECTOCELE    . SIMPLE MASTECTOMY WITH AXILLARY SENTINEL NODE  BIOPSY Left 01/09/2015   Procedure: Irrigation and Drainage Abcess left Axilla;  Surgeon: Jackolyn Confer, MD;  Location: [facility];  Service: General;  Laterality: Left;  . TONSILLECTOMY  AGE 58  . TOTAL ABDOMINAL HYSTERECTOMY  1971     OB History    Gravida  3   Para  3   Term  3   Preterm      AB      Living  3     SAB      TAB      Ectopic      Multiple      Live Births               Home Medications    Prior to Admission medications   Medication Sig Start Date End Date Taking? Authorizing Provider  albuterol (PROAIR HFA) 108 (90 BASE) MCG/ACT inhaler Inhale 2 puffs into the lungs every 4 (four) hours as needed for wheezing.  07/21/12  Yes [provider]  amLODipine (NORVASC) 5 MG tablet Take 1 tablet (5 mg total)  by mouth daily. 12/22/17 06/30/18 Yes [name], NP  apixaban (ELIQUIS) 5 MG TABS tablet Take 1 tablet (5 mg total) by mouth 2 (two) times daily. 03/25/18  Yes Isaiah Serge, NP  aspirin 81 MG chewable tablet Chew 81 mg by mouth daily.    Yes [provider]  atorvastatin (LIPITOR) 40 MG tablet Take 40 mg by mouth daily.  03/17/13  Yes [provider]  budesonide-formoterol (SYMBICORT) 160-4.5 MCG/ACT inhaler Inhale 2 puffs into the lungs 2 (two) times daily.   Yes [provider]  fenofibrate 160 MG tablet Take 160 mg by mouth every evening.   Yes [provider]  furosemide (LASIX) 40 MG tablet Take 1 tablet (40 mg total) by mouth daily. 12/22/17 12/22/18 Yes [name], NP  glipiZIDE (GLUCOTROL XL) 10 MG 24 hr tablet Take 20 mg by mouth daily with breakfast.  08/20/13  Yes [provider]  guaiFENesin (MUCINEX) 600 MG 12 hr tablet Take 600 mg by mouth 2 (two) times daily as needed for to loosen phlegm.    Yes [provider]  ipratropium-albuterol (DUONEB) 0.5-2.5 (3) MG/3ML SOLN Take 3 mLs by nebulization 2 (two) times daily.   Yes [provider]  isosorbide mononitrate (IMDUR)  30 MG 24 hr tablet Take 0.5 tablets (15 mg total) by mouth daily. 03/24/18  Yes Isaiah Serge, NP  LORazepam (ATIVAN) 0.5 MG tablet Take 0.25-0.5 mg by mouth 2 (two) times daily as needed for anxiety.  04/28/13  Yes [provider]  metFORMIN (GLUCOPHAGE) 1000 MG tablet Take 1 tablet (1,000 mg total) by mouth 2 (two) times daily with a meal. 03/27/18  Yes Isaiah Serge, NP  methocarbamol (ROBAXIN) 500 MG tablet Take 1 tablet by mouth 2 (two) times daily as needed for muscle spasms.    Yes [provider]  metoprolol succinate (TOPROL-XL) 100 MG 24 hr tablet TAKE 1 TABLET BY MOUTH ONCE DAILY Patient taking differently: Take 100 mg by mouth daily.  10/21/17  Yes [name], [name], MD  nitroGLYCERIN (NITROSTAT) 0.4 MG SL tablet Place 1 tablet (0.4 mg total) under the tongue every 5 (five) minutes as needed for chest pain. 05/03/17 06/30/18 Yes [name], NP  oxybutynin (DITROPAN-XL) 10 MG 24 hr tablet Take 10 mg by mouth daily.  07/19/13  Yes [provider]  Probiotic Product (ALIGN PO) Take 1 capsule by mouth daily.    Yes [provider]  spironolactone (ALDACTONE) 25 MG tablet Take 1 tablet (25 mg total) by mouth daily. 11/01/17  Yes [name], [name], MD  telmisartan (MICARDIS) 80 MG tablet Take 80 mg by mouth daily.  02/25/18  Yes [provider]  traMADol (ULTRAM) 50 MG tablet Take 1 tablet by mouth every 6 (six) hours as needed for pain. 10/24/16  Yes [provider]  triamcinolone ointment (KENALOG) 0.5 % Apply 1 application topically 2 (two) times daily.   Yes [provider]  enoxaparin (LOVENOX) 120 MG/0.8ML injection Inject 0.8 mLs (120 mg total) into the skin every 12 (twelve) hours. Patient not taking: Reported on 06/30/2018 05/27/18   Thompson Grayer, MD    Family History Family History  Problem Relation Age of Onset  . Congestive Heart Failure Mother   . Congestive Heart Failure Father   . Osteoarthritis Sister   . Prostate  cancer Brother   . Diabetes Brother   . Anesthesia problems Neg Hx   . Hypotension Neg Hx   . Malignant hyperthermia Neg  Hx   . Pseudochol deficiency Neg Hx     Social History Social History   Tobacco Use  . Smoking status: Former Smoker    Packs/day: 1.00    Years: 54.00    Pack years: 54.00    Types: Cigarettes    Last attempt to quit: 09/22/2017    Years since quitting: 0.7  . Smokeless tobacco: Former Systems developer    Quit date: 11/17/2011  Substance Use Topics  . Alcohol use: Yes    Alcohol/week: 1.0 standard drinks    Types: 1 Glasses of wine per week    Comment: couple glasses of wine occasionally-once a month  . Drug use: No     Allergies   Bee venom and Latex   Review of Systems Review of Systems  Ten systems reviewed and are negative for acute change, except as noted in the HPI.   Physical Exam Updated Vital Signs BP (!) 159/56 (BP Location: Right Arm) Comment: rn notified  Pulse 66   Temp (!) 97 F (36.1 C) (Oral)   Resp 18   Ht 5\' 7"  (1.702 m)   Wt 111.6 kg   SpO2 97%   BMI 38.53 kg/m   Physical Exam  Constitutional: She is oriented to person, place, and time. She appears well-developed and well-nourished. She appears ill. No distress.  HENT:  Head: Normocephalic and atraumatic.  Eyes: Conjunctivae are normal. No scleral icterus.  Neck: Normal range of motion.  Cardiovascular: Normal rate, regular rhythm and normal heart sounds. Exam reveals no gallop and no friction rub.  No murmur heard. Pulmonary/Chest: Effort normal and breath sounds normal. No respiratory distress.  Abdominal: Soft. Bowel sounds are normal. She exhibits no distension and no mass. There is no tenderness. There is no guarding.  No CVA tenderness  Neurological: She is alert and oriented to person, place, and time.  Skin: Skin is warm and dry. She is not diaphoretic.  Psychiatric: Her behavior is normal.  Nursing note and vitals reviewed.    ED Treatments / Results  Labs (all  labs ordered are listed, but only abnormal results are displayed) Labs Reviewed  COMPREHENSIVE METABOLIC PANEL - Abnormal; Notable for the following components:      Result Value   Glucose, Bld 141 (*)    Creatinine, Ser 1.32 (*)    Calcium 8.6 (*)    Total Protein 5.2 (*)    Albumin 2.9 (*)    Alkaline Phosphatase 20 (*)    GFR calc non Af Amer 38 (*)    GFR calc Af Amer 45 (*)    All other components within normal limits  CBC WITH DIFFERENTIAL/PLATELET - Abnormal; Notable for the following components:   RBC 3.77 (*)    Hemoglobin 10.6 (*)    HCT 34.3 (*)    Eosinophils Absolute 1.0 (*)    All other components within normal limits  URINALYSIS, ROUTINE W REFLEX MICROSCOPIC - Abnormal; Notable for the following components:   Color, Urine AMBER (*)    Nitrite POSITIVE (*)    Leukocytes, UA SMALL (*)    Bacteria, UA MANY (*)    All other components within normal limits  GLUCOSE, CAPILLARY - Abnormal; Notable for the following components:   Glucose-Capillary 113 (*)    All other components within normal limits  I-STAT CG4 LACTIC ACID, ED - Abnormal; Notable for the following components:   Lactic Acid, Venous 2.93 (*)    All other components within normal limits  I-STAT CHEM 8,  ED - Abnormal; Notable for the following components:   Creatinine, Ser 1.20 (*)    Glucose, Bld 136 (*)    Hemoglobin 10.9 (*)    HCT 32.0 (*)    All other components within normal limits  I-STAT CG4 LACTIC ACID, ED - Abnormal; Notable for the following components:   Lactic Acid, Venous 1.98 (*)    All other components within normal limits  CULTURE, BLOOD (ROUTINE X 2)  CULTURE, BLOOD (ROUTINE X 2)  URINE CULTURE  URINE CULTURE  PROTIME-INR  CORTISOL-AM, BLOOD  PROCALCITONIN  BASIC METABOLIC PANEL  CBC  HEMOGLOBIN A1C  I-STAT TROPONIN, ED    EKG EKG Interpretation  Date/Time:  Thursday June 30 2018 12:19:53 EDT Ventricular Rate:  64 PR Interval:    QRS Duration: 134 QT  Interval:  479 QTC Calculation: 495 R Axis:   -85 Text Interpretation:  Atrial-ventricular dual-paced rhythm No further analysis attempted due to paced rhythm Confirmed by Fredia Sorrow [phone]) on 06/30/2018 12:30:20 PM   Radiology Dg Chest Port 1 View  Result Date: 06/30/2018 CLINICAL DATA:  Lightheadedness. EXAM: PORTABLE CHEST 1 VIEW COMPARISON:  Mar 23, 2018 FINDINGS: Stable right pacemaker. Stable cardiomegaly. Stable elevation the right hemidiaphragm. The hila and mediastinum are unchanged. No pulmonary nodules, masses, or focal infiltrates. IMPRESSION: No active disease. Electronically Signed   By: Dorise Bullion III M.D   On: 06/30/2018 12:54    Procedures .Critical Care Performed by: Margarita Mail, PA-C Authorized by: Margarita Mail, PA-C   Critical care provider statement:    Critical care time (minutes):  60   Critical care was necessary to treat or prevent imminent or life-threatening deterioration of the following conditions:  Sepsis   Critical care was time spent personally by me on the following activities:  Discussions with consultants, evaluation of patient's response to treatment, examination of patient, ordering and performing treatments and interventions, ordering and review of laboratory studies, ordering and review of radiographic studies, pulse oximetry, re-evaluation of patient's condition, obtaining history from patient or surrogate and review of old charts   (including critical care time)  Medications Ordered in ED Medications  cefTRIAXone (ROCEPHIN) 1 g in sodium chloride 0.9 % 100 mL IVPB (has no administration in time range)  albuterol (PROVENTIL HFA;VENTOLIN HFA) 108 (90 Base) MCG/ACT inhaler 2 puff (has no administration in time range)  amLODipine (NORVASC) tablet 5 mg (has no administration in time range)  apixaban (ELIQUIS) tablet 5 mg (has no administration in time range)  aspirin chewable tablet 81 mg (has no administration in time range)   atorvastatin (LIPITOR) tablet 40 mg (has no administration in time range)  mometasone-formoterol (DULERA) 200-5 MCG/ACT inhaler 2 puff (has no administration in time range)  fenofibrate tablet 160 mg (has no administration in time range)  ipratropium-albuterol (DUONEB) 0.5-2.5 (3) MG/3ML nebulizer solution 3 mL (has no administration in time range)  metoprolol succinate (TOPROL-XL) 24 hr tablet 100 mg (has no administration in time range)  0.9 %  sodium chloride infusion (has no administration in time range)  cefTRIAXone (ROCEPHIN) 1 g in sodium chloride 0.9 % 100 mL IVPB (has no administration in time range)  acetaminophen (TYLENOL) tablet 650 mg (has no administration in time range)    Or  acetaminophen (TYLENOL) suppository 650 mg (has no administration in time range)  polyethylene glycol (MIRALAX / GLYCOLAX) packet 17 g (has no administration in time range)  ondansetron (ZOFRAN) tablet 4 mg (has no administration in time range)    Or  ondansetron (ZOFRAN) injection 4 mg (has no administration in time range)  insulin detemir (LEVEMIR) injection 5 Units (has no administration in time range)  insulin aspart (novoLOG) injection 0-15 Units (has no administration in time range)  insulin aspart (novoLOG) injection 0-5 Units (has no administration in time range)  insulin aspart (novoLOG) injection 3 Units (has no administration in time range)  ceFEPIme (MAXIPIME) 2 g in sodium chloride 0.9 % 100 mL IVPB (0 g Intravenous Stopped 06/30/18 1437)  sodium chloride 0.9 % bolus 1,000 mL (0 mLs Intravenous Stopped 06/30/18 1438)    And  sodium chloride 0.9 % bolus 1,000 mL (0 mLs Intravenous Stopped 06/30/18 1651)    And  sodium chloride 0.9 % bolus 500 mL (0 mLs Intravenous Stopped 06/30/18 1624)  vancomycin (VANCOCIN) 2,000 mg in sodium chloride 0.9 % 500 mL IVPB (2,000 mg Intravenous New Bag/Given 06/30/18 1530)     Initial Impression / Assessment and Plan / ED Course  I have reviewed the triage  vital signs and the nursing notes.  Pertinent labs & imaging results that were available during my care of the patient were reviewed by me and considered in my medical decision making (see chart for details).  Clinical Course as of Jun 30 1745  Thu Jun 30, 2018  1743 Nitrite(!): POSITIVE [AH]  1743 Bacteria, UA(!): MANY [AH]  1743 With initial lactic acid of 2.98, hypotension. White blood cell count is normal, negative troponin and creatinine at baseline.   [AH]  1744 I have considered the potential for GI bleed given the patient's use of Eliquis for excisional A. fib however her baseline hemoglobin is about 11 and she is 10.6 today without report of hematochezia or melena.  Given the patient's source of infection, dysuria I suspect her episodes of hypotension have more to do with systemic inflammatory reaction.  She has been given 30/kg fluid resuscitation and broad-spectrum antibiotics.  Blood cultures were taken.  Her blood pressures have stabilized.  Patient will be admitted to the stepdown unit by the hospitalist service.  She is been seen and shared visit with Dr. Rogene Houston.   [AH]  4270 Personally reviewed the patient's 1 view chest x-ray which shows no consolidation or other sources of infection no other acute abnormalities.  Her EKG is unremarkable except for paced rhythm   [AH]    Clinical Course User Index [AH] Margarita Mail, PA-C      Final Clinical Impressions(s) / ED Diagnoses   Final diagnoses:  Sepsis due to urinary tract infection [facility])    ED Discharge Orders    None       Margarita Mail, PA-C 06/30/18 1746    Fredia Sorrow, MD 07/06/18 [phone]

## 2018-06-30 NOTE — Progress Notes (Addendum)
Patient trasfered from ED to [phone] via stretcher; alert and oriented x 4; no complaints of pain; IV in LH running ABx; fluids started at 75cc/hr per MD order, and IV in LAC saline locked. Orient patient to room and unit; gave patient care guide; instructed how to use the call bell and  fall risk precautions. Will continue to monitor the patient.

## 2018-06-30 NOTE — H&P (Signed)
History and Physical    QUANDA PAVLICEK PPI:[id number] DOB: 1941-08-06 DOA: 06/30/2018   PCP: [name], FNP   Patient coming from:  Home    Chief Complaint: Dizziness fever and chills are started on the day of admission  HPI: Brandi Hunter is a 77 y.o. female with medical history significant for past medical history of past medical history of longtime history of tobacco abuse oxygen dependent, status post pacemaker placement for second-degree AV block Mobitz 2, recently treated about 3 weeks prior to admission with a course of ciprofloxacin for UTI she has completed treatment, started having suprapubic discomfort 6 days prior to admission she took over-the-counter medication but today when she was volunteering at [facility] she felt tired fatigue and with chills, she also relates that this morning she could not get out of bed, and she has had associated headaches about 3 days prior to admission.  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 she could not focus and broke out in a sweat and had to lay down on the floor the blood glucose called EMS and send her to the ED  The ED: In route EMS gave her a liter bolus of normal saline she was found to be hypotensive in the ED was fluid resuscitated started empirically on IV antibiotics, blood cultures and urine cultures were ordered       ED Course:  BP (!) 118/40   Pulse 60   Temp (!) 97 F (36.1 C) (Oral)   Resp 18   Wt 111.6 kg   SpO2 100%   BMI 39.71 kg/m     RBC 3.77 (*)     Hemoglobin 10.6 (*)    HCT 34.3 (*)    Eosinophils Absolute 1.0 (*)    All other components within normal limits  I-STAT CG4 LACTIC ACID, ED - Abnormal; Notable for the following components:   Lactic Acid, Venous 2.93 (*)    All other components within normal limits  I-STAT CHEM 8, ED - Abnormal; Notable for the following components:   Creatinine, Ser 1.20 (*)    Glucose, Bld 136 (*)    Hemoglobin 10.9 (*)    HCT 32.0 (*)    All  other components within normal limits  CULTURE, BLOOD (ROUTINE X 2)  CULTURE, BLOOD (ROUTINE X 2)  COMPREHENSIVE METABOLIC PANEL  URINALYSIS, ROUTINE W REFLEX MICROSCOPIC  I-STAT TROPONIN, ED    EKG EKG Interpretation  Date/Time:                  Thursday June 30 2018 12:19:53 EDT Ventricular Rate:         64 PR Interval:                   QRS Duration: 134 QT Interval:                 479 QTC Calculation:        495 R Axis:                         -85 Text Interpretation:       Atrial-ventricular dual-paced rhythm No further analysis attempted due to paced rhythm Confirmed by Fredia Sorrow [phone]) on 06/30/2018 12:30:20 PM   Radiology  ImagingResults(Last48hours)  Dg Chest Port 1 View  Result Date: 06/30/2018 CLINICAL DATA:  Lightheadedness. EXAM: PORTABLE CHEST 1 VIEW COMPARISON:  Mar 23, 2018 FINDINGS: Stable right pacemaker. Stable cardiomegaly. Stable elevation the  right hemidiaphragm. The hila and mediastinum are unchanged. No pulmonary nodules, masses, or focal infiltrates. IMPRESSION: No active disease. Electronically Signed   By: Dorise Bullion III M.D   On: 06/30/2018 12:54     Procedures Procedures (including critical care time)  Medications Ordered in ED Medications  ceFEPIme (MAXIPIME) 2 g in sodium chloride 0.9 % 100 mL IVPB (has no administration in time range)  metroNIDAZOLE (FLAGYL) IVPB 500 mg (has no administration in time range)  vancomycin (VANCOCIN) IVPB 1000 mg/200 mL premix (has no administration in time range)  sodium chloride 0.9 % bolus 1,000 mL (1,000 mLs Intravenous New Bag/Given 06/30/18 1329)    And  sodium chloride 0.9 % bolus 1,000 mL (has no administration in time range)    And  sodium chloride 0.9 % bolus 500 mL (has no administration in time range)     Review of Systems:  As per HPI otherwise all other systems reviewed and are negative  Past Medical History:  Diagnosis Date  . Asthma   . Chronic anticoagulation     . COPD (chronic obstructive pulmonary disease) ([place])   . Diabetes mellitus without complication ([place])    TYPE 2  . DJD (degenerative joint disease)   . GERD (gastroesophageal reflux disease)   . History of home oxygen therapy    oxygen 2 liters per nasal cannula at hs  . HTN (hypertension)   . Hyperlipidemia   . Mobitz (type) II atrioventricular block   . Obesity   . Pacemaker    Implanted  by Dr [name] (MDT) 10/06/10  . PAF (paroxysmal atrial fibrillation) ([place])   . Pancreatitis 2010 OR 2011   pt thought this had been ruled out  . Sleep apnea    CPA SETTING OF 14    Past Surgical History:  Procedure Laterality Date  . CATARACT EXTRACTION W/PHACO  11/02/2011   Procedure: CATARACT EXTRACTION PHACO AND INTRAOCULAR LENS PLACEMENT (IOC);  Surgeon: Williams Che;  Location: [facility];  Service: Ophthalmology;  Laterality: Right;  CDE=7.33  . CATARACT EXTRACTION W/PHACO  12/07/2011   Procedure: CATARACT EXTRACTION PHACO AND INTRAOCULAR LENS PLACEMENT (IOC);  Surgeon: Williams Che, MD;  Location: [facility];  Service: Ophthalmology;  Laterality: Left;  CDE 3.61  . COLONOSCOPY WITH PROPOFOL N/A 08/09/2014   Procedure: COLONOSCOPY WITH PROPOFOL;  Surgeon: Juanita Craver, MD;  Location: [facility];  Service: Endoscopy;  Laterality: N/A;  . CYSTOCELE REPAIR    . ESOPHAGOGASTRODUODENOSCOPY (EGD) WITH PROPOFOL N/A 08/09/2014   Procedure: ESOPHAGOGASTRODUODENOSCOPY (EGD) WITH PROPOFOL;  Surgeon: Juanita Craver, MD;  Location: [facility];  Service: Endoscopy;  Laterality: N/A;  . EYE SURGERY  11/01/2012   BOTH EYES CATARACTS  . INSERT / REPLACE / REMOVE PACEMAKER  10/06/10   MDT  implanted by Dr [name]  . KNEE ARTHROSCOPY     both  . LEFT HEART CATH AND CORONARY ANGIOGRAPHY N/A 03/24/2018   Procedure: LEFT HEART CATH AND CORONARY ANGIOGRAPHY;  Surgeon: Lorretta Harp, MD;  Location: [facility];  Service: Cardiovascular;  Laterality: N/A;  . OVARY SURGERY     removal  . REPAIR  RECTOCELE    . SIMPLE MASTECTOMY WITH AXILLARY SENTINEL NODE BIOPSY Left 01/09/2015   Procedure: Irrigation and Drainage Abcess left Axilla;  Surgeon: Jackolyn Confer, MD;  Location: [facility];  Service: General;  Laterality: Left;  . TONSILLECTOMY  AGE 57  . TOTAL ABDOMINAL HYSTERECTOMY  1971    Social History Social History   Socioeconomic History  .  Marital status: Widowed    Spouse name: Not on file  . Number of children: 3  . Years of education: College  . Highest education level: Not on file  Occupational History  . Occupation: Part time Retail buyer: [employer]  . Financial resource strain: Not on file  . Food insecurity:    Worry: Not on file    Inability: Not on file  . Transportation needs:    Medical: Not on file    Non-medical: Not on file  Tobacco Use  . Smoking status: Former Smoker    Packs/day: 1.00    Years: 54.00    Pack years: 54.00    Types: Cigarettes    Last attempt to quit: 09/22/2017    Years since quitting: 0.7  . Smokeless tobacco: Former Systems developer    Quit date: 11/17/2011  Substance and Sexual Activity  . Alcohol use: Yes    Alcohol/week: 1.0 standard drinks    Types: 1 Glasses of wine per week    Comment: couple glasses of wine occasionally-once a month  . Drug use: No  . Sexual activity: Not on file  Lifestyle  . Physical activity:    Days per week: Not on file    Minutes per session: Not on file  . Stress: Not on file  Relationships  . Social connections:    Talks on phone: Not on file    Gets together: Not on file    Attends religious service: Not on file    Active member of club or organization: Not on file    Attends meetings of clubs or organizations: Not on file    Relationship status: Not on file  . Intimate partner violence:    Fear of current or ex partner: Not on file    Emotionally abused: Not on file    Physically abused: Not on file    Forced sexual activity: Not on file  Other Topics  Concern  . Not on file  Social History Narrative   Patient lives at home alone.   Caffeine Use: 16oz drink daily     Allergies  Allergen Reactions  . Bee Venom Swelling  . Latex Swelling    LATEX CATHETERS    Family History  Problem Relation Age of Onset  . Congestive Heart Failure Mother   . Congestive Heart Failure Father   . Osteoarthritis Sister   . Prostate cancer Brother   . Diabetes Brother   . Anesthesia problems Neg Hx   . Hypotension Neg Hx   . Malignant hyperthermia Neg Hx   . Pseudochol deficiency Neg Hx        Prior to Admission medications   Medication Sig Start Date End Date Taking? Authorizing Provider  albuterol (PROAIR HFA) 108 (90 BASE) MCG/ACT inhaler Inhale 2 puffs into the lungs every 4 (four) hours as needed for wheezing.  07/21/12  Yes [provider]  amLODipine (NORVASC) 5 MG tablet Take 1 tablet (5 mg total) by mouth daily. 12/22/17 06/30/18 Yes [name], NP  apixaban (ELIQUIS) 5 MG TABS tablet Take 1 tablet (5 mg total) by mouth 2 (two) times daily. 03/25/18  Yes Isaiah Serge, NP  aspirin 81 MG chewable tablet Chew 81 mg by mouth daily.    Yes [provider]  atorvastatin (LIPITOR) 40 MG tablet Take 40 mg by mouth daily.  03/17/13  Yes [provider]  budesonide-formoterol (SYMBICORT) 160-4.5 MCG/ACT inhaler Inhale 2  puffs into the lungs 2 (two) times daily.   Yes [provider]  fenofibrate 160 MG tablet Take 160 mg by mouth every evening.   Yes [provider]  furosemide (LASIX) 40 MG tablet Take 1 tablet (40 mg total) by mouth daily. 12/22/17 12/22/18 Yes [name], NP  glipiZIDE (GLUCOTROL XL) 10 MG 24 hr tablet Take 20 mg by mouth daily with breakfast.  08/20/13  Yes [provider]  guaiFENesin (MUCINEX) 600 MG 12 hr tablet Take 600 mg by mouth 2 (two) times daily as needed for to loosen phlegm.    Yes [provider]  ipratropium-albuterol (DUONEB) 0.5-2.5 (3) MG/3ML  SOLN Take 3 mLs by nebulization 2 (two) times daily.   Yes [provider]  isosorbide mononitrate (IMDUR) 30 MG 24 hr tablet Take 0.5 tablets (15 mg total) by mouth daily. 03/24/18  Yes Isaiah Serge, NP  LORazepam (ATIVAN) 0.5 MG tablet Take 0.25-0.5 mg by mouth 2 (two) times daily as needed for anxiety.  04/28/13  Yes [provider]  metFORMIN (GLUCOPHAGE) 1000 MG tablet Take 1 tablet (1,000 mg total) by mouth 2 (two) times daily with a meal. 03/27/18  Yes Isaiah Serge, NP  methocarbamol (ROBAXIN) 500 MG tablet Take 1 tablet by mouth 2 (two) times daily as needed for muscle spasms.    Yes [provider]  metoprolol succinate (TOPROL-XL) 100 MG 24 hr tablet TAKE 1 TABLET BY MOUTH ONCE DAILY Patient taking differently: Take 100 mg by mouth daily.  10/21/17  Yes [name], [name], MD  nitroGLYCERIN (NITROSTAT) 0.4 MG SL tablet Place 1 tablet (0.4 mg total) under the tongue every 5 (five) minutes as needed for chest pain. 05/03/17 06/30/18 Yes [name], NP  oxybutynin (DITROPAN-XL) 10 MG 24 hr tablet Take 10 mg by mouth daily.  07/19/13  Yes [provider]  Probiotic Product (ALIGN PO) Take 1 capsule by mouth daily.    Yes [provider]  spironolactone (ALDACTONE) 25 MG tablet Take 1 tablet (25 mg total) by mouth daily. 11/01/17  Yes [name], [name], MD  telmisartan (MICARDIS) 80 MG tablet Take 80 mg by mouth daily.  02/25/18  Yes [provider]  traMADol (ULTRAM) 50 MG tablet Take 1 tablet by mouth every 6 (six) hours as needed for pain. 10/24/16  Yes [provider]  triamcinolone ointment (KENALOG) 0.5 % Apply 1 application topically 2 (two) times daily.   Yes [provider]  enoxaparin (LOVENOX) 120 MG/0.8ML injection Inject 0.8 mLs (120 mg total) into the skin every 12 (twelve) hours. Patient not taking: Reported on 06/30/2018 05/27/18   Thompson Grayer, MD     Physical Exam:  Vitals:   06/30/18 1415 06/30/18 1500  06/30/18 1515 06/30/18 1530  BP: 123/79 (!) 109/58 (!) 118/43 (!) 118/40  Pulse: (!) 59 62 60 60  Resp: 14 12 (!) 22 18  Temp:      TempSrc:      SpO2: 99% 99% 100% 100%  Weight:       Constitutional: In no acute distress Eyes: PERRL, lids and conjunctivae normal ENMT: Dry mucous membrane Neck: normal, supple, no masses, no thyromegaly Respiratory: Good air movement and clear to auscultation. Cardiovascular: Rate and rhythm with positive S1-S2 no murmurs rubs gallops. Abdomen: Soft, with suprapubic tenderness no rebound no guarding. Musculoskeletal: no clubbing / cyanosis. Moves all extremities Skin: no jaundice, No lesions.  Neurologic: 3-12 are grossly intact sensation is intact throughout muscle strength is  5-all 4 extremities deep tendon reflexes 2+ and sensations intact and symmetrically bilaterally. Psychiatric:   He is awake alert and oriented x3, slow to respond nonfocal on physical exam    Labs on Admission: I have personally reviewed following labs and imaging studies  CBC: Recent Labs  Lab 06/30/18 1229 06/30/18 1300  WBC 7.6  --   NEUTROABS 4.2  --   HGB 10.6* 10.9*  HCT 34.3* 32.0*  MCV 91.0  --   PLT 271  --     Basic Metabolic Panel: Recent Labs  Lab 06/30/18 1229 06/30/18 1300  NA 141 141  K 3.8 3.8  CL 108 104  CO2 25  --   GLUCOSE 141* 136*  BUN 17 19  CREATININE 1.32* 1.20*  CALCIUM 8.6*  --     GFR: Estimated Creatinine Clearance: 51.3 mL/min (A) (by C-G formula based on SCr of 1.2 mg/dL (H)).  Liver Function Tests: Recent Labs  Lab 06/30/18 1229  AST 21  ALT 14  ALKPHOS 20*  BILITOT 0.7  PROT 5.2*  ALBUMIN 2.9*   No results for input(s): LIPASE, AMYLASE in the last 168 hours. No results for input(s): AMMONIA in the last 168 hours.  Coagulation Profile: No results for input(s): INR, PROTIME in the last 168 hours.  Cardiac Enzymes: No results for input(s): CKTOTAL, CKMB, CKMBINDEX, TROPONINI in the last 168 hours.  BNP  (last 3 results) Recent Labs    10/20/17 1033  PROBNP 257    HbA1C: No results for input(s): HGBA1C in the last 72 hours.  CBG: No results for input(s): GLUCAP in the last 168 hours.  Lipid Profile: No results for input(s): CHOL, HDL, LDLCALC, TRIG, CHOLHDL, LDLDIRECT in the last 72 hours.  Thyroid Function Tests: No results for input(s): TSH, T4TOTAL, FREET4, T3FREE, THYROIDAB in the last 72 hours.  Anemia Panel: No results for input(s): VITAMINB12, FOLATE, FERRITIN, TIBC, IRON, RETICCTPCT in the last 72 hours.  Urine analysis:    Component Value Date/Time   COLORURINE AMBER (A) 06/30/2018 1435   APPEARANCEUR CLEAR 06/30/2018 1435   LABSPEC 1.005 06/30/2018 1435   PHURINE 5.0 06/30/2018 1435   GLUCOSEU NEGATIVE 06/30/2018 1435   HGBUR NEGATIVE 06/30/2018 1435   BILIRUBINUR NEGATIVE 06/30/2018 1435   KETONESUR NEGATIVE 06/30/2018 1435   PROTEINUR NEGATIVE 06/30/2018 1435   UROBILINOGEN 1.0 05/11/2011 2020   NITRITE POSITIVE (A) 06/30/2018 1435   LEUKOCYTESUR SMALL (A) 06/30/2018 1435    Sepsis Labs: @LABRCNTIP (procalcitonin:4,lacticidven:4) )No results found for this or any previous visit (from the past 240 hour(s)).   Radiological Exams on Admission: Dg Chest Port 1 View  Result Date: 06/30/2018 CLINICAL DATA:  Lightheadedness. EXAM: PORTABLE CHEST 1 VIEW COMPARISON:  Mar 23, 2018 FINDINGS: Stable right pacemaker. Stable cardiomegaly. Stable elevation the right hemidiaphragm. The hila and mediastinum are unchanged. No pulmonary nodules, masses, or focal infiltrates. IMPRESSION: No active disease. Electronically Signed   By: Dorise Bullion III M.D   On: 06/30/2018 12:54    EKG: Independently reviewed atrial-ventricular paced rhythm Assessment/Plan Sepsis due to urinary tract infection ([place]) Possibly partially treated UTI. Ordered blood cultures and urine cultures. We will start her empirically on IV Rocephin. She has been fluid resuscitated here in the ED, her  blood pressure has stabilized. We will admit to telemetry continue IV fluids and continue her on the sepsis pathway repeat a lactic acid. We will allow a diet.  Acute kidney injury: Likely prerenal in etiology, in the setting of diuretic use and sepsis.  Hold diuretics she has been fluid resuscitated in the ED, will continue normal saline.  Recheck a basic metabolic panel in the morning.  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 Continue current medications no changes made.  Essential hypertension Due to her borderline low blood pressure hold antihypertensive medication diuretics and indoor Continue metoprolol.  Obesity Counseling.  Chronic diastolic heart failure ([place]) She seems to be hypovolemic, will hold diuretics continue metoprolol. We will continue gentle IV fluid hydration for the next 12 hours. Blood pressure is improved can restart antihypertensive and heart failure medication.  Chronic obstructive pulmonary disease ([facility]) Deemed to be stable, continue current medication and home dose of O2.  PAF (paroxysmal atrial fibrillation) [facility]): Paced rhythm, rate controlled continue metoprolol and Eliquis.  Normocytic anemia Baseline hemoglobin around 11.5 on admission 10.5, which could be a normal fluctuation.  Repeat a CBC in the morning.  Further work-up if needed as an outpatient.  DVT prophylaxis: Eliquis Code Status:    Full Family Communication:  Discussed with patient Disposition Plan: Expect patient to be discharged to home after condition improves Consults called:    None Admission status:    Charlynne Cousins, PA-C [facility]   Amion text  [id number]   06/30/2018, 4:43 PM

## 2018-06-30 NOTE — Progress Notes (Signed)
Pharmacy Antibiotic Note  HYDIE LANGAN is a 77 y.o. female admitted on 06/30/2018 with infection from unknown source.  Pharmacy has been consulted for Vancomycin and Cefepime dosing. WBC 7.6. LA 2.93. CrCl ~ 40 mL/min.   Plan: -Cefepime 1 gm IV Q 12 hours -Vancomycin 2 gm IV once, then start vancomycin 1250 mg IV Q 24 hours -Monitor CBC, renal fx, cultures and clinical progress -VT at [facility]   Weight: 246 lb (111.6 kg)  Temp (24hrs), Avg:97 F (36.1 C), Min:97 F (36.1 C), Max:97 F (36.1 C)  Recent Labs  Lab 06/30/18 1229 06/30/18 1300 06/30/18 1301  WBC 7.6  --   --   CREATININE 1.32* 1.20*  --   LATICACIDVEN  --   --  2.93*    Estimated Creatinine Clearance: 51.3 mL/min (A) (by C-G formula based on SCr of 1.2 mg/dL (H)).    Allergies  Allergen Reactions  . Bee Venom Swelling  . Latex Swelling    LATEX CATHETERS    Antimicrobials this admission: Vanc 8/15>> Zosyn 8/15>>    Dose adjustments this admission: None  Microbiology results: 8/15 BCx:    Thank you for allowing pharmacy to be a part of this patient's care.  Albertina Parr, PharmD., BCPS Clinical Pharmacist Clinical phone for 06/30/18 until 3:30pm: [phone] If after 3:30pm, please refer to [facility] for unit-specific pharmacist

## 2018-07-01 ENCOUNTER — Other Ambulatory Visit: Payer: Self-pay

## 2018-07-01 ENCOUNTER — Encounter (HOSPITAL_COMMUNITY): Payer: Self-pay | Admitting: General Practice

## 2018-07-01 DIAGNOSIS — N390 Urinary tract infection, site not specified: Secondary | ICD-10-CM

## 2018-07-01 DIAGNOSIS — I5032 Chronic diastolic (congestive) heart failure: Secondary | ICD-10-CM | POA: Diagnosis not present

## 2018-07-01 DIAGNOSIS — N179 Acute kidney failure, unspecified: Secondary | ICD-10-CM

## 2018-07-01 DIAGNOSIS — A419 Sepsis, unspecified organism: Secondary | ICD-10-CM | POA: Diagnosis not present

## 2018-07-01 DIAGNOSIS — A4151 Sepsis due to Escherichia coli [E. coli]: Secondary | ICD-10-CM | POA: Diagnosis not present

## 2018-07-01 LAB — BASIC METABOLIC PANEL
Anion gap: 9 (ref 5–15)
BUN: 13 mg/dL (ref 8–23)
CALCIUM: 8.7 mg/dL — AB (ref 8.9–10.3)
CO2: 26 mmol/L (ref 22–32)
Chloride: 107 mmol/L (ref 98–111)
Creatinine, Ser: 0.89 mg/dL (ref 0.44–1.00)
GFR calc Af Amer: >60 mL/min (ref >60)
GLUCOSE: 137 mg/dL — AB (ref 70–99)
Potassium: 3.6 mmol/L (ref 3.5–5.1)
Sodium: 142 mmol/L (ref 135–145)

## 2018-07-01 LAB — CBC
HCT: 35.9 % — ABNORMAL LOW (ref 36.0–46.0)
Hemoglobin: 11.2 g/dL — ABNORMAL LOW (ref 12.0–15.0)
MCH: 28.3 pg (ref 26.0–34.0)
MCHC: 31.2 g/dL (ref 30.0–36.0)
MCV: 90.7 fL (ref 78.0–100.0)
Platelets: 281 10*3/uL (ref 150–400)
RBC: 3.96 MIL/uL (ref 3.87–5.11)
RDW: 13.8 % (ref 11.5–15.5)
WBC: 8.4 10*3/uL (ref 4.0–10.5)

## 2018-07-01 LAB — PROCALCITONIN

## 2018-07-01 LAB — GLUCOSE, CAPILLARY
GLUCOSE-CAPILLARY: 143 mg/dL — AB (ref 70–99)
GLUCOSE-CAPILLARY: 153 mg/dL — AB (ref 70–99)
Glucose-Capillary: 127 mg/dL — ABNORMAL HIGH (ref 70–99)
Glucose-Capillary: 213 mg/dL — ABNORMAL HIGH (ref 70–99)

## 2018-07-01 LAB — PROTIME-INR
INR: 1.28
PROTHROMBIN TIME: 15.9 s — AB (ref 11.4–15.2)

## 2018-07-01 LAB — CORTISOL-AM, BLOOD: CORTISOL - AM: 10.9 ug/dL (ref 6.7–22.6)

## 2018-07-01 MED ORDER — HYDRALAZINE HCL 20 MG/ML IJ SOLN: 5.0000 mg | INTRAMUSCULAR | Status: DC | PRN

## 2018-07-01 MED ORDER — ISOSORBIDE MONONITRATE ER 30 MG PO TB24
15.0000 mg | ORAL_TABLET | Freq: Every day | ORAL | Status: DC
  Administered 2018-07-01 – 2018-07-02 (×2): 15 mg via ORAL
  Filled 2018-07-01 (×2): qty 1

## 2018-07-01 NOTE — Progress Notes (Signed)
PROGRESS NOTE                                                                                                                                                                                                             Patient Demographics:    Brandi Hunter, is a 77 y.o. female, DOB - 09/03/1941, MHD:[id number]  Admit date - 06/30/2018   Admitting Physician Charlynne Cousins, MD  Outpatient Primary MD for the patient is [name], [place]  LOS - 1   Chief Complaint  Patient presents with  . Chest Pain  . Hypotension       Brief Narrative    77 y.o. female with history of tobacco abuse oxygen dependent, status post pacemaker placement for second-degree AV block Mobitz 2, treated with Cipro for UTI, presents with weakness, lightheadedness, fever and chills, work-up significant for UTI .   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  Essential hypertension -Due to soft blood pressure on admission, medication has been held, currently blood pressure is controlled, will add PRN hydralazine, and resume imdur, continue to hold Micardis and Aldactone  Chronic diastolic heart failure (HCC) -Hypovolemic yesterday, kept on IV fluids, she does appear to be euvolemic today, I well DC her IV fluids .  Chronic obstructive pulmonary disease ([place]) Deemed  to be stable, continue current medication and home dose of O2.  PAF (paroxysmal atrial fibrillation) [facility]): Paced rhythm, rate controlled continue metoprolol and Eliquis.  Normocytic anemia Baseline hemoglobin around 11.5 on admission 10.5, which could be a normal fluctuation.  Repeat a CBC in the morning.  Further work-up if needed as an outpatient.    Code Status : Full code  Family Communication  : None at bedside  Disposition Plan  : Home once stable  Consults  : None  Procedures  : none  DVT Prophylaxis  :  On Eliquis  Lab Results  Component Value Date   PLT 281 07/01/2018    Antibiotics  :    Anti-infectives (From admission, onward)   Start     Dose/Rate Route Frequency Ordered Stop   07/01/18 1500  vancomycin (VANCOCIN) 1,250 mg in sodium chloride 0.9 % 250 mL IVPB  Status:  Discontinued     1,250 mg 166.7 mL/hr over 90 Minutes Intravenous Every 24 hours 06/30/18 1353 06/30/18 1742   07/01/18 0200  ceFEPIme (MAXIPIME) 1 g in sodium chloride 0.9 % 100 mL IVPB  Status:  Discontinued     1 g 200 mL/hr over 30 Minutes Intravenous Every 12 hours 06/30/18 1353 06/30/18 1602   06/30/18 2000  cefTRIAXone (ROCEPHIN) 1 g in sodium chloride 0.9 % 100 mL IVPB     1 g 200 mL/hr over 30 Minutes Intravenous Every 24 hours 06/30/18 1742     06/30/18 1800  cefTRIAXone (ROCEPHIN) 1 g in sodium chloride 0.9 % 100 mL IVPB  Status:  Discontinued     1 g 200 mL/hr over 30 Minutes Intravenous Every 24 hours 06/30/18 1602 06/30/18 1751    06/30/18 1345  vancomycin (VANCOCIN) 2,000 mg in sodium chloride 0.9 % 500 mL IVPB     2,000 mg 250 mL/hr over 120 Minutes Intravenous  Once 06/30/18 1345 06/30/18 1757   06/30/18 1330  ceFEPIme (MAXIPIME) 2 g in sodium chloride 0.9 % 100 mL IVPB     2 g 200 mL/hr over 30 Minutes Intravenous  Once 06/30/18 1322 06/30/18 1437   06/30/18 1330  metroNIDAZOLE (FLAGYL) IVPB 500 mg  Status:  Discontinued     500 mg 100 mL/hr over 60 Minutes Intravenous Every 8 hours 06/30/18 1322 06/30/18 1602   06/30/18 1330  vancomycin (VANCOCIN) IVPB 1000 mg/200 mL premix  Status:  Discontinued     1,000 mg 200 mL/hr over 60 Minutes Intravenous  Once 06/30/18 1322 06/30/18 1345        Objective:   Vitals:   06/30/18 2200 07/01/18 0517 07/01/18 0849 07/01/18 0855  BP:  (!) 133/51 (!) 170/65   Pulse:  64 65 70  Resp:  17  18  Temp:  97.9 F (36.6 C)    TempSrc:      SpO2: 98% 95%  98%  Weight:      Height:        Wt Readings from Last 3 Encounters:  06/30/18 111.6 kg  06/29/18 111.6 kg  06/22/18 111.4 kg     Intake/Output Summary (Last 24 hours) at 07/01/2018 1127 Last data filed at 07/01/2018 0611 Gross per 24 hour  Intake 2938.28 ml  Output -  Net 2938.28 ml     Physical Exam  Awake Alert, Oriented X 3, No new F.N deficits, Normal affect Symmetrical Chest wall movement, Good air movement bilaterally, CTAB RRR,No Gallops,Rubs or new Murmurs, No Parasternal Heave +ve B.Sounds, Abd Soft, No tenderness, No rebound -  guarding or rigidity. No Cyanosis, Clubbing , mild pitting edema, No new Rash or bruise      Data Review:    CBC Recent Labs  Lab 06/30/18 1229 06/30/18 1300 07/01/18 0526  WBC 7.6  --  8.4  HGB 10.6* 10.9* 11.2*  HCT 34.3* 32.0* 35.9*  PLT 271  --  281  MCV 91.0  --  90.7  MCH 28.1  --  28.3  MCHC 30.9  --  31.2  RDW 13.7  --  13.8  LYMPHSABS 1.7  --   --   MONOABS 0.5  --   --   EOSABS 1.0*  --   --   BASOSABS 0.0  --   --     Chemistries  Recent  Labs  Lab 06/30/18 1229 06/30/18 1300 07/01/18 0526  NA 141 141 142  K 3.8 3.8 3.6  CL 108 104 107  CO2 25  --  26  GLUCOSE 141* 136* 137*  BUN 17 19 13   CREATININE 1.32* 1.20* 0.89  CALCIUM 8.6*  --  8.7*  AST 21  --   --   ALT 14  --   --   ALKPHOS 20*  --   --   BILITOT 0.7  --   --    ------------------------------------------------------------------------------------------------------------------ No results for input(s): CHOL, HDL, LDLCALC, TRIG, CHOLHDL, LDLDIRECT in the last 72 hours.  Lab Results  Component Value Date   HGBA1C 7.0 (H) 06/30/2018   ------------------------------------------------------------------------------------------------------------------ No results for input(s): TSH, T4TOTAL, T3FREE, THYROIDAB in the last 72 hours.  Invalid input(s): FREET3 ------------------------------------------------------------------------------------------------------------------ No results for input(s): VITAMINB12, FOLATE, FERRITIN, TIBC, IRON, RETICCTPCT in the last 72 hours.  Coagulation profile Recent Labs  Lab 07/01/18 0526  INR 1.28    No results for input(s): DDIMER in the last 72 hours.  Cardiac Enzymes No results for input(s): CKMB, TROPONINI, MYOGLOBIN in the last 168 hours.  Invalid input(s): CK ------------------------------------------------------------------------------------------------------------------    Component Value Date/Time   BNP 127.4 (H) 03/23/2018 1254    Inpatient Medications  Scheduled Meds: . amLODipine  5 mg Oral Daily  . apixaban  5 mg Oral BID  . aspirin  81 mg Oral Daily  . atorvastatin  40 mg Oral Daily  . fenofibrate  160 mg Oral QPM  . insulin aspart  0-15 Units Subcutaneous TID WC  . insulin aspart  0-5 Units Subcutaneous QHS  . insulin aspart  3 Units Subcutaneous TID WC  . insulin detemir  5 Units Subcutaneous BID  . ipratropium-albuterol  3 mL Nebulization BID  . metoprolol succinate  100 mg Oral Daily  .  mometasone-formoterol  2 puff Inhalation BID   Continuous Infusions: . sodium chloride 75 mL/hr at 07/01/18 0611  . cefTRIAXone (ROCEPHIN)  IV Stopped (06/30/18 2030)   PRN Meds:.acetaminophen **OR** acetaminophen, albuterol, hydrALAZINE, ondansetron **OR** ondansetron (ZOFRAN) IV, polyethylene glycol  Micro Results Recent Results (from the past 240 hour(s))  Blood Culture (routine x 2)     Status: None (Preliminary result)   Collection Time: 06/30/18  1:39 PM  Result Value Ref Range Status   Specimen Description BLOOD RIGHT HAND  Final   Special Requests   Final    BOTTLES DRAWN AEROBIC AND ANAEROBIC Blood Culture results may not be optimal due to an inadequate volume of blood received in culture bottles   Culture   Final    NO GROWTH < 24 HOURS Performed at [facility], [place] [address], [place], [place] [address]  Report Status PENDING  Incomplete  Blood Culture (routine x 2)     Status: None (Preliminary result)   Collection Time: 06/30/18  1:54 PM  Result Value Ref Range Status   Specimen Description BLOOD RIGHT HAND  Final   Special Requests   Final    BOTTLES DRAWN AEROBIC AND ANAEROBIC Blood Culture results may not be optimal due to an inadequate volume of blood received in culture bottles   Culture   Final    NO GROWTH < 24 HOURS Performed at [facility], [place] [address] [place], [place] [address]    Report Status PENDING  Incomplete  Urine Culture     Status: Abnormal (Preliminary result)   Collection Time: 06/30/18  3:30 PM  Result Value Ref Range Status   Specimen Description URINE, RANDOM  Final   Special Requests   Final    NONE Performed at [facility], [place] [address], [place], [place] [address]    Culture 60,000 COLONIES/mL GRAM NEGATIVE RODS (A)  Final   Report Status PENDING  Incomplete    Radiology Reports Dg Chest Port 1 View  Result Date: 06/30/2018 CLINICAL DATA:  Lightheadedness. EXAM: PORTABLE CHEST 1 VIEW COMPARISON:  Mar 23, 2018 FINDINGS: Stable right pacemaker. Stable cardiomegaly. Stable elevation the right hemidiaphragm. The hila and mediastinum are unchanged. No pulmonary nodules, masses, or focal infiltrates. IMPRESSION: No active disease. Electronically Signed   By: Dorise Bullion III M.D   On: 06/30/2018 12:54    Phillips Climes M.D on 07/01/2018 at 11:27 AM  Between 7am to 7pm - Pager - [phone]  After 7pm go to [email or web address] - password [email or web address]  [facility] -  Office  [phone]

## 2018-07-02 DIAGNOSIS — N179 Acute kidney failure, unspecified: Secondary | ICD-10-CM | POA: Diagnosis not present

## 2018-07-02 DIAGNOSIS — I5032 Chronic diastolic (congestive) heart failure: Secondary | ICD-10-CM | POA: Diagnosis not present

## 2018-07-02 DIAGNOSIS — A419 Sepsis, unspecified organism: Secondary | ICD-10-CM | POA: Diagnosis not present

## 2018-07-02 DIAGNOSIS — A4151 Sepsis due to Escherichia coli [E. coli]: Secondary | ICD-10-CM | POA: Diagnosis not present

## 2018-07-02 DIAGNOSIS — N390 Urinary tract infection, site not specified: Secondary | ICD-10-CM | POA: Diagnosis not present

## 2018-07-02 LAB — BASIC METABOLIC PANEL
Anion gap: 6 (ref 5–15)
BUN: 16 mg/dL (ref 8–23)
CHLORIDE: 108 mmol/L (ref 98–111)
CO2: 27 mmol/L (ref 22–32)
Calcium: 8.5 mg/dL — ABNORMAL LOW (ref 8.9–10.3)
Creatinine, Ser: 1 mg/dL (ref 0.44–1.00)
GFR calc Af Amer: >60 mL/min (ref >60)
GFR calc non Af Amer: 54 mL/min — ABNORMAL LOW (ref >60)
GLUCOSE: 170 mg/dL — AB (ref 70–99)
POTASSIUM: 3.8 mmol/L (ref 3.5–5.1)
Sodium: 141 mmol/L (ref 135–145)

## 2018-07-02 LAB — CBC
HEMATOCRIT: 34.5 % — AB (ref 36.0–46.0)
HEMOGLOBIN: 10.5 g/dL — AB (ref 12.0–15.0)
MCH: 27.6 pg (ref 26.0–34.0)
MCHC: 30.4 g/dL (ref 30.0–36.0)
MCV: 90.6 fL (ref 78.0–100.0)
Platelets: 267 10*3/uL (ref 150–400)
RBC: 3.81 MIL/uL — AB (ref 3.87–5.11)
RDW: 13.9 % (ref 11.5–15.5)
WBC: 6.9 10*3/uL (ref 4.0–10.5)

## 2018-07-02 LAB — GLUCOSE, CAPILLARY
GLUCOSE-CAPILLARY: 190 mg/dL — AB (ref 70–99)
Glucose-Capillary: 135 mg/dL — ABNORMAL HIGH (ref 70–99)
Glucose-Capillary: 206 mg/dL — ABNORMAL HIGH (ref 70–99)

## 2018-07-02 MED ORDER — CEFDINIR 300 MG PO CAPS: 300.0000 mg | ORAL_CAPSULE | Freq: Two times a day (BID) | ORAL | 0 refills | Status: DC

## 2018-07-02 MED ORDER — CEFDINIR 300 MG PO CAPS
300.0000 mg | ORAL_CAPSULE | Freq: Two times a day (BID) | ORAL | Status: DC
  Administered 2018-07-02: 300 mg via ORAL
  Filled 2018-07-02 (×2): qty 1

## 2018-07-02 MED ORDER — LACTINEX PO CHEW: 1.0000 | CHEWABLE_TABLET | Freq: Three times a day (TID) | ORAL | 0 refills | Status: DC

## 2018-07-02 NOTE — Progress Notes (Signed)
Pt want to have her cardiologist aware of her current hospitalization. Pt stated that she believes her cardiac issue provoke her current issue.

## 2018-07-02 NOTE — Discharge Summary (Signed)
CHEREESE Hunter, is a 77 y.o. female  DOB 07-21-1941  MRN [id number].  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 Asthma   . Chronic anticoagulation   . COPD (chronic obstructive pulmonary disease) ([place])   . Diabetes mellitus without complication ([place])    TYPE 2  . DJD (degenerative joint disease)   . GERD (gastroesophageal reflux disease)   . History of home oxygen therapy    oxygen 2 liters per nasal cannula at hs  . HTN (hypertension)   . Hyperlipidemia   . Mobitz (type) II atrioventricular block   . Obesity   . Pacemaker    Implanted  by Dr [name] (MDT) 10/06/10  . PAF (paroxysmal atrial fibrillation) ([place])   . Pancreatitis 2010 OR 2011   pt thought this had been ruled out  . Sleep apnea    CPA SETTING OF 14    Past Surgical History:  Procedure Laterality Date  . CATARACT EXTRACTION W/PHACO  11/02/2011   Procedure: CATARACT EXTRACTION PHACO AND INTRAOCULAR LENS PLACEMENT  (IOC);  Surgeon: Williams Che;  Location: [facility];  Service: Ophthalmology;  Laterality: Right;  CDE=7.33  . CATARACT EXTRACTION W/PHACO  12/07/2011   Procedure: CATARACT EXTRACTION PHACO AND INTRAOCULAR LENS PLACEMENT (IOC);  Surgeon: Williams Che, MD;  Location: [facility];  Service: Ophthalmology;  Laterality: Left;  CDE 3.61  . COLONOSCOPY WITH PROPOFOL N/A 08/09/2014   Procedure: COLONOSCOPY WITH PROPOFOL;  Surgeon: Juanita Craver, MD;  Location: [facility];  Service: Endoscopy;  Laterality: N/A;  . CYSTOCELE REPAIR    . ESOPHAGOGASTRODUODENOSCOPY (EGD) WITH PROPOFOL N/A 08/09/2014   Procedure: ESOPHAGOGASTRODUODENOSCOPY (EGD) WITH PROPOFOL;  Surgeon: Juanita Craver, MD;  [facility];  Service: Endoscopy;  Laterality: N/A;  . EYE SURGERY  11/01/2012   BOTH EYES CATARACTS  . INSERT / REPLACE / REMOVE PACEMAKER  10/06/10   MDT  implanted by Dr [name]  . KNEE ARTHROSCOPY     both  . LEFT HEART CATH AND CORONARY ANGIOGRAPHY N/A 03/24/2018   Procedure: LEFT HEART CATH AND CORONARY ANGIOGRAPHY;  Surgeon: Gwenlyn Found,  Pearletha Forge, MD;  Location: [facility];  Service: Cardiovascular;  Laterality: N/A;  . OVARY SURGERY     removal  . REPAIR RECTOCELE    . SIMPLE MASTECTOMY WITH AXILLARY SENTINEL NODE BIOPSY Left 01/09/2015   Procedure: Irrigation and Drainage Abcess left Axilla;  Surgeon: Jackolyn Confer, MD;  Location: [facility];  Service: General;  Laterality: Left;  . TONSILLECTOMY  AGE 35  . TOTAL ABDOMINAL HYSTERECTOMY  1971       History of present illness and  Hospital Course:     Kindly see H&P for history of present illness and admission details, please review complete Labs, Consult reports and Test reports for all details in brief  HPI  from the history and physical done on the day of admission 06/30/2018 HPI: Brandi Hunter is a 77 y.o. 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    Sepsis secondary to UTI  -Met sepsis criteria on admission, as she was tachypneic, and elevated lactic acid, physiology of sepsis solved by the time of discharge. -Patient was treated for UTI before 3 weeks with ciprofloxacin, she presents with dysuria this time as well, positive urine analysis, urine culture growing E. coli, resistant to Bactrim and ampicillin, she was treated with Rocephin during hospital stay with resolution of her symptoms, given she failed Cipro as an outpatient, she will be discharged on [place] for another 6 days for total of 7 days treatment .  Acute kidney injury: - from volume depletion and dehydration, hydrated with IV fluids overnight, renal function back to baseline   Mobitz type 2 second degree  atrioventricular block S/p Pacemaker placement - Continue current medications no changes made.  Essential hypertension -Initially blood pressure soft, currently controlled, continue home medication on discharge  Chronic diastolic heart failure ([place]) -Hypovolemic on admission, kept on IV fluids, IV fluid has been stopped yesterday, she will be resumed on her home diuresis on discharge  Chronic obstructive pulmonary disease ([place]) Deemed to be stable, continue current medication  PAF (paroxysmal atrial fibrillation) [facility]): Paced rhythm, rate controlled continue metoprolol and Eliquis.  Normocytic anemia At baseline, further work-up per PCP  Diabetes mellitus -Resume home medication on discharge  Discharge Condition: Stable   Follow UP  Follow-up Information    [name], FNP Follow up in 1 week(s).   Specialty:  Nurse Practitioner Contact information: [address] [place] [address] [phone]        Thompson Grayer, MD .   Specialty:  Cardiology Contact information: [address] [place]  [address] [phone]             Discharge Instructions  and  Discharge Medications     Discharge Instructions    Discharge instructions   Complete by:  As directed    Follow with Primary MD [name], FNP in 7 days   Get CBC, CMP,  checked  by Primary MD next visit.    Activity: As tolerated with Full fall precautions use walker/cane & assistance as needed   Disposition Home    Diet: Heart Healthy , carbohydrate modified, with feeding assistance and aspiration precautions.  For Heart failure patients - Check your Weight same time everyday, if you gain over 2 pounds, or you develop in leg swelling, experience more shortness of breath or chest pain, call your Primary MD immediately. Follow Cardiac Low Salt Diet and 1.5 lit/day fluid restriction.   On your next visit with your primary care physician  please Get Medicines reviewed and adjusted.   Please request your Prim.MD to go over all Hospital Tests and Procedure/Radiological results at the follow up, please get all Hospital records sent to your Prim MD by signing hospital release before you go home.   If you experience worsening of your admission symptoms, develop shortness of breath, life threatening emergency, suicidal or homicidal thoughts you must seek medical attention immediately by calling 911 or calling your MD immediately  if symptoms less severe.  You Must read complete instructions/literature along with all the possible adverse reactions/side effects for all the Medicines you take and that have been prescribed to you. Take any new Medicines after you have completely understood and accpet all the possible adverse reactions/side effects.   Do not drive, operating heavy machinery, perform activities at heights, swimming or participation in water activities or provide baby sitting services if your were admitted for syncope or siezures until you have seen by Primary MD or a Neurologist and advised to do so again.  Do not drive when taking Pain medications.    Do not take more than prescribed Pain, Sleep and Anxiety Medications  Special Instructions: If you have smoked or chewed Tobacco  in the last 2 yrs please stop smoking, stop any regular Alcohol  and or any Recreational drug use.  Wear Seat belts while driving.   Please note  You were cared for by a hospitalist during your hospital stay. If you have any questions about your discharge medications or the care you received while you were in the hospital after you are discharged, you can call the unit and asked to speak with the hospitalist on call if the hospitalist that took care of you is not available. Once you are discharged, your primary care physician will handle any further medical issues. Please note that NO REFILLS for any discharge medications will be authorized once  you are discharged, as it is imperative that you return to your primary care physician (or establish a relationship with a primary care physician if you do not have one) for your aftercare needs so that they can reassess your need for medications and monitor your lab values.   Increase activity slowly   Complete by:  As  directed      Allergies as of 07/02/2018      Reactions   Bee Venom Swelling   Latex Swelling   LATEX CATHETERS      Medication List    STOP taking these medications   enoxaparin 120 MG/0.8ML injection Commonly known as:  LOVENOX     TAKE these medications   ALIGN PO Take 1 capsule by mouth daily.   amLODipine 5 MG tablet Commonly known as:  NORVASC Take 1 tablet (5 mg total) by mouth daily.   apixaban 5 MG Tabs tablet Commonly known as:  ELIQUIS Take 1 tablet (5 mg total) by mouth 2 (two) times daily.   aspirin 81 MG chewable tablet Chew 81 mg by mouth daily.   atorvastatin 40 MG tablet Commonly known as:  LIPITOR Take 40 mg by mouth daily.   budesonide-formoterol 160-4.5 MCG/ACT inhaler Commonly known as:  SYMBICORT Inhale 2 puffs into the lungs 2 (two) times daily.   cefdinir 300 MG capsule Commonly known as:  OMNICEF Take 1 capsule (300 mg total) by mouth every 12 (twelve) hours.   fenofibrate 160 MG tablet Take 160 mg by mouth every evening.   furosemide 40 MG tablet Commonly known as:  LASIX Take 1 tablet (40 mg total) by mouth daily.   glipiZIDE 10 MG 24 hr tablet Commonly known as:  GLUCOTROL XL Take 20 mg by mouth daily with breakfast.   guaiFENesin 600 MG 12 hr tablet Commonly known as:  MUCINEX Take 600 mg by mouth 2 (two) times daily as needed for to loosen phlegm.   ipratropium-albuterol 0.5-2.5 (3) MG/3ML Soln Commonly known as:  DUONEB Take 3 mLs by nebulization 2 (two) times daily.   isosorbide mononitrate 30 MG 24 hr tablet Commonly known as:  IMDUR Take 0.5 tablets (15 mg total) by mouth daily.   lactobacillus  acidophilus & bulgar chewable tablet Chew 1 tablet by mouth 3 (three) times daily with meals.   LORazepam 0.5 MG tablet Commonly known as:  ATIVAN Take 0.25-0.5 mg by mouth 2 (two) times daily as needed for anxiety.   metFORMIN 1000 MG tablet Commonly known as:  GLUCOPHAGE Take 1 tablet (1,000 mg total) by mouth 2 (two) times daily with a meal.   methocarbamol 500 MG tablet Commonly known as:  ROBAXIN Take 1 tablet by mouth 2 (two) times daily as needed for muscle spasms.   metoprolol succinate 100 MG 24 hr tablet Commonly known as:  TOPROL-XL TAKE 1 TABLET BY MOUTH ONCE DAILY   nitroGLYCERIN 0.4 MG SL tablet Commonly known as:  NITROSTAT Place 1 tablet (0.4 mg total) under the tongue every 5 (five) minutes as needed for chest pain.   oxybutynin 10 MG 24 hr tablet Commonly known as:  DITROPAN-XL Take 10 mg by mouth daily.   PROAIR HFA 108 (90 Base) MCG/ACT inhaler Generic drug:  albuterol Inhale 2 puffs into the lungs every 4 (four) hours as needed for wheezing.   spironolactone 25 MG tablet Commonly known as:  ALDACTONE Take 1 tablet (25 mg total) by mouth daily.   telmisartan 80 MG tablet Commonly known as:  MICARDIS Take 80 mg by mouth daily.   traMADol 50 MG tablet Commonly known as:  ULTRAM Take 1 tablet by mouth every 6 (six) hours as needed for pain.   triamcinolone ointment 0.5 % Commonly known as:  KENALOG Apply 1 application topically 2 (two) times daily.         Diet and Activity recommendation:  See Discharge Instructions above   Consults obtained -  none   Major procedures and Radiology Reports - PLEASE review detailed and final reports for all details, in brief -      Dg Chest Port 1 View  Result Date: 06/30/2018 CLINICAL DATA:  Lightheadedness. EXAM: PORTABLE CHEST 1 VIEW COMPARISON:  Mar 23, 2018 FINDINGS: Stable right pacemaker. Stable cardiomegaly. Stable elevation the right hemidiaphragm. The hila and mediastinum are unchanged. No  pulmonary nodules, masses, or focal infiltrates. IMPRESSION: No active disease. Electronically Signed   By: Dorise Bullion III M.D   On: 06/30/2018 12:54    Micro Results     Recent Results (from the past 240 hour(s))  Blood Culture (routine x 2)     Status: None (Preliminary result)   Collection Time: 06/30/18  1:39 PM  Result Value Ref Range Status   Specimen Description BLOOD RIGHT HAND  Final   Special Requests   Final    BOTTLES DRAWN AEROBIC AND ANAEROBIC Blood Culture results may not be optimal due to an inadequate volume of blood received in culture bottles   Culture   Final    NO GROWTH < 24 HOURS Performed at [facility] [address] [place], [place] [address]    Report Status PENDING  Incomplete  Blood Culture (routine x 2)     Status: None (Preliminary result)   Collection Time: 06/30/18  1:54 PM  Result Value Ref Range Status   Specimen Description BLOOD RIGHT HAND  Final   Special Requests   Final    BOTTLES DRAWN AEROBIC AND ANAEROBIC Blood Culture results may not be optimal due to an inadequate volume of blood received in culture bottles   Culture   Final    NO GROWTH < 24 HOURS Performed at [facility], [place] [address] [place], [place] [address]    Report Status PENDING  Incomplete  Urine Culture     Status: Abnormal (Preliminary result)   Collection Time: 06/30/18  3:30 PM  Result Value Ref Range Status   Specimen Description URINE, RANDOM  Final   Special Requests NONE  Final   Culture (A)  Final    60,000 COLONIES/mL ESCHERICHIA COLI 40,000 COLONIES/mL PROTEUS MIRABILIS SUSCEPTIBILITIES TO FOLLOW Performed at [facility], [place] [address] [place], [place] [address]    Report Status PENDING  Incomplete   Organism ID, Bacteria ESCHERICHIA COLI (A)  Final      Susceptibility   Escherichia coli - MIC*    AMPICILLIN 16 INTERMEDIATE Intermediate     CEFAZOLIN <=4 SENSITIVE Sensitive     CEFTRIAXONE <=1 SENSITIVE Sensitive      CIPROFLOXACIN 1 SENSITIVE Sensitive     GENTAMICIN <=1 SENSITIVE Sensitive     IMIPENEM <=0.25 SENSITIVE Sensitive     NITROFURANTOIN <=16 SENSITIVE Sensitive     TRIMETH/SULFA >=320 RESISTANT Resistant     AMPICILLIN/SULBACTAM 4 SENSITIVE Sensitive     PIP/TAZO <=4 SENSITIVE Sensitive     Extended ESBL NEGATIVE Sensitive     * 60,000 COLONIES/mL ESCHERICHIA COLI       Today   Subjective:   Jnaya Butrick today has no headache,no chest abdominal pain,no new weakness tingling or numbness, feels much better wants to go home today.   Objective:   Blood pressure (!) 124/54, pulse 63, temperature 98 F (36.7 C), temperature source Oral, resp. rate 16, height '5\' 7"'  (1.702 m), weight 111.6 kg, SpO2 97 %.   Intake/Output Summary (  Last 24 hours) at 07/02/2018 1141 Last data filed at 07/02/2018 1002 Gross per 24 hour  Intake 240 ml  Output -  Net 240 ml    Exam Awake Alert, Oriented x 3, No new F.N deficits, Normal affect Symmetrical Chest wall movement, Good air movement bilaterally, CTAB RRR,No Gallops,Rubs or new Murmurs, No Parasternal Heave +ve B.Sounds, Abd Soft, Non tender,  No rebound -guarding or rigidity. No Cyanosis, Clubbing ,mild  pitting edema around the ankle, No new Rash or bruise  Data Review   CBC w Diff:  Lab Results  Component Value Date   WBC 6.9 07/02/2018   HGB 10.5 (L) 07/02/2018   HGB 13.3 10/20/2017   HCT 34.5 (L) 07/02/2018   HCT 40.0 10/20/2017   PLT 267 07/02/2018   PLT 229 10/20/2017   LYMPHOPCT 23 06/30/2018   MONOPCT 7 06/30/2018   EOSPCT 14 06/30/2018   BASOPCT 1 06/30/2018    CMP:  Lab Results  Component Value Date   NA 141 07/02/2018   NA 140 12/22/2017   K 3.8 07/02/2018   CL 108 07/02/2018   CO2 27 07/02/2018   BUN 16 07/02/2018   BUN 26 12/22/2017   CREATININE 1.00 07/02/2018   PROT 5.2 (L) 06/30/2018   PROT 6.1 05/03/2017   ALBUMIN 2.9 (L) 06/30/2018   ALBUMIN 4.1 05/03/2017   BILITOT 0.7 06/30/2018   BILITOT 0.5  05/03/2017   ALKPHOS 20 (L) 06/30/2018   AST 21 06/30/2018   ALT 14 06/30/2018  .   Total Time in preparing paper work, data evaluation and todays exam - 35 minutes  Phillips Climes M.D on 07/02/2018 at 11:41 AM  [facility]   Office  [phone]

## 2018-07-02 NOTE — Progress Notes (Signed)
Patient was discharged home by MD order; discharged instructions review and give to patient with care notes and prescriptions; IV DIC; skin intact; patient will be escorted to the car by nurse tech via wheelchair.  

## 2018-07-02 NOTE — Progress Notes (Signed)
Pt will like to have a code statue change. Pass on to day shift nurse to notified Physician.

## 2018-07-03 LAB — URINE CULTURE

## 2018-07-05 LAB — CULTURE, BLOOD (ROUTINE X 2)
Culture: NO GROWTH
Culture: NO GROWTH

## 2018-07-15 NOTE — Progress Notes (Signed)
[facility] [employer] PREP Weekly Session   Patient Details  Name: ELIOT BENCIVENGA MRN: [id number] Date of Birth: September 11, 1941 Age: 77 y.o. PCP: [name], FNP  Vitals:   07/15/18 1545  Weight: 245 lb (111.1 kg)    [facility] [employer] Weekly seesion - 07/15/18 1500      Weekly Session   Minutes exercised this week  120 minutes   90cardio/30strength     Things you are grateful for:"our class & the sharing & the laughter" Nutrition celebration:"eating out less" Barriers:"balance issues"  [name] 07/15/2018, 3:46 PM

## 2018-07-15 NOTE — Progress Notes (Signed)
[facility] [employer] PREP Weekly Session   Patient Details  Name: Brandi Hunter MRN: [id number] Date of Birth: [birth year] Age: 77 y.o. PCP: [name], FNP  Vitals:   07/06/18 1421  Weight: 242 lb 6.4 oz (110 kg)    [facility] [employer] Weekly seesion - 07/15/18 1400      Weekly Session   Minutes exercised this week  60 minutes   cardio     Things you are grateful for:"health" Nutrition celebrations:"was on a carb modified diet in hospital-trying to continue" Barriers:"2 day hospital stay"  [name] 07/15/2018, 2:22 PM

## 2018-07-18 ENCOUNTER — Other Ambulatory Visit: Payer: Self-pay

## 2018-07-18 ENCOUNTER — Observation Stay (HOSPITAL_COMMUNITY)
Admission: EM | Admit: 2018-07-18 | Discharge: 2018-07-20 | Disposition: A | Discharge status: Home / Self Care | Payer: Medicare HMO | Attending: Internal Medicine | Admitting: Internal Medicine

## 2018-07-18 ENCOUNTER — Emergency Department (HOSPITAL_COMMUNITY): Payer: Medicare HMO

## 2018-07-18 ENCOUNTER — Encounter (HOSPITAL_COMMUNITY): Payer: Self-pay

## 2018-07-18 DIAGNOSIS — I480 Paroxysmal atrial fibrillation: Secondary | ICD-10-CM | POA: Diagnosis not present

## 2018-07-18 DIAGNOSIS — Z79899 Other long term (current) drug therapy: Secondary | ICD-10-CM | POA: Diagnosis not present

## 2018-07-18 DIAGNOSIS — Z23 Encounter for immunization: Secondary | ICD-10-CM | POA: Insufficient documentation

## 2018-07-18 DIAGNOSIS — I10 Essential (primary) hypertension: Secondary | ICD-10-CM | POA: Diagnosis present

## 2018-07-18 DIAGNOSIS — K219 Gastro-esophageal reflux disease without esophagitis: Secondary | ICD-10-CM | POA: Diagnosis present

## 2018-07-18 DIAGNOSIS — J449 Chronic obstructive pulmonary disease, unspecified: Secondary | ICD-10-CM | POA: Diagnosis not present

## 2018-07-18 DIAGNOSIS — I2510 Atherosclerotic heart disease of native coronary artery without angina pectoris: Secondary | ICD-10-CM | POA: Diagnosis not present

## 2018-07-18 DIAGNOSIS — I5032 Chronic diastolic (congestive) heart failure: Secondary | ICD-10-CM | POA: Diagnosis not present

## 2018-07-18 DIAGNOSIS — Z7982 Long term (current) use of aspirin: Secondary | ICD-10-CM | POA: Insufficient documentation

## 2018-07-18 DIAGNOSIS — I110 Hypertensive heart disease with heart failure: Secondary | ICD-10-CM | POA: Insufficient documentation

## 2018-07-18 DIAGNOSIS — Z7984 Long term (current) use of oral hypoglycemic drugs: Secondary | ICD-10-CM | POA: Diagnosis not present

## 2018-07-18 DIAGNOSIS — G4733 Obstructive sleep apnea (adult) (pediatric): Secondary | ICD-10-CM | POA: Diagnosis present

## 2018-07-18 DIAGNOSIS — R079 Chest pain, unspecified: Principal | ICD-10-CM | POA: Diagnosis present

## 2018-07-18 DIAGNOSIS — I5033 Acute on chronic diastolic (congestive) heart failure: Secondary | ICD-10-CM | POA: Diagnosis present

## 2018-07-18 DIAGNOSIS — E1140 Type 2 diabetes mellitus with diabetic neuropathy, unspecified: Secondary | ICD-10-CM | POA: Diagnosis not present

## 2018-07-18 DIAGNOSIS — Z950 Presence of cardiac pacemaker: Secondary | ICD-10-CM | POA: Insufficient documentation

## 2018-07-18 DIAGNOSIS — E119 Type 2 diabetes mellitus without complications: Secondary | ICD-10-CM

## 2018-07-18 DIAGNOSIS — E782 Mixed hyperlipidemia: Secondary | ICD-10-CM | POA: Diagnosis present

## 2018-07-18 DIAGNOSIS — I4821 Permanent atrial fibrillation: Secondary | ICD-10-CM | POA: Diagnosis present

## 2018-07-18 HISTORY — DX: Type 2 diabetes mellitus without complications: E11.9

## 2018-07-18 HISTORY — DX: Essential (primary) hypertension: I10

## 2018-07-18 LAB — URINALYSIS, ROUTINE W REFLEX MICROSCOPIC
Bilirubin Urine: NEGATIVE
Glucose, UA: NEGATIVE mg/dL
HGB URINE DIPSTICK: NEGATIVE
Ketones, ur: NEGATIVE mg/dL
Nitrite: NEGATIVE
PROTEIN: NEGATIVE mg/dL
Specific Gravity, Urine: 1.015 (ref 1.005–1.030)
pH: 5 (ref 5.0–8.0)

## 2018-07-18 LAB — CBC WITH DIFFERENTIAL/PLATELET
BASOS ABS: 0 10*3/uL (ref 0.0–0.1)
BASOS PCT: 1 %
EOS ABS: 1.1 10*3/uL — AB (ref 0.0–0.7)
Eosinophils Relative: 14 %
HCT: 39.2 % (ref 36.0–46.0)
HEMOGLOBIN: 12.3 g/dL (ref 12.0–15.0)
Lymphocytes Relative: 23 %
Lymphs Abs: 1.8 10*3/uL (ref 0.7–4.0)
MCH: 28.1 pg (ref 26.0–34.0)
MCHC: 31.4 g/dL (ref 30.0–36.0)
MCV: 89.7 fL (ref 78.0–100.0)
MONO ABS: 0.6 10*3/uL (ref 0.1–1.0)
Monocytes Relative: 8 %
NEUTROS ABS: 4.3 10*3/uL (ref 1.7–7.7)
NEUTROS PCT: 54 %
Platelets: 338 10*3/uL (ref 150–400)
RBC: 4.37 MIL/uL (ref 3.87–5.11)
RDW: 14.2 % (ref 11.5–15.5)
WBC: 7.9 10*3/uL (ref 4.0–10.5)

## 2018-07-18 LAB — BASIC METABOLIC PANEL
ANION GAP: 8 (ref 5–15)
BUN: 21 mg/dL (ref 8–23)
CALCIUM: 9.3 mg/dL (ref 8.9–10.3)
CO2: 26 mmol/L (ref 22–32)
Chloride: 105 mmol/L (ref 98–111)
Creatinine, Ser: 1.41 mg/dL — ABNORMAL HIGH (ref 0.44–1.00)
GFR calc Af Amer: 41 mL/min — ABNORMAL LOW (ref >60)
GFR, EST NON AFRICAN AMERICAN: 35 mL/min — AB (ref >60)
Glucose, Bld: 61 mg/dL — ABNORMAL LOW (ref 70–99)
POTASSIUM: 4.5 mmol/L (ref 3.5–5.1)
SODIUM: 139 mmol/L (ref 135–145)

## 2018-07-18 LAB — GLUCOSE, CAPILLARY: Glucose-Capillary: 124 mg/dL — ABNORMAL HIGH (ref 70–99)

## 2018-07-18 LAB — TROPONIN I

## 2018-07-18 MED ORDER — DULOXETINE HCL 60 MG PO CPEP
60.0000 mg | ORAL_CAPSULE | Freq: Every day | ORAL | Status: DC
  Administered 2018-07-18 – 2018-07-20 (×3): 60 mg via ORAL
  Filled 2018-07-18 (×3): qty 1

## 2018-07-18 MED ORDER — ACETAMINOPHEN 325 MG PO TABS: 650.0000 mg | ORAL_TABLET | ORAL | Status: DC | PRN

## 2018-07-18 MED ORDER — ASPIRIN 81 MG PO CHEW: 324.0000 mg | CHEWABLE_TABLET | Freq: Once | ORAL | Status: DC

## 2018-07-18 MED ORDER — SPIRONOLACTONE 25 MG PO TABS
25.0000 mg | ORAL_TABLET | Freq: Every day | ORAL | Status: DC
  Administered 2018-07-19 – 2018-07-20 (×2): 25 mg via ORAL
  Filled 2018-07-18 (×2): qty 1

## 2018-07-18 MED ORDER — ONDANSETRON HCL 4 MG/2ML IJ SOLN
4.0000 mg | Freq: Four times a day (QID) | INTRAMUSCULAR | Status: DC | PRN
  Administered 2018-07-19 (×2): 4 mg via INTRAVENOUS
  Filled 2018-07-18 (×2): qty 2

## 2018-07-18 MED ORDER — MOMETASONE FURO-FORMOTEROL FUM 200-5 MCG/ACT IN AERO
2.0000 | INHALATION_SPRAY | Freq: Two times a day (BID) | RESPIRATORY_TRACT | Status: DC
  Administered 2018-07-19 – 2018-07-20 (×3): 2 via RESPIRATORY_TRACT
  Filled 2018-07-18 (×2): qty 8.8

## 2018-07-18 MED ORDER — GLIPIZIDE ER 5 MG PO TB24
20.0000 mg | ORAL_TABLET | Freq: Every day | ORAL | Status: DC
  Administered 2018-07-19: 20 mg via ORAL
  Filled 2018-07-18: qty 4

## 2018-07-18 MED ORDER — NITROGLYCERIN 0.4 MG SL SUBL
0.4000 mg | SUBLINGUAL_TABLET | SUBLINGUAL | Status: DC | PRN
  Administered 2018-07-18: 0.4 mg via SUBLINGUAL
  Filled 2018-07-18: qty 1

## 2018-07-18 MED ORDER — ATORVASTATIN CALCIUM 40 MG PO TABS
40.0000 mg | ORAL_TABLET | Freq: Every day | ORAL | Status: DC
  Administered 2018-07-18 – 2018-07-20 (×3): 40 mg via ORAL
  Filled 2018-07-18 (×3): qty 1

## 2018-07-18 MED ORDER — SULFAMETHOXAZOLE-TRIMETHOPRIM 800-160 MG PO TABS
1.0000 | ORAL_TABLET | Freq: Two times a day (BID) | ORAL | Status: DC
  Administered 2018-07-18 – 2018-07-20 (×4): 1 via ORAL
  Filled 2018-07-18 (×4): qty 1

## 2018-07-18 MED ORDER — ENOXAPARIN SODIUM 40 MG/0.4ML ~~LOC~~ SOLN: 40.0000 mg | SUBCUTANEOUS | Status: DC

## 2018-07-18 MED ORDER — LACTINEX PO CHEW
1.0000 | CHEWABLE_TABLET | Freq: Three times a day (TID) | ORAL | Status: DC
  Administered 2018-07-19 – 2018-07-20 (×5): 1 via ORAL
  Filled 2018-07-18 (×5): qty 1

## 2018-07-18 MED ORDER — ASPIRIN 81 MG PO CHEW
81.0000 mg | CHEWABLE_TABLET | Freq: Every day | ORAL | Status: DC
  Administered 2018-07-19 – 2018-07-20 (×2): 81 mg via ORAL
  Filled 2018-07-18 (×2): qty 1

## 2018-07-18 MED ORDER — TRIAMCINOLONE ACETONIDE 0.5 % EX OINT
1.0000 "application " | TOPICAL_OINTMENT | Freq: Two times a day (BID) | CUTANEOUS | Status: DC
  Filled 2018-07-18: qty 15

## 2018-07-18 MED ORDER — OXYBUTYNIN CHLORIDE ER 5 MG PO TB24
10.0000 mg | ORAL_TABLET | Freq: Every day | ORAL | Status: DC
  Administered 2018-07-18 – 2018-07-20 (×3): 10 mg via ORAL
  Filled 2018-07-18 (×3): qty 2

## 2018-07-18 MED ORDER — LORAZEPAM 0.5 MG PO TABS: 0.2500 mg | ORAL_TABLET | Freq: Two times a day (BID) | ORAL | Status: DC | PRN

## 2018-07-18 MED ORDER — METOPROLOL SUCCINATE ER 50 MG PO TB24
100.0000 mg | ORAL_TABLET | Freq: Every day | ORAL | Status: DC
  Administered 2018-07-18 – 2018-07-20 (×3): 100 mg via ORAL
  Filled 2018-07-18 (×3): qty 2

## 2018-07-18 MED ORDER — AMLODIPINE BESYLATE 5 MG PO TABS
5.0000 mg | ORAL_TABLET | Freq: Every day | ORAL | Status: DC
  Administered 2018-07-19 – 2018-07-20 (×2): 5 mg via ORAL
  Filled 2018-07-18 (×2): qty 1

## 2018-07-18 MED ORDER — ISOSORBIDE MONONITRATE ER 30 MG PO TB24
15.0000 mg | ORAL_TABLET | Freq: Every day | ORAL | Status: DC
  Administered 2018-07-19: 15 mg via ORAL
  Filled 2018-07-18 (×2): qty 1

## 2018-07-18 MED ORDER — TRAMADOL HCL 50 MG PO TABS: 50.0000 mg | ORAL_TABLET | Freq: Four times a day (QID) | ORAL | Status: DC | PRN

## 2018-07-18 MED ORDER — IRBESARTAN 300 MG PO TABS
300.0000 mg | ORAL_TABLET | Freq: Every day | ORAL | Status: DC
  Administered 2018-07-18 – 2018-07-20 (×3): 300 mg via ORAL
  Filled 2018-07-18 (×3): qty 1

## 2018-07-18 MED ORDER — FENOFIBRATE 160 MG PO TABS
160.0000 mg | ORAL_TABLET | Freq: Every evening | ORAL | Status: DC
  Administered 2018-07-18 – 2018-07-19 (×2): 160 mg via ORAL
  Filled 2018-07-18 (×2): qty 1

## 2018-07-18 MED ORDER — IPRATROPIUM-ALBUTEROL 0.5-2.5 (3) MG/3ML IN SOLN: 3.0000 mL | Freq: Four times a day (QID) | RESPIRATORY_TRACT | Status: DC | PRN

## 2018-07-18 MED ORDER — METFORMIN HCL 500 MG PO TABS
1000.0000 mg | ORAL_TABLET | Freq: Two times a day (BID) | ORAL | Status: DC
  Administered 2018-07-19: 1000 mg via ORAL
  Filled 2018-07-18: qty 2

## 2018-07-18 MED ORDER — FUROSEMIDE 40 MG PO TABS
40.0000 mg | ORAL_TABLET | Freq: Every day | ORAL | Status: DC
  Administered 2018-07-19 – 2018-07-20 (×2): 40 mg via ORAL
  Filled 2018-07-18 (×2): qty 1

## 2018-07-18 MED ORDER — APIXABAN 5 MG PO TABS
5.0000 mg | ORAL_TABLET | Freq: Two times a day (BID) | ORAL | Status: DC
  Administered 2018-07-18 – 2018-07-20 (×4): 5 mg via ORAL
  Filled 2018-07-18 (×4): qty 1

## 2018-07-18 NOTE — ED Provider Notes (Signed)
[facility] EMERGENCY DEPARTMENT Provider Note   CSN: [id number] Arrival date & time: 07/18/18  1602     History   Chief Complaint Chief Complaint  Patient presents with  . Chest Pain    HPI Brandi Hunter is a 77 y.o. female.  HPI  Pt was seen at 1640. Per pt, c/o gradual onset and persistence of waxing and waning chest "pain" since early this morning. Has been associated with SOB. Describes the CP as "pressure," "heaviness" and "tightness," worsens with exertion, improves with resting. Pt states she used her home neb without improvement. Pt was evaluated at [facility] P[facility], given another neb treatment, as well as ASA without improvement of her symptoms. Denies palpitations, no change in chronic cough, no back pain, no abd pain, no N/V/D, no injury, no fevers, no rash.    Past Medical History:  Diagnosis Date  . Asthma   . Chronic anticoagulation   . COPD (chronic obstructive pulmonary disease) ([place])   . Diabetes mellitus without complication ([place])    TYPE 2  . DJD (degenerative joint disease)   . GERD (gastroesophageal reflux disease)   . History of home oxygen therapy    oxygen 2 liters per nasal cannula at hs  . HTN (hypertension)   . Hyperlipidemia   . Mobitz (type) II atrioventricular block   . Obesity   . Pacemaker    Implanted  by Dr [name] (MDT) 10/06/10  . PAF (paroxysmal atrial fibrillation) ([place])   . Pancreatitis 2010 OR 2011   pt thought this had been ruled out  . Sleep apnea    CPA SETTING OF 14    Patient Active Problem List   Diagnosis Date Noted  . Sepsis due to urinary tract infection ([place]) 06/30/2018  . AKI (acute kidney injury) ([place]) 06/30/2018  . Sepsis, unspecified organism ([place]) 06/30/2018  . Normocytic anemia 06/30/2018  . Coronary artery disease due to lipid rich plaque   . Unstable angina ([place]) 03/23/2018  . Acute respiratory distress 09/26/2017  . Acute exacerbation of chronic obstructive pulmonary disease (COPD) ([place]) 09/26/2017  . Dependence  on nocturnal oxygen therapy 09/26/2017  . Type 2 diabetes mellitus ([place]) 09/26/2017  . Chronic diastolic heart failure ([place]) 09/26/2017  . Cardiac pacemaker 11/17/2016  . Eczema 11/17/2016  . Major depression, recurrent, chronic ([place]) 09/17/2016  . Axillary abscess 07/22/2016  . Chronic left shoulder pain 10/01/2015  . Xerosis cutis 10/01/2015  . Paroxysmal atrial fibrillation ([place]) 05/24/2014  . PAF (paroxysmal atrial fibrillation) ([place]) 05/24/2014  . NAFLD (nonalcoholic fatty liver disease) 03/29/2014  . Renal cyst 03/29/2014  . Elevated TSH 03/06/2014  . Type 2 diabetes mellitus without complication, without long-term current use of insulin ([place]) 03/06/2014  . Peripheral neuropathy 04/27/2013  . Vitamin B12 deficiency 04/27/2013  . Abnormality of gait 03/29/2013  . Diabetic neuropathy ([place]) 03/29/2013  . OSA (obstructive sleep apnea) 08/26/2012  . Chronic asthmatic bronchitis ([place]) 08/26/2012  . Preop cardiovascular exam 08/15/2012  . Nocturnal hypoxia 04/08/2012  . Seborrheic dermatitis 10/21/2011  . IBS (irritable bowel syndrome) 05/27/2011  . Esophageal reflux 03/27/2011  . Mixed hyperlipidemia 03/27/2011  . Chronic obstructive pulmonary disease ([place]) 03/27/2011  . 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 Essential hypertension 02/09/2011  . Obesity 02/09/2011  . Tobacco use disorder 02/05/2011  . Vitamin D deficiency 12/30/2010  . Osteoarthrosis involving lower leg 12/26/2010  . Allergic rhinitis 01/23/2009  . Mixed incontinence 10/08/2008    Past Surgical History:  Procedure Laterality Date  . CATARACT EXTRACTION W/PHACO  11/02/2011   Procedure: CATARACT EXTRACTION PHACO AND INTRAOCULAR LENS PLACEMENT (IOC);  Surgeon: Williams Che;  Location: [facility];  Service: Ophthalmology;  Laterality: Right;  CDE=7.33  . CATARACT EXTRACTION W/PHACO  12/07/2011   Procedure: CATARACT EXTRACTION PHACO AND INTRAOCULAR LENS PLACEMENT  (IOC);  Surgeon: Williams Che, MD;  Location: [facility];  Service: Ophthalmology;  Laterality: Left;  CDE 3.61  . COLONOSCOPY WITH PROPOFOL N/A 08/09/2014   Procedure: COLONOSCOPY WITH PROPOFOL;  Surgeon: Juanita Craver, MD;  Location: [facility];  Service: Endoscopy;  Laterality: N/A;  . CYSTOCELE REPAIR    . ESOPHAGOGASTRODUODENOSCOPY (EGD) WITH PROPOFOL N/A 08/09/2014   Procedure: ESOPHAGOGASTRODUODENOSCOPY (EGD) WITH PROPOFOL;  Surgeon: Juanita Craver, MD;  Location: [facility];  Service: Endoscopy;  Laterality: N/A;  . EYE SURGERY  11/01/2012   BOTH EYES CATARACTS  . INSERT / REPLACE / REMOVE PACEMAKER  10/06/10   MDT  implanted by Dr [name]  . KNEE ARTHROSCOPY     both  . LEFT HEART CATH AND CORONARY ANGIOGRAPHY N/A 03/24/2018   Procedure: LEFT HEART CATH AND CORONARY ANGIOGRAPHY;  Surgeon: Lorretta Harp, MD;  Location: [place] [facility];  Service: Cardiovascular;  Laterality: N/A;  . OVARY SURGERY     removal  . REPAIR RECTOCELE    . SIMPLE MASTECTOMY WITH AXILLARY SENTINEL NODE BIOPSY Left 01/09/2015   Procedure: Irrigation and Drainage Abcess left Axilla;  Surgeon: Jackolyn Confer, MD;  Location: [facility];  Service: General;  Laterality: Left;  . TONSILLECTOMY  AGE 46  . TOTAL ABDOMINAL HYSTERECTOMY  1971     OB History    Gravida  3   Para  3   Term  3   Preterm      AB      Living  3     SAB      TAB      Ectopic      Multiple      Live Births               Home Medications    Prior to Admission medications   Medication Sig Start Date End Date Taking? Authorizing Provider  albuterol (PROAIR HFA) 108 (90 BASE) MCG/ACT inhaler Inhale 2 puffs into the lungs every 4 (four) hours as needed for wheezing.  07/21/12  Yes [provider]  amLODipine (NORVASC) 5 MG tablet Take 1 tablet (5 mg total) by mouth daily. 12/22/17  Yes [name], NP  apixaban (ELIQUIS) 5 MG TABS tablet Take 1 tablet (5 mg total) by mouth 2 (two) times daily. 03/25/18  Yes  Isaiah Serge, NP  aspirin 81 MG chewable tablet Chew 81 mg by mouth daily.    Yes [provider]  atorvastatin (LIPITOR) 40 MG tablet Take 40 mg by mouth daily.  03/17/13  Yes [provider]  budesonide-formoterol (SYMBICORT) 160-4.5 MCG/ACT inhaler Inhale 2 puffs into the lungs 2 (two) times daily.   Yes [provider]  DULoxetine (CYMBALTA) 60 MG capsule Take 60 mg by mouth daily.   Yes [provider]  fenofibrate 160 MG tablet Take 160 mg by mouth every evening.   Yes [provider]  furosemide (LASIX) 40 MG tablet Take 1 tablet (40 mg total) by mouth daily. 12/22/17 12/22/18 Yes [name], NP  glipiZIDE (GLUCOTROL XL) 10 MG 24 hr tablet Take 20 mg by mouth daily with breakfast.  08/20/13  Yes [provider]  ipratropium-albuterol (DUONEB) 0.5-2.5 (3) MG/3ML SOLN Take 3 mLs by nebulization 2 (two) times daily.   Yes [provider]  isosorbide mononitrate (IMDUR) 30 MG 24 hr tablet Take 0.5 tablets (15 mg total) by mouth daily. 03/24/18  Yes Isaiah Serge, NP  lactobacillus acidophilus & bulgar (LACTINEX) chewable tablet Chew 1 tablet by mouth 3 (three) times daily with meals. 07/02/18  Yes Elgergawy, Silver Huguenin, MD  LORazepam (ATIVAN) 0.5 MG tablet Take 0.25-0.5 mg by mouth 2 (two) times daily as needed for anxiety.  04/28/13  Yes [provider]  metFORMIN (GLUCOPHAGE) 1000 MG tablet Take 1 tablet (1,000 mg total) by mouth 2 (two) times daily with a meal. 03/27/18  Yes Isaiah Serge, NP  metoprolol succinate (TOPROL-XL) 100 MG 24 hr tablet TAKE 1 TABLET BY MOUTH ONCE DAILY Patient taking differently: Take 100 mg by mouth daily.  10/21/17  Yes [name], [name], MD  naproxen (NAPROSYN) 500 MG tablet Take 500 mg by mouth 2 (two) times daily with a meal.   Yes [provider]  oxybutynin (DITROPAN-XL) 10 MG 24 hr tablet Take 10 mg by mouth daily.  07/19/13  Yes [provider]  Probiotic Product (ALIGN PO)  Take 1 capsule by mouth daily.    Yes [provider]  spironolactone (ALDACTONE) 25 MG tablet Take 1 tablet (25 mg total) by mouth daily. 11/01/17  Yes [name], [name], MD  sulfamethoxazole-trimethoprim (BACTRIM DS,SEPTRA DS) 800-160 MG tablet Take 1 tablet by mouth 2 (two) times daily. for 7 days Starting 07/15/2018 07/15/18  Yes [provider]  telmisartan (MICARDIS) 80 MG tablet Take 80 mg by mouth daily.  02/25/18  Yes [provider]  traMADol (ULTRAM) 50 MG tablet Take 1 tablet by mouth every 6 (six) hours as needed for pain. 10/24/16  Yes [provider]  triamcinolone ointment (KENALOG) 0.5 % Apply 1 application topically 2 (two) times daily.   Yes [provider]  cefdinir (OMNICEF) 300 MG capsule Take 1 capsule (300 mg total) by mouth every 12 (twelve) hours. Patient not taking: Reported on 07/18/2018 07/02/18   Elgergawy, Silver Huguenin, MD  nitroGLYCERIN (NITROSTAT) 0.4 MG SL tablet Place 1 tablet (0.4 mg total) under the tongue every 5 (five) minutes as needed for chest pain. 05/03/17 06/30/18  [name], NP    Family History Family History  Problem Relation Age of Onset  . Congestive Heart Failure Mother   . Congestive Heart Failure Father   . Osteoarthritis Sister   . Prostate cancer Brother   . Diabetes Brother   . Anesthesia problems Neg Hx   . Hypotension Neg Hx   . Malignant hyperthermia Neg Hx   . Pseudochol deficiency Neg Hx     Social History Social History   Tobacco Use  . Smoking status: Former Smoker    Packs/day: 0.00    Years: 54.00    Pack years: 0.00    Types: Cigarettes    Last attempt to quit: 09/22/2017    Years since quitting: 0.8  . Smokeless tobacco: Former Systems developer    Quit date: 11/17/2011  Substance Use Topics  . Alcohol use: Not Currently    Alcohol/week: 1.0 standard drinks    Types: 1 Glasses of wine per week    Comment: couple glasses of wine occasionally-once a month  . Drug use: No     Allergies     Bee venom and Latex   Review of Systems Review of Systems  ROS: Statement: All systems negative except as marked or noted in the HPI; Constitutional: Negative for fever and chills. ; ; Eyes: Negative for eye pain, redness and discharge. ; ; ENMT: Negative for ear pain, hoarseness, nasal congestion, sinus pressure and sore throat. ; ; Cardiovascular: +CP, SOB. Negative for palpitations, diaphoresis, and peripheral edema. ; ; Respiratory: Negative for cough, wheezing and stridor. ; ; Gastrointestinal: Negative for nausea, vomiting, diarrhea, abdominal pain, blood in stool, hematemesis, jaundice and rectal bleeding. . ; ; Genitourinary: Negative for dysuria, flank pain and hematuria. ; ; Musculoskeletal: Negative for back pain and neck pain. Negative for swelling and trauma.; ; Skin: Negative for pruritus, rash, abrasions, blisters, bruising and skin lesion.; ; Neuro: Negative for headache, lightheadedness and neck stiffness. Negative for weakness, altered level of consciousness, altered mental status, extremity weakness, paresthesias, involuntary movement, seizure and syncope.      Physical Exam Updated Vital Signs BP 121/65 (BP Location: Right Arm)   Pulse 68   Temp 98.3 F (36.8 C) (Oral)   Resp 16   Ht 5\' 7"  (1.702 m)   Wt 111.1 kg   SpO2 96%   BMI 38.37 kg/m   Physical Exam 1645: Physical examination:  Nursing notes reviewed; Vital signs and O2 SAT reviewed;  Constitutional: Well developed, Well nourished, Well hydrated, In no acute distress; Head:  Normocephalic, atraumatic; Eyes: EOMI, PERRL, No scleral icterus; ENMT: Mouth and pharynx normal, Mucous membranes moist; Neck: Supple, Full range of motion, No lymphadenopathy; Cardiovascular: Regular rate and rhythm, No gallop; Respiratory: Breath sounds clear & equal bilaterally, No wheezes.  Speaking full sentences with ease, Normal respiratory effort/excursion; Chest: Nontender, Movement normal; Abdomen: Soft, Nontender, Nondistended,  Normal bowel sounds; Genitourinary: No CVA tenderness; Extremities: Peripheral pulses normal, No tenderness, No edema, No calf edema or asymmetry.; Neuro: AA&Ox3, Major CN grossly intact.  Speech clear. No gross focal motor or sensory deficits in extremities.; Skin: Color normal, Warm, Dry.   ED Treatments / Results  Labs (all labs ordered are listed, but only abnormal results are displayed)   EKG EKG Interpretation  Date/Time:  Monday July 18 2018 16:06:22 EDT Ventricular Rate:  84 PR Interval:    QRS Duration: 127 QT Interval:  406 QTC Calculation: 480 R Axis:   -87 Text Interpretation:  Atrial-ventricular dual-paced rhythm No further analysis attempted due to paced rhythm Baseline wander When compared with ECG of 06/30/2018 No significant change was found Confirmed by Francine Graven [phone]) on 07/18/2018 5:46:31 PM   Radiology   Procedures Procedures (including critical care time)  Medications Ordered in ED Medications  nitroGLYCERIN (NITROSTAT) SL tablet 0.4 mg (0.4 mg Sublingual Given 07/18/18 1827)     Initial Impression / Assessment and Plan / ED Course  I have reviewed the triage vital signs and the nursing notes.  Pertinent labs & imaging results that were available during my care of the patient were reviewed by me and considered in my medical decision making (see chart for details).  MDM Reviewed: previous chart, nursing note and vitals Reviewed previous: labs and ECG Interpretation: labs, ECG and x-ray   Results for orders placed or performed during the hospital encounter of 06/30/47  Basic metabolic panel  Result Value Ref Range   Sodium 139 135 - 145 mmol/L   Potassium 4.5 3.5 - 5.1 mmol/L   Chloride 105 98 - 111 mmol/L   CO2 26 22 - 32 mmol/L   Glucose, Bld 61 (L) 70 - 99 mg/dL   BUN 21 8 -  23 mg/dL   Creatinine, Ser 1.41 (H) 0.44 - 1.00 mg/dL   Calcium 9.3 8.9 - 10.3 mg/dL   GFR calc non Af Amer 35 (L) >60 mL/min   GFR calc Af Amer 41 (L) >60  mL/min   Anion gap 8 5 - 15  Troponin I  Result Value Ref Range   Troponin I <0.03 <0.03 ng/mL  CBC with Differential/Platelet  Result Value Ref Range   WBC 7.9 4.0 - 10.5 K/uL   RBC 4.37 3.87 - 5.11 MIL/uL   Hemoglobin 12.3 12.0 - 15.0 g/dL   HCT 39.2 36.0 - 46.0 %   MCV 89.7 78.0 - 100.0 fL   MCH 28.1 26.0 - 34.0 pg   MCHC 31.4 30.0 - 36.0 g/dL   RDW 14.2 11.5 - 15.5 %   Platelets 338 150 - 400 K/uL   Neutrophils Relative % 54 %   Neutro Abs 4.3 1.7 - 7.7 K/uL   Lymphocytes Relative 23 %   Lymphs Abs 1.8 0.7 - 4.0 K/uL   Monocytes Relative 8 %   Monocytes Absolute 0.6 0.1 - 1.0 K/uL   Eosinophils Relative 14 %   Eosinophils Absolute 1.1 (H) 0.0 - 0.7 K/uL   Basophils Relative 1 %   Basophils Absolute 0.0 0.0 - 0.1 K/uL   Dg Chest 2 View Result Date: 07/18/2018 CLINICAL DATA:  77 year old female with shortness of breath since yesterday. Chest tightness today. EXAM: CHEST - 2 VIEW COMPARISON:  Chest x-ray 06/30/2018. FINDINGS: Elevated right hemidiaphragm. Lung volumes are normal. No consolidative airspace disease. No pleural effusions. No pneumothorax. No pulmonary nodule or mass noted. Pulmonary vasculature and the cardiomediastinal silhouette are within normal limits. Right-sided pacemaker device with lead tips projecting over the expected location of the right atrium and right ventricle. Old L1 vertebral body compression fracture with 20% loss of anterior vertebral body height. IMPRESSION: 1.  No radiographic evidence of acute cardiopulmonary disease. Electronically Signed   By: Vinnie Langton M.D.   On: 07/18/2018 17:35    1930:  EKG unchanged from previous and troponin reassuring. Pt already received ASA PTA, SL ntg given here with improvement of symptoms. Concerning HPI, will observation admit. Dx and testing d/w pt and family.  Questions answered.  Verb understanding, agreeable to admit.  T/C returned from [facility]. Olevia Bowens, case discussed, including:  HPI, pertinent PM/SHx,  VS/PE, dx testing, ED course and treatment:  Agreeable to admit.    Final Clinical Impressions(s) / ED Diagnoses   Final diagnoses:  None    ED Discharge Orders    None       Francine Graven, DO 07/19/18 2129

## 2018-07-18 NOTE — ED Notes (Signed)
Patient given graham crackers and gingerale 

## 2018-07-18 NOTE — ED Triage Notes (Signed)
Pt brought to ED via [facility] for c/o Chest pain which started about 0200 with tightness in her chest. Pt c/o SOB which started yesterday. Pt took ASA 324 mg prior to arrival with EMS.

## 2018-07-18 NOTE — H&P (Signed)
History and Physical    Brandi Hunter ATF:[id number] DOB: 05/14/1941 DOA: 07/18/2018  PCP: [name], FNP   Patient coming from: Home.  I have personally briefly reviewed patient's old medical records in [place]  Chief Complaint: Chest pain.  HPI: Brandi Hunter is a 77 y.o. female with medical history significant of asthma, chronic coagulation, COPD, type 2 diabetes, DJD, GERD, hypertension, hyperlipidemia, history of Mobitz type II heart block, history of pacemaker placement, obesity, paroxysmal atrial fibrillation, history of pancreatitis, sleep apnea on CPAP who is coming to the emergency department due to chest pain.  Per patient, since yesterday she has been having on and off pressure-like chest pain, non-radiated, worsened by exertion, not associated with dyspnea, lightheadedness, but denies palpitations, diaphoresis or nausea.  She used some treatments at home without significant relief.  She took aspirin at home.  She was seen at [facility], was given more aspirin and referred to the ED.  However, today at 1400 the tightness became very intense so she decided to come to the ED.  She denies PND, orthopnea, but occasionally gets lower extremity edema.  No fever, chills, sore throat, cough, abdominal pain, diarrhea, constipation, melena or hematochezia.  No dysuria, frequency or hematuria.  No polyuria, polydipsia, polyphagia or blurred vision.  ED Course: Vital signs temperature 98.3 F, pulse 81, respiration 19, blood pressure 122/66 mmHg and O2 sat 94% on room air.  She was given supplemental oxygen and nitroglycerin in the emergency department which relief her symptoms.  Her urinalysis was cloudy with trace leukocyte esterase and rare bacteria.  Her CBC was normal.  Troponin was normal.  BMP showed a glucose of 61 and creatinine of 1.41 mg/dL, but all other values were normal.  Chest radiograph did not have any acute cardiopulmonary pathology.  Review of Systems: As per HPI  otherwise 10 point review of systems negative.   Past Medical History:  Diagnosis Date  . Asthma   . Chronic anticoagulation   . COPD (chronic obstructive pulmonary disease) ([place])   . Diabetes mellitus without complication ([place])    TYPE 2  . DJD (degenerative joint disease)   . GERD (gastroesophageal reflux disease)   . History of home oxygen therapy    oxygen 2 liters per nasal cannula at hs  . HTN (hypertension)   . Hyperlipidemia   . Mobitz (type) II atrioventricular block   . Obesity   . Pacemaker    Implanted  by Dr [name] (MDT) 10/06/10  . PAF (paroxysmal atrial fibrillation) ([place])   . Pancreatitis 2010 OR 2011   pt thought this had been ruled out  . Sleep apnea    CPA SETTING OF 14    Past Surgical History:  Procedure Laterality Date  . CATARACT EXTRACTION W/PHACO  11/02/2011   Procedure: CATARACT EXTRACTION PHACO AND INTRAOCULAR LENS PLACEMENT (IOC);  Surgeon: Williams Che;  Location: [facility];  Service: Ophthalmology;  Laterality: Right;  CDE=7.33  . CATARACT EXTRACTION W/PHACO  12/07/2011   Procedure: CATARACT EXTRACTION PHACO AND INTRAOCULAR LENS PLACEMENT (IOC);  Surgeon: Williams Che, MD;  Location: [facility];  Service: Ophthalmology;  Laterality: Left;  CDE 3.61  . COLONOSCOPY WITH PROPOFOL N/A 08/09/2014   Procedure: COLONOSCOPY WITH PROPOFOL;  Surgeon: Juanita Craver, MD;  Location: [facility];  Service: Endoscopy;  Laterality: N/A;  . CYSTOCELE REPAIR    . ESOPHAGOGASTRODUODENOSCOPY (EGD) WITH PROPOFOL N/A 08/09/2014   Procedure: ESOPHAGOGASTRODUODENOSCOPY (EGD) WITH PROPOFOL;  Surgeon: Juanita Craver, MD;  Location: [facility];  Service: Endoscopy;  Laterality: N/A;  . EYE SURGERY  11/01/2012   BOTH EYES CATARACTS  . INSERT / REPLACE / REMOVE PACEMAKER  10/06/10   MDT  implanted by Dr [name]  . KNEE ARTHROSCOPY     both  . LEFT HEART CATH AND CORONARY ANGIOGRAPHY N/A 03/24/2018   Procedure: LEFT HEART CATH AND CORONARY ANGIOGRAPHY;  Surgeon: Lorretta Harp, MD;  Location: [place] CV LAB;  Service: Cardiovascular;  Laterality: N/A;  . OVARY SURGERY     removal  . REPAIR RECTOCELE    . SIMPLE MASTECTOMY WITH AXILLARY SENTINEL NODE BIOPSY Left 01/09/2015   Procedure: Irrigation and Drainage Abcess left Axilla;  Surgeon: Jackolyn Confer, MD;  Location: [facility];  Service: General;  Laterality: Left;  . TONSILLECTOMY  AGE 10  . TOTAL ABDOMINAL HYSTERECTOMY  1971     reports that she quit smoking about 9 months ago. Her smoking use included cigarettes. She smoked 0.00 packs per day for 54.00 years. She quit smokeless tobacco use about 6 years ago. She reports that she drank about 1.0 standard drinks of alcohol per week. She reports that she does not use drugs.  Allergies  Allergen Reactions  . Bee Venom Swelling  . Latex Swelling    LATEX CATHETERS    Family History  Problem Relation Age of Onset  . Congestive Heart Failure Mother   . Congestive Heart Failure Father   . Osteoarthritis Sister   . Prostate cancer Brother   . Diabetes Brother   . Anesthesia problems Neg Hx   . Hypotension Neg Hx   . Malignant hyperthermia Neg Hx   . Pseudochol deficiency Neg Hx     Prior to Admission medications   Medication Sig Start Date End Date Taking? Authorizing Provider  albuterol (PROAIR HFA) 108 (90 BASE) MCG/ACT inhaler Inhale 2 puffs into the lungs every 4 (four) hours as needed for wheezing.  07/21/12  Yes [provider]  amLODipine (NORVASC) 5 MG tablet Take 1 tablet (5 mg total) by mouth daily. 12/22/17  Yes [name], NP  apixaban (ELIQUIS) 5 MG TABS tablet Take 1 tablet (5 mg total) by mouth 2 (two) times daily. 03/25/18  Yes Isaiah Serge, NP  aspirin 81 MG chewable tablet Chew 81 mg by mouth daily.    Yes [provider]  atorvastatin (LIPITOR) 40 MG tablet Take 40 mg by mouth daily.  03/17/13  Yes [provider]  budesonide-formoterol (SYMBICORT) 160-4.5 MCG/ACT inhaler Inhale 2 puffs into the lungs 2  (two) times daily.   Yes [provider]  DULoxetine (CYMBALTA) 60 MG capsule Take 60 mg by mouth daily.   Yes [provider]  fenofibrate 160 MG tablet Take 160 mg by mouth every evening.   Yes [provider]  furosemide (LASIX) 40 MG tablet Take 1 tablet (40 mg total) by mouth daily. 12/22/17 12/22/18 Yes [name], NP  glipiZIDE (GLUCOTROL XL) 10 MG 24 hr tablet Take 20 mg by mouth daily with breakfast.  08/20/13  Yes [provider]  ipratropium-albuterol (DUONEB) 0.5-2.5 (3) MG/3ML SOLN Take 3 mLs by nebulization 2 (two) times daily.   Yes [provider]  isosorbide mononitrate (IMDUR) 30 MG 24 hr tablet Take 0.5 tablets (15 mg total) by mouth daily. 03/24/18  Yes Isaiah Serge, NP  lactobacillus acidophilus & bulgar (LACTINEX) chewable tablet Chew 1 tablet by mouth 3 (three) times daily with meals. 07/02/18  Yes  Elgergawy, Silver Huguenin, MD  LORazepam (ATIVAN) 0.5 MG tablet Take 0.25-0.5 mg by mouth 2 (two) times daily as needed for anxiety.  04/28/13  Yes [provider]  metFORMIN (GLUCOPHAGE) 1000 MG tablet Take 1 tablet (1,000 mg total) by mouth 2 (two) times daily with a meal. 03/27/18  Yes Isaiah Serge, NP  metoprolol succinate (TOPROL-XL) 100 MG 24 hr tablet TAKE 1 TABLET BY MOUTH ONCE DAILY Patient taking differently: Take 100 mg by mouth daily.  10/21/17  Yes [name], [name], MD  naproxen (NAPROSYN) 500 MG tablet Take 500 mg by mouth 2 (two) times daily with a meal.   Yes [provider]  oxybutynin (DITROPAN-XL) 10 MG 24 hr tablet Take 10 mg by mouth daily.  07/19/13  Yes [provider]  Probiotic Product (ALIGN PO) Take 1 capsule by mouth daily.    Yes [provider]  spironolactone (ALDACTONE) 25 MG tablet Take 1 tablet (25 mg total) by mouth daily. 11/01/17  Yes [name], [name], MD  sulfamethoxazole-trimethoprim (BACTRIM DS,SEPTRA DS) 800-160 MG tablet Take 1 tablet by mouth 2 (two) times daily. for 7 days  Starting 07/15/2018 07/15/18  Yes [provider]  telmisartan (MICARDIS) 80 MG tablet Take 80 mg by mouth daily.  02/25/18  Yes [provider]  traMADol (ULTRAM) 50 MG tablet Take 1 tablet by mouth every 6 (six) hours as needed for pain. 10/24/16  Yes [provider]  triamcinolone ointment (KENALOG) 0.5 % Apply 1 application topically 2 (two) times daily.   Yes [provider]  cefdinir (OMNICEF) 300 MG capsule Take 1 capsule (300 mg total) by mouth every 12 (twelve) hours. Patient not taking: Reported on 07/18/2018 07/02/18   Elgergawy, Silver Huguenin, MD  nitroGLYCERIN (NITROSTAT) 0.4 MG SL tablet Place 1 tablet (0.4 mg total) under the tongue every 5 (five) minutes as needed for chest pain. 05/03/17 06/30/18  [name], NP    Physical Exam: Vitals:   07/18/18 1930 07/18/18 1945 07/18/18 2000 07/18/18 2040  BP:    (!) 154/69  Pulse: 63 60 63 72  Resp: 16 20 20 18   Temp:    98 F (36.7 C)  TempSrc:    Oral  SpO2: 93% 95% 98% 99%  Weight:      Height:        Constitutional: NAD, calm, comfortable Eyes: PERRL, lids and conjunctivae normal ENMT: Mucous membranes are moist. Posterior pharynx clear of any exudate or lesions.  Neck: normal, supple, no masses, no thyromegaly Respiratory: clear to auscultation bilaterally, no wheezing, no crackles. Normal respiratory effort. No accessory muscle use.  Cardiovascular: Regular rate and rhythm, no murmurs / rubs / gallops. No extremity edema. 2+ pedal pulses. No carotid bruits.  Abdomen: Obese, soft, no tenderness, no masses palpated. No hepatosplenomegaly. Bowel sounds positive.  Musculoskeletal: no clubbing / cyanosis. No joint deformity upper and lower extremities. Good ROM, no contractures. Normal muscle tone.  Skin: no rashes, lesions, ulcers on very limited dermatological examination. Neurologic: CN 2-12 grossly intact. Sensation intact, DTR normal. Strength 5/5 in all 4.  Psychiatric: Normal judgment and  insight. Alert and oriented x 3. Normal mood.    Labs on Admission: I have personally reviewed following labs and imaging studies  CBC: Recent Labs  Lab 07/18/18 1714  WBC 7.9  NEUTROABS 4.3  HGB 12.3  HCT 39.2  MCV 89.7  PLT 213   Basic Metabolic Panel: Recent Labs  Lab 07/18/18 1714  NA 139  K 4.5  CL 105  CO2 26  GLUCOSE 61*  BUN 21  CREATININE 1.41*  CALCIUM 9.3   GFR: Estimated Creatinine Clearance: 44.3 mL/min (A) (by C-G formula based on SCr of 1.41 mg/dL (H)). Liver Function Tests: No results for input(s): AST, ALT, ALKPHOS, BILITOT, PROT, ALBUMIN in the last 168 hours. No results for input(s): LIPASE, AMYLASE in the last 168 hours. No results for input(s): AMMONIA in the last 168 hours. Coagulation Profile: No results for input(s): INR, PROTIME in the last 168 hours. Cardiac Enzymes: Recent Labs  Lab 07/18/18 1714  TROPONINI <0.03   BNP (last 3 results) Recent Labs    10/20/17 1033  PROBNP 257   HbA1C: No results for input(s): HGBA1C in the last 72 hours. CBG: Recent Labs  Lab 07/18/18 2037  GLUCAP 124*   Lipid Profile: No results for input(s): CHOL, HDL, LDLCALC, TRIG, CHOLHDL, LDLDIRECT in the last 72 hours. Thyroid Function Tests: No results for input(s): TSH, T4TOTAL, FREET4, T3FREE, THYROIDAB in the last 72 hours. Anemia Panel: No results for input(s): VITAMINB12, FOLATE, FERRITIN, TIBC, IRON, RETICCTPCT in the last 72 hours. Urine analysis:    Component Value Date/Time   COLORURINE YELLOW 07/18/2018 2014   APPEARANCEUR CLOUDY (A) 07/18/2018 2014   LABSPEC 1.015 07/18/2018 2014   PHURINE 5.0 07/18/2018 2014   GLUCOSEU NEGATIVE 07/18/2018 2014   HGBUR NEGATIVE 07/18/2018 2014   [place] NEGATIVE 07/18/2018 2014   [place] 07/18/2018 2014   PROTEINUR NEGATIVE 07/18/2018 2014   UROBILINOGEN 1.0 05/11/2011 2020   NITRITE NEGATIVE 07/18/2018 2014   LEUKOCYTESUR TRACE (A) 07/18/2018 2014    Radiological Exams on  Admission: Dg Chest 2 View  Result Date: 07/18/2018 CLINICAL DATA:  77 year old female with shortness of breath since yesterday. Chest tightness today. EXAM: CHEST - 2 VIEW COMPARISON:  Chest x-ray 06/30/2018. FINDINGS: Elevated right hemidiaphragm. Lung volumes are normal. No consolidative airspace disease. No pleural effusions. No pneumothorax. No pulmonary nodule or mass noted. Pulmonary vasculature and the cardiomediastinal silhouette are within normal limits. Right-sided pacemaker device with lead tips projecting over the expected location of the right atrium and right ventricle. Old L1 vertebral body compression fracture with 20% loss of anterior vertebral body height. IMPRESSION: 1.  No radiographic evidence of acute cardiopulmonary disease. Electronically Signed   By: Vinnie Langton M.D.   On: 07/18/2018 17:35  1 12/23/2016 echo complete  ------------------------------------------------------------------- LV EF: 60% -   65%  ------------------------------------------------------------------- Indications:      (I50.32).  ------------------------------------------------------------------- History:   PMH:  Acquired from the patient and from the patient&'s chart.  Atrial fibrillation.  Congestive heart failure.  Chronic obstructive pulmonary disease.  Risk factors:  Former tobacco use. Hypertension. Diabetes mellitus. Morbidly obese.  ------------------------------------------------------------------- Study Conclusions  - Left ventricle: The cavity size was normal. There was moderate   concentric hypertrophy. Systolic function was normal. The   estimated ejection fraction was in the range of 60% to 65%. Wall   motion was normal; there were no regional wall motion   abnormalities. Features are consistent with a pseudonormal left   ventricular filling pattern, with concomitant abnormal relaxation   and increased filling pressure (grade 2 diastolic dysfunction). - Left atrium: The  atrium was mildly dilated. - Atrial septum: The septum bowed from left to right, consistent   with increased left atrial pressure. No defect or patent foramen   ovale was identified. - Pulmonary arteries: Systolic pressure was mildly increased. PA   peak pressure: 37 mm Hg (S).  EKG: Independently reviewed.  Vent. rate 84 BPM PR interval * ms QRS duration 127 ms QT/QTc 406/480 ms P-R-T axes 105 -87 91 Atrial-ventricular dual-paced rhythm No further analysis attempted due to paced rhythm Baseline wander  Assessment/Plan Principal Problem:   Chest pain Observation/telemetry. Continue supplemental oxygen. Trend troponin levels. Continue aspirin, atorvastatin, fenofibrate, metoprolol  Active Problems:   Essential hypertension  Continue amlodipine 5 mg p.o. daily. Continue furosemide 40 mg p.o. daily. On Avapro 300 mg/ p.o. day Continue metoprolol 100 mg p.o. daily. Monitor blood pressure, heart rate, renal function electrolytes.    OSA (obstructive sleep apnea) May use her home machine or 1 of ours.    Paroxysmal atrial fibrillation (HCC) CHA?DS?-VASc Score of at least 6. Continue apixaban and metoprolol.    Type 2 diabetes mellitus (HCC) Carbohydrate modified diet. Glipizide 20 mg p.o. daily. Continue metformin 1000 mg p.o. twice daily. CBG monitoring while in the hospital.    Chronic diastolic heart failure ([place]) No signs of decompensation. Continue furosemide 40 mg p.o. daily. Continue Avapro and metoprolol.    Esophageal reflux Protonix 40 mg p.o. daily    Mixed hyperlipidemia Continue atorvastatin 40 mg p.o. daily. Continue fenofibrate 160 mg p.o. daily.    DVT prophylaxis: On apixaban. Code Status: Full code. Family Communication:  Disposition Plan: Observation for chest pain. Consults called: Admission status:/Telemetry/observation.   Reubin Milan MD [facility] Pager [phone].  If 7PM-7AM, please contact  night-coverage [email or web address] Password [facility]  07/18/2018, 9:41 PM

## 2018-07-19 ENCOUNTER — Observation Stay (HOSPITAL_BASED_OUTPATIENT_CLINIC_OR_DEPARTMENT_OTHER): Payer: Medicare HMO

## 2018-07-19 DIAGNOSIS — I340 Nonrheumatic mitral (valve) insufficiency: Secondary | ICD-10-CM

## 2018-07-19 DIAGNOSIS — R079 Chest pain, unspecified: Secondary | ICD-10-CM | POA: Diagnosis not present

## 2018-07-19 DIAGNOSIS — K219 Gastro-esophageal reflux disease without esophagitis: Secondary | ICD-10-CM

## 2018-07-19 DIAGNOSIS — I10 Essential (primary) hypertension: Secondary | ICD-10-CM | POA: Diagnosis not present

## 2018-07-19 DIAGNOSIS — I5032 Chronic diastolic (congestive) heart failure: Secondary | ICD-10-CM | POA: Diagnosis not present

## 2018-07-19 LAB — ECHOCARDIOGRAM COMPLETE
Height: 67 in
WEIGHTICAEL: 3920 [oz_av]

## 2018-07-19 LAB — BASIC METABOLIC PANEL
ANION GAP: 6 (ref 5–15)
BUN: 20 mg/dL (ref 8–23)
CALCIUM: 8.9 mg/dL (ref 8.9–10.3)
CO2: 28 mmol/L (ref 22–32)
Chloride: 104 mmol/L (ref 98–111)
Creatinine, Ser: 1.25 mg/dL — ABNORMAL HIGH (ref 0.44–1.00)
GFR, EST AFRICAN AMERICAN: 48 mL/min — AB (ref >60)
GFR, EST NON AFRICAN AMERICAN: 41 mL/min — AB (ref >60)
Glucose, Bld: 155 mg/dL — ABNORMAL HIGH (ref 70–99)
Potassium: 5 mmol/L (ref 3.5–5.1)
Sodium: 138 mmol/L (ref 135–145)

## 2018-07-19 LAB — GLUCOSE, CAPILLARY
GLUCOSE-CAPILLARY: 95 mg/dL (ref 70–99)
GLUCOSE-CAPILLARY: 95 mg/dL (ref 70–99)
Glucose-Capillary: 144 mg/dL — ABNORMAL HIGH (ref 70–99)
Glucose-Capillary: 200 mg/dL — ABNORMAL HIGH (ref 70–99)

## 2018-07-19 LAB — TROPONIN I

## 2018-07-19 MED ORDER — TRIAMCINOLONE ACETONIDE 0.5 % EX CREA
TOPICAL_CREAM | Freq: Two times a day (BID) | CUTANEOUS | Status: DC
  Filled 2018-07-19: qty 15

## 2018-07-19 MED ORDER — BENZONATATE 100 MG PO CAPS: 200.0000 mg | ORAL_CAPSULE | Freq: Three times a day (TID) | ORAL | Status: DC | PRN

## 2018-07-19 NOTE — Care Management Obs Status (Signed)
[place] NOTIFICATION   Patient Details  Name: Brandi Hunter MRN: [id number] Date of Birth: Mar 16, 1941   Medicare Observation Status Notification Given:  Yes    Sherald Barge, RN 07/19/2018, 2:16 PM

## 2018-07-19 NOTE — Progress Notes (Signed)
*  PRELIMINARY RESULTS* Echocardiogram 2D Echocardiogram has been performed.  Brandi Hunter 07/19/2018, 3:53 PM

## 2018-07-19 NOTE — Progress Notes (Signed)
PROGRESS NOTE    Brandi Hunter  GGE:[id number] DOB: January 31, 1941 DOA: 07/18/2018 PCP: [name], FNP    Brief Narrative:  Brandi Hunter is a 77 y.o. female with medical history significant of asthma, chronic coagulation, COPD, type 2 diabetes, DJD, GERD, hypertension, hyperlipidemia, history of Mobitz type II heart block, history of pacemaker placement, obesity, paroxysmal atrial fibrillation, history of pancreatitis, sleep apnea on CPAP who is coming to the emergency department due to chest pain.  Chest pain is intermittent, substernal, associated with exertion. And some chest tightness too.   Assessment & Plan:   Principal Problem:   Chest pain Active Problems:   Essential hypertension   OSA (obstructive sleep apnea)   Paroxysmal atrial fibrillation (HCC)   Type 2 diabetes mellitus (HCC)   Chronic diastolic heart failure (HCC)   Esophageal reflux   Mixed hyperlipidemia   Chest pain;  Some typical and atypical features.  Comes with both exertion and tightness at rest sometimes.  Serial troponins negative.  EKG paced, did not see any ischemic changes.  Echocardiogram will be ordered.  Cardiac cath done on 03/2018 by Dr berry showed 90% ostial OM inferior branch stenosis.  Cardiology consulted for further recommendations.    GERD; stable.   Recently admitted and discharged for UTI:  On bactrim to complete the course.    Chronic diastolic heart failure:  She appears to be compensated.  She is not sob or orthopnea, only trace edema in the feet.  Resume lasix 40 mg daily.    Hypertension: well controlled.    Paroxysmal atrial fibrillation;  Rate controlled and is on eliquis for anti coagulation.   Type 2 DM: CBG (last 3)  Recent Labs    07/18/18 2037 07/19/18 0742 07/19/18 1131  GLUCAP 124* 144* 200*   Resume SSI. Holding oral anti hypoglycemic agents.    Hyperlipidemia;  Follow lipid panel in am.    DVT prophylaxis: Eliquis.  Code Status: full code.    Family Communication: none at bedside.  Disposition Plan: pending evaluation by cardiology.   Consultants:   Cardiology.   Procedures: None.   Antimicrobials: bactrim for UTI, 3 more days as per the patient.    Subjective: Pt reports intermittent chest pain and tightness with exertion,   Objective: Vitals:   07/18/18 2000 07/18/18 2040 07/19/18 0436 07/19/18 0801  BP:  (!) 154/69 (!) 149/52   Pulse: 63 72 60   Resp: 20 18    Temp:  98 F (36.7 C) 97.8 F (36.6 C)   TempSrc:  Oral Oral   SpO2: 98% 99% 98% 95%  Weight:      Height:        Intake/Output Summary (Last 24 hours) at 07/19/2018 1345 Last data filed at 07/19/2018 0900 Gross per 24 hour  Intake 240 ml  Output -  Net 240 ml   Filed Weights   07/18/18 1605  Weight: 111.1 kg    Examination:  General exam: Appears calm and comfortable  Respiratory system: Clear to auscultation. Respiratory effort normal. Cardiovascular system: S1 & S2 heard, RRR. No JVD, murmurs,  Gastrointestinal system: Abdomen is nondistended, soft and nontender. No organomegaly or masses felt. Normal bowel sounds heard. Central nervous system: Alert and oriented. No focal neurological deficits. Extremities: Symmetric 5 x 5 power. Skin: No rashes, lesions or ulcers Psychiatry:Mood & affect appropriate.     Data Reviewed: I have personally reviewed following labs and imaging studies  CBC: Recent Labs  Lab 07/18/18 1714  WBC 7.9  NEUTROABS 4.3  HGB 12.3  HCT 39.2  MCV 89.7  PLT 517   Basic Metabolic Panel: Recent Labs  Lab 07/18/18 1714 07/19/18 0549  NA 139 138  K 4.5 5.0  CL 105 104  CO2 26 28  GLUCOSE 61* 155*  BUN 21 20  CREATININE 1.41* 1.25*  CALCIUM 9.3 8.9   GFR: Estimated Creatinine Clearance: 50 mL/min (A) (by C-G formula based on SCr of 1.25 mg/dL (H)). Liver Function Tests: No results for input(s): AST, ALT, ALKPHOS, BILITOT, PROT, ALBUMIN in the last 168 hours. No results for input(s): LIPASE,  AMYLASE in the last 168 hours. No results for input(s): AMMONIA in the last 168 hours. Coagulation Profile: No results for input(s): INR, PROTIME in the last 168 hours. Cardiac Enzymes: Recent Labs  Lab 07/18/18 1714 07/18/18 2324 07/19/18 0549  TROPONINI <0.03 <0.03 <0.03   BNP (last 3 results) Recent Labs    10/20/17 1033  PROBNP 257   HbA1C: No results for input(s): HGBA1C in the last 72 hours. CBG: Recent Labs  Lab 07/18/18 2037 07/19/18 0742 07/19/18 1131  GLUCAP 124* 144* 200*   Lipid Profile: No results for input(s): CHOL, HDL, LDLCALC, TRIG, CHOLHDL, LDLDIRECT in the last 72 hours. Thyroid Function Tests: No results for input(s): TSH, T4TOTAL, FREET4, T3FREE, THYROIDAB in the last 72 hours. Anemia Panel: No results for input(s): VITAMINB12, FOLATE, FERRITIN, TIBC, IRON, RETICCTPCT in the last 72 hours. Sepsis Labs: No results for input(s): PROCALCITON, LATICACIDVEN in the last 168 hours.  No results found for this or any previous visit (from the past 240 hour(s)).       Radiology Studies: Dg Chest 2 View  Result Date: 07/18/2018 CLINICAL DATA:  77 year old female with shortness of breath since yesterday. Chest tightness today. EXAM: CHEST - 2 VIEW COMPARISON:  Chest x-ray 06/30/2018. FINDINGS: Elevated right hemidiaphragm. Lung volumes are normal. No consolidative airspace disease. No pleural effusions. No pneumothorax. No pulmonary nodule or mass noted. Pulmonary vasculature and the cardiomediastinal silhouette are within normal limits. Right-sided pacemaker device with lead tips projecting over the expected location of the right atrium and right ventricle. Old L1 vertebral body compression fracture with 20% loss of anterior vertebral body height. IMPRESSION: 1.  No radiographic evidence of acute cardiopulmonary disease. Electronically Signed   By: Vinnie Langton M.D.   On: 07/18/2018 17:35        Scheduled Meds: . amLODipine  5 mg Oral Daily  .  apixaban  5 mg Oral BID  . aspirin  81 mg Oral Daily  . atorvastatin  40 mg Oral Daily  . DULoxetine  60 mg Oral Daily  . fenofibrate  160 mg Oral QPM  . furosemide  40 mg Oral Daily  . glipiZIDE  20 mg Oral Q breakfast  . irbesartan  300 mg Oral Daily  . isosorbide mononitrate  15 mg Oral Daily  . lactobacillus acidophilus & bulgar  1 tablet Oral TID WC  . metFORMIN  1,000 mg Oral BID WC  . metoprolol succinate  100 mg Oral Daily  . mometasone-formoterol  2 puff Inhalation BID  . oxybutynin  10 mg Oral Daily  . spironolactone  25 mg Oral Daily  . sulfamethoxazole-trimethoprim  1 tablet Oral BID  . triamcinolone cream   Topical BID   Continuous Infusions:   LOS: 0 days    Time spent: 35 minutes.     Hosie Poisson, MD [facility] Pager [phone]   If 7PM-7AM, please contact  night-coverage [email or web address] Password [email or web address] 07/19/2018, 1:45 PM

## 2018-07-20 ENCOUNTER — Encounter (HOSPITAL_COMMUNITY): Payer: Self-pay | Admitting: Cardiology

## 2018-07-20 DIAGNOSIS — I5032 Chronic diastolic (congestive) heart failure: Secondary | ICD-10-CM

## 2018-07-20 DIAGNOSIS — I480 Paroxysmal atrial fibrillation: Secondary | ICD-10-CM | POA: Diagnosis not present

## 2018-07-20 DIAGNOSIS — I25119 Atherosclerotic heart disease of native coronary artery with unspecified angina pectoris: Secondary | ICD-10-CM | POA: Diagnosis not present

## 2018-07-20 DIAGNOSIS — I10 Essential (primary) hypertension: Secondary | ICD-10-CM

## 2018-07-20 DIAGNOSIS — E118 Type 2 diabetes mellitus with unspecified complications: Secondary | ICD-10-CM

## 2018-07-20 DIAGNOSIS — G4733 Obstructive sleep apnea (adult) (pediatric): Secondary | ICD-10-CM

## 2018-07-20 DIAGNOSIS — E782 Mixed hyperlipidemia: Secondary | ICD-10-CM | POA: Diagnosis not present

## 2018-07-20 DIAGNOSIS — I200 Unstable angina: Secondary | ICD-10-CM | POA: Diagnosis not present

## 2018-07-20 LAB — GLUCOSE, CAPILLARY
GLUCOSE-CAPILLARY: 71 mg/dL (ref 70–99)
GLUCOSE-CAPILLARY: 136 mg/dL — AB (ref 70–99)

## 2018-07-20 MED ORDER — ISOSORBIDE MONONITRATE ER 30 MG PO TB24: 30.0000 mg | ORAL_TABLET | Freq: Every day | ORAL | 11 refills | Status: DC

## 2018-07-20 MED ORDER — INFLUENZA VAC SPLIT HIGH-DOSE 0.5 ML IM SUSY
0.5000 mL | PREFILLED_SYRINGE | Freq: Once | INTRAMUSCULAR | Status: AC
  Administered 2018-07-20: 0.5 mL via INTRAMUSCULAR
  Filled 2018-07-20: qty 0.5

## 2018-07-20 MED ORDER — ISOSORBIDE MONONITRATE ER 60 MG PO TB24
30.0000 mg | ORAL_TABLET | Freq: Every day | ORAL | Status: DC
  Administered 2018-07-20: 30 mg via ORAL

## 2018-07-20 MED ORDER — INFLUENZA VAC SPLIT HIGH-DOSE 0.5 ML IM SUSY: 0.5000 mL | PREFILLED_SYRINGE | INTRAMUSCULAR | Status: DC

## 2018-07-20 MED ORDER — AMLODIPINE BESYLATE 10 MG PO TABS: 10.0000 mg | ORAL_TABLET | Freq: Every day | ORAL | 0 refills | Status: DC

## 2018-07-20 NOTE — Consult Note (Addendum)
Cardiology Consult    Patient ID: Brandi Hunter; [id number]; 03-25-1941   Admit date: 07/18/2018 Date of Consult: 07/20/2018  Primary Care Provider: [name], [place] Primary Cardiologist: Has been followed by Dr. Thompson Grayer for EP (Recent cardiac catheterization by Dr. Quay Burow)  Patient Profile    Brandi Hunter is a 77 y.o. 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 Karleen Hampshire.   History of Present Illness    Brandi Hunter was last examined by Lyda Jester, PA-C on 03/30/2018 for hospital follow-up for recent cardiac catheterization and denied any recurrent chest pain or significant dyspnea at that time.  Was continued on her current medication regimen including ASA 81 mg daily, Toprol-XL, Amlodipine, Imdur, Lipitor, and Telmisartan.  In the interim, she was admitted in 06/2018 for sepsis secondary to urinary tract infection.  Creatinine peaked at 1.32 but trended down to 1.00 on the day of discharge.  She presented back to [facility] ED on 07/18/2018 for evaluation of chest discomfort which started around 0200 that morning.  She reports having sinus congestion over the past several weeks and did not experience improvement with antibiotic therapy which have been initiated for recurrent UTI's. This past Sunday, she awoke with discomfort along her sternal region which lasted for several hours. She felt like she could not take a deep breath. She sought care at a local urgent care and was advised to go to the ED for further evaluation. She does report intermittent episodes of chest discomfort since admission which can occur at rest or with walking around her room. Says this has been occurring for  several months and she has trouble differentiating her congestion pain from a cardiac etiology. Symptoms typically resolve within a few minutes. Denies any recent orthopnea, PND, lower extremity edema, or palpitations.  Initial labs show WBC 7.9, Hgb 12.3, platelets 338, Na+ 139, K+ 4.5, and creatinine 1.41.  Initial and cyclic troponin values have been negative.  CXR shows no evidence of acute cardia pulmonary disease.  EKG shows an AV paced rhythm, heart rate 84. Echocardiogram on 9/3 showed a preserved EF of 55-60% with no regional WMA, mild MR, and trivial AI.    Past Medical History:  Diagnosis Date  . Asthma   . Chronic anticoagulation   . COPD (chronic obstructive pulmonary disease) ([place])   . DJD (degenerative joint disease)   . Essential hypertension   . GERD (gastroesophageal reflux disease)   . History of home oxygen therapy   . Hyperlipidemia   . Mobitz (type) II atrioventricular block   . Obesity   . Pacemaker    Implanted by Dr [name] (MDT) 10/06/10  . PAF (paroxysmal atrial fibrillation) ([place])   . Pancreatitis 2010 OR 2011  . Sleep apnea    CPAP  . Type 2 diabetes mellitus ([place])     Past Surgical History:  Procedure Laterality Date  . CATARACT EXTRACTION W/PHACO  11/02/2011   Procedure: CATARACT EXTRACTION PHACO AND INTRAOCULAR LENS PLACEMENT (IOC);  Surgeon: Williams Che;  Location: [facility];  Service: Ophthalmology;  Laterality: Right;  CDE=7.33  . CATARACT EXTRACTION W/PHACO  12/07/2011   Procedure: CATARACT EXTRACTION PHACO AND INTRAOCULAR LENS PLACEMENT (IOC);  Surgeon: Williams Che, MD;  Location: [facility];  Service: Ophthalmology;  Laterality: Left;  CDE 3.61  . COLONOSCOPY WITH PROPOFOL N/A 08/09/2014   Procedure: COLONOSCOPY WITH PROPOFOL;  Surgeon: Juanita Craver, MD;  Location: [facility];  Service: Endoscopy;  Laterality: N/A;  . CYSTOCELE REPAIR    . ESOPHAGOGASTRODUODENOSCOPY (EGD) WITH PROPOFOL N/A 08/09/2014   Procedure: ESOPHAGOGASTRODUODENOSCOPY  (EGD) WITH PROPOFOL;  Surgeon: Juanita Craver, MD;  Location: [facility];  Service: Endoscopy;  Laterality: N/A;  . EYE SURGERY  11/01/2012   BOTH EYES CATARACTS  . INSERT / REPLACE / REMOVE PACEMAKER  10/06/10   MDT  implanted by Dr [name]  . KNEE ARTHROSCOPY     both  . LEFT HEART CATH AND CORONARY ANGIOGRAPHY N/A 03/24/2018   Procedure: LEFT HEART CATH AND CORONARY ANGIOGRAPHY;  Surgeon: Lorretta Harp, MD;  Location: [facility];  Service: Cardiovascular;  Laterality: N/A;  . OVARY SURGERY     removal  . REPAIR RECTOCELE    . SIMPLE MASTECTOMY WITH AXILLARY SENTINEL NODE BIOPSY Left 01/09/2015   Procedure: Irrigation and Drainage Abcess left Axilla;  Surgeon: Jackolyn Confer, MD;  Location: [facility];  Service: General;  Laterality: Left;  . TONSILLECTOMY  AGE 25  . TOTAL ABDOMINAL HYSTERECTOMY  1971     Home Medications:  Prior to Admission medications   Medication Sig Start Date End Date Taking? Authorizing Provider  albuterol (PROAIR HFA) 108 (90 BASE) MCG/ACT inhaler Inhale 2 puffs into the lungs every 4 (four) hours as needed for wheezing.  07/21/12  Yes [provider]  amLODipine (NORVASC) 5 MG tablet Take 1 tablet (5 mg total) by mouth daily. 12/22/17  Yes [name], NP  apixaban (ELIQUIS) 5 MG TABS tablet Take 1 tablet (5 mg total) by mouth 2 (two) times daily. 03/25/18  Yes Isaiah Serge, NP  aspirin 81 MG chewable tablet Chew 81 mg by mouth daily.    Yes [provider]  atorvastatin (LIPITOR) 40 MG tablet Take 40 mg by mouth daily.  03/17/13  Yes [provider]  budesonide-formoterol (SYMBICORT) 160-4.5 MCG/ACT inhaler Inhale 2 puffs into the lungs 2 (two) times daily.   Yes [provider]  DULoxetine (CYMBALTA) 60 MG capsule Take 60 mg by mouth daily.   Yes [provider]  fenofibrate 160 MG tablet Take 160 mg by mouth every evening.   Yes [provider]  furosemide (LASIX) 40 MG tablet Take 1 tablet (40 mg  total) by mouth daily. 12/22/17 12/22/18 Yes [name], NP  glipiZIDE (GLUCOTROL XL) 10 MG 24 hr tablet Take 20 mg by mouth daily with breakfast.  08/20/13  Yes [provider]  ipratropium-albuterol (DUONEB) 0.5-2.5 (3) MG/3ML SOLN Take 3 mLs by nebulization 2 (two) times daily.   Yes [provider]  isosorbide mononitrate (IMDUR) 30 MG 24 hr tablet Take 0.5 tablets (15 mg total) by mouth daily. 03/24/18  Yes Isaiah Serge, NP  lactobacillus acidophilus & bulgar (LACTINEX) chewable tablet Chew 1 tablet by mouth 3 (three) times daily with meals. 07/02/18  Yes Elgergawy, Silver Huguenin, MD  LORazepam (ATIVAN) 0.5 MG tablet Take 0.25-0.5 mg by mouth 2 (two) times daily as needed for anxiety.  04/28/13  Yes [provider]  metFORMIN (GLUCOPHAGE) 1000 MG tablet Take 1 tablet (1,000 mg total) by mouth 2 (two) times daily with a meal. 03/27/18  Yes Isaiah Serge, NP  metoprolol succinate (TOPROL-XL) 100 MG 24 hr tablet  TAKE 1 TABLET BY MOUTH ONCE DAILY Patient taking differently: Take 100 mg by mouth daily.  10/21/17  Yes [name], [name], MD  naproxen (NAPROSYN) 500 MG tablet Take 500 mg by mouth 2 (two) times daily with a meal.   Yes [provider]  oxybutynin (DITROPAN-XL) 10 MG 24 hr tablet Take 10 mg by mouth daily.  07/19/13  Yes [provider]  Probiotic Product (ALIGN PO) Take 1 capsule by mouth daily.    Yes [provider]  spironolactone (ALDACTONE) 25 MG tablet Take 1 tablet (25 mg total) by mouth daily. 11/01/17  Yes [name], [name], MD  sulfamethoxazole-trimethoprim (BACTRIM DS,SEPTRA DS) 800-160 MG tablet Take 1 tablet by mouth 2 (two) times daily. for 7 days Starting 07/15/2018 07/15/18  Yes [provider]  telmisartan (MICARDIS) 80 MG tablet Take 80 mg by mouth daily.  02/25/18  Yes [provider]  traMADol (ULTRAM) 50 MG tablet Take 1 tablet by mouth every 6 (six) hours as needed for pain. 10/24/16  Yes [provider]    triamcinolone ointment (KENALOG) 0.5 % Apply 1 application topically 2 (two) times daily.   Yes [provider]  cefdinir (OMNICEF) 300 MG capsule Take 1 capsule (300 mg total) by mouth every 12 (twelve) hours. Patient not taking: Reported on 07/18/2018 07/02/18   Elgergawy, Silver Huguenin, MD  nitroGLYCERIN (NITROSTAT) 0.4 MG SL tablet Place 1 tablet (0.4 mg total) under the tongue every 5 (five) minutes as needed for chest pain. 05/03/17 06/30/18  [name], NP    Inpatient Medications: Scheduled Meds: . amLODipine  5 mg Oral Daily  . apixaban  5 mg Oral BID  . aspirin  81 mg Oral Daily  . atorvastatin  40 mg Oral Daily  . DULoxetine  60 mg Oral Daily  . fenofibrate  160 mg Oral QPM  . furosemide  40 mg Oral Daily  . irbesartan  300 mg Oral Daily  . isosorbide mononitrate  30 mg Oral Daily  . lactobacillus acidophilus & bulgar  1 tablet Oral TID WC  . metoprolol succinate  100 mg Oral Daily  . mometasone-formoterol  2 puff Inhalation BID  . oxybutynin  10 mg Oral Daily  . spironolactone  25 mg Oral Daily  . sulfamethoxazole-trimethoprim  1 tablet Oral BID  . triamcinolone cream   Topical BID   Continuous Infusions:  PRN Meds: acetaminophen, benzonatate, ipratropium-albuterol, LORazepam, nitroGLYCERIN, ondansetron (ZOFRAN) IV, traMADol  Allergies:    Allergies  Allergen Reactions  . Bee Venom Swelling  . Latex Swelling    LATEX CATHETERS    Social History:   Social History   Socioeconomic History  . Marital status: Widowed    Spouse name: Not on file  . Number of children: 3  . Years of education: College  . Highest education level: Not on file  Occupational History  . Occupation: Part time Retail buyer: [employer]  . Financial resource strain: Not on file  . Food insecurity:    Worry: Not on file    Inability: Not on file  . Transportation needs:    Medical: Not on file    Non-medical: Not on file  Tobacco Use  .  Smoking status: Former Smoker    Packs/day: 0.00    Years: 54.00    Pack years: 0.00    Types: Cigarettes    Last attempt to quit: 09/22/2017    Years since quitting: 0.8  .  Smokeless tobacco: Former Systems developer    Quit date: 11/17/2011  Substance and Sexual Activity  . Alcohol use: Not Currently    Alcohol/week: 1.0 standard drinks    Types: 1 Glasses of wine per week    Comment: couple glasses of wine occasionally-once a month  . Drug use: No  . Sexual activity: Not on file  Lifestyle  . Physical activity:    Days per week: Not on file    Minutes per session: Not on file  . Stress: Not on file  Relationships  . Social connections:    Talks on phone: Not on file    Gets together: Not on file    Attends religious service: Not on file    Active member of club or organization: Not on file    Attends meetings of clubs or organizations: Not on file    Relationship status: Not on file  . Intimate partner violence:    Fear of current or ex partner: Not on file    Emotionally abused: Not on file    Physically abused: Not on file    Forced sexual activity: Not on file  Other Topics Concern  . Not on file  Social History Narrative   Patient lives at home alone.   Caffeine Use: 16oz drink daily     Family History:    Family History  Problem Relation Age of Onset  . Congestive Heart Failure Mother   . Congestive Heart Failure Father   . Osteoarthritis Sister   . Prostate cancer Brother   . Diabetes Brother   . Anesthesia problems Neg Hx   . Hypotension Neg Hx   . Malignant hyperthermia Neg Hx   . Pseudochol deficiency Neg Hx     Review of Systems    General:  No chills, fever, night sweats or weight changes.  Cardiovascular:  No edema, orthopnea, palpitations, paroxysmal nocturnal dyspnea. Positive for chest pain and dyspnea.  Dermatological: No rash, lesions/masses Respiratory: No cough, dyspnea Urologic: No hematuria, dysuria Abdominal:   No nausea, vomiting, diarrhea,  bright red blood per rectum, melena, or hematemesis Neurologic:  No visual changes, wkns, changes in mental status. All other systems reviewed and are otherwise negative except as noted above.  Physical Exam/Data    Vitals:   07/19/18 2041 07/19/18 2137 07/20/18 0557 07/20/18 0757  BP:  (!) 155/65 (!) 148/62   Pulse:  65 65   Resp:      Temp:  98.2 F (36.8 C) 98.2 F (36.8 C)   TempSrc:  Oral Oral   SpO2: 96% 92% 97% 97%  Weight:      Height:        Intake/Output Summary (Last 24 hours) at 07/20/2018 0901 Last data filed at 07/20/2018 0851 Gross per 24 hour  Intake 600 ml  Output -  Net 600 ml   Filed Weights   07/18/18 1605  Weight: 111.1 kg   Body mass index is 38.37 kg/m.   General: Pleasant, Caucasian female appearing in NAD Psych: Normal affect. Neuro: Alert and oriented X 3. Moves all extremities spontaneously. HEENT: Normal  Neck: Supple without bruits or JVD. Lungs:  Resp regular and unlabored, CTA without wheezing or rales. Heart: RRR no s3, s4, or murmurs. Abdomen: Soft, non-tender, non-distended, BS + x 4.  Extremities: No clubbing, cyanosis or edema. DP/PT/Radials 2+ and equal bilaterally.   EKG:  The EKG was personally reviewed and demonstrates: AV paced rhythm, HR 84.  Telemetry:  Telemetry was personally  reviewed and demonstrates:  AV paced, HR in 60's.    Labs/Studies     Relevant CV Studies:  Cardiac Catheterization: 03/2018  Ost 2nd Mrg lesion is 90% stenosed.  The left ventricular systolic function is normal.  LV end diastolic pressure is normal.  The left ventricular ejection fraction is 55-65% by visual estimate.   IMPRESSION: Brandi Hunter has a 90% ostial OM inferior subbranch stenosis.  That is her only obstructive lesion in her coronary tree.  She says she has minimal angina.  She has normal LV function.  At this point, I recommend medical therapy.  Should she have recalcitrant symptoms she would be a candidate for Cut[place] Balloon  atherectomy of the origin of that subbranch.  The radial sheath was removed and a TR band was placed in the right wrist to achieve patent hemostasis.  A femoral angiogram was performed and a minx closure device was deployed successfully achieving hemostasis.  Patient left the lab in stable condition.  She can restart her Eliquis in the next 48 hours.  Echocardiogram: 07/19/2018 Study Conclusions  - Left ventricle: The cavity size was normal. Wall thickness was   normal. Systolic function was normal. The estimated ejection   fraction was in the range of 55% to 60%. Wall motion was normal;   there were no regional wall motion abnormalities. Indeterminate   diastolic function. - Aortic valve: Mildly to moderately calcified annulus. Trileaflet. - Mitral valve: Mildly thickened leaflets. There was mild   regurgitation. - Right ventricle: Pacer wire or catheter noted in right ventricle. - Right atrium: Central venous pressure (est): 3 mm Hg. - Atrial septum: No defect or patent foramen ovale was identified. - Tricuspid valve: There was trivial regurgitation. - Pulmonary arteries: Systolic pressure could not be accurately   estimated. - Pericardium, extracardiac: There was no pericardial effusion.   Laboratory Data:  Chemistry Recent Labs  Lab 07/18/18 1714 07/19/18 0549  NA 139 138  K 4.5 5.0  CL 105 104  CO2 26 28  GLUCOSE 61* 155*  BUN 21 20  CREATININE 1.41* 1.25*  CALCIUM 9.3 8.9  GFRNONAA 35* 41*  GFRAA 41* 48*  ANIONGAP 8 6    No results for input(s): PROT, ALBUMIN, AST, ALT, ALKPHOS, BILITOT in the last 168 hours. Hematology Recent Labs  Lab 07/18/18 1714  WBC 7.9  RBC 4.37  HGB 12.3  HCT 39.2  MCV 89.7  MCH 28.1  MCHC 31.4  RDW 14.2  PLT 338   Cardiac Enzymes Recent Labs  Lab 07/18/18 1714 07/18/18 2324 07/19/18 0549  TROPONINI <0.03 <0.03 <0.03   No results for input(s): TROPIPOC in the last 168 hours.  BNPNo results for input(s): BNP, PROBNP in the  last 168 hours.  DDimer No results for input(s): DDIMER in the last 168 hours.  Radiology/Studies:  Dg Chest 2 View  Result Date: 07/18/2018 CLINICAL DATA:  77 year old female with shortness of breath since yesterday. Chest tightness today. EXAM: CHEST - 2 VIEW COMPARISON:  Chest x-ray 06/30/2018. FINDINGS: Elevated right hemidiaphragm. Lung volumes are normal. No consolidative airspace disease. No pleural effusions. No pneumothorax. No pulmonary nodule or mass noted. Pulmonary vasculature and the cardiomediastinal silhouette are within normal limits. Right-sided pacemaker device with lead tips projecting over the expected location of the right atrium and right ventricle. Old L1 vertebral body compression fracture with 20% loss of anterior vertebral body height. IMPRESSION: 1.  No radiographic evidence of acute cardiopulmonary disease. Electronically Signed   By: Vinnie Langton  M.D.   On: 07/18/2018 17:35     Assessment & Plan    1. Chest Pain with Mixed Typical and Atypical Features  - the patient has a known history of CAD with catheterization in 03/2018 showing 90% ostial OM stenosis and no obstructive disease along the main vessels with medical management recommended at that time. She has experienced episodes of chest pain over the past several days which can last from a few minutes up to hours and is hard for her to differentiate from her previous "cardiac pain" and "pulmonary congestion". - Initial and cyclic troponin values have been negative. EKG shows an AV paced rhythm, heart rate 84. Echocardiogram on 9/3 showed a preserved EF of 55-60% with no regional WMA, mild MR, and trivial AI.  - we spent a significant amount of time reviewing her recent catheterization report as options at this time include further titration of her medication regimen versus a repeat cardiac catheterization. She prefers further titration of her medications at this time, therefore will titrate Imdur from 15mg  daily to  30mg  daily. She has continued on ASA along with Eliquis. On BB, Amlodipine, and statin therapy.   2. HTN - BP has been elevated at 148/62 - 155/65 within the past 24 hours.  - continue Amlodipine, Irbesartan, Imdur, Toprol-XL and Spironolactone. Will titrate Imdur as outlined above. Can further titrate Amlodipine to 10mg  daily if BP remains above goal or for additional anti-anginal benefit.   3. HLD - FLP in 03/2018 showed total cholesterol of 125, HDL 35, and LDL 51. At goal of LDL less than 70. - remains on Atorvastatin 40mg  daily.   4. Paroxysmal Atrial Fibrillation - has been AV Paced on telemetry since admission. She denies any recent palpitations. On Toprol-XL 100mg  daily for rate-control and Eliquis 5mg  BID for anticoagulation.   5. 2nd Degree AV Block - s/p PPM placement in 2011. Most recent interrogation in 05/2018 showed normal device function.  Followed by Dr. [name]  6. COPD - followed by Dr. Halford Chessman in the outpatient setting.    For questions or updates, please contact [place] Please consult [email or web address] for contact info under Cardiology/STEMI.  Signed, Bernerd Pho, PA-C 07/20/2018, 9:01 AM Pager: [phone]   Attending note:  Patient seen and examined.  I reviewed her records and discussed the case with Ms. Ahmed Prima PA-C.  I agree with her findings.  Brandi Hunter has a history of recently documented CAD, only branch vessel with a 90% ostial OM stenosis that was managed medically by Dr. Gwenlyn Found in May, otherwise patent large epicardials.  She also has PAF with history of second-degree heart block and pacemaker in place, followed by Dr. [name].  She is currently admitted with recent symptoms including shortness of breath and sinus congestion, chest tightness, intermittent hypoxia as well.  She was recently admitted for management of UTI.  On examination this morning she appears comfortable.  Systolic blood pressures in the 140s to 150s, heart rate in the 60s with paced  rhythm by telemetry which I personally reviewed.  Lungs exhibit no active wheezing or rhonchi.  Cardiac exam reveals RRR without gallop.  Lab work reveals BUN 20, creatinine 1.25, troponin I negative x3, hemoglobin 12.3, platelets 338.  I personally reviewed her ECG from 07/19/2018 which shows a dual-chamber paced rhythm.  Chest x-ray reports no acute cardiopulmonary process.  Echocardiogram from 07/19/2018 shows LVEF 55 to 60% without regional wall motion abnormalities, mild mitral regurgitation, pacer wire visualized in the right ventricle, no pericardial effusion.  Patient presents with chest discomfort, possibly anginal although multifactorial etiology is also to be considered in light of her chronic lung disease and intermittent hypoxia.  She has ruled out for ACS and LVEF is normal by echocardiogram without focal wall motion abnormalities.  Based on recently documented coronary anatomy, would continue with medical therapy modification.  We are increasing her Imdur to 30 mg in the evening.  Next step would be increasing Norvasc particularly if blood pressure remains elevated.  No further inpatient cardiac testing is planned.  [facility] will sign off.   Medication Recommendations: Continue present regimen with increase in Imdur to 30 mg once in the evening. Other recommendations (labs, testing, etc): No additional cardiac studies at this time. Follow up as an outpatient: We are arranging follow-up in the [place] office with APP and then likely she will establish with Dr. Gwenlyn Found for management of ischemic heart disease.  Satira Sark, M.D., F.A.C.C.

## 2018-07-20 NOTE — Discharge Summary (Addendum)
Physician Discharge Summary  Brandi Hunter JXB:[id number] DOB: Dec 31, 1940 DOA: 07/18/2018  PCP: [name], FNP  Admit date: 07/18/2018 Discharge date: 07/20/2018  Admitted From: home  Disposition:  home  Recommendations for Outpatient Follow-up:  1. Follow up with PCP in 4 weeks 2. Follow up with primary cardiologist Dr. [name] in 1-2 weeks to re-eval SOB and chest pressure with increased dose of Imdur  Home Health:no (pt refused) Equipment/Devices: continue 2L nocturnal home O2 Discharge Condition: stable CODE STATUS: full  Diet recommendation: Heart Healthy / Carb Modified    Brief/Interim Summary: Brandi Hunter is a 77 y.o. 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 She's on ASA, Toprol XL, Norvasc, Imdur, Lipitor and Telmisartan. Initial and cyclic troponin values have been negative.  CXR shows no evidence of acute cardia pulmonary disease.  EKG shows an AV paced rhythm, heart rate 84. Echocardiogram on 9/3 showed a preserved EF of 55-60% with no regional WMA, mild MR, and trivial AI. cardiology is consulted given her high risk and pretest probability.   Relevant CV Studies:  Cardiac Catheterization: 03/2018  Ost 2nd Mrg lesion is 90% stenosed.  The left ventricular systolic function is normal.  LV end diastolic pressure is normal.  The left ventricular ejection fraction is 55-65% by visual estimate.  IMPRESSION:Brandi Hunter has a 90% ostial OM inferior subbranch stenosis. That is her only obstructive lesion in her coronary tree.   Cardiology spent a significant amount of time reviewing her recent catheterization report as options at this time include further titration of her medication regimen versus a repeat cardiac  catheterization. She prefers further titration of her medications at this time, therefore will titrate Imdur from 15mg  daily to 30mg  daily. She has continued on ASA along with Eliquis. On BB, Amlodipine, and statin therapy. BP is not ideally controlled yet and increased her Norvasc from 5 mg to 10 mg. Educated pt about avoiding orthostatic hypotension.   Upon discharge, pt still has some exertional SOB but felt improved and comfortable to go home. She lives alone and her son will help picking her up. Her lungs are clear and dry. Her heart sound normal.   Pt was recently diagnosed of UTI and started on Bactrim. She does not have any further dysuria symptoms and has been on Bactrim for 6 days. No need to continue at home given this medication can worse hyperkalemia and pt's potassium level is already 5 and is on ARBs.    Discharge Diagnoses:  Principal Problem:   Chest pain Active Problems:   Essential hypertension   OSA (obstructive sleep apnea)   Paroxysmal atrial fibrillation (HCC)   Type 2 diabetes mellitus (HCC)   Chronic diastolic heart failure (HCC)   Esophageal reflux   Mixed hyperlipidemia    Discharge Instructions  Discharge Instructions    Call MD for:   Complete by:  As directed    Worsening shortness of breath or chest pain   Diet - low sodium heart healthy   Complete by:  As directed    Discharge instructions   Complete by:  As directed    Follow up with your cardiologist and primary care per appointment dates Be aware that you are taking higher dose of amlodipine and Imdur; when  sitting up or standing up, allow enough time to adjust body posture as these heart and blood pressure medications may cause orthostatic hypotension   Increase activity slowly   Complete by:  As directed      Allergies as of 07/20/2018      Reactions   Bee Venom Swelling   Latex Swelling   LATEX CATHETERS      Medication List    STOP taking these medications   cefdinir 300 MG  capsule Commonly known as:  OMNICEF   naproxen 500 MG tablet Commonly known as:  NAPROSYN   sulfamethoxazole-trimethoprim 800-160 MG tablet Commonly known as:  BACTRIM DS,SEPTRA DS     TAKE these medications   ALIGN PO Take 1 capsule by mouth daily.   amLODipine 10 MG tablet Commonly known as:  NORVASC Take 1 tablet (10 mg total) by mouth daily. What changed:    medication strength  how much to take   apixaban 5 MG Tabs tablet Commonly known as:  ELIQUIS Take 1 tablet (5 mg total) by mouth 2 (two) times daily.   aspirin 81 MG chewable tablet Chew 81 mg by mouth daily.   atorvastatin 40 MG tablet Commonly known as:  LIPITOR Take 40 mg by mouth daily.   budesonide-formoterol 160-4.5 MCG/ACT inhaler Commonly known as:  SYMBICORT Inhale 2 puffs into the lungs 2 (two) times daily.   DULoxetine 60 MG capsule Commonly known as:  CYMBALTA Take 60 mg by mouth daily.   fenofibrate 160 MG tablet Take 160 mg by mouth every evening.   furosemide 40 MG tablet Commonly known as:  LASIX Take 1 tablet (40 mg total) by mouth daily.   glipiZIDE 10 MG 24 hr tablet Commonly known as:  GLUCOTROL XL Take 20 mg by mouth daily with breakfast.   ipratropium-albuterol 0.5-2.5 (3) MG/3ML Soln Commonly known as:  DUONEB Take 3 mLs by nebulization 2 (two) times daily.   isosorbide mononitrate 30 MG 24 hr tablet Commonly known as:  IMDUR Take 1 tablet (30 mg total) by mouth daily. What changed:  how much to take   lactobacillus acidophilus & bulgar chewable tablet Chew 1 tablet by mouth 3 (three) times daily with meals.   LORazepam 0.5 MG tablet Commonly known as:  ATIVAN Take 0.25-0.5 mg by mouth 2 (two) times daily as needed for anxiety.   metFORMIN 1000 MG tablet Commonly known as:  GLUCOPHAGE Take 1 tablet (1,000 mg total) by mouth 2 (two) times daily with a meal.   metoprolol succinate 100 MG 24 hr tablet Commonly known as:  TOPROL-XL TAKE 1 TABLET BY MOUTH ONCE  DAILY   nitroGLYCERIN 0.4 MG SL tablet Commonly known as:  NITROSTAT Place 1 tablet (0.4 mg total) under the tongue every 5 (five) minutes as needed for chest pain.   oxybutynin 10 MG 24 hr tablet Commonly known as:  DITROPAN-XL Take 10 mg by mouth daily.   PROAIR HFA 108 (90 Base) MCG/ACT inhaler Generic drug:  albuterol Inhale 2 puffs into the lungs every 4 (four) hours as needed for wheezing.   spironolactone 25 MG tablet Commonly known as:  ALDACTONE Take 1 tablet (25 mg total) by mouth daily.   telmisartan 80 MG tablet Commonly known as:  MICARDIS Take 80 mg by mouth daily.   traMADol 50 MG tablet Commonly known as:  ULTRAM Take 1 tablet by mouth every 6 (six) hours as needed for pain.   triamcinolone ointment 0.5 % Commonly known as:  KENALOG Apply  1 application topically 2 (two) times daily.      Follow-up Information    Consuelo Pandy, PA-C Follow up on 08/03/2018.   Specialties:  Cardiology, Radiology Why:  Cardiology Hospital Follow-Up on 08/03/2018 at 11:00 with Lyda Jester, PA-C (works with Dr. [name]).  Contact information: [address] [place] [address] [phone]        [name], Luis M. Cintron. Schedule an appointment as soon as possible for a visit in 4 week(s).   Specialty:  Nurse Practitioner Contact information: [place] [address] [phone]        Thompson Grayer, MD .   Specialty:  Cardiology Contact information: [address] [place] [address] [phone]          Allergies  Allergen Reactions  . Bee Venom Swelling  . Latex Swelling    LATEX CATHETERS    Consultations:  Dr. [name] -- Cardiology   Procedures/Studies: Dg Chest 2 View  Result Date: 07/18/2018 CLINICAL DATA:  77 year old female with shortness of breath since yesterday. Chest tightness today. EXAM: CHEST - 2 VIEW COMPARISON:  Chest x-ray 06/30/2018. FINDINGS: Elevated right  hemidiaphragm. Lung volumes are normal. No consolidative airspace disease. No pleural effusions. No pneumothorax. No pulmonary nodule or mass noted. Pulmonary vasculature and the cardiomediastinal silhouette are within normal limits. Right-sided pacemaker device with lead tips projecting over the expected location of the right atrium and right ventricle. Old L1 vertebral body compression fracture with 20% loss of anterior vertebral body height. IMPRESSION: 1.  No radiographic evidence of acute cardiopulmonary disease. Electronically Signed   By: Vinnie Langton M.D.   On: 07/18/2018 17:35   Dg Chest Port 1 View  Result Date: 06/30/2018 CLINICAL DATA:  Lightheadedness. EXAM: PORTABLE CHEST 1 VIEW COMPARISON:  Mar 23, 2018 FINDINGS: Stable right pacemaker. Stable cardiomegaly. Stable elevation the right hemidiaphragm. The hila and mediastinum are unchanged. No pulmonary nodules, masses, or focal infiltrates. IMPRESSION: No active disease. Electronically Signed   By: Dorise Bullion III M.D   On: 06/30/2018 12:54   (Echo, Carotid, EGD, Colonoscopy, ERCP)    Subjective:   Discharge Exam: Vitals:   07/20/18 0557 07/20/18 0757  BP: (!) 148/62   Pulse: 65   Resp:    Temp: 98.2 F (36.8 C)   SpO2: 97% 97%   Vitals:   07/19/18 2041 07/19/18 2137 07/20/18 0557 07/20/18 0757  BP:  (!) 155/65 (!) 148/62   Pulse:  65 65   Resp:      Temp:  98.2 F (36.8 C) 98.2 F (36.8 C)   TempSrc:  Oral Oral   SpO2: 96% 92% 97% 97%  Weight:      Height:        General: Pt is alert, awake, not in acute distress Cardiovascular: RRR, S1/S2 +, no rubs, no gallops Respiratory: CTA bilaterally, no wheezing, no rhonchi Abdominal: Soft, NT, ND, bowel sounds + Extremities: no edema, no cyanosis    The results of significant diagnostics from this hospitalization (including imaging, microbiology, ancillary and laboratory) are listed below for reference.     Microbiology: No results found for this or any  previous visit (from the past 240 hour(s)).   Labs: BNP (last 3 results) Recent Labs    03/23/18 1254  BNP 299.2*   Basic Metabolic Panel: Recent Labs  Lab 07/18/18 1714 07/19/18 0549  NA 139 138  K 4.5 5.0  CL 105 104  CO2 26  28  GLUCOSE 61* 155*  BUN 21 20  CREATININE 1.41* 1.25*  CALCIUM 9.3 8.9   Liver Function Tests: No results for input(s): AST, ALT, ALKPHOS, BILITOT, PROT, ALBUMIN in the last 168 hours. No results for input(s): LIPASE, AMYLASE in the last 168 hours. No results for input(s): AMMONIA in the last 168 hours. CBC: Recent Labs  Lab 07/18/18 1714  WBC 7.9  NEUTROABS 4.3  HGB 12.3  HCT 39.2  MCV 89.7  PLT 338   Cardiac Enzymes: Recent Labs  Lab 07/18/18 1714 07/18/18 2324 07/19/18 0549  TROPONINI <0.03 <0.03 <0.03   BNP: Invalid input(s): POCBNP CBG: Recent Labs  Lab 07/19/18 1131 07/19/18 1645 07/19/18 2159 07/20/18 0737 07/20/18 1205  GLUCAP 200* 95 95 71 136*   D-Dimer No results for input(s): DDIMER in the last 72 hours. Hgb A1c No results for input(s): HGBA1C in the last 72 hours. Lipid Profile No results for input(s): CHOL, HDL, LDLCALC, TRIG, CHOLHDL, LDLDIRECT in the last 72 hours. Thyroid function studies No results for input(s): TSH, T4TOTAL, T3FREE, THYROIDAB in the last 72 hours.  Invalid input(s): FREET3 Anemia work up No results for input(s): VITAMINB12, FOLATE, FERRITIN, TIBC, IRON, RETICCTPCT in the last 72 hours. Urinalysis    Component Value Date/Time   COLORURINE YELLOW 07/18/2018 2014   APPEARANCEUR CLOUDY (A) 07/18/2018 2014   LABSPEC 1.015 07/18/2018 2014   PHURINE 5.0 07/18/2018 2014   GLUCOSEU NEGATIVE 07/18/2018 2014   HGBUR NEGATIVE 07/18/2018 2014   [place] NEGATIVE 07/18/2018 2014   [place] 07/18/2018 2014   PROTEINUR NEGATIVE 07/18/2018 2014   UROBILINOGEN 1.0 05/11/2011 2020   NITRITE NEGATIVE 07/18/2018 2014   LEUKOCYTESUR TRACE (A) 07/18/2018 2014   Sepsis Labs Invalid  input(s): PROCALCITONIN,  WBC,  LACTICIDVEN Microbiology No results found for this or any previous visit (from the past 240 hour(s)).   Time coordinating discharge: Over 30 minutes (35 min)  SIGNED:   Paticia Stack, MD  [facility] 07/20/2018, 1:20 PM Pager   If 7PM-7AM, please contact night-coverage [email or web address] Password [email or web address]

## 2018-07-20 NOTE — Progress Notes (Signed)
IV removed, 2x2 gauze and paper tape applied to site, patient tolerated well.  Reviewed AVS with patient who verbalized understanding.  Patient awaiting son's arrival to transport home.

## 2018-08-03 ENCOUNTER — Ambulatory Visit: Payer: Medicare HMO | Admitting: Cardiology

## 2018-08-12 ENCOUNTER — Ambulatory Visit: Payer: Medicare HMO | Admitting: Internal Medicine

## 2018-08-12 ENCOUNTER — Encounter: Payer: Self-pay | Admitting: Internal Medicine

## 2018-08-12 VITALS — BP 88/52 | HR 71 | Ht 66.0 in | Wt 233.4 lb

## 2018-08-12 DIAGNOSIS — I441 Atrioventricular block, second degree: Secondary | ICD-10-CM | POA: Diagnosis not present

## 2018-08-12 DIAGNOSIS — I10 Essential (primary) hypertension: Secondary | ICD-10-CM

## 2018-08-12 DIAGNOSIS — I480 Paroxysmal atrial fibrillation: Secondary | ICD-10-CM | POA: Diagnosis not present

## 2018-08-12 DIAGNOSIS — Z950 Presence of cardiac pacemaker: Secondary | ICD-10-CM | POA: Diagnosis not present

## 2018-08-12 DIAGNOSIS — I5032 Chronic diastolic (congestive) heart failure: Secondary | ICD-10-CM

## 2018-08-12 LAB — BASIC METABOLIC PANEL
BUN / CREAT RATIO: 20 (ref 12–28)
BUN: 32 mg/dL — ABNORMAL HIGH (ref 8–27)
CO2: 23 mmol/L (ref 20–29)
CREATININE: 1.62 mg/dL — AB (ref 0.57–1.00)
Calcium: 9.6 mg/dL (ref 8.7–10.3)
Chloride: 99 mmol/L (ref 96–106)
GFR calc Af Amer: 36 mL/min/{1.73_m2} — ABNORMAL LOW (ref >59)
GFR calc non Af Amer: 31 mL/min/{1.73_m2} — ABNORMAL LOW (ref >59)
GLUCOSE: 134 mg/dL — AB (ref 65–99)
POTASSIUM: 3.8 mmol/L (ref 3.5–5.2)
SODIUM: 141 mmol/L (ref 134–144)

## 2018-08-12 LAB — CBC WITH DIFFERENTIAL/PLATELET
BASOS ABS: 0 10*3/uL (ref 0.0–0.2)
Basos: 0 %
EOS (ABSOLUTE): 0.7 10*3/uL — AB (ref 0.0–0.4)
Eos: 8 %
HEMOGLOBIN: 11.1 g/dL (ref 11.1–15.9)
Hematocrit: 33.5 % — ABNORMAL LOW (ref 34.0–46.6)
Immature Grans (Abs): 0 10*3/uL (ref 0.0–0.1)
Immature Granulocytes: 0 %
LYMPHS ABS: 1.8 10*3/uL (ref 0.7–3.1)
Lymphs: 21 %
MCH: 29 pg (ref 26.6–33.0)
MCHC: 33.1 g/dL (ref 31.5–35.7)
MCV: 88 fL (ref 79–97)
MONOS ABS: 0.7 10*3/uL (ref 0.1–0.9)
Monocytes: 9 %
Neutrophils Absolute: 5.1 10*3/uL (ref 1.4–7.0)
Neutrophils: 62 %
PLATELETS: 309 10*3/uL (ref 150–450)
RBC: 3.83 x10E6/uL (ref 3.77–5.28)
RDW: 14.5 % (ref 12.3–15.4)
WBC: 8.3 10*3/uL (ref 3.4–10.8)

## 2018-08-12 MED ORDER — TELMISARTAN 40 MG PO TABS: 40.0000 mg | ORAL_TABLET | Freq: Every day | ORAL | 3 refills | Status: DC

## 2018-08-12 NOTE — Patient Instructions (Addendum)
Medication Instructions:  Your physician has recommended you make the following change in your medication:  1.  Reduce your telmisartan (Micardis) 80 mg tablets-  Take 1/2 tablet by mouth daily.  When you run out of 80 mg tabs- your next prescription will be for telmisartan 40 mg  Labwork: You will get lab work today:  BMP and CBC  Testing/Procedures: None ordered.  Follow-Up:  Your physician wants you to follow-up in: 1-2 weeks with [place] in [place].   Your physician wants you to follow-up in: one year with Dr. [name].  You will receive a reminder letter in the mail two months in advance. If you don't receive a letter, please call our office to schedule the follow-up appointment.  Remote monitoring is used to monitor your Pacemaker from home. This monitoring reduces the number of office visits required to check your device to one time per year. It allows [place] to keep an eye on the functioning of your device to ensure it is working properly. You are scheduled for a device check from home on 08/30/2018. You may send your transmission at any time that day. If you have a wireless device, the transmission will be sent automatically. After your physician reviews your transmission, you will receive a postcard with your next transmission date.  Any Other Special Instructions Will Be Listed Below (If Applicable).  If you need a refill on your cardiac medications before your next appointment, please call your pharmacy.

## 2018-08-12 NOTE — Progress Notes (Signed)
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 female who presents today for routine electrophysiology followup. She had cath by Dr Gwenlyn Found 03/24/18 which revealed single vessel CAD.  Medical  Management was advised. Since last being seen in our clinic, the patient reports doing reasonably well.  She was hospitalized earlier this month for chest pain at [facility].  She was evaluated by Dr Domenic Polite. Medicine titration was performed. She continues to have "chest tightness" as well as SOB with activity.  With recent medicine changes, her BP has been lower and she has had frequent dizziness. Today, she denies symptoms of palpitations,   lower extremity edema,  presyncope, or syncope.  The patient is otherwise without complaint today.   Past Medical History:  Diagnosis Date  . Asthma   . Chronic anticoagulation   . COPD (chronic obstructive pulmonary disease) ([place])   . DJD (degenerative joint disease)   . Essential hypertension   . GERD (gastroesophageal reflux disease)   . History of home oxygen therapy   . Hyperlipidemia   . [place] (type) II atrioventricular block   . Obesity   . Pacemaker    Implanted by Dr [name] (MDT) 10/06/10  . PAF (paroxysmal atrial fibrillation) ([place])   . Pancreatitis 2010 OR 2011  . Sleep apnea    CPAP  . Type 2 diabetes mellitus ([place])    Past Surgical History:  Procedure Laterality Date  . CATARACT EXTRACTION W/PHACO  11/02/2011   Procedure: CATARACT EXTRACTION PHACO AND INTRAOCULAR LENS PLACEMENT (IOC);  Surgeon: Williams Che;  Location: [facility];  Service: Ophthalmology;  Laterality: Right;  CDE=7.33  . CATARACT EXTRACTION W/PHACO  12/07/2011   Procedure: CATARACT EXTRACTION PHACO AND INTRAOCULAR LENS PLACEMENT (IOC);  Surgeon: Williams Che, MD;  Location: [facility];  Service: Ophthalmology;  Laterality: Left;  CDE 3.61  . COLONOSCOPY WITH PROPOFOL N/A 08/09/2014   Procedure: COLONOSCOPY WITH PROPOFOL;  Surgeon: Juanita Craver, MD;  Location: [facility];   Service: Endoscopy;  Laterality: N/A;  . CYSTOCELE REPAIR    . ESOPHAGOGASTRODUODENOSCOPY (EGD) WITH PROPOFOL N/A 08/09/2014   Procedure: ESOPHAGOGASTRODUODENOSCOPY (EGD) WITH PROPOFOL;  Surgeon: Juanita Craver, MD;  Location: [facility];  Service: Endoscopy;  Laterality: N/A;  . EYE SURGERY  11/01/2012   BOTH EYES CATARACTS  . INSERT / REPLACE / REMOVE PACEMAKER  10/06/10   MDT  implanted by Dr [name]  . KNEE ARTHROSCOPY     both  . LEFT HEART CATH AND CORONARY ANGIOGRAPHY N/A 03/24/2018   Procedure: LEFT HEART CATH AND CORONARY ANGIOGRAPHY;  Surgeon: Lorretta Harp, MD;  Location: [place] [facility];  Service: Cardiovascular;  Laterality: N/A;  . OVARY SURGERY     removal  . REPAIR RECTOCELE    . SIMPLE MASTECTOMY WITH AXILLARY SENTINEL NODE BIOPSY Left 01/09/2015   Procedure: Irrigation and Drainage Abcess left Axilla;  Surgeon: Jackolyn Confer, MD;  Location: [facility];  Service: General;  Laterality: Left;  . TONSILLECTOMY  AGE 34  . TOTAL ABDOMINAL HYSTERECTOMY  1971    ROS- all systems are reviewed and negative except as per HPI above  Current Outpatient Medications  Medication Sig Dispense Refill  . albuterol (PROAIR HFA) 108 (90 BASE) MCG/ACT inhaler Inhale 2 puffs into the lungs every 4 (four) hours as needed for wheezing.     Marland Kitchen amLODipine (NORVASC) 10 MG tablet Take 1 tablet (10 mg total) by mouth daily. 30 tablet 0  . apixaban (ELIQUIS) 5 MG TABS tablet  Take 1 tablet (5 mg total) by mouth 2 (two) times daily. 180 tablet 3  . aspirin 81 MG chewable tablet Chew 81 mg by mouth daily.     Marland Kitchen atorvastatin (LIPITOR) 40 MG tablet Take 40 mg by mouth daily.     . budesonide-formoterol (SYMBICORT) 160-4.5 MCG/ACT inhaler Inhale 2 puffs into the lungs 2 (two) times daily.    . DULoxetine (CYMBALTA) 60 MG capsule Take 60 mg by mouth daily.    . fenofibrate 160 MG tablet Take 160 mg by mouth every evening.    . furosemide (LASIX) 40 MG tablet Take 1 tablet (40 mg total) by mouth daily. 90  tablet 3  . glipiZIDE (GLUCOTROL XL) 10 MG 24 hr tablet Take 20 mg by mouth daily with breakfast.     . ipratropium-albuterol (DUONEB) 0.5-2.5 (3) MG/3ML SOLN Take 3 mLs by nebulization 2 (two) times daily.    . isosorbide mononitrate (IMDUR) 30 MG 24 hr tablet Take 1 tablet (30 mg total) by mouth daily. 30 tablet 11  . lactobacillus acidophilus & bulgar (LACTINEX) chewable tablet Chew 1 tablet by mouth 3 (three) times daily with meals. 45 tablet 0  . LORazepam (ATIVAN) 0.5 MG tablet Take 0.25-0.5 mg by mouth 2 (two) times daily as needed for anxiety.     . metFORMIN (GLUCOPHAGE) 1000 MG tablet Take 1 tablet (1,000 mg total) by mouth 2 (two) times daily with a meal.    . metoprolol succinate (TOPROL-XL) 100 MG 24 hr tablet TAKE 1 TABLET BY MOUTH ONCE DAILY (Patient taking differently: Take 100 mg by mouth daily. ) 90 tablet 3  . oxybutynin (DITROPAN-XL) 10 MG 24 hr tablet Take 10 mg by mouth daily.     . Probiotic Product (ALIGN PO) Take 1 capsule by mouth daily.     Marland Kitchen spironolactone (ALDACTONE) 25 MG tablet Take 1 tablet (25 mg total) by mouth daily. 90 tablet 3  . telmisartan (MICARDIS) 80 MG tablet Take 80 mg by mouth daily.   5  . traMADol (ULTRAM) 50 MG tablet Take 1 tablet by mouth every 6 (six) hours as needed for pain.    Marland Kitchen triamcinolone ointment (KENALOG) 0.5 % Apply 1 application topically 2 (two) times daily.    . nitroGLYCERIN (NITROSTAT) 0.4 MG SL tablet Place 1 tablet (0.4 mg total) under the tongue every 5 (five) minutes as needed for chest pain. 25 tablet 3   No current facility-administered medications for this visit.     Physical Exam: Vitals:   08/12/18 1026  BP: (!) 88/52  Pulse: 71  SpO2: 95%  Weight: 233 lb 6.4 oz (105.9 kg)  Height: 5\' 6"  (1.676 m)    GEN- The patient is well appearing, alert and oriented x 3 today.  Pale today Head- normocephalic, atraumatic Eyes-  Sclera clear, conjunctiva pink Ears- hearing intact Oropharynx- clear Lungs- Clear to  ausculation bilaterally, normal work of breathing Chest- pacemaker pocket is well healed Heart- Regular rate and rhythm (paced) GI- soft, NT, ND, + BS Extremities- no clubbing, cyanosis, or edema  Pacemaker interrogation- reviewed in detail today,  See PACEART report  ekg tracing ordered today is personally reviewed and shows afib, V paced  Assessment and Plan:  1. Symptomatic second degree heart block Normal pacemaker function See Pace Art report No changes today I have adjusted her rate response today to try to improve SOB  2. afib afib burden is 0.1 % (2.6% last year) Though she is in afib today, her overall  afib burden is very low. Continue eliquis  3. CAD Recently diagnosed Improved with changes by Dr Domenic Polite Refer to general cardiology for long term management.  She does not wish to follow-up with Dr Gwenlyn Found at [facility].  She lives in [place] and is very clear that she would prefer to establish in [place] office.  As she has seen Dr Domenic Polite and Bernerd Pho there recently, I will refer back to them for general cardiology care.  4. HTN Low bp Reduce micardis to 40mg  daily May have to stop norvasc if bp remains low Cbc bmet today  5. Overweight Body mass index is 37.67 kg/m. Lifestyle modification  6. Tobacco She quit smoking 09/2017!  7. Chronic diastolic dysfunction euvolemic on exam today.  I am not convinced that this is the cause for her SOB. May benefit from PFTs and CPX to further evaluate her chest tightness and SOB.  I will defer to general cardiology   [facility] Return to see EP PA in a year Schedule follow-up with Dr Domenic Polite as above  Thompson Grayer MD, [facility] 08/12/2018 10:39 AM

## 2018-08-15 LAB — CUP PACEART INCLINIC DEVICE CHECK
Battery Remaining Longevity: 60 mo
Brady Statistic AS VS Percent: 5 %
Implantable Lead Implant Date: 20111121
Implantable Lead Location: 753860
Implantable Lead Model: 4470
Implantable Lead Serial Number: 687643
Lead Channel Impedance Value: 490 Ohm
Lead Channel Pacing Threshold Amplitude: 0.75 V
Lead Channel Pacing Threshold Pulse Width: 0.4 ms
Lead Channel Setting Pacing Amplitude: 2 V
MDC IDC LEAD IMPLANT DT: 20111121
MDC IDC LEAD LOCATION: 753859
MDC IDC LEAD SERIAL: 548226
MDC IDC MSMT BATTERY IMPEDANCE: 800 Ohm
MDC IDC MSMT BATTERY VOLTAGE: 2.76 V
MDC IDC MSMT LEADCHNL RA IMPEDANCE VALUE: 414 Ohm
MDC IDC PG IMPLANT DT: 20111121
MDC IDC SESS DTM: 20190927135504
MDC IDC SET LEADCHNL RV PACING AMPLITUDE: 2.5 V
MDC IDC SET LEADCHNL RV PACING PULSEWIDTH: 0.4 ms
MDC IDC SET LEADCHNL RV SENSING SENSITIVITY: 5.6 mV
MDC IDC STAT BRADY AP VP PERCENT: 84 %
MDC IDC STAT BRADY AP VS PERCENT: 0 %
MDC IDC STAT BRADY AS VP PERCENT: 12 %

## 2018-08-15 NOTE — Progress Notes (Signed)
[facility] [employer] PREP Weekly Session   Patient Details  Name: Brandi Hunter MRN: [id number] Date of Birth: 1941-10-30 Age: 77 y.o. PCP: [name], FNP  Vitals:   08/10/18 1446  Weight: 236 lb 12.8 oz (107.4 kg)    [facility] [employer] Weekly seesion - 08/15/18 1400      Weekly Session   Minutes exercised this week  120 minutes   60cardio/52flexibility     Fun things you did since last class:"Laughing at self" Things you are grateful for:"friends/family" Nutrition celebrations:"eating meals at home at regular times" Barriers:"have to see GI doctor (blood in stool)"  [name] 08/15/2018, 2:47 PM

## 2018-08-24 ENCOUNTER — Telehealth: Payer: Self-pay

## 2018-08-24 DIAGNOSIS — N289 Disorder of kidney and ureter, unspecified: Secondary | ICD-10-CM

## 2018-08-24 NOTE — Telephone Encounter (Signed)
-----   Message from Thompson Grayer, MD sent at 08/21/2018  8:40 PM EDT ----- Results reviewed.  Sonia Baller, please inform pt of result.   Elevation in creatinine is above baseline.  Repeat bmet in 2 weeks and encourage follow-up with PCP.

## 2018-08-24 NOTE — Telephone Encounter (Signed)
Notes recorded by Frederik Schmidt, RN on 08/24/2018 at 9:56 AM EDT The patient has been notified of the result and verbalized understanding. All questions (if any) were answered. Frederik Schmidt, RN 08/24/2018 9:56 AM   ------

## 2018-08-29 NOTE — Progress Notes (Signed)
[facility] [employer] PREP Weekly Session   Patient Details  Name: Brandi Hunter MRN: [id number] Date of Birth: [birth year] Age: 77 y.o. PCP: [name], FNP  Vitals:   08/29/18 0950  Weight: 236 lb 8 oz (107.3 kg)    [facility] [employer] Weekly seesion - 08/29/18 0900      Weekly Session   Topic Discussed  Other    Minutes exercised this week  60 minutes   30cardio/89flexibility     Things you are grateful for:"being in class on Wed AM for a change" Nutrition celebrations:"staying away from "red foods" Barriers:"hydration"  [name] 08/29/2018, 9:50 AM

## 2018-08-30 ENCOUNTER — Ambulatory Visit (INDEPENDENT_AMBULATORY_CARE_PROVIDER_SITE_OTHER): Payer: Medicare HMO | Admitting: *Deleted

## 2018-08-30 ENCOUNTER — Telehealth: Payer: Self-pay | Admitting: Cardiology

## 2018-08-30 DIAGNOSIS — I441 Atrioventricular block, second degree: Secondary | ICD-10-CM | POA: Diagnosis not present

## 2018-08-30 NOTE — Telephone Encounter (Signed)
Spoke with pt and reminded pt of remote transmission that is due today. Pt verbalized understanding.   

## 2018-08-31 NOTE — Progress Notes (Signed)
Remote pacemaker transmission.   

## 2018-09-02 ENCOUNTER — Ambulatory Visit: Payer: Medicare HMO | Admitting: Cardiology

## 2018-09-02 ENCOUNTER — Encounter

## 2018-09-07 ENCOUNTER — Other Ambulatory Visit: Payer: Medicare HMO | Admitting: *Deleted

## 2018-09-07 DIAGNOSIS — N289 Disorder of kidney and ureter, unspecified: Secondary | ICD-10-CM

## 2018-09-08 LAB — BASIC METABOLIC PANEL
BUN/Creatinine Ratio: 20 (ref 12–28)
BUN: 23 mg/dL (ref 8–27)
CALCIUM: 9.4 mg/dL (ref 8.7–10.3)
CHLORIDE: 100 mmol/L (ref 96–106)
CO2: 30 mmol/L — AB (ref 20–29)
Creatinine, Ser: 1.13 mg/dL — ABNORMAL HIGH (ref 0.57–1.00)
GFR calc Af Amer: 55 mL/min/{1.73_m2} — ABNORMAL LOW (ref >59)
GFR calc non Af Amer: 48 mL/min/{1.73_m2} — ABNORMAL LOW (ref >59)
Glucose: 96 mg/dL (ref 65–99)
POTASSIUM: 4.2 mmol/L (ref 3.5–5.2)
Sodium: 146 mmol/L — ABNORMAL HIGH (ref 134–144)

## 2018-09-09 ENCOUNTER — Ambulatory Visit: Payer: Medicare HMO | Admitting: Student

## 2018-09-09 ENCOUNTER — Encounter: Payer: Self-pay | Admitting: Student

## 2018-09-09 VITALS — BP 120/66 | HR 87 | Ht 67.0 in | Wt 242.0 lb

## 2018-09-09 DIAGNOSIS — I10 Essential (primary) hypertension: Secondary | ICD-10-CM

## 2018-09-09 DIAGNOSIS — I441 Atrioventricular block, second degree: Secondary | ICD-10-CM | POA: Diagnosis not present

## 2018-09-09 DIAGNOSIS — I2510 Atherosclerotic heart disease of native coronary artery without angina pectoris: Secondary | ICD-10-CM

## 2018-09-09 DIAGNOSIS — J449 Chronic obstructive pulmonary disease, unspecified: Secondary | ICD-10-CM

## 2018-09-09 DIAGNOSIS — R42 Dizziness and giddiness: Secondary | ICD-10-CM

## 2018-09-09 DIAGNOSIS — I480 Paroxysmal atrial fibrillation: Secondary | ICD-10-CM

## 2018-09-09 DIAGNOSIS — I5032 Chronic diastolic (congestive) heart failure: Secondary | ICD-10-CM | POA: Diagnosis not present

## 2018-09-09 MED ORDER — AMLODIPINE BESYLATE 10 MG PO TABS: 5.0000 mg | ORAL_TABLET | Freq: Every day | ORAL | 0 refills | Status: DC

## 2018-09-09 MED ORDER — FUROSEMIDE 40 MG PO TABS: 20.0000 mg | ORAL_TABLET | Freq: Every day | ORAL | 3 refills | Status: DC

## 2018-09-09 NOTE — Progress Notes (Signed)
Cardiology Office Note    Date:  09/09/2018   ID:  Brandi Hunter, DOB [birth year], MRN [id number]  PCP:  [name], [place]  Cardiologist: Thompson Grayer, MD  --> plans to establish with Dr. Domenic Polite as her General Cardiologist and Dr. [name] will be specifically for EP.   Chief Complaint  Patient presents with  . Follow-up    1 month visit    History of Present Illness:    Brandi Hunter is a 77 y.o. 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  Was recently admitted to [facility] in 07/2018 for evaluation of chest discomfort. She described this as pain along her sternal region which have been persistent for several hours and she felt as if she could not take a deep breath. Cyclic troponin values remained negative and her EKG showed no acute ischemic changes. An echocardiogram was obtained and showed a preserved EF of 55 to 60% with no regional wall motion abnormalities. Continued medical therapy was recommended and Imdur was titrated from 15 mg daily to 30 mg daily.  Was evaluated by Dr. [name] on 08/12/2018 and reported frequent episodes of chest discomfort. She was hypotensive at 88/52 during her visit and Micardis was reduced to 40mg  daily.   In talking with the patient today, she reports having frequent episodes of dizziness and "spots in her vision" following hospital discharge in 07/2018. She did have some hypotension as noted at her office visit but has also been struggling with episodes of hypoglycemia with blood sugar in the 30's to 50's at various times. She tells me that her last episode of vision changes was approximately 4 weeks ago when she was driving and she "had dark spots across her  entire visual field" and had to pull over. She was unable to check her glucose at that time but symptoms did improve within several minutes. Denies any associated chest pain, palpitations, dyspnea, headaches or syncope during the event. She has been informed by her PCP not to drive in the interim.  She reports that her overall dizziness has improved with the dose reduction of Micardis and Amlodipine. SBP has been in the 140's to 150's in the AM hours prior to taking her medications and she does not typically check BP later in the day. BP is well-controlled at 120/66 today. Labs were obtained at the time of her last office visit with Dr. [name] and showed that creatinine was acutely elevated to 1.62 and this improved to 1.13 when checked most recently on 09/07/2018.  She denies any specific chest discomfort similar to what brought her to the hospital in 07/2018. Reports having chronic dyspnea on exertion in the setting of COPD but denies any acute changes in this. She sleeps with supplemental oxygen at night. No recent orthopnea, PND, or lower extremity edema.   Past Medical History:  Diagnosis Date  . Asthma   . CAD (coronary artery disease)    a. catheterization in 03/2018 showing 90% ostial OM stenosis with medical management recommended at that time  . Chronic anticoagulation   . COPD (chronic obstructive pulmonary disease) ([place])   . DJD (degenerative joint disease)   . Essential hypertension   . GERD (gastroesophageal reflux  disease)   . History of home oxygen therapy   . Hyperlipidemia   . Mobitz (type) II atrioventricular block   . Obesity   . Pacemaker    Implanted by Dr [name] (MDT) 10/06/10  . PAF (paroxysmal atrial fibrillation) ([place])   . Pancreatitis 2010 OR 2011  . Sleep apnea    CPAP  . Type 2 diabetes mellitus ([place])     Past Surgical History:  Procedure Laterality Date  . CATARACT EXTRACTION W/PHACO  11/02/2011   Procedure: CATARACT EXTRACTION PHACO AND INTRAOCULAR  LENS PLACEMENT (IOC);  Surgeon: Williams Che;  Location: [facility];  Service: Ophthalmology;  Laterality: Right;  CDE=7.33  . CATARACT EXTRACTION W/PHACO  12/07/2011   Procedure: CATARACT EXTRACTION PHACO AND INTRAOCULAR LENS PLACEMENT (IOC);  Surgeon: Williams Che, MD;  Location: [facility];  Service: Ophthalmology;  Laterality: Left;  CDE 3.61  . COLONOSCOPY WITH PROPOFOL N/A 08/09/2014   Procedure: COLONOSCOPY WITH PROPOFOL;  Surgeon: Juanita Craver, MD;  Location: [facility];  Service: Endoscopy;  Laterality: N/A;  . CYSTOCELE REPAIR    . ESOPHAGOGASTRODUODENOSCOPY (EGD) WITH PROPOFOL N/A 08/09/2014   Procedure: ESOPHAGOGASTRODUODENOSCOPY (EGD) WITH PROPOFOL;  Surgeon: Juanita Craver, MD;  Location: [facility];  Service: Endoscopy;  Laterality: N/A;  . EYE SURGERY  11/01/2012   BOTH EYES CATARACTS  . INSERT / REPLACE / REMOVE PACEMAKER  10/06/10   MDT  implanted by Dr [name]  . KNEE ARTHROSCOPY     both  . LEFT HEART CATH AND CORONARY ANGIOGRAPHY N/A 03/24/2018   Procedure: LEFT HEART CATH AND CORONARY ANGIOGRAPHY;  Surgeon: Lorretta Harp, MD;  Location: [facility];  Service: Cardiovascular;  Laterality: N/A;  . OVARY SURGERY     removal  . REPAIR RECTOCELE    . SIMPLE MASTECTOMY WITH AXILLARY SENTINEL NODE BIOPSY Left 01/09/2015   Procedure: Irrigation and Drainage Abcess left Axilla;  Surgeon: Jackolyn Confer, MD;  Location: [facility];  Service: General;  Laterality: Left;  . TONSILLECTOMY  AGE 79  . TOTAL ABDOMINAL HYSTERECTOMY  1971    Current Medications: Outpatient Medications Prior to Visit  Medication Sig Dispense Refill  . albuterol (PROAIR HFA) 108 (90 BASE) MCG/ACT inhaler Inhale 2 puffs into the lungs every 4 (four) hours as needed for wheezing.     Marland Kitchen apixaban (ELIQUIS) 5 MG TABS tablet Take 1 tablet (5 mg total) by mouth 2 (two) times daily. 180 tablet 3  . atorvastatin (LIPITOR) 40 MG tablet Take 40 mg by mouth daily.     . budesonide-formoterol (SYMBICORT) 160-4.5  MCG/ACT inhaler Inhale 2 puffs into the lungs 2 (two) times daily.    . DULoxetine (CYMBALTA) 60 MG capsule Take 60 mg by mouth daily.    . fenofibrate 160 MG tablet Take 160 mg by mouth every evening.    Marland Kitchen glipiZIDE (GLUCOTROL XL) 10 MG 24 hr tablet Take 20 mg by mouth daily with breakfast.     . ipratropium-albuterol (DUONEB) 0.5-2.5 (3) MG/3ML SOLN Take 3 mLs by nebulization 2 (two) times daily.    . isosorbide mononitrate (IMDUR) 30 MG 24 hr tablet Take 1 tablet (30 mg total) by mouth daily. 30 tablet 11  . lactobacillus acidophilus & bulgar (LACTINEX) chewable tablet Chew 1 tablet by mouth 3 (three) times daily with meals. 45 tablet 0  . LORazepam (ATIVAN) 0.5 MG tablet Take 0.25-0.5 mg by mouth 2 (two) times daily as needed for anxiety.     . metFORMIN (GLUCOPHAGE) 1000 MG tablet Take  1 tablet (1,000 mg total) by mouth 2 (two) times daily with a meal.    . metoprolol succinate (TOPROL-XL) 100 MG 24 hr tablet TAKE 1 TABLET BY MOUTH ONCE DAILY (Patient taking differently: Take 100 mg by mouth daily. ) 90 tablet 3  . oxybutynin (DITROPAN-XL) 10 MG 24 hr tablet Take 10 mg by mouth daily.     . Probiotic Product (ALIGN PO) Take 1 capsule by mouth daily.     Marland Kitchen spironolactone (ALDACTONE) 25 MG tablet Take 1 tablet (25 mg total) by mouth daily. 90 tablet 3  . telmisartan (MICARDIS) 40 MG tablet Take 1 tablet (40 mg total) by mouth daily. 90 tablet 3  . traMADol (ULTRAM) 50 MG tablet Take 1 tablet by mouth every 6 (six) hours as needed for pain.    Marland Kitchen triamcinolone ointment (KENALOG) 0.5 % Apply 1 application topically 2 (two) times daily.    Marland Kitchen amLODipine (NORVASC) 10 MG tablet Take 1 tablet (10 mg total) by mouth daily. 30 tablet 0  . aspirin 81 MG chewable tablet Chew 81 mg by mouth daily.     . furosemide (LASIX) 40 MG tablet Take 1 tablet (40 mg total) by mouth daily. 90 tablet 3  . nitroGLYCERIN (NITROSTAT) 0.4 MG SL tablet Place 1 tablet (0.4 mg total) under the tongue every 5 (five) minutes as  needed for chest pain. 25 tablet 3   No facility-administered medications prior to visit.      Allergies:   Bee venom and Latex   Social History   Socioeconomic History  . Marital status: Widowed    Spouse name: Not on file  . Number of children: 3  . Years of education: College  . Highest education level: Not on file  Occupational History  . Occupation: Part time Retail buyer: [employer]  . Financial resource strain: Not on file  . Food insecurity:    Worry: Not on file    Inability: Not on file  . Transportation needs:    Medical: Not on file    Non-medical: Not on file  Tobacco Use  . Smoking status: Former Smoker    Packs/day: 0.00    Years: 54.00    Pack years: 0.00    Types: Cigarettes    Last attempt to quit: 09/22/2017    Years since quitting: 0.9  . Smokeless tobacco: Former Systems developer    Quit date: 11/17/2011  Substance and Sexual Activity  . Alcohol use: Not Currently    Alcohol/week: 1.0 standard drinks    Types: 1 Glasses of wine per week    Comment: couple glasses of wine occasionally-once a month  . Drug use: No  . Sexual activity: Not on file  Lifestyle  . Physical activity:    Days per week: Not on file    Minutes per session: Not on file  . Stress: Not on file  Relationships  . Social connections:    Talks on phone: Not on file    Gets together: Not on file    Attends religious service: Not on file    Active member of club or organization: Not on file    Attends meetings of clubs or organizations: Not on file    Relationship status: Not on file  Other Topics Concern  . Not on file  Social History Narrative   Patient lives at home alone.   Caffeine Use: 16oz drink daily     Family History:  The patient's family history includes Congestive Heart Failure in her father and mother; Diabetes in her brother; Osteoarthritis in her sister; Prostate cancer in her brother.   Review of Systems:   Please see the history  of present illness.     General:  No chills, fever, night sweats or weight changes.  Cardiovascular:  No chest pain, edema, orthopnea, palpitations, paroxysmal nocturnal dyspnea. Positive for dyspnea on exertion.  Dermatological: No rash, lesions/masses Respiratory: No cough, dyspnea Urologic: No hematuria, dysuria Abdominal:   No nausea, vomiting, diarrhea, bright red blood per rectum, melena, or hematemesis Neurologic:  No changes in mental status. Positive for dizziness and vision changes.   All other systems reviewed and are otherwise negative except as noted above.   Physical Exam:    VS:  BP 120/66 (BP Location: Right Arm)   Pulse 87   Ht 5\' 7"  (1.702 m)   Wt 242 lb (109.8 kg)   SpO2 98%   BMI 37.90 kg/m    General: Well developed, well nourished Caucasian female appearing in no acute distress. Head: Normocephalic, atraumatic, sclera non-icteric, no xanthomas, nares are without discharge.  Neck: No carotid bruits. JVD not elevated.  Lungs: Respirations regular and unlabored, without wheezes or rales.  Heart: Regular rate and rhythm. No S3 or S4.  No murmur, no rubs, or gallops appreciated. Abdomen: Soft, non-tender, non-distended with normoactive bowel sounds. No hepatomegaly. No rebound/guarding. No obvious abdominal masses. Msk:  Strength and tone appear normal for age. No joint deformities or effusions. Extremities: No clubbing or cyanosis. No lower extremity edema.  Distal pedal pulses are 2+ bilaterally. Neuro: Alert and oriented X 3. Moves all extremities spontaneously. No focal deficits noted. Psych:  Responds to questions appropriately with a normal affect. Skin: No rashes or lesions noted  Wt Readings from Last 3 Encounters:  09/09/18 242 lb (109.8 kg)  08/29/18 236 lb 8 oz (107.3 kg)  08/12/18 233 lb 6.4 oz (105.9 kg)     Studies/Labs Reviewed:   EKG:  EKG is not ordered today.   Recent Labs: 10/20/2017: NT-Pro BNP 257 03/23/2018: B Natriuretic Peptide  127.4; TSH 1.508 03/24/2018: Magnesium 1.4 06/30/2018: ALT 14 08/12/2018: Hemoglobin 11.1; Platelets 309 09/07/2018: BUN 23; Creatinine, Ser 1.13; Potassium 4.2; Sodium 146   Lipid Panel    Component Value Date/Time   CHOL 125 03/24/2018 0508   TRIG 194 (H) 03/24/2018 0508   HDL 35 (L) 03/24/2018 0508   CHOLHDL 3.6 03/24/2018 0508   VLDL 39 03/24/2018 0508   LDLCALC 51 03/24/2018 0508    Additional studies/ records that were reviewed today include:   Cardiac Catheterization: 03/2018  Ost 2nd Mrg lesion is 90% stenosed.  The left ventricular systolic function is normal.  LV end diastolic pressure is normal.  The left ventricular ejection fraction is 55-65% by visual estimate.   IMPRESSION: Ms. Meline has a 90% ostial OM inferior subbranch stenosis.  That is her only obstructive lesion in her coronary tree.  She says she has minimal angina.  She has normal LV function.  At this point, I recommend medical therapy.  Should she have recalcitrant symptoms she would be a candidate for Cutting Balloon atherectomy of the origin of that subbranch.  The radial sheath was removed and a TR band was placed in the right wrist to achieve patent hemostasis.  A femoral angiogram was performed and a minx closure device was deployed successfully achieving hemostasis.  Patient left the lab in stable condition.  She can restart  her Eliquis in the next 48 hours.  Echocardiogram: 07/2018 Study Conclusions  - Left ventricle: The cavity size was normal. Wall thickness was   normal. Systolic function was normal. The estimated ejection   fraction was in the range of 55% to 60%. Wall motion was normal;   there were no regional wall motion abnormalities. Indeterminate   diastolic function. - Aortic valve: Mildly to moderately calcified annulus. Trileaflet. - Mitral valve: Mildly thickened leaflets. There was mild   regurgitation. - Right ventricle: Pacer wire or catheter noted in right ventricle. - Right  atrium: Central venous pressure (est): 3 mm Hg. - Atrial septum: No defect or patent foramen ovale was identified. - Tricuspid valve: There was trivial regurgitation. - Pulmonary arteries: Systolic pressure could not be accurately   estimated. - Pericardium, extracardiac: There was no pericardial effusion.  Assessment:    1. Coronary artery disease involving native coronary artery of native heart without angina pectoris   2. Paroxysmal atrial fibrillation (HCC)   3. Mobitz type II atrioventricular block   4. Chronic diastolic heart failure ([place])   5. Essential hypertension   6. Dizziness   7. Chronic obstructive pulmonary disease, unspecified COPD type ([place])      Plan:   In order of problems listed above:  1. CAD - s/p catheterization in 03/2018 showing 90% ostial OM stenosis with medical management recommended. Cath report reviewed with the patient and her family today. She has baseline dyspnea on exertion but denies any recurrent chest pain similar to when she underwent cath or required admission. - remains on BB, Imdur, and statin therapy. She is not taking ASA regularly and does not have a strong indication for this currently as she is also on Eliquis for anticoagulation.   2.  Paroxysmal Atrial Fibrillation/2nd degree AV Block - s/p PPM placement in 2011 which is followed by Dr. [name]. She does have occasional episodes of paroxysmal atrial fibrillation but denies any associated palpitations with this. She is maintaining normal sinus rhythm by examination today. Continue Toprol-XL 100 mg daily for rate control. -She denies any evidence of active bleeding. Remains on Eliquis 5 mg twice daily for anticoagulation.  3. Chronic Diastolic CHF - She has baseline dyspnea on exertion but denies any acute changes in this. No recent orthopnea, PND, or lower extremity edema.  She has lost weight over the past several months in the setting of recurrent UTI's and frequent hospitalizations.  Recent labs did show evidence of dehydration with a creatinine trending up to 1.62 but this has improved to 1.13 by repeat labs on 09/07/2018 which could have influenced her dizziness as well. - Recommended she reduce her Lasix dosing from 40 mg daily to 20 mg daily and follow symptoms and daily weights closely.   4. HTN - BP has improved and is well controlled at 120/66 during today's visit. Will continue on Amlodipine 5 mg daily, Imdur 30 mg daily, Toprol-XL 100 mg daily, Spironolactone 25 mg daily, and Micardis 40 mg daily.  5. Dizziness - likely multifactorial in the setting of hypotension and hypoglycemia. I suspect the episodes of her seeing "spots in her visual field" were likely due to hypoglycemia as she reports glucose was dropping into the 30's to 50's at that time and this improved with dose reduction of her Glipizide. Reports positional dizziness has now resolved with dose reduction of Amlodipine and Micardis. Would continue on current BP medications at this time. Will defer further dose adjustment of her Glipizide and Metformin to  her PCP.  - she denies any associated syncope, palpitations, chest pain, or dyspnea with the events and given symptoms improved with the above changes, no indication for a cardiac monitor at this time.   6. COPD - followed by her PCP. No active wheezing on examination today. Uses supplemental oxygen at night.   Medication Adjustments/Labs and Tests Ordered: Current medicines are reviewed at length with the patient today.  Concerns regarding medicines are outlined above.  Medication changes, Labs and Tests ordered today are listed in the Patient Instructions below. Patient Instructions  Medication Instructions:  STOP ASPIRIN   DECREASE LASIX TO 20 MG DAILY   Labwork: NONE  Testing/Procedures: NONE  Follow-Up: Your physician recommends that you schedule a follow-up appointment in: 2-3 MONTHS   Any Other Special Instructions Will Be Listed Below (If  Applicable).  If you need a refill on your cardiac medications before your next appointment, please call your pharmacy.    Signed, Erma Heritage, PA-C  09/09/2018 4:10 PM    [facility] [address] [place], [place] [address] Phone: [phone]

## 2018-09-09 NOTE — Patient Instructions (Signed)
Medication Instructions:  STOP ASPIRIN   DECREASE LASIX TO 20 MG DAILY   Labwork: NONE  Testing/Procedures: NONE  Follow-Up: Your physician recommends that you schedule a follow-up appointment in: 2-3 MONTHS    Any Other Special Instructions Will Be Listed Below (If Applicable).     If you need a refill on your cardiac medications before your next appointment, please call your pharmacy.

## 2018-09-23 LAB — CUP PACEART REMOTE DEVICE CHECK
Battery Impedance: 931 Ohm
Battery Remaining Longevity: 56 mo
Brady Statistic AP VS Percent: 0 %
Brady Statistic AS VS Percent: 12 %
Implantable Lead Implant Date: 20111121
Implantable Lead Implant Date: 20111121
Implantable Lead Location: 753860
Implantable Lead Model: 4469
Implantable Lead Model: 4470
Implantable Lead Serial Number: 687643
Lead Channel Impedance Value: 473 Ohm
Lead Channel Pacing Threshold Amplitude: 0.75 V
Lead Channel Pacing Threshold Pulse Width: 0.4 ms
Lead Channel Setting Pacing Amplitude: 2 V
MDC IDC LEAD LOCATION: 753859
MDC IDC LEAD SERIAL: 548226
MDC IDC MSMT BATTERY VOLTAGE: 2.76 V
MDC IDC MSMT LEADCHNL RA IMPEDANCE VALUE: 408 Ohm
MDC IDC PG IMPLANT DT: 20111121
MDC IDC SESS DTM: 20191015173902
MDC IDC SET LEADCHNL RV PACING AMPLITUDE: 2.5 V
MDC IDC SET LEADCHNL RV PACING PULSEWIDTH: 0.4 ms
MDC IDC SET LEADCHNL RV SENSING SENSITIVITY: 4 mV
MDC IDC STAT BRADY AP VP PERCENT: 81 %
MDC IDC STAT BRADY AS VP PERCENT: 7 %

## 2018-11-02 ENCOUNTER — Encounter: Payer: Medicare HMO | Admitting: Internal Medicine

## 2018-11-18 ENCOUNTER — Other Ambulatory Visit: Payer: Self-pay | Admitting: Internal Medicine

## 2018-12-01 ENCOUNTER — Telehealth: Payer: Self-pay

## 2018-12-01 NOTE — Telephone Encounter (Signed)
Left message for patient to remind of missed remote transmission.  

## 2018-12-02 ENCOUNTER — Encounter: Payer: Self-pay | Admitting: Cardiology

## 2018-12-06 ENCOUNTER — Ambulatory Visit (INDEPENDENT_AMBULATORY_CARE_PROVIDER_SITE_OTHER): Payer: Medicare HMO

## 2018-12-06 DIAGNOSIS — I441 Atrioventricular block, second degree: Secondary | ICD-10-CM

## 2018-12-06 DIAGNOSIS — I5032 Chronic diastolic (congestive) heart failure: Secondary | ICD-10-CM

## 2018-12-06 NOTE — Progress Notes (Signed)
Cardiology Office Note  Date: 12/07/2018   ID: Brandi Hunter, DOB 1941/02/04, MRN [id number]  PCP: [name], [place]  Primary Cardiologist: Rozann Lesches, MD   Chief Complaint  Patient presents with  . Cardiac follow-up    History of Present Illness: Brandi Hunter is a 78 y.o. female last seen by Brandi Hunter in October 2019.  She presents for a follow-up visit.  She states that in the last few weeks she has been having lower oxygen levels with activity.  She has known COPD and uses oxygen at nighttime, but during the day has had saturations dipping into the mid to high 80s occasionally.  She is using MDIs, states that her nebulizer is not functioning correctly.  She is due to see her PCP tomorrow.  Has intermittent wheezing, but no fevers or chills, no cough.  From a cardiac perspective she does not report any angina or palpitations.  Her weight has not increased significantly.  Lower leg edema is mild and unchanged. Lasix was reduced to 20 mg daily at the last office visit.  Cardiac testing from May of last year showed branch vessel disease that was managed medically and normal LVEF at 55 to 60%.  Past Medical History:  Diagnosis Date  . Asthma   . CAD (coronary artery disease)    a. catheterization in 03/2018 showing 90% ostial OM stenosis with medical management recommended at that time  . Chronic anticoagulation   . COPD (chronic obstructive pulmonary disease) ([place])   . DJD (degenerative joint disease)   . Essential hypertension   . GERD (gastroesophageal reflux disease)   . History of home oxygen therapy   . Hyperlipidemia   . Mobitz (type) II atrioventricular block   . Obesity   . Pacemaker    Implanted by Dr [name] (MDT) 10/06/10  . PAF (paroxysmal atrial fibrillation) ([place])   . Pancreatitis 2010 OR 2011  . Sleep apnea    CPAP  . Type 2 diabetes mellitus ([place])     Past Surgical History:  Procedure Laterality Date  . CATARACT EXTRACTION W/PHACO  11/02/2011     Procedure: CATARACT EXTRACTION PHACO AND INTRAOCULAR LENS PLACEMENT (IOC);  Surgeon: Williams Che;  Location: [facility];  Service: Ophthalmology;  Laterality: Right;  CDE=7.33  . CATARACT EXTRACTION W/PHACO  12/07/2011   Procedure: CATARACT EXTRACTION PHACO AND INTRAOCULAR LENS PLACEMENT (IOC);  Surgeon: Williams Che, MD;  Location: [facility];  Service: Ophthalmology;  Laterality: Left;  CDE 3.61  . COLONOSCOPY WITH PROPOFOL N/A 08/09/2014   Procedure: COLONOSCOPY WITH PROPOFOL;  Surgeon: Juanita Craver, MD;  Location: [facility];  Service: Endoscopy;  Laterality: N/A;  . CYSTOCELE REPAIR    . ESOPHAGOGASTRODUODENOSCOPY (EGD) WITH PROPOFOL N/A 08/09/2014   Procedure: ESOPHAGOGASTRODUODENOSCOPY (EGD) WITH PROPOFOL;  Surgeon: Juanita Craver, MD;  Location: [facility];  Service: Endoscopy;  Laterality: N/A;  . EYE SURGERY  11/01/2012   BOTH EYES CATARACTS  . INSERT / REPLACE / REMOVE PACEMAKER  10/06/10   MDT  implanted by Dr [name]  . KNEE ARTHROSCOPY     both  . LEFT HEART CATH AND CORONARY ANGIOGRAPHY N/A 03/24/2018   Procedure: LEFT HEART CATH AND CORONARY ANGIOGRAPHY;  Surgeon: Lorretta Harp, MD;  Location: [facility];  Service: Cardiovascular;  Laterality: N/A;  . OVARY SURGERY     removal  . REPAIR RECTOCELE    . SIMPLE MASTECTOMY WITH AXILLARY SENTINEL NODE BIOPSY Left 01/09/2015   Procedure: Irrigation and Drainage  Abcess left Axilla;  Surgeon: Jackolyn Confer, MD;  Location: [facility];  Service: General;  Laterality: Left;  . TONSILLECTOMY  AGE 51  . TOTAL ABDOMINAL HYSTERECTOMY  1971    Current Outpatient Medications  Medication Sig Dispense Refill  . albuterol (PROAIR HFA) 108 (90 BASE) MCG/ACT inhaler Inhale 2 puffs into the lungs every 4 (four) hours as needed for wheezing.     Marland Kitchen amLODipine (NORVASC) 5 MG tablet Take 1 tablet (5 mg total) by mouth daily. 90 tablet 3  . apixaban (ELIQUIS) 5 MG TABS tablet Take 1 tablet (5 mg total) by mouth 2 (two) times daily. 180 tablet  3  . atorvastatin (LIPITOR) 40 MG tablet Take 40 mg by mouth daily.     . budesonide-formoterol (SYMBICORT) 160-4.5 MCG/ACT inhaler Inhale 2 puffs into the lungs 2 (two) times daily.    . DULoxetine (CYMBALTA) 60 MG capsule Take 60 mg by mouth daily.    . fenofibrate 160 MG tablet Take 160 mg by mouth every evening.    . furosemide (LASIX) 20 MG tablet Take 1 tablet (20 mg total) by mouth daily. 90 tablet 3  . glipiZIDE (GLUCOTROL XL) 10 MG 24 hr tablet Take 20 mg by mouth daily with breakfast.     . ipratropium-albuterol (DUONEB) 0.5-2.5 (3) MG/3ML SOLN Take 3 mLs by nebulization 2 (two) times daily.    . isosorbide mononitrate (IMDUR) 30 MG 24 hr tablet Take 1 tablet (30 mg total) by mouth daily. 30 tablet 11  . LORazepam (ATIVAN) 0.5 MG tablet Take 0.25-0.5 mg by mouth 2 (two) times daily as needed for anxiety.     . metFORMIN (GLUCOPHAGE) 1000 MG tablet Take 1 tablet (1,000 mg total) by mouth 2 (two) times daily with a meal.    . metoprolol succinate (TOPROL-XL) 100 MG 24 hr tablet TAKE 1 TABLET BY MOUTH ONCE DAILY 90 tablet 2  . nitroGLYCERIN (NITROSTAT) 0.4 MG SL tablet Place 1 tablet (0.4 mg total) under the tongue every 5 (five) minutes as needed for chest pain. 25 tablet 3  . oxybutynin (DITROPAN-XL) 10 MG 24 hr tablet Take 10 mg by mouth daily.     . Probiotic Product (ALIGN PO) Take 1 capsule by mouth daily.     Marland Kitchen spironolactone (ALDACTONE) 25 MG tablet Take 1 tablet (25 mg total) by mouth daily. 90 tablet 3  . telmisartan (MICARDIS) 40 MG tablet Take 20 mg by mouth daily.    . traMADol (ULTRAM) 50 MG tablet Take 1 tablet by mouth every 6 (six) hours as needed for pain.    Marland Kitchen triamcinolone ointment (KENALOG) 0.5 % Apply 1 application topically 2 (two) times daily.     No current facility-administered medications for this visit.    Allergies:  Bee venom and Latex   Social History: The patient  reports that she quit smoking about 14 months ago. Her smoking use included cigarettes. She  smoked 0.00 packs per day for 54.00 years. She quit smokeless tobacco use about 7 years ago. She reports previous alcohol use of about 1.0 standard drinks of alcohol per week. She reports that she does not use drugs.   ROS:  Please see the history of present illness. Otherwise, complete review of systems is positive for none.  All other systems are reviewed and negative.   Physical Exam: VS:  BP (!) 152/90   Pulse 78   Ht 5\' 7"  (1.702 m)   Wt 241 lb 9.6 oz (109.6 kg)   SpO2  94%   BMI 37.84 kg/m , BMI Body mass index is 37.84 kg/m.  Wt Readings from Last 3 Encounters:  12/07/18 241 lb 9.6 oz (109.6 kg)  09/09/18 242 lb (109.8 kg)  08/29/18 236 lb 8 oz (107.3 kg)    General: Obese woman, appears comfortable at rest. HEENT: Conjunctiva and lids normal, oropharynx clear. Neck: Supple, no elevated JVP or carotid bruits, no thyromegaly. Lungs: Prolonged expiratory phase but no wheezing, nonlabored breathing at rest. Cardiac: Regular rate and rhythm, no S3, soft systolic murmur, no pericardial rub. Abdomen: Soft, nontender, bowel sounds present. Extremities: Mild ankle and 1+ lower leg edema, distal pulses 2+. Skin: Warm and dry. Musculoskeletal: No kyphosis. Neuropsychiatric: Alert and oriented x3, affect grossly appropriate.  ECG: I personally reviewed the tracing from 08/12/2018 which showed a ventricular paced rhythm.  Recent Labwork: 03/23/2018: B Natriuretic Peptide 127.4; TSH 1.508 03/24/2018: Magnesium 1.4 06/30/2018: ALT 14; AST 21 08/12/2018: Hemoglobin 11.1; Platelets 309 09/07/2018: BUN 23; Creatinine, Ser 1.13; Potassium 4.2; Sodium 146     Component Value Date/Time   CHOL 125 03/24/2018 0508   TRIG 194 (H) 03/24/2018 0508   HDL 35 (L) 03/24/2018 0508   CHOLHDL 3.6 03/24/2018 0508   VLDL 39 03/24/2018 0508   LDLCALC 51 03/24/2018 0508    Other Studies Reviewed Today:  Echocardiogram 07/19/2018: Study Conclusions  - Left ventricle: The cavity size was normal. Wall  thickness was   normal. Systolic function was normal. The estimated ejection   fraction was in the range of 55% to 60%. Wall motion was normal;   there were no regional wall motion abnormalities. Indeterminate   diastolic function. - Aortic valve: Mildly to moderately calcified annulus. Trileaflet. - Mitral valve: Mildly thickened leaflets. There was mild   regurgitation. - Right ventricle: Pacer wire or catheter noted in right ventricle. - Right atrium: Central venous pressure (est): 3 mm Hg. - Atrial septum: No defect or patent foramen ovale was identified. - Tricuspid valve: There was trivial regurgitation. - Pulmonary arteries: Systolic pressure could not be accurately   estimated. - Pericardium, extracardiac: There was no pericardial effusion.  Cardiac catheterization 03/24/2018:  Ost 2nd Mrg lesion is 90% stenosed.  The left ventricular systolic function is normal.  LV end diastolic pressure is normal.  The left ventricular ejection fraction is 55-65% by visual estimate.  Assessment and Plan:  1.  CAD, branch vessel involving the ostial OM that was managed medically at cardiac catheterization in May 2019.  She does not report any progressive angina symptoms.  Continue statin therapy, she is not on aspirin with concurrent use of Eliquis.  2.  Paroxysmal atrial fibrillation.  She continues on Eliquis for stroke prophylaxis, no bleeding problems.  She does not report any palpitations and continues on beta-blocker therapy.  3.  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  She follows with Dr. [name].  4.  Diastolic dysfunction, weight is stable and she is tolerating low-dose Lasix.  LVEF 55 to 60%.  No changes were made in current regimen.  5.  Reported hypoxic respiratory failure, possibly acute on chronic.  She is not wheezing today, denies any fevers or chills, no cough.  She has known COPD and uses oxygen at nighttime already.  States that she has a  pulmonologist that she has not seen recently.  She is due to follow-up with her PCP tomorrow.  Current medicines were reviewed with the patient today.  Disposition: Follow-up in 6 months.  Signed, Aloha Gell  Domenic Polite, MD, [facility] 12/07/2018 9:46 AM    [facility] [address], [place], [place] [address] Phone: [phone]; Fax: [phone]

## 2018-12-07 ENCOUNTER — Ambulatory Visit: Payer: Medicare HMO | Admitting: Cardiology

## 2018-12-07 ENCOUNTER — Encounter: Payer: Self-pay | Admitting: Cardiology

## 2018-12-07 VITALS — BP 152/90 | HR 78 | Ht 67.0 in | Wt 241.6 lb

## 2018-12-07 DIAGNOSIS — J449 Chronic obstructive pulmonary disease, unspecified: Secondary | ICD-10-CM

## 2018-12-07 DIAGNOSIS — I25119 Atherosclerotic heart disease of native coronary artery with unspecified angina pectoris: Secondary | ICD-10-CM | POA: Diagnosis not present

## 2018-12-07 DIAGNOSIS — I441 Atrioventricular block, second degree: Secondary | ICD-10-CM

## 2018-12-07 DIAGNOSIS — I480 Paroxysmal atrial fibrillation: Secondary | ICD-10-CM | POA: Diagnosis not present

## 2018-12-07 MED ORDER — AMLODIPINE BESYLATE 5 MG PO TABS: 5.0000 mg | ORAL_TABLET | Freq: Every day | ORAL | 3 refills | Status: DC

## 2018-12-07 MED ORDER — FUROSEMIDE 20 MG PO TABS: 20.0000 mg | ORAL_TABLET | Freq: Every day | ORAL | 3 refills | Status: DC

## 2018-12-07 NOTE — Progress Notes (Signed)
Remote pacemaker transmission.   

## 2018-12-07 NOTE — Patient Instructions (Signed)

## 2018-12-08 ENCOUNTER — Ambulatory Visit: Payer: Medicare HMO

## 2018-12-08 DIAGNOSIS — I441 Atrioventricular block, second degree: Secondary | ICD-10-CM

## 2018-12-08 LAB — CUP PACEART REMOTE DEVICE CHECK
Battery Impedance: 1229 Ohm
Battery Remaining Longevity: 48 mo
Brady Statistic AP VP Percent: 62 %
Brady Statistic AP VS Percent: 1 %
Brady Statistic AS VP Percent: 4 %
Brady Statistic AS VS Percent: 33 %
Implantable Lead Implant Date: 20111121
Implantable Lead Implant Date: 20111121
Implantable Lead Location: 753859
Implantable Lead Model: 4470
Implantable Lead Serial Number: 687643
Implantable Pulse Generator Implant Date: 20111121
Lead Channel Impedance Value: 414 Ohm
Lead Channel Impedance Value: 492 Ohm
Lead Channel Setting Pacing Amplitude: 2 V
Lead Channel Setting Pacing Amplitude: 2.5 V
Lead Channel Setting Pacing Pulse Width: 0.4 ms
MDC IDC LEAD LOCATION: 753860
MDC IDC LEAD SERIAL: 548226
MDC IDC MSMT BATTERY VOLTAGE: 2.75 V
MDC IDC MSMT LEADCHNL RV PACING THRESHOLD AMPLITUDE: 0.75 V
MDC IDC MSMT LEADCHNL RV PACING THRESHOLD PULSEWIDTH: 0.4 ms
MDC IDC SESS DTM: 20200121165235
MDC IDC SET LEADCHNL RV SENSING SENSITIVITY: 4 mV

## 2018-12-09 ENCOUNTER — Encounter: Payer: Self-pay | Admitting: Cardiology

## 2018-12-09 NOTE — Progress Notes (Signed)
Remote pacemaker transmission.   

## 2018-12-12 ENCOUNTER — Ambulatory Visit: Payer: Medicare HMO | Admitting: Physician Assistant

## 2018-12-12 ENCOUNTER — Encounter: Payer: Self-pay | Admitting: Physician Assistant

## 2018-12-12 ENCOUNTER — Telehealth: Payer: Self-pay

## 2018-12-12 VITALS — BP 142/82 | HR 85 | Ht 67.0 in | Wt 242.0 lb

## 2018-12-12 DIAGNOSIS — I2583 Coronary atherosclerosis due to lipid rich plaque: Secondary | ICD-10-CM

## 2018-12-12 DIAGNOSIS — Z79899 Other long term (current) drug therapy: Secondary | ICD-10-CM

## 2018-12-12 DIAGNOSIS — I5033 Acute on chronic diastolic (congestive) heart failure: Secondary | ICD-10-CM

## 2018-12-12 DIAGNOSIS — I2510 Atherosclerotic heart disease of native coronary artery without angina pectoris: Secondary | ICD-10-CM

## 2018-12-12 DIAGNOSIS — J449 Chronic obstructive pulmonary disease, unspecified: Secondary | ICD-10-CM

## 2018-12-12 DIAGNOSIS — I10 Essential (primary) hypertension: Secondary | ICD-10-CM | POA: Diagnosis not present

## 2018-12-12 DIAGNOSIS — I480 Paroxysmal atrial fibrillation: Secondary | ICD-10-CM

## 2018-12-12 NOTE — Patient Instructions (Signed)
Medication Instructions:  Your physician has recommended you make the following change in your medication:  Increase Lasix to 40 mg Two Times Daily for 3 Days then resume Lasix 40 mg Daily.   If you need a refill on your cardiac medications before your next appointment, please call your pharmacy.   Lab work: Your physician recommends that you return for lab work in: Friday 12/16/2018  If you have labs (blood work) drawn today and your tests are completely normal, you will receive your results only by: Marland Kitchen MyChart Message (if you have MyChart) OR . A paper copy in the mail If you have any lab test that is abnormal or we need to change your treatment, we will call you to review the results.  Testing/Procedures: NONE   Follow-Up: At [facility], you and your health needs are our priority.  As part of our continuing mission to provide you with exceptional heart care, we have created designated Provider Care Teams.  These Care Teams include your primary Cardiologist (physician) and Advanced Practice Providers (APPs -  Physician Assistants and Nurse Practitioners) who all work together to provide you with the care you need, when you need it. You will need a follow up appointment in 2 weeks.  Please call our office 2 months in advance to schedule this appointment.  You may see Thompson Grayer, MD or one of the following Advanced Practice Providers on your designated Care Team:   Bernerd Pho, PA-C [facility]) . Ermalinda Barrios, PA-C ([place])  Any Other Special Instructions Will Be Listed Below (If Applicable). Thank you for choosing [facility]!

## 2018-12-12 NOTE — Progress Notes (Signed)
Cardiology Office Note    Date:  12/12/2018   ID:  STOREY STANGELAND, DOB 03/12/1941, MRN [id number]  PCP:  [name], [place]  Cardiologist: Thompson Grayer, MD EPS: None  Chief Complaint  Patient presents with  . Shortness of Breath    History of Present Illness:  Brandi Hunter is a 78 y.o. female history of CAD cath 03/2018 branch vessel disease of the ostial OM managed medically, PAF on Eliquis, status post [employer]tr[employer] pacemaker for second-degree AV block, diastolic CHF Lasix reduced 08/2018 because of dehydration and creatinine trending up as well as dizziness, chronic hypoxic respiratory failure on home O2.  Asked echo 07/2018 LVEF 55 to 60% and indeterminate diastolic function.    Saw Dr. Domenic Polite 12/07/2018 and was felt to be stable.  Saw PCP 12/10/2018 and BNP was over 2000 & CXR showed pulmonary congestion. She increased lasix to 40 mg Fri/sat/sun and hasn't noticed any improvement.  No significant leg edema on exam, weight is unchanged.  She gets short of breath with very little activity in the house wearing her oxygen 24/7. she says she probably has got extra salt in her diet.    Past Medical History:  Diagnosis Date  . Asthma   . CAD (coronary artery disease)    a. catheterization in 03/2018 showing 90% ostial OM stenosis with medical management recommended at that time  . Chronic anticoagulation   . COPD (chronic obstructive pulmonary disease) ([place])   . DJD (degenerative joint disease)   . Essential hypertension   . GERD (gastroesophageal reflux disease)   . History of home oxygen therapy   . Hyperlipidemia   . Mobitz (type) II atrioventricular block   . Obesity   . Pacemaker    Implanted by Dr [name] (MDT) 10/06/10  . PAF (paroxysmal atrial fibrillation) ([place])   . Pancreatitis 2010 OR 2011  . Sleep apnea    CPAP  . Type 2 diabetes mellitus ([place])     Past Surgical History:  Procedure Laterality Date  . CATARACT EXTRACTION W/PHACO  11/02/2011   Procedure: CATARACT  EXTRACTION PHACO AND INTRAOCULAR LENS PLACEMENT (IOC);  Surgeon: Williams Che;  Location: [facility];  Service: Ophthalmology;  Laterality: Right;  CDE=7.33  . CATARACT EXTRACTION W/PHACO  12/07/2011   Procedure: CATARACT EXTRACTION PHACO AND INTRAOCULAR LENS PLACEMENT (IOC);  Surgeon: Williams Che, MD;  Location: [facility];  Service: Ophthalmology;  Laterality: Left;  CDE 3.61  . COLONOSCOPY WITH PROPOFOL N/A 08/09/2014   Procedure: COLONOSCOPY WITH PROPOFOL;  Surgeon: Juanita Craver, MD;  Location: [facility];  Service: Endoscopy;  Laterality: N/A;  . CYSTOCELE REPAIR    . ESOPHAGOGASTRODUODENOSCOPY (EGD) WITH PROPOFOL N/A 08/09/2014   Procedure: ESOPHAGOGASTRODUODENOSCOPY (EGD) WITH PROPOFOL;  Surgeon: Juanita Craver, MD;  Location: [facility];  Service: Endoscopy;  Laterality: N/A;  . EYE SURGERY  11/01/2012   BOTH EYES CATARACTS  . INSERT / REPLACE / REMOVE PACEMAKER  10/06/10   MDT  implanted by Dr [name]  . KNEE ARTHROSCOPY     both  . LEFT HEART CATH AND CORONARY ANGIOGRAPHY N/A 03/24/2018   Procedure: LEFT HEART CATH AND CORONARY ANGIOGRAPHY;  Surgeon: Lorretta Harp, MD;  Location: [facility];  Service: Cardiovascular;  Laterality: N/A;  . OVARY SURGERY     removal  . REPAIR RECTOCELE    . SIMPLE MASTECTOMY WITH AXILLARY SENTINEL NODE BIOPSY Left 01/09/2015   Procedure: Irrigation and Drainage Abcess left Axilla;  Surgeon: Jackolyn Confer, MD;  Location:  [facility];  Service: General;  Laterality: Left;  . TONSILLECTOMY  AGE 53  . TOTAL ABDOMINAL HYSTERECTOMY  1971    Current Medications: Current Meds  Medication Sig  . albuterol (PROAIR HFA) 108 (90 BASE) MCG/ACT inhaler Inhale 2 puffs into the lungs every 4 (four) hours as needed for wheezing.   Marland Kitchen amLODipine (NORVASC) 5 MG tablet Take 1 tablet (5 mg total) by mouth daily.  Marland Kitchen apixaban (ELIQUIS) 5 MG TABS tablet Take 1 tablet (5 mg total) by mouth 2 (two) times daily.  Marland Kitchen atorvastatin (LIPITOR) 40 MG tablet Take 40 mg by mouth  daily.   . budesonide-formoterol (SYMBICORT) 160-4.5 MCG/ACT inhaler Inhale 2 puffs into the lungs 2 (two) times daily.  . DULoxetine (CYMBALTA) 60 MG capsule Take 60 mg by mouth daily.  . fenofibrate 160 MG tablet Take 160 mg by mouth every evening.  . furosemide (LASIX) 20 MG tablet Take 1 tablet (20 mg total) by mouth daily.  Marland Kitchen glipiZIDE (GLUCOTROL XL) 10 MG 24 hr tablet Take 20 mg by mouth daily with breakfast.   . ipratropium-albuterol (DUONEB) 0.5-2.5 (3) MG/3ML SOLN Take 3 mLs by nebulization 2 (two) times daily.  . isosorbide mononitrate (IMDUR) 30 MG 24 hr tablet Take 1 tablet (30 mg total) by mouth daily.  Marland Kitchen LORazepam (ATIVAN) 0.5 MG tablet Take 0.25-0.5 mg by mouth 2 (two) times daily as needed for anxiety.   . metFORMIN (GLUCOPHAGE) 1000 MG tablet Take 1 tablet (1,000 mg total) by mouth 2 (two) times daily with a meal.  . metoprolol succinate (TOPROL-XL) 100 MG 24 hr tablet TAKE 1 TABLET BY MOUTH ONCE DAILY  . nitroGLYCERIN (NITROSTAT) 0.4 MG SL tablet Place 1 tablet (0.4 mg total) under the tongue every 5 (five) minutes as needed for chest pain.  Marland Kitchen oxybutynin (DITROPAN-XL) 10 MG 24 hr tablet Take 10 mg by mouth daily.   . Probiotic Product (ALIGN PO) Take 1 capsule by mouth daily.   Marland Kitchen spironolactone (ALDACTONE) 25 MG tablet Take 1 tablet (25 mg total) by mouth daily.  Marland Kitchen telmisartan (MICARDIS) 40 MG tablet Take 20 mg by mouth daily.  . traMADol (ULTRAM) 50 MG tablet Take 1 tablet by mouth every 6 (six) hours as needed for pain.  Marland Kitchen triamcinolone ointment (KENALOG) 0.5 % Apply 1 application topically 2 (two) times daily.     Allergies:   Bee venom and Latex   Social History   Socioeconomic History  . Marital status: Widowed    Spouse name: Not on file  . Number of children: 3  . Years of education: College  . Highest education level: Not on file  Occupational History  . Occupation: Part time Retail buyer: [employer] Needs  . Financial resource  strain: Not on file  . Food insecurity:    Worry: Not on file    Inability: Not on file  . Transportation needs:    Medical: Not on file    Non-medical: Not on file  Tobacco Use  . Smoking status: Former Smoker    Packs/day: 0.00    Years: 54.00    Pack years: 0.00    Types: Cigarettes    Last attempt to quit: 09/22/2017    Years since quitting: 1.2  . Smokeless tobacco: Former Systems developer    Quit date: 11/17/2011  Substance and Sexual Activity  . Alcohol use: Not Currently    Alcohol/week: 1.0 standard drinks    Types: 1 Glasses of wine per  week    Comment: couple glasses of wine occasionally-once a month  . Drug use: No  . Sexual activity: Not on file  Lifestyle  . Physical activity:    Days per week: Not on file    Minutes per session: Not on file  . Stress: Not on file  Relationships  . Social connections:    Talks on phone: Not on file    Gets together: Not on file    Attends religious service: Not on file    Active member of club or organization: Not on file    Attends meetings of clubs or organizations: Not on file    Relationship status: Not on file  Other Topics Concern  . Not on file  Social History Narrative   Patient lives at home alone.   Caffeine Use: 16oz drink daily     Family History:  The patient's family history includes Congestive Heart Failure in her father and mother; Diabetes in her brother; Osteoarthritis in her sister; Prostate cancer in her brother.   ROS:   Please see the history of present illness.    Review of Systems  Constitution: Negative.  HENT: Negative.   Eyes: Negative.   Cardiovascular: Positive for dyspnea on exertion.  Respiratory: Negative.   Hematologic/Lymphatic: Negative.   Musculoskeletal: Negative.  Negative for joint pain.  Gastrointestinal: Negative.   Genitourinary: Negative.   Neurological: Negative.    All other systems reviewed and are negative.   PHYSICAL EXAM:   VS:  BP (!) 142/82   Pulse 85   Ht 5' 7" (1.702  m)   Wt 242 lb (109.8 kg)   SpO2 96%   BMI 37.90 kg/m   Physical Exam  GEN: Obese, in no acute distress  Neck: Slight increase JVD, no carotid bruits, or masses Cardiac:RRR; no murmurs, rubs, or gallops  Respiratory: Decreased breath sounds throughout no significant rales or rhonchi GI: soft, nontender, nondistended, + BS Ext: without cyanosis, clubbing, or edema, Good distal pulses bilaterally Neuro:  Alert and Oriented x 3 Psych: euthymic mood, full affect  Wt Readings from Last 3 Encounters:  12/12/18 242 lb (109.8 kg)  12/07/18 241 lb 9.6 oz (109.6 kg)  09/09/18 242 lb (109.8 kg)      Studies/Labs Reviewed:   EKG:  EKG is not ordered today.    Recent Labs: 03/23/2018: B Natriuretic Peptide 127.4; TSH 1.508 03/24/2018: Magnesium 1.4 06/30/2018: ALT 14 08/12/2018: Hemoglobin 11.1; Platelets 309 09/07/2018: BUN 23; Creatinine, Ser 1.13; Potassium 4.2; Sodium 146   Lipid Panel    Component Value Date/Time   CHOL 125 03/24/2018 0508   TRIG 194 (H) 03/24/2018 0508   HDL 35 (L) 03/24/2018 0508   CHOLHDL 3.6 03/24/2018 0508   VLDL 39 03/24/2018 0508   LDLCALC 51 03/24/2018 0508    Additional studies/ records that were reviewed today include:  Chest x-ray reviewed from PCP and showed pulmonary congestion and small pleural effusions 12/08/2018 BNP was 2052 12/10/2018 creatinine 0.92 potassium 4.6 other chemistries normal hemoglobin 10.2 LDL 109    ASSESSMENT:    1. Acute on chronic diastolic CHF (congestive heart failure) ([place])   2. Essential hypertension   3. Medication management   4. Coronary artery disease due to lipid rich plaque   5. PAF (paroxysmal atrial fibrillation) ([place])   6. Chronic obstructive pulmonary disease, unspecified COPD type ([place])      PLAN:  In order of problems listed above:  Acute on chronic diastolic CHF with increase BNP  over 2000 and chest x-ray with pulmonary congestion.  Patient has not responded to increase of Lasix to 40 mg daily.  Will  increase to 40 mg twice daily for 3 days then back down to 40.  2 g sodium diet given.  Check BNP and be met on Friday.  Follow-up with myself or another provider in 2 weeks.  Call if she has no response or improvement.  Essential hypertension blood pressure stable  Medication management with labs as discussed above  CAD with minimal disease on cardiac catheterization 03/2018  PAF on Eliquis hemoglobin was 10.2 last week previously 11.1.  No evidence of bleeding.   Medication Adjustments/Labs and Tests Ordered: Current medicines are reviewed at length with the patient today.  Concerns regarding medicines are outlined above.  Medication changes, Labs and Tests ordered today are listed in the Patient Instructions below. Patient Instructions  Medication Instructions:  Your physician has recommended you make the following change in your medication:  Increase Lasix to 40 mg Two Times Daily for 3 Days then resume Lasix 40 mg Daily.   If you need a refill on your cardiac medications before your next appointment, please call your pharmacy.   Lab work: Your physician recommends that you return for lab work in: Friday 12/16/2018  If you have labs (blood work) drawn today and your tests are completely normal, you will receive your results only by: Marland Kitchen MyChart Message (if you have MyChart) OR . A paper copy in the mail If you have any lab test that is abnormal or we need to change your treatment, we will call you to review the results.  Testing/Procedures: NONE   Follow-Up: At [facility], you and your health needs are our priority.  As part of our continuing mission to provide you with exceptional heart care, we have created designated Provider Care Teams.  These Care Teams include your primary Cardiologist (physician) and Advanced Practice Providers (APPs -  Physician Assistants and Nurse Practitioners) who all work together to provide you with the care you need, when you need it. You will need  a follow up appointment in 2 weeks.  Please call our office 2 months in advance to schedule this appointment.  You may see Thompson Grayer, MD or one of the following Advanced Practice Providers on your designated Care Team:   Bernerd Pho, PA-C [facility]) . Ermalinda Barrios, PA-C ([place])  Any Other Special Instructions Will Be Listed Below (If Applicable). Thank you for choosing [facility]!        Sumner Boast, PA-C  12/12/2018 12:11 PM    [facility] [address], [place], [place]  [address] Phone: [phone]; Fax: [phone]

## 2018-12-12 NOTE — Telephone Encounter (Signed)
Pt called to inform Dr. Domenic Polite that she was seen by her PCP on Thursday for her SOB. They did blood work and a chest xray. They both showed signs of heart failure. Her lasix was increased and she if on oxygen 24/7 now. She was told to call here to inform us if she was not feeling any better, and she is not. Made appointment for Ermalinda Barrios, PA. 12/12/2018

## 2018-12-16 ENCOUNTER — Other Ambulatory Visit: Payer: Self-pay | Admitting: Physician Assistant

## 2018-12-16 ENCOUNTER — Other Ambulatory Visit (HOSPITAL_COMMUNITY)
Admission: RE | Admit: 2018-12-16 | Discharge: 2018-12-16 | Disposition: A | Discharge status: Home / Self Care | Payer: Medicare HMO | Source: Ambulatory Visit | Attending: Physician Assistant | Admitting: Physician Assistant

## 2018-12-16 ENCOUNTER — Other Ambulatory Visit: Payer: Self-pay

## 2018-12-16 DIAGNOSIS — Z79899 Other long term (current) drug therapy: Secondary | ICD-10-CM | POA: Diagnosis present

## 2018-12-16 DIAGNOSIS — I10 Essential (primary) hypertension: Secondary | ICD-10-CM | POA: Diagnosis present

## 2018-12-16 LAB — BASIC METABOLIC PANEL
Anion gap: 17 — ABNORMAL HIGH (ref 5–15)
BUN: 22 mg/dL (ref 8–23)
CALCIUM: 8.7 mg/dL — AB (ref 8.9–10.3)
CO2: 31 mmol/L (ref 22–32)
CREATININE: 1.15 mg/dL — AB (ref 0.44–1.00)
Chloride: 93 mmol/L — ABNORMAL LOW (ref 98–111)
GFR calc Af Amer: 54 mL/min — ABNORMAL LOW (ref >60)
GFR calc non Af Amer: 47 mL/min — ABNORMAL LOW (ref >60)
Glucose, Bld: 165 mg/dL — ABNORMAL HIGH (ref 70–99)
Potassium: 3.1 mmol/L — ABNORMAL LOW (ref 3.5–5.1)
SODIUM: 141 mmol/L (ref 135–145)

## 2018-12-16 LAB — BRAIN NATRIURETIC PEPTIDE: B NATRIURETIC PEPTIDE 5: 271 pg/mL — AB (ref 0.0–100.0)

## 2018-12-16 MED ORDER — POTASSIUM CHLORIDE CRYS ER 10 MEQ PO TBCR: 20.0000 meq | EXTENDED_RELEASE_TABLET | Freq: Two times a day (BID) | ORAL | Status: DC

## 2018-12-19 ENCOUNTER — Telehealth: Payer: Self-pay | Admitting: Physician Assistant

## 2018-12-19 ENCOUNTER — Telehealth: Payer: Self-pay

## 2018-12-19 DIAGNOSIS — Z79899 Other long term (current) drug therapy: Secondary | ICD-10-CM

## 2018-12-19 MED ORDER — POTASSIUM CHLORIDE CRYS ER 20 MEQ PO TBCR: 20.0000 meq | EXTENDED_RELEASE_TABLET | Freq: Two times a day (BID) | ORAL | 3 refills | Status: DC

## 2018-12-19 NOTE — Telephone Encounter (Signed)
Potassium e-scribed to pharmacy as original was placed as clinic administered. Patient has sprained ankle and will not get medication until tomorrow.She will stop by Friday 2/7 to get lab slip to have BMET done

## 2018-12-19 NOTE — Telephone Encounter (Signed)
Pt states M. Bonnell Public called her Friday evening and told her she had called in a Rx for Potassium and that she needed to pick it up. Pt states she's not sure which pharmacy has the Rx, she's not been able to find it at any of the local pharmacies.

## 2018-12-19 NOTE — Telephone Encounter (Signed)
-----   Message from Imogene Burn, PA-C sent at 12/16/2018  6:09 PM EST ----- Potassium low at 3.1. Have called potassium in 20 meq BID. Will need to have bmet Wed. I spoke with patient who says she's feeling better and put a lot of fluid out for the 3 days of increased lasix.

## 2018-12-19 NOTE — Telephone Encounter (Signed)
rx was placed as in clinic administration I have e-scribed to patients pharmacy, Brandi Hunter, patient aware

## 2018-12-21 ENCOUNTER — Other Ambulatory Visit: Payer: Self-pay | Admitting: Nurse Practitioner

## 2018-12-21 DIAGNOSIS — M899 Disorder of bone, unspecified: Secondary | ICD-10-CM

## 2018-12-21 DIAGNOSIS — Z780 Asymptomatic menopausal state: Secondary | ICD-10-CM

## 2018-12-23 ENCOUNTER — Other Ambulatory Visit (HOSPITAL_COMMUNITY)
Admission: RE | Admit: 2018-12-23 | Discharge: 2018-12-23 | Disposition: A | Discharge status: Home / Self Care | Payer: Medicare HMO | Source: Ambulatory Visit | Attending: Physician Assistant | Admitting: Physician Assistant

## 2018-12-23 DIAGNOSIS — Z79899 Other long term (current) drug therapy: Secondary | ICD-10-CM | POA: Insufficient documentation

## 2018-12-23 LAB — BASIC METABOLIC PANEL
Anion gap: 10 (ref 5–15)
BUN: 34 mg/dL — AB (ref 8–23)
CHLORIDE: 104 mmol/L (ref 98–111)
CO2: 28 mmol/L (ref 22–32)
Calcium: 9.3 mg/dL (ref 8.9–10.3)
Creatinine, Ser: 1.05 mg/dL — ABNORMAL HIGH (ref 0.44–1.00)
GFR calc Af Amer: >60 mL/min (ref >60)
GFR, EST NON AFRICAN AMERICAN: 52 mL/min — AB (ref >60)
Glucose, Bld: 133 mg/dL — ABNORMAL HIGH (ref 70–99)
POTASSIUM: 4.6 mmol/L (ref 3.5–5.1)
SODIUM: 142 mmol/L (ref 135–145)

## 2018-12-26 ENCOUNTER — Telehealth: Payer: Self-pay

## 2018-12-26 MED ORDER — POTASSIUM CHLORIDE CRYS ER 20 MEQ PO TBCR: 20.0000 meq | EXTENDED_RELEASE_TABLET | Freq: Every day | ORAL | 3 refills | Status: DC

## 2018-12-26 NOTE — Telephone Encounter (Signed)
Pt notified decrease K+ to daily from BID

## 2018-12-26 NOTE — Telephone Encounter (Signed)
-----   Message from Imogene Burn, PA-C sent at 12/26/2018  7:50 AM EST ----- Potassium normal. Decrease Kdur 30meq once daily. Kidney function better

## 2018-12-27 ENCOUNTER — Ambulatory Visit: Payer: Medicare HMO | Admitting: Student

## 2018-12-27 ENCOUNTER — Encounter: Payer: Self-pay | Admitting: Student

## 2018-12-27 VITALS — BP 126/68 | HR 88 | Ht 67.0 in | Wt 240.0 lb

## 2018-12-27 DIAGNOSIS — I2510 Atherosclerotic heart disease of native coronary artery without angina pectoris: Secondary | ICD-10-CM | POA: Diagnosis not present

## 2018-12-27 DIAGNOSIS — I5032 Chronic diastolic (congestive) heart failure: Secondary | ICD-10-CM

## 2018-12-27 DIAGNOSIS — I10 Essential (primary) hypertension: Secondary | ICD-10-CM

## 2018-12-27 DIAGNOSIS — I441 Atrioventricular block, second degree: Secondary | ICD-10-CM | POA: Diagnosis not present

## 2018-12-27 DIAGNOSIS — I480 Paroxysmal atrial fibrillation: Secondary | ICD-10-CM

## 2018-12-27 DIAGNOSIS — J449 Chronic obstructive pulmonary disease, unspecified: Secondary | ICD-10-CM

## 2018-12-27 DIAGNOSIS — E785 Hyperlipidemia, unspecified: Secondary | ICD-10-CM

## 2018-12-27 DIAGNOSIS — Z79899 Other long term (current) drug therapy: Secondary | ICD-10-CM

## 2018-12-27 NOTE — Progress Notes (Signed)
Cardiology Office Note    Date:  12/27/2018   ID:  Brandi Hunter, DOB November 15, 1941, MRN [id number]  PCP:  Brandi Aly, FNP  Cardiologist: Rozann Lesches, MD   EP: Dr. [name]  Chief Complaint  Patient presents with  . Follow-up    2 week visit    History of Present Illness:    Brandi Hunter is a 78 y.o. 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   Was evaluated by Ermalinda Barrios, PA-C on 12/12/2018 and reported dyspnea on exertion with minimal activity. She had recently been evaluated by her PCP and BNP was elevated to over 2000 and CXR showed pulmonary congestion. Lasix had been been titrated from 20mg  daily to 40 mg daily with minimal improvement in her symptoms, therefore this was further titrated to 40mg  BID for 3 days then back down to 40mg  daily. Labs on 1/31 showed BNP at 271, creatinine 1.15, and K+ 3.1. Repeat BMET on 12/23/2018 showed K+ had improved to 4.6 following dose titration of K-dur and creatinine had improved to 1.05.  In talking with the patient today, she reports that her dyspnea has improved since her last office visit and is now back to baseline. Now taking Lasix 20mg  daily. She was previously using 2 L [place] on a nightly basis but had been instructed by her PCP to use this throughout the day as needed. Saturations are appropriate at 97% on 2L [place] during her office visit today. She has scales at home but has not been monitoring weight daily. This has declined by 2 pounds since her office visit 2 weeks ago. She denies any specific orthopnea, PND, or lower extremity edema. No recent chest pain or palpitations.  She tries to follow a low-sodium diet but does eat at restaurants  intermittently. Had Kem Kays prior to her office visit today.  Past Medical History:  Diagnosis Date  . Asthma   . CAD (coronary artery disease)    a. catheterization in 03/2018 showing 90% ostial OM stenosis with medical management recommended at that time  . Chronic anticoagulation   . COPD (chronic obstructive pulmonary disease) ([place])   . DJD (degenerative joint disease)   . Essential hypertension   . GERD (gastroesophageal reflux disease)   . History of home oxygen therapy   . Hyperlipidemia   . Mobitz (type) II atrioventricular block   . Obesity   . Pacemaker    Implanted by Dr [name] (MDT) 10/06/10  . PAF (paroxysmal atrial fibrillation) ([place])   . Pancreatitis 2010 OR 2011  . Sleep apnea    CPAP  . Type 2 diabetes mellitus ([place])     Past Surgical History:  Procedure Laterality Date  . CATARACT EXTRACTION W/PHACO  11/02/2011   Procedure: CATARACT EXTRACTION PHACO AND INTRAOCULAR LENS PLACEMENT (IOC);  Surgeon: Williams Che;  Location: [facility];  Service: Ophthalmology;  Laterality: Right;  CDE=7.33  . CATARACT EXTRACTION W/PHACO  12/07/2011   Procedure: CATARACT EXTRACTION PHACO AND INTRAOCULAR LENS PLACEMENT (IOC);  Surgeon: Williams Che, MD;  Location: [facility];  Service: Ophthalmology;  Laterality: Left;  CDE 3.61  . COLONOSCOPY WITH PROPOFOL N/A 08/09/2014   Procedure: COLONOSCOPY WITH PROPOFOL;  Surgeon: Juanita Craver, MD;  Location: [facility];  Service: Endoscopy;  Laterality: N/A;  . CYSTOCELE REPAIR    . ESOPHAGOGASTRODUODENOSCOPY (EGD) WITH PROPOFOL N/A 08/09/2014   Procedure: ESOPHAGOGASTRODUODENOSCOPY (EGD) WITH PROPOFOL;  Surgeon: Juanita Craver, MD;  Location: [facility];  Service: Endoscopy;  Laterality: N/A;  . EYE SURGERY  11/01/2012   BOTH EYES CATARACTS  . INSERT / REPLACE / REMOVE PACEMAKER  10/06/10   MDT  implanted by Dr [name]  . KNEE ARTHROSCOPY     both  . LEFT HEART CATH AND CORONARY ANGIOGRAPHY N/A 03/24/2018   Procedure: LEFT HEART CATH  AND CORONARY ANGIOGRAPHY;  Surgeon: Lorretta Harp, MD;  Location: [facility];  Service: Cardiovascular;  Laterality: N/A;  . OVARY SURGERY     removal  . REPAIR RECTOCELE    . SIMPLE MASTECTOMY WITH AXILLARY SENTINEL NODE BIOPSY Left 01/09/2015   Procedure: Irrigation and Drainage Abcess left Axilla;  Surgeon: Jackolyn Confer, MD;  Location: [facility];  Service: General;  Laterality: Left;  . TONSILLECTOMY  AGE 10  . TOTAL ABDOMINAL HYSTERECTOMY  1971    Current Medications: Outpatient Medications Prior to Visit  Medication Sig Dispense Refill  . albuterol (PROAIR HFA) 108 (90 BASE) MCG/ACT inhaler Inhale 2 puffs into the lungs every 4 (four) hours as needed for wheezing.     Marland Kitchen amLODipine (NORVASC) 5 MG tablet Take 1 tablet (5 mg total) by mouth daily. 90 tablet 3  . apixaban (ELIQUIS) 5 MG TABS tablet Take 1 tablet (5 mg total) by mouth 2 (two) times daily. 180 tablet 3  . atorvastatin (LIPITOR) 40 MG tablet Take 40 mg by mouth daily.     . budesonide-formoterol (SYMBICORT) 160-4.5 MCG/ACT inhaler Inhale 2 puffs into the lungs 2 (two) times daily.    . DULoxetine (CYMBALTA) 60 MG capsule Take 60 mg by mouth daily.    . fenofibrate 160 MG tablet Take 160 mg by mouth every evening.    . furosemide (LASIX) 20 MG tablet Take 1 tablet (20 mg total) by mouth daily. 90 tablet 3  . glipiZIDE (GLUCOTROL XL) 10 MG 24 hr tablet Take 20 mg by mouth daily with breakfast.     . ipratropium-albuterol (DUONEB) 0.5-2.5 (3) MG/3ML SOLN Take 3 mLs by nebulization 2 (two) times daily.    . isosorbide mononitrate (IMDUR) 30 MG 24 hr tablet Take 1 tablet (30 mg total) by mouth daily. 30 tablet 11  . LORazepam (ATIVAN) 0.5 MG tablet Take 0.25-0.5 mg by mouth 2 (two) times daily as needed for anxiety.     . metFORMIN (GLUCOPHAGE) 1000 MG tablet Take 1 tablet (1,000 mg total) by mouth 2 (two) times daily with a meal.    . metoprolol succinate (TOPROL-XL) 100 MG 24 hr tablet TAKE 1 TABLET BY MOUTH ONCE DAILY  90 tablet 2  . nitroGLYCERIN (NITROSTAT) 0.4 MG SL tablet Place 1 tablet (0.4 mg total) under the tongue every 5 (five) minutes as needed for chest pain. 25 tablet 3  . oxybutynin (DITROPAN-XL) 10 MG 24 hr tablet Take 10 mg by mouth daily.     . potassium chloride SA (KLOR-CON M20) 20 MEQ tablet Take 1 tablet (20 mEq total) by mouth daily. 90 tablet 3  . Probiotic Product (ALIGN PO) Take 1 capsule by mouth daily.     Marland Kitchen spironolactone (ALDACTONE) 25 MG tablet Take 1 tablet (25 mg total) by mouth daily. 90 tablet 3  . telmisartan (MICARDIS) 40 MG tablet Take 20 mg by mouth daily.    Marland Kitchen  traMADol (ULTRAM) 50 MG tablet Take 1 tablet by mouth every 6 (six) hours as needed for pain.    Marland Kitchen triamcinolone ointment (KENALOG) 0.5 % Apply 1 application topically 2 (two) times daily.     No facility-administered medications prior to visit.      Allergies:   Bee venom and Latex   Social History   Socioeconomic History  . Marital status: Widowed    Spouse name: Not on file  . Number of children: 3  . Years of education: College  . Highest education level: Not on file  Occupational History  . Occupation: Part time Retail buyer: [employer]  . Financial resource strain: Not on file  . Food insecurity:    Worry: Not on file    Inability: Not on file  . Transportation needs:    Medical: Not on file    Non-medical: Not on file  Tobacco Use  . Smoking status: Former Smoker    Packs/day: 0.00    Years: 54.00    Pack years: 0.00    Types: Cigarettes    Last attempt to quit: 09/22/2017    Years since quitting: 1.2  . Smokeless tobacco: Former Systems developer    Quit date: 11/17/2011  Substance and Sexual Activity  . Alcohol use: Not Currently    Alcohol/week: 1.0 standard drinks    Types: 1 Glasses of wine per week    Comment: couple glasses of wine occasionally-once a month  . Drug use: No  . Sexual activity: Not on file  Lifestyle  . Physical activity:    Days per  week: Not on file    Minutes per session: Not on file  . Stress: Not on file  Relationships  . Social connections:    Talks on phone: Not on file    Gets together: Not on file    Attends religious service: Not on file    Active member of club or organization: Not on file    Attends meetings of clubs or organizations: Not on file    Relationship status: Not on file  Other Topics Concern  . Not on file  Social History Narrative   Patient lives at home alone.   Caffeine Use: 16oz drink daily     Family History:  The patient's family history includes Congestive Heart Failure in her father and mother; Diabetes in her brother; Osteoarthritis in her sister; Prostate cancer in her brother.   Review of Systems:   Please see the history of present illness.     General:  No chills, fever, night sweats or weight changes.  Cardiovascular:  No chest pain, edema, orthopnea, palpitations, paroxysmal nocturnal dyspnea. Positive for dyspnea on exertion (now improved).  Dermatological: No rash, lesions/masses Respiratory: No cough, dyspnea Urologic: No hematuria, dysuria Abdominal:   No nausea, vomiting, diarrhea, bright red blood per rectum, melena, or hematemesis Neurologic:  No visual changes, wkns, changes in mental status. All other systems reviewed and are otherwise negative except as noted above.   Physical Exam:    VS:  BP 126/68   Pulse 88   Ht 5\' 7"  (1.702 m)   Wt 240 lb (108.9 kg)   SpO2 97%   BMI 37.59 kg/m    General: Well developed, overweight Caucasian female appearing in no acute distress. Head: Normocephalic, atraumatic, sclera non-icteric, no xanthomas, nares are without discharge.  Neck: No carotid bruits. JVD not elevated.  Lungs: Respirations regular and unlabored, without wheezes  or rales. On 2L [place].  Heart: Regular rate and rhythm with occasional ectopic beats. No S3 or S4.  No murmur, no rubs, or gallops appreciated. Abdomen: Soft, non-tender, non-distended with  normoactive bowel sounds. No hepatomegaly. No rebound/guarding. No obvious abdominal masses. Msk:  Strength and tone appear normal for age. No joint deformities or effusions. Extremities: No clubbing or cyanosis. No lower extremity edema.  Distal pedal pulses are 2+ bilaterally. Neuro: Alert and oriented X 3. Moves all extremities spontaneously. No focal deficits noted. Psych:  Responds to questions appropriately with a normal affect. Skin: No rashes or lesions noted  Wt Readings from Last 3 Encounters:  12/27/18 240 lb (108.9 kg)  12/12/18 242 lb (109.8 kg)  12/07/18 241 lb 9.6 oz (109.6 kg)     Studies/Labs Reviewed:   EKG:  EKG is not ordered today.   Recent Labs: 03/23/2018: TSH 1.508 03/24/2018: Magnesium 1.4 06/30/2018: ALT 14 08/12/2018: Hemoglobin 11.1; Platelets 309 12/16/2018: B Natriuretic Peptide 271.0 12/23/2018: BUN 34; Creatinine, Ser 1.05; Potassium 4.6; Sodium 142   Lipid Panel    Component Value Date/Time   CHOL 125 03/24/2018 0508   TRIG 194 (H) 03/24/2018 0508   HDL 35 (L) 03/24/2018 0508   CHOLHDL 3.6 03/24/2018 0508   VLDL 39 03/24/2018 0508   LDLCALC 51 03/24/2018 0508    Additional studies/ records that were reviewed today include:   Cardiac Catheterization: 03/2018  Ost 2nd Mrg lesion is 90% stenosed.  The left ventricular systolic function is normal.  LV end diastolic pressure is normal.  The left ventricular ejection fraction is 55-65% by visual estimate.   IMPRESSION: Brandi Hunter has a 90% ostial OM inferior subbranch stenosis.  That is her only obstructive lesion in her coronary tree.  She says she has minimal angina.  She has normal LV function.  At this point, I recommend medical therapy.  Should she have recalcitrant symptoms she would be a candidate for [place] atherectomy of the origin of that subbranch.  The radial sheath was removed and a TR band was placed in the right wrist to achieve patent hemostasis.  A femoral angiogram was  performed and a minx closure device was deployed successfully achieving hemostasis.  Patient left the lab in stable condition.  She can restart her Eliquis in the next 48 hours.  Echocardiogram: 07/2018 Study Conclusions  - Left ventricle: The cavity size was normal. Wall thickness was   normal. Systolic function was normal. The estimated ejection   fraction was in the range of 55% to 60%. Wall motion was normal;   there were no regional wall motion abnormalities. Indeterminate   diastolic function. - Aortic valve: Mildly to moderately calcified annulus. Trileaflet. - Mitral valve: Mildly thickened leaflets. There was mild   regurgitation. - Right ventricle: Pacer wire or catheter noted in right ventricle. - Right atrium: Central venous pressure (est): 3 mm Hg. - Atrial septum: No defect or patent foramen ovale was identified. - Tricuspid valve: There was trivial regurgitation. - Pulmonary arteries: Systolic pressure could not be accurately   estimated. - Pericardium, extracardiac: There was no pericardial effusion.  Assessment:    1. Chronic diastolic (congestive) heart failure ([place])   2. Coronary artery disease involving native coronary artery of native heart without angina pectoris   3. PAF (paroxysmal atrial fibrillation) ([place])   4. 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   5. Essential hypertension   6. Hyperlipidemia LDL goal <70   7. Chronic  obstructive pulmonary disease, unspecified COPD type ([place])   8. Medication management      Plan:   In order of problems listed above:  1. Chronic Diastolic CHF - She was recently evaluated for worsening dyspnea on exertion and orthopnea with titration of Lasix dosing as outlined above. She has now resumed her baseline dose of 20 mg daily and reports her breathing is back to normal. Suspect that her episode might have been triggered secondary to dietary indiscretion. We reviewed sodium and fluid  restriction during today's visit. - Will continue on Lasix 20 mg daily for now with instructions to take an extra tablet as needed for weight gain greater than 3 pounds overnight or 5 pounds in 1 week. Was not previously requiring potassium supplementation with this dose in the past given concurrent use of Spironolactone, therefore will recheck a BMET in 3-4 weeks for reassessment of K+ levels.   2. CAD - catheterization in 03/2018 showed 90% ostial OM stenosis with medical management recommended at that time. She denies any recent chest pain and her respiratory status has improved with diuresis. - Continue with medical therapy including statin, Imdur, and beta-blocker therapy. She is not on ASA given the need for anticoagulation.  3. Paroxysmal Atrial Fibrillation - She denies any recent palpitations and heart rate is well controlled in the 80's during today's visit.  Reports HR is in the 60's to 80's when checked at home. Remains on Toprol-XL 100 mg daily for rate control. - Denies any evidence of active bleeding. Continue Eliquis 5 mg twice daily for anticoagulation.  4. 2nd Degree AV Block - s/p PPM placement in 2011. Followed by Dr. [name] and she is due for a remote pacer check in 02/2019. Most recent interrogation in 11/2018 showed normal device function but she had been in persistent atrial fibrillation since 07/2018.  5. HTN - BP is well controlled at 126/68 during today's visit. Continue Amlodipine, Imdur, Toprol-XL, Telmisartan and Spironolactone at current dosing.  6. HLD - Followed by PCP. Goal LDL is less than 70 in the setting of known CAD. She remains on Atorvastatin 40 mg daily.  7. COPD - uses 2L [place] at night and as needed during the day. Saturations are appropriate at 97% on 2L [place] during today's visit.  Medication Adjustments/Labs and Tests Ordered: Current medicines are reviewed at length with the patient today.  Concerns regarding medicines are outlined above.  Medication  changes, Labs and Tests ordered today are listed in the Patient Instructions below. Patient Instructions  Medication Instructions:  Stop K-dur. Only take as needed when taking an extra Lasix.   Labwork: BMET in 3-4 weeks.   Testing/Procedures: None  Follow-Up: 2-3 months with Dr. Domenic Polite  Any Other Special Instructions Will Be Listed Below (If Applicable).  Limit daily fluid intake to less than 2 Liters per day. Please limit salt intake.  Please weight yourself every morning. Take an extra Lasix 20mg  daily along with K-dur 20 mEq daily weight increases by 2-3 pounds overnight or 5 pounds in a single week.   If you need a refill on your cardiac medications before your next appointment, please call your pharmacy.    Signed, Erma Heritage, PA-C  12/27/2018 5:57 PM    [facility] [address] [place], [place] [address] Phone: [phone] Fax: [phone]

## 2018-12-27 NOTE — Patient Instructions (Addendum)
Medication Instructions:  Stop K-dur. Only take as needed when taking an extra Lasix.   Labwork: BMET in 3-4 weeks.   Testing/Procedures: None  Follow-Up: 2-3 months with Dr. Domenic Polite  Any Other Special Instructions Will Be Listed Below (If Applicable).  Limit daily fluid intake to less than 2 Liters per day. Please limit salt intake.  Please weight yourself every morning. Take an extra Lasix 20mg  daily along with K-dur 20 mEq daily weight increases by 2-3 pounds overnight or 5 pounds in a single week.     If you need a refill on your cardiac medications before your next appointment, please call your pharmacy.

## 2018-12-29 ENCOUNTER — Other Ambulatory Visit: Payer: Self-pay | Admitting: Nurse Practitioner

## 2018-12-29 DIAGNOSIS — Z780 Asymptomatic menopausal state: Secondary | ICD-10-CM

## 2018-12-29 DIAGNOSIS — M899 Disorder of bone, unspecified: Secondary | ICD-10-CM

## 2019-01-24 ENCOUNTER — Other Ambulatory Visit (HOSPITAL_COMMUNITY)
Admission: RE | Admit: 2019-01-24 | Discharge: 2019-01-24 | Disposition: A | Discharge status: Home / Self Care | Payer: Medicare HMO | Source: Ambulatory Visit | Attending: Student | Admitting: Student

## 2019-01-24 DIAGNOSIS — Z79899 Other long term (current) drug therapy: Secondary | ICD-10-CM | POA: Insufficient documentation

## 2019-01-24 LAB — BASIC METABOLIC PANEL
ANION GAP: 9 (ref 5–15)
BUN: 26 mg/dL — ABNORMAL HIGH (ref 8–23)
CALCIUM: 9 mg/dL (ref 8.9–10.3)
CO2: 26 mmol/L (ref 22–32)
Chloride: 104 mmol/L (ref 98–111)
Creatinine, Ser: 1.25 mg/dL — ABNORMAL HIGH (ref 0.44–1.00)
GFR calc non Af Amer: 42 mL/min — ABNORMAL LOW (ref >60)
GFR, EST AFRICAN AMERICAN: 49 mL/min — AB (ref >60)
Glucose, Bld: 90 mg/dL (ref 70–99)
Potassium: 4.2 mmol/L (ref 3.5–5.1)
Sodium: 139 mmol/L (ref 135–145)

## 2019-01-27 ENCOUNTER — Other Ambulatory Visit: Payer: Self-pay | Admitting: Nurse Practitioner

## 2019-01-27 DIAGNOSIS — E2839 Other primary ovarian failure: Secondary | ICD-10-CM

## 2019-01-27 DIAGNOSIS — M899 Disorder of bone, unspecified: Secondary | ICD-10-CM

## 2019-01-30 ENCOUNTER — Ambulatory Visit
Admission: RE | Admit: 2019-01-30 | Discharge: 2019-01-30 | Disposition: A | Discharge status: Home / Self Care | Payer: Medicare HMO | Source: Ambulatory Visit | Attending: Nurse Practitioner | Admitting: Nurse Practitioner

## 2019-01-30 ENCOUNTER — Other Ambulatory Visit: Payer: Self-pay

## 2019-01-30 DIAGNOSIS — E2839 Other primary ovarian failure: Secondary | ICD-10-CM

## 2019-01-30 DIAGNOSIS — M899 Disorder of bone, unspecified: Secondary | ICD-10-CM

## 2019-01-30 DIAGNOSIS — Z780 Asymptomatic menopausal state: Secondary | ICD-10-CM

## 2019-03-09 ENCOUNTER — Other Ambulatory Visit: Payer: Self-pay

## 2019-03-09 ENCOUNTER — Ambulatory Visit (INDEPENDENT_AMBULATORY_CARE_PROVIDER_SITE_OTHER): Payer: Medicare HMO | Admitting: *Deleted

## 2019-03-09 DIAGNOSIS — I5032 Chronic diastolic (congestive) heart failure: Secondary | ICD-10-CM

## 2019-03-09 DIAGNOSIS — I441 Atrioventricular block, second degree: Secondary | ICD-10-CM

## 2019-03-10 ENCOUNTER — Telehealth: Payer: Self-pay

## 2019-03-10 LAB — CUP PACEART REMOTE DEVICE CHECK
Battery Impedance: 1061 Ohm
Battery Remaining Longevity: 52 mo
Battery Voltage: 2.76 V
Brady Statistic AP VP Percent: 61 %
Brady Statistic AP VS Percent: 1 %
Brady Statistic AS VP Percent: 7 %
Brady Statistic AS VS Percent: 31 %
Date Time Interrogation Session: 20200424164724
Implantable Lead Implant Date: 20111121
Implantable Lead Implant Date: 20111121
Implantable Lead Location: 753859
Implantable Lead Location: 753860
Implantable Lead Model: 4469
Implantable Lead Model: 4470
Implantable Lead Serial Number: 548226
Implantable Lead Serial Number: 687643
Implantable Pulse Generator Implant Date: 20111121
Lead Channel Impedance Value: 409 Ohm
Lead Channel Impedance Value: 480 Ohm
Lead Channel Pacing Threshold Amplitude: 0.5 V
Lead Channel Pacing Threshold Pulse Width: 0.4 ms
Lead Channel Setting Pacing Amplitude: 2 V
Lead Channel Setting Pacing Amplitude: 2.5 V
Lead Channel Setting Pacing Pulse Width: 0.4 ms
Lead Channel Setting Sensing Sensitivity: 2.8 mV

## 2019-03-10 NOTE — Telephone Encounter (Signed)
Spoke with patient to remind of missed remote transmission 

## 2019-03-17 NOTE — Progress Notes (Signed)
Remote pacemaker transmission.   

## 2019-03-20 ENCOUNTER — Telehealth: Payer: Self-pay | Admitting: Cardiology

## 2019-03-20 NOTE — Telephone Encounter (Signed)
Virtual Visit Pre-Appointment Phone Call  "(Name), I am calling you today to discuss your upcoming appointment. We are currently trying to limit exposure to the virus that causes COVID-19 by seeing patients at home rather than in the office."  "What is the BEST phone number to call the day of the visit?" [phone]  1. Do you have or have access to (through a family member/friend) a smartphone with video capability that we can use for your visit?" a. If yes - list this number in appt notes as cell (if different from BEST phone #) and list the appointment type as a VIDEO visit in appointment notes b. If no - list the appointment type as a PHONE visit in appointment notes  2. Confirm consent - "In the setting of the current Covid19 crisis, you are scheduled for a (phone or video) visit with your provider on (date) at (time).  Just as we do with many in-office visits, in order for you to participate in this visit, we must obtain consent.  If you'd like, I can send this to your mychart (if signed up) or email for you to review.  Otherwise, I can obtain your verbal consent now.  All virtual visits are billed to your insurance company just like a normal visit would be.  By agreeing to a virtual visit, we'd like you to understand that the technology does not allow for your provider to perform an examination, and thus may limit your provider's ability to fully assess your condition. If your provider identifies any concerns that need to be evaluated in person, we will make arrangements to do so.  Finally, though the technology is pretty good, we cannot assure that it will always work on either your or our end, and in the setting of a video visit, we may have to convert it to a phone-only visit.  In either situation, we cannot ensure that we have a secure connection.  Are you willing to proceed?" STAFF: Did the patient verbally acknowledge consent to telehealth visit? Document YES/NO here: YES   3.   4. Advise patient to be prepared - "Two hours prior to your appointment, go ahead and check your blood pressure, pulse, oxygen saturation, and your weight (if you have the equipment to check those) and write them all down. When your visit starts, your provider will ask you for this information. If you have an Apple Watch or Kardia device, please plan to have heart rate information ready on the day of your appointment. Please have a pen and paper handy nearby the day of the visit as well."  5. Give patient instructions for MyChart download to smartphone OR Doximity/Doxy.me as below if video visit (depending on what platform provider is using)  6. Inform patient they will receive a phone call 15 minutes prior to their appointment time (may be from unknown caller ID) so they should be prepared to answer    TELEPHONE CALL NOTE  Amalia Edgecombe Paar has been deemed a candidate for a follow-up tele-health visit to limit community exposure during the Covid-19 pandemic. I spoke with the patient via phone to ensure availability of phone/video source, confirm preferred email & phone number, and discuss instructions and expectations.  I reminded Brandi Hunter to be prepared with any vital sign and/or heart rhythm information that could potentially be obtained via home monitoring, at the time of her visit. I reminded Amire Gossen [place] to expect a phone call prior to her visit.  Lynnda Child Slaughter 03/20/2019 12:20 PM   INSTRUCTIONS FOR DOWNLOADING THE MYCHART APP TO SMARTPHONE  - The patient must first make sure to have activated MyChart and know their login information - If Apple, go to [employer] and type in MyChart in the search bar and download the app. If Android, ask patient to go to [employer] and type in [place] in the search bar and download the app. The app is free but as with any other app downloads, their phone may require them to verify saved payment information or Apple/Android password.  - The  patient will need to then log into the app with their MyChart username and password, and select [facility] as their healthcare provider to link the account. When it is time for your visit, go to the MyChart app, find appointments, and click Begin Video Visit. Be sure to Select Allow for your device to access the Microphone and Camera for your visit. You will then be connected, and your provider will be with you shortly.  **If they have any issues connecting, or need assistance please contact MyChart service desk [phone])**  **If using a computer, in order to ensure the best quality for their visit they will need to use either of the following Internet Browsers: [employer], or Google Chrome**  IF USING DOXIMITY or DOXY.ME - The patient will receive a link just prior to their visit by text.     FULL LENGTH CONSENT FOR TELE-HEALTH VISIT   I hereby voluntarily request, consent and authorize [facility] and its employed or contracted physicians, physician assistants, nurse practitioners or other licensed health care professionals (the Practitioner), to provide me with telemedicine health care services (the Services") as deemed necessary by the treating Practitioner. I acknowledge and consent to receive the Services by the Practitioner via telemedicine. I understand that the telemedicine visit will involve communicating with the Practitioner through live audiovisual communication technology and the disclosure of certain medical information by electronic transmission. I acknowledge that I have been given the opportunity to request an in-person assessment or other available alternative prior to the telemedicine visit and am voluntarily participating in the telemedicine visit.  I understand that I have the right to withhold or withdraw my consent to the use of telemedicine in the course of my care at any time, without affecting my right to future care or treatment, and that the  Practitioner or I may terminate the telemedicine visit at any time. I understand that I have the right to inspect all information obtained and/or recorded in the course of the telemedicine visit and may receive copies of available information for a reasonable fee.  I understand that some of the potential risks of receiving the Services via telemedicine include:   Delay or interruption in medical evaluation due to technological equipment failure or disruption;  Information transmitted may not be sufficient (e.g. poor resolution of images) to allow for appropriate medical decision making by the Practitioner; and/or   In rare instances, security protocols could fail, causing a breach of personal health information.  Furthermore, I acknowledge that it is my responsibility to provide information about my medical history, conditions and care that is complete and accurate to the best of my ability. I acknowledge that Practitioner's advice, recommendations, and/or decision may be based on factors not within their control, such as incomplete or inaccurate data provided by me or distortions of diagnostic images or specimens that may result from electronic transmissions. I understand that the  practice of medicine is not an Chief Strategy Officer and that Practitioner makes no warranties or guarantees regarding treatment outcomes. I acknowledge that I will receive a copy of this consent concurrently upon execution via email to the email address I last provided but may also request a printed copy by calling the office of [facility].    I understand that my insurance will be billed for this visit.   I have read or had this consent read to me.  I understand the contents of this consent, which adequately explains the benefits and risks of the Services being provided via telemedicine.   I have been provided ample opportunity to ask questions regarding this consent and the Services and have had my questions answered to my  satisfaction.  I give my informed consent for the services to be provided through the use of telemedicine in my medical care  By participating in this telemedicine visit I agree to the above.

## 2019-03-24 NOTE — Progress Notes (Signed)
Virtual Visit via Telephone Note   This visit type was conducted due to national recommendations for restrictions regarding the COVID-19 Pandemic (e.g. social distancing) in an effort to limit this patient's exposure and mitigate transmission in our community.  Due to her co-morbid illnesses, this patient is at least at moderate risk for complications without adequate follow up.  This format is felt to be most appropriate for this patient at this time.  The patient did not have access to video technology/had technical difficulties with video requiring transitioning to audio format only (telephone).  All issues noted in this document were discussed and addressed.  No physical exam could be performed with this format.  Please refer to the patient's chart for her  consent to telehealth for [facility].   Date:  03/27/2019   ID:  Brandi Hunter, DOB 1941/03/11, MRN [id number]  Patient Location: Home Provider Location: Home  PCP:  [name], [place]  Cardiologist:  Rozann Lesches, MD Electrophysiologist: Thompson Grayer, MD  Evaluation Performed:  Follow-Up Visit  Chief Complaint:   Cardiac follow-up  History of Present Illness:    Brandi Hunter is a 78 y.o. female last seen by Ms. Strader PA-C in February.  We could not establish video access today and spoke by phone.  She states that since last encounter she has some fluctuations in blood pressure and also intermittent ankle/foot swelling.  She has taken extra Lasix which has helped her leg swelling.  Does not report any change in salt or fluid intake.  She sees Dr. [name] in the device clinic, [employer] pacemaker in place.  She does not report any sense of palpitations, no dizziness or syncope.  I reviewed her medications which are outlined below.  She does not report any bleeding problems on Eliquis.  Lab work from March is outlined below.  The patient does not have symptoms concerning for COVID-19 infection (fever, chills, cough, or new  shortness of breath).  She states that she has been social distancing.  She does go to the grocery store and gets food takeout occasionally.   Past Medical History:  Diagnosis Date  . Asthma   . CAD (coronary artery disease)    a. catheterization in 03/2018 showing 90% ostial OM stenosis with medical management recommended at that time  . Chronic anticoagulation   . COPD (chronic obstructive pulmonary disease) ([place])   . DJD (degenerative joint disease)   . Essential hypertension   . GERD (gastroesophageal reflux disease)   . History of home oxygen therapy   . Hyperlipidemia   . Mobitz (type) II atrioventricular block   . Obesity   . Pacemaker    Implanted by Dr [name] (MDT) 10/06/10  . PAF (paroxysmal atrial fibrillation) ([place])   . Pancreatitis 2010 OR 2011  . Sleep apnea    CPAP  . Type 2 diabetes mellitus ([place])    Past Surgical History:  Procedure Laterality Date  . CATARACT EXTRACTION W/PHACO  11/02/2011   Procedure: CATARACT EXTRACTION PHACO AND INTRAOCULAR LENS PLACEMENT (IOC);  Surgeon: Williams Che;  Location: [facility];  Service: Ophthalmology;  Laterality: Right;  CDE=7.33  . CATARACT EXTRACTION W/PHACO  12/07/2011   Procedure: CATARACT EXTRACTION PHACO AND INTRAOCULAR LENS PLACEMENT (IOC);  Surgeon: Williams Che, MD;  Location: [facility];  Service: Ophthalmology;  Laterality: Left;  CDE 3.61  . COLONOSCOPY WITH PROPOFOL N/A 08/09/2014   Procedure: COLONOSCOPY WITH PROPOFOL;  Surgeon: Juanita Craver, MD;  Location: [facility];  Service: Endoscopy;  Laterality: N/A;  . CYSTOCELE REPAIR    . ESOPHAGOGASTRODUODENOSCOPY (EGD) WITH PROPOFOL N/A 08/09/2014   Procedure: ESOPHAGOGASTRODUODENOSCOPY (EGD) WITH PROPOFOL;  Surgeon: Juanita Craver, MD;  Location: [facility];  Service: Endoscopy;  Laterality: N/A;  . EYE SURGERY  11/01/2012   BOTH EYES CATARACTS  . INSERT / REPLACE / REMOVE PACEMAKER  10/06/10   MDT  implanted by Dr [name]  . KNEE ARTHROSCOPY     both  . LEFT  HEART CATH AND CORONARY ANGIOGRAPHY N/A 03/24/2018   Procedure: LEFT HEART CATH AND CORONARY ANGIOGRAPHY;  Surgeon: Lorretta Harp, MD;  Location: [facility];  Service: Cardiovascular;  Laterality: N/A;  . OVARY SURGERY     removal  . REPAIR RECTOCELE    . SIMPLE MASTECTOMY WITH AXILLARY SENTINEL NODE BIOPSY Left 01/09/2015   Procedure: Irrigation and Drainage Abcess left Axilla;  Surgeon: Jackolyn Confer, MD;  Location: [facility];  Service: General;  Laterality: Left;  . TONSILLECTOMY  AGE 71  . TOTAL ABDOMINAL HYSTERECTOMY  1971     Current Meds  Medication Sig  . albuterol (PROAIR HFA) 108 (90 BASE) MCG/ACT inhaler Inhale 2 puffs into the lungs every 4 (four) hours as needed for wheezing.   Marland Kitchen amLODipine (NORVASC) 5 MG tablet Take 1 tablet (5 mg total) by mouth daily.  Marland Kitchen apixaban (ELIQUIS) 5 MG TABS tablet Take 1 tablet (5 mg total) by mouth 2 (two) times daily.  Marland Kitchen atorvastatin (LIPITOR) 40 MG tablet Take 40 mg by mouth daily.   . budesonide-formoterol (SYMBICORT) 160-4.5 MCG/ACT inhaler Inhale 2 puffs into the lungs 2 (two) times daily.  . fenofibrate 160 MG tablet Take 160 mg by mouth every evening.  . furosemide (LASIX) 20 MG tablet Take 1 tablet (20 mg total) by mouth daily.  Marland Kitchen glipiZIDE (GLUCOTROL XL) 10 MG 24 hr tablet Take 20 mg by mouth daily with breakfast.   . ipratropium-albuterol (DUONEB) 0.5-2.5 (3) MG/3ML SOLN Take 3 mLs by nebulization 2 (two) times daily.  . isosorbide mononitrate (IMDUR) 30 MG 24 hr tablet Take 1 tablet (30 mg total) by mouth daily.  Marland Kitchen LORazepam (ATIVAN) 0.5 MG tablet Take 0.25-0.5 mg by mouth 2 (two) times daily as needed for anxiety.   . metFORMIN (GLUCOPHAGE) 1000 MG tablet Take 1 tablet (1,000 mg total) by mouth 2 (two) times daily with a meal.  . metoprolol succinate (TOPROL-XL) 100 MG 24 hr tablet TAKE 1 TABLET BY MOUTH ONCE DAILY  . nitroGLYCERIN (NITROSTAT) 0.4 MG SL tablet Place 1 tablet (0.4 mg total) under the tongue every 5 (five) minutes  as needed for chest pain.  Marland Kitchen oxybutynin (DITROPAN-XL) 10 MG 24 hr tablet Take 10 mg by mouth daily.   . potassium chloride SA (KLOR-CON M20) 20 MEQ tablet Take 1 tablet (20 mEq total) by mouth daily. (Patient taking differently: Take 20 mEq by mouth daily. Takes only with lasix)  . Probiotic Product (ALIGN PO) Take 1 capsule by mouth daily.   Marland Kitchen spironolactone (ALDACTONE) 25 MG tablet Take 1 tablet (25 mg total) by mouth daily.  Marland Kitchen telmisartan (MICARDIS) 80 MG tablet Take 40 mg by mouth daily. Take 1/2 tablet (40 mg) daily  . triamcinolone ointment (KENALOG) 0.5 % Apply 1 application topically 2 (two) times daily.     Allergies:   Bee venom and Latex   Social History   Tobacco Use  . Smoking status: Former Smoker    Packs/day: 0.00    Years: 54.00    Pack years:  0.00    Types: Cigarettes    Last attempt to quit: 09/22/2017    Years since quitting: 1.5  . Smokeless tobacco: Former Systems developer    Quit date: 11/17/2011  Substance Use Topics  . Alcohol use: Not Currently    Alcohol/week: 1.0 standard drinks    Types: 1 Glasses of wine per week    Comment: couple glasses of wine occasionally-once a month  . Drug use: No     Family Hx: The patient's family history includes Congestive Heart Failure in her father and mother; Diabetes in her brother; Osteoarthritis in her sister; Prostate cancer in her brother. There is no history of Anesthesia problems, Hypotension, Malignant hyperthermia, or Pseudochol deficiency.  ROS:   Please see the history of present illness.    All other systems reviewed and are negative.   Prior CV studies:   The following studies were reviewed today:  Echocardiogram 07/19/2018: Study Conclusions  - Left ventricle: The cavity size was normal. Wall thickness was   normal. Systolic function was normal. The estimated ejection   fraction was in the range of 55% to 60%. Wall motion was normal;   there were no regional wall motion abnormalities. Indeterminate    diastolic function. - Aortic valve: Mildly to moderately calcified annulus. Trileaflet. - Mitral valve: Mildly thickened leaflets. There was mild   regurgitation. - Right ventricle: Pacer wire or catheter noted in right ventricle. - Right atrium: Central venous pressure (est): 3 mm Hg. - Atrial septum: No defect or patent foramen ovale was identified. - Tricuspid valve: There was trivial regurgitation. - Pulmonary arteries: Systolic pressure could not be accurately   estimated. - Pericardium, extracardiac: There was no pericardial effusion.  Labs/Other Tests and Data Reviewed:    EKG:  An ECG dated 08/12/2018 was personally reviewed today and demonstrated:  Ventricular paced rhythm with underlying atrial fibrillation.  Recent Labs: 06/30/2018: ALT 14 08/12/2018: Hemoglobin 11.1; Platelets 309 12/16/2018: B Natriuretic Peptide 271.0 01/24/2019: BUN 26; Creatinine, Ser 1.25; Potassium 4.2; Sodium 139   Recent Lipid Panel Lab Results  Component Value Date/Time   CHOL 125 03/24/2018 05:08 AM   TRIG 194 (H) 03/24/2018 05:08 AM   HDL 35 (L) 03/24/2018 05:08 AM   CHOLHDL 3.6 03/24/2018 05:08 AM   LDLCALC 51 03/24/2018 05:08 AM    Wt Readings from Last 3 Encounters:  03/27/19 235 lb (106.6 kg)  12/27/18 240 lb (108.9 kg)  12/12/18 242 lb (109.8 kg)     Objective:    Vital Signs:  BP (!) 143/85   Pulse 77   Ht 5\' 6"  (1.676 m)   Wt 235 lb (106.6 kg)   SpO2 98%   BMI 37.93 kg/m    Patient spoke in full sentences on the phone, not breathless.  No audible wheezing.  Voice tone and speech pattern were normal.  ASSESSMENT & PLAN:    1.  Persistent atrial fibrillation.  She continues on Eliquis for stroke prophylaxis, also on Toprol-XL.  Reports no obvious palpitations at this time.  Recent lab work reviewed.  2.  Chronic diastolic heart failure, weight is down 5 pounds.  She has occasional ankle and foot swelling and takes extra Lasix with good results.  3.  Essential  hypertension, reportedly fluctuating recently.  Systolic today is in the 242P.  Continue to monitor.  4.  Branch vessel CAD, no obvious angina symptoms.  She is not on aspirin given concurrent use of Eliquis.  Continue statin therapy.  COVID-19 Education: The  signs and symptoms of COVID-19 were discussed with the patient and how to seek care for testing (follow up with PCP or arrange E-visit).  The importance of social distancing was discussed today.  Time:   Today, I have spent 8 minutes with the patient with telehealth technology discussing the above problems.     Medication Adjustments/Labs and Tests Ordered: Current medicines are reviewed at length with the patient today.  Concerns regarding medicines are outlined above.   Tests Ordered: No orders of the defined types were placed in this encounter.   Medication Changes: No orders of the defined types were placed in this encounter.   Disposition:  Follow up 4 months in [place] office.  Signed, Rozann Lesches, MD  03/27/2019 2:40 PM    [place]

## 2019-03-27 ENCOUNTER — Telehealth (INDEPENDENT_AMBULATORY_CARE_PROVIDER_SITE_OTHER): Payer: Medicare HMO | Admitting: Cardiology

## 2019-03-27 ENCOUNTER — Encounter: Payer: Self-pay | Admitting: Cardiology

## 2019-03-27 VITALS — BP 143/85 | HR 77 | Ht 66.0 in | Wt 235.0 lb

## 2019-03-27 DIAGNOSIS — I25119 Atherosclerotic heart disease of native coronary artery with unspecified angina pectoris: Secondary | ICD-10-CM

## 2019-03-27 DIAGNOSIS — I5032 Chronic diastolic (congestive) heart failure: Secondary | ICD-10-CM | POA: Diagnosis not present

## 2019-03-27 DIAGNOSIS — I4819 Other persistent atrial fibrillation: Secondary | ICD-10-CM | POA: Diagnosis not present

## 2019-03-27 DIAGNOSIS — Z7189 Other specified counseling: Secondary | ICD-10-CM

## 2019-03-27 DIAGNOSIS — I110 Hypertensive heart disease with heart failure: Secondary | ICD-10-CM | POA: Diagnosis not present

## 2019-03-27 DIAGNOSIS — I441 Atrioventricular block, second degree: Secondary | ICD-10-CM

## 2019-03-27 DIAGNOSIS — I10 Essential (primary) hypertension: Secondary | ICD-10-CM

## 2019-03-27 NOTE — Patient Instructions (Signed)
Medication Instructions: Your physician recommends that you continue on your current medications as directed. Please refer to the Current Medication list given to you today.   Labwork: None today  Procedures/Testing: None today  Follow-Up: 4 months [place] with Dr.McDowell  Any Additional Special Instructions Will Be Listed Below (If Applicable).   Keep watch of your heart rate and blood pressure  If you need a refill on your cardiac medications before your next appointment, please call your pharmacy.

## 2019-03-28 ENCOUNTER — Other Ambulatory Visit: Payer: Self-pay | Admitting: Cardiology

## 2019-04-21 ENCOUNTER — Telehealth: Payer: Self-pay

## 2019-04-21 NOTE — Telephone Encounter (Signed)
   [facility] Pre-operative Risk Assessment    Request for surgical clearance:  1. What type of surgery is being performed? EPIDURAL STERIOD INJECTION, LUMBAR   2. When is this surgery scheduled? 04/25/2019   3. What type of clearance is required (medical clearance vs. Pharmacy clearance to hold med vs. Both)? BOTH  4. Are there any medications that need to be held prior to surgery and how long? ELIQUIS/ 3 DAYS  5. Practice name and name of physician performing surgery? [facility]  6. What is your office phone number [phone]   7.   What is your office fax number [phone]  8.   Anesthesia type (None, local, MAC, general) ?    Brandi Hunter H Brandi Hunter 04/21/2019, 4:09 PM  _________________________________________________________________   (provider comments below)

## 2019-04-21 NOTE — Telephone Encounter (Signed)
.  Patient with diagnosis of afib on Eliquis for anticoagulation.    Procedure: EPIDURAL STERIOD INJECTION, LUMBAR Date of procedure: 04/25/2019  CHADS2-VASc score of  7 (CHF, HTN, AGE, DM2, stroke/tia x 2, CAD, AGE, female)  CrCl 52ml/min  Patient is high risk off anticoagulation due to CHADS2-VASc score of  7.  Office policy is to hold Eliquis 3 days prior to spinal procedure. Will route to MD for input.

## 2019-04-22 NOTE — Telephone Encounter (Signed)
Agree with your recommendations 

## 2019-04-24 NOTE — Telephone Encounter (Signed)
   Primary Cardiologist: Rozann Lesches, MD  Chart reviewed as part of pre-operative protocol coverage. Given past medical history and time since last visit, based on ACC/AHA guidelines, Brandi Hunter would be at acceptable risk for the planned procedure without further cardiovascular testing.   Patient is high risk off anticoagulation due to CHADS2-VASc score of  7. Office policy is to hold Eliquis 3 days prior to spinal procedure.  I will route this recommendation to the requesting party via Epic fax function and remove from pre-op pool.  Please call with questions.  Lyda Jester, PA-C 04/24/2019, 8:18 AM

## 2019-05-13 IMAGING — DX DG CHEST 2V
2 series · 2 of 2 positions shown · non-contrast
Comparison: 09/26/2017.  09/27/2017.

CLINICAL DATA: Central chest pain and burning since last night.

EXAM:
CHEST - 2 VIEW

[chest pa]
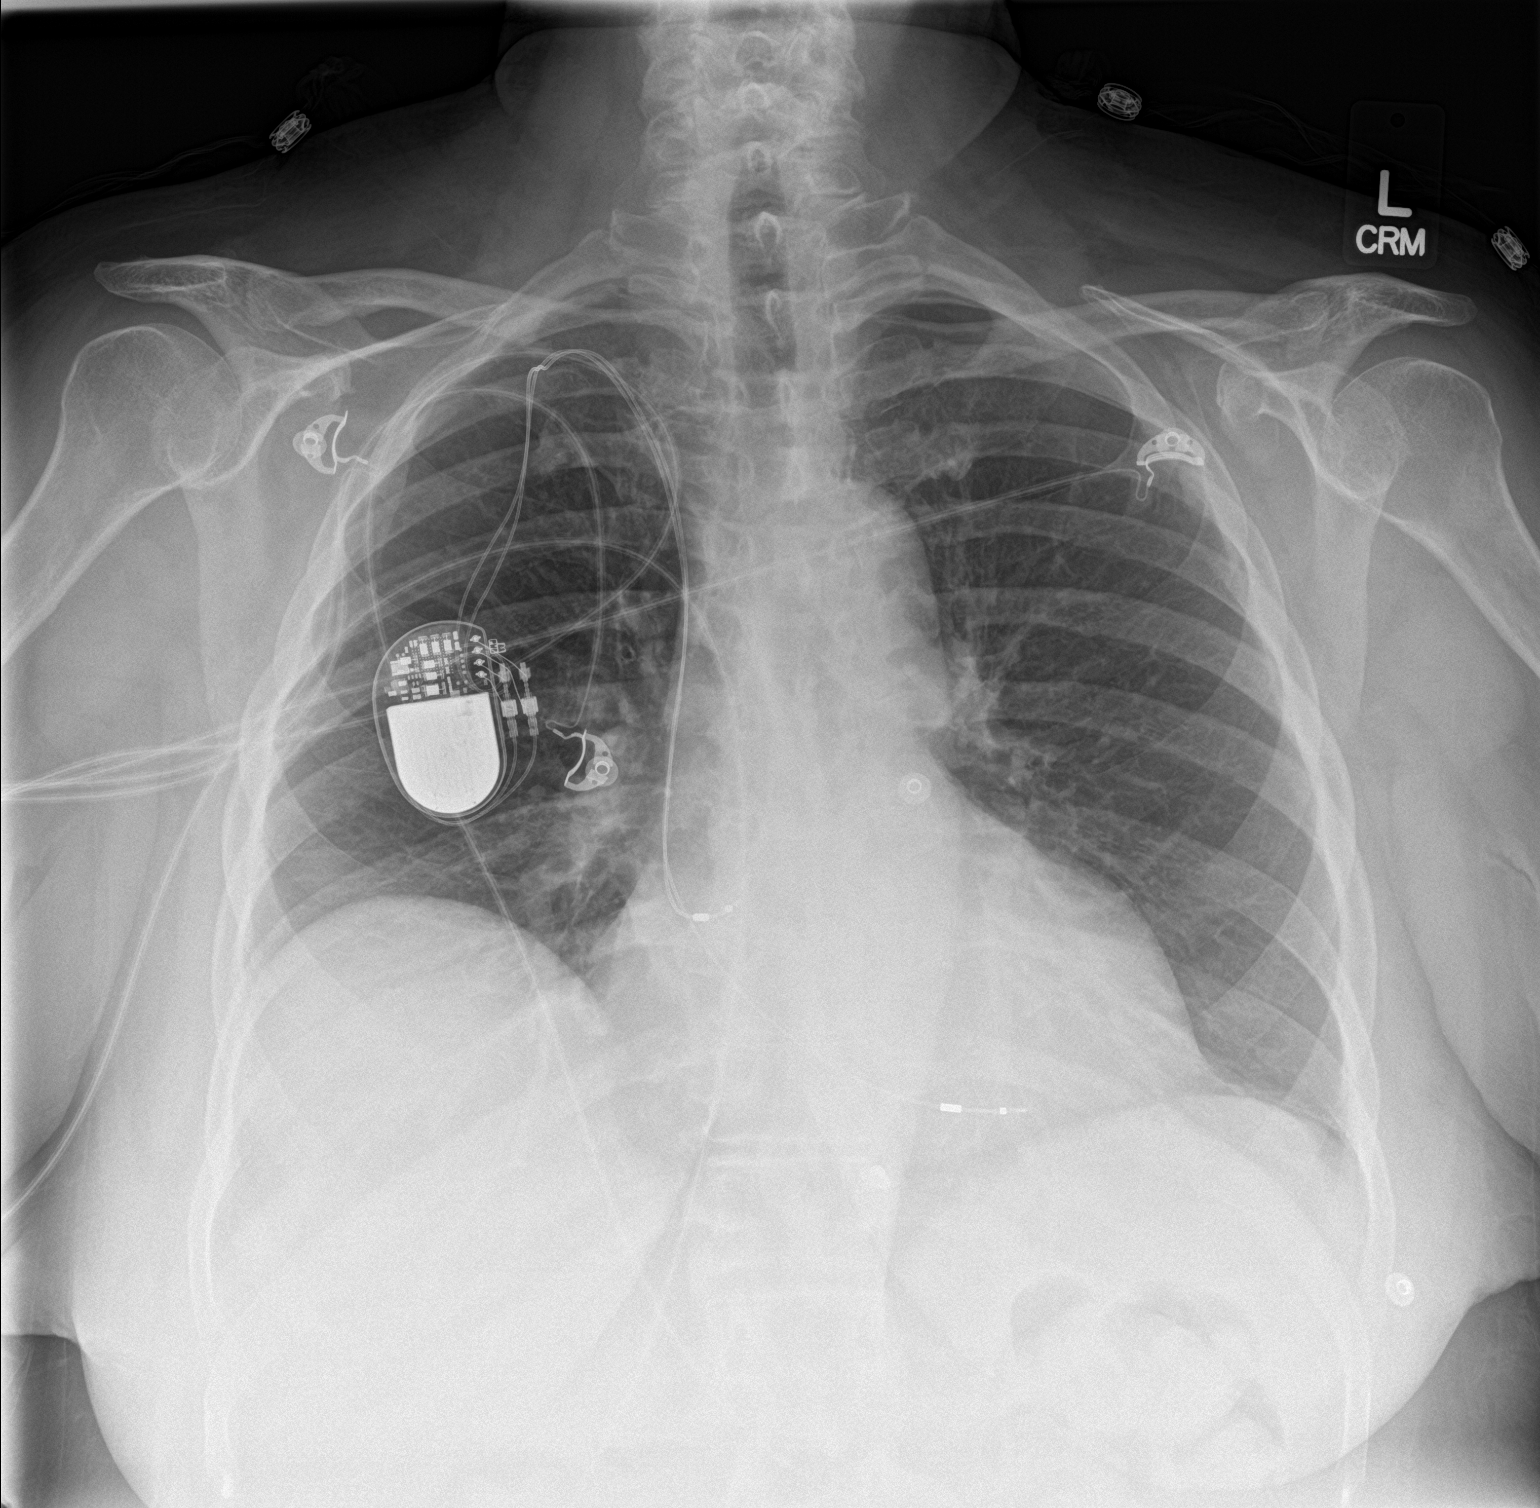

[chest lat]
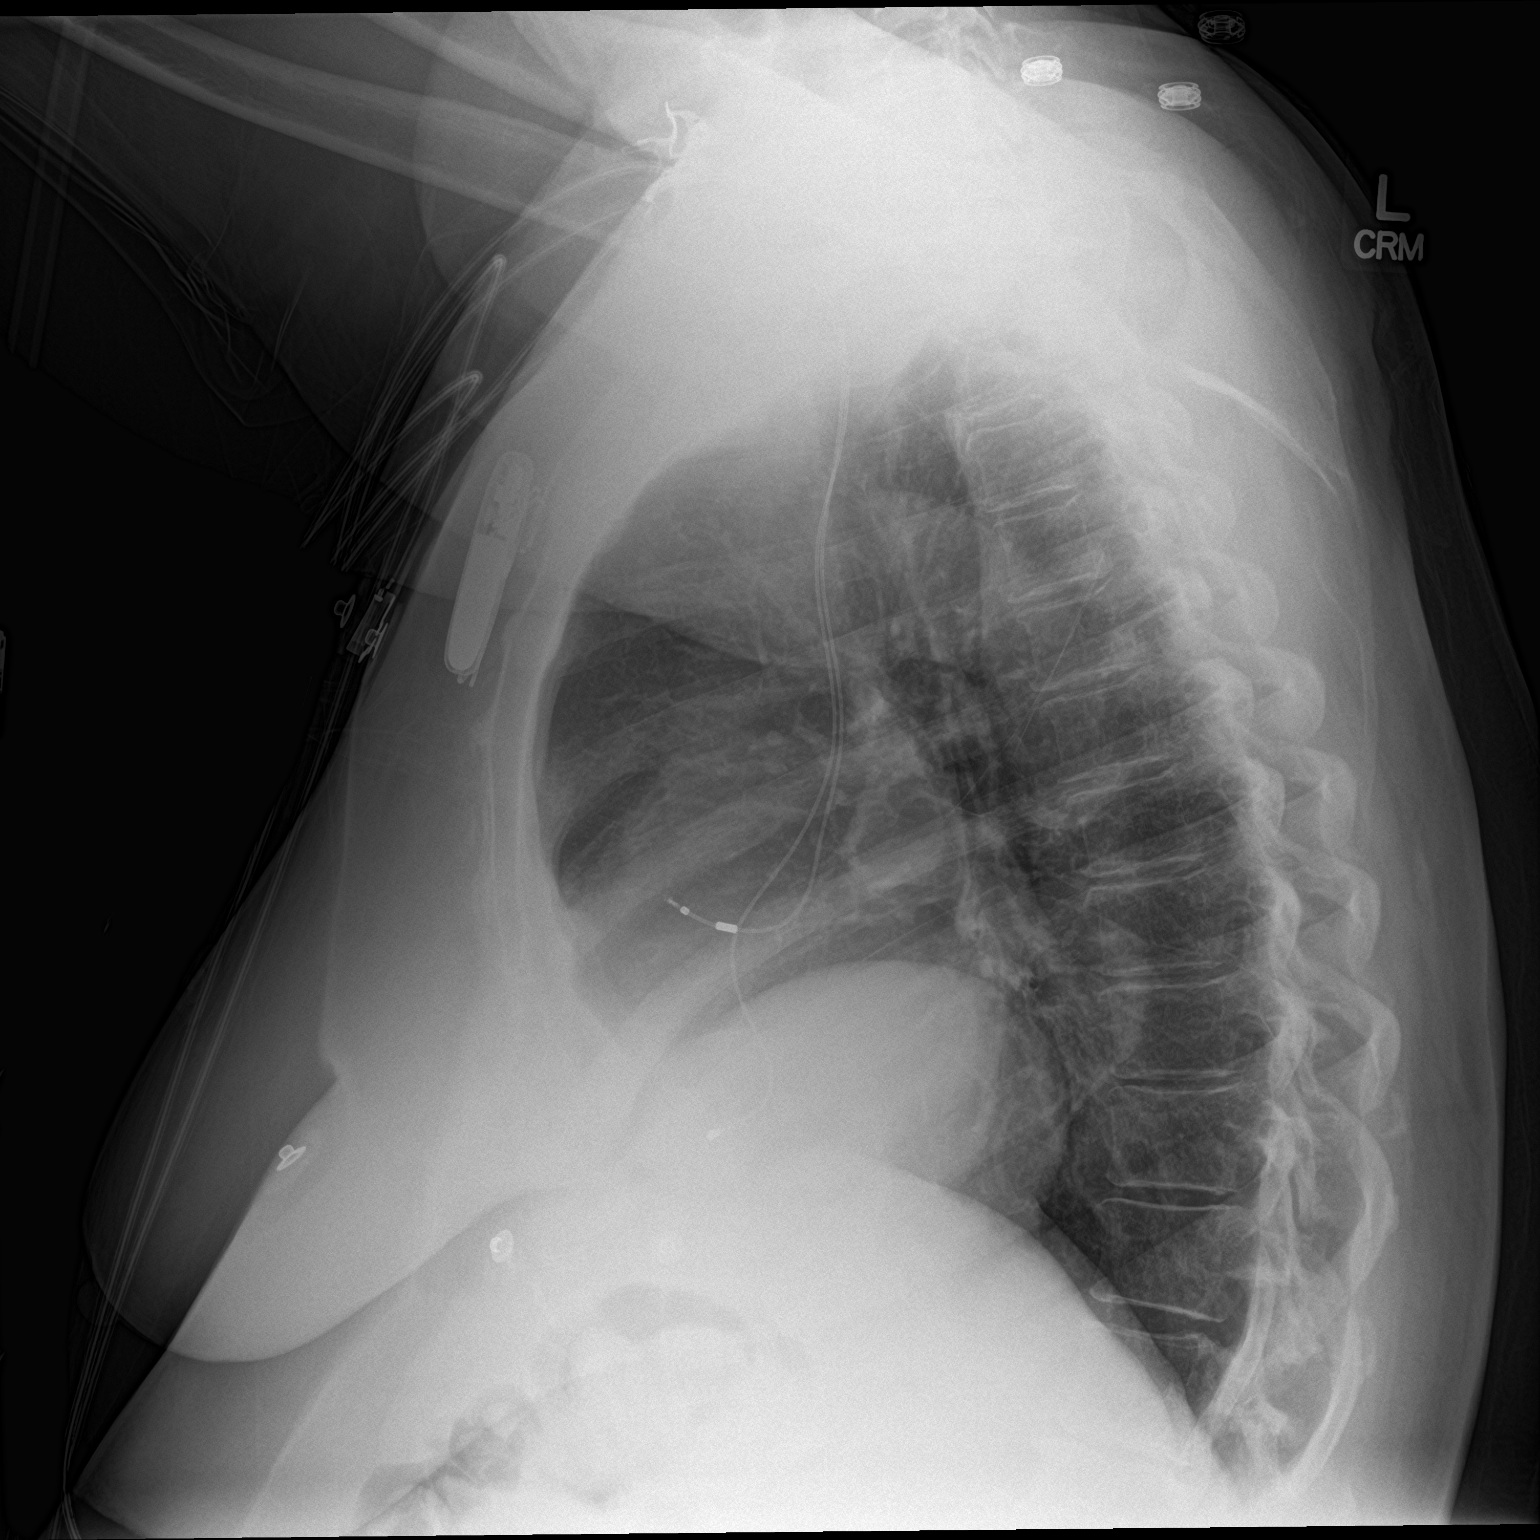

[2 of 2 positions shown; findings below may reference images not displayed]

FINDINGS: Heart size upper limits of normal. Dual lead pacemaker in place with
leads in the region of the right atrium and right ventricle.
Pulmonary vascularity is normal. There is elevation of the right
hemidiaphragm with chronic volume loss in the right lower lung. No
effusions. No acute bone finding.
IMPRESSION: Chronic elevation of the right hemidiaphragm with chronic volume
loss at the right base. No active finding otherwise.

## 2019-06-05 ENCOUNTER — Ambulatory Visit (HOSPITAL_COMMUNITY)
Admission: RE | Admit: 2019-06-05 | Discharge: 2019-06-05 | Disposition: A | Discharge status: Home / Self Care | Payer: Medicare HMO | Source: Ambulatory Visit | Attending: Acute Care | Admitting: Acute Care

## 2019-06-05 ENCOUNTER — Other Ambulatory Visit: Payer: Self-pay

## 2019-06-05 DIAGNOSIS — Z122 Encounter for screening for malignant neoplasm of respiratory organs: Secondary | ICD-10-CM | POA: Diagnosis not present

## 2019-06-05 DIAGNOSIS — Z87891 Personal history of nicotine dependence: Secondary | ICD-10-CM | POA: Diagnosis present

## 2019-06-08 ENCOUNTER — Other Ambulatory Visit: Payer: Self-pay | Admitting: *Deleted

## 2019-06-08 ENCOUNTER — Ambulatory Visit (INDEPENDENT_AMBULATORY_CARE_PROVIDER_SITE_OTHER): Payer: Medicare HMO | Admitting: *Deleted

## 2019-06-08 DIAGNOSIS — I5033 Acute on chronic diastolic (congestive) heart failure: Secondary | ICD-10-CM

## 2019-06-08 DIAGNOSIS — I480 Paroxysmal atrial fibrillation: Secondary | ICD-10-CM

## 2019-06-08 DIAGNOSIS — Z87891 Personal history of nicotine dependence: Secondary | ICD-10-CM

## 2019-06-08 DIAGNOSIS — Z122 Encounter for screening for malignant neoplasm of respiratory organs: Secondary | ICD-10-CM

## 2019-06-09 LAB — CUP PACEART REMOTE DEVICE CHECK
Battery Impedance: 1086 Ohm
Battery Remaining Longevity: 51 mo
Battery Voltage: 2.76 V
Brady Statistic AP VP Percent: 58 %
Brady Statistic AP VS Percent: 1 %
Brady Statistic AS VP Percent: 8 %
Brady Statistic AS VS Percent: 32 %
Date Time Interrogation Session: 20200724003427
Implantable Lead Implant Date: 20111121
Implantable Lead Implant Date: 20111121
Implantable Lead Location: 753859
Implantable Lead Location: 753860
Implantable Lead Model: 4469
Implantable Lead Model: 4470
Implantable Lead Serial Number: 548226
Implantable Lead Serial Number: 687643
Implantable Pulse Generator Implant Date: 20111121
Lead Channel Impedance Value: 426 Ohm
Lead Channel Impedance Value: 501 Ohm
Lead Channel Pacing Threshold Amplitude: 0.5 V
Lead Channel Pacing Threshold Pulse Width: 0.4 ms
Lead Channel Setting Pacing Amplitude: 2 V
Lead Channel Setting Pacing Amplitude: 2.5 V
Lead Channel Setting Pacing Pulse Width: 0.4 ms
Lead Channel Setting Sensing Sensitivity: 4 mV

## 2019-06-19 NOTE — Progress Notes (Signed)
Remote pacemaker transmission.   

## 2019-07-03 ENCOUNTER — Other Ambulatory Visit: Payer: Self-pay | Admitting: Cardiology

## 2019-07-03 ENCOUNTER — Other Ambulatory Visit: Payer: Self-pay | Admitting: Nurse Practitioner

## 2019-07-04 ENCOUNTER — Other Ambulatory Visit: Payer: Self-pay

## 2019-07-04 MED ORDER — NITROGLYCERIN 0.4 MG SL SUBL: SUBLINGUAL_TABLET | SUBLINGUAL | 1 refills | Status: DC

## 2019-07-04 MED ORDER — APIXABAN 5 MG PO TABS: 5.0000 mg | ORAL_TABLET | Freq: Two times a day (BID) | ORAL | 3 refills | Status: DC

## 2019-08-03 ENCOUNTER — Encounter: Payer: Self-pay | Admitting: Cardiology

## 2019-08-03 ENCOUNTER — Ambulatory Visit (INDEPENDENT_AMBULATORY_CARE_PROVIDER_SITE_OTHER): Payer: Medicare HMO | Admitting: Cardiology

## 2019-08-03 ENCOUNTER — Other Ambulatory Visit: Payer: Self-pay

## 2019-08-03 VITALS — BP 156/74 | HR 73 | Temp 96.9°F | Ht 66.0 in | Wt 242.0 lb

## 2019-08-03 DIAGNOSIS — I480 Paroxysmal atrial fibrillation: Secondary | ICD-10-CM

## 2019-08-03 DIAGNOSIS — I25119 Atherosclerotic heart disease of native coronary artery with unspecified angina pectoris: Secondary | ICD-10-CM

## 2019-08-03 DIAGNOSIS — I5032 Chronic diastolic (congestive) heart failure: Secondary | ICD-10-CM

## 2019-08-03 DIAGNOSIS — I10 Essential (primary) hypertension: Secondary | ICD-10-CM | POA: Diagnosis not present

## 2019-08-03 MED ORDER — ISOSORBIDE MONONITRATE ER 30 MG PO TB24: 30.0000 mg | ORAL_TABLET | Freq: Two times a day (BID) | ORAL | 3 refills | Status: DC

## 2019-08-03 MED ORDER — ISOSORBIDE MONONITRATE ER 30 MG PO TB24: 30.0000 mg | ORAL_TABLET | Freq: Two times a day (BID) | ORAL | 11 refills | Status: DC

## 2019-08-03 NOTE — Progress Notes (Signed)
Cardiology Office Note  Date: 08/03/2019   ID: Brandi Hunter, DOB October 29, 1941, MRN [id number]  PCP:  [name], FNP  Cardiologist:  Rozann Lesches, MD Electrophysiologist:  None   Chief Complaint  Patient presents with  . Cardiac follow-up    History of Present Illness: Brandi Hunter is a 78 y.o. female last assessed via telehealth encounter in May.  She presents for a routine visit.  She does not report any palpitations at this time and continues to use nebulizers intermittently for COPD.  She has had intermittent angina symptoms, responsive to a single nitroglycerin.  She reports compliance with her current cardiac regimen which is outlined below.  She sees Dr. [name] in the device clinic, Medtronic pacemaker in place.  Device interrogation in July showed normal function.  We discussed follow-up lab work on [employer].  She states that she had this obtained in July per PCP, we are requesting the results.  She denies any bleeding problems.  Past Medical History:  Diagnosis Date  . Asthma   . CAD (coronary artery disease)    a. catheterization in 03/2018 showing 90% ostial OM stenosis with medical management recommended at that time  . Chronic anticoagulation   . COPD (chronic obstructive pulmonary disease) ([place])   . DJD (degenerative joint disease)   . Essential hypertension   . GERD (gastroesophageal reflux disease)   . History of home oxygen therapy   . Hyperlipidemia   . Mobitz (type) II atrioventricular block   . Obesity   . Pacemaker    Implanted by Dr [name] (MDT) 10/06/10  . PAF (paroxysmal atrial fibrillation) ([place])   . Pancreatitis 2010 OR 2011  . Sleep apnea    CPAP  . Type 2 diabetes mellitus ([place])     Past Surgical History:  Procedure Laterality Date  . CATARACT EXTRACTION W/PHACO  11/02/2011   Procedure: CATARACT EXTRACTION PHACO AND INTRAOCULAR LENS PLACEMENT (IOC);  Surgeon: Williams Che;  [facility]: [facility];  Service: Ophthalmology;  Laterality:  Right;  CDE=7.33  . CATARACT EXTRACTION W/PHACO  12/07/2011   Procedure: CATARACT EXTRACTION PHACO AND INTRAOCULAR LENS PLACEMENT (IOC);  Surgeon: Williams Che, MD;  Location: [facility];  Service: Ophthalmology;  Laterality: Left;  CDE 3.61  . COLONOSCOPY WITH PROPOFOL N/A 08/09/2014   Procedure: COLONOSCOPY WITH PROPOFOL;  Surgeon: Juanita Craver, MD;  [facility];  Service: Endoscopy;  Laterality: N/A;  . CYSTOCELE REPAIR    . ESOPHAGOGASTRODUODENOSCOPY (EGD) WITH PROPOFOL N/A 08/09/2014   Procedure: ESOPHAGOGASTRODUODENOSCOPY (EGD) WITH PROPOFOL;  Surgeon: Juanita Craver, MD;  [facility];  Service: Endoscopy;  Laterality: N/A;  . EYE SURGERY  11/01/2012   BOTH EYES CATARACTS  . INSERT / REPLACE / REMOVE PACEMAKER  10/06/10   MDT  implanted by Dr [name]  . KNEE ARTHROSCOPY     both  . LEFT HEART CATH AND CORONARY ANGIOGRAPHY N/A 03/24/2018   Procedure: LEFT HEART CATH AND CORONARY ANGIOGRAPHY;  Surgeon: Lorretta Harp, MD;  Location: [facility];  Service: Cardiovascular;  Laterality: N/A;  . OVARY SURGERY     removal  . REPAIR RECTOCELE    . SIMPLE MASTECTOMY WITH AXILLARY SENTINEL NODE BIOPSY Left 01/09/2015   Procedure: Irrigation and Drainage Abcess left Axilla;  Surgeon: Jackolyn Confer, MD;  [facility];  Service: General;  Laterality: Left;  . TONSILLECTOMY  AGE 58  . TOTAL ABDOMINAL HYSTERECTOMY  1971    Current Outpatient Medications  Medication Sig Dispense Refill  .  albuterol (PROAIR HFA) 108 (90 BASE) MCG/ACT inhaler Inhale 2 puffs into the lungs every 4 (four) hours as needed for wheezing.     Marland Kitchen amLODipine (NORVASC) 5 MG tablet Take 1 tablet (5 mg total) by mouth daily. 90 tablet 3  . apixaban (ELIQUIS) 5 MG TABS tablet Take 1 tablet (5 mg total) by mouth 2 (two) times daily. 180 tablet 3  . atorvastatin (LIPITOR) 40 MG tablet Take 40 mg by mouth daily.     . budesonide-formoterol (SYMBICORT) 160-4.5 MCG/ACT inhaler Inhale 2 puffs into the  lungs 2 (two) times daily.    . fenofibrate 160 MG tablet Take 160 mg by mouth every evening.    . furosemide (LASIX) 20 MG tablet Take 1 tablet (20 mg total) by mouth daily. 90 tablet 3  . glipiZIDE (GLUCOTROL XL) 10 MG 24 hr tablet Take 20 mg by mouth daily with breakfast.     . ipratropium-albuterol (DUONEB) 0.5-2.5 (3) MG/3ML SOLN Take 3 mLs by nebulization 2 (two) times daily.    . isosorbide mononitrate (IMDUR) 30 MG 24 hr tablet Take 1 tablet (30 mg total) by mouth 2 (two) times daily. 180 tablet 3  . LORazepam (ATIVAN) 0.5 MG tablet Take 0.25-0.5 mg by mouth 2 (two) times daily as needed for anxiety.     . metFORMIN (GLUCOPHAGE) 1000 MG tablet Take 1 tablet (1,000 mg total) by mouth 2 (two) times daily with a meal.    . metoprolol succinate (TOPROL-XL) 100 MG 24 hr tablet TAKE 1 TABLET BY MOUTH ONCE DAILY 90 tablet 2  . nitroGLYCERIN (NITROSTAT) 0.4 MG SL tablet DISSOLVE ONE TABLET UNDER THE TONGUE EVERY 5 MINUTES AS NEEDED FOR CHEST PAIN.  DO NOT EXCEED A TOTAL OF 3 DOSES IN 15 MINUTES 25 tablet 1  . potassium chloride SA (KLOR-CON M20) 20 MEQ tablet Take 1 tablet (20 mEq total) by mouth daily. (Patient taking differently: Take 20 mEq by mouth daily. Takes only with lasix) 90 tablet 3  . Probiotic Product (ALIGN PO) Take 1 capsule by mouth daily.     Marland Kitchen spironolactone (ALDACTONE) 25 MG tablet Take 1 tablet (25 mg total) by mouth daily. 90 tablet 3  . telmisartan (MICARDIS) 80 MG tablet Take 40 mg by mouth daily. Take 1/2 tablet (40 mg) daily    . triamcinolone ointment (KENALOG) 0.5 % Apply 1 application topically 2 (two) times daily.     No current facility-administered medications for this visit.    Allergies:  Bee venom and Latex   Social History: The patient  reports that she quit smoking about 22 months ago. Her smoking use included cigarettes. She smoked 0.00 packs per day for 54.00 years. She quit smokeless tobacco use about 7 years ago. She reports previous alcohol use of about  1.0 standard drinks of alcohol per week. She reports that she does not use drugs.   ROS:  Please see the history of present illness. Otherwise, complete review of systems is positive for none.  All other systems are reviewed and negative.   Physical Exam: VS:  BP (!) 156/74   Pulse 73   Temp (!) 96.9 F (36.1 C)   Ht 5\' 6"  (1.676 m)   Wt 242 lb (109.8 kg)   BMI 39.06 kg/m , BMI Body mass index is 39.06 kg/m.  Wt Readings from Last 3 Encounters:  08/03/19 242 lb (109.8 kg)  03/27/19 235 lb (106.6 kg)  12/27/18 240 lb (108.9 kg)    General: Elderly woman,  appears comfortable at rest. HEENT: Conjunctiva and lids normal, wearing a mask. Neck: Supple, no elevated JVP or carotid bruits, no thyromegaly. Lungs: No active wheezing, nonlabored breathing at rest. Cardiac: Irregular, no S3, soft systolic murmur. Abdomen: Soft, nontender, bowel sounds present. Extremities: Stable mild ankle edema, distal pulses 2+. Skin: Warm and dry. Musculoskeletal: No kyphosis. Neuropsychiatric: Alert and oriented x3, affect grossly appropriate.  ECG:  An ECG dated 08/12/2018 was personally reviewed today and demonstrated:  Ventricular paced rhythm with underlying atrial fibrillation.  Recent Labwork: 08/12/2018: Hemoglobin 11.1; Platelets 309 12/16/2018: B Natriuretic Peptide 271.0 01/24/2019: BUN 26; Creatinine, Ser 1.25; Potassium 4.2; Sodium 139     Component Value Date/Time   CHOL 125 03/24/2018 0508   TRIG 194 (H) 03/24/2018 0508   HDL 35 (L) 03/24/2018 0508   CHOLHDL 3.6 03/24/2018 0508   VLDL 39 03/24/2018 0508   LDLCALC 51 03/24/2018 0508    Other Studies Reviewed Today:  Echocardiogram 07/19/2018: Study Conclusions  - Left ventricle: The cavity size was normal. Wall thickness was normal. Systolic function was normal. The estimated ejection fraction was in the range of 55% to 60%. Wall motion was normal; there were no regional wall motion abnormalities. Indeterminate  diastolic function. - Aortic valve: Mildly to moderately calcified annulus. Trileaflet. - Mitral valve: Mildly thickened leaflets. There was mild regurgitation. - Right ventricle: Pacer wire or catheter noted in right ventricle. - Right atrium: Central venous pressure (est): 3 mm Hg. - Atrial septum: No defect or patent foramen ovale was identified. - Tricuspid valve: There was trivial regurgitation. - Pulmonary arteries: Systolic pressure could not be accurately estimated. - Pericardium, extracardiac: There was no pericardial effusion.  Assessment and Plan:  1.  Paroxysmal atrial fibrillation.  She reports no active palpitations and continues on Toprol-XL.  No reported bleeding problems on Eliquis.  Requesting interval lab work from PCP.  2.  Branch vessel CAD involving the OM, being managed medically following cardiac catheterization from May of last year.  She does report intermittent angina symptoms on current regimen, increase Imdur to 30 mg twice daily.  3.  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  She continues to follow with Dr. [name].  Device function normal by last interrogation.  4.  COPD, continues to follow with PCP.  Medication Adjustments/Labs and Tests Ordered: Current medicines are reviewed at length with the patient today.  Concerns regarding medicines are outlined above.   Tests Ordered: No orders of the defined types were placed in this encounter.   Medication Changes: Meds ordered this encounter  Medications  . DISCONTD: isosorbide mononitrate (IMDUR) 30 MG 24 hr tablet    Sig: Take 1 tablet (30 mg total) by mouth 2 (two) times daily.    Dispense:  60 tablet    Refill:  11    9/17 dose increased to bid  . isosorbide mononitrate (IMDUR) 30 MG 24 hr tablet    Sig: Take 1 tablet (30 mg total) by mouth 2 (two) times daily.    Dispense:  180 tablet    Refill:  3    9/17 dose increased to bid    Disposition:  Follow up 6 months in  the [place] office.  Signed, Satira Sark, MD, [facility] 08/03/2019 10:34 AM    [facility]. [address], [place], [place] [address] Phone: [phone]; Fax: [phone]

## 2019-08-03 NOTE — Patient Instructions (Signed)
Medication Instructions: INCREASE Imdur to 30 mg twice a day  Labwork: None  Procedures/Testing: None today  Follow-Up:  6 months with Dr.McDowell  Any Additional Special Instructions Will Be Listed Below (If Applicable).     If you need a refill on your cardiac medications before your next appointment, please call your pharmacy.     Thank you for choosing [facility] !

## 2019-08-11 ENCOUNTER — Telehealth: Payer: Self-pay

## 2019-08-11 NOTE — Telephone Encounter (Signed)
Spoke with pt regarding appt on 08/14/19. Pt stated she does not have access to MyChart and would like Dr [name] to call her on the phone. Pt stated she will check her vitals prior to her appt. Pt questions were address.

## 2019-08-14 ENCOUNTER — Telehealth (INDEPENDENT_AMBULATORY_CARE_PROVIDER_SITE_OTHER): Payer: Medicare HMO | Admitting: Internal Medicine

## 2019-08-14 ENCOUNTER — Telehealth: Payer: Self-pay | Admitting: Internal Medicine

## 2019-08-14 ENCOUNTER — Encounter: Payer: Self-pay | Admitting: Internal Medicine

## 2019-08-14 ENCOUNTER — Other Ambulatory Visit: Payer: Self-pay

## 2019-08-14 VITALS — BP 149/87 | HR 87 | Ht 66.0 in | Wt 231.0 lb

## 2019-08-14 DIAGNOSIS — I25119 Atherosclerotic heart disease of native coronary artery with unspecified angina pectoris: Secondary | ICD-10-CM

## 2019-08-14 DIAGNOSIS — I5032 Chronic diastolic (congestive) heart failure: Secondary | ICD-10-CM | POA: Diagnosis not present

## 2019-08-14 DIAGNOSIS — I4819 Other persistent atrial fibrillation: Secondary | ICD-10-CM | POA: Diagnosis not present

## 2019-08-14 DIAGNOSIS — I441 Atrioventricular block, second degree: Secondary | ICD-10-CM | POA: Diagnosis not present

## 2019-08-14 DIAGNOSIS — I4891 Unspecified atrial fibrillation: Secondary | ICD-10-CM

## 2019-08-14 NOTE — Progress Notes (Signed)
Electrophysiology TeleHealth Note  Due to national recommendations of social distancing due to [place] 19, an audio telehealth visit is felt to be most appropriate for this patient at this time.  Verbal consent was obtained by me for the telehealth visit today.  The patient does not have capability for a virtual visit.  A phone visit is therefore required today.   Date:  08/14/2019   ID:  Brandi Hunter, DOB [birth year], MRN [id number]  Location: patient's home  Provider location:  [facility]  Evaluation Performed: Follow-up visit  PCP:  [name], FNP   Electrophysiologist:  Dr [name]  Chief Complaint:  palpitations  History of Present Illness:    Brandi Hunter is a 78 y.o. female who presents via telehealth conferencing today.  Since last being seen in our clinic, the patient reports doing reasonably well.  She has fatigue.  States "I just dont feel like doing anything".  She has been persistently in afib for at least 6 months.  Today, she denies symptoms of palpitations, chest pain, shortness of breath,  lower extremity edema, dizziness, presyncope, or syncope.  The patient is otherwise without complaint today.  The patient denies symptoms of fevers, chills, cough, or new SOB worrisome for COVID 19.  Past Medical History:  Diagnosis Date  . Asthma   . CAD (coronary artery disease)    a. catheterization in 03/2018 showing 90% ostial OM stenosis with medical management recommended at that time  . Chronic anticoagulation   . COPD (chronic obstructive pulmonary disease) ([place])   . DJD (degenerative joint disease)   . Essential hypertension   . GERD (gastroesophageal reflux disease)   . History of home oxygen therapy   . Hyperlipidemia   . Mobitz (type) II atrioventricular block   . Obesity   . Pacemaker    Implanted by Dr [name] (MDT) 10/06/10  . PAF (paroxysmal atrial fibrillation) ([place])   . Pancreatitis 2010 OR 2011  . Sleep apnea    CPAP  . Type 2 diabetes mellitus  ([place])     Past Surgical History:  Procedure Laterality Date  . CATARACT EXTRACTION W/PHACO  11/02/2011   Procedure: CATARACT EXTRACTION PHACO AND INTRAOCULAR LENS PLACEMENT (IOC);  Surgeon: Williams Che;  Location: [facility];  Service: Ophthalmology;  Laterality: Right;  CDE=7.33  . CATARACT EXTRACTION W/PHACO  12/07/2011   Procedure: CATARACT EXTRACTION PHACO AND INTRAOCULAR LENS PLACEMENT (IOC);  Surgeon: Williams Che, MD;  Location: [facility];  Service: Ophthalmology;  Laterality: Left;  CDE 3.61  . COLONOSCOPY WITH PROPOFOL N/A 08/09/2014   Procedure: COLONOSCOPY WITH PROPOFOL;  Surgeon: Juanita Craver, MD;  [facility];  Service: Endoscopy;  Laterality: N/A;  . CYSTOCELE REPAIR    . ESOPHAGOGASTRODUODENOSCOPY (EGD) WITH PROPOFOL N/A 08/09/2014   Procedure: ESOPHAGOGASTRODUODENOSCOPY (EGD) WITH PROPOFOL;  Surgeon: Juanita Craver, MD;  [facility];  Service: Endoscopy;  Laterality: N/A;  . EYE SURGERY  11/01/2012   BOTH EYES CATARACTS  . INSERT / REPLACE / REMOVE PACEMAKER  10/06/10   MDT  implanted by Dr [name]  . KNEE ARTHROSCOPY     both  . LEFT HEART CATH AND CORONARY ANGIOGRAPHY N/A 03/24/2018   Procedure: LEFT HEART CATH AND CORONARY ANGIOGRAPHY;  Surgeon: Lorretta Harp, MD;  Location: [facility];  Service: Cardiovascular;  Laterality: N/A;  . OVARY SURGERY     removal  . REPAIR RECTOCELE    . SIMPLE MASTECTOMY WITH AXILLARY SENTINEL NODE BIOPSY Left  01/09/2015   Procedure: Irrigation and Drainage Abcess left Axilla;  Surgeon: Jackolyn Confer, MD;  [facility];  Service: General;  Laterality: Left;  . TONSILLECTOMY  AGE 68  . TOTAL ABDOMINAL HYSTERECTOMY  1971    Current Outpatient Medications  Medication Sig Dispense Refill  . albuterol (PROAIR HFA) 108 (90 BASE) MCG/ACT inhaler Inhale 2 puffs into the lungs every 4 (four) hours as needed for wheezing.     Marland Kitchen apixaban (ELIQUIS) 5 MG TABS tablet Take 1 tablet (5 mg total) by mouth 2 (two) times  daily. 180 tablet 3  . atorvastatin (LIPITOR) 40 MG tablet Take 40 mg by mouth daily.     . budesonide-formoterol (SYMBICORT) 160-4.5 MCG/ACT inhaler Inhale 2 puffs into the lungs 2 (two) times daily.    . fenofibrate 160 MG tablet Take 160 mg by mouth every evening.    . furosemide (LASIX) 20 MG tablet Take 1 tablet (20 mg total) by mouth daily. 90 tablet 3  . glipiZIDE (GLUCOTROL XL) 10 MG 24 hr tablet Take 20 mg by mouth daily with breakfast.     . ipratropium-albuterol (DUONEB) 0.5-2.5 (3) MG/3ML SOLN Take 3 mLs by nebulization 2 (two) times daily.    . isosorbide mononitrate (IMDUR) 30 MG 24 hr tablet Take 1 tablet (30 mg total) by mouth 2 (two) times daily. 180 tablet 3  . LORazepam (ATIVAN) 0.5 MG tablet Take 0.25-0.5 mg by mouth 2 (two) times daily as needed for anxiety.     . metFORMIN (GLUCOPHAGE) 1000 MG tablet Take 1 tablet (1,000 mg total) by mouth 2 (two) times daily with a meal.    . metoprolol succinate (TOPROL-XL) 100 MG 24 hr tablet TAKE 1 TABLET BY MOUTH ONCE DAILY 90 tablet 2  . nitroGLYCERIN (NITROSTAT) 0.4 MG SL tablet DISSOLVE ONE TABLET UNDER THE TONGUE EVERY 5 MINUTES AS NEEDED FOR CHEST PAIN.  DO NOT EXCEED A TOTAL OF 3 DOSES IN 15 MINUTES 25 tablet 1  . Probiotic Product (ALIGN PO) Take 1 capsule by mouth daily.     Marland Kitchen spironolactone (ALDACTONE) 25 MG tablet Take 1 tablet (25 mg total) by mouth daily. 90 tablet 3  . telmisartan (MICARDIS) 80 MG tablet Take 40 mg by mouth daily. Take 1/2 tablet (40 mg) daily    . triamcinolone ointment (KENALOG) 0.5 % Apply 1 application topically 2 (two) times daily.    Marland Kitchen amLODipine (NORVASC) 5 MG tablet Take 1 tablet (5 mg total) by mouth daily. 90 tablet 3  . potassium chloride SA (KLOR-CON M20) 20 MEQ tablet Take 1 tablet (20 mEq total) by mouth daily. (Patient taking differently: Take 20 mEq by mouth daily. Takes only with lasix) 90 tablet 3   No current facility-administered medications for this visit.     Allergies:   Bee venom  and Latex   Social History:  The patient  reports that she quit smoking about 22 months ago. Her smoking use included cigarettes. She smoked 0.00 packs per day for 54.00 years. She quit smokeless tobacco use about 7 years ago. She reports previous alcohol use of about 1.0 standard drinks of alcohol per week. She reports that she does not use drugs.   Family History:  The patient's family history includes Congestive Heart Failure in her father and mother; Diabetes in her brother; Osteoarthritis in her sister; Prostate cancer in her brother.   ROS:  Please see the history of present illness.   All other systems are personally reviewed and negative.  Exam:    Vital Signs:  BP (!) 149/87   Pulse 87   Ht 5\' 6"  (1.676 m)   Wt 231 lb (104.8 kg)   SpO2 98%   BMI 37.28 kg/m   Well sounding, alert and conversant  Labs/Other Tests and Data Reviewed:    Recent Labs: 12/16/2018: B Natriuretic Peptide 271.0 01/24/2019: BUN 26; Creatinine, Ser 1.25; Potassium 4.2; Sodium 139   Wt Readings from Last 3 Encounters:  08/14/19 231 lb (104.8 kg)  08/03/19 242 lb (109.8 kg)  03/27/19 235 lb (106.6 kg)     Last device remote is reviewed from [facility] PDF which reveals normal device function, no arrhythmias   ASSESSMENT & PLAN:    1.  Symptomatic second degree AV block Remotes are uptodate Normal device function  2. afib Previously paroxysmal, now persistent Burden has increased to 100% (0.1% last visit) She is on eliquis Rate controlled due to AV block Risks of cardioversion were discussed with the patient.  She understands risks and wishes to proceed.  Hopefully her fatigue will improve with sinus. If she does not maintain sinus, we will need to consider AAD therapy vs ablation.  3. CAD Followed by Dr Domenic Polite No ischemic symptoms  4. HTN Stable No change required today  5. Tobacco Quit 2018!  6. Chronic diastolic dysfunction stable   Follow-up:  AF clinic 2 weeks post  cardioversion Me in 3 months   Patient Risk:  after full review of this patients clinical status, I feel that they are at moderate risk at this time.  Today, I have spent 15 minutes with the patient with telehealth technology discussing arrhythmia management .    Army Fossa, MD  08/14/2019 10:02 AM     [facility] [address] [place] [address] [phone] (office) [phone] (fax)

## 2019-08-14 NOTE — Telephone Encounter (Signed)
Pt was called and scheduled for a DCCV with follow up appts.

## 2019-08-14 NOTE — Telephone Encounter (Signed)
New Message  Patient states that she has already had her virtual visit with Dr. [name], but has just missed a call on her cell phone. No message in the chart to state what the call was for. Please give patient a call back if patient was called. Patient is requesting the call back to her home phone.

## 2019-08-15 ENCOUNTER — Other Ambulatory Visit: Payer: Medicare HMO

## 2019-08-15 ENCOUNTER — Other Ambulatory Visit (HOSPITAL_COMMUNITY)
Admission: RE | Admit: 2019-08-15 | Discharge: 2019-08-15 | Disposition: A | Discharge status: Home / Self Care | Payer: Medicare HMO | Source: Ambulatory Visit | Attending: Cardiology | Admitting: Cardiology

## 2019-08-15 ENCOUNTER — Other Ambulatory Visit: Payer: Self-pay

## 2019-08-15 DIAGNOSIS — I4891 Unspecified atrial fibrillation: Secondary | ICD-10-CM

## 2019-08-15 DIAGNOSIS — Z01812 Encounter for preprocedural laboratory examination: Secondary | ICD-10-CM | POA: Diagnosis present

## 2019-08-15 DIAGNOSIS — Z20828 Contact with and (suspected) exposure to other viral communicable diseases: Secondary | ICD-10-CM | POA: Insufficient documentation

## 2019-08-15 LAB — BASIC METABOLIC PANEL
BUN/Creatinine Ratio: 24 (ref 12–28)
BUN: 36 mg/dL — ABNORMAL HIGH (ref 8–27)
CO2: 23 mmol/L (ref 20–29)
Calcium: 9.8 mg/dL (ref 8.7–10.3)
Chloride: 105 mmol/L (ref 96–106)
Creatinine, Ser: 1.51 mg/dL — ABNORMAL HIGH (ref 0.57–1.00)
GFR calc Af Amer: 38 mL/min/{1.73_m2} — ABNORMAL LOW (ref >59)
GFR calc non Af Amer: 33 mL/min/{1.73_m2} — ABNORMAL LOW (ref >59)
Glucose: 78 mg/dL (ref 65–99)
Potassium: 4 mmol/L (ref 3.5–5.2)
Sodium: 144 mmol/L (ref 134–144)

## 2019-08-15 LAB — CBC
Hematocrit: 32.7 % — ABNORMAL LOW (ref 34.0–46.6)
Hemoglobin: 10.7 g/dL — ABNORMAL LOW (ref 11.1–15.9)
MCH: 28.6 pg (ref 26.6–33.0)
MCHC: 32.7 g/dL (ref 31.5–35.7)
MCV: 87 fL (ref 79–97)
Platelets: 311 10*3/uL (ref 150–450)
RBC: 3.74 x10E6/uL — ABNORMAL LOW (ref 3.77–5.28)
RDW: 13.2 % (ref 11.7–15.4)
WBC: 7.2 10*3/uL (ref 3.4–10.8)

## 2019-08-16 LAB — NOVEL CORONAVIRUS, NAA (HOSP ORDER, SEND-OUT TO REF LAB; TAT 18-24 HRS): SARS-CoV-2, NAA: NOT DETECTED

## 2019-08-17 NOTE — Progress Notes (Signed)
Called patient for pre op Covid screening. Patient has been in quarantine, will be bringing current copy of living will. All questions answered.

## 2019-08-18 ENCOUNTER — Other Ambulatory Visit: Payer: Self-pay

## 2019-08-18 ENCOUNTER — Ambulatory Visit (HOSPITAL_COMMUNITY): Payer: Medicare HMO | Admitting: Certified Registered Nurse Anesthetist

## 2019-08-18 ENCOUNTER — Encounter (HOSPITAL_COMMUNITY): Payer: Self-pay

## 2019-08-18 ENCOUNTER — Ambulatory Visit (HOSPITAL_COMMUNITY)
Admission: RE | Admit: 2019-08-18 | Discharge: 2019-08-18 | Disposition: A | Discharge status: Home / Self Care | Payer: Medicare HMO | Attending: Cardiology | Admitting: Cardiology

## 2019-08-18 ENCOUNTER — Encounter (HOSPITAL_COMMUNITY)
Admission: RE | Disposition: A | Discharge status: Home / Self Care | Payer: Self-pay | Source: Home / Self Care | Attending: Cardiology

## 2019-08-18 DIAGNOSIS — I441 Atrioventricular block, second degree: Secondary | ICD-10-CM | POA: Insufficient documentation

## 2019-08-18 DIAGNOSIS — E119 Type 2 diabetes mellitus without complications: Secondary | ICD-10-CM | POA: Insufficient documentation

## 2019-08-18 DIAGNOSIS — Z79899 Other long term (current) drug therapy: Secondary | ICD-10-CM | POA: Insufficient documentation

## 2019-08-18 DIAGNOSIS — I2510 Atherosclerotic heart disease of native coronary artery without angina pectoris: Secondary | ICD-10-CM | POA: Diagnosis not present

## 2019-08-18 DIAGNOSIS — Z7901 Long term (current) use of anticoagulants: Secondary | ICD-10-CM | POA: Insufficient documentation

## 2019-08-18 DIAGNOSIS — G4730 Sleep apnea, unspecified: Secondary | ICD-10-CM | POA: Insufficient documentation

## 2019-08-18 DIAGNOSIS — Z6837 Body mass index (BMI) 37.0-37.9, adult: Secondary | ICD-10-CM | POA: Diagnosis not present

## 2019-08-18 DIAGNOSIS — I5032 Chronic diastolic (congestive) heart failure: Secondary | ICD-10-CM | POA: Insufficient documentation

## 2019-08-18 DIAGNOSIS — Z7984 Long term (current) use of oral hypoglycemic drugs: Secondary | ICD-10-CM | POA: Insufficient documentation

## 2019-08-18 DIAGNOSIS — J449 Chronic obstructive pulmonary disease, unspecified: Secondary | ICD-10-CM | POA: Insufficient documentation

## 2019-08-18 DIAGNOSIS — Z87891 Personal history of nicotine dependence: Secondary | ICD-10-CM | POA: Diagnosis not present

## 2019-08-18 DIAGNOSIS — I4819 Other persistent atrial fibrillation: Secondary | ICD-10-CM | POA: Insufficient documentation

## 2019-08-18 DIAGNOSIS — K219 Gastro-esophageal reflux disease without esophagitis: Secondary | ICD-10-CM | POA: Insufficient documentation

## 2019-08-18 DIAGNOSIS — E785 Hyperlipidemia, unspecified: Secondary | ICD-10-CM | POA: Diagnosis not present

## 2019-08-18 DIAGNOSIS — I110 Hypertensive heart disease with heart failure: Secondary | ICD-10-CM | POA: Insufficient documentation

## 2019-08-18 DIAGNOSIS — E669 Obesity, unspecified: Secondary | ICD-10-CM | POA: Insufficient documentation

## 2019-08-18 DIAGNOSIS — Z950 Presence of cardiac pacemaker: Secondary | ICD-10-CM | POA: Insufficient documentation

## 2019-08-18 HISTORY — PX: CARDIOVERSION: SHX1299

## 2019-08-18 LAB — GLUCOSE, CAPILLARY
Glucose-Capillary: 120 mg/dL — ABNORMAL HIGH (ref 70–99)
Glucose-Capillary: 62 mg/dL — ABNORMAL LOW (ref 70–99)

## 2019-08-18 SURGERY — CARDIOVERSION
Anesthesia: General

## 2019-08-18 MED ORDER — DEXTROSE 50 % IV SOLN
INTRAVENOUS | Status: AC
  Filled 2019-08-18: qty 50

## 2019-08-18 MED ORDER — LIDOCAINE 2% (20 MG/ML) 5 ML SYRINGE
INTRAMUSCULAR | Status: DC | PRN
  Administered 2019-08-18: 30 mg via INTRAVENOUS

## 2019-08-18 MED ORDER — SODIUM CHLORIDE 0.9 % IV SOLN
INTRAVENOUS | Status: DC | PRN
  Administered 2019-08-18: 09:00:00 via INTRAVENOUS

## 2019-08-18 MED ORDER — PROPOFOL 10 MG/ML IV BOLUS
INTRAVENOUS | Status: DC | PRN
  Administered 2019-08-18: 30 mg via INTRAVENOUS
  Administered 2019-08-18 (×2): 20 mg via INTRAVENOUS
  Administered 2019-08-18: 60 mg via INTRAVENOUS

## 2019-08-18 MED ORDER — DEXTROSE 50 % IV SOLN
12.5000 g | INTRAVENOUS | Status: AC
  Administered 2019-08-18: 12.5 g via INTRAVENOUS

## 2019-08-18 NOTE — Anesthesia Preprocedure Evaluation (Addendum)
Anesthesia Evaluation  Patient identified by MRN, date of birth, ID band Patient awake    Reviewed: Allergy & Precautions, NPO status , Patient's Chart, lab work & pertinent test results  History of Anesthesia Complications Negative for: history of anesthetic complications  Airway Mallampati: I  TM Distance: >3 FB Neck ROM: Full    Dental  (+) Edentulous Upper, Edentulous Lower   Pulmonary sleep apnea and Continuous Positive Airway Pressure Ventilation , COPD, former smoker,  08/15/2019 SARS coronavirus NEG   breath sounds clear to auscultation       Cardiovascular hypertension, Pt. on medications and Pt. on home beta blockers + CAD  + dysrhythmias Atrial Fibrillation + pacemaker  Rhythm:Irregular Rate:Normal  '19 ECHO: EF 55-60%, mild MR   Neuro/Psych negative neurological ROS     GI/Hepatic GERD  Controlled,  Endo/Other  diabetes, Oral Hypoglycemic AgentsMorbid obesity  Renal/GU      Musculoskeletal   Abdominal (+) + obese,   Peds  Hematology eliquis   Anesthesia Other Findings   Reproductive/Obstetrics                            Anesthesia Physical Anesthesia Plan  ASA: III  Anesthesia Plan: General   Post-op Pain Management:    Induction:   PONV Risk Score and Plan: 3 and Treatment may vary due to age or medical condition  Airway Management Planned: Natural Airway and Mask  Additional Equipment:   Intra-op Plan:   Post-operative Plan:   Informed Consent: I have reviewed the patients History and Physical, chart, labs and discussed the procedure including the risks, benefits and alternatives for the proposed anesthesia with the patient or authorized representative who has indicated his/her understanding and acceptance.       Plan Discussed with: CRNA and Surgeon  Anesthesia Plan Comments:        Anesthesia Quick Evaluation

## 2019-08-18 NOTE — Interval H&P Note (Signed)
History and Physical Interval Note:  08/18/2019 8:50 AM  Brandi Hunter  has presented today for surgery, with the diagnosis of afib.  The various methods of treatment have been discussed with the patient and family. After consideration of risks, benefits and other options for treatment, the patient has consented to  Procedure(s): CARDIOVERSION (N/A) as a surgical intervention.  The patient's history has been reviewed, patient examined, no change in status, stable for surgery.  I have reviewed the patient's chart and labs.  Questions were answered to the patient's satisfaction.     Kirk Ruths

## 2019-08-18 NOTE — H&P (Signed)
Thompson Grayer, MD  Physician  Electrophysiology  Progress Notes  Signed  Encounter Date:  08/14/2019      Related encounter: Telemedicine from 08/14/2019 in [facility]      Signed         Show:Clear all [x] Manual[x] Template[] Copied  Added by: [x] Thompson Grayer, MD  [] Hover for details     Electrophysiology TeleHealth Note  Due to national recommendations of social distancing due to [place] 19, an audio telehealth visit is felt to be most appropriate for this patient at this time.  Verbal consent was obtained by me for the telehealth visit today.  The patient does not have capability for a virtual visit.  A phone visit is therefore required today.   Date:  08/14/2019   ID:  CHANTE TAFF, DOB [birth year], MRN [id number]  Location: patient's home  Provider location:  [facility]  Evaluation Performed: Follow-up visit  PCP:  [name], FNP             Electrophysiologist:  Dr [name]  Chief Complaint:  palpitations  History of Present Illness:    Brandi Hunter is a 78 y.o. female who presents via telehealth conferencing today.  Since last being seen in our clinic, the patient reports doing reasonably well.  She has fatigue.  States "I just dont feel like doing anything".  She has been persistently in afib for at least 6 months.  Today, she denies symptoms of palpitations, chest pain, shortness of breath,  lower extremity edema, dizziness, presyncope, or syncope.  The patient is otherwise without complaint today.  The patient denies symptoms of fevers, chills, cough, or new SOB worrisome for COVID 19.      Past Medical History:  Diagnosis Date  . Asthma   . CAD (coronary artery disease)    a. catheterization in 03/2018 showing 90% ostial OM stenosis with medical management recommended at that time  . Chronic anticoagulation   . COPD (chronic obstructive pulmonary disease) ([place])   . DJD (degenerative joint disease)   .  Essential hypertension   . GERD (gastroesophageal reflux disease)   . History of home oxygen therapy   . Hyperlipidemia   . Mobitz (type) II atrioventricular block   . Obesity   . Pacemaker    Implanted by Dr [name] (MDT) 10/06/10  . PAF (paroxysmal atrial fibrillation) ([place])   . Pancreatitis 2010 OR 2011  . Sleep apnea    CPAP  . Type 2 diabetes mellitus ([place])          Past Surgical History:  Procedure Laterality Date  . CATARACT EXTRACTION W/PHACO  11/02/2011   Procedure: CATARACT EXTRACTION PHACO AND INTRAOCULAR LENS PLACEMENT (IOC);  Surgeon: Williams Che;  Location: [facility];  Service: Ophthalmology;  Laterality: Right;  CDE=7.33  . CATARACT EXTRACTION W/PHACO  12/07/2011   Procedure: CATARACT EXTRACTION PHACO AND INTRAOCULAR LENS PLACEMENT (IOC);  Surgeon: Williams Che, MD;  Location: [facility];  Service: Ophthalmology;  Laterality: Left;  CDE 3.61  . COLONOSCOPY WITH PROPOFOL N/A 08/09/2014   Procedure: COLONOSCOPY WITH PROPOFOL;  Surgeon: Juanita Craver, MD;  Location: [facility];  Service: Endoscopy;  Laterality: N/A;  . CYSTOCELE REPAIR    . ESOPHAGOGASTRODUODENOSCOPY (EGD) WITH PROPOFOL N/A 08/09/2014   Procedure: ESOPHAGOGASTRODUODENOSCOPY (EGD) WITH PROPOFOL;  Surgeon: Juanita Craver, MD;  Location: [facility];  Service: Endoscopy;  Laterality: N/A;  . EYE SURGERY  11/01/2012   BOTH EYES CATARACTS  . INSERT / REPLACE / REMOVE PACEMAKER  10/06/10   MDT  implanted by Dr [name]  . KNEE ARTHROSCOPY     both  . LEFT HEART CATH AND CORONARY ANGIOGRAPHY N/A 03/24/2018   Procedure: LEFT HEART CATH AND CORONARY ANGIOGRAPHY;  Surgeon: Lorretta Harp, MD;  Location: [facility];  Service: Cardiovascular;  Laterality: N/A;  . OVARY SURGERY     removal  . REPAIR RECTOCELE    . SIMPLE MASTECTOMY WITH AXILLARY SENTINEL NODE BIOPSY Left 01/09/2015   Procedure: Irrigation and Drainage Abcess left Axilla;  Surgeon: Jackolyn Confer, MD;   Location: [facility];  Service: General;  Laterality: Left;  . TONSILLECTOMY  AGE 78  . TOTAL ABDOMINAL HYSTERECTOMY  1971          Current Outpatient Medications  Medication Sig Dispense Refill  . albuterol (PROAIR HFA) 108 (90 BASE) MCG/ACT inhaler Inhale 2 puffs into the lungs every 4 (four) hours as needed for wheezing.     Marland Kitchen apixaban (ELIQUIS) 5 MG TABS tablet Take 1 tablet (5 mg total) by mouth 2 (two) times daily. 180 tablet 3  . atorvastatin (LIPITOR) 40 MG tablet Take 40 mg by mouth daily.     . budesonide-formoterol (SYMBICORT) 160-4.5 MCG/ACT inhaler Inhale 2 puffs into the lungs 2 (two) times daily.    . fenofibrate 160 MG tablet Take 160 mg by mouth every evening.    . furosemide (LASIX) 20 MG tablet Take 1 tablet (20 mg total) by mouth daily. 90 tablet 3  . glipiZIDE (GLUCOTROL XL) 10 MG 24 hr tablet Take 20 mg by mouth daily with breakfast.     . ipratropium-albuterol (DUONEB) 0.5-2.5 (3) MG/3ML SOLN Take 3 mLs by nebulization 2 (two) times daily.    . isosorbide mononitrate (IMDUR) 30 MG 24 hr tablet Take 1 tablet (30 mg total) by mouth 2 (two) times daily. 180 tablet 3  . LORazepam (ATIVAN) 0.5 MG tablet Take 0.25-0.5 mg by mouth 2 (two) times daily as needed for anxiety.     . metFORMIN (GLUCOPHAGE) 1000 MG tablet Take 1 tablet (1,000 mg total) by mouth 2 (two) times daily with a meal.    . metoprolol succinate (TOPROL-XL) 100 MG 24 hr tablet TAKE 1 TABLET BY MOUTH ONCE DAILY 90 tablet 2  . nitroGLYCERIN (NITROSTAT) 0.4 MG SL tablet DISSOLVE ONE TABLET UNDER THE TONGUE EVERY 5 MINUTES AS NEEDED FOR CHEST PAIN.  DO NOT EXCEED A TOTAL OF 3 DOSES IN 15 MINUTES 25 tablet 1  . Probiotic Product (ALIGN PO) Take 1 capsule by mouth daily.     Marland Kitchen spironolactone (ALDACTONE) 25 MG tablet Take 1 tablet (25 mg total) by mouth daily. 90 tablet 3  . telmisartan (MICARDIS) 80 MG tablet Take 40 mg by mouth daily. Take 1/2 tablet (40 mg) daily    . triamcinolone ointment  (KENALOG) 0.5 % Apply 1 application topically 2 (two) times daily.    Marland Kitchen amLODipine (NORVASC) 5 MG tablet Take 1 tablet (5 mg total) by mouth daily. 90 tablet 3  . potassium chloride SA (KLOR-CON M20) 20 MEQ tablet Take 1 tablet (20 mEq total) by mouth daily. (Patient taking differently: Take 20 mEq by mouth daily. Takes only with lasix) 90 tablet 3   No current facility-administered medications for this visit.     Allergies:   Bee venom and Latex   Social History:  The patient  reports that she quit smoking about 22 months ago. Her smoking use included cigarettes. She smoked 0.00 packs per day for  54.00 years. She quit smokeless tobacco use about 7 years ago. She reports previous alcohol use of about 1.0 standard drinks of alcohol per week. She reports that she does not use drugs.   Family History:  The patient's family history includes Congestive Heart Failure in her father and mother; Diabetes in her brother; Osteoarthritis in her sister; Prostate cancer in her brother.   ROS:  Please see the history of present illness.   All other systems are personally reviewed and negative.    Exam:    Vital Signs:  BP (!) 149/87   Pulse 87   Ht 5\' 6"  (1.676 m)   Wt 231 lb (104.8 kg)   SpO2 98%   BMI 37.28 kg/m   Well sounding, alert and conversant  Labs/Other Tests and Data Reviewed:    Recent Labs: 12/16/2018: B Natriuretic Peptide 271.0 01/24/2019: BUN 26; Creatinine, Ser 1.25; Potassium 4.2; Sodium 139      Wt Readings from Last 3 Encounters:  08/14/19 231 lb (104.8 kg)  08/03/19 242 lb (109.8 kg)  03/27/19 235 lb (106.6 kg)     Last device remote is reviewed from [place] PDF which reveals normal device function, no arrhythmias   ASSESSMENT & PLAN:    1.  Symptomatic second degree AV block Remotes are uptodate Normal device function  2. afib Previously paroxysmal, now persistent Burden has increased to 100% (0.1% last visit) She is on eliquis Rate  controlled due to AV block Risks of cardioversion were discussed with the patient.  She understands risks and wishes to proceed.  Hopefully her fatigue will improve with sinus. If she does not maintain sinus, we will need to consider AAD therapy vs ablation.  3. CAD Followed by Dr Domenic Polite No ischemic symptoms  4. HTN Stable No change required today  5. Tobacco Quit 2018!  6. Chronic diastolic dysfunction stable   Follow-up:  AF clinic 2 weeks post cardioversion Me in 3 months   Patient Risk:  after full review of this patients clinical status, I feel that they are at moderate risk at this time.  Today, I have spent 15 minutes with the patient with telehealth technology discussing arrhythmia management .    Army Fossa, MD  08/14/2019 10:02 AM     [facility] [address] [place] [address] [phone] (office) [phone] (fax)         For DCCV; compliant with apixaban; no changes Kirk Ruths

## 2019-08-18 NOTE — Anesthesia Postprocedure Evaluation (Addendum)
Anesthesia Post Note  Patient: Brandi Hunter  Procedure(s) Performed: CARDIOVERSION (N/A )     Patient location during evaluation: Endoscopy Anesthesia Type: General Level of consciousness: awake and alert, patient cooperative and oriented Pain management: pain level controlled Vital Signs Assessment: post-procedure vital signs reviewed and stable Respiratory status: nonlabored ventilation, spontaneous breathing and respiratory function stable Cardiovascular status: blood pressure returned to baseline and stable Postop Assessment: no apparent nausea or vomiting Anesthetic complications: no    Last Vitals:  Vitals:   08/18/19 0933 08/18/19 0943  BP: 101/60 121/63  Pulse: 74 69  Resp: 14 (!) 23  Temp:    SpO2: 98% 99%    Last Pain:  Vitals:   08/18/19 0934  TempSrc:   PainSc: 0-No pain                 Raidon Swanner,E. Dorisann Schwanke

## 2019-08-18 NOTE — Procedures (Addendum)
Electrical Cardioversion Procedure Note FLETCHER SACCOMANNO [id number] 07/18/1941  Procedure: Electrical Cardioversion Indications:  Atrial Fibrillation  Procedure Details Consent: Risks of procedure as well as the alternatives and risks of each were explained to the (patient/caregiver).  Consent for procedure obtained. Time Out: Verified patient identification, verified procedure, site/side was marked, verified correct patient position, special equipment/implants available, medications/allergies/relevent history reviewed, required imaging and test results available.  Performed  Patient placed on cardiac monitor, pulse oximetry, supplemental oxygen as necessary.  Sedation given: Pt sedated by anesthesia with diprovan 130 mg IV. Pacer pads placed anterior and posterior chest.  Cardioverted 1 time(s).  Cardioverted at 120J. Converted to sinus then reverted to atrial fibrillation. Subsequently had DCCV with 120J, 120J and 200J to sinus with ventricular pacing. Then had recurrent atrial fibrillation.  Evaluation Findings: Post procedure EKG shows: atrial fibrillation Complications: None Patient did tolerate procedure well.   Kirk Ruths 08/18/2019, 8:51 AM

## 2019-08-18 NOTE — Transfer of Care (Signed)
Immediate Anesthesia Transfer of Care Note  Patient: Brandi Hunter  Procedure(s) Performed: CARDIOVERSION (N/A )  Patient Location: Endoscopy Unit  Anesthesia Type:General  Level of Consciousness: drowsy and patient cooperative  Airway & Oxygen Therapy: Patient Spontanous Breathing  Post-op Assessment: Report given to RN, Post -op Vital signs reviewed and stable and Patient moving all extremities X 4  Post vital signs: Reviewed and stable  Last Vitals:  Vitals Value Taken Time  BP    Temp    Pulse    Resp    SpO2      Last Pain:  Vitals:   08/18/19 0838  TempSrc: Oral  PainSc: 0-No pain         Complications: No apparent anesthesia complications

## 2019-08-21 ENCOUNTER — Encounter (HOSPITAL_COMMUNITY): Payer: Self-pay | Admitting: Cardiology

## 2019-08-29 ENCOUNTER — Encounter (HOSPITAL_COMMUNITY): Payer: Self-pay | Admitting: Student

## 2019-08-29 ENCOUNTER — Inpatient Hospital Stay (HOSPITAL_COMMUNITY)
Admission: EM | Admit: 2019-08-29 | Discharge: 2019-08-31 | DRG: 189 | Disposition: A | Discharge status: Home / Self Care | Payer: Medicare Other | Attending: Family Medicine | Admitting: Family Medicine

## 2019-08-29 ENCOUNTER — Emergency Department (HOSPITAL_COMMUNITY): Payer: Medicare Other

## 2019-08-29 ENCOUNTER — Other Ambulatory Visit: Payer: Self-pay

## 2019-08-29 DIAGNOSIS — Z833 Family history of diabetes mellitus: Secondary | ICD-10-CM

## 2019-08-29 DIAGNOSIS — N183 Chronic kidney disease, stage 3 unspecified: Secondary | ICD-10-CM | POA: Diagnosis present

## 2019-08-29 DIAGNOSIS — Z9012 Acquired absence of left breast and nipple: Secondary | ICD-10-CM | POA: Diagnosis not present

## 2019-08-29 DIAGNOSIS — G4733 Obstructive sleep apnea (adult) (pediatric): Secondary | ICD-10-CM | POA: Diagnosis present

## 2019-08-29 DIAGNOSIS — Z9981 Dependence on supplemental oxygen: Secondary | ICD-10-CM

## 2019-08-29 DIAGNOSIS — E1165 Type 2 diabetes mellitus with hyperglycemia: Secondary | ICD-10-CM | POA: Diagnosis present

## 2019-08-29 DIAGNOSIS — L853 Xerosis cutis: Secondary | ICD-10-CM | POA: Diagnosis present

## 2019-08-29 DIAGNOSIS — I5032 Chronic diastolic (congestive) heart failure: Secondary | ICD-10-CM | POA: Diagnosis present

## 2019-08-29 DIAGNOSIS — R0602 Shortness of breath: Secondary | ICD-10-CM | POA: Diagnosis present

## 2019-08-29 DIAGNOSIS — J9620 Acute and chronic respiratory failure, unspecified whether with hypoxia or hypercapnia: Secondary | ICD-10-CM | POA: Diagnosis present

## 2019-08-29 DIAGNOSIS — Z9089 Acquired absence of other organs: Secondary | ICD-10-CM

## 2019-08-29 DIAGNOSIS — N179 Acute kidney failure, unspecified: Secondary | ICD-10-CM | POA: Diagnosis present

## 2019-08-29 DIAGNOSIS — J441 Chronic obstructive pulmonary disease with (acute) exacerbation: Secondary | ICD-10-CM | POA: Diagnosis not present

## 2019-08-29 DIAGNOSIS — F339 Major depressive disorder, recurrent, unspecified: Secondary | ICD-10-CM | POA: Diagnosis not present

## 2019-08-29 DIAGNOSIS — Z9841 Cataract extraction status, right eye: Secondary | ICD-10-CM

## 2019-08-29 DIAGNOSIS — Z90710 Acquired absence of both cervix and uterus: Secondary | ICD-10-CM | POA: Diagnosis not present

## 2019-08-29 DIAGNOSIS — E1122 Type 2 diabetes mellitus with diabetic chronic kidney disease: Secondary | ICD-10-CM | POA: Diagnosis not present

## 2019-08-29 DIAGNOSIS — I130 Hypertensive heart and chronic kidney disease with heart failure and stage 1 through stage 4 chronic kidney disease, or unspecified chronic kidney disease: Secondary | ICD-10-CM | POA: Diagnosis present

## 2019-08-29 DIAGNOSIS — Z20828 Contact with and (suspected) exposure to other viral communicable diseases: Secondary | ICD-10-CM | POA: Diagnosis present

## 2019-08-29 DIAGNOSIS — E782 Mixed hyperlipidemia: Secondary | ICD-10-CM | POA: Diagnosis present

## 2019-08-29 DIAGNOSIS — I441 Atrioventricular block, second degree: Secondary | ICD-10-CM | POA: Diagnosis present

## 2019-08-29 DIAGNOSIS — Z8249 Family history of ischemic heart disease and other diseases of the circulatory system: Secondary | ICD-10-CM

## 2019-08-29 DIAGNOSIS — Z91040 Latex allergy status: Secondary | ICD-10-CM

## 2019-08-29 DIAGNOSIS — Z961 Presence of intraocular lens: Secondary | ICD-10-CM | POA: Diagnosis present

## 2019-08-29 DIAGNOSIS — E1142 Type 2 diabetes mellitus with diabetic polyneuropathy: Secondary | ICD-10-CM | POA: Diagnosis not present

## 2019-08-29 DIAGNOSIS — Z87891 Personal history of nicotine dependence: Secondary | ICD-10-CM

## 2019-08-29 DIAGNOSIS — Z950 Presence of cardiac pacemaker: Secondary | ICD-10-CM

## 2019-08-29 DIAGNOSIS — I2583 Coronary atherosclerosis due to lipid rich plaque: Secondary | ICD-10-CM | POA: Diagnosis present

## 2019-08-29 DIAGNOSIS — J9621 Acute and chronic respiratory failure with hypoxia: Principal | ICD-10-CM | POA: Diagnosis present

## 2019-08-29 DIAGNOSIS — K760 Fatty (change of) liver, not elsewhere classified: Secondary | ICD-10-CM | POA: Diagnosis present

## 2019-08-29 DIAGNOSIS — I2510 Atherosclerotic heart disease of native coronary artery without angina pectoris: Secondary | ICD-10-CM | POA: Diagnosis not present

## 2019-08-29 DIAGNOSIS — Z9842 Cataract extraction status, left eye: Secondary | ICD-10-CM

## 2019-08-29 DIAGNOSIS — Z7901 Long term (current) use of anticoagulants: Secondary | ICD-10-CM

## 2019-08-29 DIAGNOSIS — I482 Chronic atrial fibrillation, unspecified: Secondary | ICD-10-CM | POA: Diagnosis present

## 2019-08-29 DIAGNOSIS — Z7951 Long term (current) use of inhaled steroids: Secondary | ICD-10-CM

## 2019-08-29 DIAGNOSIS — Z7984 Long term (current) use of oral hypoglycemic drugs: Secondary | ICD-10-CM

## 2019-08-29 DIAGNOSIS — Z79899 Other long term (current) drug therapy: Secondary | ICD-10-CM

## 2019-08-29 LAB — BASIC METABOLIC PANEL
Anion gap: 13 (ref 5–15)
BUN: 39 mg/dL — ABNORMAL HIGH (ref 8–23)
CO2: 25 mmol/L (ref 22–32)
Calcium: 9.5 mg/dL (ref 8.9–10.3)
Chloride: 101 mmol/L (ref 98–111)
Creatinine, Ser: 1.81 mg/dL — ABNORMAL HIGH (ref 0.44–1.00)
GFR calc Af Amer: 31 mL/min — ABNORMAL LOW (ref >60)
GFR calc non Af Amer: 27 mL/min — ABNORMAL LOW (ref >60)
Glucose, Bld: 240 mg/dL — ABNORMAL HIGH (ref 70–99)
Potassium: 4.9 mmol/L (ref 3.5–5.1)
Sodium: 139 mmol/L (ref 135–145)

## 2019-08-29 LAB — CBC WITH DIFFERENTIAL/PLATELET
Abs Immature Granulocytes: 0.02 10*3/uL (ref 0.00–0.07)
Basophils Absolute: 0.1 10*3/uL (ref 0.0–0.1)
Basophils Relative: 1 %
Eosinophils Absolute: 0.7 10*3/uL — ABNORMAL HIGH (ref 0.0–0.5)
Eosinophils Relative: 8 %
HCT: 43.5 % (ref 36.0–46.0)
Hemoglobin: 13.2 g/dL (ref 12.0–15.0)
Immature Granulocytes: 0 %
Lymphocytes Relative: 16 %
Lymphs Abs: 1.4 10*3/uL (ref 0.7–4.0)
MCH: 28.4 pg (ref 26.0–34.0)
MCHC: 30.3 g/dL (ref 30.0–36.0)
MCV: 93.5 fL (ref 80.0–100.0)
Monocytes Absolute: 0.4 10*3/uL (ref 0.1–1.0)
Monocytes Relative: 4 %
Neutro Abs: 6.5 10*3/uL (ref 1.7–7.7)
Neutrophils Relative %: 71 %
Platelets: 419 10*3/uL — ABNORMAL HIGH (ref 150–400)
RBC: 4.65 MIL/uL (ref 3.87–5.11)
RDW: 14.3 % (ref 11.5–15.5)
WBC: 9.1 10*3/uL (ref 4.0–10.5)
nRBC: 0 % (ref 0.0–0.2)

## 2019-08-29 LAB — GLUCOSE, CAPILLARY: Glucose-Capillary: 223 mg/dL — ABNORMAL HIGH (ref 70–99)

## 2019-08-29 LAB — TROPONIN I (HIGH SENSITIVITY)
Troponin I (High Sensitivity): 13 ng/L (ref <18)
Troponin I (High Sensitivity): 15 ng/L (ref <18)

## 2019-08-29 LAB — BRAIN NATRIURETIC PEPTIDE: B Natriuretic Peptide: 298 pg/mL — ABNORMAL HIGH (ref 0.0–100.0)

## 2019-08-29 LAB — SARS CORONAVIRUS 2 BY RT PCR (HOSPITAL ORDER, PERFORMED IN ~~LOC~~ HOSPITAL LAB): SARS Coronavirus 2: NEGATIVE

## 2019-08-29 MED ORDER — SODIUM CHLORIDE 0.9 % IV SOLN
100.0000 mg | Freq: Two times a day (BID) | INTRAVENOUS | Status: DC
  Administered 2019-08-29 – 2019-08-30 (×3): 100 mg via INTRAVENOUS
  Filled 2019-08-29 (×7): qty 100

## 2019-08-29 MED ORDER — ACETAMINOPHEN 650 MG RE SUPP: 650.0000 mg | Freq: Four times a day (QID) | RECTAL | Status: DC | PRN

## 2019-08-29 MED ORDER — SODIUM CHLORIDE 0.9 % IV SOLN
INTRAVENOUS | Status: DC
  Administered 2019-08-30: via INTRAVENOUS

## 2019-08-29 MED ORDER — IPRATROPIUM-ALBUTEROL 0.5-2.5 (3) MG/3ML IN SOLN
3.0000 mL | RESPIRATORY_TRACT | Status: DC | PRN
  Filled 2019-08-29: qty 3

## 2019-08-29 MED ORDER — IPRATROPIUM-ALBUTEROL 0.5-2.5 (3) MG/3ML IN SOLN
3.0000 mL | Freq: Four times a day (QID) | RESPIRATORY_TRACT | Status: DC
  Administered 2019-08-30 (×4): 3 mL via RESPIRATORY_TRACT
  Filled 2019-08-29 (×4): qty 3

## 2019-08-29 MED ORDER — INSULIN ASPART 100 UNIT/ML ~~LOC~~ SOLN: 0.0000 [IU] | Freq: Every day | SUBCUTANEOUS | Status: DC

## 2019-08-29 MED ORDER — LORAZEPAM 0.5 MG PO TABS
0.5000 mg | ORAL_TABLET | Freq: Every day | ORAL | Status: DC
  Administered 2019-08-30 (×2): 0.5 mg via ORAL
  Filled 2019-08-29 (×2): qty 1

## 2019-08-29 MED ORDER — AEROCHAMBER PLUS FLO-VU MEDIUM MISC
1.0000 | Freq: Once | Status: AC
  Administered 2019-08-29: 19:00:00 1
  Filled 2019-08-29 (×2): qty 1

## 2019-08-29 MED ORDER — POLYETHYLENE GLYCOL 3350 17 G PO PACK: 17.0000 g | PACK | Freq: Every day | ORAL | Status: DC | PRN

## 2019-08-29 MED ORDER — IPRATROPIUM BROMIDE 0.02 % IN SOLN
0.5000 mg | Freq: Once | RESPIRATORY_TRACT | Status: AC
  Administered 2019-08-29: 20:00:00 0.5 mg via RESPIRATORY_TRACT
  Filled 2019-08-29: qty 2.5

## 2019-08-29 MED ORDER — APIXABAN 5 MG PO TABS
5.0000 mg | ORAL_TABLET | Freq: Two times a day (BID) | ORAL | Status: DC
  Administered 2019-08-30 – 2019-08-31 (×4): 5 mg via ORAL
  Filled 2019-08-29 (×4): qty 1

## 2019-08-29 MED ORDER — METOPROLOL SUCCINATE ER 50 MG PO TB24
100.0000 mg | ORAL_TABLET | Freq: Every day | ORAL | Status: DC
  Administered 2019-08-30 – 2019-08-31 (×2): 100 mg via ORAL
  Filled 2019-08-29 (×2): qty 2

## 2019-08-29 MED ORDER — GUAIFENESIN-DM 100-10 MG/5ML PO SYRP: 10.0000 mL | ORAL_SOLUTION | Freq: Four times a day (QID) | ORAL | Status: DC | PRN

## 2019-08-29 MED ORDER — SPIRONOLACTONE 25 MG PO TABS
25.0000 mg | ORAL_TABLET | Freq: Every day | ORAL | Status: DC
  Administered 2019-08-30 – 2019-08-31 (×2): 25 mg via ORAL
  Filled 2019-08-29 (×2): qty 1

## 2019-08-29 MED ORDER — METHYLPREDNISOLONE SODIUM SUCC 125 MG IJ SOLR
60.0000 mg | Freq: Two times a day (BID) | INTRAMUSCULAR | Status: DC
  Administered 2019-08-30 (×2): 60 mg via INTRAVENOUS
  Filled 2019-08-29 (×2): qty 2

## 2019-08-29 MED ORDER — AMLODIPINE BESYLATE 5 MG PO TABS
5.0000 mg | ORAL_TABLET | Freq: Every day | ORAL | Status: DC
  Administered 2019-08-30 – 2019-08-31 (×2): 5 mg via ORAL
  Filled 2019-08-29 (×2): qty 1

## 2019-08-29 MED ORDER — ALBUTEROL (5 MG/ML) CONTINUOUS INHALATION SOLN
10.0000 mg/h | INHALATION_SOLUTION | Freq: Once | RESPIRATORY_TRACT | Status: AC
  Administered 2019-08-29: 10 mg/h via RESPIRATORY_TRACT
  Filled 2019-08-29: qty 20

## 2019-08-29 MED ORDER — MAGNESIUM SULFATE 2 GM/50ML IV SOLN
2.0000 g | Freq: Once | INTRAVENOUS | Status: AC
  Administered 2019-08-29: 19:00:00 2 g via INTRAVENOUS
  Filled 2019-08-29: qty 50

## 2019-08-29 MED ORDER — INSULIN ASPART 100 UNIT/ML ~~LOC~~ SOLN
0.0000 [IU] | Freq: Three times a day (TID) | SUBCUTANEOUS | Status: DC
  Administered 2019-08-30: 17:00:00 3 [IU] via SUBCUTANEOUS
  Administered 2019-08-30 (×2): 8 [IU] via SUBCUTANEOUS
  Administered 2019-08-31: 5 [IU] via SUBCUTANEOUS
  Administered 2019-08-31: 3 [IU] via SUBCUTANEOUS

## 2019-08-29 MED ORDER — METHYLPREDNISOLONE SODIUM SUCC 125 MG IJ SOLR
125.0000 mg | Freq: Once | INTRAMUSCULAR | Status: AC
  Administered 2019-08-29: 19:00:00 125 mg via INTRAVENOUS
  Filled 2019-08-29: qty 2

## 2019-08-29 MED ORDER — ONDANSETRON HCL 4 MG/2ML IJ SOLN: 4.0000 mg | Freq: Four times a day (QID) | INTRAMUSCULAR | Status: DC | PRN

## 2019-08-29 MED ORDER — ATORVASTATIN CALCIUM 40 MG PO TABS
40.0000 mg | ORAL_TABLET | Freq: Every day | ORAL | Status: DC
  Administered 2019-08-30: 18:00:00 40 mg via ORAL
  Filled 2019-08-29: qty 1

## 2019-08-29 MED ORDER — ONDANSETRON HCL 4 MG PO TABS: 4.0000 mg | ORAL_TABLET | Freq: Four times a day (QID) | ORAL | Status: DC | PRN

## 2019-08-29 MED ORDER — ISOSORBIDE MONONITRATE ER 30 MG PO TB24
30.0000 mg | ORAL_TABLET | Freq: Two times a day (BID) | ORAL | Status: DC
  Administered 2019-08-30 – 2019-08-31 (×4): 30 mg via ORAL
  Filled 2019-08-29 (×4): qty 1

## 2019-08-29 MED ORDER — ACETAMINOPHEN 325 MG PO TABS: 650.0000 mg | ORAL_TABLET | Freq: Four times a day (QID) | ORAL | Status: DC | PRN

## 2019-08-29 MED ORDER — GLIPIZIDE ER 5 MG PO TB24
20.0000 mg | ORAL_TABLET | Freq: Every day | ORAL | Status: DC
  Administered 2019-08-30: 08:00:00 20 mg via ORAL
  Filled 2019-08-29: qty 2
  Filled 2019-08-29: qty 4
  Filled 2019-08-29 (×2): qty 2

## 2019-08-29 MED ORDER — ALBUTEROL SULFATE HFA 108 (90 BASE) MCG/ACT IN AERS
8.0000 | INHALATION_SPRAY | Freq: Once | RESPIRATORY_TRACT | Status: AC
  Administered 2019-08-29: 19:00:00 8 via RESPIRATORY_TRACT
  Filled 2019-08-29: qty 6.7

## 2019-08-29 MED ORDER — MOMETASONE FURO-FORMOTEROL FUM 200-5 MCG/ACT IN AERO
2.0000 | INHALATION_SPRAY | Freq: Two times a day (BID) | RESPIRATORY_TRACT | Status: DC
  Administered 2019-08-30 – 2019-08-31 (×3): 2 via RESPIRATORY_TRACT
  Filled 2019-08-29: qty 8.8

## 2019-08-29 MED ORDER — SODIUM CHLORIDE 0.9 % IV SOLN
INTRAVENOUS | Status: DC | PRN
  Administered 2019-08-29: 19:00:00 250 mL via INTRAVENOUS

## 2019-08-29 NOTE — ED Provider Notes (Signed)
[facility] EMERGENCY DEPARTMENT Provider Note   CSN: [id number] Arrival date & time: 08/29/19  1720     History   Chief Complaint Chief Complaint  Patient presents with   Shortness of Breath    HPI Brandi Hunter is a 78 y.o. 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 Patient states she has felt constantly short of breath w/ associated wheezing & intermittent chest tightness. Sxs progressively worsening. Aggravated w/ exertion, no other alleviating/aggravating factors. Using her inhalers without relief. States she uses 2L via [place] @ home at all times, but that she typically does not need oxygen when she leaves the house. She has felt short of breath on her 2L and has not felt able to leave the house without it as she typically would. Denies fever, chills, cough, URI sxs, abdominal pain, nausea, vomiting, diaphoresis, or acute leg pain/swelling. Denies hemoptysis, recent trauma, recent long travel, hormone use, personal hx of cancer, or hx of DVT/PE. She has been compliant with her eliquis. States no recent surgeries but did have attempted cardioversion 08/18/19.         HPI  Past Medical History:  Diagnosis Date   Asthma    CAD (coronary artery disease)    a. catheterization in 03/2018 showing 90% ostial OM stenosis with medical management recommended at that time   Chronic anticoagulation    COPD (chronic obstructive pulmonary disease) (HCC)    DJD (degenerative joint disease)    Essential hypertension    GERD (gastroesophageal reflux disease)    History of home oxygen therapy    Hyperlipidemia    Mobitz (type) II atrioventricular block    Obesity    Pacemaker    Implanted by Dr [name] (MDT) 10/06/10   PAF (paroxysmal atrial fibrillation) ([place])    Pancreatitis 2010 OR 2011   Sleep apnea    CPAP   Type 2 diabetes mellitus ([place])      Patient Active Problem List   Diagnosis Date Noted   Chest pain 07/18/2018   Sepsis due to urinary tract infection ([place]) 06/30/2018   AKI (acute kidney injury) ([place]) 06/30/2018   Sepsis, unspecified organism ([place]) 06/30/2018   Normocytic anemia 06/30/2018   Coronary artery disease due to lipid rich plaque    Unstable angina ([place]) 03/23/2018   Acute respiratory distress 09/26/2017   Acute exacerbation of chronic obstructive pulmonary disease (COPD) ([place]) 09/26/2017   Dependence on nocturnal oxygen therapy 09/26/2017   Type 2 diabetes mellitus ([place]) 09/26/2017   Acute on chronic diastolic CHF (congestive heart failure) ([place]) 09/26/2017   Cardiac pacemaker 11/17/2016   Eczema 11/17/2016   Major depression, recurrent, chronic ([place]) 09/17/2016   Axillary abscess 07/22/2016   Chronic left shoulder pain 10/01/2015   Xerosis cutis 10/01/2015   Paroxysmal atrial fibrillation ([place]) 05/24/2014   PAF (paroxysmal atrial fibrillation) ([place]) 05/24/2014   NAFLD (nonalcoholic fatty liver disease) 03/29/2014   Renal cyst 03/29/2014   Elevated TSH 03/06/2014   Type 2 diabetes mellitus without complication, without long-term current use of insulin ([place]) 03/06/2014   Peripheral neuropathy 04/27/2013   Vitamin B12 deficiency 04/27/2013   Abnormality of gait 03/29/2013   Diabetic neuropathy ([place]) 03/29/2013   OSA (obstructive sleep apnea) 08/26/2012   Chronic asthmatic bronchitis ([place]) 08/26/2012   Preop cardiovascular exam 08/15/2012   Nocturnal hypoxia 04/08/2012   Seborrheic dermatitis 10/21/2011  IBS (irritable bowel syndrome) 05/27/2011   Esophageal reflux 03/27/2011   Mixed hyperlipidemia 03/27/2011   Chronic obstructive pulmonary disease ([place]) 03/27/2011   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 02/09/2011   Essential hypertension 02/09/2011   Obesity 02/09/2011   Tobacco use disorder 02/05/2011   Vitamin D  deficiency 12/30/2010   Osteoarthrosis involving lower leg 12/26/2010   Allergic rhinitis 01/23/2009   Mixed incontinence 10/08/2008    Past Surgical History:  Procedure Laterality Date   CARDIOVERSION N/A 08/18/2019   Procedure: CARDIOVERSION;  Surgeon: Lelon Perla, MD;  Location: [facility];  Service: Cardiovascular;  Laterality: N/A;   CATARACT EXTRACTION W/PHACO  11/02/2011   Procedure: CATARACT EXTRACTION PHACO AND INTRAOCULAR LENS PLACEMENT ([place]);  Surgeon: Williams Che;  Location: [facility];  Service: Ophthalmology;  Laterality: Right;  CDE=7.33   CATARACT EXTRACTION W/PHACO  12/07/2011   Procedure: CATARACT EXTRACTION PHACO AND INTRAOCULAR LENS PLACEMENT (IOC);  Surgeon: Williams Che, MD;  Location: [facility];  Service: Ophthalmology;  Laterality: Left;  CDE 3.61   COLONOSCOPY WITH PROPOFOL N/A 08/09/2014   Procedure: COLONOSCOPY WITH PROPOFOL;  Surgeon: Juanita Craver, MD;  Location: [facility];  Service: Endoscopy;  Laterality: N/A;   CYSTOCELE REPAIR     ESOPHAGOGASTRODUODENOSCOPY (EGD) WITH PROPOFOL N/A 08/09/2014   Procedure: ESOPHAGOGASTRODUODENOSCOPY (EGD) WITH PROPOFOL;  Surgeon: Juanita Craver, MD;  [facility];  Service: Endoscopy;  Laterality: N/A;   EYE SURGERY  11/01/2012   BOTH EYES CATARACTS   INSERT / REPLACE / REMOVE PACEMAKER  10/06/10   MDT  implanted by Dr [name]   KNEE ARTHROSCOPY     both   LEFT HEART CATH AND CORONARY ANGIOGRAPHY N/A 03/24/2018   Procedure: LEFT HEART CATH AND CORONARY ANGIOGRAPHY;  Surgeon: Lorretta Harp, MD;  Location: [facility];  Service: Cardiovascular;  Laterality: N/A;   OVARY SURGERY     removal   REPAIR RECTOCELE     SIMPLE MASTECTOMY WITH AXILLARY SENTINEL NODE BIOPSY Left 01/09/2015   Procedure: Irrigation and Drainage Abcess left Axilla;  Surgeon: Jackolyn Confer, MD;  Location: [facility];  Service: General;  Laterality: Left;   TONSILLECTOMY  AGE 20   TOTAL ABDOMINAL HYSTERECTOMY  1971       OB History    Gravida  3   Para  3   Term  3   Preterm      AB      Living  3     SAB      TAB      Ectopic      Multiple      Live Births               Home Medications    Prior to Admission medications   Medication Sig Start Date End Date Taking? Authorizing Provider  albuterol (PROAIR HFA) 108 (90 BASE) MCG/ACT inhaler Inhale 2 puffs into the lungs every 4 (four) hours as needed for wheezing.  07/21/12   [provider]  amLODipine (NORVASC) 5 MG tablet Take 1 tablet (5 mg total) by mouth daily. 12/07/18 08/15/19  Satira Sark, MD  apixaban (ELIQUIS) 5 MG TABS tablet Take 1 tablet (5 mg total) by mouth 2 (two) times daily. 07/04/19   Satira Sark, MD  atorvastatin (LIPITOR) 40 MG tablet Take 40 mg by mouth daily.  03/17/13   [provider]  budesonide-formoterol (SYMBICORT) 160-4.5 MCG/ACT inhaler Inhale 2 puffs into the lungs 2 (two)  times daily.    [provider]  fenofibrate 160 MG tablet Take 160 mg by mouth every evening.    [provider]  furosemide (LASIX) 20 MG tablet Take 1 tablet (20 mg total) by mouth daily. 12/07/18 12/07/19  Satira Sark, MD  glipiZIDE (GLUCOTROL XL) 10 MG 24 hr tablet Take 20 mg by mouth daily with breakfast.  08/20/13   [provider]  ipratropium-albuterol (DUONEB) 0.5-2.5 (3) MG/3ML SOLN Take 3 mLs by nebulization 2 (two) times daily.    [provider]  isosorbide mononitrate (IMDUR) 30 MG 24 hr tablet Take 1 tablet (30 mg total) by mouth 2 (two) times daily. 08/03/19 08/02/20  Satira Sark, MD  LORazepam (ATIVAN) 0.5 MG tablet Take 0.25-0.5 mg by mouth 2 (two) times daily as needed for anxiety.  04/28/13   [provider]  metFORMIN (GLUCOPHAGE) 1000 MG tablet Take 1 tablet (1,000 mg total) by mouth 2 (two) times daily with a meal. 03/27/18   Isaiah Serge, NP  metoprolol succinate (TOPROL-XL) 100 MG 24 hr tablet TAKE 1 TABLET BY MOUTH ONCE  DAILY Patient taking differently: Take 100 mg by mouth daily.  11/18/18   [name], [name], MD  nitroGLYCERIN (NITROSTAT) 0.4 MG SL tablet DISSOLVE ONE TABLET UNDER THE TONGUE EVERY 5 MINUTES AS NEEDED FOR CHEST PAIN.  DO NOT EXCEED A TOTAL OF 3 DOSES IN 15 MINUTES Patient taking differently: Place 0.4 mg under the tongue every 5 (five) minutes as needed for chest pain.  07/04/19   Satira Sark, MD  potassium chloride SA (KLOR-CON M20) 20 MEQ tablet Take 1 tablet (20 mEq total) by mouth daily. Patient not taking: Reported on 08/15/2019 12/26/18 08/03/19  Imogene Burn, PA-C  Probiotic Product (ALIGN PO) Take 1 capsule by mouth daily.     [provider]  spironolactone (ALDACTONE) 25 MG tablet Take 1 tablet (25 mg total) by mouth daily. Patient taking differently: Take 25 mg by mouth daily as needed (swelling).  11/01/17   [name], [name], MD  telmisartan (MICARDIS) 80 MG tablet Take 40 mg by mouth daily.     [provider]    Family History Family History  Problem Relation Age of Onset   Congestive Heart Failure Mother    Congestive Heart Failure Father    Osteoarthritis Sister    Prostate cancer Brother    Diabetes Brother    Anesthesia problems Neg Hx    Hypotension Neg Hx    Malignant hyperthermia Neg Hx    Pseudochol deficiency Neg Hx     Social History Social History   Tobacco Use   Smoking status: Former Smoker    Packs/day: 0.00    Years: 54.00    Pack years: 0.00    Types: Cigarettes    Quit date: 09/22/2017    Years since quitting: 1.9   Smokeless tobacco: Former Systems developer    Quit date: 11/17/2011  Substance Use Topics   Alcohol use: Not Currently    Alcohol/week: 1.0 standard drinks    Types: 1 Glasses of wine per week    Comment: couple glasses of wine occasionally-once a month   Drug use: No     Allergies   Bee venom and Latex   Review of Systems Review of Systems  Constitutional: Negative for chills, diaphoresis and fever.   Respiratory: Positive for chest tightness, shortness of breath and wheezing. Negative for cough.   Cardiovascular: Negative for palpitations and leg swelling.  Gastrointestinal: Negative  for abdominal pain, nausea and vomiting.  Musculoskeletal: Negative for myalgias.  Neurological: Negative for syncope.  All other systems reviewed and are negative.    Physical Exam Updated Vital Signs BP (!) 145/73    Pulse 67    Temp 97.8 F (36.6 C)    Resp (!) 21    SpO2 93%    Physical Exam Vitals signs and nursing note reviewed.  Constitutional:      Appearance: She is well-developed.  HENT:     Head: Normocephalic and atraumatic.  Eyes:     General:        Right eye: No discharge.        Left eye: No discharge.     Conjunctiva/sclera: Conjunctivae normal.     Pupils: Pupils are equal, round, and reactive to light.  Neck:     Musculoskeletal: Neck supple.  Cardiovascular:     Rate and Rhythm: Normal rate and regular rhythm.  Pulmonary:     Comments: Poor air movement throughout with tight breath sounds & biphasic wheezing. Mildly tachypneic. Speaking in short sentences. SpO2 94% on 3L via [place]. Per nursing staff was 76% on RA therefore O2 was applied.  Abdominal:     General: There is no distension.     Palpations: Abdomen is soft.     Tenderness: There is no abdominal tenderness.  Musculoskeletal:     Comments: 1+ symmetric lower leg edema w/o overlying erythema or tenderness to palpation.   Skin:    General: Skin is warm and dry.     Findings: No rash.  Neurological:     Mental Status: She is alert.     Comments: Clear speech.   Psychiatric:        Behavior: Behavior normal.      ED Treatments / Results  Labs (all labs ordered are listed, but only abnormal results are displayed) Labs Reviewed  BASIC METABOLIC PANEL - Abnormal; Notable for the following components:      Result Value   Glucose, Bld 240 (*)    BUN 39 (*)    Creatinine, Ser 1.81 (*)    GFR calc non Af  Amer 27 (*)    GFR calc Af Amer 31 (*)    All other components within normal limits  CBC WITH DIFFERENTIAL/PLATELET - Abnormal; Notable for the following components:   Platelets 419 (*)    Eosinophils Absolute 0.7 (*)    All other components within normal limits  BRAIN NATRIURETIC PEPTIDE - Abnormal; Notable for the following components:   B Natriuretic Peptide 298.0 (*)    All other components within normal limits  SARS CORONAVIRUS 2 BY RT PCR (HOSPITAL ORDER, [facility])  TROPONIN I (HIGH SENSITIVITY)  TROPONIN I (HIGH SENSITIVITY)    EKG EKG Interpretation  Date/Time:  Tuesday August 29 2019 18:05:44 EDT Ventricular Rate:  82 PR Interval:    QRS Duration: 135 QT Interval:  424 QTC Calculation: 496 R Axis:   -86 Text Interpretation:  Sinus rhythm Atrial premature complexes Prolonged PR interval IVCD, consider atypical RBBB LVH with IVCD, LAD and secondary repol abnrm Inferior infarct, acute (RCA) Anterolateral infarct, old Probable RV involvement, suggest recording right precordial leads >>> Acute MI <<< Confirmed by Nat Christen [phone]) on 08/29/2019 7:48:11 PM --> Discussed w/ supervising physician Dr. Lacinda Axon regarding EKG which he and I have each reviewed,  unable to amend computer interpretation of EKG- states not acute MI- somewhat abnormal EKG but similar to  prior which I am in agreement with.   Radiology Dg Chest Port 1 View  Result Date: 08/29/2019 CLINICAL DATA:  Shortness of breath. Low O2 sats. EXAM: PORTABLE CHEST 1 VIEW COMPARISON:  Chest radiograph 07/18/2018, CT chest 06/05/2019 FINDINGS: Stable cardiomediastinal contours with enlarged heart size. Unchanged position of a right chest ICD. Persistent elevation of the right hemidiaphragm. The lungs are clear. No pneumothorax or large pleural effusion. No acute finding in the visualized skeleton. IMPRESSION: No acute cardiopulmonary finding. Electronically Signed   By: Audie Pinto M.D.   On:  08/29/2019 19:42    Procedures .Critical Care Performed by: Amaryllis Dyke, PA-C Authorized by: Amaryllis Dyke, PA-C    CRITICAL CARE Performed by: Kennith Maes   Total critical care time: 30 minutes  Critical care time was exclusive of separately billable procedures and treating other patients.  Critical care was necessary to treat or prevent imminent or life-threatening deterioration.  Critical care was time spent personally by me on the following activities: development of treatment plan with patient and/or surrogate as well as nursing, discussions with consultants, evaluation of patient's response to treatment, examination of patient, obtaining history from patient or surrogate, ordering and performing treatments and interventions, ordering and review of laboratory studies, ordering and review of radiographic studies, pulse oximetry and re-evaluation of patient's condition.    (including critical care time)  Medications Ordered in ED Medications - No data to display   Initial Impression / Assessment and Plan / ED Course  I have reviewed the triage vital signs and the nursing notes.  Pertinent labs & imaging results that were available during my care of the patient were reviewed by me and considered in my medical decision making (see chart for details).    Patient presents to the ED w/ complaints of worsening dyspnea x 4 days with associated wheezing & chest tightness. Nontoxic appearing, hypoxic on RA w/ triage team improved on 3L via [place] but remains somewhat tachypneic and speaking in short sentences. BP elevated. Afebrile. Lungs w/ poor air movement & biphasic wheezing throughout. 1+ symmetric pitting edema. Plan for CXR, EKG & labs to include rapid covid swab to facilitate nebulizer tx as I suspect patient is having a COPD exacerbation, in the interim solumedrol, mag, & albuterol inhaler w/ spacer ordered. Additional DDX: CHF, pneumonia, ACS - would be  atypical given breath sounds, PE- would be unlikely given compliance on eliquis.   CBC: No anemia or leukocytosis BMP: Hyperglycemia without anion gap elevation or acidosis.  BUN/creatinine are elevated from her baseline. Troponin: 13 BNP: mildly elevated @ 298 however clear CXR Chest x-ray: No acute cardiopulmonary findings. EKG: Somewhat abnormal appearance, however reviewed with Dr. Lacinda Axon- appears fairly similar to prior.  COVID- negative  Atrovent neb x 1 & continuous albuterol nebs ordered - started @ 20:27 by RT  21:20: RE-EVAL: Patient feeling somewhat improved, air movement improved, remains with expiratory wheeze throughout, patient does not feel comfortable going home, feel she warrants admission for COPD exacerbation- will discuss w/ hospitalist service.   Findings and plan of care discussed with supervising physician Dr. Lacinda Axon who has evaluated the patient & is in agreement.   21:38: CONSULT: Discussed w/ hospitalist Dr. Denton Brick who accepts admission.   Tephanie Arch Sovine was evaluated in Emergency Department on 08/29/2019 for the symptoms described in the history of present illness. He/she was evaluated in the context of the global COVID-19 pandemic, which necessitated consideration that the patient might be at risk for infection  with the SARS-CoV-2 virus that causes COVID-19. Institutional protocols and algorithms that pertain to the evaluation of patients at risk for COVID-19 are in a state of rapid change based on information released by regulatory bodies including the CDC and federal and state organizations. These policies and algorithms were followed during the patient's care in the ED.  Final Clinical Impressions(s) / ED Diagnoses   Final diagnoses:  COPD exacerbation [facility])    ED Discharge Orders    None       Amaryllis Dyke, PA-C 08/29/19 2142    Nat Christen, MD 08/30/19 1546

## 2019-08-29 NOTE — Progress Notes (Signed)
Patient attempted 3x to use peak flow meter and was unable to use device.

## 2019-08-29 NOTE — H&P (Signed)
History and Physical    Brandi Hunter [id number] DOB: Apr 28, 1941 DOA: 08/29/2019  PCP: [name], FNP   Patient coming from: Home  I have personally briefly reviewed patient's old medical records in [place]  Chief Complaint: Difficulty breathing, wheezing  HPI: Brandi Hunter is a 78 y.o. female with medical history significant for asthma and COPD with chronic respiratory failure, atrial fibrillation, hx of AV block -pacemaker status, diabetes mellitus, CHF, depression.  Patient presented to the ED with complaints of difficulty breathing over the past 4 days.  She reports wheezing also.  She reports associated chest tightness without chest pain.  She denies cough.  No fevers no chills.  She reports compliance with her medications which includes Eliquis, Lasix.  Denies lower extremity swelling. She reports 2 episodes of vomiting earlier today small amount clear, no coffee grounds.  No abdominal pain no loose stools. She is on chronic 2 L O2 as needed, but she has had 2 increase her oxygen use.   08/18/2019 patient had cardioversion attempted, but failed.  Patient converted to sinus rhythm then reverted back to atrial fibrillation.  ED Course: O2 sats down to 76% on room air.  Creatinine 1.8, mild increased from baseline 1-1.5.  Troponin XIII.  BNP 298.  Portable chest x-ray negative for acute abnormality.  COVID-19 test negative.  125 mg Solu-Medrol given, 1 hour continuous nebs given.  Patient with diffuse biphasic rhonchi in the ED. With persistent dyspnea, and increased O2 requirement hospitalist to admit for COPD exacerbation.  Review of Systems: As per HPI all other systems reviewed and negative.  Past Medical History:  Diagnosis Date   Asthma    CAD (coronary artery disease)    a. catheterization in 03/2018 showing 90% ostial OM stenosis with medical management recommended at that time   Chronic anticoagulation    COPD (chronic obstructive pulmonary disease) (HCC)     DJD (degenerative joint disease)    Essential hypertension    GERD (gastroesophageal reflux disease)    History of home oxygen therapy    Hyperlipidemia    Mobitz (type) II atrioventricular block    Obesity    Pacemaker    Implanted by Dr [name] (MDT) 10/06/10   PAF (paroxysmal atrial fibrillation) ([place])    Pancreatitis 2010 OR 2011   Sleep apnea    CPAP   Type 2 diabetes mellitus ([place])     Past Surgical History:  Procedure Laterality Date   CARDIOVERSION N/A 08/18/2019   Procedure: CARDIOVERSION;  Surgeon: Lelon Perla, MD;  Location: [facility];  Service: Cardiovascular;  Laterality: N/A;   CATARACT EXTRACTION W/PHACO  11/02/2011   Procedure: CATARACT EXTRACTION PHACO AND INTRAOCULAR LENS PLACEMENT ([place]);  Surgeon: Williams Che;  Location: [facility];  Service: Ophthalmology;  Laterality: Right;  CDE=7.33   CATARACT EXTRACTION W/PHACO  12/07/2011   Procedure: CATARACT EXTRACTION PHACO AND INTRAOCULAR LENS PLACEMENT ([facility]);  Surgeon: Williams Che, MD;  Location: [facility];  Service: Ophthalmology;  Laterality: Left;  CDE 3.61   COLONOSCOPY WITH PROPOFOL N/A 08/09/2014   Procedure: COLONOSCOPY WITH PROPOFOL;  Surgeon: Juanita Craver, MD;  Location: [facility];  Service: Endoscopy;  Laterality: N/A;   CYSTOCELE REPAIR     ESOPHAGOGASTRODUODENOSCOPY (EGD) WITH PROPOFOL N/A 08/09/2014   Procedure: ESOPHAGOGASTRODUODENOSCOPY (EGD) WITH PROPOFOL;  Surgeon: Juanita Craver, MD;  [facility];  Service: Endoscopy;  Laterality: N/A;   EYE SURGERY  11/01/2012   BOTH EYES CATARACTS   INSERT / REPLACE /  REMOVE PACEMAKER  10/06/10   MDT  implanted by Dr [name]   KNEE ARTHROSCOPY     both   LEFT HEART CATH AND CORONARY ANGIOGRAPHY N/A 03/24/2018   Procedure: LEFT HEART CATH AND CORONARY ANGIOGRAPHY;  Surgeon: Lorretta Harp, MD;  Location: [facility];  Service: Cardiovascular;  Laterality: N/A;   OVARY SURGERY     removal   REPAIR RECTOCELE       SIMPLE MASTECTOMY WITH AXILLARY SENTINEL NODE BIOPSY Left 01/09/2015   Procedure: Irrigation and Drainage Abcess left Axilla;  Surgeon: Jackolyn Confer, MD;  Location: [facility];  Service: General;  Laterality: Left;   TONSILLECTOMY  AGE 9   TOTAL ABDOMINAL HYSTERECTOMY  1971     reports that she quit smoking about 23 months ago. Her smoking use included cigarettes. She smoked 0.00 packs per day for 54.00 years. She quit smokeless tobacco use about 7 years ago. She reports previous alcohol use of about 1.0 standard drinks of alcohol per week. She reports that she does not use drugs.  Allergies  Allergen Reactions   Bee Venom Swelling   Latex Swelling    LATEX CATHETERS    Family History  Problem Relation Age of Onset   Congestive Heart Failure Mother    Congestive Heart Failure Father    Osteoarthritis Sister    Prostate cancer Brother    Diabetes Brother    Anesthesia problems Neg Hx    Hypotension Neg Hx    Malignant hyperthermia Neg Hx    Pseudochol deficiency Neg Hx     Prior to Admission medications   Medication Sig Start Date End Date Taking? Authorizing Provider  albuterol (PROAIR HFA) 108 (90 BASE) MCG/ACT inhaler Inhale 2 puffs into the lungs every 4 (four) hours as needed for wheezing.  07/21/12   [provider]  amLODipine (NORVASC) 5 MG tablet Take 1 tablet (5 mg total) by mouth daily. 12/07/18 08/15/19  Satira Sark, MD  apixaban (ELIQUIS) 5 MG TABS tablet Take 1 tablet (5 mg total) by mouth 2 (two) times daily. 07/04/19   Satira Sark, MD  atorvastatin (LIPITOR) 40 MG tablet Take 40 mg by mouth daily.  03/17/13   [provider]  budesonide-formoterol (SYMBICORT) 160-4.5 MCG/ACT inhaler Inhale 2 puffs into the lungs 2 (two) times daily.    [provider]  fenofibrate 160 MG tablet Take 160 mg by mouth every evening.    [provider]  furosemide (LASIX) 20 MG tablet Take 1 tablet (20 mg total) by mouth daily.  12/07/18 12/07/19  Satira Sark, MD  glipiZIDE (GLUCOTROL XL) 10 MG 24 hr tablet Take 20 mg by mouth daily with breakfast.  08/20/13   [provider]  ipratropium-albuterol (DUONEB) 0.5-2.5 (3) MG/3ML SOLN Take 3 mLs by nebulization 2 (two) times daily.    [provider]  isosorbide mononitrate (IMDUR) 30 MG 24 hr tablet Take 1 tablet (30 mg total) by mouth 2 (two) times daily. 08/03/19 08/02/20  Satira Sark, MD  LORazepam (ATIVAN) 0.5 MG tablet Take 0.25-0.5 mg by mouth 2 (two) times daily as needed for anxiety.  04/28/13   [provider]  metFORMIN (GLUCOPHAGE) 1000 MG tablet Take 1 tablet (1,000 mg total) by mouth 2 (two) times daily with a meal. 03/27/18   Isaiah Serge, NP  metoprolol succinate (TOPROL-XL) 100 MG 24 hr tablet TAKE 1 TABLET BY MOUTH ONCE DAILY Patient taking differently: Take 100 mg by mouth daily.  11/18/18   [name], [name], MD  nitroGLYCERIN (NITROSTAT) 0.4 MG SL tablet DISSOLVE ONE TABLET UNDER THE TONGUE EVERY 5 MINUTES AS NEEDED FOR CHEST PAIN.  DO NOT EXCEED A TOTAL OF 3 DOSES IN 15 MINUTES Patient taking differently: Place 0.4 mg under the tongue every 5 (five) minutes as needed for chest pain.  07/04/19   Satira Sark, MD  potassium chloride SA (KLOR-CON M20) 20 MEQ tablet Take 1 tablet (20 mEq total) by mouth daily. Patient not taking: Reported on 08/15/2019 12/26/18 08/03/19  Imogene Burn, PA-C  Probiotic Product (ALIGN PO) Take 1 capsule by mouth daily.     [provider]  spironolactone (ALDACTONE) 25 MG tablet Take 1 tablet (25 mg total) by mouth daily. Patient taking differently: Take 25 mg by mouth daily as needed (swelling).  11/01/17   [name], [name], MD  telmisartan (MICARDIS) 80 MG tablet Take 40 mg by mouth daily.     [provider]    Physical Exam: Vitals:   08/29/19 2028 08/29/19 2030 08/29/19 2100 08/29/19 2130  BP:  (!) 185/87 (!) 145/73 130/70  Pulse:  71 67 72  Resp:  20 (!) 21 14    Temp:      SpO2: 98% 97% 93% 100%    Constitutional: calm, comfortable Vitals:   08/29/19 2028 08/29/19 2030 08/29/19 2100 08/29/19 2130  BP:  (!) 185/87 (!) 145/73 130/70  Pulse:  71 67 72  Resp:  20 (!) 21 14  Temp:      SpO2: 98% 97% 93% 100%   Eyes: PERRL, lids and conjunctivae normal ENMT: Mucous membranes are moist. Posterior pharynx clear of any exudate or lesions.Normal dentition.  Neck: normal, supple, no masses, no thyromegaly Respiratory: Diffuse expiratory and inspiratory rhonchi, Normal respiratory effort. No accessory muscle use.  Cardiovascular: Regular rate and rhythm, no murmurs / rubs / gallops.  Trace bilateral extremity edema. 2+ pedal pulses.  Abdomen: no tenderness, no masses palpated. No hepatosplenomegaly. Bowel sounds positive.  Musculoskeletal: no clubbing / cyanosis. No joint deformity upper and lower extremities. Good ROM, no contractures. Normal muscle tone.  Skin: no rashes, lesions, ulcers. No induration Neurologic: CN 2-12 grossly intact. Strength 5/5 in all 4.  Psychiatric: Normal judgment and insight. Alert and oriented x 3. Normal mood.   Labs on Admission: I have personally reviewed following labs and imaging studies  CBC: Recent Labs  Lab 08/29/19 1833  WBC 9.1  NEUTROABS 6.5  HGB 13.2  HCT 43.5  MCV 93.5  PLT 123XX123*   Basic Metabolic Panel: Recent Labs  Lab 08/29/19 1833  NA 139  K 4.9  CL 101  CO2 25  GLUCOSE 240*  BUN 39*  CREATININE 1.81*  CALCIUM 9.5    Radiological Exams on Admission: Dg Chest Port 1 View  Result Date: 08/29/2019 CLINICAL DATA:  Shortness of breath. Low O2 sats. EXAM: PORTABLE CHEST 1 VIEW COMPARISON:  Chest radiograph 07/18/2018, CT chest 06/05/2019 FINDINGS: Stable cardiomediastinal contours with enlarged heart size. Unchanged position of a right chest ICD. Persistent elevation of the right hemidiaphragm. The lungs are clear. No pneumothorax or large pleural effusion. No acute finding in the  visualized skeleton. IMPRESSION: No acute cardiopulmonary finding. Electronically Signed   By: Audie Pinto M.D.   On: 08/29/2019 19:42    EKG: Independently reviewed.  Ventricular paced rhythm.  Significant change from prior.  Assessment/Plan Active Problems:   COPD exacerbation (HCC)    COPD/asthma exacerbation with acute on chronic hypoxic respiratory  failure-dyspnea, rhonchi on exam.  Portable chest x-ray negative for acute abnormality.  COVID-19 negative.  Currently on 3 L nasal cannula with sats > 93%.  Compliant with Eliquis. -Continue IV Solu-Medrol 60 every 12 hourly -Doxycycline IV 100 twice daily -Mucolytics PRN, supplemental O2 -DuoNebs PRN, scheduled -Resume home Symbicort as Dulera  Mild Acute kidney injury on chronic kidney disease 3 -creatinine elevated 1.8, but not far from baseline 1-1.5. -Hold home Lasix and ARB -Gentle hydration 75cc/hr x 12hrs.  -BMP a.m.  Atrial fibrillation, pacemaker status- failed cardioversion 10/2.  EKG shows ventricular paced rhythm.  Compliant with anticoagulation. -Resume home Eliquis, metoprolol.  Diabetes mellitus-random glucose 240. -Monitor glucose closely while on steroids - SSI,  -Resume home glipizide -Hold home metformin  Diastolic CHF-stable and compensated.  Last echo 07/2018 EF 55 to 60%. -Hold home Lasix for now with mild AKI. -Continue home metoprolol, Imdur, statins,  Hypertension-systolic [id number] to [id number]. -Resume home Norvasc, metoprolol, spironolactone -Hold arms and Lasix for now  Depression-stable. -Ports compliance with Ativan 0.5 mg nightly, will resume   DVT prophylaxis: Eliquis Code Status: Full code Family Communication: None at bedside  Disposition Plan: 1 to 2 days Consults called: None Admission status: Observation, telemetry.   Bethena Roys MD [facility]  08/29/2019, 10:12 PM

## 2019-08-29 NOTE — ED Notes (Signed)
Called [facility] for doxycycline

## 2019-08-29 NOTE — ED Triage Notes (Signed)
Patient complains of shortness of breath that started Saturday. Patient states history of COPD and Afib. Had cardioversion last week that was unsuccessful.  Pt labored on room air in triage. O2 stat was 86%. Patient taken to room and O2 stat dropped 76 %. Patient placed on 3L O2 and stat rose to 94%.

## 2019-08-30 ENCOUNTER — Encounter (HOSPITAL_COMMUNITY): Payer: Self-pay

## 2019-08-30 DIAGNOSIS — E1142 Type 2 diabetes mellitus with diabetic polyneuropathy: Secondary | ICD-10-CM | POA: Diagnosis present

## 2019-08-30 DIAGNOSIS — I5032 Chronic diastolic (congestive) heart failure: Secondary | ICD-10-CM | POA: Diagnosis not present

## 2019-08-30 DIAGNOSIS — E1122 Type 2 diabetes mellitus with diabetic chronic kidney disease: Secondary | ICD-10-CM | POA: Diagnosis not present

## 2019-08-30 DIAGNOSIS — J9621 Acute and chronic respiratory failure with hypoxia: Principal | ICD-10-CM

## 2019-08-30 DIAGNOSIS — I441 Atrioventricular block, second degree: Secondary | ICD-10-CM | POA: Diagnosis present

## 2019-08-30 DIAGNOSIS — I2510 Atherosclerotic heart disease of native coronary artery without angina pectoris: Secondary | ICD-10-CM | POA: Diagnosis not present

## 2019-08-30 DIAGNOSIS — N179 Acute kidney failure, unspecified: Secondary | ICD-10-CM | POA: Diagnosis present

## 2019-08-30 DIAGNOSIS — I2583 Coronary atherosclerosis due to lipid rich plaque: Secondary | ICD-10-CM | POA: Diagnosis present

## 2019-08-30 DIAGNOSIS — E782 Mixed hyperlipidemia: Secondary | ICD-10-CM | POA: Diagnosis present

## 2019-08-30 DIAGNOSIS — K760 Fatty (change of) liver, not elsewhere classified: Secondary | ICD-10-CM | POA: Diagnosis not present

## 2019-08-30 DIAGNOSIS — I482 Chronic atrial fibrillation, unspecified: Secondary | ICD-10-CM | POA: Diagnosis present

## 2019-08-30 DIAGNOSIS — J441 Chronic obstructive pulmonary disease with (acute) exacerbation: Secondary | ICD-10-CM | POA: Diagnosis not present

## 2019-08-30 DIAGNOSIS — E1165 Type 2 diabetes mellitus with hyperglycemia: Secondary | ICD-10-CM | POA: Diagnosis not present

## 2019-08-30 DIAGNOSIS — Z9981 Dependence on supplemental oxygen: Secondary | ICD-10-CM | POA: Diagnosis not present

## 2019-08-30 DIAGNOSIS — I130 Hypertensive heart and chronic kidney disease with heart failure and stage 1 through stage 4 chronic kidney disease, or unspecified chronic kidney disease: Secondary | ICD-10-CM | POA: Diagnosis present

## 2019-08-30 DIAGNOSIS — Z20828 Contact with and (suspected) exposure to other viral communicable diseases: Secondary | ICD-10-CM | POA: Diagnosis present

## 2019-08-30 DIAGNOSIS — J9620 Acute and chronic respiratory failure, unspecified whether with hypoxia or hypercapnia: Secondary | ICD-10-CM | POA: Diagnosis present

## 2019-08-30 DIAGNOSIS — R0602 Shortness of breath: Secondary | ICD-10-CM | POA: Diagnosis present

## 2019-08-30 DIAGNOSIS — F339 Major depressive disorder, recurrent, unspecified: Secondary | ICD-10-CM | POA: Diagnosis present

## 2019-08-30 DIAGNOSIS — N183 Chronic kidney disease, stage 3 unspecified: Secondary | ICD-10-CM | POA: Diagnosis not present

## 2019-08-30 LAB — GLUCOSE, CAPILLARY
Glucose-Capillary: 266 mg/dL — ABNORMAL HIGH (ref 70–99)
Glucose-Capillary: 285 mg/dL — ABNORMAL HIGH (ref 70–99)
Glucose-Capillary: 194 mg/dL — ABNORMAL HIGH (ref 70–99)
Glucose-Capillary: 148 mg/dL — ABNORMAL HIGH (ref 70–99)

## 2019-08-30 LAB — BASIC METABOLIC PANEL
Anion gap: 13 (ref 5–15)
BUN: 43 mg/dL — ABNORMAL HIGH (ref 8–23)
CO2: 25 mmol/L (ref 22–32)
Calcium: 9.1 mg/dL (ref 8.9–10.3)
Chloride: 100 mmol/L (ref 98–111)
Creatinine, Ser: 1.71 mg/dL — ABNORMAL HIGH (ref 0.44–1.00)
GFR calc Af Amer: 33 mL/min — ABNORMAL LOW (ref >60)
GFR calc non Af Amer: 29 mL/min — ABNORMAL LOW (ref >60)
Glucose, Bld: 306 mg/dL — ABNORMAL HIGH (ref 70–99)
Potassium: 4.5 mmol/L (ref 3.5–5.1)
Sodium: 138 mmol/L (ref 135–145)

## 2019-08-30 LAB — HEMOGLOBIN A1C
Hgb A1c MFr Bld: 6.2 % — ABNORMAL HIGH (ref 4.8–5.6)
Mean Plasma Glucose: 131.24 mg/dL

## 2019-08-30 MED ORDER — INSULIN ASPART 100 UNIT/ML ~~LOC~~ SOLN
6.0000 [IU] | Freq: Three times a day (TID) | SUBCUTANEOUS | Status: DC
  Administered 2019-08-30 – 2019-08-31 (×3): 6 [IU] via SUBCUTANEOUS

## 2019-08-30 MED ORDER — IPRATROPIUM-ALBUTEROL 0.5-2.5 (3) MG/3ML IN SOLN
3.0000 mL | Freq: Three times a day (TID) | RESPIRATORY_TRACT | Status: DC
  Administered 2019-08-31: 10:00:00 3 mL via RESPIRATORY_TRACT
  Filled 2019-08-30 (×2): qty 3

## 2019-08-30 NOTE — Progress Notes (Signed)
PROGRESS NOTE  Brandi Hunter  [id number]  DOB: [birth year]  DOA: 08/29/2019 PCP: [name], FNP  Brief Admission Hx: 78 y.o. female with medical history significant for asthma and COPD with chronic respiratory failure, atrial fibrillation, hx of AV block -pacemaker status, diabetes mellitus, CHF, depression.  Patient presented to the ED with complaints of difficulty breathing over the past 4 days.  She reports wheezing also.   MDM/Assessment & Plan:   1. Acute on chronic respiratory failure with hypoxia secondary to COPD exacerbation-patient remains short of breath and has not been ambulating well.  Continue current treatments and plan to try to ambulate more today.  Continue supplemental oxygen.  Continue IV steroids mucolytic's bronchodilators and follow closely.  Patient not stable to discharge home at this time. 2. AKI on CKD stage III-creatinine improved with gentle hydration. 3. Type 2 diabetes mellitus with hyperglycemia-continue supplemental sliding scale coverage and continue prandial coverage as ordered.  Continue to follow CBG closely.  DC glipizide. 4. Diastolic CHF-stable compensated and has been resumed on home metoprolol Imdur and statins. 5. Essential hypertension-resume home blood pressure medications.  Follow. 6. Depression-continue home medications. 7. Chronic atrial fibrillation-continue apixaban and metoprolol for rate control.  DVT prophylaxis: Apixaban Code Status: Full Family Communication: Patient updated at bedside, verbalized understanding Disposition Plan: Continue inpatient treatments  Consultants:    Procedures:    Antimicrobials:     Subjective: Patient reports that she remains short of breath with cough and chest congestion.  Objective: Vitals:   08/30/19 0521 08/30/19 0733 08/30/19 0839 08/30/19 1334  BP: (!) 145/68 (!) 144/61  (!) 127/55  Pulse: 80 75  90  Resp: 18   19  Temp: 97.6 F (36.4 C)   98.2 F (36.8 C)  TempSrc:  Oral   Oral  SpO2: 94% 96% 96% 97%  Weight:      Height:        Intake/Output Summary (Last 24 hours) at 08/30/2019 1422 Last data filed at 08/30/2019 1100 Gross per 24 hour  Intake 915.9 ml  Output -  Net 915.9 ml   Filed Weights   08/29/19 2313  Weight: 101 kg   REVIEW OF SYSTEMS  As per history otherwise all reviewed and reported negative  Exam:  General exam: Awake, alert, no apparent distress, cooperative and pleasant Respiratory system: Poor air movement especially at the bases.  Mild to moderate increased work of breathing. Cardiovascular system: S1 & S2 heard. No JVD, murmurs, gallops, clicks or pedal edema. Gastrointestinal system: Abdomen is nondistended, soft and nontender. Normal bowel sounds heard. Central nervous system: Alert and oriented. No focal neurological deficits. Extremities: no CCE.  Data Reviewed: Basic Metabolic Panel: Recent Labs  Lab 08/29/19 1833 08/30/19 0449  NA 139 138  K 4.9 4.5  CL 101 100  CO2 25 25  GLUCOSE 240* 306*  BUN 39* 43*  CREATININE 1.81* 1.71*  CALCIUM 9.5 9.1   Liver Function Tests: No results for input(s): AST, ALT, ALKPHOS, BILITOT, PROT, ALBUMIN in the last 168 hours. No results for input(s): LIPASE, AMYLASE in the last 168 hours. No results for input(s): AMMONIA in the last 168 hours. CBC: Recent Labs  Lab 08/29/19 1833  WBC 9.1  NEUTROABS 6.5  HGB 13.2  HCT 43.5  MCV 93.5  PLT 419*   Cardiac Enzymes: No results for input(s): CKTOTAL, CKMB, CKMBINDEX, TROPONINI in the last 168 hours. CBG (last 3)  Recent Labs    08/29/19 2309 08/30/19 0727 08/30/19 1108  GLUCAP  223* 266* 285*   Recent Results (from the past 240 hour(s))  SARS Coronavirus 2 by RT PCR (hospital order, performed in [facility] hospital lab) Nasopharyngeal Nasopharyngeal Swab     Status: None   Collection Time: 08/29/19  6:18 PM   Specimen: Nasopharyngeal Swab  Result Value Ref Range Status   SARS Coronavirus 2 NEGATIVE NEGATIVE  Final    Comment: (NOTE) If result is NEGATIVE SARS-CoV-2 target nucleic acids are NOT DETECTED. The SARS-CoV-2 RNA is generally detectable in upper and lower  respiratory specimens during the acute phase of infection. The lowest  concentration of SARS-CoV-2 viral copies this assay can detect is 250  copies / mL. A negative result does not preclude SARS-CoV-2 infection  and should not be used as the sole basis for treatment or other  patient management decisions.  A negative result may occur with  improper specimen collection / handling, submission of specimen other  than nasopharyngeal swab, presence of viral mutation(s) within the  areas targeted by this assay, and inadequate number of viral copies  (<250 copies / mL). A negative result must be combined with clinical  observations, patient history, and epidemiological information. If result is POSITIVE SARS-CoV-2 target nucleic acids are DETECTED. The SARS-CoV-2 RNA is generally detectable in upper and lower  respiratory specimens dur ing the acute phase of infection.  Positive  results are indicative of active infection with SARS-CoV-2.  Clinical  correlation with patient history and other diagnostic information is  necessary to determine patient infection status.  Positive results do  not rule out bacterial infection or co-infection with other viruses. If result is PRESUMPTIVE POSTIVE SARS-CoV-2 nucleic acids MAY BE PRESENT.   A presumptive positive result was obtained on the submitted specimen  and confirmed on repeat testing.  While 2019 novel coronavirus  (SARS-CoV-2) nucleic acids may be present in the submitted sample  additional confirmatory testing may be necessary for epidemiological  and / or clinical management purposes  to differentiate between  SARS-CoV-2 and other Sarbecovirus currently known to infect humans.  If clinically indicated additional testing with an alternate test  methodology [phone]) is advised. The  SARS-CoV-2 RNA is generally  detectable in upper and lower respiratory sp ecimens during the acute  phase of infection. The expected result is Negative. Fact Sheet for Patients:  StrictlyIdeas.no Fact Sheet for Healthcare Providers: BankingDealers.co.za This test is not yet approved or cleared by the [place] FDA and has been authorized for detection and/or diagnosis of SARS-CoV-2 by FDA under an Emergency Use Authorization (EUA).  This EUA will remain in effect (meaning this test can be used) for the duration of the COVID-19 declaration under Section 564(b)(1) of the Act, 21 U.S.C. section 360bbb-3(b)(1), unless the authorization is terminated or revoked sooner. Performed at [facility], [address] [place], [place] [address]      Studies: Dg Chest [facility] 1 View  Result Date: 08/29/2019 CLINICAL DATA:  Shortness of breath. Low O2 sats. EXAM: PORTABLE CHEST 1 VIEW COMPARISON:  Chest radiograph 07/18/2018, CT chest 06/05/2019 FINDINGS: Stable cardiomediastinal contours with enlarged heart size. Unchanged position of a right chest ICD. Persistent elevation of the right hemidiaphragm. The lungs are clear. No pneumothorax or large pleural effusion. No acute finding in the visualized skeleton. IMPRESSION: No acute cardiopulmonary finding. Electronically Signed   By: Audie Pinto M.D.   On: 08/29/2019 19:42   Scheduled Meds: . amLODipine  5 mg Oral Daily  . apixaban  5 mg Oral BID  .  atorvastatin  40 mg Oral q1800  . glipiZIDE  20 mg Oral Q breakfast  . insulin aspart  0-15 Units Subcutaneous TID WC  . insulin aspart  0-5 Units Subcutaneous QHS  . insulin aspart  6 Units Subcutaneous TID WC  . ipratropium-albuterol  3 mL Nebulization Q6H  . isosorbide mononitrate  30 mg Oral BID  . LORazepam  0.5 mg Oral QHS  . methylPREDNISolone (SOLU-MEDROL) injection  60 mg Intravenous Q12H  . metoprolol succinate  100 mg Oral Daily  .  mometasone-formoterol  2 puff Inhalation BID  . spironolactone  25 mg Oral Daily   Continuous Infusions: . sodium chloride Stopped (08/29/19 1953)  . doxycycline (VIBRAMYCIN) IV 100 mg (08/30/19 1029)   Active Problems:   COPD exacerbation ([place])   Acute and chronic respiratory failure (acute-on-chronic) ([place])  Time spent:   Irwin Brakeman, MD [facility] 08/30/2019, 2:22 PM    LOS: 0 days  How to contact the [facility] Attending or Consulting provider [place] or covering provider during after hours [place], for this patient?  1. Check the care team in [facility] and look for a) attending/consulting [facility] provider listed and b) the [facility] team listed 2. Log into [email or web address] and use [place]'s universal password to access. If you do not have the password, please contact the hospital operator. 3. Locate the [facility] provider you are looking for under [facility] and page to a number that you can be directly reached. 4. If you still have difficulty reaching the provider, please page the [facility] (Director on Call) for the Hospitalists listed on amion for assistance.

## 2019-08-30 NOTE — Evaluation (Addendum)
Physical Therapy Evaluation Patient Details Name: Brandi Hunter MRN: [id number] DOB: January 10, 1941 Today's Date: 08/30/2019   History of Present Illness  Brandi Hunter is a 78 y.o. female with medical history significant for asthma and COPD with chronic respiratory failure, atrial fibrillation, hx of AV block -pacemaker status, diabetes mellitus, CHF, depression.  Patient presented to the ED with complaints of difficulty breathing over the past 4 days.  She reports wheezing also.  She reports associated chest tightness without chest pain.  She denies cough.  No fevers no chills.  She reports compliance with her medications which includes Eliquis, Lasix.  Denies lower extremity swelling. She is on chronic 2 L O2 as needed, but she has had 2 increase her oxygen use.    Clinical Impression  Patient limited for functional mobility as stated below secondary to BLE weakness, fatigue and mild difficulty with dynamic standing balance. Patient able to complete bed mobility and transfers with assist listed below. She is able to ambulate with IV pole with slow gait and minimal SOB. She requires mild verbal cueing for navigation of IV pole around obstacles. Patient most limited secondary to fatigue and activity intolerance. Patient educated about importance of using AD at home/when ambulating to reduce the risk of falls. Patient left in chair - RN notified.  Patient will benefit from continued physical therapy in hospital and recommended venue below to increase strength, balance, endurance for safe ADLs and gait.     Follow Up Recommendations No PT follow up;Supervision - Intermittent;Supervision for mobility/OOB    Equipment Recommendations  Rolling walker with 5" wheels    Recommendations for Other Services       Precautions / Restrictions Precautions Precautions: Fall Restrictions Weight Bearing Restrictions: No      Mobility  Bed Mobility Overal bed mobility: Modified Independent              General bed mobility comments: slow, on 2L O2 SpO2 91%  Transfers Overall transfer level: Modified independent   Transfers: Sit to/from Stand Sit to Stand: Modified independent (Device/Increase time)         General transfer comment: slow, used IV pole upon standing  Ambulation/Gait Ambulation/Gait assistance: Modified independent (Device/Increase time) Gait Distance (Feet): 100 Feet Assistive device: IV Pole Gait Pattern/deviations: Step-through pattern;Decreased step length - right;Decreased step length - left Gait velocity: decreased   General Gait Details: slow, minimal SOB, on 2L O2 SpO2 94%  Stairs            Wheelchair Mobility    Modified Rankin (Stroke Patients Only)       Balance Overall balance assessment: Modified Independent Sitting-balance support: Feet supported;No upper extremity supported Sitting balance-Leahy Scale: Good Sitting balance - Comments: seated at bedside     Standing balance-Leahy Scale: Fair Standing balance comment: using IV pole, minimally impaired dynamic balance with gait and turns                             Pertinent Vitals/Pain Pain Assessment: No/denies pain    Home Living Family/patient expects to be discharged to:: Private residence Living Arrangements: Alone Available Help at Discharge: Family Type of Home: House Home Access: Ramped entrance     Home Layout: One level Home Equipment: Tub bench;Cane - single point;Grab bars - tub/shower;Walker - 4 wheels;Walker - 2 wheels      Prior Function Level of Independence: Independent with assistive device(s)  Comments: Patient states she is independent but uses household furniture and walls occasionally to ambulate with. She is a household Actor. She drives but uses electric scooter in stores for longer distances.     Hand Dominance        Extremity/Trunk Assessment   Upper Extremity Assessment Upper  Extremity Assessment: Overall WFL for tasks assessed    Lower Extremity Assessment Lower Extremity Assessment: Generalized weakness       Communication   Communication: No difficulties  Cognition Arousal/Alertness: Awake/alert Behavior During Therapy: WFL for tasks assessed/performed Overall Cognitive Status: Within Functional Limits for tasks assessed                                        General Comments      Exercises     Assessment/Plan    PT Assessment Patient needs continued PT services  PT Problem List Decreased strength;Decreased activity tolerance;Decreased balance;Decreased mobility       PT Treatment Interventions DME instruction;Gait training;Functional mobility training;Therapeutic activities;Therapeutic exercise;Balance training;Patient/family education    PT Goals (Current goals can be found in the Care Plan section)  Acute Rehab PT Goals Patient Stated Goal: go home PT Goal Formulation: With patient Time For Goal Achievement: 09/13/19 Potential to Achieve Goals: Good    Frequency Min 3X/week   Barriers to discharge        Co-evaluation               AM-PAC PT "6 Clicks" Mobility  Outcome Measure Help needed turning from your back to your side while in a flat bed without using bedrails?: None Help needed moving from lying on your back to sitting on the side of a flat bed without using bedrails?: None Help needed moving to and from a bed to a chair (including a wheelchair)?: None Help needed standing up from a chair using your arms (e.g., wheelchair or bedside chair)?: None Help needed to walk in hospital room?: A Little Help needed climbing 3-5 steps with a railing? : A Lot 6 Click Score: 21    End of Session Equipment Utilized During Treatment: Gait belt;Oxygen(2 L O2) Activity Tolerance: Patient tolerated treatment well;Patient limited by fatigue Patient left: in chair;with call bell/phone within reach Nurse  Communication: Mobility status PT Visit Diagnosis: Unsteadiness on feet (R26.81);Muscle weakness (generalized) (M62.81);Other abnormalities of gait and mobility (R26.89)    Time: [id number] PT Time Calculation (min) (ACUTE ONLY): 37 min   Charges:   PT Evaluation $PT Eval Low Complexity: 1 Low PT Treatments $Gait Training: 8-22 mins $Therapeutic Activity: 8-22 mins        4:14 PM, 08/30/19 Mearl Latin PT, DPT Physical Therapist at [facility]

## 2019-08-30 NOTE — Plan of Care (Signed)
  Problem: Education: Goal: Knowledge of General Education information will improve Description: Including pain rating scale, medication(s)/side effects and non-pharmacologic comfort measures Outcome: Progressing   Problem: Health Behavior/Discharge Planning: Goal: Ability to manage health-related needs will improve Outcome: Progressing   Problem: Clinical Measurements: Goal: Ability to maintain clinical measurements within normal limits will improve Outcome: Progressing Goal: Will remain free from infection Outcome: Progressing Goal: Diagnostic test results will improve Outcome: Progressing Goal: Respiratory complications will improve Outcome: Progressing Goal: Cardiovascular complication will be avoided Outcome: Progressing   Problem: Activity: Goal: Risk for activity intolerance will decrease Outcome: Progressing   Problem: Nutrition: Goal: Adequate nutrition will be maintained Outcome: Progressing   Problem: Coping: Goal: Level of anxiety will decrease Outcome: Progressing   Problem: Elimination: Goal: Will not experience complications related to bowel motility Outcome: Progressing Goal: Will not experience complications related to urinary retention Outcome: Progressing   Problem: Pain Managment: Goal: General experience of comfort will improve Outcome: Progressing   Problem: Safety: Goal: Ability to remain free from injury will improve Outcome: Progressing   Problem: Skin Integrity: Goal: Risk for impaired skin integrity will decrease Outcome: Progressing   Problem: Acute Rehab PT Goals(only PT should resolve) Goal: Pt Will Ambulate Outcome: Progressing Flowsheets (Taken 08/30/2019 1609) Pt will Ambulate:  > 125 feet  with modified independence Goal: Pt/caregiver will Perform Home Exercise Program Outcome: Progressing Flowsheets (Taken 08/30/2019 1609) Pt/caregiver will Perform Home Exercise Program: Independently   4:11 PM, 08/30/19 Mearl Latin PT, DPT Physical Therapist at [facility]

## 2019-08-31 DIAGNOSIS — J9621 Acute and chronic respiratory failure with hypoxia: Secondary | ICD-10-CM | POA: Diagnosis not present

## 2019-08-31 DIAGNOSIS — R0602 Shortness of breath: Secondary | ICD-10-CM | POA: Diagnosis not present

## 2019-08-31 DIAGNOSIS — J441 Chronic obstructive pulmonary disease with (acute) exacerbation: Secondary | ICD-10-CM | POA: Diagnosis not present

## 2019-08-31 LAB — GLUCOSE, CAPILLARY
Glucose-Capillary: 231 mg/dL — ABNORMAL HIGH (ref 70–99)
Glucose-Capillary: 180 mg/dL — ABNORMAL HIGH (ref 70–99)

## 2019-08-31 MED ORDER — DOXYCYCLINE HYCLATE 100 MG PO TABS: 100.0000 mg | ORAL_TABLET | Freq: Two times a day (BID) | ORAL | 0 refills | Status: AC

## 2019-08-31 MED ORDER — IPRATROPIUM-ALBUTEROL 0.5-2.5 (3) MG/3ML IN SOLN: 3.0000 mL | Freq: Four times a day (QID) | RESPIRATORY_TRACT | 1 refills | Status: AC

## 2019-08-31 MED ORDER — SPIRONOLACTONE 25 MG PO TABS: 25.0000 mg | ORAL_TABLET | Freq: Every day | ORAL | Status: DC | PRN

## 2019-08-31 MED ORDER — METHYLPREDNISOLONE SODIUM SUCC 40 MG IJ SOLR
40.0000 mg | Freq: Two times a day (BID) | INTRAMUSCULAR | Status: DC
  Administered 2019-08-31: 09:00:00 40 mg via INTRAVENOUS
  Filled 2019-08-31: qty 1

## 2019-08-31 MED ORDER — DOXYCYCLINE HYCLATE 100 MG PO TABS
100.0000 mg | ORAL_TABLET | Freq: Two times a day (BID) | ORAL | Status: DC
  Administered 2019-08-31: 09:00:00 100 mg via ORAL
  Filled 2019-08-31: qty 1

## 2019-08-31 MED ORDER — PREDNISONE 20 MG PO TABS: ORAL_TABLET | ORAL | 0 refills | Status: DC

## 2019-08-31 NOTE — Discharge Summary (Signed)
Physician Discharge Summary  Brandi Hunter [id number] DOB: [birth year] DOA: 08/29/2019  PCP: [name], [place] [facility]: Dennard Schaumann date: 08/29/2019 Discharge date: 08/31/2019  Admitted From: Home  Disposition: Home   Recommendations for Outpatient Follow-up:  1. Follow up with PCP in 1 weeks 2. Follow up with cardiology clinic tomorrow as scheduled  Discharge Condition: STABLE   CODE STATUS: FULL    Brief Hospitalization Summary: Please see all hospital notes, images, labs for full details of the hospitalization. Dr. Talmadge Coventry HPI: Brandi Hunter is a 78 y.o. female with medical history significant for asthma and COPD with chronic respiratory failure, atrial fibrillation, hx of AV block -pacemaker status, diabetes mellitus, CHF, depression.  Patient presented to the ED with complaints of difficulty breathing over the past 4 days.  She reports wheezing also.  She reports associated chest tightness without chest pain.  She denies cough.  No fevers no chills.  She reports compliance with her medications which includes Eliquis, Lasix.  Denies lower extremity swelling. She reports 2 episodes of vomiting earlier today small amount clear, no coffee grounds.  No abdominal pain no loose stools. She is on chronic 2 L O2 as needed, but she has had 2 increase her oxygen use.   08/18/2019 patient had cardioversion attempted, but failed.  Patient converted to sinus rhythm then reverted back to atrial fibrillation.  ED Course: O2 sats down to 76% on room air.  Creatinine 1.8, mild increased from baseline 1-1.5.  Troponin XIII.  BNP 298.  Portable chest x-ray negative for acute abnormality.  COVID-19 test negative.  125 mg Solu-Medrol given, 1 hour continuous nebs given.  Patient with diffuse biphasic rhonchi in the ED. With persistent dyspnea, and increased O2 requirement hospitalist to admit for COPD exacerbation.  Brief Admission Hx: 78 y.o.femalewith medical history significant  forasthma andCOPD with chronic respiratory failure, atrial fibrillation,hx ofAV block -pacemaker status,diabetes mellitus, CHF, depression. Patient presented to the ED with complaints of difficulty breathing over the past 4 days. She reports wheezing also.   MDM/Assessment & Plan:   1. Acute on chronic respiratory failure with hypoxia secondary to COPD exacerbation-patient feels better, SOB resolved and has been ambulating well.  Continue steroids and antibiotics treatments and plan to DC home today.  Continue supplemental oxygen.  Patient stable to discharge home at this time.  Tapered steroids ordered at DC.  2. AKI on CKD stage III-creatinine improved with gentle hydration. 3. Type 2 diabetes mellitus with hyperglycemia-in hospital was treated with supplemental sliding scale coverage and continue prandial coverage as ordered.  Continue to follow CBG closely.  resume home therapy at discharge.  4. Diastolic CHF-stable compensated and has been resumed on home metoprolol Imdur and statins. 5. Essential hypertension-resume home blood pressure medications.  Follow. 6. Depression-continue home medications. 7. Chronic atrial fibrillation-continue apixaban and metoprolol for rate control.  DVT prophylaxis: Apixaban Code Status: Full Family Communication: Patient updated at bedside, verbalized understanding Disposition Plan: Pt feels well enough to manage at home, will DC home with close outpatient follow up  Discharge Diagnoses:  Active Problems:   COPD exacerbation ([place])   Acute and chronic respiratory failure (acute-on-chronic) ([place])   Discharge Instructions:  Allergies as of 08/31/2019      Reactions   Bee Venom Swelling   Latex Swelling   LATEX CATHETERS      Medication List    TAKE these medications   ALIGN PO Take 1 capsule by mouth daily.   amLODipine 5 MG tablet Commonly  known as: NORVASC Take 1 tablet (5 mg total) by mouth daily.   apixaban 5 MG Tabs  tablet Commonly known as: Eliquis Take 1 tablet (5 mg total) by mouth 2 (two) times daily.   atorvastatin 40 MG tablet Commonly known as: LIPITOR Take 40 mg by mouth daily.   budesonide-formoterol 160-4.5 MCG/ACT inhaler Commonly known as: SYMBICORT Inhale 2 puffs into the lungs 2 (two) times daily.   doxycycline 100 MG tablet Commonly known as: VIBRA-TABS Take 1 tablet (100 mg total) by mouth every 12 (twelve) hours for 4 days.   fenofibrate 160 MG tablet Take 160 mg by mouth every evening.   furosemide 20 MG tablet Commonly known as: Lasix Take 1 tablet (20 mg total) by mouth daily.   glipiZIDE 10 MG 24 hr tablet Commonly known as: GLUCOTROL XL Take 20 mg by mouth daily with breakfast.   ipratropium-albuterol 0.5-2.5 (3) MG/3ML Soln Commonly known as: DUONEB Take 3 mLs by nebulization 4 (four) times daily. What changed: when to take this   isosorbide mononitrate 30 MG 24 hr tablet Commonly known as: IMDUR Take 1 tablet (30 mg total) by mouth 2 (two) times daily.   LORazepam 0.5 MG tablet Commonly known as: ATIVAN Take 0.25-0.5 mg by mouth 2 (two) times daily as needed for anxiety.   metFORMIN 1000 MG tablet Commonly known as: GLUCOPHAGE Take 1 tablet (1,000 mg total) by mouth 2 (two) times daily with a meal.   metoprolol succinate 100 MG 24 hr tablet Commonly known as: TOPROL-XL TAKE 1 TABLET BY MOUTH ONCE DAILY   nitroGLYCERIN 0.4 MG SL tablet Commonly known as: NITROSTAT DISSOLVE ONE TABLET UNDER THE TONGUE EVERY 5 MINUTES AS NEEDED FOR CHEST PAIN.  DO NOT EXCEED A TOTAL OF 3 DOSES IN 15 MINUTES What changed:   how much to take  how to take this  when to take this  reasons to take this  additional instructions   potassium chloride SA 20 MEQ tablet Commonly known as: Klor-Con M20 Take 1 tablet (20 mEq total) by mouth daily.   predniSONE 20 MG tablet Commonly known as: DELTASONE Take 3 PO QAM x3days, 2 PO QAM x3days, 1 PO QAM x3days Start taking  on: September 01, 2019   ProAir HFA 108 (90 Base) MCG/ACT inhaler Generic drug: albuterol Inhale 2 puffs into the lungs every 4 (four) hours as needed for wheezing.   spironolactone 25 MG tablet Commonly known as: ALDACTONE Take 1 tablet (25 mg total) by mouth daily as needed (swelling).   telmisartan 80 MG tablet Commonly known as: MICARDIS Take 40 mg by mouth daily.            Durable Medical Equipment  (From admission, onward)         Start     Ordered   08/31/19 0746  For home use only DME Walker rolling  Once    Question:  Patient needs a walker to treat with the following condition  Answer:  Gait instability   08/31/19 0745         Follow-up Information    [name], FNP Follow up in 1 week(s).   Specialty: Nurse Practitioner Contact information: [address] [place] [address] [phone]        Satira Sark, MD. Schedule an appointment as soon as possible for a visit in 2 week(s).   Specialty: Cardiology Contact information: [place] [address] [phone]  Allergies  Allergen Reactions  . Bee Venom Swelling  . Latex Swelling    LATEX CATHETERS   Allergies as of 08/31/2019      Reactions   Bee Venom Swelling   Latex Swelling   LATEX CATHETERS      Medication List    TAKE these medications   ALIGN PO Take 1 capsule by mouth daily.   amLODipine 5 MG tablet Commonly known as: NORVASC Take 1 tablet (5 mg total) by mouth daily.   apixaban 5 MG Tabs tablet Commonly known as: Eliquis Take 1 tablet (5 mg total) by mouth 2 (two) times daily.   atorvastatin 40 MG tablet Commonly known as: LIPITOR Take 40 mg by mouth daily.   budesonide-formoterol 160-4.5 MCG/ACT inhaler Commonly known as: SYMBICORT Inhale 2 puffs into the lungs 2 (two) times daily.   doxycycline 100 MG tablet Commonly known as: VIBRA-TABS Take 1 tablet (100 mg total) by mouth every 12 (twelve)  hours for 4 days.   fenofibrate 160 MG tablet Take 160 mg by mouth every evening.   furosemide 20 MG tablet Commonly known as: Lasix Take 1 tablet (20 mg total) by mouth daily.   glipiZIDE 10 MG 24 hr tablet Commonly known as: GLUCOTROL XL Take 20 mg by mouth daily with breakfast.   ipratropium-albuterol 0.5-2.5 (3) MG/3ML Soln Commonly known as: DUONEB Take 3 mLs by nebulization 4 (four) times daily. What changed: when to take this   isosorbide mononitrate 30 MG 24 hr tablet Commonly known as: IMDUR Take 1 tablet (30 mg total) by mouth 2 (two) times daily.   LORazepam 0.5 MG tablet Commonly known as: ATIVAN Take 0.25-0.5 mg by mouth 2 (two) times daily as needed for anxiety.   metFORMIN 1000 MG tablet Commonly known as: GLUCOPHAGE Take 1 tablet (1,000 mg total) by mouth 2 (two) times daily with a meal.   metoprolol succinate 100 MG 24 hr tablet Commonly known as: TOPROL-XL TAKE 1 TABLET BY MOUTH ONCE DAILY   nitroGLYCERIN 0.4 MG SL tablet Commonly known as: NITROSTAT DISSOLVE ONE TABLET UNDER THE TONGUE EVERY 5 MINUTES AS NEEDED FOR CHEST PAIN.  DO NOT EXCEED A TOTAL OF 3 DOSES IN 15 MINUTES What changed:   how much to take  how to take this  when to take this  reasons to take this  additional instructions   potassium chloride SA 20 MEQ tablet Commonly known as: Klor-Con M20 Take 1 tablet (20 mEq total) by mouth daily.   predniSONE 20 MG tablet Commonly known as: DELTASONE Take 3 PO QAM x3days, 2 PO QAM x3days, 1 PO QAM x3days Start taking on: September 01, 2019   ProAir HFA 108 (90 Base) MCG/ACT inhaler Generic drug: albuterol Inhale 2 puffs into the lungs every 4 (four) hours as needed for wheezing.   spironolactone 25 MG tablet Commonly known as: ALDACTONE Take 1 tablet (25 mg total) by mouth daily as needed (swelling).   telmisartan 80 MG tablet Commonly known as: MICARDIS Take 40 mg by mouth daily.            Durable Medical Equipment   (From admission, onward)         Start     Ordered   08/31/19 0746  For home use only DME Walker rolling  Once    Question:  Patient needs a walker to treat with the following condition  Answer:  Gait instability   08/31/19 0745  Procedures/Studies: Dg Chest Port 1 View  Result Date: 08/29/2019 CLINICAL DATA:  Shortness of breath. Low O2 sats. EXAM: PORTABLE CHEST 1 VIEW COMPARISON:  Chest radiograph 07/18/2018, CT chest 06/05/2019 FINDINGS: Stable cardiomediastinal contours with enlarged heart size. Unchanged position of a right chest ICD. Persistent elevation of the right hemidiaphragm. The lungs are clear. No pneumothorax or large pleural effusion. No acute finding in the visualized skeleton. IMPRESSION: No acute cardiopulmonary finding. Electronically Signed   By: Audie Pinto M.D.   On: 08/29/2019 19:42      Subjective: Pt says that she is breathing much better today, she has ambulated with PT and no follow up with PT was recommended.   Discharge Exam: Vitals:   08/31/19 0514 08/31/19 1008  BP: 136/73   Pulse: 75   Resp: 16   Temp: 97.8 F (36.6 C)   SpO2: 98% 96%   Vitals:   08/30/19 1956 08/30/19 2105 08/31/19 0514 08/31/19 1008  BP:  132/63 136/73   Pulse:  78 75   Resp:  18 16   Temp:  (!) 97.5 F (36.4 C) 97.8 F (36.6 C)   TempSrc:  Oral Oral   SpO2: 96% 95% 98% 96%  Weight:      Height:        General: Pt is alert, awake, not in acute distress Cardiovascular: RRR, S1/S2 +, no rubs, no gallops Respiratory: much better air movement, no wheezing, no rhonchi Abdominal: Soft, NT, ND, bowel sounds + Extremities: no edema, no cyanosis   The results of significant diagnostics from this hospitalization (including imaging, microbiology, ancillary and laboratory) are listed below for reference.     Microbiology: Recent Results (from the past 240 hour(s))  SARS Coronavirus 2 by RT PCR (hospital order, performed in [facility] hospital lab)  Nasopharyngeal Nasopharyngeal Swab     Status: None   Collection Time: 08/29/19  6:18 PM   Specimen: Nasopharyngeal Swab  Result Value Ref Range Status   SARS Coronavirus 2 NEGATIVE NEGATIVE Final    Comment: (NOTE) If result is NEGATIVE SARS-CoV-2 target nucleic acids are NOT DETECTED. The SARS-CoV-2 RNA is generally detectable in upper and lower  respiratory specimens during the acute phase of infection. The lowest  concentration of SARS-CoV-2 viral copies this assay can detect is 250  copies / mL. A negative result does not preclude SARS-CoV-2 infection  and should not be used as the sole basis for treatment or other  patient management decisions.  A negative result may occur with  improper specimen collection / handling, submission of specimen other  than nasopharyngeal swab, presence of viral mutation(s) within the  areas targeted by this assay, and inadequate number of viral copies  (<250 copies / mL). A negative result must be combined with clinical  observations, patient history, and epidemiological information. If result is POSITIVE SARS-CoV-2 target nucleic acids are DETECTED. The SARS-CoV-2 RNA is generally detectable in upper and lower  respiratory specimens dur ing the acute phase of infection.  Positive  results are indicative of active infection with SARS-CoV-2.  Clinical  correlation with patient history and other diagnostic information is  necessary to determine patient infection status.  Positive results do  not rule out bacterial infection or co-infection with other viruses. If result is PRESUMPTIVE POSTIVE SARS-CoV-2 nucleic acids MAY BE PRESENT.   A presumptive positive result was obtained on the submitted specimen  and confirmed on repeat testing.  While 2019 novel coronavirus  (SARS-CoV-2) nucleic acids may be present in  the submitted sample  additional confirmatory testing may be necessary for epidemiological  and / or clinical management purposes  to  differentiate between  SARS-CoV-2 and other Sarbecovirus currently known to infect humans.  If clinically indicated additional testing with an alternate test  methodology [phone]) is advised. The SARS-CoV-2 RNA is generally  detectable in upper and lower respiratory sp ecimens during the acute  phase of infection. The expected result is Negative. Fact Sheet for Patients:  StrictlyIdeas.no Fact Sheet for Healthcare Providers: BankingDealers.co.za This test is not yet approved or cleared by the [place] FDA and has been authorized for detection and/or diagnosis of SARS-CoV-2 by FDA under an Emergency Use Authorization (EUA).  This EUA will remain in effect (meaning this test can be used) for the duration of the COVID-19 declaration under Section 564(b)(1) of the Act, 21 U.S.C. section 360bbb-3(b)(1), unless the authorization is terminated or revoked sooner. Performed at [facility], [address], [place], [place] [address]      Labs: BNP (last 3 results) Recent Labs    12/16/18 1058 08/29/19 1833  BNP 271.0* [id number]*   Basic Metabolic Panel: Recent Labs  Lab 08/29/19 1833 08/30/19 0449  NA 139 138  K 4.9 4.5  CL 101 100  CO2 25 25  GLUCOSE 240* 306*  BUN 39* 43*  CREATININE 1.81* 1.71*  CALCIUM 9.5 9.1   Liver Function Tests: No results for input(s): AST, ALT, ALKPHOS, BILITOT, PROT, ALBUMIN in the last 168 hours. No results for input(s): LIPASE, AMYLASE in the last 168 hours. No results for input(s): AMMONIA in the last 168 hours. CBC: Recent Labs  Lab 08/29/19 1833  WBC 9.1  NEUTROABS 6.5  HGB 13.2  HCT 43.5  MCV 93.5  PLT 419*   Cardiac Enzymes: No results for input(s): CKTOTAL, CKMB, CKMBINDEX, TROPONINI in the last 168 hours. BNP: Invalid input(s): POCBNP CBG: Recent Labs  Lab 08/30/19 1108 08/30/19 1621 08/30/19 2107 08/31/19 0752 08/31/19 1124  GLUCAP 285* 194* 148* 231* 180*    D-Dimer No results for input(s): DDIMER in the last 72 hours. Hgb A1c Recent Labs    08/29/19 1833  HGBA1C 6.2*   Lipid Profile No results for input(s): CHOL, HDL, LDLCALC, TRIG, CHOLHDL, LDLDIRECT in the last 72 hours. Thyroid function studies No results for input(s): TSH, T4TOTAL, T3FREE, THYROIDAB in the last 72 hours.  Invalid input(s): FREET3 Anemia work up No results for input(s): VITAMINB12, FOLATE, FERRITIN, TIBC, IRON, RETICCTPCT in the last 72 hours. Urinalysis    Component Value Date/Time   COLORURINE YELLOW 07/18/2018 2014   APPEARANCEUR CLOUDY (A) 07/18/2018 2014   LABSPEC 1.015 07/18/2018 2014   PHURINE 5.0 07/18/2018 2014   GLUCOSEU NEGATIVE 07/18/2018 2014   HGBUR NEGATIVE 07/18/2018 2014   [place] NEGATIVE 07/18/2018 2014   [place] 07/18/2018 2014   PROTEINUR NEGATIVE 07/18/2018 2014   UROBILINOGEN 1.0 05/11/2011 2020   NITRITE NEGATIVE 07/18/2018 2014   LEUKOCYTESUR TRACE (A) 07/18/2018 2014   Sepsis Labs Invalid input(s): PROCALCITONIN,  WBC,  LACTICIDVEN Microbiology Recent Results (from the past 240 hour(s))  SARS Coronavirus 2 by RT PCR (hospital order, performed in [place] hospital lab) Nasopharyngeal Nasopharyngeal Swab     Status: None   Collection Time: 08/29/19  6:18 PM   Specimen: Nasopharyngeal Swab  Result Value Ref Range Status   SARS Coronavirus 2 NEGATIVE NEGATIVE Final    Comment: (NOTE) If result is NEGATIVE SARS-CoV-2 target nucleic acids are NOT DETECTED. The SARS-CoV-2 RNA is generally detectable  in upper and lower  respiratory specimens during the acute phase of infection. The lowest  concentration of SARS-CoV-2 viral copies this assay can detect is 250  copies / mL. A negative result does not preclude SARS-CoV-2 infection  and should not be used as the sole basis for treatment or other  patient management decisions.  A negative result may occur with  improper specimen collection / handling, submission of  specimen other  than nasopharyngeal swab, presence of viral mutation(s) within the  areas targeted by this assay, and inadequate number of viral copies  (<250 copies / mL). A negative result must be combined with clinical  observations, patient history, and epidemiological information. If result is POSITIVE SARS-CoV-2 target nucleic acids are DETECTED. The SARS-CoV-2 RNA is generally detectable in upper and lower  respiratory specimens dur ing the acute phase of infection.  Positive  results are indicative of active infection with SARS-CoV-2.  Clinical  correlation with patient history and other diagnostic information is  necessary to determine patient infection status.  Positive results do  not rule out bacterial infection or co-infection with other viruses. If result is PRESUMPTIVE POSTIVE SARS-CoV-2 nucleic acids MAY BE PRESENT.   A presumptive positive result was obtained on the submitted specimen  and confirmed on repeat testing.  While 2019 novel coronavirus  (SARS-CoV-2) nucleic acids may be present in the submitted sample  additional confirmatory testing may be necessary for epidemiological  and / or clinical management purposes  to differentiate between  SARS-CoV-2 and other Sarbecovirus currently known to infect humans.  If clinically indicated additional testing with an alternate test  methodology [phone]) is advised. The SARS-CoV-2 RNA is generally  detectable in upper and lower respiratory sp ecimens during the acute  phase of infection. The expected result is Negative. Fact Sheet for Patients:  StrictlyIdeas.no Fact Sheet for Healthcare Providers: BankingDealers.co.za This test is not yet approved or cleared by the [place] FDA and has been authorized for detection and/or diagnosis of SARS-CoV-2 by FDA under an Emergency Use Authorization (EUA).  This EUA will remain in effect (meaning this test can be used) for the  duration of the COVID-19 declaration under Section 564(b)(1) of the Act, 21 U.S.C. section 360bbb-3(b)(1), unless the authorization is terminated or revoked sooner. Performed at [facility], [address], [place], [place] [address]    Time coordinating discharge: 32 mins  SIGNED:  Irwin Brakeman, MD  [facility] 08/31/2019, 11:52 AM How to contact the [facility] Attending or Consulting provider [place] or covering provider during after hours [place], for this patient?  1. Check the care team in [facility] and look for a) attending/consulting [facility] provider listed and b) the [facility] team listed 2. Log into [email or web address] and use [place]'s universal password to access. If you do not have the password, please contact the hospital operator. 3. Locate the [facility] provider you are looking for under [facility] and page to a number that you can be directly reached. 4. If you still have difficulty reaching the provider, please page the [facility] (Director on Call) for the Hospitalists listed on amion for assistance.

## 2019-08-31 NOTE — Progress Notes (Signed)
Patient left floor in stable condition via w/c accompanied by nurse tech. Discharged home. Angelos Wasco, RN 

## 2019-08-31 NOTE — Progress Notes (Signed)
Discharge instructions reviewed with patient, given AVS. Verbalized understanding of follow-up with PCP and cardiology office. Cardiology to call her with appointment time per office clerk this am. Pt states she will call PCP office to schedule since nursing was unable to reach anyone this afternoon. IV site removed, site within normal limits. Patient states she is to have RW delivered to room prior to discharge. Awaiting arrival. States her son will bring portable O2 for tranpsort home. Pt in stable condition awaiting son's arrival for discharge home. Donavan Foil, RN

## 2019-08-31 NOTE — TOC Transition Note (Signed)
Transition of Care [facility]) - CM/SW Discharge Note   Patient Details  Name: Brandi Hunter MRN: [id number] Date of Birth: 23-Jul-1941  [facility]) CM/SW Contact:  Konstantin Lehnen, Chauncey [place], RN Phone Number: 08/31/2019, 9:20 AM   Clinical Narrative:   RW recommended by PT. Patient would like delivered to room. No preference on provider.    Discharge Planning Services: CM Consult                     DME Arranged: Walker rolling DME Agency: [employer] Date DME Agency Contacted: 08/31/19 Time DME Agency Contacted: [phone] Representative spoke with at DME Agency: [place] of Daily Living Home Assistive Devices/Equipment: Oxygen, Eyeglasses, Nebulizer, CPAP, Cane (specify quad or straight), Walker (specify type), Grab bars around toilet, Shower chair without back ADL Screening (condition at time of admission) Patient's cognitive ability adequate to safely complete daily activities?: Yes Is the patient deaf or have difficulty hearing?: No Does the patient have difficulty seeing, even when wearing glasses/contacts?: No Does the patient have difficulty concentrating, remembering, or making decisions?: No Patient able to express need for assistance with ADLs?: Yes Does the patient have difficulty dressing or bathing?: No Independently performs ADLs?: Yes (appropriate for developmental age) Does the patient have difficulty walking or climbing stairs?: No Weakness of Legs: None Weakness of Arms/Hands: None      Admission diagnosis:  COPD exacerbation ([place]) [J44.1] Patient Active Problem List   Diagnosis Date Noted  . Acute and chronic respiratory failure (acute-on-chronic) ([place]) 08/30/2019  . COPD exacerbation ([place]) 08/29/2019  . Chest pain 07/18/2018  . Sepsis due to urinary tract infection ([place]) 06/30/2018  . AKI (acute kidney injury) ([place]) 06/30/2018  . Sepsis, unspecified organism ([place]) 06/30/2018  . Normocytic anemia 06/30/2018  . Coronary artery  disease due to lipid rich plaque   . Unstable angina ([place]) 03/23/2018  . Acute respiratory distress 09/26/2017  . Acute exacerbation of chronic obstructive pulmonary disease (COPD) ([place]) 09/26/2017  . 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 Type 2 diabetes mellitus ([place]) 09/26/2017  . Acute on chronic diastolic CHF (congestive heart failure) ([place]) 09/26/2017  . Cardiac pacemaker 11/17/2016  . Eczema 11/17/2016  . Major depression, recurrent, chronic ([place]) 09/17/2016  . Axillary abscess 07/22/2016  . Chronic left shoulder pain 10/01/2015  . Xerosis cutis 10/01/2015  . Paroxysmal atrial fibrillation ([place]) 05/24/2014  . PAF (paroxysmal atrial fibrillation) ([place]) 05/24/2014  . NAFLD (nonalcoholic fatty liver disease) 03/29/2014  . Renal cyst 03/29/2014  . Elevated TSH 03/06/2014  . Type 2 diabetes mellitus without complication, without long-term current use of insulin ([place]) 03/06/2014  . Peripheral neuropathy 04/27/2013  . Vitamin B12 deficiency 04/27/2013  . Abnormality of gait 03/29/2013  . Diabetic neuropathy ([place]) 03/29/2013  . OSA (obstructive sleep apnea) 08/26/2012  . Chronic asthmatic bronchitis ([place]) 08/26/2012  . Preop cardiovascular exam 08/15/2012  . Nocturnal hypoxia 04/08/2012  . Seborrheic dermatitis 10/21/2011  . IBS (irritable bowel syndrome) 05/27/2011  . Esophageal reflux 03/27/2011  . Mixed hyperlipidemia 03/27/2011  . Chronic obstructive pulmonary disease ([place]) 03/27/2011  . 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 Essential hypertension 02/09/2011  . Obesity 02/09/2011  . Tobacco use disorder 02/05/2011  . Vitamin D deficiency 12/30/2010  . Osteoarthrosis involving lower leg 12/26/2010  . Allergic rhinitis 01/23/2009  . Mixed incontinence 10/08/2008   PCP:  [name], [place] [facility]acy:   [facility]  [place] [place] [address] [place] [address] Phone:  [phone] Fax: [phone]

## 2019-08-31 NOTE — Progress Notes (Signed)
RW delivered to patient's room this afternoon. Donavan Foil, RN

## 2019-08-31 NOTE — Progress Notes (Signed)
Physical Therapy Treatment Patient Details Name: Brandi Hunter MRN: [id number] DOB: [birth year] Today's Date: 08/31/2019    History of Present Illness Brandi Hunter is a 78 y.o. female with medical history significant for asthma and COPD with chronic respiratory failure, atrial fibrillation, hx of AV block -pacemaker status, diabetes mellitus, CHF, depression.  Patient presented to the ED with complaints of difficulty breathing over the past 4 days.  She reports wheezing also.  She reports associated chest tightness without chest pain.  She denies cough.  No fevers no chills.  She reports compliance with her medications which includes Eliquis, Lasix.  Denies lower extremity swelling. She is on chronic 2 L O2 as needed, but she has had 2 increase her oxygen use.    PT Comments    Patient able to complete all seated exercise without c/o SOB. Patient is able to perform bed mobility, transfers, and gait as stated below. She is educated on importance of using RW to reduce the risk of falls and is shown the proper way to ambulate using the RW. She demonstrates correct use/mechanics while ambulating with RW.  She is also given a handout of exercises performed during today's session. Patient will benefit from continued physical therapy in hospital and recommended venue below to increase strength, balance, endurance for safe ADLs and gait.     Follow Up Recommendations  No PT follow up;Supervision - Intermittent;Supervision for mobility/OOB     Equipment Recommendations  Rolling walker with 5" wheels    Recommendations for Other Services       Precautions / Restrictions Precautions Precautions: Fall Restrictions Weight Bearing Restrictions: No    Mobility  Bed Mobility Overal bed mobility: Modified Independent             General bed mobility comments: slow, on 2L O2  Transfers Overall transfer level: Modified independent Equipment used: Rolling walker (2 wheeled) Transfers: Sit  to/from Stand Sit to Stand: Modified independent (Device/Increase time)         General transfer comment: using RW  Ambulation/Gait Ambulation/Gait assistance: Modified independent (Device/Increase time) Gait Distance (Feet): 10 Feet Assistive device: Rolling walker (2 wheeled) Gait Pattern/deviations: Step-through pattern;Decreased step length - right;Decreased step length - left Gait velocity: decreased   General Gait Details: slow, using RW   Stairs             Wheelchair Mobility    Modified Rankin (Stroke Patients Only)       Balance Overall balance assessment: Modified Independent Sitting-balance support: Feet supported;No upper extremity supported Sitting balance-Leahy Scale: Good Sitting balance - Comments: seated at bedside, required UE support with LE exercises   Standing balance support: Bilateral upper extremity supported Standing balance-Leahy Scale: Fair Standing balance comment: good/fair using RW                            Cognition Arousal/Alertness: Awake/alert Behavior During Therapy: WFL for tasks assessed/performed Overall Cognitive Status: Within Functional Limits for tasks assessed                                        Exercises General Exercises - Lower Extremity Long Arc Quad: AROM;Both;10 reps;Seated Hip Flexion/Marching: AROM;Seated;Both;10 reps Toe Raises: AROM;Both;10 reps;Seated Heel Raises: AROM;Seated;Both;10 reps    General Comments        Pertinent Vitals/Pain Pain Assessment: No/denies pain  Home Living                      Prior Function            PT Goals (current goals can now be found in the care plan section) Acute Rehab PT Goals Patient Stated Goal: go home PT Goal Formulation: With patient Time For Goal Achievement: 09/13/19 Potential to Achieve Goals: Good Progress towards PT goals: Progressing toward goals    Frequency    Min 3X/week      PT Plan  Current plan remains appropriate    Co-evaluation              AM-PAC PT "6 Clicks" Mobility   Outcome Measure  Help needed turning from your back to your side while in a flat bed without using bedrails?: None Help needed moving from lying on your back to sitting on the side of a flat bed without using bedrails?: None Help needed moving to and from a bed to a chair (including a wheelchair)?: None Help needed standing up from a chair using your arms (e.g., wheelchair or bedside chair)?: None Help needed to walk in hospital room?: A Little Help needed climbing 3-5 steps with a railing? : A Lot 6 Click Score: 21    End of Session Equipment Utilized During Treatment: Oxygen Activity Tolerance: Patient tolerated treatment well Patient left: in bed;with call bell/phone within reach Nurse Communication: Mobility status PT Visit Diagnosis: Unsteadiness on feet (R26.81);Muscle weakness (generalized) (M62.81);Other abnormalities of gait and mobility (R26.89)     Time: [id number] PT Time Calculation (min) (ACUTE ONLY): 14 min  Charges:  $Therapeutic Exercise: 8-22 mins                     3:19 PM, 08/31/19 Mearl Latin PT, DPT Physical Therapist at [facility]

## 2019-08-31 NOTE — Discharge Instructions (Signed)
Chronic Obstructive Pulmonary Disease °Chronic obstructive pulmonary disease (COPD) is a long-term (chronic) lung problem. When you have COPD, it is hard for air to get in and out of your lungs. Usually the condition gets worse over time, and your lungs will never return to normal. There are things you can do to keep yourself as healthy as possible. °· Your doctor may treat your condition with: °? Medicines. °? Oxygen. °? Lung surgery. °· Your doctor may also recommend: °? Rehabilitation. This includes steps to make your body work better. It may involve a team of specialists. °? Quitting smoking, if you smoke. °? Exercise and changes to your diet. °? Comfort measures (palliative care). °Follow these instructions at home: °Medicines °· Take over-the-counter and prescription medicines only as told by your doctor. °· Talk to your doctor before taking any cough or allergy medicines. You may need to avoid medicines that cause your lungs to be dry. °Lifestyle °· If you smoke, stop. Smoking makes the problem worse. If you need help quitting, ask your doctor. °· Avoid being around things that make your breathing worse. This may include smoke, chemicals, and fumes. °· Stay active, but remember to rest as well. °· Learn and use tips on how to relax. °· Make sure you get enough sleep. Most adults need at least 7 hours of sleep every night. °· Eat healthy foods. Eat smaller meals more often. Rest before meals. °Controlled breathing °Learn and use tips on how to control your breathing as told by your doctor. Try: °· Breathing in (inhaling) through your nose for 1 second. Then, pucker your lips and breath out (exhale) through your lips for 2 seconds. °· Putting one hand on your belly (abdomen). Breathe in slowly through your nose for 1 second. Your hand on your belly should move out. Pucker your lips and breathe out slowly through your lips. Your hand on your belly should move in as you breathe out. ° °Controlled coughing °Learn  and use controlled coughing to clear mucus from your lungs. Follow these steps: °1. Lean your head a little forward. °2. Breathe in deeply. °3. Try to hold your breath for 3 seconds. °4. Keep your mouth slightly open while coughing 2 times. °5. Spit any mucus out into a tissue. °6. Rest and do the steps again 1 or 2 times as needed. °General instructions °· Make sure you get all the shots (vaccines) that your doctor recommends. Ask your doctor about a flu shot and a pneumonia shot. °· Use oxygen therapy and pulmonary rehabilitation if told by your doctor. If you need home oxygen therapy, ask your doctor if you should buy a tool to measure your oxygen level (oximeter). °· Make a COPD action plan with your doctor. This helps you to know what to do if you feel worse than usual. °· Manage any other conditions you have as told by your doctor. °· Avoid going outside when it is very hot, cold, or humid. °· Avoid people who have a sickness you can catch (contagious). °· Keep all follow-up visits as told by your doctor. This is important. °Contact a doctor if: °· You cough up more mucus than usual. °· There is a change in the color or thickness of the mucus. °· It is harder to breathe than usual. °· Your breathing is faster than usual. °· You have trouble sleeping. °· You need to use your medicines more often than usual. °· You have trouble doing your normal activities such as getting dressed   or walking around the house. Get help right away if:  You have shortness of breath while resting.  You have shortness of breath that stops you from: ? Being able to talk. ? Doing normal activities.  Your chest hurts for longer than 5 minutes.  Your skin color is more blue than usual.  Your pulse oximeter shows that you have low oxygen for longer than 5 minutes.  You have a fever.  You feel too tired to breathe normally. Summary  Chronic obstructive pulmonary disease (COPD) is a long-term lung problem.  The way your  lungs work will never return to normal. Usually the condition gets worse over time. There are things you can do to keep yourself as healthy as possible.  Take over-the-counter and prescription medicines only as told by your doctor.  If you smoke, stop. Smoking makes the problem worse. This information is not intended to replace advice given to you by your health care provider. Make sure you discuss any questions you have with your health care provider. Document Released: 04/20/2008 Document Revised: 10/15/2017 Document Reviewed: 12/07/2016 Elsevier Patient Education  2020 [place].   Chronic Obstructive Pulmonary Disease Exacerbation Chronic obstructive pulmonary disease (COPD) is a long-term (chronic) lung problem. In COPD, the flow of air from the lungs is limited. COPD exacerbations are times that breathing gets worse and you need more than your normal treatment. Without treatment, they can be life threatening. If they happen often, your lungs can become more damaged. If your COPD gets worse, your doctor may treat you with:  Medicines.  Oxygen.  Different ways to clear your airway, such as using a mask. Follow these instructions at home: Medicines  Take over-the-counter and prescription medicines only as told by your doctor.  If you take an antibiotic or steroid medicine, do not stop taking the medicine even if you start to feel better.  Keep up with shots (vaccinations) as told by your doctor. Be sure to get a yearly (annual) flu shot. Lifestyle  Do not smoke. If you need help quitting, ask your doctor.  Eat healthy foods.  Exercise regularly.  Get plenty of sleep.  Avoid tobacco smoke and other things that can bother your lungs.  Wash your hands often with soap and water. This will help keep you from getting an infection. If you cannot use soap and water, use hand sanitizer.  During flu season, avoid areas that are crowded with people. General instructions  Drink  enough fluid to keep your pee (urine) clear or pale yellow. Do not do this if your doctor has told you not to.  Use a cool mist machine (vaporizer).  If you use oxygen or a machine that turns medicine into a mist (nebulizer), continue to use it as told.  Follow all instructions for rehabilitation. These are steps you can take to make your body work better.  Keep all follow-up visits as told by your doctor. This is important. Contact a doctor if:  Your COPD symptoms get worse than normal. Get help right away if:  You are short of breath and it gets worse.  You have trouble talking.  You have chest pain.  You cough up blood.  You have a fever.  You keep throwing up (vomiting).  You feel weak or you pass out (faint).  You feel confused.  You are not able to sleep because of your symptoms.  You are not able to do daily activities. Summary  COPD exacerbations are times that breathing  gets worse and you need more treatment than normal.  COPD exacerbations can be very serious and may cause your lungs to become more damaged.  Do not smoke. If you need help quitting, ask your doctor.  Stay up-to-date on your shots. Get a flu shot every year. This information is not intended to replace advice given to you by your health care provider. Make sure you discuss any questions you have with your health care provider. Document Released: 10/22/2011 Document Revised: 10/15/2017 Document Reviewed: 12/07/2016 Elsevier Patient Education  2020 [place].     IMPORTANT INFORMATION: PAY CLOSE ATTENTION   PHYSICIAN DISCHARGE INSTRUCTIONS  Follow with Primary care provider  [name], [place]  and other consultants as instructed by your Hospitalist Physician  [place] IF SYMPTOMS COME BACK, WORSEN OR NEW PROBLEM DEVELOPS   Please note: You were cared for by a hospitalist during your hospital stay. Every effort will be made to forward records  to your primary care provider.  You can request that your primary care provider send for your hospital records if they have not received them.  Once you are discharged, your primary care physician will handle any further medical issues. Please note that NO REFILLS for any discharge medications will be authorized once you are discharged, as it is imperative that you return to your primary care physician (or establish a relationship with a primary care physician if you do not have one) for your post hospital discharge needs so that they can reassess your need for medications and monitor your lab values.  Please get a complete blood count and chemistry panel checked by your Primary MD at your next visit, and again as instructed by your Primary MD.  Get Medicines reviewed and adjusted: Please take all your medications with you for your next visit with your Primary MD  Laboratory/radiological data: Please request your Primary MD to go over all hospital tests and procedure/radiological results at the follow up, please ask your primary care provider to get all Hospital records sent to his/her office.  In some cases, they will be blood work, cultures and biopsy results pending at the time of your discharge. Please request that your primary care provider follow up on these results.  If you are diabetic, please bring your blood sugar readings with you to your follow up appointment with primary care.    Please call and make your follow up appointments as soon as possible.    Also Note the following: If you experience worsening of your admission symptoms, develop shortness of breath, life threatening emergency, suicidal or homicidal thoughts you must seek medical attention immediately by calling 911 or calling your MD immediately  if symptoms less severe.  You must read complete instructions/literature along with all the possible adverse reactions/side effects for all the Medicines you take and that have been  prescribed to you. Take any new Medicines after you have completely understood and accpet all the possible adverse reactions/side effects.   Do not drive when taking Pain medications or sleeping medications (Benzodiazepines)  Do not take more than prescribed Pain, Sleep and Anxiety Medications. It is not advisable to combine anxiety,sleep and pain medications without talking with your primary care practitioner  Special Instructions: If you have smoked or chewed Tobacco  in the last 2 yrs please stop smoking, stop any regular Alcohol  and or any Recreational drug use.  Wear Seat belts while driving.  Do not drive if taking any  narcotic, mind altering or controlled substances or recreational drugs or alcohol.

## 2019-09-01 ENCOUNTER — Ambulatory Visit (HOSPITAL_COMMUNITY)
Admission: RE | Admit: 2019-09-01 | Discharge: 2019-09-01 | Disposition: A | Discharge status: Home / Self Care | Payer: Medicare HMO | Source: Ambulatory Visit | Attending: Physician Assistant | Admitting: Physician Assistant

## 2019-09-01 ENCOUNTER — Other Ambulatory Visit: Payer: Self-pay

## 2019-09-01 VITALS — BP 110/60 | HR 87 | Ht 66.0 in | Wt 232.2 lb

## 2019-09-01 DIAGNOSIS — E119 Type 2 diabetes mellitus without complications: Secondary | ICD-10-CM | POA: Insufficient documentation

## 2019-09-01 DIAGNOSIS — I2510 Atherosclerotic heart disease of native coronary artery without angina pectoris: Secondary | ICD-10-CM | POA: Diagnosis not present

## 2019-09-01 DIAGNOSIS — K219 Gastro-esophageal reflux disease without esophagitis: Secondary | ICD-10-CM | POA: Diagnosis not present

## 2019-09-01 DIAGNOSIS — Z79899 Other long term (current) drug therapy: Secondary | ICD-10-CM | POA: Insufficient documentation

## 2019-09-01 DIAGNOSIS — Z87891 Personal history of nicotine dependence: Secondary | ICD-10-CM | POA: Insufficient documentation

## 2019-09-01 DIAGNOSIS — Z7984 Long term (current) use of oral hypoglycemic drugs: Secondary | ICD-10-CM | POA: Insufficient documentation

## 2019-09-01 DIAGNOSIS — Z6837 Body mass index (BMI) 37.0-37.9, adult: Secondary | ICD-10-CM | POA: Diagnosis not present

## 2019-09-01 DIAGNOSIS — I10 Essential (primary) hypertension: Secondary | ICD-10-CM | POA: Insufficient documentation

## 2019-09-01 DIAGNOSIS — Z950 Presence of cardiac pacemaker: Secondary | ICD-10-CM | POA: Diagnosis not present

## 2019-09-01 DIAGNOSIS — Z7901 Long term (current) use of anticoagulants: Secondary | ICD-10-CM | POA: Diagnosis not present

## 2019-09-01 DIAGNOSIS — J449 Chronic obstructive pulmonary disease, unspecified: Secondary | ICD-10-CM | POA: Insufficient documentation

## 2019-09-01 DIAGNOSIS — E785 Hyperlipidemia, unspecified: Secondary | ICD-10-CM | POA: Diagnosis not present

## 2019-09-01 DIAGNOSIS — I4819 Other persistent atrial fibrillation: Secondary | ICD-10-CM | POA: Diagnosis not present

## 2019-09-01 DIAGNOSIS — G4733 Obstructive sleep apnea (adult) (pediatric): Secondary | ICD-10-CM | POA: Diagnosis not present

## 2019-09-01 DIAGNOSIS — E669 Obesity, unspecified: Secondary | ICD-10-CM | POA: Diagnosis not present

## 2019-09-01 NOTE — Progress Notes (Signed)
Primary Care Physician: [name], [place] Primary Cardiologist: Dr Domenic Polite Primary Electrophysiologist: Dr [name]  Referring Physician: Dr Trevor Iha Brandi Hunter is a 78 y.o. 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 Patient had previously had a very low AF burden on device interrogation. However, she was noted to be persistently in afib for over 6 months on device interrogation. Patient notes a gradual onset of fatigue during that time. She underwent DCCV on 08/18/19 x3 but quickly reverted to afib each time. She was admitted on 10/13-10/15/20 with an acute COPD exacerbation. Patient reports she is feeling better today but is still recovering from her hospital stay. She is in AV dual paced rhythm today.  Today, she denies symptoms of palpitations, chest pain, shortness of breath, orthopnea, PND, lower extremity edema, dizziness, presyncope, syncope, snoring, daytime somnolence, bleeding, or neurologic sequela. The patient is tolerating medications without difficulties and is otherwise without complaint today.    Atrial Fibrillation Risk Factors:  she does have symptoms or diagnosis of sleep apnea. she is compliant with CPAP therapy.   she has a BMI of Body mass index is 37.48 kg/m.Marland Kitchen Filed Weights   09/01/19 1039  Weight: 105.3 kg    Family History  Problem Relation Age of Onset  . Congestive Heart Failure Mother   . Congestive Heart Failure Father   . Osteoarthritis Sister   . Prostate cancer Brother   . Diabetes Brother   . Anesthesia problems Neg Hx   . Hypotension Neg Hx   . Malignant hyperthermia Neg Hx   . Pseudochol deficiency Neg Hx      Atrial Fibrillation Management history:  Previous antiarrhythmic drugs: none Previous cardioversions: 08/18/19 Previous ablations: none CHADS2VASC score: 6 Anticoagulation  history: Eliquis    Past Medical History:  Diagnosis Date  . Asthma   . CAD (coronary artery disease)    a. catheterization in 03/2018 showing 90% ostial OM stenosis with medical management recommended at that time  . Chronic anticoagulation   . COPD (chronic obstructive pulmonary disease) ([place])   . DJD (degenerative joint disease)   . Essential hypertension   . GERD (gastroesophageal reflux disease)   . History of home oxygen therapy   . Hyperlipidemia   . Mobitz (type) II atrioventricular block   . Obesity   . Pacemaker    Implanted by Dr [name] (MDT) 10/06/10  . PAF (paroxysmal atrial fibrillation) ([place])   . Pancreatitis 2010 OR 2011  . Sleep apnea    CPAP  . Type 2 diabetes mellitus ([place])    Past Surgical History:  Procedure Laterality Date  . CARDIOVERSION N/A 08/18/2019   Procedure: CARDIOVERSION;  Surgeon: Lelon Perla, MD;  Location: [facility] ENDOSCOPY;  Service: Cardiovascular;  Laterality: N/A;  . CATARACT EXTRACTION W/PHACO  11/02/2011   Procedure: CATARACT EXTRACTION PHACO AND INTRAOCULAR LENS PLACEMENT ([place]);  Surgeon: Williams Che;  Location: [facility] ORS;  Service: Ophthalmology;  Laterality: Right;  CDE=7.33  . CATARACT EXTRACTION W/PHACO  12/07/2011   Procedure: CATARACT EXTRACTION PHACO AND INTRAOCULAR LENS PLACEMENT (IOC);  Surgeon: Williams Che, MD;  Location: [facility] ORS;  Service: Ophthalmology;  Laterality: Left;  CDE 3.61  . COLONOSCOPY WITH PROPOFOL N/A 08/09/2014   Procedure: COLONOSCOPY WITH PROPOFOL;  Surgeon: Juanita Craver, MD;  Location: [facility] ENDOSCOPY;  Service: Endoscopy;  Laterality: N/A;  .  CYSTOCELE REPAIR    . ESOPHAGOGASTRODUODENOSCOPY (EGD) WITH PROPOFOL N/A 08/09/2014   Procedure: ESOPHAGOGASTRODUODENOSCOPY (EGD) WITH PROPOFOL;  Surgeon: Juanita Craver, MD;  Location: [facility] ENDOSCOPY;  Service: Endoscopy;  Laterality: N/A;  . EYE SURGERY  11/01/2012   BOTH EYES CATARACTS  . INSERT / REPLACE / REMOVE PACEMAKER  10/06/10   MDT  implanted by Dr [name]  .  KNEE ARTHROSCOPY     both  . LEFT HEART CATH AND CORONARY ANGIOGRAPHY N/A 03/24/2018   Procedure: LEFT HEART CATH AND CORONARY ANGIOGRAPHY;  Surgeon: Lorretta Harp, MD;  Location: [place] CV LAB;  Service: Cardiovascular;  Laterality: N/A;  . OVARY SURGERY     removal  . REPAIR RECTOCELE    . SIMPLE MASTECTOMY WITH AXILLARY SENTINEL NODE BIOPSY Left 01/09/2015   Procedure: Irrigation and Drainage Abcess left Axilla;  Surgeon: Jackolyn Confer, MD;  Location: [facility] ORS;  Service: General;  Laterality: Left;  . TONSILLECTOMY  AGE 60  . TOTAL ABDOMINAL HYSTERECTOMY  1971    Current Outpatient Medications  Medication Sig Dispense Refill  . albuterol (PROAIR HFA) 108 (90 BASE) MCG/ACT inhaler Inhale 2 puffs into the lungs every 4 (four) hours as needed for wheezing.     Marland Kitchen apixaban (ELIQUIS) 5 MG TABS tablet Take 1 tablet (5 mg total) by mouth 2 (two) times daily. 180 tablet 3  . atorvastatin (LIPITOR) 40 MG tablet Take 40 mg by mouth daily.     . budesonide-formoterol (SYMBICORT) 160-4.5 MCG/ACT inhaler Inhale 2 puffs into the lungs 2 (two) times daily.    Marland Kitchen doxycycline (VIBRA-TABS) 100 MG tablet Take 1 tablet (100 mg total) by mouth every 12 (twelve) hours for 4 days. 8 tablet 0  . fenofibrate 160 MG tablet Take 160 mg by mouth every evening.    Marland Kitchen glipiZIDE (GLUCOTROL XL) 10 MG 24 hr tablet Take 20 mg by mouth daily with breakfast. If blood sugar is below 60-70 do not take the medication    . ipratropium-albuterol (DUONEB) 0.5-2.5 (3) MG/3ML SOLN Take 3 mLs by nebulization 4 (four) times daily. 360 mL 1  . isosorbide mononitrate (IMDUR) 30 MG 24 hr tablet Take 1 tablet (30 mg total) by mouth 2 (two) times daily. 180 tablet 3  . LORazepam (ATIVAN) 0.5 MG tablet Take 0.25-0.5 mg by mouth as needed for anxiety.     . metFORMIN (GLUCOPHAGE) 1000 MG tablet Take 1 tablet (1,000 mg total) by mouth 2 (two) times daily with a meal.    . metoprolol succinate (TOPROL-XL) 100 MG 24 hr tablet TAKE 1 TABLET  BY MOUTH ONCE DAILY (Patient taking differently: Take 100 mg by mouth daily. ) 90 tablet 2  . nitroGLYCERIN (NITROSTAT) 0.4 MG SL tablet DISSOLVE ONE TABLET UNDER THE TONGUE EVERY 5 MINUTES AS NEEDED FOR CHEST PAIN.  DO NOT EXCEED A TOTAL OF 3 DOSES IN 15 MINUTES (Patient taking differently: Place 0.4 mg under the tongue every 5 (five) minutes as needed for chest pain. ) 25 tablet 1  . predniSONE (DELTASONE) 20 MG tablet Take 3 PO QAM x3days, 2 PO QAM x3days, 1 PO QAM x3days 18 tablet 0  . Probiotic Product (ALIGN PO) Take 1 capsule by mouth daily.     Marland Kitchen spironolactone (ALDACTONE) 25 MG tablet Take 1 tablet (25 mg total) by mouth daily as needed (swelling).    Marland Kitchen telmisartan (MICARDIS) 80 MG tablet Take 40 mg by mouth daily.     Marland Kitchen amLODipine (NORVASC) 5 MG tablet Take 1 tablet (  5 mg total) by mouth daily. 90 tablet 3  . furosemide (LASIX) 20 MG tablet Take 1 tablet (20 mg total) by mouth daily. 90 tablet 3  . potassium chloride SA (KLOR-CON M20) 20 MEQ tablet Take 1 tablet (20 mEq total) by mouth daily. 90 tablet 3   No current facility-administered medications for this encounter.     Allergies  Allergen Reactions  . Bee Venom Swelling  . Latex Swelling    LATEX CATHETERS    Social History   Socioeconomic History  . Marital status: Widowed    Spouse name: Not on file  . Number of children: 3  . Years of education: College  . Highest education level: Not on file  Occupational History  . Occupation: Part time Retail buyer: [employer]  . Financial resource strain: Not on file  . Food insecurity    Worry: Not on file    Inability: Not on file  . Transportation needs    Medical: Not on file    Non-medical: Not on file  Tobacco Use  . Smoking status: Former Smoker    Packs/day: 0.00    Years: 54.00    Pack years: 0.00    Types: Cigarettes    Quit date: 09/22/2017    Years since quitting: 1.9  . Smokeless tobacco: Former Systems developer    Quit date:  11/17/2011  Substance and Sexual Activity  . Alcohol use: Not Currently    Alcohol/week: 1.0 standard drinks    Types: 1 Glasses of wine per week    Comment: couple glasses of wine occasionally-once a month  . Drug use: No  . Sexual activity: Not on file  Lifestyle  . Physical activity    Days per week: Not on file    Minutes per session: Not on file  . Stress: Not on file  Relationships  . Social Herbalist on phone: Not on file    Gets together: Not on file    Attends religious service: Not on file    Active member of club or organization: Not on file    Attends meetings of clubs or organizations: Not on file    Relationship status: Not on file  . Intimate partner violence    Fear of current or ex partner: Not on file    Emotionally abused: Not on file    Physically abused: Not on file    Forced sexual activity: Not on file  Other Topics Concern  . Not on file  Social History Narrative   Patient lives at home alone.   Caffeine Use: 16oz drink daily     ROS- All systems are reviewed and negative except as per the HPI above.  Physical Exam: Vitals:   09/01/19 1039  BP: 110/60  Pulse: 87  Weight: 105.3 kg  Height: 5\' 6"  (1.676 m)    GEN- The patient is well appearing obese elderly female, alert and oriented x 3 today.   Head- normocephalic, atraumatic Eyes-  Sclera clear, conjunctiva pink Ears- hearing intact Oropharynx- clear Neck- supple  Lungs- diminished breath sounds bilaterally, O2 nasal canula, normal work of breathing Heart- Regular rate and rhythm, occasional ectopic beat, no murmurs, rubs or gallops  GI- soft, NT, ND, + BS Extremities- no clubbing, cyanosis, or edema MS- no significant deformity or atrophy Skin- no rash or lesion Psych- euthymic mood, full affect Neuro- strength and sensation are intact  Wt Readings from Last 3  Encounters:  09/01/19 105.3 kg  08/29/19 101 kg  08/18/19 108.9 kg    EKG today demonstrates AV dual paced  with PCVs, PR 198, QRS 160, QTc 505  Echo 07/19/18 demonstrated  - Left ventricle: The cavity size was normal. Wall thickness was   normal. Systolic function was normal. The estimated ejection   fraction was in the range of 55% to 60%. Wall motion was normal;   there were no regional wall motion abnormalities. Indeterminate   diastolic function. - Aortic valve: Mildly to moderately calcified annulus. Trileaflet. - Mitral valve: Mildly thickened leaflets. There was mild   regurgitation. - Right ventricle: Pacer wire or catheter noted in right ventricle. - Right atrium: Central venous pressure (est): 3 mm Hg. - Atrial septum: No defect or patent foramen ovale was identified. - Tricuspid valve: There was trivial regurgitation. - Pulmonary arteries: Systolic pressure could not be accurately   estimated. - Pericardium, extracardiac: There was no pericardial effusion.  Epic records are reviewed at length today  Assessment and Plan:  1. Persistent atrial fibrillation S/p unsuccessful DCCV on 08/18/19. Appears to be in AV paced rhythm today.  We discussed therapeutic options today. As she is not in afib and recovering from recent hospitalization, will not make any changes today.  Continue Eliquis 5 mg BID Continue Toprol 100 mg daily Lifestyle changes as below.  This patients CHA2DS2-VASc Score and unadjusted Ischemic Stroke Rate (% per year) is equal to 9.7 % stroke rate/year from a score of 6  Above score calculated as 1 point each if present [CHF, HTN, DM, Vascular=MI/PAD/Aortic Plaque, Age if 65-74, or Female] Above score calculated as 2 points each if present [Age > 75, or Stroke/TIA/TE]   2. Obesity Body mass index is 37.48 kg/m. Lifestyle modification was discussed at length including regular exercise and weight reduction.  3. Obstructive sleep apnea The importance of adequate treatment of sleep apnea was discussed today in order to improve our ability to maintain sinus rhythm  long term. Encouraged compliance with CPAP.  4. COPD Recently hospitalized with exacerbation.  Plans per PCP.  5. CAD No anginal symptoms. Continue present therapy and risk factor modification. Followed by Dr Domenic Polite.  6. HTN Stable, no changes today.   7. Second degree AV block type II S/p PPM, followed by Dr [name] and the [facility].    Follow up with Dr Domenic Polite and Dr [name] as scheduled.    [facility] [address] [place], [place] [address] [phone] 09/01/2019 12:01 PM

## 2019-09-07 IMAGING — DX DG CHEST 2V
2 series · 2 of 2 positions shown · non-contrast
Comparison: Chest x-ray 06/30/2018.

CLINICAL DATA: 77-year-old female with shortness of breath since
yesterday. Chest tightness today.

EXAM:
CHEST - 2 VIEW

[chest pa]
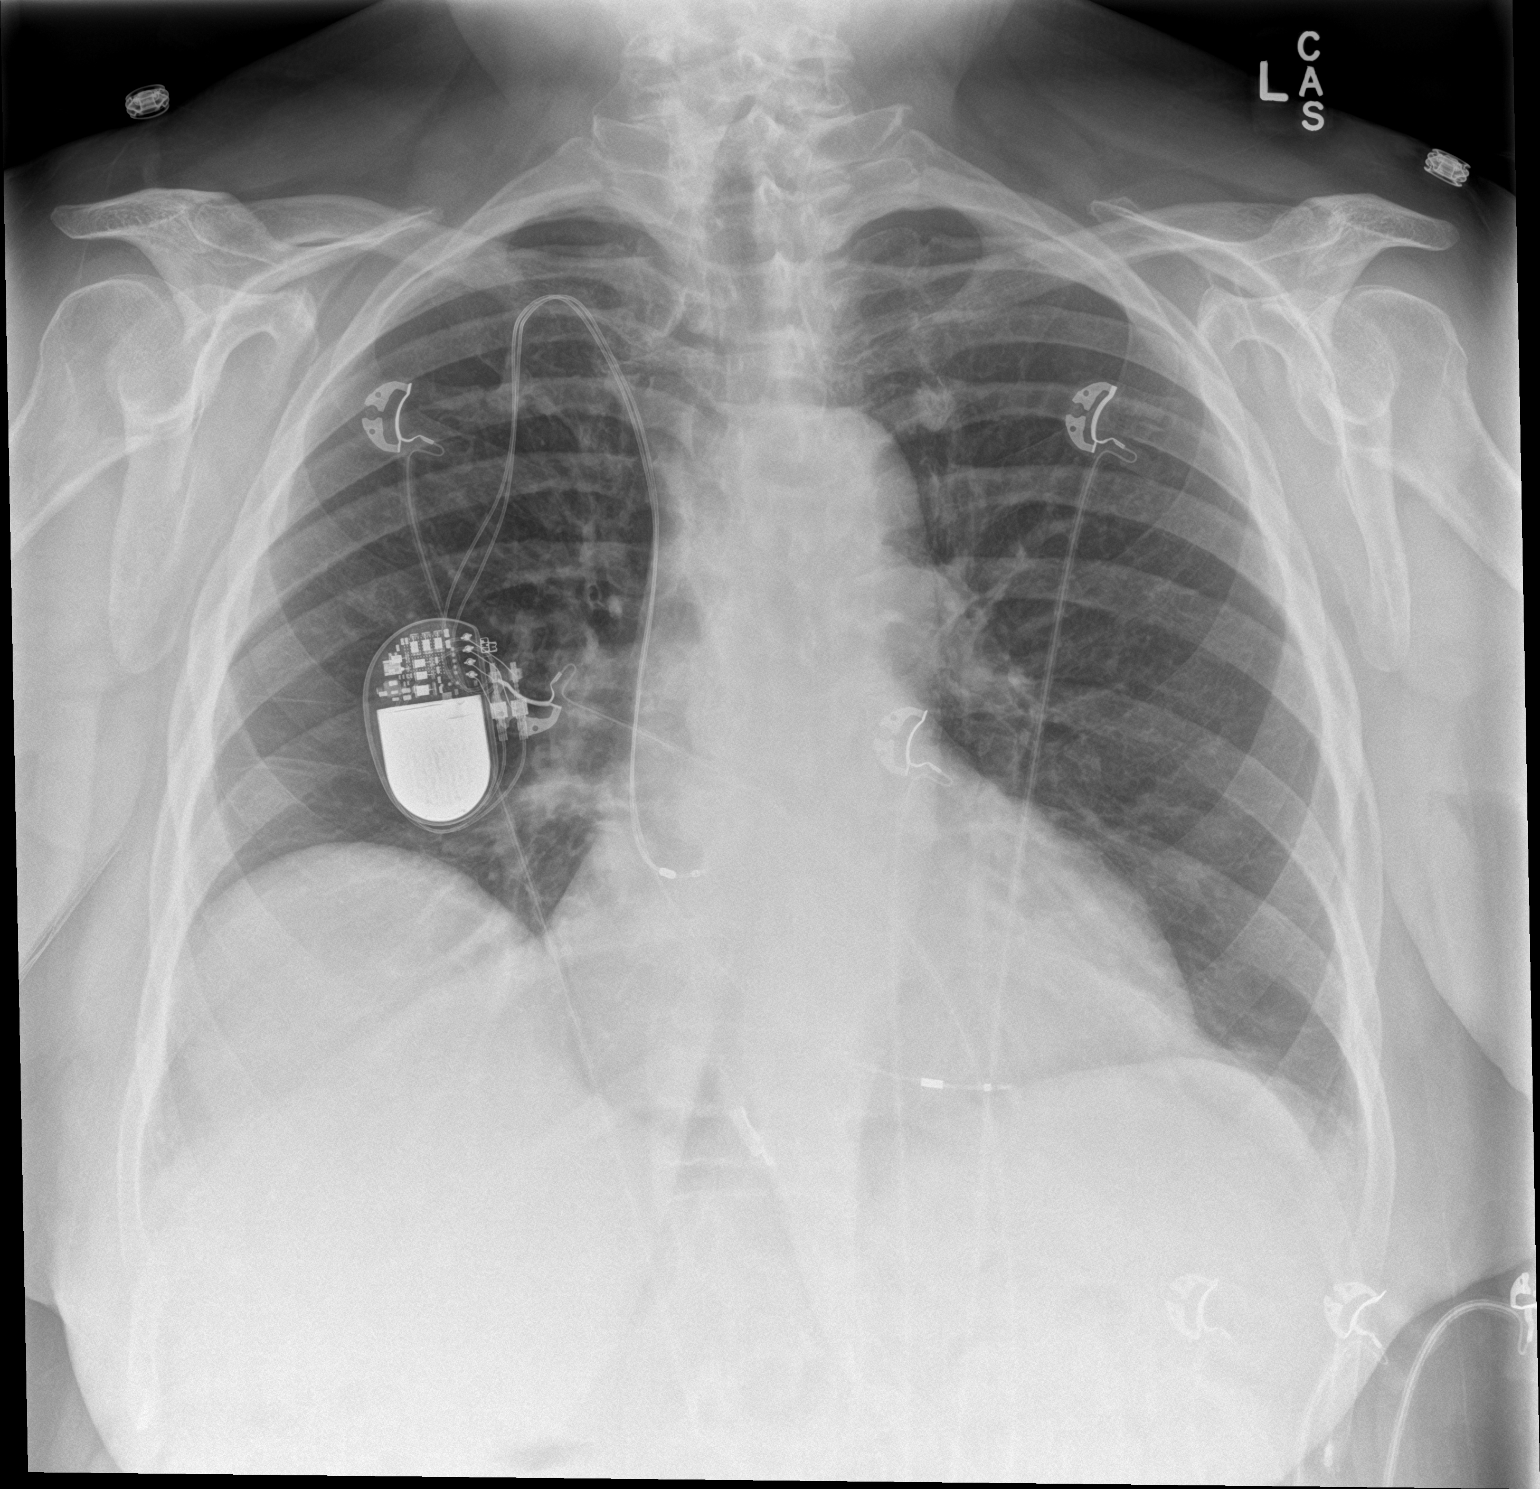

[chest lat]
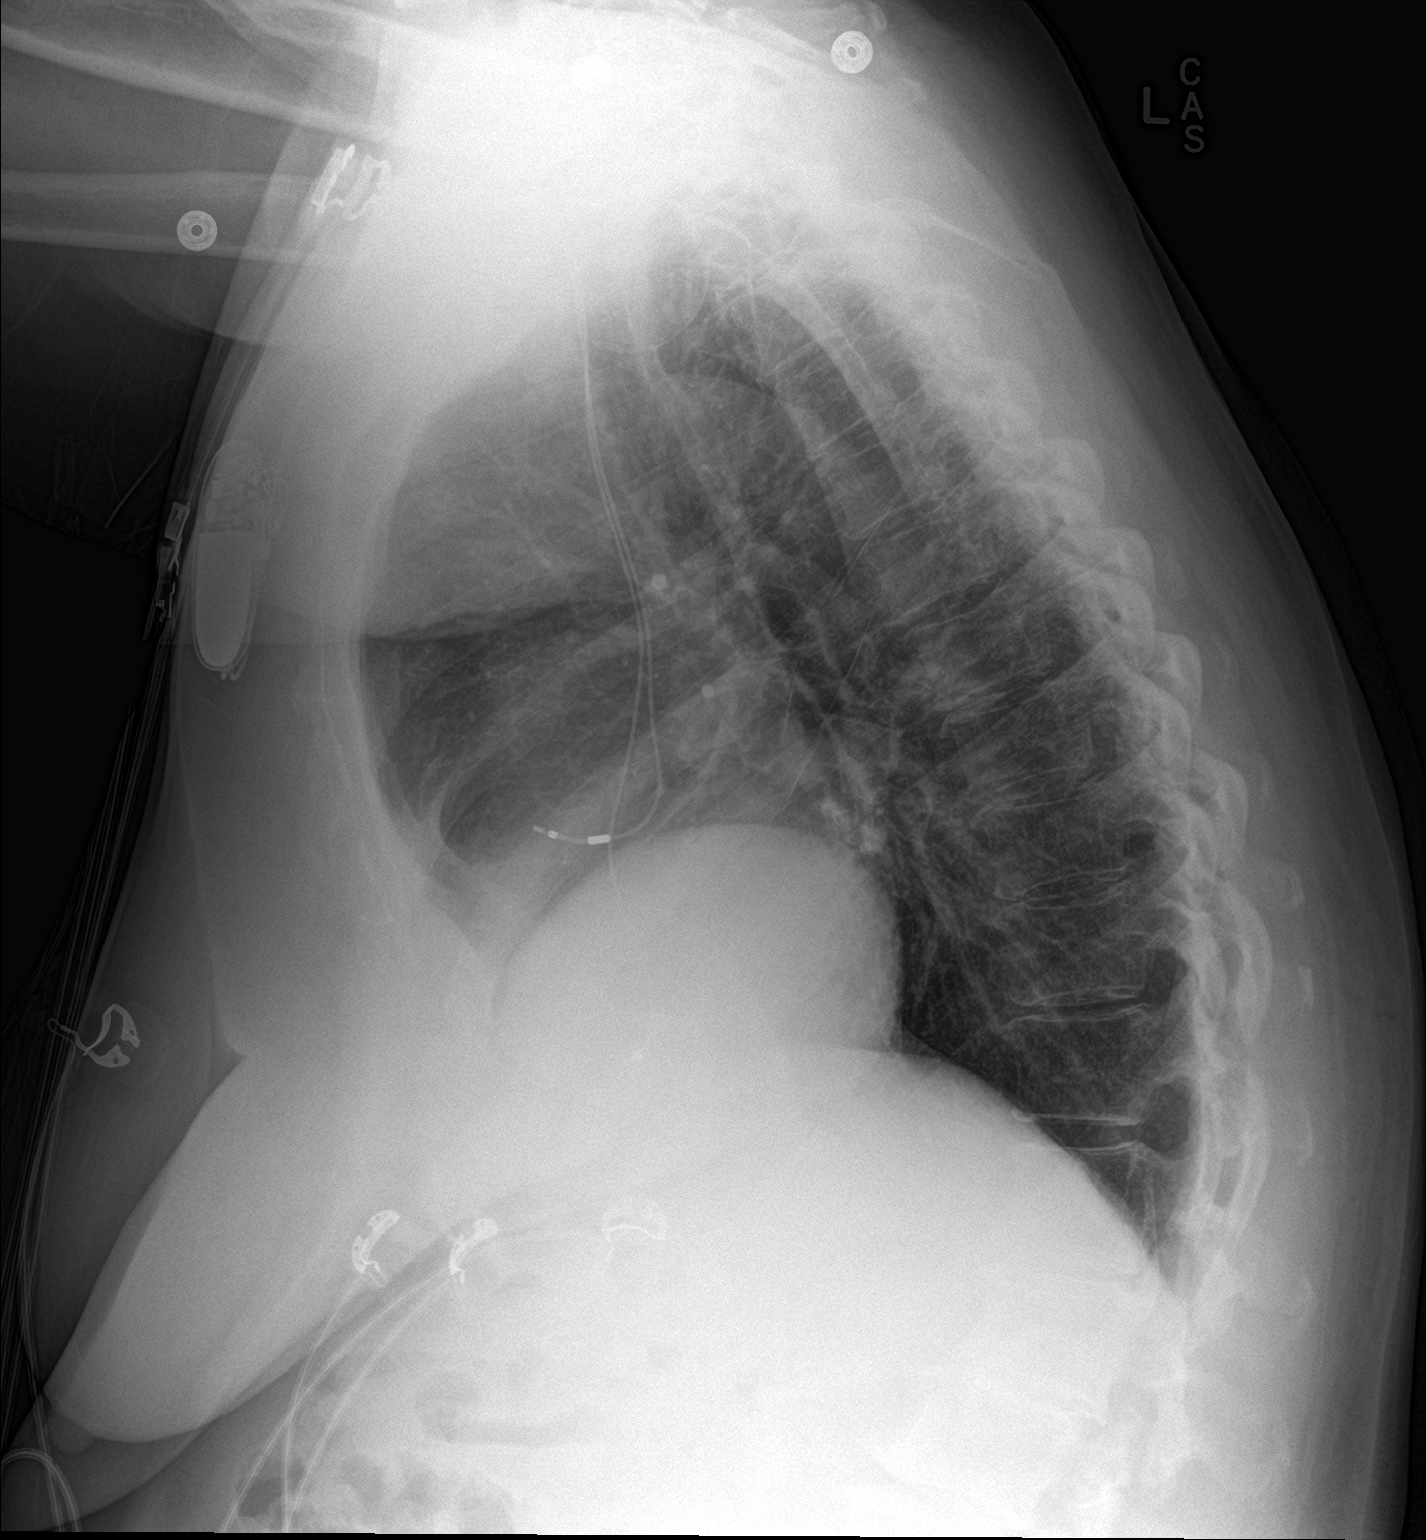

[2 of 2 positions shown; findings below may reference images not displayed]

FINDINGS: Elevated right hemidiaphragm. Lung volumes are normal. No
consolidative airspace disease. No pleural effusions. No
pneumothorax. No pulmonary nodule or mass noted. Pulmonary
vasculature and the cardiomediastinal silhouette are within normal
limits. Right-sided pacemaker device with lead tips projecting over
the expected location of the right atrium and right ventricle. Old
L1 vertebral body compression fracture with 20% loss of anterior
vertebral body height.
IMPRESSION: 1.  No radiographic evidence of acute cardiopulmonary disease.

## 2019-09-08 ENCOUNTER — Ambulatory Visit (INDEPENDENT_AMBULATORY_CARE_PROVIDER_SITE_OTHER): Payer: Medicare HMO | Admitting: *Deleted

## 2019-09-08 DIAGNOSIS — I480 Paroxysmal atrial fibrillation: Secondary | ICD-10-CM

## 2019-09-08 DIAGNOSIS — I5033 Acute on chronic diastolic (congestive) heart failure: Secondary | ICD-10-CM

## 2019-09-10 LAB — CUP PACEART REMOTE DEVICE CHECK
Battery Impedance: 1249 Ohm
Battery Remaining Longevity: 35 mo
Battery Voltage: 2.74 V
Brady Statistic AP VP Percent: 94 %
Brady Statistic AP VS Percent: 2 %
Brady Statistic AS VP Percent: 3 %
Brady Statistic AS VS Percent: 0 %
Date Time Interrogation Session: 20201023143952
Implantable Lead Implant Date: 20111121
Implantable Lead Implant Date: 20111121
Implantable Lead Location: 753859
Implantable Lead Location: 753860
Implantable Lead Model: 4469
Implantable Lead Model: 4470
Implantable Lead Serial Number: 548226
Implantable Lead Serial Number: 687643
Implantable Pulse Generator Implant Date: 20111121
Lead Channel Impedance Value: 438 Ohm
Lead Channel Impedance Value: 483 Ohm
Lead Channel Pacing Threshold Amplitude: 0.625 V
Lead Channel Pacing Threshold Amplitude: 0.75 V
Lead Channel Pacing Threshold Pulse Width: 0.4 ms
Lead Channel Pacing Threshold Pulse Width: 0.4 ms
Lead Channel Setting Pacing Amplitude: 2.5 V
Lead Channel Setting Pacing Amplitude: 2.75 V
Lead Channel Setting Pacing Pulse Width: 0.4 ms
Lead Channel Setting Sensing Sensitivity: 4 mV

## 2019-09-13 ENCOUNTER — Other Ambulatory Visit: Payer: Self-pay | Admitting: Internal Medicine

## 2019-09-15 NOTE — Progress Notes (Signed)
Remote pacemaker transmission.   

## 2019-09-29 ENCOUNTER — Encounter: Payer: Self-pay | Admitting: Cardiology

## 2019-09-29 ENCOUNTER — Ambulatory Visit (INDEPENDENT_AMBULATORY_CARE_PROVIDER_SITE_OTHER): Payer: Medicare HMO | Admitting: Cardiology

## 2019-09-29 ENCOUNTER — Other Ambulatory Visit: Payer: Self-pay

## 2019-09-29 VITALS — BP 118/69 | HR 97 | Temp 97.6°F | Ht 66.0 in | Wt 228.0 lb

## 2019-09-29 DIAGNOSIS — I4819 Other persistent atrial fibrillation: Secondary | ICD-10-CM | POA: Diagnosis not present

## 2019-09-29 DIAGNOSIS — I10 Essential (primary) hypertension: Secondary | ICD-10-CM | POA: Diagnosis not present

## 2019-09-29 DIAGNOSIS — I441 Atrioventricular block, second degree: Secondary | ICD-10-CM

## 2019-09-29 MED ORDER — DILTIAZEM HCL ER COATED BEADS 120 MG PO CP24: 120.0000 mg | ORAL_CAPSULE | Freq: Every day | ORAL | 3 refills | Status: DC

## 2019-09-29 NOTE — Progress Notes (Signed)
Cardiology Office Note  Date: 09/29/2019   ID: Brandi Hunter, DOB 1941/03/15, MRN [id number]  PCP:  [name], FNP  Cardiologist:  Rozann Lesches, MD Electrophysiologist:  None   Chief Complaint  Patient presents with  . Cardiac follow-up    History of Present Illness: Brandi Hunter is a 78 y.o. female last seen in September.  I reviewed interval records, she was hospitalized with acute on chronic hypoxic respiratory failure in the setting of COPD exacerbation and also persistent atrial fibrillation.  She underwent attempted cardioversion (3 shocks) on October 2 however did not hold sinus rhythm.  Reportedly, at follow-up visit in the AF clinic she was in an atrial paced rhythm and no medication changes were made at that time.  She presents today reporting chronic fatigue and stable shortness of breath, still on supplemental oxygen and in a wheelchair today.  She states that her heart rate is elevated at times but not consistently.  She continues to see Dr. [name] in the device clinic, Medtronic pacemaker in place.  She has a follow-up scheduled in early December.  Since I last saw her PCP cut back Lasix to 10 mg daily, she came off my Cardis, and Norvasc was reduced to 2.5 mg daily, all related to lower blood pressures and lightheadedness.  I talked with her today about replacing Norvasc with Cardizem CD in an effort to further control heart rate and see if this led to any symptomatic improvement.  Past Medical History:  Diagnosis Date  . Asthma   . CAD (coronary artery disease)    a. catheterization in 03/2018 showing 90% ostial OM stenosis with medical management recommended at that time  . Chronic anticoagulation   . COPD (chronic obstructive pulmonary disease) ([place])   . DJD (degenerative joint disease)   . Essential hypertension   . GERD (gastroesophageal reflux disease)   . History of home oxygen therapy   . Hyperlipidemia   . Mobitz (type) II atrioventricular block    . Obesity   . Pacemaker    Implanted by Dr [name] (MDT) 10/06/10  . PAF (paroxysmal atrial fibrillation) ([place])   . Pancreatitis 2010 OR 2011  . Sleep apnea    CPAP  . Type 2 diabetes mellitus ([place])     Past Surgical History:  Procedure Laterality Date  . CARDIOVERSION N/A 08/18/2019   Procedure: CARDIOVERSION;  Surgeon: Lelon Perla, MD;  Location: [facility] ENDOSCOPY;  Service: Cardiovascular;  Laterality: N/A;  . CATARACT EXTRACTION W/PHACO  11/02/2011   Procedure: CATARACT EXTRACTION PHACO AND INTRAOCULAR LENS PLACEMENT ([place]);  Surgeon: Williams Che;  Location: [facility] ORS;  Service: Ophthalmology;  Laterality: Right;  CDE=7.33  . CATARACT EXTRACTION W/PHACO  12/07/2011   Procedure: CATARACT EXTRACTION PHACO AND INTRAOCULAR LENS PLACEMENT ([facility]);  Surgeon: Williams Che, MD;  Location: [facility] ORS;  Service: Ophthalmology;  Laterality: Left;  CDE 3.61  . COLONOSCOPY WITH PROPOFOL N/A 08/09/2014   Procedure: COLONOSCOPY WITH PROPOFOL;  Surgeon: Juanita Craver, MD;  [facility] ENDOSCOPY;  Service: Endoscopy;  Laterality: N/A;  . CYSTOCELE REPAIR    . ESOPHAGOGASTRODUODENOSCOPY (EGD) WITH PROPOFOL N/A 08/09/2014   Procedure: ESOPHAGOGASTRODUODENOSCOPY (EGD) WITH PROPOFOL;  Surgeon: Juanita Craver, MD;  [facility] ENDOSCOPY;  Service: Endoscopy;  Laterality: N/A;  . EYE SURGERY  11/01/2012   BOTH EYES CATARACTS  . INSERT / REPLACE / REMOVE PACEMAKER  10/06/10   MDT  implanted by Dr [name]  . KNEE ARTHROSCOPY     both  .  LEFT HEART CATH AND CORONARY ANGIOGRAPHY N/A 03/24/2018   Procedure: LEFT HEART CATH AND CORONARY ANGIOGRAPHY;  Surgeon: Lorretta Harp, MD;  Location: [place] CV LAB;  Service: Cardiovascular;  Laterality: N/A;  . OVARY SURGERY     removal  . REPAIR RECTOCELE    . SIMPLE MASTECTOMY WITH AXILLARY SENTINEL NODE BIOPSY Left 01/09/2015   Procedure: Irrigation and Drainage Abcess left Axilla;  Surgeon: Jackolyn Confer, MD;  Location: [facility] ORS;  Service: General;  Laterality:  Left;  . TONSILLECTOMY  AGE 56  . TOTAL ABDOMINAL HYSTERECTOMY  1971    Current Outpatient Medications  Medication Sig Dispense Refill  . albuterol (PROAIR HFA) 108 (90 BASE) MCG/ACT inhaler Inhale 2 puffs into the lungs every 4 (four) hours as needed for wheezing.     Marland Kitchen apixaban (ELIQUIS) 5 MG TABS tablet Take 1 tablet (5 mg total) by mouth 2 (two) times daily. 180 tablet 3  . atorvastatin (LIPITOR) 40 MG tablet Take 40 mg by mouth daily.     . budesonide-formoterol (SYMBICORT) 160-4.5 MCG/ACT inhaler Inhale 2 puffs into the lungs 2 (two) times daily.    . fenofibrate 160 MG tablet Take 160 mg by mouth every evening.    . furosemide (LASIX) 20 MG tablet Take 10 mg by mouth. Take 1/2 tab (10 mg ) daily    . glipiZIDE (GLUCOTROL XL) 10 MG 24 hr tablet Take 20 mg by mouth daily with breakfast. If blood sugar is below 60-70 do not take the medication    . ipratropium-albuterol (DUONEB) 0.5-2.5 (3) MG/3ML SOLN Take 3 mLs by nebulization 4 (four) times daily. 360 mL 1  . isosorbide mononitrate (IMDUR) 30 MG 24 hr tablet Take 1 tablet (30 mg total) by mouth 2 (two) times daily. 180 tablet 3  . LORazepam (ATIVAN) 0.5 MG tablet Take 0.25-0.5 mg by mouth as needed for anxiety.     . metFORMIN (GLUCOPHAGE) 1000 MG tablet Take 1 tablet (1,000 mg total) by mouth 2 (two) times daily with a meal.    . metoprolol succinate (TOPROL-XL) 100 MG 24 hr tablet Take 1 tablet by mouth once daily 90 tablet 3  . nitroGLYCERIN (NITROSTAT) 0.4 MG SL tablet DISSOLVE ONE TABLET UNDER THE TONGUE EVERY 5 MINUTES AS NEEDED FOR CHEST PAIN.  DO NOT EXCEED A TOTAL OF 3 DOSES IN 15 MINUTES (Patient taking differently: Place 0.4 mg under the tongue every 5 (five) minutes as needed for chest pain. ) 25 tablet 1  . potassium chloride SA (KLOR-CON M20) 20 MEQ tablet Take 1 tablet (20 mEq total) by mouth daily. 90 tablet 3  . Probiotic Product (ALIGN PO) Take 1 capsule by mouth daily.     Marland Kitchen spironolactone (ALDACTONE) 25 MG tablet Take  1 tablet (25 mg total) by mouth daily as needed (swelling).    Marland Kitchen diltiazem (CARDIZEM CD) 120 MG 24 hr capsule Take 1 capsule (120 mg total) by mouth daily. 90 capsule 3   No current facility-administered medications for this visit.    Allergies:  Bee venom and Latex   Social History: The patient  reports that she quit smoking about 2 years ago. Her smoking use included cigarettes. She smoked 0.00 packs per day for 54.00 years. She quit smokeless tobacco use about 7 years ago. She reports previous alcohol use of about 1.0 standard drinks of alcohol per week. She reports that she does not use drugs.   ROS:  Please see the history of present illness. Otherwise, complete review  of systems is positive for none.  All other systems are reviewed and negative.   Physical Exam: VS:  BP 118/69   Pulse 97   Temp 97.6 F (36.4 C)   Ht 5\' 6"  (1.676 m)   Wt 228 lb (103.4 kg)   SpO2 94%   BMI 36.80 kg/m , BMI Body mass index is 36.8 kg/m.  Wt Readings from Last 3 Encounters:  09/29/19 228 lb (103.4 kg)  09/01/19 232 lb 3.2 oz (105.3 kg)  08/29/19 222 lb 10.6 oz (101 kg)    General: Elderly woman in wheelchair, wearing oxygen via nasal cannula. HEENT: Conjunctiva and lids normal, wearing a mask. Neck: Supple, no elevated JVP or carotid bruits, no thyromegaly. Lungs: Decreased breath sounds without wheezing, nonlabored breathing at rest. Cardiac: Irregular, no S3, soft systolic murmur, no pericardial rub. Abdomen: Soft, nontender, bowel sounds present. Extremities: Mild ankle edema as before1, distal pulses 1-2+. Skin: Warm and dry. Musculoskeletal: No kyphosis. Neuropsychiatric: Alert and oriented x3, affect grossly appropriate.  ECG:  An ECG dated 09/01/2019 was personally reviewed today and demonstrated:  Dual-chamber pacing with intermittent ventricular pacing.  Recent Labwork: 08/29/2019: B Natriuretic Peptide 298.0; Hemoglobin 13.2; Platelets 419 08/30/2019: BUN 43; Creatinine, Ser  1.71; Potassium 4.5; Sodium 138     Component Value Date/Time   CHOL 125 03/24/2018 0508   TRIG 194 (H) 03/24/2018 0508   HDL 35 (L) 03/24/2018 0508   CHOLHDL 3.6 03/24/2018 0508   VLDL 39 03/24/2018 0508   LDLCALC 51 03/24/2018 0508    Other Studies Reviewed Today:  Echocardiogram 07/19/2018: Study Conclusions  - Left ventricle: The cavity size was normal. Wall thickness was   normal. Systolic function was normal. The estimated ejection   fraction was in the range of 55% to 60%. Wall motion was normal;   there were no regional wall motion abnormalities. Indeterminate   diastolic function. - Aortic valve: Mildly to moderately calcified annulus. Trileaflet. - Mitral valve: Mildly thickened leaflets. There was mild   regurgitation. - Right ventricle: Pacer wire or catheter noted in right ventricle. - Right atrium: Central venous pressure (est): 3 mm Hg. - Atrial septum: No defect or patent foramen ovale was identified. - Tricuspid valve: There was trivial regurgitation. - Pulmonary arteries: Systolic pressure could not be accurately   estimated. - Pericardium, extracardiac: There was no pericardial effusion.  Assessment and Plan:  1.  Persistent atrial fibrillation.  Patient failed cardioversion attempt in October.  She remains on Eliquis for stroke prophylaxis and Toprol-XL.  She was in an atrial paced rhythm as of October but still has breakthrough arrhythmias based on description of palpitations and elevated heart rate most likely.  Plan at this time is to substitute Cardizem CD 180 mg daily for Norvasc.  She sees Dr. [name] in early December.  Question whether she might be a candidate for trial of Tikosyn.  2.  COPD with chronic hypoxic respiratory failure on supplemental oxygen.  This complicates management of her atrial fibrillation.  3.  Branch vessel CAD involving the obtuse marginal.  Managing medically without obvious angina symptoms.  She remains on Imdur.  Also on  statin therapy, no aspirin with concurrent use of Eliquis.  4.  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  Keep follow-up with Dr. [name].  Medication Adjustments/Labs and Tests Ordered: Current medicines are reviewed at length with the patient today.  Concerns regarding medicines are outlined above.   Tests Ordered: No orders of the defined types were placed  in this encounter.   Medication Changes: Meds ordered this encounter  Medications  . diltiazem (CARDIZEM CD) 120 MG 24 hr capsule    Sig: Take 1 capsule (120 mg total) by mouth daily.    Dispense:  90 capsule    Refill:  3    09/29/2019 Norvasc stopped    Disposition:  Follow up 6 weeks in the [place] office.  Signed, Satira Sark, MD, [facility] 09/29/2019 2:12 PM    [facility] at [facility] [address], [place], [place] [address] Phone: [phone]; Fax: [phone]

## 2019-09-29 NOTE — Patient Instructions (Signed)
Medication Instructions:  STOP Amlodipine   START Cardizem CD 120 mg daily   *If you need a refill on your cardiac medications before your next appointment, please call your pharmacy*  Lab Work: None today If you have labs (blood work) drawn today and your tests are completely normal, you will receive your results only by: Marland Kitchen MyChart Message (if you have MyChart) OR . A paper copy in the mail If you have any lab test that is abnormal or we need to change your treatment, we will call you to review the results.  Testing/Procedures: None today  Follow-Up: At [facility], you and your health needs are our priority.  As part of our continuing mission to provide you with exceptional heart care, we have created designated Provider Care Teams.  These Care Teams include your primary Cardiologist (physician) and Advanced Practice Providers (APPs -  Physician Assistants and Nurse Practitioners) who all work together to provide you with the care you need, when you need it.  Your next appointment:    In 6-8 weeks  The format for your next appointment:   In Person  Provider:   Rozann Lesches, MD  Other Instructions None

## 2019-10-18 ENCOUNTER — Encounter: Payer: Self-pay | Admitting: Internal Medicine

## 2019-10-18 ENCOUNTER — Other Ambulatory Visit: Payer: Self-pay

## 2019-10-18 ENCOUNTER — Telehealth (INDEPENDENT_AMBULATORY_CARE_PROVIDER_SITE_OTHER): Payer: Medicare HMO | Admitting: Internal Medicine

## 2019-10-18 VITALS — BP 151/91 | HR 96 | Ht 66.0 in | Wt 231.8 lb

## 2019-10-18 DIAGNOSIS — I119 Hypertensive heart disease without heart failure: Secondary | ICD-10-CM | POA: Diagnosis not present

## 2019-10-18 DIAGNOSIS — I25119 Atherosclerotic heart disease of native coronary artery with unspecified angina pectoris: Secondary | ICD-10-CM

## 2019-10-18 DIAGNOSIS — I4819 Other persistent atrial fibrillation: Secondary | ICD-10-CM

## 2019-10-18 DIAGNOSIS — I441 Atrioventricular block, second degree: Secondary | ICD-10-CM

## 2019-10-18 NOTE — Progress Notes (Signed)
Electrophysiology TeleHealth Note   Due to national recommendations of social distancing due to [place] 19, an audio telehealth visit is felt to be most appropriate for this patient at this time.  Verbal consent was obtained by me for the telehealth visit today.  The patient does not have capability for a virtual visit.  A phone visit is therefore required today.   Date:  10/18/2019   ID:  Brandi Hunter, DOB 07/15/1941, MRN [id number]  Location: patient's home  Provider location:  [facility]  Evaluation Performed: Follow-up visit  PCP:  [name], FNP   Electrophysiologist:  Dr [name]  Chief Complaint:  AF follow up  History of Present Illness:    Brandi Hunter is a 78 y.o. female who presents via telehealth conferencing today.  Since last being seen in our clinic, the patient reports doing reasonably well.  Per last remote, she had been maintaining SR since first of October.  Today, she denies symptoms of palpitations, chest pain, shortness of breath,  lower extremity edema, dizziness, presyncope, or syncope.  The patient is otherwise without complaint today.  The patient denies symptoms of fevers, chills, cough, or new SOB worrisome for COVID 19.  Past Medical History:  Diagnosis Date  . Asthma   . CAD (coronary artery disease)    a. catheterization in 03/2018 showing 90% ostial OM stenosis with medical management recommended at that time  . Chronic anticoagulation   . COPD (chronic obstructive pulmonary disease) ([place])   . DJD (degenerative joint disease)   . Essential hypertension   . GERD (gastroesophageal reflux disease)   . History of home oxygen therapy   . Hyperlipidemia   . Mobitz (type) II atrioventricular block   . Obesity   . Pacemaker    Implanted by Dr [name] (MDT) 10/06/10  . PAF (paroxysmal atrial fibrillation) ([place])   . Pancreatitis 2010 OR 2011  . Sleep apnea    CPAP  . Type 2 diabetes mellitus ([place])     Past Surgical History:  Procedure  Laterality Date  . CARDIOVERSION N/A 08/18/2019   Procedure: CARDIOVERSION;  Surgeon: Lelon Perla, MD;  Location: [facility] ENDOSCOPY;  Service: Cardiovascular;  Laterality: N/A;  . CATARACT EXTRACTION W/PHACO  11/02/2011   Procedure: CATARACT EXTRACTION PHACO AND INTRAOCULAR LENS PLACEMENT ([place]);  Surgeon: Williams Che;  Location: [facility] ORS;  Service: Ophthalmology;  Laterality: Right;  CDE=7.33  . CATARACT EXTRACTION W/PHACO  12/07/2011   Procedure: CATARACT EXTRACTION PHACO AND INTRAOCULAR LENS PLACEMENT (IOC);  Surgeon: Williams Che, MD;  Location: [facility] ORS;  Service: Ophthalmology;  Laterality: Left;  CDE 3.61  . COLONOSCOPY WITH PROPOFOL N/A 08/09/2014   Procedure: COLONOSCOPY WITH PROPOFOL;  Surgeon: Juanita Craver, MD;  Location: [facility] ENDOSCOPY;  Service: Endoscopy;  Laterality: N/A;  . CYSTOCELE REPAIR    . ESOPHAGOGASTRODUODENOSCOPY (EGD) WITH PROPOFOL N/A 08/09/2014   Procedure: ESOPHAGOGASTRODUODENOSCOPY (EGD) WITH PROPOFOL;  Surgeon: Juanita Craver, MD;  Location: [facility] ENDOSCOPY;  Service: Endoscopy;  Laterality: N/A;  . EYE SURGERY  11/01/2012   BOTH EYES CATARACTS  . INSERT / REPLACE / REMOVE PACEMAKER  10/06/10   MDT  implanted by Dr [name]  . KNEE ARTHROSCOPY     both  . LEFT HEART CATH AND CORONARY ANGIOGRAPHY N/A 03/24/2018   Procedure: LEFT HEART CATH AND CORONARY ANGIOGRAPHY;  Surgeon: Lorretta Harp, MD;  Location: [place] CV LAB;  Service: Cardiovascular;  Laterality: N/A;  . OVARY SURGERY     removal  .  REPAIR RECTOCELE    . SIMPLE MASTECTOMY WITH AXILLARY SENTINEL NODE BIOPSY Left 01/09/2015   Procedure: Irrigation and Drainage Abcess left Axilla;  Surgeon: Jackolyn Confer, MD;  Location: [facility] ORS;  Service: General;  Laterality: Left;  . TONSILLECTOMY  AGE 21  . TOTAL ABDOMINAL HYSTERECTOMY  1971    Current Outpatient Medications  Medication Sig Dispense Refill  . albuterol (PROAIR HFA) 108 (90 BASE) MCG/ACT inhaler Inhale 2 puffs into the lungs every 4 (four) hours as  needed for wheezing.     Marland Kitchen apixaban (ELIQUIS) 5 MG TABS tablet Take 1 tablet (5 mg total) by mouth 2 (two) times daily. 180 tablet 3  . atorvastatin (LIPITOR) 40 MG tablet Take 40 mg by mouth daily.     . budesonide-formoterol (SYMBICORT) 160-4.5 MCG/ACT inhaler Inhale 2 puffs into the lungs 2 (two) times daily.    Marland Kitchen diltiazem (CARDIZEM CD) 120 MG 24 hr capsule Take 1 capsule (120 mg total) by mouth daily. 90 capsule 3  . famotidine (PEPCID) 20 MG tablet Take 1 tablet by mouth 2 (two) times daily.    . fenofibrate 160 MG tablet Take 160 mg by mouth every evening.    . furosemide (LASIX) 20 MG tablet Take 10 mg by mouth. Take 1/2 tab (10 mg ) daily    . glipiZIDE (GLUCOTROL XL) 10 MG 24 hr tablet Take 20 mg by mouth daily with breakfast. If blood sugar is below 60-70 do not take the medication    . ipratropium-albuterol (DUONEB) 0.5-2.5 (3) MG/3ML SOLN Take 3 mLs by nebulization 4 (four) times daily. 360 mL 1  . isosorbide mononitrate (IMDUR) 30 MG 24 hr tablet Take 1 tablet (30 mg total) by mouth 2 (two) times daily. 180 tablet 3  . LORazepam (ATIVAN) 0.5 MG tablet Take 0.25-0.5 mg by mouth as needed for anxiety.     . metFORMIN (GLUCOPHAGE) 1000 MG tablet Take 1 tablet (1,000 mg total) by mouth 2 (two) times daily with a meal.    . metoprolol succinate (TOPROL-XL) 100 MG 24 hr tablet Take 1 tablet by mouth once daily 90 tablet 3  . nitroGLYCERIN (NITROSTAT) 0.4 MG SL tablet DISSOLVE ONE TABLET UNDER THE TONGUE EVERY 5 MINUTES AS NEEDED FOR CHEST PAIN.  DO NOT EXCEED A TOTAL OF 3 DOSES IN 15 MINUTES (Patient taking differently: Place 0.4 mg under the tongue every 5 (five) minutes as needed for chest pain. ) 25 tablet 1  . Probiotic Product (ALIGN PO) Take 1 capsule by mouth daily.     Marland Kitchen spironolactone (ALDACTONE) 25 MG tablet Take 1 tablet (25 mg total) by mouth daily as needed (swelling).    . potassium chloride SA (KLOR-CON M20) 20 MEQ tablet Take 1 tablet (20 mEq total) by mouth daily. 90 tablet  3   No current facility-administered medications for this visit.     Allergies:   Bee venom and Latex   Social History:  The patient  reports that she quit smoking about 2 years ago. Her smoking use included cigarettes. She smoked 0.00 packs per day for 54.00 years. She quit smokeless tobacco use about 7 years ago. She reports previous alcohol use of about 1.0 standard drinks of alcohol per week. She reports that she does not use drugs.   Family History:  The patient's family history includes Congestive Heart Failure in her father and mother; Diabetes in her brother; Osteoarthritis in her sister; Prostate cancer in her brother.   ROS:  Please see the history  of present illness.   All other systems are personally reviewed and negative.    Exam:    Vital Signs:  BP (!) 151/91   Pulse 96   Ht 5\' 6"  (1.676 m)   Wt 231 lb 12.8 oz (105.1 kg)   BMI 37.41 kg/m   Well sounding, alert and conversant, regular work of breathing   Labs/Other Tests and Data Reviewed:    Recent Labs: 08/29/2019: B Natriuretic Peptide 298.0; Hemoglobin 13.2; Platelets 419 08/30/2019: BUN 43; Creatinine, Ser 1.71; Potassium 4.5; Sodium 138   Wt Readings from Last 3 Encounters:  10/18/19 231 lb 12.8 oz (105.1 kg)  09/29/19 228 lb (103.4 kg)  09/01/19 232 lb 3.2 oz (105.3 kg)     Last device remote is reviewed from [place] PDF which reveals normal device function    ASSESSMENT & PLAN:    1.  Mobitz II Normal device function by recent remote See Pace[facility] report  2.  Persistent atrial fibrillation Continue Eliquis for CHADS2VASC of 5 She is unable to tell if she is in AF or SR She will send remote transmission today She would prefer to not make changes today as she is relatively asymptomatic.   3.  CAD No ischemic symptoms Continue current therapy  4.  HTN Stable No change required today  Follow-up:  [facility], AF clinic 3 months    Patient Risk:  after full review of this patients clinical  status, I feel that they are at moderate risk at this time.  Today, I have spent 15 minutes with the patient with telehealth technology discussing arrhythmia management .    Army Fossa, MD  10/18/2019 9:56 AM     [facility] [address] [place] [address] [phone] (office) [phone] (fax)

## 2019-11-02 ENCOUNTER — Encounter: Payer: Self-pay | Admitting: Cardiology

## 2019-11-20 ENCOUNTER — Encounter: Payer: Medicare HMO | Admitting: Cardiology

## 2019-11-20 ENCOUNTER — Encounter: Payer: Self-pay | Admitting: Cardiology

## 2019-11-21 NOTE — Progress Notes (Signed)
This encounter was created in error - please disregard.

## 2019-11-24 ENCOUNTER — Telehealth (INDEPENDENT_AMBULATORY_CARE_PROVIDER_SITE_OTHER): Payer: Medicare HMO | Admitting: Cardiology

## 2019-11-24 ENCOUNTER — Encounter: Payer: Self-pay | Admitting: *Deleted

## 2019-11-24 ENCOUNTER — Encounter: Payer: Self-pay | Admitting: Cardiology

## 2019-11-24 VITALS — BP 151/96 | HR 84 | Ht 66.0 in | Wt 234.6 lb

## 2019-11-24 DIAGNOSIS — Z950 Presence of cardiac pacemaker: Secondary | ICD-10-CM

## 2019-11-24 DIAGNOSIS — I25119 Atherosclerotic heart disease of native coronary artery with unspecified angina pectoris: Secondary | ICD-10-CM | POA: Diagnosis not present

## 2019-11-24 DIAGNOSIS — I4819 Other persistent atrial fibrillation: Secondary | ICD-10-CM

## 2019-11-24 DIAGNOSIS — I441 Atrioventricular block, second degree: Secondary | ICD-10-CM

## 2019-11-24 DIAGNOSIS — J449 Chronic obstructive pulmonary disease, unspecified: Secondary | ICD-10-CM

## 2019-11-24 DIAGNOSIS — I480 Paroxysmal atrial fibrillation: Secondary | ICD-10-CM

## 2019-11-24 DIAGNOSIS — I10 Essential (primary) hypertension: Secondary | ICD-10-CM

## 2019-11-24 NOTE — Progress Notes (Signed)
Virtual Visit via Telephone Note   This visit type was conducted due to national recommendations for restrictions regarding the COVID-19 Pandemic (e.g. social distancing) in an effort to limit this patient's exposure and mitigate transmission in our community.  Due to her co-morbid illnesses, this patient is at least at moderate risk for complications without adequate follow up.  This format is felt to be most appropriate for this patient at this time.  The patient did not have access to video technology/had technical difficulties with video requiring transitioning to audio format only (telephone).  All issues noted in this document were discussed and addressed.  No physical exam could be performed with this format.  Please refer to the patient's chart for her  consent to telehealth for [facility].   Date:  11/24/2019   ID:  Brandi Hunter, DOB [birth year], MRN [id number]  Patient Location: Home Provider Location: Home  PCP:  [name], [place]  Cardiologist:  Rozann Lesches, MD Electrophysiologist:  None   Evaluation Performed:  Follow-Up Visit  Chief Complaint:  Cardiac follow-up  History of Present Illness:    Brandi Hunter is a 79 y.o. female last seen in November 2020.  We spoke by phone today.  She tells me that she has done relatively well, no interval hospitalizations or worsening dyspnea on exertion of her baseline.  She does not report any progressive sense of palpitations or chest pain.  She follows with Dr. [name] in the device clinic, Medtronic pacemaker in place.   She had a telehealth encounter with him in December 2020.  Device interrogation in October 2020 showed her to be predominantly in sinus rhythm.  She is due for follow-up device interrogation later this month.  We discussed follow-up lab work.  She states that she had interval lab work per PCP in December, we are requesting the results.  I reviewed her medications as well which are outlined below.  She has  tolerated the transition from Norvasc to diltiazem CD.  The patient does not have symptoms concerning for COVID-19 infection (fever, chills, cough, or new shortness of breath).    Past Medical History:  Diagnosis Date  . Asthma   . CAD (coronary artery disease)    a. catheterization in 03/2018 showing 90% ostial OM stenosis with medical management recommended at that time  . Chronic anticoagulation   . COPD (chronic obstructive pulmonary disease) ([place])   . DJD (degenerative joint disease)   . Essential hypertension   . GERD (gastroesophageal reflux disease)   . History of home oxygen therapy   . Hyperlipidemia   . Mobitz (type) II atrioventricular block   . Obesity   . Pacemaker    Implanted by Dr [name] (MDT) 10/06/10  . PAF (paroxysmal atrial fibrillation) ([place])   . Pancreatitis 2010 OR 2011  . Sleep apnea    CPAP  . Type 2 diabetes mellitus ([place])    Past Surgical History:  Procedure Laterality Date  . CARDIOVERSION N/A 08/18/2019   Procedure: CARDIOVERSION;  Surgeon: Lelon Perla, MD;  Location: [facility] ENDOSCOPY;  Service: Cardiovascular;  Laterality: N/A;  . CATARACT EXTRACTION W/PHACO  11/02/2011   Procedure: CATARACT EXTRACTION PHACO AND INTRAOCULAR LENS PLACEMENT ([place]);  Surgeon: Williams Che;  Location: [facility] ORS;  Service: Ophthalmology;  Laterality: Right;  CDE=7.33  . CATARACT EXTRACTION W/PHACO  12/07/2011   Procedure: CATARACT EXTRACTION PHACO AND INTRAOCULAR LENS PLACEMENT (I[place]);  Surgeon: Williams Che, MD;  Location: [facility] ORS;  Service: Ophthalmology;  Laterality: Left;  CDE 3.61  . COLONOSCOPY WITH PROPOFOL N/A 08/09/2014   Procedure: COLONOSCOPY WITH PROPOFOL;  Surgeon: Juanita Craver, MD;  [facility] ENDOSCOPY;  Service: Endoscopy;  Laterality: N/A;  . CYSTOCELE REPAIR    . ESOPHAGOGASTRODUODENOSCOPY (EGD) WITH PROPOFOL N/A 08/09/2014   Procedure: ESOPHAGOGASTRODUODENOSCOPY (EGD) WITH PROPOFOL;  Surgeon: Juanita Craver, MD;  [facility] ENDOSCOPY;  Service:  Endoscopy;  Laterality: N/A;  . EYE SURGERY  11/01/2012   BOTH EYES CATARACTS  . INSERT / REPLACE / REMOVE PACEMAKER  10/06/10   MDT  implanted by Dr [name]  . KNEE ARTHROSCOPY     both  . LEFT HEART CATH AND CORONARY ANGIOGRAPHY N/A 03/24/2018   Procedure: LEFT HEART CATH AND CORONARY ANGIOGRAPHY;  Surgeon: Lorretta Harp, MD;  Location: [place] CV LAB;  Service: Cardiovascular;  Laterality: N/A;  . OVARY SURGERY     removal  . REPAIR RECTOCELE    . SIMPLE MASTECTOMY WITH AXILLARY SENTINEL NODE BIOPSY Left 01/09/2015   Procedure: Irrigation and Drainage Abcess left Axilla;  Surgeon: Jackolyn Confer, MD;  Location: [facility] ORS;  Service: General;  Laterality: Left;  . TONSILLECTOMY  AGE 88  . TOTAL ABDOMINAL HYSTERECTOMY  1971     Current Meds  Medication Sig  . albuterol (PROAIR HFA) 108 (90 BASE) MCG/ACT inhaler Inhale 2 puffs into the lungs every 4 (four) hours as needed for wheezing.   Marland Kitchen apixaban (ELIQUIS) 5 MG TABS tablet Take 1 tablet (5 mg total) by mouth 2 (two) times daily.  Marland Kitchen atorvastatin (LIPITOR) 40 MG tablet Take 40 mg by mouth daily.   . budesonide-formoterol (SYMBICORT) 160-4.5 MCG/ACT inhaler Inhale 2 puffs into the lungs 2 (two) times daily.  Marland Kitchen diltiazem (CARDIZEM CD) 120 MG 24 hr capsule Take 1 capsule (120 mg total) by mouth daily.  . famotidine (PEPCID) 20 MG tablet Take 1 tablet by mouth 2 (two) times daily.  . fenofibrate 160 MG tablet Take 160 mg by mouth every evening.  . furosemide (LASIX) 20 MG tablet Take 10 mg by mouth. Take 1/2 tab (10 mg ) daily  . ipratropium-albuterol (DUONEB) 0.5-2.5 (3) MG/3ML SOLN Take 3 mLs by nebulization 4 (four) times daily.  . isosorbide mononitrate (IMDUR) 30 MG 24 hr tablet Take 1 tablet (30 mg total) by mouth 2 (two) times daily.  Marland Kitchen LORazepam (ATIVAN) 0.5 MG tablet Take 0.25-0.5 mg by mouth as needed for anxiety.   . metFORMIN (GLUCOPHAGE) 500 MG tablet Take 500 mg by mouth 2 (two) times daily with a meal.  . metoprolol  succinate (TOPROL-XL) 100 MG 24 hr tablet Take 1 tablet by mouth once daily  . nitroGLYCERIN (NITROSTAT) 0.4 MG SL tablet DISSOLVE ONE TABLET UNDER THE TONGUE EVERY 5 MINUTES AS NEEDED FOR CHEST PAIN.  DO NOT EXCEED A TOTAL OF 3 DOSES IN 15 MINUTES (Patient taking differently: Place 0.4 mg under the tongue every 5 (five) minutes as needed for chest pain. )  . Probiotic Product (ALIGN PO) Take 1 capsule by mouth daily.   Marland Kitchen spironolactone (ALDACTONE) 25 MG tablet Take 1 tablet (25 mg total) by mouth daily as needed (swelling).     Allergies:   Bee venom and Latex   Social History   Tobacco Use  . Smoking status: Former Smoker    Packs/day: 0.00    Years: 54.00    Pack years: 0.00    Types: Cigarettes    Quit date: 09/22/2017    Years since quitting: 2.1  . Smokeless  tobacco: Former Systems developer    Quit date: 11/17/2011  Substance Use Topics  . Alcohol use: Not Currently    Alcohol/week: 1.0 standard drinks    Types: 1 Glasses of wine per week    Comment: couple glasses of wine occasionally-once a month  . Drug use: No     Family Hx: The patient's family history includes Congestive Heart Failure in her father and mother; Diabetes in her brother; Osteoarthritis in her sister; Prostate cancer in her brother. There is no history of Anesthesia problems, Hypotension, Malignant hyperthermia, or Pseudochol deficiency.  ROS:   Please see the history of present illness.  All other systems reviewed and are negative.   Prior CV studies:   The following studies were reviewed today:  Echocardiogram 07/19/2018: Study Conclusions  - Left ventricle: The cavity size was normal. Wall thickness was normal. Systolic function was normal. The estimated ejection fraction was in the range of 55% to 60%. Wall motion was normal; there were no regional wall motion abnormalities. Indeterminate diastolic function. - Aortic valve: Mildly to moderately calcified annulus. Trileaflet. - Mitral valve: Mildly  thickened leaflets. There was mild regurgitation. - Right ventricle: Pacer wire or catheter noted in right ventricle. - Right atrium: Central venous pressure (est): 3 mm Hg. - Atrial septum: No defect or patent foramen ovale was identified. - Tricuspid valve: There was trivial regurgitation. - Pulmonary arteries: Systolic pressure could not be accurately estimated. - Pericardium, extracardiac: There was no pericardial effusion.  Labs/Other Tests and Data Reviewed:    EKG:  An ECG dated 09/01/2019 was personally reviewed today and demonstrated:  Dual-chamber pacing with intermittent ventricular pacing.  Recent Labs: 08/29/2019: B Natriuretic Peptide 298.0; Hemoglobin 13.2; Platelets 419 08/30/2019: BUN 43; Creatinine, Ser 1.71; Potassium 4.5; Sodium 138   Recent Lipid Panel Lab Results  Component Value Date/Time   CHOL 125 03/24/2018 05:08 AM   TRIG 194 (H) 03/24/2018 05:08 AM   HDL 35 (L) 03/24/2018 05:08 AM   CHOLHDL 3.6 03/24/2018 05:08 AM   LDLCALC 51 03/24/2018 05:08 AM    Wt Readings from Last 3 Encounters:  11/24/19 234 lb 9.6 oz (106.4 kg)  10/18/19 231 lb 12.8 oz (105.1 kg)  09/29/19 228 lb (103.4 kg)     Objective:    Vital Signs:  BP (!) 151/96   Pulse 84   Ht 5\' 6"  (1.676 m)   Wt 234 lb 9.6 oz (106.4 kg)   BMI 37.87 kg/m    Patient spoke in full sentences, not short of breath. No audible wheezing or coughing. Speech pattern normal.  ASSESSMENT & PLAN:    1.  Persistent atrial fibrillation.  Plan is medical therapy aimed at control of palpitations as well as anticoagulation for stroke prophylaxis.  She is tolerating current regimen including Cardizem CD and Toprol-XL.  Pending device interrogation will give [place] a better sense for rhythm control.  Requesting interval lab work from PCP.  She denies any bleeding episodes.  2.  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  She continues to follow with Dr. [name].  3.  COPD with chronic  hypoxic respiratory failure.  She remains on supplemental oxygen and does not report any progressive shortness of breath.  No interval hospitalizations.  4.  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  No reported angina symptoms.  Continue medical therapy which includes Lipitor and Imdur.  She is not on aspirin given concurrent use of Eliquis.  COVID-19 Education: The signs and symptoms of COVID-19  were discussed with the patient and how to seek care for testing (follow up with PCP or arrange E-visit).  The importance of social distancing was discussed today.  Time:   Today, I have spent 8 minutes with the patient with telehealth technology discussing the above problems.     Medication Adjustments/Labs and Tests Ordered: Current medicines are reviewed at length with the patient today.  Concerns regarding medicines are outlined above.   Tests Ordered: No orders of the defined types were placed in this encounter.   Medication Changes: No orders of the defined types were placed in this encounter.   Follow Up:  In Person 4 months in the [place] office.  Signed, Rozann Lesches, MD  11/24/2019 10:00 AM    [place]

## 2019-11-24 NOTE — Patient Instructions (Addendum)
Medication Instructions:   Your physician recommends that you continue on your current medications as directed. Please refer to the Current Medication list given to you today.  Labwork:  NONE  Testing/Procedures:  NONE  Follow-Up:  Your physician recommends that you schedule a follow-up appointment in: 4 months ([place] office)  Any Other Special Instructions Will Be Listed Below (If Applicable).  If you need a refill on your cardiac medications before your next appointment, please call your pharmacy.

## 2019-12-08 ENCOUNTER — Telehealth: Payer: Self-pay

## 2019-12-08 ENCOUNTER — Ambulatory Visit (INDEPENDENT_AMBULATORY_CARE_PROVIDER_SITE_OTHER): Payer: Medicare HMO | Admitting: *Deleted

## 2019-12-08 DIAGNOSIS — I480 Paroxysmal atrial fibrillation: Secondary | ICD-10-CM | POA: Diagnosis not present

## 2019-12-08 LAB — CUP PACEART REMOTE DEVICE CHECK
Battery Impedance: 1497 Ohm
Battery Remaining Longevity: 35 mo
Battery Voltage: 2.75 V
Brady Statistic AP VP Percent: 80 %
Brady Statistic AP VS Percent: 2 %
Brady Statistic AS VP Percent: 7 %
Brady Statistic AS VS Percent: 11 %
Date Time Interrogation Session: 20210122105707
Implantable Lead Implant Date: 20111121
Implantable Lead Implant Date: 20111121
Implantable Lead Location: 753859
Implantable Lead Location: 753860
Implantable Lead Model: 4469
Implantable Lead Model: 4470
Implantable Lead Serial Number: 548226
Implantable Lead Serial Number: 687643
Implantable Pulse Generator Implant Date: 20111121
Lead Channel Impedance Value: 433 Ohm
Lead Channel Impedance Value: 476 Ohm
Lead Channel Pacing Threshold Amplitude: 0.625 V
Lead Channel Pacing Threshold Amplitude: 0.75 V
Lead Channel Pacing Threshold Pulse Width: 0.4 ms
Lead Channel Pacing Threshold Pulse Width: 0.4 ms
Lead Channel Setting Pacing Amplitude: 2.5 V
Lead Channel Setting Pacing Amplitude: 2.75 V
Lead Channel Setting Pacing Pulse Width: 0.4 ms
Lead Channel Setting Sensing Sensitivity: 5.6 mV

## 2019-12-08 NOTE — Telephone Encounter (Signed)
Spoke with patient to remind of missed remote transmission 

## 2019-12-22 ENCOUNTER — Other Ambulatory Visit: Payer: Self-pay

## 2019-12-22 ENCOUNTER — Ambulatory Visit: Payer: Medicare HMO | Attending: Internal Medicine

## 2019-12-22 DIAGNOSIS — Z20822 Contact with and (suspected) exposure to covid-19: Secondary | ICD-10-CM

## 2019-12-24 LAB — NOVEL CORONAVIRUS, NAA: SARS-CoV-2, NAA: NOT DETECTED

## 2019-12-26 ENCOUNTER — Emergency Department (HOSPITAL_COMMUNITY): Payer: Medicare HMO

## 2019-12-26 ENCOUNTER — Emergency Department (HOSPITAL_COMMUNITY)
Admission: EM | Admit: 2019-12-26 | Discharge: 2019-12-26 | Disposition: A | Discharge status: Home / Self Care | Payer: Medicare HMO | Attending: Emergency Medicine | Admitting: Emergency Medicine

## 2019-12-26 ENCOUNTER — Other Ambulatory Visit: Payer: Self-pay

## 2019-12-26 ENCOUNTER — Encounter (HOSPITAL_COMMUNITY): Payer: Self-pay | Admitting: Emergency Medicine

## 2019-12-26 DIAGNOSIS — I110 Hypertensive heart disease with heart failure: Secondary | ICD-10-CM | POA: Diagnosis not present

## 2019-12-26 DIAGNOSIS — Z7984 Long term (current) use of oral hypoglycemic drugs: Secondary | ICD-10-CM | POA: Diagnosis not present

## 2019-12-26 DIAGNOSIS — R0602 Shortness of breath: Secondary | ICD-10-CM

## 2019-12-26 DIAGNOSIS — Z87891 Personal history of nicotine dependence: Secondary | ICD-10-CM | POA: Insufficient documentation

## 2019-12-26 DIAGNOSIS — J449 Chronic obstructive pulmonary disease, unspecified: Secondary | ICD-10-CM | POA: Diagnosis not present

## 2019-12-26 DIAGNOSIS — I5032 Chronic diastolic (congestive) heart failure: Secondary | ICD-10-CM | POA: Diagnosis not present

## 2019-12-26 DIAGNOSIS — J189 Pneumonia, unspecified organism: Secondary | ICD-10-CM

## 2019-12-26 DIAGNOSIS — E119 Type 2 diabetes mellitus without complications: Secondary | ICD-10-CM | POA: Insufficient documentation

## 2019-12-26 DIAGNOSIS — Z91040 Latex allergy status: Secondary | ICD-10-CM | POA: Insufficient documentation

## 2019-12-26 DIAGNOSIS — Z950 Presence of cardiac pacemaker: Secondary | ICD-10-CM | POA: Diagnosis not present

## 2019-12-26 DIAGNOSIS — I2510 Atherosclerotic heart disease of native coronary artery without angina pectoris: Secondary | ICD-10-CM | POA: Insufficient documentation

## 2019-12-26 DIAGNOSIS — Z79899 Other long term (current) drug therapy: Secondary | ICD-10-CM | POA: Diagnosis not present

## 2019-12-26 LAB — CBC
HCT: 31.5 % — ABNORMAL LOW (ref 36.0–46.0)
Hemoglobin: 9.3 g/dL — ABNORMAL LOW (ref 12.0–15.0)
MCH: 27.8 pg (ref 26.0–34.0)
MCHC: 29.5 g/dL — ABNORMAL LOW (ref 30.0–36.0)
MCV: 94 fL (ref 80.0–100.0)
Platelets: 331 10*3/uL (ref 150–400)
RBC: 3.35 MIL/uL — ABNORMAL LOW (ref 3.87–5.11)
RDW: 14.7 % (ref 11.5–15.5)
WBC: 6.2 10*3/uL (ref 4.0–10.5)
nRBC: 0 % (ref 0.0–0.2)

## 2019-12-26 LAB — BASIC METABOLIC PANEL
Anion gap: 12 (ref 5–15)
BUN: 23 mg/dL (ref 8–23)
CO2: 27 mmol/L (ref 22–32)
Calcium: 9.1 mg/dL (ref 8.9–10.3)
Chloride: 102 mmol/L (ref 98–111)
Creatinine, Ser: 1.14 mg/dL — ABNORMAL HIGH (ref 0.44–1.00)
GFR calc Af Amer: 54 mL/min — ABNORMAL LOW (ref >60)
GFR calc non Af Amer: 47 mL/min — ABNORMAL LOW (ref >60)
Glucose, Bld: 164 mg/dL — ABNORMAL HIGH (ref 70–99)
Potassium: 3.9 mmol/L (ref 3.5–5.1)
Sodium: 141 mmol/L (ref 135–145)

## 2019-12-26 MED ORDER — ALBUTEROL SULFATE HFA 108 (90 BASE) MCG/ACT IN AERS
2.0000 | INHALATION_SPRAY | Freq: Once | RESPIRATORY_TRACT | Status: AC
  Administered 2019-12-26: 2 via RESPIRATORY_TRACT
  Filled 2019-12-26: qty 6.7

## 2019-12-26 MED ORDER — AZITHROMYCIN 250 MG PO TABS
500.0000 mg | ORAL_TABLET | Freq: Once | ORAL | Status: AC
  Administered 2019-12-26: 19:00:00 500 mg via ORAL
  Filled 2019-12-26: qty 2

## 2019-12-26 MED ORDER — AZITHROMYCIN 250 MG PO TABS: ORAL_TABLET | ORAL | 0 refills | Status: DC

## 2019-12-26 MED ORDER — SODIUM CHLORIDE 0.9 % IV SOLN
2.0000 g | Freq: Once | INTRAVENOUS | Status: AC
  Administered 2019-12-26: 20:00:00 2 g via INTRAVENOUS
  Filled 2019-12-26: qty 20

## 2019-12-26 NOTE — ED Notes (Signed)
Pt home o2 tank ran out of oxygen. Pt given ED oxygen tank to use while waiting for room. Lab drawing blood sample at this time.

## 2019-12-26 NOTE — ED Triage Notes (Signed)
Pt reports shortness of breath, cough, chest tightness since Friday. Pt reports was tested for covid on 12/21/18 with a negative result.

## 2019-12-26 NOTE — Discharge Instructions (Addendum)
Your x-ray today shows that you have pneumonia.  Is important that you take the antibiotic as directed until it is finished.  You may continue to use the albuterol inhaler, 1 to 2 puffs every 4-6 hours as needed.  You have been scheduled to return here tomorrow to have an ultrasound of your right leg.  Please call the radiology scheduling department at [phone] to arrange an appointment time for your ultrasound.  Follow-up with your primary provider later this week for recheck.  Return to the emergency department if you develop any worsening symptoms or increasing shortness of breath.

## 2019-12-26 NOTE — ED Provider Notes (Signed)
[facility] EMERGENCY DEPARTMENT Provider Note   CSN: [id number] Arrival date & time: 12/26/19  1404     History Chief Complaint  Patient presents with  . Shortness of Breath    Brandi Hunter is a 79 y.o. female.  HPI      Brandi Hunter is a 79 y.o. female with past medical history of diabetes, asthma, coronary artery disease, COPD, paroxysmal atrial fib and anticoagulated with Eliquis, and has a cardiac pacemaker who presents to the Emergency Department complaining of increasing shortness of breath.  She is chronically on 2 L of oxygen by nasal cannula.  She states despite her oxygen she has been having increasing shortness of breath for several days.  She had a negative Covid test on 12/22/2019.  She also reports having a nonproductive cough and pain to her left shoulder blade area.  She denies chest pain, fever, chills, abdominal pain,  nausea or vomiting.    Past Medical History:  Diagnosis Date  . Asthma   . CAD (coronary artery disease)    a. catheterization in 03/2018 showing 90% ostial OM stenosis with medical management recommended at that time  . Chronic anticoagulation   . COPD (chronic obstructive pulmonary disease) ([place])   . DJD (degenerative joint disease)   . Essential hypertension   . GERD (gastroesophageal reflux disease)   . History of home oxygen therapy   . Hyperlipidemia   . Mobitz (type) II atrioventricular block   . Obesity   . Pacemaker    Implanted by Dr [name] (MDT) 10/06/10  . PAF (paroxysmal atrial fibrillation) ([place])   . Pancreatitis 2010 OR 2011  . Sleep apnea    CPAP  . Type 2 diabetes mellitus [facility])     Patient Active Problem List   Diagnosis Date Noted  . Acute and chronic respiratory failure (acute-on-chronic) ([place]) 08/30/2019  . COPD exacerbation ([place]) 08/29/2019  . Chest pain 07/18/2018  . Sepsis due to urinary tract infection ([place]) 06/30/2018  . AKI (acute kidney injury) ([place]) 06/30/2018  . Sepsis, unspecified organism ([place])  06/30/2018  . Normocytic anemia 06/30/2018  . Coronary artery disease due to lipid rich plaque   . Unstable angina ([place]) 03/23/2018  . Acute respiratory distress 09/26/2017  . Acute exacerbation of chronic obstructive pulmonary disease (COPD) ([place]) 09/26/2017  . 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 Type 2 diabetes mellitus ([place]) 09/26/2017  . Acute on chronic diastolic CHF (congestive heart failure) ([place]) 09/26/2017  . Cardiac pacemaker 11/17/2016  . Eczema 11/17/2016  . Major depression, recurrent, chronic ([place]) 09/17/2016  . Axillary abscess 07/22/2016  . Chronic left shoulder pain 10/01/2015  . Xerosis cutis 10/01/2015  . Paroxysmal atrial fibrillation ([place]) 05/24/2014  . PAF (paroxysmal atrial fibrillation) ([place]) 05/24/2014  . NAFLD (nonalcoholic fatty liver disease) 03/29/2014  . Renal cyst 03/29/2014  . Elevated TSH 03/06/2014  . Type 2 diabetes mellitus without complication, without long-term current use of insulin ([place]) 03/06/2014  . Peripheral neuropathy 04/27/2013  . Vitamin B12 deficiency 04/27/2013  . Abnormality of gait 03/29/2013  . Diabetic neuropathy ([place]) 03/29/2013  . OSA (obstructive sleep apnea) 08/26/2012  . Chronic asthmatic bronchitis ([place]) 08/26/2012  . Preop cardiovascular exam 08/15/2012  . Nocturnal hypoxia 04/08/2012  . Seborrheic dermatitis 10/21/2011  . IBS (irritable bowel syndrome) 05/27/2011  . Esophageal reflux 03/27/2011  . Mixed hyperlipidemia 03/27/2011  . Chronic obstructive pulmonary disease ([place]) 03/27/2011  . 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  Essential hypertension 02/09/2011  . Obesity 02/09/2011  . Tobacco use disorder 02/05/2011  . Vitamin D deficiency 12/30/2010  . Osteoarthrosis involving lower leg 12/26/2010  . Allergic rhinitis 01/23/2009  . Mixed incontinence 10/08/2008    Past Surgical History:  Procedure Laterality Date  . CARDIOVERSION N/A 08/18/2019     Procedure: CARDIOVERSION;  Surgeon: Lelon Perla, MD;  Location: [facility] ENDOSCOPY;  Service: Cardiovascular;  Laterality: N/A;  . CATARACT EXTRACTION W/PHACO  11/02/2011   Procedure: CATARACT EXTRACTION PHACO AND INTRAOCULAR LENS PLACEMENT ([place]);  Surgeon: Williams Che;  Location: [facility] ORS;  Service: Ophthalmology;  Laterality: Right;  CDE=7.33  . CATARACT EXTRACTION W/PHACO  12/07/2011   Procedure: CATARACT EXTRACTION PHACO AND INTRAOCULAR LENS PLACEMENT (IOC);  Surgeon: Williams Che, MD;  Location: [facility] ORS;  Service: Ophthalmology;  Laterality: Left;  CDE 3.61  . COLONOSCOPY WITH PROPOFOL N/A 08/09/2014   Procedure: COLONOSCOPY WITH PROPOFOL;  Surgeon: Juanita Craver, MD;  Location: [facility] ENDOSCOPY;  Service: Endoscopy;  Laterality: N/A;  . CYSTOCELE REPAIR    . ESOPHAGOGASTRODUODENOSCOPY (EGD) WITH PROPOFOL N/A 08/09/2014   Procedure: ESOPHAGOGASTRODUODENOSCOPY (EGD) WITH PROPOFOL;  Surgeon: Juanita Craver, MD;  Location: [facility] ENDOSCOPY;  Service: Endoscopy;  Laterality: N/A;  . EYE SURGERY  11/01/2012   BOTH EYES CATARACTS  . INSERT / REPLACE / REMOVE PACEMAKER  10/06/10   MDT  implanted by Dr [name]  . KNEE ARTHROSCOPY     both  . LEFT HEART CATH AND CORONARY ANGIOGRAPHY N/A 03/24/2018   Procedure: LEFT HEART CATH AND CORONARY ANGIOGRAPHY;  Surgeon: Lorretta Harp, MD;  Location: [facility] CV LAB;  Service: Cardiovascular;  Laterality: N/A;  . OVARY SURGERY     removal  . REPAIR RECTOCELE    . SIMPLE MASTECTOMY WITH AXILLARY SENTINEL NODE BIOPSY Left 01/09/2015   Procedure: Irrigation and Drainage Abcess left Axilla;  Surgeon: Jackolyn Confer, MD;  Location: [facility] ORS;  Service: General;  Laterality: Left;  . TONSILLECTOMY  AGE 76  . TOTAL ABDOMINAL HYSTERECTOMY  1971     OB History    Gravida  3   Para  3   Term  3   Preterm      AB      Living  3     SAB      TAB      Ectopic      Multiple      Live Births              Family History  Problem Relation Age of  Onset  . Congestive Heart Failure Mother   . Congestive Heart Failure Father   . Osteoarthritis Sister   . Prostate cancer Brother   . Diabetes Brother   . Anesthesia problems Neg Hx   . Hypotension Neg Hx   . Malignant hyperthermia Neg Hx   . Pseudochol deficiency Neg Hx     Social History   Tobacco Use  . Smoking status: Former Smoker    Packs/day: 0.00    Years: 54.00    Pack years: 0.00    Types: Cigarettes    Quit date: 09/22/2017    Years since quitting: 2.2  . Smokeless tobacco: Former Systems developer    Quit date: 11/17/2011  Substance Use Topics  . Alcohol use: Not Currently    Alcohol/week: 1.0 standard drinks    Types: 1 Glasses of wine per week    Comment: couple glasses of wine occasionally-once a month  . Drug use: No  Home Medications Prior to Admission medications   Medication Sig Start Date End Date Taking? Authorizing Provider  albuterol (PROAIR HFA) 108 (90 BASE) MCG/ACT inhaler Inhale 2 puffs into the lungs every 4 (four) hours as needed for wheezing.  07/21/12   [provider]  apixaban (ELIQUIS) 5 MG TABS tablet Take 1 tablet (5 mg total) by mouth 2 (two) times daily. 07/04/19   Satira Sark, MD  atorvastatin (LIPITOR) 40 MG tablet Take 40 mg by mouth daily.  03/17/13   [provider]  budesonide-formoterol (SYMBICORT) 160-4.5 MCG/ACT inhaler Inhale 2 puffs into the lungs 2 (two) times daily.    [provider]  diltiazem (CARDIZEM CD) 120 MG 24 hr capsule Take 1 capsule (120 mg total) by mouth daily. 09/29/19   Satira Sark, MD  famotidine (PEPCID) 20 MG tablet Take 1 tablet by mouth 2 (two) times daily. 09/06/19   [provider]  fenofibrate 160 MG tablet Take 160 mg by mouth every evening.    [provider]  furosemide (LASIX) 20 MG tablet Take 10 mg by mouth. Take 1/2 tab (10 mg ) daily    [provider]  ipratropium-albuterol (DUONEB) 0.5-2.5 (3) MG/3ML SOLN Take 3 mLs by nebulization 4 (four)  times daily. 08/31/19   Johnson, Clanford L, MD  isosorbide mononitrate (IMDUR) 30 MG 24 hr tablet Take 1 tablet (30 mg total) by mouth 2 (two) times daily. 08/03/19 08/02/20  Satira Sark, MD  LORazepam (ATIVAN) 0.5 MG tablet Take 0.25-0.5 mg by mouth as needed for anxiety.  04/28/13   [provider]  metFORMIN (GLUCOPHAGE) 500 MG tablet Take 500 mg by mouth 2 (two) times daily with a meal.    [provider]  metoprolol succinate (TOPROL-XL) 100 MG 24 hr tablet Take 1 tablet by mouth once daily 09/13/19   [name], [name], MD  nitroGLYCERIN (NITROSTAT) 0.4 MG SL tablet DISSOLVE ONE TABLET UNDER THE TONGUE EVERY 5 MINUTES AS NEEDED FOR CHEST PAIN.  DO NOT EXCEED A TOTAL OF 3 DOSES IN 15 MINUTES Patient taking differently: Place 0.4 mg under the tongue every 5 (five) minutes as needed for chest pain.  07/04/19   Satira Sark, MD  Probiotic Product (ALIGN PO) Take 1 capsule by mouth daily.     [provider]  spironolactone (ALDACTONE) 25 MG tablet Take 1 tablet (25 mg total) by mouth daily as needed (swelling). 08/31/19   Murlean Iba, MD    Allergies    Bee venom and Latex  Review of Systems   Review of Systems  Constitutional: Negative for appetite change, chills and fever.  HENT: Negative for congestion, sore throat and trouble swallowing.   Respiratory: Positive for cough and shortness of breath. Negative for chest tightness and wheezing.   Cardiovascular: Positive for leg swelling (Right lower leg). Negative for chest pain.  Gastrointestinal: Negative for abdominal pain, diarrhea, nausea and vomiting.  Genitourinary: Negative for dysuria and flank pain.  Musculoskeletal: Negative for arthralgias, neck pain and neck stiffness.  Skin: Negative for rash.  Neurological: Negative for dizziness, weakness, numbness and headaches.  Hematological: Negative for adenopathy.    Physical Exam Updated Vital Signs BP (!) 152/91   Pulse 86   Temp 98.3 F  (36.8 C) (Oral)   Resp (!) 22   Ht 5\' 6"  (1.676 m)   Wt 106.6 kg   SpO2 96%   BMI 37.93 kg/m   Physical Exam Vitals and nursing note reviewed.  Constitutional:      General: She is not in acute distress.    Appearance: She is well-developed. She is not toxic-appearing.  HENT:     Right Ear: Tympanic membrane and ear canal normal.     Left Ear: Tympanic membrane and ear canal normal.     Mouth/Throat:     Mouth: Mucous membranes are moist.     Pharynx: Uvula midline. No oropharyngeal exudate.  Neck:     Trachea: Phonation normal.  Cardiovascular:     Rate and Rhythm: Normal rate and regular rhythm.     Pulses: Normal pulses.  Pulmonary:     Effort: Pulmonary effort is normal. No respiratory distress.     Breath sounds: No stridor. No rales.  Chest:     Chest wall: No tenderness.  Abdominal:     General: There is no distension.     Palpations: Abdomen is soft.     Tenderness: There is no abdominal tenderness.  Musculoskeletal:     Cervical back: Full passive range of motion without pain, normal range of motion and neck supple.     Right lower leg: Edema present.     Comments: Minimal bilateral pitting edema with right greater than left.   mild tenderness to the right posterior lower leg.  No excessive warmth or erythema noted.  Dorsalis pedis and posterior tibial pulses are palpable bilaterally.  Lymphadenopathy:     Cervical: No cervical adenopathy.  Skin:    General: Skin is warm.     Capillary Refill: Capillary refill takes less than 2 seconds.     Findings: No erythema or rash.  Neurological:     General: No focal deficit present.     Mental Status: She is alert.     Motor: No abnormal muscle tone.     Coordination: Coordination normal.     ED Results / Procedures / Treatments   Labs (all labs ordered are listed, but only abnormal results are displayed) Labs Reviewed  BASIC METABOLIC PANEL - Abnormal; Notable for the following components:      Result Value     Glucose, Bld 164 (*)    Creatinine, Ser 1.14 (*)    GFR calc non Af Amer 47 (*)    GFR calc Af Amer 54 (*)    All other components within normal limits  CBC - Abnormal; Notable for the following components:   RBC 3.35 (*)    Hemoglobin 9.3 (*)    HCT 31.5 (*)    MCHC 29.5 (*)    All other components within normal limits    EKG EKG Interpretation  Date/Time:  Tuesday December 26 2019 14:13:45 EST Ventricular Rate:  84 PR Interval:    QRS Duration: 150 QT Interval:  426 QTC Calculation: 503 R Axis:   -78 Text Interpretation: Ventricular-paced rhythm Abnormal ECG Confirmed by Veryl Speak [phone]) on 12/26/2019 2:38:46 PM   Radiology DG Chest Port 1 View  Result Date: 12/26/2019 CLINICAL DATA:  Short of breath, cough, chest tightness EXAM: PORTABLE CHEST 1 VIEW COMPARISON:  08/29/2019 FINDINGS: Single frontal view of the chest demonstrates stable dual lead pacemaker. The cardiac silhouette is unchanged. There are patchy areas of consolidation seen at the lung bases, left greater than right. Small bilateral pleural effusions. No pneumothorax. IMPRESSION: 1. Patchy bibasilar consolidation, greatest on the left, which could reflect bronchopneumonia. 2. Trace bilateral pleural effusions left greater than right. Electronically Signed   By: Randa Ngo M.D.   On: 12/26/2019  18:50    Procedures Procedures (including critical care time)  Medications Ordered in ED Medications  albuterol (VENTOLIN HFA) 108 (90 Base) MCG/ACT inhaler 2 puff (2 puffs Inhalation Given 12/26/19 1927)  cefTRIAXone (ROCEPHIN) 2 g in sodium chloride 0.9 % 100 mL IVPB (2 g Intravenous New Bag/Given 12/26/19 1932)  azithromycin (ZITHROMAX) tablet 500 mg (500 mg Oral Given 12/26/19 1926)    ED Course  I have reviewed the triage vital signs and the nursing notes.  Pertinent labs & imaging results that were available during my care of the patient were reviewed by me and considered in my medical decision making (see  chart for details).    MDM Rules/Calculators/A&P                       Patient reporting shortness of breath despite chronic oxygen requirement of 2 L by nasal cannula.  She is well-appearing and nontoxic.  No hypoxia here.  She has not required additional oxygen support.  Vital signs are reassuring.  No concerning symptoms for sepsis.  No chest pain.  Chest x-ray does show likely pneumonia.  Patient does have right lower leg edema with mild tenderness.  This is concerning for DVT although patient is anticoagulated on Eliquis.  Pt also seen by Dr. Lacinda Axon and care plan discussed.    Patient is felt to be stable for discharge home, since ultrasound is unavailable after hours, I will schedule patient to return here tomorrow for DVT study of her right lower leg.  She agrees to this plan and she has been given IV antibiotics here and prescription written for azithromycin.  Strict return precautions were discussed   Final Clinical Impression(s) / ED Diagnoses Final diagnoses:  Community acquired pneumonia, unspecified laterality    Rx / DC Orders ED Discharge Orders    None       Bufford Lope 12/26/19 2136    Nat Christen, MD 12/26/19 2315

## 2019-12-27 ENCOUNTER — Ambulatory Visit (HOSPITAL_COMMUNITY)
Admission: RE | Admit: 2019-12-27 | Discharge: 2019-12-27 | Disposition: A | Discharge status: Home / Self Care | Payer: Medicare HMO | Source: Ambulatory Visit | Attending: Emergency Medicine | Admitting: Emergency Medicine

## 2019-12-27 DIAGNOSIS — M7989 Other specified soft tissue disorders: Secondary | ICD-10-CM | POA: Diagnosis present

## 2019-12-27 DIAGNOSIS — M7121 Synovial cyst of popliteal space [Baker], right knee: Secondary | ICD-10-CM | POA: Diagnosis not present

## 2020-02-20 ENCOUNTER — Ambulatory Visit (HOSPITAL_COMMUNITY): Payer: Medicare HMO | Admitting: Physician Assistant

## 2020-02-20 ENCOUNTER — Encounter (HOSPITAL_COMMUNITY): Payer: Self-pay | Admitting: Physician Assistant

## 2020-02-20 ENCOUNTER — Other Ambulatory Visit: Payer: Self-pay

## 2020-02-20 ENCOUNTER — Ambulatory Visit (HOSPITAL_COMMUNITY)
Admission: RE | Admit: 2020-02-20 | Discharge: 2020-02-20 | Disposition: A | Discharge status: Home / Self Care | Payer: Medicare HMO | Source: Ambulatory Visit | Attending: Physician Assistant | Admitting: Physician Assistant

## 2020-02-20 VITALS — BP 144/82 | HR 78 | Ht 66.0 in | Wt 235.0 lb

## 2020-02-20 DIAGNOSIS — Z8249 Family history of ischemic heart disease and other diseases of the circulatory system: Secondary | ICD-10-CM | POA: Diagnosis not present

## 2020-02-20 DIAGNOSIS — G4733 Obstructive sleep apnea (adult) (pediatric): Secondary | ICD-10-CM | POA: Diagnosis not present

## 2020-02-20 DIAGNOSIS — Z833 Family history of diabetes mellitus: Secondary | ICD-10-CM | POA: Diagnosis not present

## 2020-02-20 DIAGNOSIS — I2510 Atherosclerotic heart disease of native coronary artery without angina pectoris: Secondary | ICD-10-CM | POA: Diagnosis not present

## 2020-02-20 DIAGNOSIS — Z79899 Other long term (current) drug therapy: Secondary | ICD-10-CM | POA: Diagnosis not present

## 2020-02-20 DIAGNOSIS — Z7984 Long term (current) use of oral hypoglycemic drugs: Secondary | ICD-10-CM | POA: Insufficient documentation

## 2020-02-20 DIAGNOSIS — I10 Essential (primary) hypertension: Secondary | ICD-10-CM | POA: Insufficient documentation

## 2020-02-20 DIAGNOSIS — J449 Chronic obstructive pulmonary disease, unspecified: Secondary | ICD-10-CM | POA: Insufficient documentation

## 2020-02-20 DIAGNOSIS — Z950 Presence of cardiac pacemaker: Secondary | ICD-10-CM | POA: Diagnosis not present

## 2020-02-20 DIAGNOSIS — D6869 Other thrombophilia: Secondary | ICD-10-CM

## 2020-02-20 DIAGNOSIS — Z6837 Body mass index (BMI) 37.0-37.9, adult: Secondary | ICD-10-CM | POA: Insufficient documentation

## 2020-02-20 DIAGNOSIS — E669 Obesity, unspecified: Secondary | ICD-10-CM | POA: Insufficient documentation

## 2020-02-20 DIAGNOSIS — E785 Hyperlipidemia, unspecified: Secondary | ICD-10-CM | POA: Insufficient documentation

## 2020-02-20 DIAGNOSIS — I441 Atrioventricular block, second degree: Secondary | ICD-10-CM | POA: Insufficient documentation

## 2020-02-20 DIAGNOSIS — E119 Type 2 diabetes mellitus without complications: Secondary | ICD-10-CM | POA: Insufficient documentation

## 2020-02-20 DIAGNOSIS — Z87891 Personal history of nicotine dependence: Secondary | ICD-10-CM | POA: Diagnosis not present

## 2020-02-20 DIAGNOSIS — Z7901 Long term (current) use of anticoagulants: Secondary | ICD-10-CM | POA: Diagnosis not present

## 2020-02-20 DIAGNOSIS — I4819 Other persistent atrial fibrillation: Secondary | ICD-10-CM

## 2020-02-20 NOTE — Progress Notes (Signed)
Primary Care Physician: [name], [place] Primary Cardiologist: Dr Domenic Polite Primary Electrophysiologist: Dr [name]  Referring Physician: Dr Trevor Iha Brandi Hunter is a 79 y.o. 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 Patient had previously had a very low AF burden on device interrogation. However, she was noted to be persistently in afib for over 6 months on device interrogation. Patient notes a gradual onset of fatigue during that time. She underwent DCCV on 08/18/19 x3 but quickly reverted to afib each time. She was admitted on 10/13-10/15/20 with an acute COPD exacerbation.   On follow up today, patient reports doing reasonably well. She cannot tell when she is in or out of afib. She was seen in the ER on 12/26/19 with SOB and was diagnosed with pneumonia. She is tolerating the medication without difficulty.   Today, she denies symptoms of palpitations, chest pain, orthopnea, PND, dizziness, presyncope, syncope, snoring, daytime somnolence, bleeding, or neurologic sequela. The patient is tolerating medications without difficulties and is otherwise without complaint today.    Atrial Fibrillation Risk Factors:  she does have symptoms or diagnosis of sleep apnea. she is compliant with CPAP therapy.   she has a BMI of Body mass index is 37.93 kg/m.Marland Kitchen Filed Weights   02/20/20 1118  Weight: 106.6 kg    Family History  Problem Relation Age of Onset  . Congestive Heart Failure Mother   . Congestive Heart Failure Father   . Osteoarthritis Sister   . Prostate cancer Brother   . Diabetes Brother   . Anesthesia problems Neg Hx   . Hypotension Neg Hx   . Malignant hyperthermia Neg Hx   . Pseudochol deficiency Neg Hx      Atrial Fibrillation Management history:  Previous antiarrhythmic drugs: none Previous cardioversions:  08/18/19 Previous ablations: none CHADS2VASC score: 6 Anticoagulation history: Eliquis    Past Medical History:  Diagnosis Date  . Asthma   . CAD (coronary artery disease)    a. catheterization in 03/2018 showing 90% ostial OM stenosis with medical management recommended at that time  . Chronic anticoagulation   . COPD (chronic obstructive pulmonary disease) ([place])   . DJD (degenerative joint disease)   . Essential hypertension   . GERD (gastroesophageal reflux disease)   . History of home oxygen therapy   . Hyperlipidemia   . Mobitz (type) II atrioventricular block   . Obesity   . Pacemaker    Implanted by Dr [name] (MDT) 10/06/10  . PAF (paroxysmal atrial fibrillation) ([place])   . Pancreatitis 2010 OR 2011  . Sleep apnea    CPAP  . Type 2 diabetes mellitus ([place])    Past Surgical History:  Procedure Laterality Date  . CARDIOVERSION N/A 08/18/2019   Procedure: CARDIOVERSION;  Surgeon: Lelon Perla, MD;  Location[facility] ENDOSCOPY;  Service: Cardiovascular;  Laterality: N/A;  . CATARACT EXTRACTION W/PHACO  11/02/2011   Procedure: CATARACT EXTRACTION PHACO AND INTRAOCULAR LENS PLACEMENT ([place]);  Surgeon: Williams Che;  Location: [facility] ORS;  Service: Ophthalmology;  Laterality: Right;  CDE=7.33  . CATARACT EXTRACTION W/PHACO  12/07/2011   Procedure: CATARACT EXTRACTION PHACO AND INTRAOCULAR LENS PLACEMENT (IOC);  Surgeon: Williams Che, MD;  Location: [facility] ORS;  Service: Ophthalmology;  Laterality: Left;  CDE 3.61  . COLONOSCOPY WITH PROPOFOL N/A 08/09/2014   Procedure: COLONOSCOPY WITH PROPOFOL;  Surgeon: Juanita Craver, MD;  Location: [facility] ENDOSCOPY;  Service: Endoscopy;  Laterality: N/A;  . CYSTOCELE REPAIR    . ESOPHAGOGASTRODUODENOSCOPY (EGD) WITH PROPOFOL N/A 08/09/2014   Procedure: ESOPHAGOGASTRODUODENOSCOPY (EGD) WITH PROPOFOL;  Surgeon: Juanita Craver, MD;  [facility] ENDOSCOPY;  Service: Endoscopy;  Laterality: N/A;  . EYE SURGERY  11/01/2012   BOTH EYES CATARACTS  . INSERT /  REPLACE / REMOVE PACEMAKER  10/06/10   MDT  implanted by Dr [name]  . KNEE ARTHROSCOPY     both  . LEFT HEART CATH AND CORONARY ANGIOGRAPHY N/A 03/24/2018   Procedure: LEFT HEART CATH AND CORONARY ANGIOGRAPHY;  Surgeon: Lorretta Harp, MD;  Location: [facility] CV LAB;  Service: Cardiovascular;  Laterality: N/A;  . OVARY SURGERY     removal  . REPAIR RECTOCELE    . SIMPLE MASTECTOMY WITH AXILLARY SENTINEL NODE BIOPSY Left 01/09/2015   Procedure: Irrigation and Drainage Abcess left Axilla;  Surgeon: Jackolyn Confer, MD;  Location: [facility] ORS;  Service: General;  Laterality: Left;  . TONSILLECTOMY  AGE 49  . TOTAL ABDOMINAL HYSTERECTOMY  1971    Current Outpatient Medications  Medication Sig Dispense Refill  . albuterol (PROAIR HFA) 108 (90 BASE) MCG/ACT inhaler Inhale 2 puffs into the lungs every 4 (four) hours as needed for wheezing.     Marland Kitchen apixaban (ELIQUIS) 5 MG TABS tablet Take 1 tablet (5 mg total) by mouth 2 (two) times daily. 180 tablet 3  . atorvastatin (LIPITOR) 40 MG tablet Take 40 mg by mouth at bedtime.     . budesonide-formoterol (SYMBICORT) 160-4.5 MCG/ACT inhaler Inhale 2 puffs into the lungs 2 (two) times daily.    Marland Kitchen diltiazem (CARDIZEM CD) 120 MG 24 hr capsule Take 1 capsule (120 mg total) by mouth daily. (Patient taking differently: Take 120 mg by mouth every morning. ) 90 capsule 3  . famotidine (PEPCID) 20 MG tablet Take 20 mg by mouth 2 (two) times daily.     . fenofibrate 160 MG tablet Take 160 mg by mouth every evening.    . furosemide (LASIX) 20 MG tablet Take 10 mg by mouth daily.     Marland Kitchen ipratropium-albuterol (DUONEB) 0.5-2.5 (3) MG/3ML SOLN Take 3 mLs by nebulization 4 (four) times daily. 360 mL 1  . isosorbide mononitrate (IMDUR) 30 MG 24 hr tablet Take 1 tablet (30 mg total) by mouth 2 (two) times daily. 180 tablet 3  . LORazepam (ATIVAN) 0.5 MG tablet Take 0.25-0.5 mg by mouth 2 (two) times daily as needed for anxiety.     . metFORMIN (GLUCOPHAGE) 500 MG tablet Take  500 mg by mouth 2 (two) times daily with a meal.    . metoprolol succinate (TOPROL-XL) 100 MG 24 hr tablet Take 1 tablet by mouth once daily (Patient taking differently: Take 100 mg by mouth every morning. ) 90 tablet 3  . nitroGLYCERIN (NITROSTAT) 0.4 MG SL tablet DISSOLVE ONE TABLET UNDER THE TONGUE EVERY 5 MINUTES AS NEEDED FOR CHEST PAIN.  DO NOT EXCEED A TOTAL OF 3 DOSES IN 15 MINUTES (Patient taking differently: Place 0.4 mg under the tongue every 5 (five) minutes as needed for chest pain. ) 25 tablet 1  . OXYGEN Inhale 2 L into the lungs continuous.    . Probiotic Product (ALIGN PO) Take 1 capsule by mouth daily.     Marland Kitchen spironolactone (ALDACTONE) 25 MG tablet Take 1 tablet (25 mg total) by mouth daily as needed (swelling).     No current facility-administered medications  for this encounter.    Allergies  Allergen Reactions  . Bee Venom Swelling  . Latex Swelling    LATEX CATHETERS    Social History   Socioeconomic History  . Marital status: Widowed    Spouse name: Not on file  . Number of children: 3  . Years of education: College  . Highest education level: Not on file  Occupational History  . Occupation: Part time Retail buyer: [employer]  Tobacco Use  . Smoking status: Former Smoker    Packs/day: 0.00    Years: 54.00    Pack years: 0.00    Types: Cigarettes    Quit date: 09/22/2017    Years since quitting: 2.4  . Smokeless tobacco: Former Systems developer    Quit date: 11/17/2011  Substance and Sexual Activity  . Alcohol use: Not Currently    Alcohol/week: 1.0 standard drinks    Types: 1 Glasses of wine per week    Comment: couple glasses of wine occasionally-once a month  . Drug use: No  . Sexual activity: Not on file  Other Topics Concern  . Not on file  Social History Narrative   Patient lives at home alone.   Caffeine Use: 16oz drink daily   Social Determinants of Health   Financial Resource Strain:   . Difficulty of Paying Living Expenses:     Food Insecurity:   . Worried About Charity fundraiser in the Last Year:   . Arboriculturist in the Last Year:   Transportation Needs:   . Film/video editor (Medical):   Marland Kitchen Lack of Transportation (Non-Medical):   Physical Activity:   . Days of Exercise per Week:   . Minutes of Exercise per Session:   Stress:   . Feeling of Stress :   Social Connections:   . Frequency of Communication with Friends and Family:   . Frequency of Social Gatherings with Friends and Family:   . Attends Religious Services:   . Active Member of Clubs or Organizations:   . Attends Archivist Meetings:   Marland Kitchen Marital Status:   Intimate Partner Violence:   . Fear of Current or Ex-Partner:   . Emotionally Abused:   Marland Kitchen Physically Abused:   . Sexually Abused:      ROS- All systems are reviewed and negative except as per the HPI above.  Physical Exam: Vitals:   02/20/20 1118  BP: (!) 144/82  Pulse: 78  Weight: 106.6 kg  Height: 5\' 6"  (1.676 m)    GEN- The patient is well appearing obese elderly female, alert and oriented x 3 today.   HEENT-head normocephalic, atraumatic, sclera clear, conjunctiva pink, hearing intact, trachea midline. Lungs- Clear to ausculation bilaterally, normal work of breathing Heart- Regular rate and rhythm, no murmurs, rubs or gallops  GI- soft, NT, ND, + BS Extremities- no clubbing, cyanosis. Trace bilateral edema MS- no significant deformity or atrophy Skin- no rash or lesion Psych- euthymic mood, full affect Neuro- strength and sensation are intact   Wt Readings from Last 3 Encounters:  02/20/20 106.6 kg  12/26/19 106.6 kg  11/24/19 106.4 kg    EKG today demonstrates AS VP rhythm HR 78, PR 124, QRS 154, QTc 483  Echo 07/19/18 demonstrated  - Left ventricle: The cavity size was normal. Wall thickness was   normal. Systolic function was normal. The estimated ejection   fraction was in the range of 55% to 60%. Wall motion was normal;  there were no  regional wall motion abnormalities. Indeterminate   diastolic function. - Aortic valve: Mildly to moderately calcified annulus. Trileaflet. - Mitral valve: Mildly thickened leaflets. There was mild   regurgitation. - Right ventricle: Pacer wire or catheter noted in right ventricle. - Right atrium: Central venous pressure (est): 3 mm Hg. - Atrial septum: No defect or patent foramen ovale was identified. - Tricuspid valve: There was trivial regurgitation. - Pulmonary arteries: Systolic pressure could not be accurately   estimated. - Pericardium, extracardiac: There was no pericardial effusion.  Epic records are reviewed at length today  Assessment and Plan:  1. Persistent atrial fibrillation Patient asymptomatic with her arrhythmia.  Continue Eliquis 5 mg BID Continue Toprol 100 mg daily Lifestyle changes as below.  This patients CHA2DS2-VASc Score and unadjusted Ischemic Stroke Rate (% per year) is equal to 9.7 % stroke rate/year from a score of 6  Above score calculated as 1 point each if present [CHF, HTN, DM, Vascular=MI/PAD/Aortic Plaque, Age if 65-74, or Female] Above score calculated as 2 points each if present [Age > 75, or Stroke/TIA/TE]  2. Obesity Body mass index is 37.93 kg/m. Lifestyle modification was discussed and encouraged including regular physical activity and weight reduction.  3. Obstructive sleep apnea The importance of adequate treatment of sleep apnea was discussed today in order to improve our ability to maintain sinus rhythm long term. Encouraged CPAP therapy.  4. CAD No anginal symptoms. Followed by Dr Domenic Polite.  5. HTN Stable, no changes today.  6. Second degree AV block type II S/p PPM, followed by Dr [name] and the device clinic.    Follow up with Dr Domenic Polite as scheduled. AF clinic in 4 months. She prefers in person visits.    [facility] [address] [place], [place]  [address] [phone] 02/20/2020 11:43 AM

## 2020-03-05 ENCOUNTER — Other Ambulatory Visit: Payer: Self-pay | Admitting: Internal Medicine

## 2020-03-08 ENCOUNTER — Ambulatory Visit (INDEPENDENT_AMBULATORY_CARE_PROVIDER_SITE_OTHER): Payer: Medicare HMO | Admitting: *Deleted

## 2020-03-08 DIAGNOSIS — I480 Paroxysmal atrial fibrillation: Secondary | ICD-10-CM

## 2020-03-08 LAB — CUP PACEART REMOTE DEVICE CHECK
Battery Impedance: 1491 Ohm
Battery Remaining Longevity: 35 mo
Battery Voltage: 2.75 V
Brady Statistic AP VP Percent: 78 %
Brady Statistic AP VS Percent: 2 %
Brady Statistic AS VP Percent: 8 %
Brady Statistic AS VS Percent: 13 %
Date Time Interrogation Session: 20210423105345
Implantable Lead Implant Date: 20111121
Implantable Lead Implant Date: 20111121
Implantable Lead Location: 753859
Implantable Lead Location: 753860
Implantable Lead Model: 4469
Implantable Lead Model: 4470
Implantable Lead Serial Number: 548226
Implantable Lead Serial Number: 687643
Implantable Pulse Generator Implant Date: 20111121
Lead Channel Impedance Value: 426 Ohm
Lead Channel Impedance Value: 463 Ohm
Lead Channel Pacing Threshold Amplitude: 0.625 V
Lead Channel Pacing Threshold Amplitude: 0.75 V
Lead Channel Pacing Threshold Pulse Width: 0.4 ms
Lead Channel Pacing Threshold Pulse Width: 0.4 ms
Lead Channel Setting Pacing Amplitude: 2.5 V
Lead Channel Setting Pacing Amplitude: 2.75 V
Lead Channel Setting Pacing Pulse Width: 0.4 ms
Lead Channel Setting Sensing Sensitivity: 5.6 mV

## 2020-03-08 NOTE — Progress Notes (Signed)
PPM Remote  

## 2020-03-31 NOTE — Progress Notes (Signed)
Cardiology Office Note  Date: 04/01/2020   ID: Brandi Hunter, DOB 08-Sep-1941, MRN [id number]  PCP:  [name], FNP  Cardiologist:  Rozann Lesches, MD Electrophysiologist:  None   Chief Complaint  Patient presents with  . Cardiac follow-up    History of Present Illness: Brandi Hunter is a 79 y.o. female last assessed via telehealth encounter in January.  Interval follow-up noted in the atrial fibrillation clinic, I reviewed the note from April. She presents for a routine visit. No definite sense of palpitations reported. She remains functional with ADLs, still using supplemental oxygen as before with severe COPD.  She sees Dr. [name], Medtronic pacemaker in place. Device function was normal as of April.  CHA2DS2-VASc score is 6.  She continues on heart rate control strategy with anticoagulation at this point. No reported bleeding problems. She is on dual AV nodal blockers including Cardizem CD and Toprol-XL.  She states that she has had both doses of the coronavirus vaccine.  Past Medical History:  Diagnosis Date  . Asthma   . CAD (coronary artery disease)    a. catheterization in 03/2018 showing 90% ostial OM stenosis with medical management recommended at that time  . Chronic anticoagulation   . COPD (chronic obstructive pulmonary disease) ([place])   . DJD (degenerative joint disease)   . Essential hypertension   . GERD (gastroesophageal reflux disease)   . History of home oxygen therapy   . Hyperlipidemia   . Mobitz (type) II atrioventricular block   . Obesity   . Pacemaker    Implanted by Dr [name] (MDT) 10/06/10  . PAF (paroxysmal atrial fibrillation) ([place])   . Pancreatitis 2010 OR 2011  . Sleep apnea    CPAP  . Type 2 diabetes mellitus ([place])     Past Surgical History:  Procedure Laterality Date  . CARDIOVERSION N/A 08/18/2019   Procedure: CARDIOVERSION;  Surgeon: Lelon Perla, MD;  Location: [facility] ENDOSCOPY;  Service: Cardiovascular;  Laterality: N/A;  .  CATARACT EXTRACTION W/PHACO  11/02/2011   Procedure: CATARACT EXTRACTION PHACO AND INTRAOCULAR LENS PLACEMENT ([place]);  Surgeon: Williams Che;  [facility]: [facility] ORS;  Service: Ophthalmology;  Laterality: Right;  CDE=7.33  . CATARACT EXTRACTION W/PHACO  12/07/2011   Procedure: CATARACT EXTRACTION PHACO AND INTRAOCULAR LENS PLACEMENT (I[facility]);  Surgeon: Williams Che, MD;  Location: [facility] OR[facility];  Service: Ophthalmology;  Laterality: Left;  CDE 3.61  . COLONOSCOPY WITH PROPOFOL N/A 08/09/2014   Procedure: COLONOSCOPY WITH PROPOFOL;  Surgeon: Juanita Craver, MD;  [facility] ENDOSCOPY;  Service: Endoscopy;  Laterality: N/A;  . CYSTOCELE REPAIR    . ESOPHAGOGASTRODUODENOSCOPY (EGD) WITH PROPOFOL N/A 08/09/2014   Procedure: ESOPHAGOGASTRODUODENOSCOPY (EGD) WITH PROPOFOL;  Surgeon: Juanita Craver, MD;  [facility] ENDOS[facility];  Service: Endoscopy;  Laterality: N/A;  . EYE SURGERY  11/01/2012   BOTH EYES CATARACTS  . INSERT / REPLACE / REMOVE PACEMAKER  10/06/10   MDT  implanted by Dr [name]  . KNEE ARTHROSCOPY     both  . LEFT HEART CATH AND CORONARY ANGIOGRAPHY N/A 03/24/2018   Procedure: LEFT HEART CATH AND CORONARY ANGIOGRAPHY;  Surgeon: Lorretta Harp, MD;  Location: [facility] CV LAB;  Service: Cardiovascular;  Laterality: N/A;  . OVARY SURGERY     removal  . REPAIR RECTOCELE    . SIMPLE MASTECTOMY WITH AXILLARY SENTINEL NODE BIOPSY Left 01/09/2015   Procedure: Irrigation and Drainage Abcess left Axilla;  Surgeon: Jackolyn Confer, MD;  Location: [facility] ORS;  Service: General;  Laterality: Left;  . TONSILLECTOMY  AGE 70  . TOTAL ABDOMINAL HYSTERECTOMY  1971    Current Outpatient Medications  Medication Sig Dispense Refill  . albuterol (PROAIR HFA) 108 (90 BASE) MCG/ACT inhaler Inhale 2 puffs into the lungs every 4 (four) hours as needed for wheezing.     Marland Kitchen apixaban (ELIQUIS) 5 MG TABS tablet Take 1 tablet (5 mg total) by mouth 2 (two) times daily. 180 tablet 3  . atorvastatin (LIPITOR) 40 MG tablet Take 40  mg by mouth at bedtime.     . budesonide-formoterol (SYMBICORT) 160-4.5 MCG/ACT inhaler Inhale 2 puffs into the lungs 2 (two) times daily.    Marland Kitchen diltiazem (CARDIZEM CD) 120 MG 24 hr capsule Take 1 capsule (120 mg total) by mouth daily. 90 capsule 3  . DULoxetine (CYMBALTA) 30 MG capsule daily.    . famotidine (PEPCID) 20 MG tablet Take 20 mg by mouth 2 (two) times daily.     . fenofibrate 160 MG tablet Take 160 mg by mouth every evening.    . furosemide (LASIX) 20 MG tablet Take 10 mg by mouth daily.     Marland Kitchen ipratropium-albuterol (DUONEB) 0.5-2.5 (3) MG/3ML SOLN Take 3 mLs by nebulization 4 (four) times daily. 360 mL 1  . isosorbide mononitrate (IMDUR) 30 MG 24 hr tablet Take 1 tablet (30 mg total) by mouth 2 (two) times daily. 180 tablet 3  . LORazepam (ATIVAN) 0.5 MG tablet Take 0.25-0.5 mg by mouth 2 (two) times daily as needed for anxiety.     . metFORMIN (GLUCOPHAGE) 500 MG tablet Take 500 mg by mouth 2 (two) times daily with a meal.    . metoprolol succinate (TOPROL-XL) 100 MG 24 hr tablet Take 1 tablet by mouth once daily 90 tablet 3  . nitroGLYCERIN (NITROSTAT) 0.4 MG SL tablet DISSOLVE ONE TABLET UNDER THE TONGUE EVERY 5 MINUTES AS NEEDED FOR CHEST PAIN.  DO NOT EXCEED A TOTAL OF 3 DOSES IN 15 MINUTES 25 tablet 1  . OXYGEN Inhale 2 L into the lungs continuous.    . potassium chloride SA (KLOR-CON) 20 MEQ tablet Take 20 mEq by mouth 2 (two) times daily.    . Probiotic Product (ALIGN PO) Take 1 capsule by mouth daily.     Marland Kitchen spironolactone (ALDACTONE) 25 MG tablet Take 1 tablet by mouth once daily 90 tablet 0   No current facility-administered medications for this visit.   Allergies:  Bee venom and Latex   ROS:   Recurrent headaches recently.  Physical Exam: VS:  BP 140/68   Pulse 72   Ht 5\' 6"  (1.676 m)   Wt 226 lb (102.5 kg)   SpO2 99%   BMI 36.48 kg/m , BMI Body mass index is 36.48 kg/m.  Wt Readings from Last 3 Encounters:  04/01/20 226 lb (102.5 kg)  02/20/20 235 lb (106.6  kg)  12/26/19 235 lb (106.6 kg)    General: Patient appears comfortable at rest. HEENT: Conjunctiva and lids normal, wearing a mask. Neck: Supple, no elevated JVP or carotid bruits, no thyromegaly. Lungs: Manage breath sounds without wheezing, nonlabored breathing at rest. Cardiac: Largely regular with some ectopy, no S3, soft systolic murmur. Extremities: Mild lower leg edema.  ECG:  An ECG dated 02/20/2020 was personally reviewed today and demonstrated:  Ventricular paced rhythm with underlying atrial fibrillation.  Recent Labwork: 08/29/2019: B Natriuretic Peptide 298.0 12/26/2019: BUN 23; Creatinine, Ser 1.14; Hemoglobin 9.3; Platelets 331; Potassium 3.9; Sodium 141     Component Value  Date/Time   CHOL 125 03/24/2018 0508   TRIG 194 (H) 03/24/2018 0508   HDL 35 (L) 03/24/2018 0508   CHOLHDL 3.6 03/24/2018 0508   VLDL 39 03/24/2018 0508   LDLCALC 51 03/24/2018 0508    Other Studies Reviewed Today:  Echocardiogram 07/19/2018: Study Conclusions  - Left ventricle: The cavity size was normal. Wall thickness was normal. Systolic function was normal. The estimated ejection fraction was in the range of 55% to 60%. Wall motion was normal; there were no regional wall motion abnormalities. Indeterminate diastolic function. - Aortic valve: Mildly to moderately calcified annulus. Trileaflet. - Mitral valve: Mildly thickened leaflets. There was mild regurgitation. - Right ventricle: Pacer wire or catheter noted in right ventricle. - Right atrium: Central venous pressure (est): 3 mm Hg. - Atrial septum: No defect or patent foramen ovale was identified. - Tricuspid valve: There was trivial regurgitation. - Pulmonary arteries: Systolic pressure could not be accurately estimated. - Pericardium, extracardiac: There was no pericardial effusion.  Assessment and Plan:  1. Persistent atrial fibrillation. We will plan strategy of heart rate control and anticoagulation going  forward. Continue Cardizem CD and Toprol-XL in addition to Eliquis for stroke prophylaxis. I reviewed her lab work from February. She will need follow-up CBC and BMET around the time of her next visit.  2. Second-degree heart block status post Medtronic pacemaker with follow-up by Dr. [name]. Device function was normal by last interrogation.  3. 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 No progressive angina reported. Continue Lipitor, Imdur, and beta-blocker.  Medication Adjustments/Labs and Tests Ordered: Current medicines are reviewed at length with the patient today.  Concerns regarding medicines are outlined above.   Tests Ordered: No orders of the defined types were placed in this encounter.   Medication Changes: No orders of the defined types were placed in this encounter.   Disposition:  Follow up 6 months in the [place] office.  Signed, Satira Sark, MD, [facility] 04/01/2020 12:12 PM    [facility] [place]. [address], [place], [place] [address] Phone: [phone]; Fax: [phone]

## 2020-04-01 ENCOUNTER — Other Ambulatory Visit: Payer: Self-pay

## 2020-04-01 ENCOUNTER — Encounter: Payer: Self-pay | Admitting: Cardiology

## 2020-04-01 ENCOUNTER — Ambulatory Visit: Payer: Medicare HMO | Admitting: Cardiology

## 2020-04-01 VITALS — BP 140/68 | HR 72 | Ht 66.0 in | Wt 226.0 lb

## 2020-04-01 DIAGNOSIS — I25119 Atherosclerotic heart disease of native coronary artery with unspecified angina pectoris: Secondary | ICD-10-CM | POA: Diagnosis not present

## 2020-04-01 DIAGNOSIS — I4819 Other persistent atrial fibrillation: Secondary | ICD-10-CM | POA: Diagnosis not present

## 2020-04-01 DIAGNOSIS — Z950 Presence of cardiac pacemaker: Secondary | ICD-10-CM | POA: Diagnosis not present

## 2020-04-01 NOTE — Patient Instructions (Signed)
Medication Instructions:  °Your physician recommends that you continue on your current medications as directed. Please refer to the Current Medication list given to you today. ° °*If you need a refill on your cardiac medications before your next appointment, please call your pharmacy* ° ° °Lab Work: °None today °If you have labs (blood work) drawn today and your tests are completely normal, you will receive your results only by: °• MyChart Message (if you have MyChart) OR °• A paper copy in the mail °If you have any lab test that is abnormal or we need to change your treatment, we will call you to review the results. ° ° °Testing/Procedures: °None today ° ° °Follow-Up: °At [facility], you and your health needs are our priority.  As part of our continuing mission to provide you with exceptional heart care, we have created designated Provider Care Teams.  These Care Teams include your primary Cardiologist (physician) and Advanced Practice Providers (APPs -  Physician Assistants and Nurse Practitioners) who all work together to provide you with the care you need, when you need it. ° °We recommend signing up for the patient portal called "MyChart".  Sign up information is provided on this After Visit Summary.  MyChart is used to connect with patients for Virtual Visits (Telemedicine).  Patients are able to view lab/test results, encounter notes, upcoming appointments, etc.  Non-urgent messages can be sent to your provider as well.   °To learn more about what you can do with MyChart, go to [email or web address].   ° °Your next appointment:   °6 month(s) ° °The format for your next appointment:   °In Person ° °Provider:   °Samuel McDowell, MD ° ° °Other Instructions °None ° ° ° ° °Thank you for choosing [facility] ! ° ° ° ° ° ° ° ° °

## 2020-05-03 ENCOUNTER — Telehealth: Payer: Self-pay | Admitting: Cardiology

## 2020-05-03 NOTE — Telephone Encounter (Signed)
° ° ° °  Covering for Dr. Domenic Polite - In regards to the pain along her arm and back, they must have thought it was more MSK given the treatments they recommended. If she has progressive dyspnea or worsening chest pain over the weekend she should proceed to the ED for further evaluation. In regards to her ankle edema, I would recommend she take her dose today upon returning home and can take an extra tablet tomorrow if needed. Would follow daily weights until her appointment on Monday.   Signed, Erma Heritage, PA-C 05/03/2020, 3:45 PM Pager: [phone]

## 2020-05-03 NOTE — Telephone Encounter (Signed)
New Message    Patient is at the Urgent care in [place] she was seen for pain in arm and back - they checked her out and said that she needed to be seen for CHF, I set her up an appt for Monday in [place] with Dr Domenic Polite - she wants to know what she is suppose to do for the pain?

## 2020-05-03 NOTE — Telephone Encounter (Signed)
LM for patient to call back-cc

## 2020-05-03 NOTE — Telephone Encounter (Signed)
Pt states cxr done in Urgent care was clear.Patient feels it is similar to pneumonia.Was given naprosyn cream to use.Using Tylenol arthritis. Her ankles were slightly puffy because she does not take lasix when she goes outside her home.   Has apt 6/21 at the [facility] office with Dr.McDowell

## 2020-05-05 ENCOUNTER — Emergency Department (HOSPITAL_COMMUNITY): Payer: Medicare HMO

## 2020-05-05 ENCOUNTER — Encounter (HOSPITAL_COMMUNITY): Payer: Self-pay | Admitting: Emergency Medicine

## 2020-05-05 ENCOUNTER — Inpatient Hospital Stay (HOSPITAL_COMMUNITY)
Admit: 2020-05-05 | Discharge: 2020-05-07 | DRG: 291 | Disposition: A | Discharge status: Home Health | Payer: Medicare HMO | Attending: Family Medicine | Admitting: Family Medicine

## 2020-05-05 ENCOUNTER — Other Ambulatory Visit: Payer: Self-pay

## 2020-05-05 DIAGNOSIS — Z87891 Personal history of nicotine dependence: Secondary | ICD-10-CM

## 2020-05-05 DIAGNOSIS — G4733 Obstructive sleep apnea (adult) (pediatric): Secondary | ICD-10-CM | POA: Diagnosis present

## 2020-05-05 DIAGNOSIS — I480 Paroxysmal atrial fibrillation: Secondary | ICD-10-CM | POA: Diagnosis present

## 2020-05-05 DIAGNOSIS — J441 Chronic obstructive pulmonary disease with (acute) exacerbation: Secondary | ICD-10-CM | POA: Diagnosis present

## 2020-05-05 DIAGNOSIS — M25512 Pain in left shoulder: Secondary | ICD-10-CM | POA: Diagnosis present

## 2020-05-05 DIAGNOSIS — I5020 Unspecified systolic (congestive) heart failure: Secondary | ICD-10-CM

## 2020-05-05 DIAGNOSIS — K219 Gastro-esophageal reflux disease without esophagitis: Secondary | ICD-10-CM | POA: Diagnosis present

## 2020-05-05 DIAGNOSIS — I5033 Acute on chronic diastolic (congestive) heart failure: Secondary | ICD-10-CM | POA: Diagnosis present

## 2020-05-05 DIAGNOSIS — M5412 Radiculopathy, cervical region: Secondary | ICD-10-CM | POA: Diagnosis present

## 2020-05-05 DIAGNOSIS — N183 Chronic kidney disease, stage 3 unspecified: Secondary | ICD-10-CM | POA: Diagnosis present

## 2020-05-05 DIAGNOSIS — D649 Anemia, unspecified: Secondary | ICD-10-CM | POA: Diagnosis present

## 2020-05-05 DIAGNOSIS — N1832 Chronic kidney disease, stage 3b: Secondary | ICD-10-CM | POA: Diagnosis present

## 2020-05-05 DIAGNOSIS — Z9012 Acquired absence of left breast and nipple: Secondary | ICD-10-CM

## 2020-05-05 DIAGNOSIS — Z961 Presence of intraocular lens: Secondary | ICD-10-CM | POA: Diagnosis present

## 2020-05-05 DIAGNOSIS — Z8042 Family history of malignant neoplasm of prostate: Secondary | ICD-10-CM

## 2020-05-05 DIAGNOSIS — J9621 Acute and chronic respiratory failure with hypoxia: Secondary | ICD-10-CM | POA: Diagnosis present

## 2020-05-05 DIAGNOSIS — I441 Atrioventricular block, second degree: Secondary | ICD-10-CM | POA: Diagnosis present

## 2020-05-05 DIAGNOSIS — Z8249 Family history of ischemic heart disease and other diseases of the circulatory system: Secondary | ICD-10-CM

## 2020-05-05 DIAGNOSIS — Z7951 Long term (current) use of inhaled steroids: Secondary | ICD-10-CM

## 2020-05-05 DIAGNOSIS — Z9842 Cataract extraction status, left eye: Secondary | ICD-10-CM

## 2020-05-05 DIAGNOSIS — Z90710 Acquired absence of both cervix and uterus: Secondary | ICD-10-CM

## 2020-05-05 DIAGNOSIS — I4510 Unspecified right bundle-branch block: Secondary | ICD-10-CM | POA: Diagnosis present

## 2020-05-05 DIAGNOSIS — Z7984 Long term (current) use of oral hypoglycemic drugs: Secondary | ICD-10-CM

## 2020-05-05 DIAGNOSIS — D631 Anemia in chronic kidney disease: Secondary | ICD-10-CM | POA: Diagnosis present

## 2020-05-05 DIAGNOSIS — M25519 Pain in unspecified shoulder: Secondary | ICD-10-CM

## 2020-05-05 DIAGNOSIS — Z833 Family history of diabetes mellitus: Secondary | ICD-10-CM

## 2020-05-05 DIAGNOSIS — I2510 Atherosclerotic heart disease of native coronary artery without angina pectoris: Secondary | ICD-10-CM | POA: Diagnosis present

## 2020-05-05 DIAGNOSIS — I130 Hypertensive heart and chronic kidney disease with heart failure and stage 1 through stage 4 chronic kidney disease, or unspecified chronic kidney disease: Principal | ICD-10-CM | POA: Diagnosis present

## 2020-05-05 DIAGNOSIS — Z7901 Long term (current) use of anticoagulants: Secondary | ICD-10-CM

## 2020-05-05 DIAGNOSIS — E669 Obesity, unspecified: Secondary | ICD-10-CM | POA: Diagnosis present

## 2020-05-05 DIAGNOSIS — F339 Major depressive disorder, recurrent, unspecified: Secondary | ICD-10-CM | POA: Diagnosis present

## 2020-05-05 DIAGNOSIS — I081 Rheumatic disorders of both mitral and tricuspid valves: Secondary | ICD-10-CM | POA: Diagnosis present

## 2020-05-05 DIAGNOSIS — E1122 Type 2 diabetes mellitus with diabetic chronic kidney disease: Secondary | ICD-10-CM | POA: Diagnosis present

## 2020-05-05 DIAGNOSIS — Z79899 Other long term (current) drug therapy: Secondary | ICD-10-CM

## 2020-05-05 DIAGNOSIS — Z20822 Contact with and (suspected) exposure to covid-19: Secondary | ICD-10-CM | POA: Diagnosis present

## 2020-05-05 DIAGNOSIS — Z9841 Cataract extraction status, right eye: Secondary | ICD-10-CM

## 2020-05-05 DIAGNOSIS — I10 Essential (primary) hypertension: Secondary | ICD-10-CM | POA: Diagnosis present

## 2020-05-05 DIAGNOSIS — I272 Pulmonary hypertension, unspecified: Secondary | ICD-10-CM | POA: Diagnosis present

## 2020-05-05 DIAGNOSIS — Z91040 Latex allergy status: Secondary | ICD-10-CM

## 2020-05-05 DIAGNOSIS — Z91030 Bee allergy status: Secondary | ICD-10-CM

## 2020-05-05 DIAGNOSIS — E782 Mixed hyperlipidemia: Secondary | ICD-10-CM | POA: Diagnosis present

## 2020-05-05 DIAGNOSIS — Z6836 Body mass index (BMI) 36.0-36.9, adult: Secondary | ICD-10-CM

## 2020-05-05 DIAGNOSIS — Z950 Presence of cardiac pacemaker: Secondary | ICD-10-CM

## 2020-05-05 DIAGNOSIS — E1140 Type 2 diabetes mellitus with diabetic neuropathy, unspecified: Secondary | ICD-10-CM | POA: Diagnosis present

## 2020-05-05 DIAGNOSIS — E119 Type 2 diabetes mellitus without complications: Secondary | ICD-10-CM

## 2020-05-05 DIAGNOSIS — I2583 Coronary atherosclerosis due to lipid rich plaque: Secondary | ICD-10-CM | POA: Diagnosis present

## 2020-05-05 LAB — COMPREHENSIVE METABOLIC PANEL
ALT: 31 U/L (ref 0–44)
AST: 39 U/L (ref 15–41)
Albumin: 3.7 g/dL (ref 3.5–5.0)
Alkaline Phosphatase: 25 U/L — ABNORMAL LOW (ref 38–126)
Anion gap: 10 (ref 5–15)
BUN: 24 mg/dL — ABNORMAL HIGH (ref 8–23)
CO2: 26 mmol/L (ref 22–32)
Calcium: 9.1 mg/dL (ref 8.9–10.3)
Chloride: 102 mmol/L (ref 98–111)
Creatinine, Ser: 1.09 mg/dL — ABNORMAL HIGH (ref 0.44–1.00)
GFR calc Af Amer: 57 mL/min — ABNORMAL LOW (ref >60)
GFR calc non Af Amer: 49 mL/min — ABNORMAL LOW (ref >60)
Glucose, Bld: 158 mg/dL — ABNORMAL HIGH (ref 70–99)
Potassium: 4.1 mmol/L (ref 3.5–5.1)
Sodium: 138 mmol/L (ref 135–145)
Total Bilirubin: 1.1 mg/dL (ref 0.3–1.2)
Total Protein: 6.9 g/dL (ref 6.5–8.1)

## 2020-05-05 LAB — TROPONIN I (HIGH SENSITIVITY)
Troponin I (High Sensitivity): 13 ng/L (ref <18)
Troponin I (High Sensitivity): 16 ng/L (ref <18)

## 2020-05-05 LAB — CBC WITH DIFFERENTIAL/PLATELET
Abs Immature Granulocytes: 0.02 10*3/uL (ref 0.00–0.07)
Basophils Absolute: 0 10*3/uL (ref 0.0–0.1)
Basophils Relative: 0 %
Eosinophils Absolute: 0.2 10*3/uL (ref 0.0–0.5)
Eosinophils Relative: 3 %
HCT: 33.7 % — ABNORMAL LOW (ref 36.0–46.0)
Hemoglobin: 10.1 g/dL — ABNORMAL LOW (ref 12.0–15.0)
Immature Granulocytes: 0 %
Lymphocytes Relative: 13 %
Lymphs Abs: 1 10*3/uL (ref 0.7–4.0)
MCH: 25.2 pg — ABNORMAL LOW (ref 26.0–34.0)
MCHC: 30 g/dL (ref 30.0–36.0)
MCV: 84 fL (ref 80.0–100.0)
Monocytes Absolute: 0.5 10*3/uL (ref 0.1–1.0)
Monocytes Relative: 7 %
Neutro Abs: 5.8 10*3/uL (ref 1.7–7.7)
Neutrophils Relative %: 77 %
Platelets: 356 10*3/uL (ref 150–400)
RBC: 4.01 MIL/uL (ref 3.87–5.11)
RDW: 18.1 % — ABNORMAL HIGH (ref 11.5–15.5)
WBC: 7.5 10*3/uL (ref 4.0–10.5)
nRBC: 0 % (ref 0.0–0.2)

## 2020-05-05 LAB — D-DIMER, QUANTITATIVE: D-Dimer, Quant: 0.34 ug/mL-FEU (ref 0.00–0.50)

## 2020-05-05 LAB — MAGNESIUM: Magnesium: 1.4 mg/dL — ABNORMAL LOW (ref 1.7–2.4)

## 2020-05-05 LAB — SARS CORONAVIRUS 2 BY RT PCR (HOSPITAL ORDER, PERFORMED IN ~~LOC~~ HOSPITAL LAB): SARS Coronavirus 2: NEGATIVE

## 2020-05-05 LAB — CBG MONITORING, ED: Glucose-Capillary: 173 mg/dL — ABNORMAL HIGH (ref 70–99)

## 2020-05-05 LAB — PROTIME-INR
INR: 1.4 — ABNORMAL HIGH (ref 0.8–1.2)
Prothrombin Time: 16.1 seconds — ABNORMAL HIGH (ref 11.4–15.2)

## 2020-05-05 LAB — PHOSPHORUS: Phosphorus: 3.9 mg/dL (ref 2.5–4.6)

## 2020-05-05 LAB — BRAIN NATRIURETIC PEPTIDE: B Natriuretic Peptide: 1222 pg/mL — ABNORMAL HIGH (ref 0.0–100.0)

## 2020-05-05 MED ORDER — ACETAMINOPHEN 325 MG PO TABS
650.0000 mg | ORAL_TABLET | Freq: Four times a day (QID) | ORAL | Status: DC | PRN
  Administered 2020-05-06 – 2020-05-07 (×2): 650 mg via ORAL
  Filled 2020-05-05 (×2): qty 2

## 2020-05-05 MED ORDER — DICLOFENAC SODIUM 1 % EX GEL
2.0000 g | Freq: Two times a day (BID) | CUTANEOUS | Status: DC | PRN
  Filled 2020-05-05 (×2): qty 100

## 2020-05-05 MED ORDER — FAMOTIDINE 20 MG PO TABS
20.0000 mg | ORAL_TABLET | Freq: Two times a day (BID) | ORAL | Status: DC
  Administered 2020-05-05 – 2020-05-07 (×4): 20 mg via ORAL
  Filled 2020-05-05 (×4): qty 1

## 2020-05-05 MED ORDER — FUROSEMIDE 10 MG/ML IJ SOLN
40.0000 mg | Freq: Two times a day (BID) | INTRAMUSCULAR | Status: AC
  Administered 2020-05-06 (×2): 40 mg via INTRAVENOUS
  Filled 2020-05-05 (×2): qty 4

## 2020-05-05 MED ORDER — INSULIN ASPART 100 UNIT/ML ~~LOC~~ SOLN
0.0000 [IU] | Freq: Three times a day (TID) | SUBCUTANEOUS | Status: DC
  Administered 2020-05-06: 3 [IU] via SUBCUTANEOUS
  Administered 2020-05-06: 2 [IU] via SUBCUTANEOUS
  Administered 2020-05-06: 3 [IU] via SUBCUTANEOUS
  Administered 2020-05-07 (×2): 2 [IU] via SUBCUTANEOUS
  Filled 2020-05-05: qty 1

## 2020-05-05 MED ORDER — IPRATROPIUM-ALBUTEROL 0.5-2.5 (3) MG/3ML IN SOLN: 3.0000 mL | RESPIRATORY_TRACT | Status: DC | PRN

## 2020-05-05 MED ORDER — ISOSORBIDE MONONITRATE ER 60 MG PO TB24
30.0000 mg | ORAL_TABLET | Freq: Two times a day (BID) | ORAL | Status: DC
  Administered 2020-05-06 – 2020-05-07 (×3): 30 mg via ORAL
  Filled 2020-05-05 (×7): qty 1

## 2020-05-05 MED ORDER — LORAZEPAM 0.5 MG PO TABS: 0.5000 mg | ORAL_TABLET | Freq: Two times a day (BID) | ORAL | Status: DC | PRN

## 2020-05-05 MED ORDER — MAGNESIUM SULFATE 2 GM/50ML IV SOLN
2.0000 g | Freq: Once | INTRAVENOUS | Status: AC
  Administered 2020-05-05: 2 g via INTRAVENOUS
  Filled 2020-05-05: qty 50

## 2020-05-05 MED ORDER — OXYCODONE-ACETAMINOPHEN 5-325 MG PO TABS
1.0000 | ORAL_TABLET | ORAL | Status: AC | PRN
  Administered 2020-05-06 (×2): 1 via ORAL
  Filled 2020-05-05 (×2): qty 1

## 2020-05-05 MED ORDER — FENOFIBRATE 160 MG PO TABS
160.0000 mg | ORAL_TABLET | Freq: Every evening | ORAL | Status: DC
  Administered 2020-05-06 (×2): 160 mg via ORAL
  Filled 2020-05-05 (×5): qty 1

## 2020-05-05 MED ORDER — APIXABAN 5 MG PO TABS
5.0000 mg | ORAL_TABLET | Freq: Two times a day (BID) | ORAL | Status: DC
  Administered 2020-05-05 – 2020-05-07 (×4): 5 mg via ORAL
  Filled 2020-05-05 (×4): qty 1

## 2020-05-05 MED ORDER — SPIRONOLACTONE 25 MG PO TABS
25.0000 mg | ORAL_TABLET | Freq: Every day | ORAL | Status: DC
  Administered 2020-05-06 – 2020-05-07 (×2): 25 mg via ORAL
  Filled 2020-05-05 (×4): qty 1

## 2020-05-05 MED ORDER — METFORMIN HCL 500 MG PO TABS
1000.0000 mg | ORAL_TABLET | Freq: Two times a day (BID) | ORAL | Status: DC
  Administered 2020-05-05 – 2020-05-07 (×4): 1000 mg via ORAL
  Filled 2020-05-05 (×4): qty 2

## 2020-05-05 MED ORDER — ACETAMINOPHEN 650 MG RE SUPP: 650.0000 mg | Freq: Four times a day (QID) | RECTAL | Status: DC | PRN

## 2020-05-05 MED ORDER — HYDRALAZINE HCL 20 MG/ML IJ SOLN
2.0000 mg | Freq: Once | INTRAMUSCULAR | Status: DC
  Filled 2020-05-05: qty 1

## 2020-05-05 MED ORDER — ALBUTEROL SULFATE HFA 108 (90 BASE) MCG/ACT IN AERS
2.0000 | INHALATION_SPRAY | Freq: Once | RESPIRATORY_TRACT | Status: AC
  Administered 2020-05-05: 2 via RESPIRATORY_TRACT

## 2020-05-05 MED ORDER — HYDRALAZINE HCL 25 MG PO TABS
25.0000 mg | ORAL_TABLET | Freq: Four times a day (QID) | ORAL | Status: DC | PRN
  Administered 2020-05-06: 25 mg via ORAL
  Filled 2020-05-05 (×2): qty 1

## 2020-05-05 MED ORDER — MOMETASONE FURO-FORMOTEROL FUM 200-5 MCG/ACT IN AERO
2.0000 | INHALATION_SPRAY | Freq: Two times a day (BID) | RESPIRATORY_TRACT | Status: DC
  Administered 2020-05-05 – 2020-05-07 (×4): 2 via RESPIRATORY_TRACT
  Filled 2020-05-05: qty 8.8

## 2020-05-05 MED ORDER — FUROSEMIDE 10 MG/ML IJ SOLN
40.0000 mg | Freq: Once | INTRAMUSCULAR | Status: AC
  Administered 2020-05-05: 40 mg via INTRAVENOUS
  Filled 2020-05-05: qty 4

## 2020-05-05 MED ORDER — DILTIAZEM HCL ER COATED BEADS 120 MG PO CP24
120.0000 mg | ORAL_CAPSULE | Freq: Every day | ORAL | Status: DC
  Administered 2020-05-06 – 2020-05-07 (×2): 120 mg via ORAL
  Filled 2020-05-05 (×4): qty 1

## 2020-05-05 MED ORDER — POTASSIUM CHLORIDE CRYS ER 20 MEQ PO TBCR
20.0000 meq | EXTENDED_RELEASE_TABLET | Freq: Two times a day (BID) | ORAL | Status: DC
  Administered 2020-05-05 – 2020-05-07 (×4): 20 meq via ORAL
  Filled 2020-05-05 (×4): qty 1

## 2020-05-05 MED ORDER — ALBUTEROL SULFATE HFA 108 (90 BASE) MCG/ACT IN AERS
2.0000 | INHALATION_SPRAY | Freq: Once | RESPIRATORY_TRACT | Status: AC
  Administered 2020-05-05: 2 via RESPIRATORY_TRACT
  Filled 2020-05-05: qty 6.7

## 2020-05-05 MED ORDER — OXYCODONE-ACETAMINOPHEN 5-325 MG PO TABS
1.0000 | ORAL_TABLET | Freq: Once | ORAL | Status: AC
  Administered 2020-05-05: 1 via ORAL
  Filled 2020-05-05: qty 1

## 2020-05-05 MED ORDER — METOPROLOL SUCCINATE ER 50 MG PO TB24
100.0000 mg | ORAL_TABLET | Freq: Every morning | ORAL | Status: DC
  Administered 2020-05-06 – 2020-05-07 (×2): 100 mg via ORAL
  Filled 2020-05-05: qty 2
  Filled 2020-05-05: qty 4

## 2020-05-05 MED ORDER — ATORVASTATIN CALCIUM 40 MG PO TABS
40.0000 mg | ORAL_TABLET | Freq: Every day | ORAL | Status: DC
  Administered 2020-05-05 – 2020-05-06 (×2): 40 mg via ORAL
  Filled 2020-05-05 (×2): qty 1

## 2020-05-05 MED ORDER — ISOSORBIDE MONONITRATE ER 30 MG PO TB24
30.0000 mg | ORAL_TABLET | Freq: Two times a day (BID) | ORAL | Status: DC
  Filled 2020-05-05 (×2): qty 1

## 2020-05-05 MED ORDER — METHYLPREDNISOLONE SODIUM SUCC 125 MG IJ SOLR
125.0000 mg | Freq: Once | INTRAMUSCULAR | Status: AC
  Administered 2020-05-05: 125 mg via INTRAVENOUS
  Filled 2020-05-05: qty 2

## 2020-05-05 MED ORDER — DULOXETINE HCL 30 MG PO CPEP
30.0000 mg | ORAL_CAPSULE | Freq: Every day | ORAL | Status: DC
  Administered 2020-05-05 – 2020-05-06 (×2): 30 mg via ORAL
  Filled 2020-05-05 (×2): qty 1

## 2020-05-05 MED ORDER — NITROGLYCERIN 2 % TD OINT
1.0000 [in_us] | TOPICAL_OINTMENT | Freq: Four times a day (QID) | TRANSDERMAL | Status: AC
  Administered 2020-05-05 – 2020-05-06 (×2): 1 [in_us] via TOPICAL
  Filled 2020-05-05 (×2): qty 1

## 2020-05-05 NOTE — H&P (Signed)
History and Physical    Brandi Hunter BWI:[id number] DOB: May 31, 1941 DOA: 05/05/2020  PCP: [name], FNP  Patient coming from: Home.  I have personally briefly reviewed patient's old medical records in [place]  Chief Complaint: Left neck/shoulder pain and shortness of breath.  HPI: Brandi Hunter is a 79 y.o. female with medical history significant of asthma, chronic coagulation, COPD, type 2 diabetes, DJD, GERD, hypertension, hyperlipidemia, history of Mobitz type II heart block, history of pacemaker placement, obesity, paroxysmal atrial fibrillation, history of pancreatitis, sleep apnea on CPAP who is coming to the emergency department with a history of progressively worse left neck/shoulder pain for the past 2 weeks.  She denies any trauma to the area.  She also mentioned waking up with a sore throat this morning.  She has been having dyspnea, wheezing and nonproductive cough since last week.  She states that last night she was unable to sleep because she could not get comfortable when lying down due to dyspnea.  She has lower extremity edema.  She denies fever, chills, rhinorrhea, chest pain, palpitations, dizziness, diaphoresis, abdominal pain, nausea, emesis, diarrhea, constipation, melena or hematochezia.  No dysuria, frequency hematuria.  No polyuria, polydipsia, polyphagia or blurred vision.  ED Course: Initial vital signs were temperature 98.9 F, pulse 81, respirations 18, blood pressure 159/85 and O2 sat 100% on 5 LPM via [place].  In addition to supplemental oxygen, the patient received, albuterol MDI 2 puffs x 2, 125 mg of Solu-Medrol IVP x1, furosemide 40 mg IVP x1 and Percocet 5/325 one tablet p.o. once.  White count of 7.5, hemoglobin 10.1 g/dL and platelets 356.  D-dimer was 0.34.  CMP shows normal electrolytes, normal hepatic function, except for an alkaline phosphatase of 25 units/L.  Glucose 158, BUN 24 and creatinine 1.09 mg/dL.   Phosphorus 3.9 and magnesium 1.4 mg/dL.  Troponin was 13 and then 69 mg/L.  BNP was 1222.0 pg/mL.EKG was sinus rhythm with VPCs, LVH with secondary repolarization abnormality, anterior and inferior Q waves.  Please see tracing for further detail.  Imaging: Chest radiograph shows cardiomegaly with slight pulmonary vascular congestion, which is mildly increased since the previous study.  Please see image and radiology report for further detail.  Review of Systems: As per HPI otherwise all other systems reviewed and are negative.  Past Medical History:  Diagnosis Date  . Asthma   . CAD (coronary artery disease)    a. catheterization in 03/2018 showing 90% ostial OM stenosis with medical management recommended at that time  . Chronic anticoagulation   . COPD (chronic obstructive pulmonary disease) ([place])   . DJD (degenerative joint disease)   . Essential hypertension   . GERD (gastroesophageal reflux disease)   . History of home oxygen therapy   . Hyperlipidemia   . Mobitz (type) II atrioventricular block   . Obesity   . Pacemaker    Implanted by Dr [name] (MDT) 10/06/10  . PAF (paroxysmal atrial fibrillation) ([place])   . Pancreatitis 2010 OR 2011  . Sleep apnea    CPAP  . Type 2 diabetes mellitus ([place])     Past Surgical History:  Procedure Laterality Date  . CARDIOVERSION N/A 08/18/2019   Procedure: CARDIOVERSION;  Surgeon: Lelon Perla, MD;  Location: [facility] ENDOSCOPY;  Service: Cardiovascular;  Laterality: N/A;  . CATARACT EXTRACTION W/PHACO  11/02/2011   Procedure: CATARACT EXTRACTION PHACO AND INTRAOCULAR LENS PLACEMENT ([place]);  Surgeon: Williams Che;  Location: [facility] ORS;  Service: Ophthalmology;  Laterality:  Right;  CDE=7.33  . CATARACT EXTRACTION W/PHACO  12/07/2011   Procedure: CATARACT EXTRACTION PHACO AND INTRAOCULAR LENS PLACEMENT (IOC);  Surgeon: Williams Che, MD;  Location: [facility] ORS;  Service: Ophthalmology;  Laterality: Left;  CDE 3.61  . COLONOSCOPY WITH PROPOFOL N/A 08/09/2014   Procedure: COLONOSCOPY WITH  PROPOFOL;  Surgeon: Juanita Craver, MD;  Location: [facility] ENDOSCOPY;  Service: Endoscopy;  Laterality: N/A;  . CYSTOCELE REPAIR    . ESOPHAGOGASTRODUODENOSCOPY (EGD) WITH PROPOFOL N/A 08/09/2014   Procedure: ESOPHAGOGASTRODUODENOSCOPY (EGD) WITH PROPOFOL;  Surgeon: Juanita Craver, MD;  Location: [facility] ENDOSCOPY;  Service: Endoscopy;  Laterality: N/A;  . EYE SURGERY  11/01/2012   BOTH EYES CATARACTS  . INSERT / REPLACE / REMOVE PACEMAKER  10/06/10   MDT  implanted by Dr [name]  . KNEE ARTHROSCOPY     both  . LEFT HEART CATH AND CORONARY ANGIOGRAPHY N/A 03/24/2018   Procedure: LEFT HEART CATH AND CORONARY ANGIOGRAPHY;  Surgeon: Lorretta Harp, MD;  Location: [place] CV LAB;  Service: Cardiovascular;  Laterality: N/A;  . OVARY SURGERY     removal  . REPAIR RECTOCELE    . SIMPLE MASTECTOMY WITH AXILLARY SENTINEL NODE BIOPSY Left 01/09/2015   Procedure: Irrigation and Drainage Abcess left Axilla;  Surgeon: Jackolyn Confer, MD;  Location: [facility] ORS;  Service: General;  Laterality: Left;  . TONSILLECTOMY  AGE 63  . TOTAL ABDOMINAL HYSTERECTOMY  1971    Social History  reports that she quit smoking about 2 years ago. Her smoking use included cigarettes. She smoked 0.00 packs per day for 54.00 years. She quit smokeless tobacco use about 8 years ago. She reports previous alcohol use of about 1.0 standard drink of alcohol per week. She reports that she does not use drugs.  Allergies  Allergen Reactions  . Bee Venom Swelling  . Latex Swelling    LATEX CATHETERS    Family History  Problem Relation Age of Onset  . Congestive Heart Failure Mother   . Congestive Heart Failure Father   . Osteoarthritis Sister   . Prostate cancer Brother   . Diabetes Brother   . Anesthesia problems Neg Hx   . Hypotension Neg Hx   . Malignant hyperthermia Neg Hx   . Pseudochol deficiency Neg Hx    Prior to Admission medications   Medication Sig Start Date End Date Taking? Authorizing Provider  albuterol (PROAIR HFA) 108  (90 BASE) MCG/ACT inhaler Inhale 2 puffs into the lungs every 4 (four) hours as needed for wheezing.  07/21/12  Yes [provider]  apixaban (ELIQUIS) 5 MG TABS tablet Take 1 tablet (5 mg total) by mouth 2 (two) times daily. 07/04/19  Yes Satira Sark, MD  atorvastatin (LIPITOR) 40 MG tablet Take 40 mg by mouth at bedtime.  03/17/13  Yes [provider]  budesonide-formoterol (SYMBICORT) 160-4.5 MCG/ACT inhaler Inhale 2 puffs into the lungs 2 (two) times daily.   Yes [provider]  diclofenac Sodium (VOLTAREN) 1 % GEL Apply 2 g topically 2 (two) times daily as needed (for pain).  05/03/20  Yes [provider]  diltiazem (CARDIZEM CD) 120 MG 24 hr capsule Take 1 capsule (120 mg total) by mouth daily. 09/29/19  Yes Satira Sark, MD  DULoxetine (CYMBALTA) 30 MG capsule Take 30 mg by mouth at bedtime.  02/22/20  Yes [provider]  famotidine (PEPCID) 20 MG tablet Take 20 mg by mouth 2 (two) times daily.  09/06/19  Yes [provider]  fenofibrate 160 MG tablet Take 160 mg by mouth every evening.   Yes [provider]  furosemide (LASIX) 20 MG tablet Take 10 mg by mouth daily.    Yes [provider]  ipratropium-albuterol (DUONEB) 0.5-2.5 (3) MG/3ML SOLN Take 3 mLs by nebulization 4 (four) times daily. 08/31/19  Yes Johnson, Clanford L, MD  isosorbide mononitrate (IMDUR) 30 MG 24 hr tablet Take 1 tablet (30 mg total) by mouth 2 (two) times daily. 08/03/19 08/02/20 Yes Satira Sark, MD  LORazepam (ATIVAN) 0.5 MG tablet Take 0.25-0.5 mg by mouth 2 (two) times daily as needed for anxiety.  04/28/13  Yes [provider]  metFORMIN (GLUCOPHAGE) 1000 MG tablet Take 1,000 mg by mouth 2 (two) times daily. 03/05/20  Yes [provider]  metoprolol succinate (TOPROL-XL) 100 MG 24 hr tablet Take 1 tablet by mouth once daily Patient taking differently: Take 100 mg by mouth every morning.  09/13/19  Yes [name], [name],  MD  nitroGLYCERIN (NITROSTAT) 0.4 MG SL tablet DISSOLVE ONE TABLET UNDER THE TONGUE EVERY 5 MINUTES AS NEEDED FOR CHEST PAIN.  DO NOT EXCEED A TOTAL OF 3 DOSES IN 15 MINUTES Patient taking differently: Place 0.4 mg under the tongue every 5 (five) minutes as needed. DISSOLVE ONE TABLET UNDER THE TONGUE EVERY 5 MINUTES AS NEEDED FOR CHEST PAIN.  DO NOT EXCEED A TOTAL OF 3 DOSES IN 15 MINUTES 07/04/19  Yes Satira Sark, MD  OXYGEN Inhale 2 L into the lungs continuous.   Yes [provider]  potassium chloride SA (KLOR-CON) 20 MEQ tablet Take 20 mEq by mouth 2 (two) times daily.   Yes [provider]  Probiotic Product (ALIGN PO) Take 1 capsule by mouth daily.    Yes [provider]  spironolactone (ALDACTONE) 25 MG tablet Take 1 tablet by mouth once daily Patient taking differently: Take 25 mg by mouth daily.  03/05/20  Yes Satira Sark, MD   Physical Exam: Vitals:   05/05/20 1733 05/05/20 1800 05/05/20 1900 05/05/20 1930  BP:  (!) 193/117 (!) 193/86 (!) 196/116  Pulse: 74 74 70 70  Resp: (!) 24 (!) 21 (!) 24 19  Temp:      TempSrc:      SpO2: 94% 93% 91% 98%  Weight:      Height:       Constitutional: NAD, calm, comfortable Eyes: PERRL, lids and conjunctivae normal ENMT: Mucous membranes are moist. Posterior pharynx clear of any exudate or lesions. Neck: normal, supple, no masses, no thyromegaly Respiratory: Mildly tachypneic.  Bibasilar crackles.  No wheezing, no rhonchi.  No accessory muscle use.  Cardiovascular: Regular rate and rhythm, no murmurs / rubs / gallops. No extremity edema. 2+ pedal pulses. No carotid bruits.  Abdomen: Obese, nondistended.  BS positive.  Soft, no tenderness, no masses palpated. No hepatosplenomegaly..  Musculoskeletal: no clubbing / cyanosis.. Good ROM, no contractures. Normal muscle tone.  Skin: no rashes, lesions, ulcers on very limited dermatological examination. Neurologic: CN 2-12 grossly intact. Sensation intact,  DTR normal. Strength 5/5 in all 4.  Psychiatric: Normal judgment and insight. Alert and oriented x 3. Normal mood.   Labs on Admission: I have personally reviewed following labs and imaging studies  CBC: Recent Labs  Lab 05/05/20 1536  WBC 7.5  NEUTROABS 5.8  HGB 10.1*  HCT 33.7*  MCV 84.0  PLT 161   Basic Metabolic Panel: Recent Labs  Lab 05/05/20 1536 05/05/20 1851  NA 138  --  K 4.1  --   CL 102  --   CO2 26  --   GLUCOSE 158*  --   BUN 24*  --   CREATININE 1.09*  --   CALCIUM 9.1  --   MG  --  1.4*  PHOS  --  3.9   GFR: Estimated Creatinine Clearance: 52.1 mL/min (A) (by C-G formula based on SCr of 1.09 mg/dL (H)).  Liver Function Tests: Recent Labs  Lab 05/05/20 1536  AST 39  ALT 31  ALKPHOS 25*  BILITOT 1.1  PROT 6.9  ALBUMIN 3.7   Radiological Exams on Admission: DG Chest Port 1 View  Result Date: 05/05/2020 CLINICAL DATA:  Shortness of breath.  Left shoulder pain. EXAM: PORTABLE CHEST 1 VIEW COMPARISON:  05/03/2020 performed at [facility] FINDINGS: Chronic cardiomegaly. Mild pulmonary vascular congestion, slightly increased since the prior study. No consolidative infiltrates or appreciable effusions. Pacemaker in place. No acute bone abnormality. IMPRESSION: 1. Stable cardiomegaly. 2. Slight pulmonary vascular congestion, slightly increased since the prior study. Electronically Signed   By: Lorriane Shire M.D.   On: 05/05/2020 16:04   07/19/2018 echocardiogram -------------------------------------------------------------------  LV EF: 55% -  60%   -------------------------------------------------------------------  Indications:   Chest pain 786.51.   -------------------------------------------------------------------  History:  PMH: Former Smoker. GERD. Atrial fibrillation.  Chronic obstructive pulmonary disease. Risk factors:  Hypertension. Diabetes mellitus. Dyslipidemia.    -------------------------------------------------------------------  Study Conclusions   - Left ventricle: The cavity size was normal. Wall thickness was  normal. Systolic function was normal. The estimated ejection  fraction was in the range of 55% to 60%. Wall motion was normal;  there were no regional wall motion abnormalities. Indeterminate  diastolic function.  - Aortic valve: Mildly to moderately calcified annulus. Trileaflet.  - Mitral valve: Mildly thickened leaflets. There was mild  regurgitation.  - Right ventricle: Pacer wire or catheter noted in right ventricle.  - Right atrium: Central venous pressure (est): 3 mm Hg.  - Atrial septum: No defect or patent foramen ovale was identified.  - Tricuspid valve: There was trivial regurgitation.  - Pulmonary arteries: Systolic pressure could not be accurately  estimated.  - Pericardium, extracardiac: There was no pericardial effusion.   EKG: Independently reviewed.  Vent. rate 74 BPM PR interval * ms QRS duration 162 ms QT/QTc 445/494 ms P-R-T axes 0 -77 99 Sinus rhythm Ventricular premature complex Short PR interval IVCD, consider atypical RBBB LVH with secondary repolarization abnormality Probable inferior infarct, recent Anterior infarct, old  Assessment/Plan Principal Problem:   Acute on chronic diastolic congestive heart failure (HCC) Observation/telemetry. Continue supplemental oxygen. Fluid restriction. Sodium restriction. Monitor daily weights. Monitor intake and output. Hold beta-blocker until tomorrow. Nitropaste to anterior chest wall. Continue furosemide 40 mg IVP twice daily. Monitor renal function and electrolytes. Replace electrolytes as needed. Check echocardiogram.  Active Problems:   Essential hypertension Hold metoprolol and Cardizem for now. Resume in the morning once symptoms improve. Nitro paste to ACW. Hydralazine 25 mg every 6 hours as needed. Left neck/shoulder pain  control.    COPD exacerbation ([place]) Seems to have improved. Continue supplemental oxygen. Continue bronchodilators as needed. Defer further glucocorticoid.    PAF (paroxysmal atrial fibrillation) (HCC) CHA?DS?-VASc Score of at least 6. Continue apixaban 5 mg p.o. twice daily. Continue diltiazem 120 mg p.o. daily. Continue metoprolol succinate 100 mg p.o. daily.    Coronary artery disease due to lipid rich plaque On apixaban, atorvastatin, fenoibrate and metoprolol.    Hypomagnesemia ,  No muscle cramps, constipation or palpitations. Replacement ordered. Follow-up magnesium level as needed.      Esophageal reflux Protonix 40 mg p.o. daily    Mixed hyperlipidemia Continue atorvastatin 40 mg p.o. daily. Continue fenofibrate 160 mg p.o. daily.    OSA (obstructive sleep apnea)  Continue CPAP at bedtime.    Diabetic neuropathy (HCC) Continue Cymbalta 30 mg p.o. daily at bedtime.    Type 2 diabetes mellitus (HCC) Received Solu-Medrol earlier. Carbohydrate modified diet. Continue metformin 1000 mg p.o. twice daily. CBG monitoring while in the hospital.    Major depression, recurrent, chronic (HCC) Continue duloxetine 30 mg p.o. bedtime. Continue lorazepam as needed for anxiety or insomnia.    Normocytic anemia Monitor H&H.    CKD (chronic kidney disease), stage III Monitor renal function electrolytes.    DVT prophylaxis: On apixaban. Code Status:   Full code. Family Communication: Disposition Plan:   Patient is from:  Home.  Anticipated DC to:  Home.  Anticipated DC date:  05/06/2020.  Anticipated DC barriers: Clinical improvement.  Consults called: Admission status:  Observation/telemetry.  Severity of Illness:  Reubin Milan MD [facility]  How to contact the [facility] Attending or Consulting provider [place] or covering provider during after hours [place], for this patient?   1. Check the care team in [facility] and look for a) attending/consulting [facility]  provider listed and b) the [facility] team listed 2. Log into [email or web address] and use [place]'s universal password to access. If you do not have the password, please contact the hospital operator. 3. Locate the [facility] provider you are looking for under [facility] and page to a number that you can be directly reached. 4. If you still have difficulty reaching the provider, please page the [facility] (Director on Call) for the Hospitalists listed on amion for assistance.  05/05/2020, 7:57 PM   This document was created using Dragon voice recognition software and may contain some unintended transcription errors.

## 2020-05-05 NOTE — ED Triage Notes (Signed)
C/o left shoulder pain radiating to neck, rates pain 6/10 now but does increase to 10/10 at times.  Denies injury to shoulder.  This has been getting increasingly worse for last 2 weeks.  C/o sore throat, started this am.

## 2020-05-05 NOTE — ED Provider Notes (Signed)
[facility] Provider Note   CSN: [id number] Arrival date & time: 05/05/20  1409     History Chief Complaint  Patient presents with  . Shoulder Pain    left    Brandi Hunter is a 79 y.o. female.  Patient complains of shortness of breath left-sided chest pain.  Patient has a history of COPD congestive heart failure  The history is provided by the patient, a significant other and medical records. No language interpreter was used.  Shortness of Breath Severity:  Moderate Onset quality:  Sudden Timing:  Constant Progression:  Waxing and waning Chronicity:  New Context: activity   Relieved by:  Nothing Worsened by:  Nothing Ineffective treatments:  None tried Associated symptoms: no abdominal pain, no chest pain, no cough, no headaches and no rash   Risk factors: no recent alcohol use        Past Medical History:  Diagnosis Date  . Asthma   . CAD (coronary artery disease)    a. catheterization in 03/2018 showing 90% ostial OM stenosis with medical management recommended at that time  . Chronic anticoagulation   . COPD (chronic obstructive pulmonary disease) ([place])   . DJD (degenerative joint disease)   . Essential hypertension   . GERD (gastroesophageal reflux disease)   . History of home oxygen therapy   . Hyperlipidemia   . Mobitz (type) II atrioventricular block   . Obesity   . Pacemaker    Implanted by Dr [name] (MDT) 10/06/10  . PAF (paroxysmal atrial fibrillation) ([place])   . Pancreatitis 2010 OR 2011  . Sleep apnea    CPAP  . Type 2 diabetes mellitus [facility])     Patient Active Problem List   Diagnosis Date Noted  . Acute on chronic diastolic congestive heart failure ([place]) 05/05/2020  . Persistent atrial fibrillation ([place]) 02/20/2020  . Secondary hypercoagulable state ([place]) 02/20/2020  . Acute and chronic respiratory failure (acute-on-chronic) ([place]) 08/30/2019  . COPD exacerbation ([place]) 08/29/2019  . Chest pain 07/18/2018  . Sepsis due  to urinary tract infection ([place]) 06/30/2018  . AKI (acute kidney injury) ([place]) 06/30/2018  . Sepsis, unspecified organism ([place]) 06/30/2018  . Normocytic anemia 06/30/2018  . Coronary artery disease due to lipid rich plaque   . Unstable angina ([place]) 03/23/2018  . Acute respiratory distress 09/26/2017  . Acute exacerbation of chronic obstructive pulmonary disease (COPD) ([place]) 09/26/2017  . 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 Type 2 diabetes mellitus ([place]) 09/26/2017  . Acute on chronic diastolic CHF (congestive heart failure) ([place]) 09/26/2017  . Cardiac pacemaker 11/17/2016  . Eczema 11/17/2016  . Major depression, recurrent, chronic ([place]) 09/17/2016  . Axillary abscess 07/22/2016  . Chronic left shoulder pain 10/01/2015  . Xerosis cutis 10/01/2015  . Paroxysmal atrial fibrillation ([place]) 05/24/2014  . PAF (paroxysmal atrial fibrillation) ([place]) 05/24/2014  . NAFLD (nonalcoholic fatty liver disease) 03/29/2014  . Renal cyst 03/29/2014  . Elevated TSH 03/06/2014  . Type 2 diabetes mellitus without complication, without long-term current use of insulin ([place]) 03/06/2014  . Peripheral neuropathy 04/27/2013  . Vitamin B12 deficiency 04/27/2013  . Abnormality of gait 03/29/2013  . Diabetic neuropathy ([place]) 03/29/2013  . OSA (obstructive sleep apnea) 08/26/2012  . Chronic asthmatic bronchitis ([place]) 08/26/2012  . Preop cardiovascular exam 08/15/2012  . Nocturnal hypoxia 04/08/2012  . Seborrheic dermatitis 10/21/2011  . IBS (irritable bowel syndrome) 05/27/2011  . Esophageal reflux 03/27/2011  . Mixed hyperlipidemia 03/27/2011  . Chronic obstructive pulmonary disease (  [place]) 03/27/2011  . 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 Essential hypertension 02/09/2011  . Obesity 02/09/2011  . Tobacco use disorder 02/05/2011  . Vitamin D deficiency 12/30/2010  . Osteoarthrosis involving lower leg 12/26/2010  . Allergic rhinitis  01/23/2009  . Mixed incontinence 10/08/2008    Past Surgical History:  Procedure Laterality Date  . CARDIOVERSION N/A 08/18/2019   Procedure: CARDIOVERSION;  Surgeon: Lelon Perla, MD;  Location: [facility] ENDOSCOPY;  Service: Cardiovascular;  Laterality: N/A;  . CATARACT EXTRACTION W/PHACO  11/02/2011   Procedure: CATARACT EXTRACTION PHACO AND INTRAOCULAR LENS PLACEMENT ([place]);  Surgeon: Williams Che;  Location: [facility] ORS;  Service: Ophthalmology;  Laterality: Right;  CDE=7.33  . CATARACT EXTRACTION W/PHACO  12/07/2011   Procedure: CATARACT EXTRACTION PHACO AND INTRAOCULAR LENS PLACEMENT (IOC);  Surgeon: Williams Che, MD;  Location: [facility] ORS;  Service: Ophthalmology;  Laterality: Left;  CDE 3.61  . COLONOSCOPY WITH PROPOFOL N/A 08/09/2014   Procedure: COLONOSCOPY WITH PROPOFOL;  Surgeon: Juanita Craver, MD;  Location: [facility] ENDOSCOPY;  Service: Endoscopy;  Laterality: N/A;  . CYSTOCELE REPAIR    . ESOPHAGOGASTRODUODENOSCOPY (EGD) WITH PROPOFOL N/A 08/09/2014   Procedure: ESOPHAGOGASTRODUODENOSCOPY (EGD) WITH PROPOFOL;  Surgeon: Juanita Craver, MD;  Location: [facility] ENDOSCOPY;  Service: Endoscopy;  Laterality: N/A;  . EYE SURGERY  11/01/2012   BOTH EYES CATARACTS  . INSERT / REPLACE / REMOVE PACEMAKER  10/06/10   MDT  implanted by Dr [name]  . KNEE ARTHROSCOPY     both  . LEFT HEART CATH AND CORONARY ANGIOGRAPHY N/A 03/24/2018   Procedure: LEFT HEART CATH AND CORONARY ANGIOGRAPHY;  Surgeon: Lorretta Harp, MD;  Location: [facility] CV LAB;  Service: Cardiovascular;  Laterality: N/A;  . OVARY SURGERY     removal  . REPAIR RECTOCELE    . SIMPLE MASTECTOMY WITH AXILLARY SENTINEL NODE BIOPSY Left 01/09/2015   Procedure: Irrigation and Drainage Abcess left Axilla;  Surgeon: Jackolyn Confer, MD;  Location: [facility] ORS;  Service: General;  Laterality: Left;  . TONSILLECTOMY  AGE 31  . TOTAL ABDOMINAL HYSTERECTOMY  1971     OB History    Gravida  3   Para  3   Term  3   Preterm      AB      Living    3     SAB      TAB      Ectopic      Multiple      Live Births              Family History  Problem Relation Age of Onset  . Congestive Heart Failure Mother   . Congestive Heart Failure Father   . Osteoarthritis Sister   . Prostate cancer Brother   . Diabetes Brother   . Anesthesia problems Neg Hx   . Hypotension Neg Hx   . Malignant hyperthermia Neg Hx   . Pseudochol deficiency Neg Hx     Social History   Tobacco Use  . Smoking status: Former Smoker    Packs/day: 0.00    Years: 54.00    Pack years: 0.00    Types: Cigarettes    Quit date: 09/22/2017    Years since quitting: 2.6  . Smokeless tobacco: Former Systems developer    Quit date: 11/17/2011  Vaping Use  . Vaping Use: Never used  Substance Use Topics  . Alcohol use: Not Currently    Alcohol/week: 1.0 standard drink  Types: 1 Glasses of wine per week    Comment: couple glasses of wine occasionally-once a month  . Drug use: No    Home Medications Prior to Admission medications   Medication Sig Start Date End Date Taking? Authorizing Provider  albuterol (PROAIR HFA) 108 (90 BASE) MCG/ACT inhaler Inhale 2 puffs into the lungs every 4 (four) hours as needed for wheezing.  07/21/12  Yes [provider]  apixaban (ELIQUIS) 5 MG TABS tablet Take 1 tablet (5 mg total) by mouth 2 (two) times daily. 07/04/19  Yes Satira Sark, MD  atorvastatin (LIPITOR) 40 MG tablet Take 40 mg by mouth at bedtime.  03/17/13  Yes [provider]  budesonide-formoterol (SYMBICORT) 160-4.5 MCG/ACT inhaler Inhale 2 puffs into the lungs 2 (two) times daily.   Yes [provider]  diclofenac Sodium (VOLTAREN) 1 % GEL Apply 2 g topically 2 (two) times daily as needed (for pain).  05/03/20  Yes [provider]  diltiazem (CARDIZEM CD) 120 MG 24 hr capsule Take 1 capsule (120 mg total) by mouth daily. 09/29/19  Yes Satira Sark, MD  DULoxetine (CYMBALTA) 30 MG capsule Take 30 mg by mouth at bedtime.   02/22/20  Yes [provider]  famotidine (PEPCID) 20 MG tablet Take 20 mg by mouth 2 (two) times daily.  09/06/19  Yes [provider]  fenofibrate 160 MG tablet Take 160 mg by mouth every evening.   Yes [provider]  furosemide (LASIX) 20 MG tablet Take 10 mg by mouth daily.    Yes [provider]  ipratropium-albuterol (DUONEB) 0.5-2.5 (3) MG/3ML SOLN Take 3 mLs by nebulization 4 (four) times daily. 08/31/19  Yes Johnson, Clanford L, MD  isosorbide mononitrate (IMDUR) 30 MG 24 hr tablet Take 1 tablet (30 mg total) by mouth 2 (two) times daily. 08/03/19 08/02/20 Yes Satira Sark, MD  LORazepam (ATIVAN) 0.5 MG tablet Take 0.25-0.5 mg by mouth 2 (two) times daily as needed for anxiety.  04/28/13  Yes [provider]  metFORMIN (GLUCOPHAGE) 1000 MG tablet Take 1,000 mg by mouth 2 (two) times daily. 03/05/20  Yes [provider]  metoprolol succinate (TOPROL-XL) 100 MG 24 hr tablet Take 1 tablet by mouth once daily Patient taking differently: Take 100 mg by mouth every morning.  09/13/19  Yes [name], [name], MD  nitroGLYCERIN (NITROSTAT) 0.4 MG SL tablet DISSOLVE ONE TABLET UNDER THE TONGUE EVERY 5 MINUTES AS NEEDED FOR CHEST PAIN.  DO NOT EXCEED A TOTAL OF 3 DOSES IN 15 MINUTES Patient taking differently: Place 0.4 mg under the tongue every 5 (five) minutes as needed. DISSOLVE ONE TABLET UNDER THE TONGUE EVERY 5 MINUTES AS NEEDED FOR CHEST PAIN.  DO NOT EXCEED A TOTAL OF 3 DOSES IN 15 MINUTES 07/04/19  Yes Satira Sark, MD  OXYGEN Inhale 2 L into the lungs continuous.   Yes [provider]  potassium chloride SA (KLOR-CON) 20 MEQ tablet Take 20 mEq by mouth 2 (two) times daily.   Yes [provider]  Probiotic Product (ALIGN PO) Take 1 capsule by mouth daily.    Yes [provider]  spironolactone (ALDACTONE) 25 MG tablet Take 1 tablet by mouth once daily Patient taking differently: Take 25 mg by mouth daily.   03/05/20  Yes Satira Sark, MD    Allergies    Bee venom and Latex  Review of Systems   Review of Systems  Constitutional: Negative for appetite change and fatigue.  HENT: Negative for congestion, ear discharge and sinus pressure.   Eyes: Negative for discharge.  Respiratory: Positive for shortness of breath. Negative for cough.   Cardiovascular: Negative for chest pain.  Gastrointestinal: Negative for abdominal pain and diarrhea.  Genitourinary: Negative for frequency and hematuria.  Musculoskeletal: Negative for back pain.  Skin: Negative for rash.  Neurological: Negative for seizures and headaches.  Psychiatric/Behavioral: Negative for hallucinations.    Physical Exam Updated Vital Signs BP (!) 196/116   Pulse 70   Temp 98.9 F (37.2 C) (Oral)   Resp 19   Ht 5\' 6"  (1.676 m)   Wt 102.1 kg   SpO2 98%   BMI 36.32 kg/m   Physical Exam Vitals and nursing note reviewed.  Constitutional:      Appearance: She is well-developed.  HENT:     Head: Normocephalic.     Nose: Nose normal.  Eyes:     General: No scleral icterus.    Conjunctiva/sclera: Conjunctivae normal.  Neck:     Thyroid: No thyromegaly.  Cardiovascular:     Rate and Rhythm: Normal rate and regular rhythm.     Heart sounds: No murmur heard.  No friction rub. No gallop.   Pulmonary:     Breath sounds: No stridor. Wheezing present. No rales.  Chest:     Chest wall: No tenderness.  Abdominal:     General: There is no distension.     Tenderness: There is no abdominal tenderness. There is no rebound.  Musculoskeletal:        General: Normal range of motion.     Cervical back: Neck supple.  Lymphadenopathy:     Cervical: No cervical adenopathy.  Skin:    Findings: No erythema or rash.  Neurological:     Mental Status: She is alert and oriented to person, place, and time.     Motor: No abnormal muscle tone.     Coordination: Coordination normal.  Psychiatric:        Behavior: Behavior normal.      ED Results / Procedures / Treatments   Labs (all labs ordered are listed, but only abnormal results are displayed) Labs Reviewed  CBC WITH DIFFERENTIAL/PLATELET - Abnormal; Notable for the following components:      Result Value   Hemoglobin 10.1 (*)    HCT 33.7 (*)    MCH 25.2 (*)    RDW 18.1 (*)    All other components within normal limits  COMPREHENSIVE METABOLIC PANEL - Abnormal; Notable for the following components:   Glucose, Bld 158 (*)    BUN 24 (*)    Creatinine, Ser 1.09 (*)    Alkaline Phosphatase 25 (*)    GFR calc non Af Amer 49 (*)    GFR calc Af Amer 57 (*)    All other components within normal limits  BRAIN NATRIURETIC PEPTIDE - Abnormal; Notable for the following components:   B Natriuretic Peptide 1,222.0 (*)    All other components within normal limits  MAGNESIUM - Abnormal; Notable for the following components:   Magnesium 1.4 (*)    All other components within normal limits  SARS CORONAVIRUS 2 BY RT PCR (HOSPITAL ORDER, [facility])  D-DIMER, QUANTITATIVE (NOT AT [facility])  PHOSPHORUS  BASIC METABOLIC PANEL  CBC  TROPONIN I (HIGH SENSITIVITY)  TROPONIN I (HIGH SENSITIVITY)    EKG EKG Interpretation  Date/Time:  Sunday May 05 2020 14:38:47 EDT Ventricular Rate:  74 PR Interval:  QRS Duration: 162 QT Interval:  445 QTC Calculation: 494 R Axis:   -77 Text Interpretation: Sinus rhythm Ventricular premature complex Short PR interval IVCD, consider atypical RBBB LVH with secondary repolarization abnormality Probable inferior infarct, recent Anterior infarct, old Confirmed by Milton Ferguson [phone]) on 05/05/2020 3:23:24 PM   Radiology DG Chest Port 1 View  Result Date: 05/05/2020 CLINICAL DATA:  Shortness of breath.  Left shoulder pain. EXAM: PORTABLE CHEST 1 VIEW COMPARISON:  05/03/2020 performed at [facility] FINDINGS: Chronic cardiomegaly. Mild pulmonary vascular congestion, slightly increased since the  prior study. No consolidative infiltrates or appreciable effusions. Pacemaker in place. No acute bone abnormality. IMPRESSION: 1. Stable cardiomegaly. 2. Slight pulmonary vascular congestion, slightly increased since the prior study. Electronically Signed   By: Lorriane Shire M.D.   On: 05/05/2020 16:04    Procedures Procedures (including critical care time)  Medications Ordered in ED Medications  nitroGLYCERIN (NITROGLYN) 2 % ointment 1 inch (1 inch Topical Given 05/05/20 1942)  magnesium sulfate IVPB 2 g 50 mL (2 g Intravenous New Bag/Given 05/05/20 1945)  hydrALAZINE (APRESOLINE) tablet 25 mg (has no administration in time range)  acetaminophen (TYLENOL) tablet 650 mg (has no administration in time range)    Or  acetaminophen (TYLENOL) suppository 650 mg (has no administration in time range)  potassium chloride SA (KLOR-CON) CR tablet 20 mEq (has no administration in time range)  albuterol (VENTOLIN HFA) 108 (90 Base) MCG/ACT inhaler 2 puff (2 puffs Inhalation Given 05/05/20 1554)  methylPREDNISolone sodium succinate (SOLU-MEDROL) 125 mg/2 mL injection 125 mg (125 mg Intravenous Given 05/05/20 1705)  albuterol (VENTOLIN HFA) 108 (90 Base) MCG/ACT inhaler 2 puff (2 puffs Inhalation Given 05/05/20 1646)  furosemide (LASIX) injection 40 mg (40 mg Intravenous Given 05/05/20 1705)  oxyCODONE-acetaminophen (PERCOCET/ROXICET) 5-325 MG per tablet 1 tablet (1 tablet Oral Given 05/05/20 1732)    ED Course  I have reviewed the triage vital signs and the nursing notes.  Pertinent labs & imaging results that were available during my care of the patient were reviewed by me and considered in my medical decision making (see chart for details).    CRITICAL CARE Performed by: Milton Ferguson Total critical care time:40 minutes Critical care time was exclusive of separately billable procedures and treating other patients. Critical care was necessary to treat or prevent imminent or life-threatening  deterioration. Critical care was time spent personally by me on the following activities: development of treatment plan with patient and/or surrogate as well as nursing, discussions with consultants, evaluation of patient's response to treatment, examination of patient, obtaining history from patient or surrogate, ordering and performing treatments and interventions, ordering and review of laboratory studies, ordering and review of radiographic studies, pulse oximetry and re-evaluation of patient's condition.  MDM Rules/Calculators/A&P                         Patient with mild congestive heart failure.  She will be admitted to medicine        This patient presents to the ED for concern of shortness of breath this involves an extensive number of treatment options, and is a complaint that carries with it a high risk of complications and morbidity.  The differential diagnosis includes congestive heart failure pneumonia   Lab Tests:   I Ordered, reviewed, and interpreted labs, which included CBC and chemistries and BNP.  BNP is elevated at 1200 and patient has elevated glucose with mild anemia  Medicines ordered:  I ordered medication Lasix for congestive heart failure  Imaging Studies ordered:   I ordered imaging studies which included chest x-ray and  I independently visualized and interpreted imaging which showed congestive heart failure  Additional history obtained:   Additional history obtained from relative  Previous records obtained and reviewed   Consultations Obtained:   I consulted hospitalist and discussed lab and imaging findings  Reevaluation:  After the interventions stated above, I reevaluated the patient and found improved  Critical Interventions:  .    Final Clinical Impression(s) / ED Diagnoses Final diagnoses:  None    Rx / DC Orders ED Discharge Orders    None       Milton Ferguson, MD 05/05/20 [phone]

## 2020-05-05 NOTE — ED Notes (Signed)
RN called to room due to pt having difficulty breathing. Pt sitting up on side of bed tripoding. Lungs clear on left side. Expiratory wheezing on the right. Dr. Roderic Palau notified. Order given for 2 puffs of Albuterol inhaler.

## 2020-05-06 ENCOUNTER — Ambulatory Visit: Payer: Medicare HMO | Admitting: Cardiology

## 2020-05-06 ENCOUNTER — Observation Stay (HOSPITAL_COMMUNITY): Payer: Medicare HMO

## 2020-05-06 DIAGNOSIS — M5412 Radiculopathy, cervical region: Secondary | ICD-10-CM | POA: Diagnosis present

## 2020-05-06 DIAGNOSIS — E1122 Type 2 diabetes mellitus with diabetic chronic kidney disease: Secondary | ICD-10-CM | POA: Diagnosis present

## 2020-05-06 DIAGNOSIS — M25512 Pain in left shoulder: Secondary | ICD-10-CM | POA: Diagnosis present

## 2020-05-06 DIAGNOSIS — I480 Paroxysmal atrial fibrillation: Secondary | ICD-10-CM | POA: Diagnosis present

## 2020-05-06 DIAGNOSIS — E669 Obesity, unspecified: Secondary | ICD-10-CM | POA: Diagnosis present

## 2020-05-06 DIAGNOSIS — I441 Atrioventricular block, second degree: Secondary | ICD-10-CM | POA: Diagnosis present

## 2020-05-06 DIAGNOSIS — J441 Chronic obstructive pulmonary disease with (acute) exacerbation: Secondary | ICD-10-CM | POA: Diagnosis present

## 2020-05-06 DIAGNOSIS — I2510 Atherosclerotic heart disease of native coronary artery without angina pectoris: Secondary | ICD-10-CM | POA: Diagnosis present

## 2020-05-06 DIAGNOSIS — I5033 Acute on chronic diastolic (congestive) heart failure: Secondary | ICD-10-CM | POA: Diagnosis present

## 2020-05-06 DIAGNOSIS — Z20822 Contact with and (suspected) exposure to covid-19: Secondary | ICD-10-CM | POA: Diagnosis present

## 2020-05-06 DIAGNOSIS — J9621 Acute and chronic respiratory failure with hypoxia: Secondary | ICD-10-CM | POA: Diagnosis present

## 2020-05-06 DIAGNOSIS — Z950 Presence of cardiac pacemaker: Secondary | ICD-10-CM | POA: Diagnosis not present

## 2020-05-06 DIAGNOSIS — F339 Major depressive disorder, recurrent, unspecified: Secondary | ICD-10-CM | POA: Diagnosis present

## 2020-05-06 DIAGNOSIS — I5031 Acute diastolic (congestive) heart failure: Secondary | ICD-10-CM | POA: Diagnosis not present

## 2020-05-06 DIAGNOSIS — Z6836 Body mass index (BMI) 36.0-36.9, adult: Secondary | ICD-10-CM | POA: Diagnosis not present

## 2020-05-06 DIAGNOSIS — I081 Rheumatic disorders of both mitral and tricuspid valves: Secondary | ICD-10-CM | POA: Diagnosis present

## 2020-05-06 DIAGNOSIS — M25519 Pain in unspecified shoulder: Secondary | ICD-10-CM | POA: Diagnosis present

## 2020-05-06 DIAGNOSIS — I272 Pulmonary hypertension, unspecified: Secondary | ICD-10-CM | POA: Diagnosis present

## 2020-05-06 DIAGNOSIS — Z91030 Bee allergy status: Secondary | ICD-10-CM | POA: Diagnosis not present

## 2020-05-06 DIAGNOSIS — I130 Hypertensive heart and chronic kidney disease with heart failure and stage 1 through stage 4 chronic kidney disease, or unspecified chronic kidney disease: Secondary | ICD-10-CM | POA: Diagnosis present

## 2020-05-06 DIAGNOSIS — Z91040 Latex allergy status: Secondary | ICD-10-CM | POA: Diagnosis not present

## 2020-05-06 DIAGNOSIS — E1140 Type 2 diabetes mellitus with diabetic neuropathy, unspecified: Secondary | ICD-10-CM | POA: Diagnosis present

## 2020-05-06 DIAGNOSIS — N1832 Chronic kidney disease, stage 3b: Secondary | ICD-10-CM | POA: Diagnosis present

## 2020-05-06 DIAGNOSIS — E782 Mixed hyperlipidemia: Secondary | ICD-10-CM | POA: Diagnosis present

## 2020-05-06 DIAGNOSIS — D631 Anemia in chronic kidney disease: Secondary | ICD-10-CM | POA: Diagnosis present

## 2020-05-06 LAB — BASIC METABOLIC PANEL
Anion gap: 14 (ref 5–15)
BUN: 27 mg/dL — ABNORMAL HIGH (ref 8–23)
CO2: 25 mmol/L (ref 22–32)
Calcium: 9.1 mg/dL (ref 8.9–10.3)
Chloride: 102 mmol/L (ref 98–111)
Creatinine, Ser: 1.22 mg/dL — ABNORMAL HIGH (ref 0.44–1.00)
GFR calc Af Amer: 49 mL/min — ABNORMAL LOW (ref >60)
GFR calc non Af Amer: 43 mL/min — ABNORMAL LOW (ref >60)
Glucose, Bld: 185 mg/dL — ABNORMAL HIGH (ref 70–99)
Potassium: 4.4 mmol/L (ref 3.5–5.1)
Sodium: 141 mmol/L (ref 135–145)

## 2020-05-06 LAB — CBC
HCT: 35 % — ABNORMAL LOW (ref 36.0–46.0)
Hemoglobin: 10.4 g/dL — ABNORMAL LOW (ref 12.0–15.0)
MCH: 25.2 pg — ABNORMAL LOW (ref 26.0–34.0)
MCHC: 29.7 g/dL — ABNORMAL LOW (ref 30.0–36.0)
MCV: 85 fL (ref 80.0–100.0)
Platelets: 348 10*3/uL (ref 150–400)
RBC: 4.12 MIL/uL (ref 3.87–5.11)
RDW: 18.2 % — ABNORMAL HIGH (ref 11.5–15.5)
WBC: 7.4 10*3/uL (ref 4.0–10.5)
nRBC: 0 % (ref 0.0–0.2)

## 2020-05-06 LAB — CBG MONITORING, ED: Glucose-Capillary: 186 mg/dL — ABNORMAL HIGH (ref 70–99)

## 2020-05-06 LAB — ECHOCARDIOGRAM COMPLETE
Height: 66 in
Weight: 3600 oz

## 2020-05-06 LAB — GLUCOSE, CAPILLARY
Glucose-Capillary: 194 mg/dL — ABNORMAL HIGH (ref 70–99)
Glucose-Capillary: 145 mg/dL — ABNORMAL HIGH (ref 70–99)
Glucose-Capillary: 181 mg/dL — ABNORMAL HIGH (ref 70–99)

## 2020-05-06 MED ORDER — FUROSEMIDE 20 MG PO TABS
20.0000 mg | ORAL_TABLET | Freq: Every day | ORAL | Status: DC
  Filled 2020-05-06: qty 1

## 2020-05-06 MED ORDER — IPRATROPIUM-ALBUTEROL 0.5-2.5 (3) MG/3ML IN SOLN
3.0000 mL | Freq: Four times a day (QID) | RESPIRATORY_TRACT | Status: DC
  Administered 2020-05-06 – 2020-05-07 (×4): 3 mL via RESPIRATORY_TRACT
  Filled 2020-05-06 (×5): qty 3

## 2020-05-06 NOTE — Progress Notes (Signed)
*  PRELIMINARY RESULTS* Echocardiogram 2D Echocardiogram has been performed.  Brandi Hunter 05/06/2020, 9:14 AM

## 2020-05-06 NOTE — Evaluation (Signed)
Physical Therapy Evaluation Patient Details Name: Brandi Hunter MRN: [id number] DOB: 07/29/1941 Today's Date: 05/06/2020   History of Present Illness  Brandi Hunter is a 79 y.o. female with medical history significant of asthma, chronic coagulation, COPD, type 2 diabetes, DJD, GERD, hypertension, hyperlipidemia, history of Mobitz type II heart block, history of pacemaker placement, obesity, paroxysmal atrial fibrillation, history of pancreatitis, sleep apnea on CPAP who is coming to the emergency department with a history of progressively worse left neck/shoulder pain for the past 2 weeks.  She denies any trauma to the area.  She also mentioned waking up with a sore throat this morning.  She has been having dyspnea, wheezing and nonproductive cough since last week.  She states that last night she was unable to sleep because she could not get comfortable when lying down due to dyspnea.  She has lower extremity edema.  She denies fever, chills, rhinorrhea, chest pain, palpitations, dizziness, diaphoresis, abdominal pain, nausea, emesis, diarrhea, constipation, melena or hematochezia.  No dysuria, frequency hematuria.  No polyuria, polydipsia, polyphagia or blurred vision.    Clinical Impression  Patient functioning near baseline for functional mobility and gait, demonstrates unsteady cadence during ambulation requiring frequent use of side rails in hallway, required standing rest break before walking back to room, on 2 LPM O2 with SpO2 at 95% and tolerated sitting up in chair after therapy.  Patient will benefit from continued physical therapy in hospital and recommended venue below to increase strength, balance, endurance for safe ADLs and gait.     Follow Up Recommendations Home health PT;Supervision - Intermittent    Equipment Recommendations  None recommended by PT    Recommendations for Other Services       Precautions / Restrictions Precautions Precautions: Fall Restrictions Weight Bearing  Restrictions: No      Mobility  Bed Mobility Overal bed mobility: Modified Independent                Transfers Overall transfer level: Modified independent                  Ambulation/Gait Ambulation/Gait assistance: Supervision;Min guard Gait Distance (Feet): 60 Feet Assistive device: None;1 person hand held assist Gait Pattern/deviations: Decreased step length - right;Decreased step length - left;Decreased stride length Gait velocity: decreased   General Gait Details: frequent leaning on side rails in hallway, had to take standing rest break leaning against wall before returning back to room  Stairs            Wheelchair Mobility    Modified Rankin (Stroke Patients Only)       Balance Overall balance assessment: Needs assistance Sitting-balance support: Feet supported;No upper extremity supported Sitting balance-Leahy Scale: Good Sitting balance - Comments: seated at EOB   Standing balance support: During functional activity;No upper extremity supported Standing balance-Leahy Scale: Fair Standing balance comment: without AD                             Pertinent Vitals/Pain Pain Assessment: 0-10 Pain Score: 3  Pain Location: left UE, shoulder Pain Descriptors / Indicators: Sore;Discomfort Pain Intervention(s): Limited activity within patient's tolerance;Monitored during session    Home Living Family/patient expects to be discharged to:: Private residence Living Arrangements: Children Available Help at Discharge: Family Type of Home: Mobile home Home Access: Stairs to enter Entrance Stairs-Rails: Left;Right (to wide to reach both) Entrance Stairs-Number of Steps: 5 Home Layout: One level Home Equipment: Kasandra Knudsen -  quad;Walker - 4 wheels;Walker - 2 wheels;Bedside commode;Shower seat Additional Comments: on home O2 2 LPM    Prior Function Level of Independence: Needs assistance   Gait / Transfers Assistance Needed: household  ambulator with frequent leaning on walls and furniture, on constant Home O2 at 2 LPM  ADL's / Homemaking Assistance Needed: assisted by family        Hand Dominance        Extremity/Trunk Assessment   Upper Extremity Assessment Upper Extremity Assessment: Overall WFL for tasks assessed    Lower Extremity Assessment Lower Extremity Assessment: Generalized weakness    Cervical / Trunk Assessment Cervical / Trunk Assessment: Normal  Communication   Communication: No difficulties  Cognition Arousal/Alertness: Awake/alert Behavior During Therapy: WFL for tasks assessed/performed Overall Cognitive Status: Within Functional Limits for tasks assessed                                        General Comments      Exercises     Assessment/Plan    PT Assessment Patient needs continued PT services  PT Problem List Decreased strength;Decreased activity tolerance;Decreased balance;Decreased mobility       PT Treatment Interventions Balance training;Gait training;Stair training;Functional mobility training;Therapeutic activities;Therapeutic exercise;Patient/family education    PT Goals (Current goals can be found in the Care Plan section)  Acute Rehab PT Goals Patient Stated Goal: return home with family to assist PT Goal Formulation: With patient Time For Goal Achievement: 05/09/20 Potential to Achieve Goals: Good    Frequency Min 2X/week   Barriers to discharge        Co-evaluation               AM-PAC PT "6 Clicks" Mobility  Outcome Measure Help needed turning from your back to your side while in a flat bed without using bedrails?: None Help needed moving from lying on your back to sitting on the side of a flat bed without using bedrails?: None Help needed moving to and from a bed to a chair (including a wheelchair)?: A Little Help needed standing up from a chair using your arms (e.g., wheelchair or bedside chair)?: None Help needed to walk  in hospital room?: A Little Help needed climbing 3-5 steps with a railing? : A Little 6 Click Score: 21    End of Session Equipment Utilized During Treatment: Oxygen Activity Tolerance: Patient tolerated treatment well;Patient limited by fatigue Patient left: in chair;with call bell/phone within reach Nurse Communication: Mobility status PT Visit Diagnosis: Other abnormalities of gait and mobility (R26.89);Unsteadiness on feet (R26.81);Muscle weakness (generalized) (M62.81)    Time: 0539-7673 PT Time Calculation (min) (ACUTE ONLY): 24 min   Charges:   PT Evaluation $PT Eval Moderate Complexity: 1 Mod PT Treatments $Therapeutic Activity: 23-37 mins        3:28 PM, 05/06/20 Lonell Grandchild, MPT Physical Therapist with [facility] [phone] office [phone] mobile phone

## 2020-05-06 NOTE — Progress Notes (Signed)
PROGRESS NOTE    Brandi Hunter  JXB:[id number] DOB: 08-11-1941 DOA: 05/05/2020 PCP: [name], FNP      Brief Narrative:  Brandi Hunter is a 79 y.o. F with obesity BMI 36, COPD and dCHF on home O2 2L baseline, HTN, hx Mobitz II s/p PPM, pAF on Eliquis, and OSA on CPAP who presented with about a week of progressive shortness of breath with exertion, nonproductive cough.  Patient's initial presenting complaint was intermittent left tingling shoulder pain for several weeks, not worse with exertion or movement of the arm.  However during her evaluation, she was noted to have hypoxia requiring up to 5 L/min oxygen by nasal cannula, as well as orthopnea and lower extremity edema.  In the ER, BNP >1200, CXR showed congestion.  Started on Lasix and the hospitalist service were asked to evaluate for CHF.      Assessment & Plan:  Acute on chronic hypoxic respiratory failure due to acute on chronic diastolic CHF Echo obtained today shows normal EF, grade II DD and pulmonary HTN.  No valvular abnormalities identified.  I/Os not recorded, symptoms still present, no improvement. -Continue Furosemide 40 mg IV twice a day switch to oral tomorrow -K supplement -Strict I/Os, daily weights, telemetry  -Daily monitoring renal function     Left shoulder pain This was the patient's initial presenting complaint, she felt her symptoms were similar to previous pneumonia.  Here, CXR clear, without pneumonia or pneumothorax.    Normal troponins rule out MI, symptoms are not exertional, doubt this is anginal equivalent.  X-ray rules out bony abnormality.    Remaining differential probably includes cervical radiculopathy vs MSK strain -PCP follow up for PT and further imaging pending outcome of PT   COPD, chronic -Continue ICS/LABA  Paroxysmal atrial fibrillation Rate paced on ECG -Continue metoprolol, diltiazem -Continue apixaban  Coronary artery disease, secondary prevention Hypertension BP  elevated overnight, improved this morning -Continue Imdur, diltiazem, metoprolol, spironolactone -Continue atorvastatin, fenofibrate  Diabetes with diabetic neuropathy Last A1c very well controlled -Continue sliding scale corrections -Continue Metformin -Follow-up hemoglobin A1c  Mood disorder -Continue Cymbalta  Chronic normocytic anemia Due to CKD, stable relative to baseline, no clinical bleeding  CKD stage IIIb Cr at baseline.  GERD -Continue home Protonix  Hypomagnesemia Magnesium supplemented  Obesity BMI 36        Disposition: Status is: Inpatient  Remains inpatient appropriate because:IV treatments appropriate due to intensity of illness or inability to take PO   Dispo: The patient is from: Home              Anticipated d/c is to: TBD              Anticipated d/c date is: 1 day              Patient currently is not medically stable to d/c.       Patient still symptomatic, requires ongoing IV Lasix today, likely home tomorrow.           MDM: The below labs and imaging reports were reviewed and summarized above.  Medication management as above.  This is a severe exacerbation of her chronic illness.   DVT prophylaxis:  apixaban (ELIQUIS) tablet 5 mg  Code Status: Full code Family Communication: Son    Consultants:     Procedures:     Antimicrobials:      Culture data:              Subjective: Patient is still  quite out of breath relative to baseline, swelling improved, but still overloaded on exam/JVP.  No fever, no limited ROM of the shoulder.  No sputum.  Objective: Vitals:   05/06/20 0530 05/06/20 0600 05/06/20 0630 05/06/20 1233  BP: (!) 176/90 (!) 188/85 (!) 151/86 (!) 158/76  Pulse: 72 70 71 76  Resp: (!) 21 18 19 20   Temp:    97.9 F (36.6 C)  TempSrc:    Oral  SpO2: (!) 89% 98% 100% 98%  Weight:    100.6 kg  Height:    5\' 6"  (1.676 m)   No intake or output data in the 24 hours ending 05/06/20  1438 Filed Weights   05/05/20 1418 05/06/20 1233  Weight: 102.1 kg 100.6 kg    Examination: General appearance: Obese adult female, alert and in no acvute distress.  Lying in bed, sitting up. HEENT: Anicteric, conjunctiva pink, lids and lashes normal. No nasal deformity, discharge, epistaxis.  Lips moist, edentulous, OP moist, no oral lesions.  Hearing normal Skin: Warm and dry.No jaundice.  No suspicious rashes or lesions. Cardiac: RRR, nl S1-S2, no murmurs appreciated.  Capillary refill is brisk.  JVP elevated to mid neck.  Trace bilateral LE edema.  Radial pulses 2+ and symmetric. Respiratory: Normal respiratory rate and rhythm.  CTAB without rales or wheezes. Abdomen: Abdomen soft.  No TTP or guarding. No ascites, distension, hepatosplenomegaly.   MSK: No deformities or effusions. Neuro: Awake and alert.  EOMI, moves all extremities. Speech fluent.    Psych: Sensorium intact and responding to questions, attention normal. Affect normal.  Judgment and insight appear normal.    Data Reviewed: I have personally reviewed following labs and imaging studies:  CBC: Recent Labs  Lab 05/05/20 1536 05/06/20 0359  WBC 7.5 7.4  NEUTROABS 5.8  --   HGB 10.1* 10.4*  HCT 33.7* 35.0*  MCV 84.0 85.0  PLT 356 409   Basic Metabolic Panel: Recent Labs  Lab 05/05/20 1536 05/05/20 1851 05/06/20 0359  NA 138  --  141  K 4.1  --  4.4  CL 102  --  102  CO2 26  --  25  GLUCOSE 158*  --  185*  BUN 24*  --  27*  CREATININE 1.09*  --  1.22*  CALCIUM 9.1  --  9.1  MG  --  1.4*  --   PHOS  --  3.9  --    GFR: Estimated Creatinine Clearance: 46.2 mL/min (A) (by C-G formula based on SCr of 1.22 mg/dL (H)). Liver Function Tests: Recent Labs  Lab 05/05/20 1536  AST 39  ALT 31  ALKPHOS 25*  BILITOT 1.1  PROT 6.9  ALBUMIN 3.7   No results for input(s): LIPASE, AMYLASE in the last 168 hours. No results for input(s): AMMONIA in the last 168 hours. Coagulation Profile: Recent Labs  Lab  05/05/20 2030  INR 1.4*   Cardiac Enzymes: No results for input(s): CKTOTAL, CKMB, CKMBINDEX, TROPONINI in the last 168 hours. BNP (last 3 results) No results for input(s): PROBNP in the last 8760 hours. HbA1C: No results for input(s): HGBA1C in the last 72 hours. CBG: Recent Labs  Lab 05/05/20 2148 05/06/20 0958 05/06/20 1231  GLUCAP 173* 186* 194*   Lipid Profile: No results for input(s): CHOL, HDL, LDLCALC, TRIG, CHOLHDL, LDLDIRECT in the last 72 hours. Thyroid Function Tests: No results for input(s): TSH, T4TOTAL, FREET4, T3FREE, THYROIDAB in the last 72 hours. Anemia Panel: No results for input(s): VITAMINB12, FOLATE, FERRITIN,  TIBC, IRON, RETICCTPCT in the last 72 hours. Urine analysis:    Component Value Date/Time   COLORURINE YELLOW 07/18/2018 2014   APPEARANCEUR CLOUDY (A) 07/18/2018 2014   LABSPEC 1.015 07/18/2018 2014   PHURINE 5.0 07/18/2018 2014   GLUCOSEU NEGATIVE 07/18/2018 2014   HGBUR NEGATIVE 07/18/2018 2014   [place] NEGATIVE 07/18/2018 2014   [place] 07/18/2018 2014   PROTEINUR NEGATIVE 07/18/2018 2014   UROBILINOGEN 1.0 05/11/2011 2020   NITRITE NEGATIVE 07/18/2018 2014   LEUKOCYTESUR TRACE (A) 07/18/2018 2014   Sepsis Labs: @LABRCNTIP (procalcitonin:4,lacticacidven:4)  ) Recent Results (from the past 240 hour(s))  SARS Coronavirus 2 by RT PCR (hospital order, performed in [place] hospital lab) Nasopharyngeal Nasopharyngeal Swab     Status: None   Collection Time: 05/05/20  4:46 PM   Specimen: Nasopharyngeal Swab  Result Value Ref Range Status   SARS Coronavirus 2 NEGATIVE NEGATIVE Final    Comment: (NOTE) SARS-CoV-2 target nucleic acids are NOT DETECTED.  The SARS-CoV-2 RNA is generally detectable in upper and lower respiratory specimens during the acute phase of infection. The lowest concentration of SARS-CoV-2 viral copies this assay can detect is 250 copies / mL. A negative result does not preclude SARS-CoV-2  infection and should not be used as the sole basis for treatment or other patient management decisions.  A negative result may occur with improper specimen collection / handling, submission of specimen other than nasopharyngeal swab, presence of viral mutation(s) within the areas targeted by this assay, and inadequate number of viral copies (<250 copies / mL). A negative result must be combined with clinical observations, patient history, and epidemiological information.  Fact Sheet for Patients:   StrictlyIdeas.no  Fact Sheet for Healthcare Providers: BankingDealers.co.za  This test is not yet approved or  cleared by the [place] FDA and has been authorized for detection and/or diagnosis of SARS-CoV-2 by FDA under an Emergency Use Authorization (EUA).  This EUA will remain in effect (meaning this test can be used) for the duration of the COVID-19 declaration under Section 564(b)(1) of the Act, 21 U.S.C. section 360bbb-3(b)(1), unless the authorization is terminated or revoked sooner.  Performed at [facility], [address], [place], [place] [address]          Radiology Studies: [facility] 1 View  Result Date: 05/05/2020 CLINICAL DATA:  Shortness of breath.  Left shoulder pain. EXAM: PORTABLE CHEST 1 VIEW COMPARISON:  05/03/2020 performed at [facility] FINDINGS: Chronic cardiomegaly. Mild pulmonary vascular congestion, slightly increased since the prior study. No consolidative infiltrates or appreciable effusions. Pacemaker in place. No acute bone abnormality. IMPRESSION: 1. Stable cardiomegaly. 2. Slight pulmonary vascular congestion, slightly increased since the prior study. Electronically Signed   By: Lorriane Shire M.D.   On: 05/05/2020 16:04   DG Shoulder Left  Result Date: 05/06/2020 CLINICAL DATA:  Left shoulder pain.  No known injury. EXAM: LEFT SHOULDER - 2+ VIEW COMPARISON:  None. FINDINGS: There is  no evidence of fracture or dislocation. There is no evidence of arthropathy or other focal bone abnormality. Soft tissues are unremarkable. IMPRESSION: Negative exam. Electronically Signed   By: Inge Rise M.D.   On: 05/06/2020 12:27   ECHOCARDIOGRAM COMPLETE  Result Date: 05/06/2020    ECHOCARDIOGRAM REPORT   Patient Name:   CHARLCIE PRISCO Date of Exam: 05/06/2020 Medical Rec #:  [id number]   Height:       66.0 in Accession #:    [id number]  Weight:  225.0 lb Date of Birth:  1941-02-14   BSA:          2.102 m Patient Age:    56 years    BP:           151/86 mmHg Patient Gender: F           HR:           70 bpm. Exam Location:  [facility] Procedure: 2D Echo Indications:    CHF-Acute Diastolic 798.92 / J19.41  History:        Patient has prior history of Echocardiogram examinations, most                 recent 07/19/2018. Pacemaker, COPD, Arrythmias:Atrial                 Fibrillation; Risk Factors:Hypertension, Dyslipidemia, Diabetes                 and Former Smoker. Major depression,.  Sonographer:    Leavy Cella RDCS (AE) Referring Phys: [id number] DAVID MANUEL [place]  1. Left ventricular ejection fraction, by estimation, is 60 to 65%. The left ventricle has normal function. The left ventricle has no regional wall motion abnormalities. There is mild left ventricular hypertrophy. Left ventricular diastolic parameters are consistent with Grade II diastolic dysfunction (pseudonormalization). Elevated left atrial pressure.  2. Right ventricular systolic function is normal. The right ventricular size is normal. There is moderately elevated pulmonary artery systolic pressure.  3. Left atrial size was severely dilated.  4. Right atrial size was severely dilated.  5. The mitral valve is normal in structure. Mild to moderate mitral valve regurgitation. No evidence of mitral stenosis.  6. The aortic valve has an indeterminant number of cusps. Aortic valve regurgitation is not visualized. No aortic  stenosis is present.  7. The inferior vena cava is normal in size with greater than 50% respiratory variability, suggesting right atrial pressure of 3 mmHg. FINDINGS  Left Ventricle: Left ventricular ejection fraction, by estimation, is 60 to 65%. The left ventricle has normal function. The left ventricle has no regional wall motion abnormalities. The left ventricular internal cavity size was normal in size. There is  mild left ventricular hypertrophy. Left ventricular diastolic parameters are consistent with Grade II diastolic dysfunction (pseudonormalization). Elevated left atrial pressure. Right Ventricle: The right ventricular size is normal. No increase in right ventricular wall thickness. Right ventricular systolic function is normal. There is moderately elevated pulmonary artery systolic pressure. The tricuspid regurgitant velocity is 3.04 m/s, and with an assumed right atrial pressure of 10 mmHg, the estimated right ventricular systolic pressure is 81.8 mmHg. Left Atrium: Left atrial size was severely dilated. Right Atrium: Right atrial size was severely dilated. Pericardium: There is no evidence of pericardial effusion. Mitral Valve: The mitral valve is normal in structure. Mild to moderate mitral valve regurgitation. No evidence of mitral valve stenosis. Tricuspid Valve: The tricuspid valve is normal in structure. Tricuspid valve regurgitation is mild . No evidence of tricuspid stenosis. Aortic Valve: The aortic valve has an indeterminant number of cusps. Aortic valve regurgitation is not visualized. No aortic stenosis is present. Aortic valve mean gradient measures 2.3 mmHg. Aortic valve peak gradient measures 4.4 mmHg. Aortic valve area, by VTI measures 2.32 cm. Pulmonic Valve: The pulmonic valve was not well visualized. Pulmonic valve regurgitation is not visualized. No evidence of pulmonic stenosis. Aorta: The aortic root is normal in size and structure. Pulmonary Artery: Indeterminant PASP,  inadequate TR jet.  Venous: The inferior vena cava is normal in size with greater than 50% respiratory variability, suggesting right atrial pressure of 3 mmHg. IAS/Shunts: No atrial level shunt detected by color flow Doppler.  LEFT VENTRICLE PLAX 2D LVIDd:         4.96 cm  Diastology LVIDs:         2.76 cm  LV e' lateral:   6.05 cm/s LV PW:         1.37 cm  LV E/e' lateral: 25.3 LV IVS:        1.12 cm  LV e' medial:    5.78 cm/s LVOT diam:     2.00 cm  LV E/e' medial:  26.5 LV SV:         50 LV SV Index:   24 LVOT Area:     3.14 cm  RIGHT VENTRICLE RV S prime:     10.50 cm/s TAPSE (M-mode): 1.5 cm LEFT ATRIUM              Index       RIGHT ATRIUM           Index LA diam:        5.50 cm  2.62 cm/m  RA Area:     32.50 cm LA Vol (A2C):   126.0 ml 59.93 ml/m RA Volume:   121.00 ml 57.55 ml/m LA Vol (A4C):   123.0 ml 58.50 ml/m LA Biplane Vol: 126.0 ml 59.93 ml/m  AORTIC VALVE AV Area (Vmax):    2.33 cm AV Area (Vmean):   1.98 cm AV Area (VTI):     2.32 cm AV Vmax:           104.72 cm/s AV Vmean:          70.246 cm/s AV VTI:            0.218 m AV Peak Grad:      4.4 mmHg AV Mean Grad:      2.3 mmHg LVOT Vmax:         77.61 cm/s LVOT Vmean:        44.248 cm/s LVOT VTI:          0.160 m LVOT/AV VTI ratio: 0.74  AORTA Ao Root diam: 3.10 cm MITRAL VALVE                 TRICUSPID VALVE MV Area (PHT): 3.53 cm      TR Peak grad:   37.0 mmHg MV Decel Time: 215 msec      TR Vmax:        304.00 cm/s MR Peak grad:    132.2 mmHg MR Mean grad:    87.0 mmHg   SHUNTS MR Vmax:         575.00 cm/s Systemic VTI:  0.16 m MR Vmean:        439.0 cm/s  Systemic Diam: 2.00 cm MR PISA:         2.26 cm MR PISA Eff ROA: 9 mm MR PISA Radius:  0.60 cm MV E velocity: 153.00 cm/s MV A velocity: 71.30 cm/s MV E/A ratio:  2.15 Carlyle Dolly MD Electronically signed by Carlyle Dolly MD Signature Date/Time: 05/06/2020/2:12:09 PM    Final         Scheduled Meds: . apixaban  5 mg Oral BID  . atorvastatin  40 mg Oral QHS  .  diltiazem  120 mg Oral Daily  . DULoxetine  30 mg Oral QHS  . famotidine  20 mg Oral BID  . fenofibrate  160 mg Oral QPM  . furosemide  40 mg Intravenous BID  . insulin aspart  0-15 Units Subcutaneous TID WC  . isosorbide mononitrate  30 mg Oral BID  . metFORMIN  1,000 mg Oral BID  . metoprolol succinate  100 mg Oral q morning - 10a  . mometasone-formoterol  2 puff Inhalation BID  . potassium chloride SA  20 mEq Oral BID  . spironolactone  25 mg Oral Daily   Continuous Infusions:   LOS: 0 days    Time spent: 35 minutes    Edwin Dada, MD [facility] 05/06/2020, 2:38 PM     Please page though [place] or Epic secure chat:  For [employer], Adult nurse

## 2020-05-06 NOTE — Plan of Care (Signed)
°  Problem: Acute Rehab PT Goals(only PT should resolve) Goal: Pt Will Go Supine/Side To Sit Outcome: Progressing Flowsheets (Taken 05/06/2020 1529) Pt will go Supine/Side to Sit:  Independently  with modified independence Goal: Patient Will Transfer Sit To/From Stand Outcome: Progressing Flowsheets (Taken 05/06/2020 1529) Patient will transfer sit to/from stand: with modified independence Goal: Pt Will Transfer Bed To Chair/Chair To Bed Outcome: Progressing Flowsheets (Taken 05/06/2020 1529) Pt will Transfer Bed to Chair/Chair to Bed: with modified independence Goal: Pt Will Ambulate Outcome: Progressing Flowsheets (Taken 05/06/2020 1529) Pt will Ambulate:  100 feet  with modified independence  with supervision  with cane   3:30 PM, 05/06/20 Lonell Grandchild, MPT Physical Therapist with [facility] [phone] office [phone] mobile phone

## 2020-05-06 NOTE — TOC Initial Note (Signed)
Transition of Care [facility]) - Initial/Assessment Note   Patient Details  Name: Brandi Hunter MRN: [id number] Date of Birth: 02/08/1941  Transition of Care [facility]) CM/SW Contact:    Sherie Don, LCSW Phone Number: 05/06/2020, 1:40 PM  Clinical Narrative: Patient is a 79 year old female who is under observation for acute on chronic diastolic congestive heart failure. TOC received consult for CHF screening. Per patient, she resides in her son's mobile home with him. She is currently able to complete her ADLs independently. Patient reported she has a CPAP, nebulizer, shower chair, cane, and O2 concentrator. Patient reported [facility] [place].  CSW completed CHF screening. Per patient, "I try to" follow a heart healthy diet and she limits her salt intake. Patient reported she is not currently restricting her fluids as this time. CSW asked about daily weigh-ins. Patient reported she does not currently weigh herself, but does have the scale at home. CSW asked about medication compliance. Patient reported she takes her Lasix as prescribed unless she will not be at home as she has concerns about possible incontinence. TOC to follow for possible discharge needs.  Expected Discharge Plan: Home/Self Care Barriers to Discharge: Continued Medical Work up  Patient Goals and CMS Choice Patient states their goals for this hospitalization and ongoing recovery are:: Discharge home  Expected Discharge Plan and Services Expected Discharge Plan: Home/Self Care In-house Referral: Clinical Social Work Living arrangements for the past 2 months: Mobile Home  Prior Living Arrangements/Services Living arrangements for the past 2 months: Mobile Home Lives with:: Adult Children Patient language and need for interpreter reviewed:: Yes Do you feel safe going back to the place where you live?: Yes      Need for Family Participation in Patient Care: No (Comment) Care giver support system  in place?: Yes (comment) Gaspar Cola (son)) Current home services: DME (CPAP, nebulizer, shower chair, cane, O2 concentrator) Criminal Activity/Legal Involvement Pertinent to Current Situation/Hospitalization: No - Comment as needed  Activities of Daily Living Home Assistive Devices/Equipment: Oxygen, CPAP, Nebulizer, Walker (specify type), Shower chair without back ADL Screening (condition at time of admission) Patient's cognitive ability adequate to safely complete daily activities?: Yes Is the patient deaf or have difficulty hearing?: No Does the patient have difficulty seeing, even when wearing glasses/contacts?: No Does the patient have difficulty concentrating, remembering, or making decisions?: No Patient able to express need for assistance with ADLs?: Yes Does the patient have difficulty dressing or bathing?: No Independently performs ADLs?: Yes (appropriate for developmental age) Does the patient have difficulty walking or climbing stairs?: No Weakness of Legs: None Weakness of Arms/Hands: None  Emotional Assessment Appearance:: Appears stated age Attitude/Demeanor/Rapport: Engaged Affect (typically observed): Accepting Orientation: : Oriented to Self, Oriented to Place, Oriented to  Time, Oriented to Situation Alcohol / Substance Use: Not Applicable Psych Involvement: No (comment)  Admission diagnosis:  Shoulder pain [M25.519] Acute on chronic diastolic congestive heart failure (HCC) [G38.75] Systolic congestive heart failure, unspecified HF chronicity ([place]) [I50.20] Patient Active Problem List   Diagnosis Date Noted  . Acute on chronic diastolic congestive heart failure ([place]) 05/05/2020  . Hypomagnesemia 05/05/2020  . CKD (chronic kidney disease), stage III 05/05/2020  . Persistent atrial fibrillation ([place]) 02/20/2020  . Secondary hypercoagulable state ([place]) 02/20/2020  . Acute and chronic respiratory failure (acute-on-chronic) ([place]) 08/30/2019  . COPD exacerbation  ([place]) 08/29/2019  . Chest pain 07/18/2018  . Sepsis due to urinary tract infection ([place]) 06/30/2018  . AKI (  acute kidney injury) ([place]) 06/30/2018  . Sepsis, unspecified organism ([place]) 06/30/2018  . Normocytic anemia 06/30/2018  . Coronary artery disease due to lipid rich plaque   . Unstable angina ([place]) 03/23/2018  . Acute respiratory distress 09/26/2017  . Acute exacerbation of chronic obstructive pulmonary disease (COPD) ([place]) 09/26/2017  . 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 Type 2 diabetes mellitus ([place]) 09/26/2017  . Acute on chronic diastolic CHF (congestive heart failure) ([place]) 09/26/2017  . Cardiac pacemaker 11/17/2016  . Eczema 11/17/2016  . Major depression, recurrent, chronic ([place]) 09/17/2016  . Axillary abscess 07/22/2016  . Chronic left shoulder pain 10/01/2015  . Xerosis cutis 10/01/2015  . Paroxysmal atrial fibrillation ([place]) 05/24/2014  . PAF (paroxysmal atrial fibrillation) ([place]) 05/24/2014  . NAFLD (nonalcoholic fatty liver disease) 03/29/2014  . Renal cyst 03/29/2014  . Elevated TSH 03/06/2014  . Type 2 diabetes mellitus without complication, without long-term current use of insulin ([place]) 03/06/2014  . Peripheral neuropathy 04/27/2013  . Vitamin B12 deficiency 04/27/2013  . Abnormality of gait 03/29/2013  . Diabetic neuropathy ([place]) 03/29/2013  . OSA (obstructive sleep apnea) 08/26/2012  . Chronic asthmatic bronchitis ([place]) 08/26/2012  . Preop cardiovascular exam 08/15/2012  . Nocturnal hypoxia 04/08/2012  . Seborrheic dermatitis 10/21/2011  . IBS (irritable bowel syndrome) 05/27/2011  . Esophageal reflux 03/27/2011  . Mixed hyperlipidemia 03/27/2011  . Chronic obstructive pulmonary disease ([place]) 03/27/2011  . 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 Essential hypertension 02/09/2011  . Obesity 02/09/2011  . Tobacco use disorder 02/05/2011  . Vitamin D deficiency 12/30/2010  . Osteoarthrosis  involving lower leg 12/26/2010  . Allergic rhinitis 01/23/2009  . Mixed incontinence 10/08/2008   PCP:  [name], [place] Pharmacy:   [facility] [address], [place]  [address] [place] [address] Phone: [phone] Fax: [phone]  Readmission Risk Interventions No flowsheet data found.

## 2020-05-07 LAB — BASIC METABOLIC PANEL
Anion gap: 10 (ref 5–15)
BUN: 48 mg/dL — ABNORMAL HIGH (ref 8–23)
CO2: 28 mmol/L (ref 22–32)
Calcium: 8.8 mg/dL — ABNORMAL LOW (ref 8.9–10.3)
Chloride: 100 mmol/L (ref 98–111)
Creatinine, Ser: 1.42 mg/dL — ABNORMAL HIGH (ref 0.44–1.00)
GFR calc Af Amer: 41 mL/min — ABNORMAL LOW (ref >60)
GFR calc non Af Amer: 36 mL/min — ABNORMAL LOW (ref >60)
Glucose, Bld: 154 mg/dL — ABNORMAL HIGH (ref 70–99)
Potassium: 4.5 mmol/L (ref 3.5–5.1)
Sodium: 138 mmol/L (ref 135–145)

## 2020-05-07 LAB — GLUCOSE, CAPILLARY
Glucose-Capillary: 125 mg/dL — ABNORMAL HIGH (ref 70–99)
Glucose-Capillary: 146 mg/dL — ABNORMAL HIGH (ref 70–99)

## 2020-05-07 LAB — HEMOGLOBIN A1C
Hgb A1c MFr Bld: 5.9 % — ABNORMAL HIGH (ref 4.8–5.6)
Mean Plasma Glucose: 123 mg/dL

## 2020-05-07 LAB — CBC
HCT: 31.1 % — ABNORMAL LOW (ref 36.0–46.0)
Hemoglobin: 9.3 g/dL — ABNORMAL LOW (ref 12.0–15.0)
MCH: 25.5 pg — ABNORMAL LOW (ref 26.0–34.0)
MCHC: 29.9 g/dL — ABNORMAL LOW (ref 30.0–36.0)
MCV: 85.2 fL (ref 80.0–100.0)
Platelets: 352 10*3/uL (ref 150–400)
RBC: 3.65 MIL/uL — ABNORMAL LOW (ref 3.87–5.11)
RDW: 18.6 % — ABNORMAL HIGH (ref 11.5–15.5)
WBC: 8.6 10*3/uL (ref 4.0–10.5)
nRBC: 0 % (ref 0.0–0.2)

## 2020-05-07 MED ORDER — HYDROCODONE-ACETAMINOPHEN 5-325 MG PO TABS: 1.0000 | ORAL_TABLET | Freq: Three times a day (TID) | ORAL | 0 refills | Status: AC | PRN

## 2020-05-07 NOTE — Telephone Encounter (Signed)
Pt went to ED on Sunday 05/05/20, to be discharged today

## 2020-05-07 NOTE — Discharge Summary (Signed)
Physician Discharge Summary  Amellia Panik Lewing YBO:[id number] DOB: 1941-08-05 DOA: 05/05/2020  PCP: [name], FNP  Admit date: 05/05/2020 Discharge date: 05/07/2020  Admitted From: Home  Disposition:  Home with [facility]   Recommendations for Outpatient Follow-up:  1. Follow up with PCP Nonda Lou in 1 week 2. Ms Ouida Sills: Please obtain BMP on Thursday Jun 24 to follow Cr and restart Lasix if stable or improving 3. Ms Ouida Sills: Please follow up shoulder pain after PT     Home Health: PT due to ongoing SOB with exertion  Equipment/Devices:   Discharge Condition: Good  CODE STATUS: FULL Diet recommendation: Cardiac diabetic  Brief/Interim Summary: Mrs. Belflower is a 79 y.o. F with obesity BMI 36, COPD and dCHF on home O2 2L baseline, HTN, hx Mobitz II s/p PPM, pAF on Eliquis, and OSA on CPAP who presented with about a week of progressive shortness of breath with exertion, nonproductive cough.  Patient's initial presenting complaint was intermittent left tingling shoulder pain for several weeks, not worse with exertion or movement of the arm.  However during her evaluation, she was noted to have hypoxia requiring up to 5 L/min oxygen by nasal cannula, as well as orthopnea and lower extremity edema.  In the ER, BNP >1200, CXR showed congestion.  Started on Lasix and the hospitalist service were asked to evaluate for CHF.         PRINCIPAL HOSPITAL DIAGNOSIS: Acute on chronic hypoxic respiratory failure due to CHF flare    Discharge Diagnoses:  Acute on chronic hypoxic respiratory failure due to acute on chronic diastolic CHF Patient admitted and started on IV Lasix.  Echo obtained here showed normal EF, grade II DD and pulmonary HTN.  No valvular abnormalities identified.  Cr bumped to 1.4 on day of discharge, Lasix held.  Repeat BMP in 2 days.  Follow up with PCP next week.    Left shoulder pain This was the patient's initial presenting complaint, she felt her symptoms  were similar to previous pneumonia.  Here, CXR clear, without pneumonia or pneumothorax.    Normal troponins rule out MI, symptoms are not exertional, doubt this is anginal equivalent.  X-ray rules out bony abnormality.    Remaining differential probably includes cervical radiculopathy vs MSK strain -PCP follow up for PT and further imaging pending outcome of PT   COPD, chronic No wheezing on exam.  Paroxysmal atrial fibrillation Continue metoprolol, diltiazem, apixaban   Coronary artery disease, secondary prevention Hypertension Continue Imdur, diltiazem, metoprolol, spironolactone, atorvastatin, fenofibrate  Diabetes with diabetic neuropathy Last A1c very well controlled  Mood disorder Continue Cymbalta  Chronic normocytic anemia Due to CKD, stable relative to baseline, no clinical bleeding  CKD stage IIIb Avoid NSAIDS  Hypomagnesemia  Obesity BMI 36              Discharge Instructions  Discharge Instructions    Diet - low sodium heart healthy   Complete by: As directed    Discharge instructions   Complete by: As directed    From Dr. Loleta Books: You were admitted for trouble breathing due to a flare of congestive heart You were treated with IV diuretics  You should WAIT before restarting your furosemide/Lasix Do not take Lasix for the next 2-3 days Have your primary care check your labs on Thursday and ask them on Friday if you should resume your Lasix  Resume your other home medicines   Shoulder pain: Work with physical therapy on stretching strengthening exercises Take hydrocodone 5 mg  as needed, but limit use as much as possible, and avoid use at night.  Do not use with alcohol or other sedatives such as Ativan Avoid NSAID use You may alternate the hydrocodone-acetaminophen/Vicodin/Norco with plain acetaminophen 1000 mg (2 over the counter tablets) up to twice daily (in other words, for a TOTAL daily dose no greater than 3000 mg)    Increase activity slowly   Complete by: As directed      Allergies as of 05/07/2020      Reactions   Bee Venom Swelling   Latex Swelling   LATEX CATHETERS      Medication List    STOP taking these medications   LORazepam 0.5 MG tablet Commonly known as: ATIVAN     TAKE these medications   ALIGN PO Take 1 capsule by mouth daily.   apixaban 5 MG Tabs tablet Commonly known as: Eliquis Take 1 tablet (5 mg total) by mouth 2 (two) times daily.   atorvastatin 40 MG tablet Commonly known as: LIPITOR Take 40 mg by mouth at bedtime.   budesonide-formoterol 160-4.5 MCG/ACT inhaler Commonly known as: SYMBICORT Inhale 2 puffs into the lungs 2 (two) times daily.   diclofenac Sodium 1 % Gel Commonly known as: VOLTAREN Apply 2 g topically 2 (two) times daily as needed (for pain).   diltiazem 120 MG 24 hr capsule Commonly known as: Cardizem CD Take 1 capsule (120 mg total) by mouth daily.   DULoxetine 30 MG capsule Commonly known as: CYMBALTA Take 30 mg by mouth at bedtime.   famotidine 20 MG tablet Commonly known as: PEPCID Take 20 mg by mouth 2 (two) times daily.   fenofibrate 160 MG tablet Take 160 mg by mouth every evening.   furosemide 20 MG tablet Commonly known as: LASIX Take 10 mg by mouth daily.   HYDROcodone-acetaminophen 5-325 MG tablet Commonly known as: NORCO/VICODIN Take 1 tablet by mouth every 8 (eight) hours as needed for up to 5 days for severe pain.   ipratropium-albuterol 0.5-2.5 (3) MG/3ML Soln Commonly known as: DUONEB Take 3 mLs by nebulization 4 (four) times daily.   isosorbide mononitrate 30 MG 24 hr tablet Commonly known as: IMDUR Take 1 tablet (30 mg total) by mouth 2 (two) times daily.   metFORMIN 1000 MG tablet Commonly known as: GLUCOPHAGE Take 1,000 mg by mouth 2 (two) times daily.   metoprolol succinate 100 MG 24 hr tablet Commonly known as: TOPROL-XL Take 1 tablet by mouth once daily What changed: when to take this    nitroGLYCERIN 0.4 MG SL tablet Commonly known as: NITROSTAT DISSOLVE ONE TABLET UNDER THE TONGUE EVERY 5 MINUTES AS NEEDED FOR CHEST PAIN.  DO NOT EXCEED A TOTAL OF 3 DOSES IN 15 MINUTES What changed:   how much to take  how to take this  when to take this  reasons to take this   OXYGEN Inhale 2 L into the lungs continuous.   potassium chloride SA 20 MEQ tablet Commonly known as: KLOR-CON Take 20 mEq by mouth 2 (two) times daily.   ProAir HFA 108 (90 Base) MCG/ACT inhaler Generic drug: albuterol Inhale 2 puffs into the lungs every 4 (four) hours as needed for wheezing.   spironolactone 25 MG tablet Commonly known as: ALDACTONE Take 1 tablet by mouth once daily       Allergies  Allergen Reactions  . Bee Venom Swelling  . Latex Swelling    LATEX CATHETERS    Consultations:     Procedures/Studies: DG  Chest Port 1 View  Result Date: 05/05/2020 CLINICAL DATA:  Shortness of breath.  Left shoulder pain. EXAM: PORTABLE CHEST 1 VIEW COMPARISON:  05/03/2020 performed at [facility] FINDINGS: Chronic cardiomegaly. Mild pulmonary vascular congestion, slightly increased since the prior study. No consolidative infiltrates or appreciable effusions. Pacemaker in place. No acute bone abnormality. IMPRESSION: 1. Stable cardiomegaly. 2. Slight pulmonary vascular congestion, slightly increased since the prior study. Electronically Signed   By: Lorriane Shire M.D.   On: 05/05/2020 16:04   DG Shoulder Left  Result Date: 05/06/2020 CLINICAL DATA:  Left shoulder pain.  No known injury. EXAM: LEFT SHOULDER - 2+ VIEW COMPARISON:  None. FINDINGS: There is no evidence of fracture or dislocation. There is no evidence of arthropathy or other focal bone abnormality. Soft tissues are unremarkable. IMPRESSION: Negative exam. Electronically Signed   By: Inge Rise M.D.   On: 05/06/2020 12:27   ECHOCARDIOGRAM COMPLETE  Result Date: 05/06/2020    ECHOCARDIOGRAM REPORT    Patient Name:   ROWENE SUTO Date of Exam: 05/06/2020 Medical Rec #:  [id number]   Height:       66.0 in Accession #:    [id number]  Weight:       225.0 lb Date of Birth:  1941/02/13   BSA:          2.102 m Patient Age:    79 years    BP:           151/86 mmHg Patient Gender: F           HR:           70 bpm. Exam Location:  [facility] Procedure: 2D Echo Indications:    CHF-Acute Diastolic 629.52 / W41.32  History:        Patient has prior history of Echocardiogram examinations, most                 recent 07/19/2018. Pacemaker, COPD, Arrythmias:Atrial                 Fibrillation; Risk Factors:Hypertension, Dyslipidemia, Diabetes                 and Former Smoker. Major depression,.  Sonographer:    Leavy Cella RDCS (AE) Referring Phys: [id number] DAVID MANUEL [place]  1. Left ventricular ejection fraction, by estimation, is 60 to 65%. The left ventricle has normal function. The left ventricle has no regional wall motion abnormalities. There is mild left ventricular hypertrophy. Left ventricular diastolic parameters are consistent with Grade II diastolic dysfunction (pseudonormalization). Elevated left atrial pressure.  2. Right ventricular systolic function is normal. The right ventricular size is normal. There is moderately elevated pulmonary artery systolic pressure.  3. Left atrial size was severely dilated.  4. Right atrial size was severely dilated.  5. The mitral valve is normal in structure. Mild to moderate mitral valve regurgitation. No evidence of mitral stenosis.  6. The aortic valve has an indeterminant number of cusps. Aortic valve regurgitation is not visualized. No aortic stenosis is present.  7. The inferior vena cava is normal in size with greater than 50% respiratory variability, suggesting right atrial pressure of 3 mmHg. FINDINGS  Left Ventricle: Left ventricular ejection fraction, by estimation, is 60 to 65%. The left ventricle has normal function. The left ventricle has no regional wall  motion abnormalities. The left ventricular internal cavity size was normal in size. There is  mild left ventricular hypertrophy. Left ventricular diastolic parameters are  consistent with Grade II diastolic dysfunction (pseudonormalization). Elevated left atrial pressure. Right Ventricle: The right ventricular size is normal. No increase in right ventricular wall thickness. Right ventricular systolic function is normal. There is moderately elevated pulmonary artery systolic pressure. The tricuspid regurgitant velocity is 3.04 m/s, and with an assumed right atrial pressure of 10 mmHg, the estimated right ventricular systolic pressure is 51.0 mmHg. Left Atrium: Left atrial size was severely dilated. Right Atrium: Right atrial size was severely dilated. Pericardium: There is no evidence of pericardial effusion. Mitral Valve: The mitral valve is normal in structure. Mild to moderate mitral valve regurgitation. No evidence of mitral valve stenosis. Tricuspid Valve: The tricuspid valve is normal in structure. Tricuspid valve regurgitation is mild . No evidence of tricuspid stenosis. Aortic Valve: The aortic valve has an indeterminant number of cusps. Aortic valve regurgitation is not visualized. No aortic stenosis is present. Aortic valve mean gradient measures 2.3 mmHg. Aortic valve peak gradient measures 4.4 mmHg. Aortic valve area, by VTI measures 2.32 cm. Pulmonic Valve: The pulmonic valve was not well visualized. Pulmonic valve regurgitation is not visualized. No evidence of pulmonic stenosis. Aorta: The aortic root is normal in size and structure. Pulmonary Artery: Indeterminant PASP, inadequate TR jet. Venous: The inferior vena cava is normal in size with greater than 50% respiratory variability, suggesting right atrial pressure of 3 mmHg. IAS/Shunts: No atrial level shunt detected by color flow Doppler.  LEFT VENTRICLE PLAX 2D LVIDd:         4.96 cm  Diastology LVIDs:         2.76 cm  LV e' lateral:   6.05 cm/s LV  PW:         1.37 cm  LV E/e' lateral: 25.3 LV IVS:        1.12 cm  LV e' medial:    5.78 cm/s LVOT diam:     2.00 cm  LV E/e' medial:  26.5 LV SV:         50 LV SV Index:   24 LVOT Area:     3.14 cm  RIGHT VENTRICLE RV S prime:     10.50 cm/s TAPSE (M-mode): 1.5 cm LEFT ATRIUM              Index       RIGHT ATRIUM           Index LA diam:        5.50 cm  2.62 cm/m  RA Area:     32.50 cm LA Vol (A2C):   126.0 ml 59.93 ml/m RA Volume:   121.00 ml 57.55 ml/m LA Vol (A4C):   123.0 ml 58.50 ml/m LA Biplane Vol: 126.0 ml 59.93 ml/m  AORTIC VALVE AV Area (Vmax):    2.33 cm AV Area (Vmean):   1.98 cm AV Area (VTI):     2.32 cm AV Vmax:           104.72 cm/s AV Vmean:          70.246 cm/s AV VTI:            0.218 m AV Peak Grad:      4.4 mmHg AV Mean Grad:      2.3 mmHg LVOT Vmax:         77.61 cm/s LVOT Vmean:        44.248 cm/s LVOT VTI:          0.160 m LVOT/AV VTI ratio: 0.74  AORTA Ao Root diam: 3.10  cm MITRAL VALVE                 TRICUSPID VALVE MV Area (PHT): 3.53 cm      TR Peak grad:   37.0 mmHg MV Decel Time: 215 msec      TR Vmax:        304.00 cm/s MR Peak grad:    132.2 mmHg MR Mean grad:    87.0 mmHg   SHUNTS MR Vmax:         575.00 cm/s Systemic VTI:  0.16 m MR Vmean:        439.0 cm/s  Systemic Diam: 2.00 cm MR PISA:         2.26 cm MR PISA Eff ROA: 9 mm MR PISA Radius:  0.60 cm MV E velocity: 153.00 cm/s MV A velocity: 71.30 cm/s MV E/A ratio:  2.15 Carlyle Dolly MD Electronically signed by Carlyle Dolly MD Signature Date/Time: 05/06/2020/2:12:09 PM    Final        Subjective: Patient feels her breathing is at baseline.  No fever overnight, mild sore throat.  No confusion, cough, swelling.  Discharge Exam: Vitals:   05/07/20 0211 05/07/20 0456  BP: (!) 145/72 (!) 144/76  Pulse: 73 71  Resp: 20 20  Temp: 98 F (36.7 C) (!) 97.5 F (36.4 C)  SpO2: 98% 98%   Vitals:   05/06/20 2220 05/07/20 0211 05/07/20 0456 05/07/20 0700  BP: 135/60 (!) 145/72 (!) 144/76   Pulse: 71 73  71   Resp: 20 20 20    Temp:  98 F (36.7 C) (!) 97.5 F (36.4 C)   TempSrc:  Oral Oral   SpO2: 93% 98% 98%   Weight:    100.3 kg  Height:        General: Pt is alert, awake, not in acute distress, sitting up in recliner, eating breakfast Cardiovascular: RRR, nl S1-S2, no murmurs appreciated.   No LE edema.   Respiratory: Normal respiratory rate and rhythm.  CTAB without rales or wheezes. Abdominal: Abdomen soft and non-tender.  No distension or HSM.   Neuro/Psych: Strength symmetric in upper and lower extremities.  Judgment and insight appear normal.   The results of significant diagnostics from this hospitalization (including imaging, microbiology, ancillary and laboratory) are listed below for reference.     Microbiology: Recent Results (from the past 240 hour(s))  SARS Coronavirus 2 by RT PCR (hospital order, performed in [facility] hospital lab) Nasopharyngeal Nasopharyngeal Swab     Status: None   Collection Time: 05/05/20  4:46 PM   Specimen: Nasopharyngeal Swab  Result Value Ref Range Status   SARS Coronavirus 2 NEGATIVE NEGATIVE Final    Comment: (NOTE) SARS-CoV-2 target nucleic acids are NOT DETECTED.  The SARS-CoV-2 RNA is generally detectable in upper and lower respiratory specimens during the acute phase of infection. The lowest concentration of SARS-CoV-2 viral copies this assay can detect is 250 copies / mL. A negative result does not preclude SARS-CoV-2 infection and should not be used as the sole basis for treatment or other patient management decisions.  A negative result may occur with improper specimen collection / handling, submission of specimen other than nasopharyngeal swab, presence of viral mutation(s) within the areas targeted by this assay, and inadequate number of viral copies (<250 copies / mL). A negative result must be combined with clinical observations, patient history, and epidemiological information.  Fact Sheet for Patients:    StrictlyIdeas.no  Fact Sheet for Healthcare Providers: BankingDealers.co.za  This test is not yet approved or  cleared by the [place] and has been authorized for detection and/or diagnosis of SARS-CoV-2 by FDA under an Emergency Use Authorization (EUA).  This EUA will remain in effect (meaning this test can be used) for the duration of the COVID-19 declaration under Section 564(b)(1) of the Act, 21 U.S.C. section 360bbb-3(b)(1), unless the authorization is terminated or revoked sooner.  Performed at [facility], [address], [place], [place] [address]      Labs: BNP (last 3 results) Recent Labs    08/29/19 1833 05/05/20 1536  BNP 298.0* 0,940.7*   Basic Metabolic Panel: Recent Labs  Lab 05/05/20 1536 05/05/20 1851 05/06/20 0359 05/07/20 0637  NA 138  --  141 138  K 4.1  --  4.4 4.5  CL 102  --  102 100  CO2 26  --  25 28  GLUCOSE 158*  --  185* 154*  BUN 24*  --  27* 48*  CREATININE 1.09*  --  1.22* 1.42*  CALCIUM 9.1  --  9.1 8.8*  MG  --  1.4*  --   --   PHOS  --  3.9  --   --    Liver Function Tests: Recent Labs  Lab 05/05/20 1536  AST 39  ALT 31  ALKPHOS 25*  BILITOT 1.1  PROT 6.9  ALBUMIN 3.7   No results for input(s): LIPASE, AMYLASE in the last 168 hours. No results for input(s): AMMONIA in the last 168 hours. CBC: Recent Labs  Lab 05/05/20 1536 05/06/20 0359 05/07/20 0637  WBC 7.5 7.4 8.6  NEUTROABS 5.8  --   --   HGB 10.1* 10.4* 9.3*  HCT 33.7* 35.0* 31.1*  MCV 84.0 85.0 85.2  PLT 356 348 352   Cardiac Enzymes: No results for input(s): CKTOTAL, CKMB, CKMBINDEX, TROPONINI in the last 168 hours. BNP: Invalid input(s): POCBNP CBG: Recent Labs  Lab 05/06/20 0958 05/06/20 1231 05/06/20 1652 05/06/20 2311 05/07/20 0746  GLUCAP 186* 194* 145* 181* 125*   D-Dimer Recent Labs    05/05/20 1536  DDIMER 0.34   Hgb A1c Recent Labs    05/06/20 0359  HGBA1C 5.9*    Lipid Profile No results for input(s): CHOL, HDL, LDLCALC, TRIG, CHOLHDL, LDLDIRECT in the last 72 hours. Thyroid function studies No results for input(s): TSH, T4TOTAL, T3FREE, THYROIDAB in the last 72 hours.  Invalid input(s): FREET3 Anemia work up No results for input(s): VITAMINB12, FOLATE, FERRITIN, TIBC, IRON, RETICCTPCT in the last 72 hours. Urinalysis    Component Value Date/Time   COLORURINE YELLOW 07/18/2018 2014   APPEARANCEUR CLOUDY (A) 07/18/2018 2014   LABSPEC 1.015 07/18/2018 2014   PHURINE 5.0 07/18/2018 2014   GLUCOSEU NEGATIVE 07/18/2018 2014   HGBUR NEGATIVE 07/18/2018 2014   [place] NEGATIVE 07/18/2018 2014   [place] 07/18/2018 2014   PROTEINUR NEGATIVE 07/18/2018 2014   UROBILINOGEN 1.0 05/11/2011 2020   NITRITE NEGATIVE 07/18/2018 2014   LEUKOCYTESUR TRACE (A) 07/18/2018 2014   Sepsis Labs Invalid input(s): PROCALCITONIN,  WBC,  LACTICIDVEN Microbiology Recent Results (from the past 240 hour(s))  SARS Coronavirus 2 by RT PCR (hospital order, performed in [place] hospital lab) Nasopharyngeal Nasopharyngeal Swab     Status: None   Collection Time: 05/05/20  4:46 PM   Specimen: Nasopharyngeal Swab  Result Value Ref Range Status   SARS Coronavirus 2 NEGATIVE NEGATIVE Final    Comment: (NOTE) SARS-CoV-2 target nucleic acids are NOT DETECTED.  The  SARS-CoV-2 RNA is generally detectable in upper and lower respiratory specimens during the acute phase of infection. The lowest concentration of SARS-CoV-2 viral copies this assay can detect is 250 copies / mL. A negative result does not preclude SARS-CoV-2 infection and should not be used as the sole basis for treatment or other patient management decisions.  A negative result may occur with improper specimen collection / handling, submission of specimen other than nasopharyngeal swab, presence of viral mutation(s) within the areas targeted by this assay, and inadequate number of viral  copies (<250 copies / mL). A negative result must be combined with clinical observations, patient history, and epidemiological information.  Fact Sheet for Patients:   StrictlyIdeas.no  Fact Sheet for Healthcare Providers: BankingDealers.co.za  This test is not yet approved or  cleared by the [place] FDA and has been authorized for detection and/or diagnosis of SARS-CoV-2 by FDA under an Emergency Use Authorization (EUA).  This EUA will remain in effect (meaning this test can be used) for the duration of the COVID-19 declaration under Section 564(b)(1) of the Act, 21 U.S.C. section 360bbb-3(b)(1), unless the authorization is terminated or revoked sooner.  Performed at [facility], [address], [place], [place] [address]      Time coordinating discharge: 25 minutes The [place] controlled substances registry was reviewed for this patient prior to filling the <5 days supply controlled substances script, no concurrent opiate or benzodiazepine prescriptions noted.      SIGNED:   Edwin Dada, MD  [facility] 05/07/2020, 8:33 AM

## 2020-05-07 NOTE — Progress Notes (Signed)
Physical Therapy Treatment Patient Details Name: Brandi Hunter MRN: [id number] DOB: [birth year] Today's Date: 05/07/2020    History of Present Illness 79 y.o. female presenting to ED with progressively worsening neck/shoulder pain and dyspnea. PMH including asthma, chronic coagulation, COPD, type 2 diabetes, DJD, GERD, HTN, hyperlipidemia, history of Mobitz type II heart block, history of pacemaker placement, obesity, paroxysmal atrial fibrillation, history of pancreatitis, sleep apnea on CPAP.    PT Comments    Pt ambulates significantly longer distance this session, but uses RW for steadying to maintain independence and reduce risk for falls. Pt c/o fatigue/SOB and L shoulder/neck pins and needles sensation with ambulation, educated to decrease BUE weightbearing on RW and instead push like shopping cart to improve symptoms with good results. Pt requires 2 standing rest breaks to recover while ambulating on 2L. Pt tolerates remaining up in chair at EOS and declines seated therapeutic exercises due to sleeping poorly last night and wanting to rest after ambulation. Patient will benefit from continued physical therapy in hospital and recommendations below to increase strength, balance, endurance for safe ADLs and gait.    Follow Up Recommendations  Home health PT;Supervision - Intermittent     Equipment Recommendations  None recommended by PT    Recommendations for Other Services       Precautions / Restrictions Precautions Precautions: Fall Restrictions Weight Bearing Restrictions: No    Mobility  Bed Mobility Overal bed mobility: Modified Independent  General bed mobility comments: in recliner upon arrival  Transfers Overall transfer level: Needs assistance Equipment used: None Transfers: Sit to/from Stand Sit to Stand: Supervision  General transfer comment: SUPV to rise, BUE assisting to power up, good steadiness upon standing  Ambulation/Gait Ambulation/Gait assistance:  Supervision;Min guard Gait Distance (Feet): 140 Feet (2 standing rest breaks) Assistive device: Rolling walker (2 wheeled) Gait Pattern/deviations: Step-through pattern;Decreased stride length;Narrow base of support Gait velocity: decreased   General Gait Details: attempted ambulation without AD, but fatigued and unsteadiness noted by pt reaching for wall and furniture so encouraged pt to use RW; ambulates with narros BOS using RW, mild unsteadiness but able to maintain balance independently with RW and no loss of balance, requires 2 standing rest breaks to recover with gait, limited 2* SOB and L shoulder/neck pins and needles sensation   Stairs             Wheelchair Mobility    Modified Rankin (Stroke Patients Only)       Balance Overall balance assessment: Needs assistance Sitting-balance support: No upper extremity supported;Feet supported Sitting balance-Leahy Scale: Good     Standing balance support: During functional activity;Bilateral upper extremity supported Standing balance-Leahy Scale: Fair Standing balance comment: with RW         Cognition Arousal/Alertness: Awake/alert Behavior During Therapy: WFL for tasks assessed/performed Overall Cognitive Status: Within Functional Limits for tasks assessed               Exercises      General Comments General comments (skin integrity, edema, etc.): on 2L with SpO2 94-98%      Pertinent Vitals/Pain Pain Assessment: Faces Faces Pain Scale: Hurts little more Pain Location: L shoulder/neck Pain Descriptors / Indicators: Aching;Grimacing;Guarding;Pins and needles Pain Intervention(s): Limited activity within patient's tolerance;Monitored during session    Home Living Family/patient expects to be discharged to:: Private residence Living Arrangements: Children Available Help at Discharge: Family Type of Home: Mobile home Home Access: Stairs to enter Entrance Stairs-Rails: Right;Left Home Layout: One  level Home Equipment: Kasandra Knudsen -  quad;Walker - 4 wheels;Walker - 2 wheels;Bedside commode;Shower seat Additional Comments: 2L home O2    Prior Function Level of Independence: Needs assistance  Gait / Transfers Assistance Needed: Uses cane as needed and holds on to objects at home ADL's / Homemaking Assistance Needed: Pt performing ADLs and simple IADLs. Reports that she has been cooking and driving when she feels good.     PT Goals (current goals can now be found in the care plan section) Acute Rehab PT Goals Patient Stated Goal: return home with family to assist PT Goal Formulation: With patient Time For Goal Achievement: 05/09/20 Potential to Achieve Goals: Good Progress towards PT goals: Progressing toward goals    Frequency    Min 2X/week      PT Plan Current plan remains appropriate    Co-evaluation              AM-PAC PT "6 Clicks" Mobility   Outcome Measure  Help needed turning from your back to your side while in a flat bed without using bedrails?: None Help needed moving from lying on your back to sitting on the side of a flat bed without using bedrails?: None Help needed moving to and from a bed to a chair (including a wheelchair)?: A Little Help needed standing up from a chair using your arms (e.g., wheelchair or bedside chair)?: None Help needed to walk in hospital room?: A Little Help needed climbing 3-5 steps with a railing? : A Little 6 Click Score: 21    End of Session Equipment Utilized During Treatment: Oxygen Activity Tolerance: Patient tolerated treatment well;Patient limited by fatigue;Patient limited by pain Patient left: in chair;with call bell/phone within reach Nurse Communication: Mobility status PT Visit Diagnosis: Other abnormalities of gait and mobility (R26.89);Unsteadiness on feet (R26.81);Muscle weakness (generalized) (M62.81)     Time: 3419-6222 PT Time Calculation (min) (ACUTE ONLY): 14 min  Charges:  $Gait Training: 8-22  mins                      Tori Cody Oliger PT, DPT 05/07/20, 10:12 AM [phone]

## 2020-05-07 NOTE — Evaluation (Signed)
Occupational Therapy Evaluation Patient Details Name: Brandi Hunter MRN: [id number] DOB: 27-May-1941 Today's Date: 05/07/2020    History of Present Illness 79 y.o. female presenting to ED with progressively worsening neck/shoulder pain and dyspnea. PMH including asthma, chronic coagulation, COPD, type 2 diabetes, DJD, GERD, HTN, hyperlipidemia, history of Mobitz type II heart block, history of pacemaker placement, obesity, paroxysmal atrial fibrillation, history of pancreatitis, sleep apnea on CPAP.   Clinical Impression   PTA, pt was living with her son and was independent with BADLs and light IADLs; uses 2L home O2. Pt moved with with son in February for increased support while she had pneumonia and would like to get stronger and return to her own home. Currently, pt requires Min Guard-Min A for LB ADLs and functional mobility. Pt presenting with decreased activity tolerance as seen by fatigue and decreased balance with progressive activity requiring Min A. VSS throughout on 2L O2. Provided education on energy conservation for ADLs and IADLs to optimize safety and independence. Pt would benefit from further acute OT to facilitate safe dc. Recommend dc to home once medically stable per physician.    Follow Up Recommendations  No OT follow up    Equipment Recommendations  None recommended by OT    Recommendations for Other Services PT consult     Precautions / Restrictions Precautions Precautions: Fall      Mobility Bed Mobility Overal bed mobility: Modified Independent             General bed mobility comments: Increased time  Transfers Overall transfer level: Needs assistance   Transfers: Sit to/from Stand Sit to Stand: Supervision;Min guard         General transfer comment: Supervision-Min Guard depending on her fatigue level    Balance Overall balance assessment: Needs assistance Sitting-balance support: No upper extremity supported;Feet supported Sitting  balance-Leahy Scale: Good     Standing balance support: No upper extremity supported;During functional activity Standing balance-Leahy Scale: Good                             ADL either performed or assessed with clinical judgement   ADL Overall ADL's : Needs assistance/impaired Eating/Feeding: Set up;Sitting   Grooming: Min guard;Standing   Upper Body Bathing: Supervision/ safety   Lower Body Bathing: Min guard;Sit to/from stand   Upper Body Dressing : Supervision/safety;Sitting   Lower Body Dressing: Min guard;Sit to/from stand   Toilet Transfer: Min guard (simulated at [employer])           Functional mobility during ADLs: Minimal assistance;Min guard General ADL Comments: Pt performing ADLs and functional mobility at [employer] A level. Requiring Min A with mobility as she fatigues. Provided education and handout on energy conservation; pt verbalized ways she performs techiques at home     Vision         Perception     Praxis      Pertinent Vitals/Pain Pain Assessment: Faces Faces Pain Scale: Hurts little more Pain Location: Generalized Pain Descriptors / Indicators: Sore;Discomfort Pain Intervention(s): Monitored during session;Repositioned     Hand Dominance     Extremity/Trunk Assessment Upper Extremity Assessment Upper Extremity Assessment: Overall WFL for tasks assessed   Lower Extremity Assessment Lower Extremity Assessment: Defer to PT evaluation   Cervical / Trunk Assessment Cervical / Trunk Assessment: Normal   Communication Communication Communication: No difficulties   Cognition Arousal/Alertness: Awake/alert Behavior During Therapy: WFL for tasks assessed/performed Overall Cognitive Status:  Within Functional Limits for tasks assessed                                     General Comments  SpO2 >90% on 2L throughout session    Exercises     Shoulder Instructions      Home Living Family/patient  expects to be discharged to:: Private residence Living Arrangements: Children Available Help at Discharge: Family Type of Home: Mobile home Home Access: Stairs to enter Entrance Stairs-Number of Steps: 5 Entrance Stairs-Rails: [place]: One level     Bathroom Shower/Tub: Occupational psychologist: [place]: Mining engineer - 4 wheels;Walker - 2 wheels;Bedside commode;Shower seat   Additional Comments: 2L home O2      Prior Functioning/Environment Level of Independence: Needs assistance  Gait / Transfers Assistance Needed: Uses cane as needed and holds on to objects at home ADL's / Homemaking Assistance Needed: Pt performing ADLs and simple IADLs. Reports that she has been cooking and driving when she feels good.            OT Problem List: Decreased strength;Decreased range of motion;Decreased activity tolerance;Impaired balance (sitting and/or standing);Decreased knowledge of use of DME or AE;Decreased knowledge of precautions      OT Treatment/Interventions: Self-care/ADL training;Therapeutic exercise;Energy conservation;DME and/or AE instruction;Therapeutic activities;Patient/family education    OT Goals(Current goals can be found in the care plan section) Acute Rehab OT Goals Patient Stated Goal: "Get strong enough to go back to my house." OT Goal Formulation: With patient Time For Goal Achievement: 05/21/20 Potential to Achieve Goals: Good  OT Frequency: Min 2X/week   Barriers to D/C:            Co-evaluation              AM-PAC OT "6 Clicks" Daily Activity     Outcome Measure Help from another person eating meals?: A Little Help from another person taking care of personal grooming?: A Little Help from another person toileting, which includes using toliet, bedpan, or urinal?: A Little Help from another person bathing (including washing, rinsing, drying)?: A Little Help from another person to put on and taking  off regular upper body clothing?: A Little Help from another person to put on and taking off regular lower body clothing?: A Little 6 Click Score: 18   End of Session Equipment Utilized During Treatment: Oxygen (2L) Nurse Communication: Mobility status  Activity Tolerance: Patient limited by fatigue Patient left: in chair;with call bell/phone within reach;Other (comment) (with RT)  OT Visit Diagnosis: Unsteadiness on feet (R26.81);Other abnormalities of gait and mobility (R26.89);Muscle weakness (generalized) (M62.81)                Time: 7062-3762 OT Time Calculation (min): 24 min Charges:  OT General Charges $OT Visit: 1 Visit OT Evaluation $OT Eval Low Complexity: 1 Low OT Treatments $Self Care/Home Management : 8-22 mins  Amorie Rentz MSOT, OTR/L Acute Rehab Pager: [phone] Office: [place] 05/07/2020, 8:18 AM

## 2020-05-07 NOTE — TOC Transition Note (Addendum)
Transition of Care [facility]) - CM/SW Discharge Note  Patient Details  Name: Brandi Hunter MRN: [id number] Date of Birth: 03-Jun-1941  [facility] CM/SW Contact:  Sherie Don, LCSW Phone Number: 05/07/2020, 9:40 AM  Clinical Narrative: PT evaluation recommended HHPT. Patient requested [facility], but [facility] is no longer in-network with [employer]. Patient agreeable to referral to [facility]. CSW made referral to [facility] and [name] accepted referral. CSW informed [name] patient will discharge to her son's home and patient requested the patient be called on her cell phone to set up services. TOC signing off.  Addendum: Patient's son's address is [address] [place], [place] [address].  Final next level of care: [place] Barriers to Discharge: Barriers Resolved  Patient Goals and CMS Choice Patient states their goals for this hospitalization and ongoing recovery are:: Discharge to son's home CMS Medicare.gov Compare Post Acute Care list provided to:: Patient Choice offered to / list presented to : Patient  Discharge Plan and Services In-house Referral: Clinical Social Work      DME Arranged: N/A DME Agency: NA HH Arranged: PT, OT HH Agency: [place] Date [place]: 05/07/20 Time [place]: [phone] Representative spoke with at [place]: Beryle Beams  Readmission Risk Interventions No flowsheet data found.

## 2020-05-13 NOTE — Progress Notes (Signed)
Cardiology Office Note  Date: 05/14/2020   ID: Brandi Hunter, DOB 1941-07-07, MRN [id number]  PCP:  [name], [place]  Cardiologist:  Rozann Lesches, MD Electrophysiologist:  None   Chief Complaint: Hospital follow up CHF  History of Present Illness: Brandi Hunter is a 79 y.o. female with a history diastolic CHF, COPD on home O2 2 L baseline, HTN, history of Mobitz II status post PPM, PAF on Eliquis, OSA on CPAP.  Presented to the emergency department on 05/05/2020 at [facility] with a week of progressive shortness of breath with exertion, nonproductive cough.  She also complained of left shoulder pain during her evaluation.  She was noted to have hypoxia requiring up to 5 L of oxygen via nasal cannula as well as orthopnea and lower extremity edema.  In the emergency room her BNP was greater than 1200.  Chest x-ray showed congestion.  She was started on Lasix.  Discharge diagnosis was acute on chronic hypoxic respiratory failure due to acute on chronic diastolic heart failure. She ruled out for ACS with neg Trop neg x 2. D-dimer neg. BNP 1222.  Last encounter with Dr. Domenic Polite on 04/01/2020 for persistent atrial fibrillation.  She denies any sense of palpitations.  She was functional with her ADLs.  Continued using supplemental oxygen as before with her severe COPD.  Seeing Dr. [name] with [employer] pacemaker.  Chads vas score was 6.  She continued on rate control strategy with anticoagulation and reported no bleeding problems.  She is on both Cardizem CD and Toprol XL.  She denied any anginal symptoms.  Dr. Domenic Polite mentioned she would need follow-up CBC and BMP around the time of her next visit she continued on Lipitor, Imdur, and beta-blocker for CAD.  She denies any issues since discharge from hospital except for left shoulder pain.  She denies any progressive anginal symptoms, orthostatic symptoms, stroke or TIA-like symptoms, PND, orthopnea, claudication, lower extremity edema.  States she  was advised to weigh every day and to report to her primary care provider 3 pound weight gain in 24 hours or 5 pound weight gain in 1 week.  Her heart rate is well controlled at 81.  Blood pressure is 124/62.  No lower extremity edema.  She is currently wearing continuous O2 at 2 L nasal cannula.  She states by her scale today her weight was 205.5 pounds.  Past Medical History:  Diagnosis Date  . Asthma   . CAD (coronary artery disease)    a. catheterization in 03/2018 showing 90% ostial OM stenosis with medical management recommended at that time  . Chronic anticoagulation   . COPD (chronic obstructive pulmonary disease) ([place])   . DJD (degenerative joint disease)   . Essential hypertension   . GERD (gastroesophageal reflux disease)   . History of home oxygen therapy   . Hyperlipidemia   . Mobitz (type) II atrioventricular block   . Obesity   . Pacemaker    Implanted by Dr [name] (MDT) 10/06/10  . PAF (paroxysmal atrial fibrillation) ([place])   . Pancreatitis 2010 OR 2011  . Sleep apnea    CPAP  . Type 2 diabetes mellitus ([place])     Past Surgical History:  Procedure Laterality Date  . CARDIOVERSION N/A 08/18/2019   Procedure: CARDIOVERSION;  Surgeon: Lelon Perla, MD;  Location: [facility] ENDOSCOPY;  Service: Cardiovascular;  Laterality: N/A;  . CATARACT EXTRACTION W/PHACO  11/02/2011   Procedure: CATARACT EXTRACTION PHACO AND INTRAOCULAR LENS PLACEMENT ([place]);  Surgeon:  Williams Che;  Location: [facility] ORS;  Service: Ophthalmology;  Laterality: Right;  CDE=7.33  . CATARACT EXTRACTION W/PHACO  12/07/2011   Procedure: CATARACT EXTRACTION PHACO AND INTRAOCULAR LENS PLACEMENT (I[facility]);  Surgeon: Williams Che, MD;  Location: [facility] ORS;  Service: Ophthalmology;  Laterality: Left;  CDE 3.61  . COLONOSCOPY WITH PROPOFOL N/A 08/09/2014   Procedure: COLONOSCOPY WITH PROPOFOL;  Surgeon: Juanita Craver, MD;  Location: [facility] ENDOSCOPY;  Service: Endoscopy;  Laterality: N/A;  . CYSTOCELE REPAIR    .  ESOPHAGOGASTRODUODENOSCOPY (EGD) WITH PROPOFOL N/A 08/09/2014   Procedure: ESOPHAGOGASTRODUODENOSCOPY (EGD) WITH PROPOFOL;  Surgeon: Juanita Craver, MD;  Location: [facility] ENDOSCOPY;  Service: Endoscopy;  Laterality: N/A;  . EYE SURGERY  11/01/2012   BOTH EYES CATARACTS  . INSERT / REPLACE / REMOVE PACEMAKER  10/06/10   MDT  implanted by Dr [name]  . KNEE ARTHROSCOPY     both  . LEFT HEART CATH AND CORONARY ANGIOGRAPHY N/A 03/24/2018   Procedure: LEFT HEART CATH AND CORONARY ANGIOGRAPHY;  Surgeon: Lorretta Harp, MD;  Location: [facility];  Service: Cardiovascular;  Laterality: N/A;  . OVARY SURGERY     removal  . REPAIR RECTOCELE    . SIMPLE MASTECTOMY WITH AXILLARY SENTINEL NODE BIOPSY Left 01/09/2015   Procedure: Irrigation and Drainage Abcess left Axilla;  Surgeon: Jackolyn Confer, MD;  Location: [facility] ORS;  Service: General;  Laterality: Left;  . TONSILLECTOMY  AGE 43  . TOTAL ABDOMINAL HYSTERECTOMY  1971    Current Outpatient Medications  Medication Sig Dispense Refill  . albuterol (PROAIR HFA) 108 (90 BASE) MCG/ACT inhaler Inhale 2 puffs into the lungs every 4 (four) hours as needed for wheezing.     Marland Kitchen apixaban (ELIQUIS) 5 MG TABS tablet Take 1 tablet (5 mg total) by mouth 2 (two) times daily. 180 tablet 3  . atorvastatin (LIPITOR) 40 MG tablet Take 40 mg by mouth at bedtime.     . budesonide-formoterol (SYMBICORT) 160-4.5 MCG/ACT inhaler Inhale 2 puffs into the lungs 2 (two) times daily.    . diclofenac Sodium (VOLTAREN) 1 % GEL Apply 2 g topically 2 (two) times daily as needed (for pain).     Marland Kitchen diltiazem (CARDIZEM CD) 120 MG 24 hr capsule Take 1 capsule (120 mg total) by mouth daily. 90 capsule 3  . DULoxetine (CYMBALTA) 30 MG capsule Take 30 mg by mouth at bedtime.     . famotidine (PEPCID) 20 MG tablet Take 20 mg by mouth 2 (two) times daily.     . fenofibrate 160 MG tablet Take 160 mg by mouth every evening.    . furosemide (LASIX) 20 MG tablet Take 10 mg by mouth daily.     Marland Kitchen  HYDROcodone-acetaminophen (NORCO/VICODIN) 5-325 MG tablet Take 1 tablet by mouth every 6 (six) hours as needed for moderate pain.    Marland Kitchen ipratropium-albuterol (DUONEB) 0.5-2.5 (3) MG/3ML SOLN Take 3 mLs by nebulization 4 (four) times daily. 360 mL 1  . isosorbide mononitrate (IMDUR) 30 MG 24 hr tablet Take 1 tablet (30 mg total) by mouth 2 (two) times daily. 180 tablet 3  . metFORMIN (GLUCOPHAGE) 1000 MG tablet Take 1,000 mg by mouth 2 (two) times daily.    . metoprolol succinate (TOPROL-XL) 100 MG 24 hr tablet Take 1 tablet by mouth once daily (Patient taking differently: Take 100 mg by mouth every morning. ) 90 tablet 3  . nitroGLYCERIN (NITROSTAT) 0.4 MG SL tablet DISSOLVE ONE TABLET UNDER THE TONGUE EVERY 5 MINUTES AS  NEEDED FOR CHEST PAIN.  DO NOT EXCEED A TOTAL OF 3 DOSES IN 15 MINUTES (Patient taking differently: Place 0.4 mg under the tongue every 5 (five) minutes as needed. DISSOLVE ONE TABLET UNDER THE TONGUE EVERY 5 MINUTES AS NEEDED FOR CHEST PAIN.  DO NOT EXCEED A TOTAL OF 3 DOSES IN 15 MINUTES) 25 tablet 1  . OXYGEN Inhale 2 L into the lungs continuous.    . potassium chloride SA (KLOR-CON) 20 MEQ tablet Take 20 mEq by mouth 2 (two) times daily.    . Probiotic Product (ALIGN PO) Take 1 capsule by mouth daily.     Marland Kitchen spironolactone (ALDACTONE) 25 MG tablet Take 1 tablet by mouth once daily (Patient taking differently: Take 25 mg by mouth daily. ) 90 tablet 0   No current facility-administered medications for this visit.   Allergies:  Bee venom and Latex   Social History: The patient  reports that she quit smoking about 2 years ago. Her smoking use included cigarettes. She smoked 0.00 packs per day for 54.00 years. She quit smokeless tobacco use about 8 years ago. She reports previous alcohol use of about 1.0 standard drink of alcohol per week. She reports that she does not use drugs.   Family History: The patient's family history includes Congestive Heart Failure in her father and mother;  Diabetes in her brother; Osteoarthritis in her sister; Prostate cancer in her brother.   ROS:  Please see the history of present illness. Otherwise, complete review of systems is positive for none.  All other systems are reviewed and negative.   Physical Exam: VS:  BP 124/62   Pulse 81   Ht 5\' 6"  (1.676 m)   Wt 207 lb (93.9 kg)   SpO2 93%   BMI 33.41 kg/m , BMI Body mass index is 33.41 kg/m.  Wt Readings from Last 3 Encounters:  05/14/20 207 lb (93.9 kg)  05/07/20 221 lb 1.9 oz (100.3 kg)  04/01/20 226 lb (102.5 kg)    General: Patient appears comfortable at rest. Neck: Supple, no elevated JVP or carotid bruits, no thyromegaly. Lungs: Clear to auscultation, nonlabored breathing at rest. Cardiac: Regular rate and rhythm, no S3 or significant systolic murmur, no pericardial rub. Extremities: No pitting edema, distal pulses 2+. Skin: Warm and dry. Musculoskeletal: No kyphosis. Neuropsychiatric: Alert and oriented x3, affect grossly appropriate.  ECG:  EKG 05/05/2020 sinus rhythm rate of 74, premature ventricular complexes, short PR interval, intraventricular conduction defect consider atypical right bundle branch block, LVH with secondary repolarization abnormality, probable anterior infarct, old, probable inferior infarct, recent  Recent Labwork: 05/05/2020: ALT 31; AST 39; B Natriuretic Peptide 1,222.0; Magnesium 1.4 05/07/2020: BUN 48; Creatinine, Ser 1.42; Hemoglobin 9.3; Platelets 352; Potassium 4.5; Sodium 138     Component Value Date/Time   CHOL 125 03/24/2018 0508   TRIG 194 (H) 03/24/2018 0508   HDL 35 (L) 03/24/2018 0508   CHOLHDL 3.6 03/24/2018 0508   VLDL 39 03/24/2018 0508   LDLCALC 51 03/24/2018 0508    Other Studies Reviewed Today:  Echocardiogram 05/06/2020  1. Left ventricular ejection fraction, by estimation, is 60 to 65%. The left ventricle has normal function. The left ventricle has no regional wall motion abnormalities. There is mild left ventricular  hypertrophy. Left ventricular diastolic parameters are consistent with Grade II diastolic dysfunction (pseudonormalization). Elevated left atrial pressure. 2. Right ventricular systolic function is normal. The right ventricular size is normal. There is moderately elevated pulmonary artery systolic pressure. 3. Left atrial size  was severely dilated. 4. Right atrial size was severely dilated. 5. The mitral valve is normal in structure. Mild to moderate mitral valve regurgitation. No evidence of mitral stenosis. 6. The aortic valve has an indeterminant number of cusps. Aortic valve regurgitation is not visualized. No aortic stenosis is present. 7. The inferior vena cava is normal in size with greater than 50% respiratory variability, suggesting right atrial pressure of 3 mmHg.  Echocardiogram 07/19/2018: Study Conclusions  - Left ventricle: The cavity size was normal. Wall thickness was normal. Systolic function was normal. The estimated ejection fraction was in the range of 55% to 60%. Wall motion was normal; there were no regional wall motion abnormalities. Indeterminate diastolic function. - Aortic valve: Mildly to moderately calcified annulus. Trileaflet. - Mitral valve: Mildly thickened leaflets. There was mild regurgitation. - Right ventricle: Pacer wire or catheter noted in right ventricle. - Right atrium: Central venous pressure (est): 3 mm Hg. - Atrial septum: No defect or patent foramen ovale was identified. - Tricuspid valve: There was trivial regurgitation. - Pulmonary arteries: Systolic pressure could not be accurately estimated. - Pericardium, extracardiac: There was no pericardial effusion.  Assessment and Plan:  1. Acute on chronic diastolic heart failure (HCC)   2. Persistent atrial fibrillation ([place])   3. 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   4. Coronary artery disease involving native coronary artery of native  heart with angina pectoris ([place])     1. Acute on chronic diastolic heart failure ([place]) Recent admission for acute on chronic hypoxic respiratory failure and associated acute on chronic diastolic heart failure.  She was treated with IV Lasix and lost approximately 5.369 L of fluid.  She was advised at discharge to weigh her self every day and report an increase of weight of 3 pounds over 24-hour period or 5 pounds in a week to her PCP.  Continue furosemide 10 mg daily.  Continue Toprol-XL 100 mg daily.  2. Persistent atrial fibrillation (HCC) Heart rate today is 81 and regular.  Continue diltiazem 120 mg by mouth daily.  Continue Eliquis 5 mg p.o. twice daily.  3. 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 Patient has a [employer] pacemaker in place and follows with Dr. [name]  4. Coronary artery disease involving native coronary artery of native heart with angina pectoris [facility]) Denies any anginal symptoms.  Has chronic dyspnea secondary to severe COPD on 2 L nasal cannula continuously.  Continue Imdur 30 mg daily.  Toprol-XL 100 mg daily.  Continue atorvastatin 40 mg daily.  Continue sublingual nitroglycerin as needed.   Medication Adjustments/Labs and Tests Ordered: Current medicines are reviewed at length with the patient today.  Concerns regarding medicines are outlined above.   Disposition: Follow-up with Dr. Domenic Polite at September 16, 2020 appointment  Signed, Levell July, NP 05/14/2020 12:18 PM    [facility] [place], [place], [place] [address] Phone: [phone]; Fax: [phone]

## 2020-05-14 ENCOUNTER — Encounter: Payer: Self-pay | Admitting: Family Medicine

## 2020-05-14 ENCOUNTER — Ambulatory Visit: Payer: Medicare HMO | Admitting: Family Medicine

## 2020-05-14 VITALS — BP 124/62 | HR 81 | Ht 66.0 in | Wt 207.0 lb

## 2020-05-14 DIAGNOSIS — I4819 Other persistent atrial fibrillation: Secondary | ICD-10-CM

## 2020-05-14 DIAGNOSIS — I441 Atrioventricular block, second degree: Secondary | ICD-10-CM

## 2020-05-14 DIAGNOSIS — I5033 Acute on chronic diastolic (congestive) heart failure: Secondary | ICD-10-CM | POA: Diagnosis not present

## 2020-05-14 DIAGNOSIS — I25119 Atherosclerotic heart disease of native coronary artery with unspecified angina pectoris: Secondary | ICD-10-CM | POA: Diagnosis not present

## 2020-05-14 NOTE — Patient Instructions (Signed)
Medication Instructions:   Your physician recommends that you continue on your current medications as directed. Please refer to the Current Medication list given to you today. *If you need a refill on your cardiac medications before your next appointment, please call your pharmacy*   Lab Work:  NONE If you have labs (blood work) drawn today and your tests are completely normal, you will receive your results only by: Marland Kitchen MyChart Message (if you have MyChart) OR . A paper copy in the mail If you have any lab test that is abnormal or we need to change your treatment, we will call you to review the results.   Testing/Procedures:  NONE  Follow-Up: At [facility], you and your health needs are our priority.  As part of our continuing mission to provide you with exceptional heart care, we have created designated Provider Care Teams.  These Care Teams include your primary Cardiologist (physician) and Advanced Practice Providers (APPs -  Physician Assistants and Nurse Practitioners) who all work together to provide you with the care you need, when you need it.  We recommend signing up for the patient portal called "MyChart".  Sign up information is provided on this After Visit Summary.  MyChart is used to connect with patients for Virtual Visits (Telemedicine).  Patients are able to view lab/test results, encounter notes, upcoming appointments, etc.  Non-urgent messages can be sent to your provider as well.   To learn more about what you can do with MyChart, go to [email or web address].    Your next appointment:    As planned in November 2021  The format for your next appointment:   In Person  Provider:   Rozann Lesches, MD

## 2020-05-28 ENCOUNTER — Other Ambulatory Visit: Payer: Self-pay | Admitting: Cardiology

## 2020-06-07 ENCOUNTER — Ambulatory Visit (INDEPENDENT_AMBULATORY_CARE_PROVIDER_SITE_OTHER): Payer: Medicare HMO | Admitting: *Deleted

## 2020-06-07 DIAGNOSIS — I441 Atrioventricular block, second degree: Secondary | ICD-10-CM

## 2020-06-08 LAB — CUP PACEART REMOTE DEVICE CHECK
Battery Impedance: 1603 Ohm
Battery Remaining Longevity: 33 mo
Battery Voltage: 2.74 V
Brady Statistic AP VP Percent: 77 %
Brady Statistic AP VS Percent: 2 %
Brady Statistic AS VP Percent: 8 %
Brady Statistic AS VS Percent: 13 %
Date Time Interrogation Session: 20210723152621
Implantable Lead Implant Date: 20111121
Implantable Lead Implant Date: 20111121
Implantable Lead Location: 753859
Implantable Lead Location: 753860
Implantable Lead Model: 4469
Implantable Lead Model: 4470
Implantable Lead Serial Number: 548226
Implantable Lead Serial Number: 687643
Implantable Pulse Generator Implant Date: 20111121
Lead Channel Impedance Value: 432 Ohm
Lead Channel Impedance Value: 485 Ohm
Lead Channel Pacing Threshold Amplitude: 0.625 V
Lead Channel Pacing Threshold Amplitude: 0.625 V
Lead Channel Pacing Threshold Pulse Width: 0.4 ms
Lead Channel Pacing Threshold Pulse Width: 0.4 ms
Lead Channel Setting Pacing Amplitude: 2.5 V
Lead Channel Setting Pacing Amplitude: 2.75 V
Lead Channel Setting Pacing Pulse Width: 0.4 ms
Lead Channel Setting Sensing Sensitivity: 5.6 mV

## 2020-06-10 NOTE — Progress Notes (Signed)
Remote pacemaker transmission.   

## 2020-06-19 NOTE — Progress Notes (Signed)
Primary Care Physician: [name], [place] Primary Cardiologist: Dr Domenic Polite Primary Electrophysiologist: Dr [name]  Referring Physician: Dr Trevor Iha Brandi Hunter is a 79 y.o. 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 Patient had previously had a very low AF burden on device interrogation. However, she was noted to be persistently in afib for over 6 months on device interrogation. Patient notes a gradual onset of fatigue during that time. She underwent DCCV on 08/18/19 x3 but quickly reverted to afib each time. She was admitted on 10/13-10/15/20 with an acute COPD exacerbation.   On follow up today, patient reports that she is doing reasonably well. She has noticed ~5 lbs weight gain overt the last week. She denies worsening SOB, PND, or orthopnea. She remains rate controlled and unaware of her arrhythmia.   Today, she denies symptoms of palpitations, chest pain, orthopnea, PND, dizziness, presyncope, syncope, snoring, daytime somnolence, bleeding, or neurologic sequela. The patient is tolerating medications without difficulties and is otherwise without complaint today.    Atrial Fibrillation Risk Factors:  she does have symptoms or diagnosis of sleep apnea. she is compliant with CPAP therapy.   she has a BMI of Body mass index is 34.83 kg/m.Marland Kitchen Filed Weights   06/20/20 1153  Weight: 97.9 kg    Family History  Problem Relation Age of Onset  . Congestive Heart Failure Mother   . Congestive Heart Failure Father   . Osteoarthritis Sister   . Prostate cancer Brother   . Diabetes Brother   . Anesthesia problems Neg Hx   . Hypotension Neg Hx   . Malignant hyperthermia Neg Hx   . Pseudochol deficiency Neg Hx      Atrial Fibrillation Management history:  Previous antiarrhythmic drugs: none Previous cardioversions:  08/18/19 Previous ablations: none CHADS2VASC score: 6 Anticoagulation history: Eliquis    Past Medical History:  Diagnosis Date  . Asthma   . CAD (coronary artery disease)    a. catheterization in 03/2018 showing 90% ostial OM stenosis with medical management recommended at that time  . Chronic anticoagulation   . COPD (chronic obstructive pulmonary disease) ([place])   . DJD (degenerative joint disease)   . Essential hypertension   . GERD (gastroesophageal reflux disease)   . History of home oxygen therapy   . Hyperlipidemia   . Mobitz (type) II atrioventricular block   . Obesity   . Pacemaker    Implanted by Dr [name] (MDT) 10/06/10  . PAF (paroxysmal atrial fibrillation) ([place])   . Pancreatitis 2010 OR 2011  . Sleep apnea    CPAP  . Type 2 diabetes mellitus ([place])    Past Surgical History:  Procedure Laterality Date  . CARDIOVERSION N/A 08/18/2019   Procedure: CARDIOVERSION;  Surgeon: Lelon Perla, MD;  Location[facility] END[facility]COPY;  Service: Cardiovascular;  Laterality: N/A;  . CATARACT EXTRACTION W/PHACO  11/02/2011   Procedure: CATARACT EXTRACTION PHACO AND INTRAOCULAR LENS PLACEMENT ([place]);  Surgeon: Williams Che;  Location: [facility] ORS;  Service: Ophthalmology;  Laterality: Right;  CDE=7.33  . CATARACT EXTRACTION W/PHACO  12/07/2011   Procedure: CATARACT EXTRACTION PHACO AND INTRAOCULAR LENS PLACEMENT (IOC);  Surgeon: Williams Che, MD;  Location: [facility] ORS;  Service: Ophthalmology;  Laterality: Left;  CDE 3.61  . COLONOSCOPY WITH PROPOFOL N/A 08/09/2014   Procedure: COLONOSCOPY WITH PROPOFOL;  Surgeon: Mar Daring  Collene Mares, MD;  Location: [facility] ENDOS[facility];  Service: Endoscopy;  Laterality: N/A;  . CYSTOCELE REPAIR    . ESOPHAGOGASTRODUODENOSCOPY (EGD) WITH PROPOFOL N/A 08/09/2014   Procedure: ESOPHAGOGASTRODUODENOSCOPY (EGD) WITH PROPOFOL;  Surgeon: Juanita Craver, MD;  Location: [facility] ENDOSCOPY;  Service: Endoscopy;  Laterality: N/A;  . EYE SURGERY  11/01/2012   BOTH EYES CATARACTS  . INSERT /  REPLACE / REMOVE PACEMAKER  10/06/10   MDT  implanted by Dr [name]  . KNEE ARTHROSCOPY     both  . LEFT HEART CATH AND CORONARY ANGIOGRAPHY N/A 03/24/2018   Procedure: LEFT HEART CATH AND CORONARY ANGIOGRAPHY;  Surgeon: Lorretta Harp, MD;  Location: [facility] LAB;  Service: Cardiovascular;  Laterality: N/A;  . OVARY SURGERY     removal  . REPAIR RECTOCELE    . SIMPLE MASTECTOMY WITH AXILLARY SENTINEL NODE BIOPSY Left 01/09/2015   Procedure: Irrigation and Drainage Abcess left Axilla;  Surgeon: Jackolyn Confer, MD;  Location: [facility] ORS;  Service: General;  Laterality: Left;  . TONSILLECTOMY  AGE 17  . TOTAL ABDOMINAL HYSTERECTOMY  1971    Current Outpatient Medications  Medication Sig Dispense Refill  . albuterol (PROAIR HFA) 108 (90 BASE) MCG/ACT inhaler Inhale 2 puffs into the lungs every 4 (four) hours as needed for wheezing.     Marland Kitchen apixaban (ELIQUIS) 5 MG TABS tablet Take 1 tablet (5 mg total) by mouth 2 (two) times daily. 180 tablet 3  . atorvastatin (LIPITOR) 40 MG tablet Take 40 mg by mouth at bedtime.     . budesonide-formoterol (SYMBICORT) 160-4.5 MCG/ACT inhaler Inhale 2 puffs into the lungs 2 (two) times daily.    . diclofenac Sodium (VOLTAREN) 1 % GEL Apply 2 g topically 2 (two) times daily as needed (for pain).     Marland Kitchen diltiazem (CARDIZEM CD) 120 MG 24 hr capsule Take 1 capsule (120 mg total) by mouth daily. 90 capsule 3  . DULoxetine (CYMBALTA) 30 MG capsule Take 30 mg by mouth at bedtime.     . famotidine (PEPCID) 20 MG tablet Take 20 mg by mouth 2 (two) times daily.     . fenofibrate 160 MG tablet Take 160 mg by mouth every evening.    . furosemide (LASIX) 20 MG tablet Take 1 tablet by mouth once daily 90 tablet 3  . HYDROcodone-acetaminophen (NORCO/VICODIN) 5-325 MG tablet Take 1 tablet by mouth every 6 (six) hours as needed for moderate pain.    Marland Kitchen ipratropium-albuterol (DUONEB) 0.5-2.5 (3) MG/3ML SOLN Take 3 mLs by nebulization 4 (four) times daily. 360 mL 1  .  isosorbide mononitrate (IMDUR) 30 MG 24 hr tablet Take 1 tablet (30 mg total) by mouth 2 (two) times daily. 180 tablet 3  . metFORMIN (GLUCOPHAGE) 1000 MG tablet Take 500 mg by mouth 2 (two) times daily.     . metoprolol succinate (TOPROL-XL) 100 MG 24 hr tablet Take 1 tablet by mouth once daily (Patient taking differently: Take 100 mg by mouth every morning. ) 90 tablet 3  . Probiotic Product (ALIGN PO) Take 1 capsule by mouth daily.     Marland Kitchen spironolactone (ALDACTONE) 25 MG tablet Take 1 tablet (25 mg total) by mouth daily. 90 tablet 2  . nitroGLYCERIN (NITROSTAT) 0.4 MG SL tablet DISSOLVE ONE TABLET UNDER THE TONGUE EVERY 5 MINUTES AS NEEDED FOR CHEST PAIN.  DO NOT EXCEED A TOTAL OF 3 DOSES IN 15 MINUTES (Patient not taking: Reported on 06/20/2020) 25 tablet 1  . OXYGEN Inhale 2 L into  the lungs continuous.    . potassium chloride SA (KLOR-CON) 20 MEQ tablet Take 20 mEq by mouth 2 (two) times daily. (Patient not taking: Reported on 06/20/2020)     No current facility-administered medications for this encounter.    Allergies  Allergen Reactions  . Bee Venom Swelling  . Latex Swelling    LATEX CATHETERS    Social History   Socioeconomic History  . Marital status: Widowed    Spouse name: Not on file  . Number of children: 3  . Years of education: College  . Highest education level: Not on file  Occupational History  . Occupation: Part time Retail buyer: [employer]  Tobacco Use  . Smoking status: Former Smoker    Packs/day: 0.00    Years: 54.00    Pack years: 0.00    Types: Cigarettes    Quit date: 09/22/2017    Years since quitting: 2.7  . Smokeless tobacco: Former Systems developer    Quit date: 11/17/2011  Vaping Use  . Vaping Use: Never used  Substance and Sexual Activity  . Alcohol use: Not Currently    Alcohol/week: 1.0 standard drink    Types: 1 Glasses of wine per week    Comment: couple glasses of wine occasionally-once a month  . Drug use: No  . Sexual  activity: Not on file  Other Topics Concern  . Not on file  Social History Narrative   Patient lives at home alone.   Caffeine Use: 16oz drink daily   Social Determinants of Health   Financial Resource Strain:   . Difficulty of Paying Living Expenses:   Food Insecurity:   . Worried About Charity fundraiser in the Last Year:   . Arboriculturist in the Last Year:   Transportation Needs:   . Film/video editor (Medical):   Marland Kitchen Lack of Transportation (Non-Medical):   Physical Activity:   . Days of Exercise per Week:   . Minutes of Exercise per Session:   Stress:   . Feeling of Stress :   Social Connections:   . Frequency of Communication with Friends and Family:   . Frequency of Social Gatherings with Friends and Family:   . Attends Religious Services:   . Active Member of Clubs or Organizations:   . Attends Archivist Meetings:   Marland Kitchen Marital Status:   Intimate Partner Violence:   . Fear of Current or Ex-Partner:   . Emotionally Abused:   Marland Kitchen Physically Abused:   . Sexually Abused:      ROS- All systems are reviewed and negative except as per the HPI above.  Physical Exam: Vitals:   06/20/20 1153  BP: 128/70  Pulse: 89  Weight: 97.9 kg  Height: 5\' 6"  (1.676 m)    GEN- The patient is well appearing obese elderly female, alert and oriented x 3 today.   HEENT-head normocephalic, atraumatic, sclera clear, conjunctiva pink, hearing intact, trachea midline. Lungs- Clear to ausculation bilaterally, normal work of breathing Heart- irregular rate and rhythm, no murmurs, rubs or gallops  GI- soft, NT, ND, + BS Extremities- no clubbing, cyanosis. Trace bilateral edema MS- no significant deformity or atrophy Skin- no rash or lesion Psych- euthymic mood, full affect Neuro- strength and sensation are intact   Wt Readings from Last 3 Encounters:  06/20/20 97.9 kg  05/14/20 93.9 kg  05/07/20 100.3 kg    EKG today demonstrates V paced rhythm HR 89, PVC, QRS 154,  QTc 493  Echo 07/19/18 demonstrated  - Left ventricle: The cavity size was normal. Wall thickness was   normal. Systolic function was normal. The estimated ejection   fraction was in the range of 55% to 60%. Wall motion was normal;   there were no regional wall motion abnormalities. Indeterminate   diastolic function. - Aortic valve: Mildly to moderately calcified annulus. Trileaflet. - Mitral valve: Mildly thickened leaflets. There was mild   regurgitation. - Right ventricle: Pacer wire or catheter noted in right ventricle. - Right atrium: Central venous pressure (est): 3 mm Hg. - Atrial septum: No defect or patent foramen ovale was identified. - Tricuspid valve: There was trivial regurgitation. - Pulmonary arteries: Systolic pressure could not be accurately   estimated. - Pericardium, extracardiac: There was no pericardial effusion.  Epic records are reviewed at length today  Assessment and Plan:  1. Persistent atrial fibrillation Continue Eliquis 5 mg BID Continue Toprol 100 mg daily  This patients CHA2DS2-VASc Score and unadjusted Ischemic Stroke Rate (% per year) is equal to 9.7 % stroke rate/year from a score of 6  Above score calculated as 1 point each if present [CHF, HTN, DM, Vascular=MI/PAD/Aortic Plaque, Age if 65-74, or Female] Above score calculated as 2 points each if present [Age > 75, or Stroke/TIA/TE]  2. Obesity Body mass index is 34.83 kg/m. Lifestyle modification was discussed and encouraged including regular physical activity and weight reduction.  3. Obstructive sleep apnea Encouraged CPAP use.  4. CAD No anginal symptoms.  5. HTN Stable, no changes today.  6. Second degree AV block type II S/p PPM, followed by Dr [name] and the device clinic.  7. Chronic diastolic CHF Her weight is up 5 lbs. Will have her double Lasix x 3 days and take K+ with it. Bmet today. If her fluid status does not improve, will need visit with general  cardiologist.   Follow up with Dr Domenic Polite as scheduled. Dr [name] in 3 months. Patient would prefer to be followed in [place] as this is much more convenient for her.    [facility] [address] [place], [place] [address] [phone] 06/20/2020 1:28 PM

## 2020-06-20 ENCOUNTER — Other Ambulatory Visit: Payer: Self-pay

## 2020-06-20 ENCOUNTER — Ambulatory Visit (HOSPITAL_COMMUNITY)
Admission: RE | Admit: 2020-06-20 | Discharge: 2020-06-20 | Disposition: A | Discharge status: Home / Self Care | Payer: Medicare HMO | Source: Ambulatory Visit | Attending: Physician Assistant | Admitting: Physician Assistant

## 2020-06-20 ENCOUNTER — Encounter (HOSPITAL_COMMUNITY): Payer: Self-pay | Admitting: Physician Assistant

## 2020-06-20 VITALS — BP 128/70 | HR 89 | Ht 66.0 in | Wt 215.8 lb

## 2020-06-20 DIAGNOSIS — Z9989 Dependence on other enabling machines and devices: Secondary | ICD-10-CM | POA: Diagnosis not present

## 2020-06-20 DIAGNOSIS — Z961 Presence of intraocular lens: Secondary | ICD-10-CM | POA: Diagnosis not present

## 2020-06-20 DIAGNOSIS — Z79899 Other long term (current) drug therapy: Secondary | ICD-10-CM | POA: Diagnosis not present

## 2020-06-20 DIAGNOSIS — Z7951 Long term (current) use of inhaled steroids: Secondary | ICD-10-CM | POA: Insufficient documentation

## 2020-06-20 DIAGNOSIS — Z9861 Coronary angioplasty status: Secondary | ICD-10-CM | POA: Diagnosis not present

## 2020-06-20 DIAGNOSIS — K219 Gastro-esophageal reflux disease without esophagitis: Secondary | ICD-10-CM | POA: Insufficient documentation

## 2020-06-20 DIAGNOSIS — Z6834 Body mass index (BMI) 34.0-34.9, adult: Secondary | ICD-10-CM | POA: Diagnosis not present

## 2020-06-20 DIAGNOSIS — J449 Chronic obstructive pulmonary disease, unspecified: Secondary | ICD-10-CM | POA: Insufficient documentation

## 2020-06-20 DIAGNOSIS — I441 Atrioventricular block, second degree: Secondary | ICD-10-CM | POA: Insufficient documentation

## 2020-06-20 DIAGNOSIS — I2510 Atherosclerotic heart disease of native coronary artery without angina pectoris: Secondary | ICD-10-CM | POA: Insufficient documentation

## 2020-06-20 DIAGNOSIS — I110 Hypertensive heart disease with heart failure: Secondary | ICD-10-CM | POA: Insufficient documentation

## 2020-06-20 DIAGNOSIS — Z9841 Cataract extraction status, right eye: Secondary | ICD-10-CM | POA: Diagnosis not present

## 2020-06-20 DIAGNOSIS — Z7901 Long term (current) use of anticoagulants: Secondary | ICD-10-CM | POA: Insufficient documentation

## 2020-06-20 DIAGNOSIS — Z87891 Personal history of nicotine dependence: Secondary | ICD-10-CM | POA: Diagnosis not present

## 2020-06-20 DIAGNOSIS — G4733 Obstructive sleep apnea (adult) (pediatric): Secondary | ICD-10-CM | POA: Insufficient documentation

## 2020-06-20 DIAGNOSIS — Z8249 Family history of ischemic heart disease and other diseases of the circulatory system: Secondary | ICD-10-CM | POA: Diagnosis not present

## 2020-06-20 DIAGNOSIS — Z950 Presence of cardiac pacemaker: Secondary | ICD-10-CM | POA: Diagnosis not present

## 2020-06-20 DIAGNOSIS — E785 Hyperlipidemia, unspecified: Secondary | ICD-10-CM | POA: Insufficient documentation

## 2020-06-20 DIAGNOSIS — E669 Obesity, unspecified: Secondary | ICD-10-CM | POA: Diagnosis not present

## 2020-06-20 DIAGNOSIS — E119 Type 2 diabetes mellitus without complications: Secondary | ICD-10-CM | POA: Insufficient documentation

## 2020-06-20 DIAGNOSIS — D6869 Other thrombophilia: Secondary | ICD-10-CM

## 2020-06-20 DIAGNOSIS — I5032 Chronic diastolic (congestive) heart failure: Secondary | ICD-10-CM | POA: Diagnosis not present

## 2020-06-20 DIAGNOSIS — I4819 Other persistent atrial fibrillation: Secondary | ICD-10-CM | POA: Insufficient documentation

## 2020-06-20 DIAGNOSIS — Z7984 Long term (current) use of oral hypoglycemic drugs: Secondary | ICD-10-CM | POA: Insufficient documentation

## 2020-06-20 LAB — BASIC METABOLIC PANEL
Anion gap: 10 (ref 5–15)
BUN: 24 mg/dL — ABNORMAL HIGH (ref 8–23)
CO2: 25 mmol/L (ref 22–32)
Calcium: 9.3 mg/dL (ref 8.9–10.3)
Chloride: 103 mmol/L (ref 98–111)
Creatinine, Ser: 1.45 mg/dL — ABNORMAL HIGH (ref 0.44–1.00)
GFR calc Af Amer: 40 mL/min — ABNORMAL LOW (ref >60)
GFR calc non Af Amer: 35 mL/min — ABNORMAL LOW (ref >60)
Glucose, Bld: 115 mg/dL — ABNORMAL HIGH (ref 70–99)
Potassium: 4.6 mmol/L (ref 3.5–5.1)
Sodium: 138 mmol/L (ref 135–145)

## 2020-06-20 NOTE — Patient Instructions (Signed)
Increase lasix to 40mg  once a day for 3 days (take potassium along with) then return to normal dosing  Follow up with Dr. [name] in [place] in 3 months

## 2020-06-25 ENCOUNTER — Telehealth: Payer: Self-pay

## 2020-06-25 NOTE — Telephone Encounter (Signed)
-----   Message from Oliver Barre, [place] sent at 06/20/2020 12:37 PM EDT ----- Regarding: Monitoring Hi Brandi Hunter,  This is a Dr [name] patient with a history of Mobitz II s/p PPM and chronic diastolic CHF. Her significant other also has a PPM and is followed by you. She is inquiring if she is a candidate to be followed as well. She has a Medtronic ADDRL1 Adapta.  Thanks!  [name]

## 2020-06-25 NOTE — Telephone Encounter (Signed)
ICM referral call to patient.  Advised I received message from afib clinic to call.  Advised I understand she was inquiring if she has a device that fluid levels can be monitored like her husband, Lovey Newcomer (in ICM progrma).  Advised her device does not have that capability and I am not able to monitor her fluid levels.  She thanked me for the call and follow up. Unable to enroll patient in [facility] clinic.

## 2020-06-28 ENCOUNTER — Ambulatory Visit (HOSPITAL_COMMUNITY)
Admission: RE | Admit: 2020-06-28 | Discharge: 2020-06-28 | Disposition: A | Discharge status: Home / Self Care | Payer: Medicare HMO | Source: Ambulatory Visit | Attending: Acute Care | Admitting: Acute Care

## 2020-06-28 ENCOUNTER — Other Ambulatory Visit: Payer: Self-pay

## 2020-06-28 DIAGNOSIS — Z122 Encounter for screening for malignant neoplasm of respiratory organs: Secondary | ICD-10-CM | POA: Insufficient documentation

## 2020-06-28 DIAGNOSIS — Z87891 Personal history of nicotine dependence: Secondary | ICD-10-CM | POA: Diagnosis present

## 2020-07-04 ENCOUNTER — Other Ambulatory Visit: Payer: Self-pay | Admitting: *Deleted

## 2020-07-04 DIAGNOSIS — Z87891 Personal history of nicotine dependence: Secondary | ICD-10-CM

## 2020-07-04 NOTE — Progress Notes (Signed)

## 2020-07-17 ENCOUNTER — Telehealth: Payer: Self-pay | Admitting: Cardiology

## 2020-07-17 NOTE — Telephone Encounter (Signed)
Please call patient IR:WERXV per Pt    [phone]    Thanks renee

## 2020-07-18 NOTE — Telephone Encounter (Signed)
Had gained several pounds last week.Two days ago weighed 2 lbs over nite (208 lbs) and she took lasix 40 mg.Today she is 206 lbs.She will continue to monitor weight.

## 2020-08-30 ENCOUNTER — Other Ambulatory Visit: Payer: Self-pay | Admitting: Cardiology

## 2020-09-06 ENCOUNTER — Ambulatory Visit (INDEPENDENT_AMBULATORY_CARE_PROVIDER_SITE_OTHER): Payer: Medicare HMO

## 2020-09-06 DIAGNOSIS — I441 Atrioventricular block, second degree: Secondary | ICD-10-CM

## 2020-09-07 LAB — CUP PACEART REMOTE DEVICE CHECK
Battery Impedance: 1717 Ohm
Battery Remaining Longevity: 31 mo
Battery Voltage: 2.75 V
Brady Statistic AP VP Percent: 76 %
Brady Statistic AP VS Percent: 2 %
Brady Statistic AS VP Percent: 9 %
Brady Statistic AS VS Percent: 13 %
Date Time Interrogation Session: 20211022120306
Implantable Lead Implant Date: 20111121
Implantable Lead Implant Date: 20111121
Implantable Lead Location: 753859
Implantable Lead Location: 753860
Implantable Lead Model: 4469
Implantable Lead Model: 4470
Implantable Lead Serial Number: 548226
Implantable Lead Serial Number: 687643
Implantable Pulse Generator Implant Date: 20111121
Lead Channel Impedance Value: 443 Ohm
Lead Channel Impedance Value: 494 Ohm
Lead Channel Pacing Threshold Amplitude: 0.625 V
Lead Channel Pacing Threshold Amplitude: 0.625 V
Lead Channel Pacing Threshold Pulse Width: 0.4 ms
Lead Channel Pacing Threshold Pulse Width: 0.4 ms
Lead Channel Setting Pacing Amplitude: 2.5 V
Lead Channel Setting Pacing Amplitude: 2.75 V
Lead Channel Setting Pacing Pulse Width: 0.4 ms
Lead Channel Setting Sensing Sensitivity: 4 mV

## 2020-09-11 NOTE — Progress Notes (Signed)
Remote pacemaker transmission.   

## 2020-09-16 ENCOUNTER — Encounter: Payer: Self-pay | Admitting: Cardiology

## 2020-09-16 ENCOUNTER — Ambulatory Visit: Payer: Medicare HMO | Admitting: Cardiology

## 2020-09-16 VITALS — BP 118/82 | HR 91 | Ht 66.0 in | Wt 208.0 lb

## 2020-09-16 DIAGNOSIS — I4819 Other persistent atrial fibrillation: Secondary | ICD-10-CM

## 2020-09-16 DIAGNOSIS — I25119 Atherosclerotic heart disease of native coronary artery with unspecified angina pectoris: Secondary | ICD-10-CM

## 2020-09-16 DIAGNOSIS — I5032 Chronic diastolic (congestive) heart failure: Secondary | ICD-10-CM

## 2020-09-16 DIAGNOSIS — I951 Orthostatic hypotension: Secondary | ICD-10-CM | POA: Diagnosis not present

## 2020-09-16 MED ORDER — SPIRONOLACTONE 25 MG PO TABS: 12.5000 mg | ORAL_TABLET | Freq: Every day | ORAL | 1 refills | Status: DC

## 2020-09-16 NOTE — Patient Instructions (Signed)
Your physician recommends that you schedule a follow-up appointment in: Brandi Hunter has recommended you make the following change in your medication:   DECREASE ALDACTONE 12.5 MG DAILY   Thank you for choosing [facility]!!

## 2020-09-16 NOTE — Progress Notes (Signed)
Cardiology Office Note  Date: 09/16/2020   ID: MEI SUITS, DOB 06-16-1941, MRN [id number]  PCP:  [name], FNP  Cardiologist:  Rozann Lesches, MD Electrophysiologist:  None   Chief Complaint  Patient presents with  . Cardiac follow-up    History of Present Illness: Brandi Hunter is a 79 y.o. female last seen in June by Mr. Leonides Sake NP.  She has had subsequent follow-up in the atrial fibrillation clinic as well.  She is here today with her oldest son.  She reports having recent trouble with orthostatic lightheadedness and at least 2 falls.  She does use a rolling walker.  She reports no major change in her medications at baseline including diuretic regimen.  Weight has fluctuated but is essentially at previous baseline.  She does not describe any sense of chest pain or palpitations.  She follows with Dr. [name], Medtronic pacemaker in place.  Recent device interrogation indicated normal function, persistent atrial fibrillation at baseline.  CHA2DS2-VASc score is 6, she continues on Eliquis for stroke prophylaxis.  Echocardiogram in June revealed LVEF 60 to 65% with moderate diastolic dysfunction, severely dilated left atrium, mild to moderate mitral regurgitation, RVSP estimated 47 mmHg.  I reviewed her medications today which are outlined below.  Past Medical History:  Diagnosis Date  . Asthma   . CAD (coronary artery disease)    a. catheterization in 03/2018 showing 90% ostial OM stenosis with medical management recommended at that time  . Chronic anticoagulation   . COPD (chronic obstructive pulmonary disease) ([place])   . DJD (degenerative joint disease)   . Essential hypertension   . GERD (gastroesophageal reflux disease)   . History of home oxygen therapy   . Hyperlipidemia   . Mobitz (type) II atrioventricular block   . Obesity   . Pacemaker    Implanted by Dr [name] (MDT) 10/06/10  . PAF (paroxysmal atrial fibrillation) ([place])   . Pancreatitis 2010 OR 2011  .  Sleep apnea    CPAP  . Type 2 diabetes mellitus ([place])     Past Surgical History:  Procedure Laterality Date  . CARDIOVERSION N/A 08/18/2019   Procedure: CARDIOVERSION;  Surgeon: Lelon Perla, MD;  Location: [facility] ENDOSCOPY;  Service: Cardiovascular;  Laterality: N/A;  . CATARACT EXTRACTION W/PHACO  11/02/2011   Procedure: CATARACT EXTRACTION PHACO AND INTRAOCULAR LENS PLACEMENT ([place]);  Surgeon: Williams Che;  Location: [facility] ORS;  Service: Ophthalmology;  Laterality: Right;  CDE=7.33  . CATARACT EXTRACTION W/PHACO  12/07/2011   Procedure: CATARACT EXTRACTION PHACO AND INTRAOCULAR LENS PLACEMENT (I[facility]);  Surgeon: Williams Che, MD;  Location: [facility] ORS;  Service: Ophthalmology;  Laterality: Left;  CDE 3.61  . COLONOSCOPY WITH PROPOFOL N/A 08/09/2014   Procedure: COLONOSCOPY WITH PROPOFOL;  Surgeon: Juanita Craver, MD;  Location: [facility] ENDOSCOPY;  Service: Endoscopy;  Laterality: N/A;  . CYSTOCELE REPAIR    . ESOPHAGOGASTRODUODENOSCOPY (EGD) WITH PROPOFOL N/A 08/09/2014   Procedure: ESOPHAGOGASTRODUODENOSCOPY (EGD) WITH PROPOFOL;  Surgeon: Juanita Craver, MD;  Location: [facility] ENDOSCOPY;  Service: Endoscopy;  Laterality: N/A;  . EYE SURGERY  11/01/2012   BOTH EYES CATARACTS  . INSERT / REPLACE / REMOVE PACEMAKER  10/06/10   MDT  implanted by Dr [name]  . KNEE ARTHROSCOPY     both  . LEFT HEART CATH AND CORONARY ANGIOGRAPHY N/A 03/24/2018   Procedure: LEFT HEART CATH AND CORONARY ANGIOGRAPHY;  Surgeon: Lorretta Harp, MD;  Location: [facility] CV LAB;  Service: Cardiovascular;  Laterality: N/A;  .  OVARY SURGERY     removal  . REPAIR RECTOCELE    . SIMPLE MASTECTOMY WITH AXILLARY SENTINEL NODE BIOPSY Left 01/09/2015   Procedure: Irrigation and Drainage Abcess left Axilla;  Surgeon: Jackolyn Confer, MD;  Location: [facility] ORS;  Service: General;  Laterality: Left;  . TONSILLECTOMY  AGE 21  . TOTAL ABDOMINAL HYSTERECTOMY  1971    Current Outpatient Medications  Medication Sig Dispense Refill  . albuterol  (PROAIR HFA) 108 (90 BASE) MCG/ACT inhaler Inhale 2 puffs into the lungs every 4 (four) hours as needed for wheezing.     Marland Kitchen apixaban (ELIQUIS) 5 MG TABS tablet Take 1 tablet (5 mg total) by mouth 2 (two) times daily. 180 tablet 3  . atorvastatin (LIPITOR) 40 MG tablet Take 40 mg by mouth at bedtime.     . budesonide-formoterol (SYMBICORT) 160-4.5 MCG/ACT inhaler Inhale 2 puffs into the lungs 2 (two) times daily.    . diclofenac Sodium (VOLTAREN) 1 % GEL Apply 2 g topically 2 (two) times daily as needed (for pain).     Marland Kitchen diltiazem (CARDIZEM CD) 120 MG 24 hr capsule Take 1 capsule (120 mg total) by mouth daily. 90 capsule 3  . DULoxetine (CYMBALTA) 30 MG capsule Take 30 mg by mouth at bedtime.     . famotidine (PEPCID) 20 MG tablet Take 20 mg by mouth 2 (two) times daily.     . fenofibrate 160 MG tablet Take 160 mg by mouth every evening.    . furosemide (LASIX) 20 MG tablet Take 1 tablet by mouth once daily 90 tablet 3  . ipratropium-albuterol (DUONEB) 0.5-2.5 (3) MG/3ML SOLN Take 3 mLs by nebulization 4 (four) times daily. 360 mL 1  . isosorbide mononitrate (IMDUR) 30 MG 24 hr tablet Take 1 tablet by mouth twice daily 60 tablet 0  . metFORMIN (GLUCOPHAGE) 1000 MG tablet Take 500 mg by mouth 2 (two) times daily.     . metoprolol succinate (TOPROL-XL) 100 MG 24 hr tablet Take 1 tablet by mouth once daily 90 tablet 3  . nitroGLYCERIN (NITROSTAT) 0.4 MG SL tablet DISSOLVE ONE TABLET UNDER THE TONGUE EVERY 5 MINUTES AS NEEDED FOR CHEST PAIN.  DO NOT EXCEED A TOTAL OF 3 DOSES IN 15 MINUTES 25 tablet 1  . OXYGEN Inhale 2 L into the lungs continuous.    . potassium chloride SA (KLOR-CON) 20 MEQ tablet Take 20 mEq by mouth 2 (two) times daily.     . Probiotic Product (ALIGN PO) Take 1 capsule by mouth daily.     Marland Kitchen spironolactone (ALDACTONE) 25 MG tablet Take 0.5 tablets (12.5 mg total) by mouth daily. 45 tablet 1   No current facility-administered medications for this visit.   Allergies:  Bee venom and  Latex   ROS: No syncope.  Physical Exam: VS:  BP 118/82   Pulse 91   Ht 5\' 6"  (1.676 m)   Wt 208 lb (94.3 kg)   SpO2 98%   BMI 33.57 kg/m , BMI Body mass index is 33.57 kg/m.  Wt Readings from Last 3 Encounters:  09/16/20 208 lb (94.3 kg)  06/20/20 215 lb 12.8 oz (97.9 kg)  05/14/20 207 lb (93.9 kg)    General: Elderly woman, appears comfortable at rest.  Using a rolling walker. HEENT: Conjunctiva and lids normal, wearing a mask. Neck: Supple, no elevated JVP or carotid bruits, no thyromegaly. Lungs: Decreased breath sounds without wheezing, nonlabored breathing at rest. Cardiac: Regular rate and rhythm, no S3, soft systolic murmur.  Extremities: Compression stockings in place.  ECG:  An ECG dated 06/20/2020 was personally reviewed today and demonstrated:  Ventricular paced rhythm with underlying atrial fibrillation.  Recent Labwork: 05/05/2020: ALT 31; AST 39; B Natriuretic Peptide 1,222.0; Magnesium 1.4 05/07/2020: Hemoglobin 9.3; Platelets 352 06/20/2020: BUN 24; Creatinine, Ser 1.45; Potassium 4.6; Sodium 138   Other Studies Reviewed Today:  Echocardiogram 05/06/2020: 1. Left ventricular ejection fraction, by estimation, is 60 to 65%. The  left ventricle has normal function. The left ventricle has no regional  wall motion abnormalities. There is mild left ventricular hypertrophy.  Left ventricular diastolic parameters  are consistent with Grade II diastolic dysfunction (pseudonormalization).  Elevated left atrial pressure.  2. Right ventricular systolic function is normal. The right ventricular  size is normal. There is moderately elevated pulmonary artery systolic  pressure.  3. Left atrial size was severely dilated.  4. Right atrial size was severely dilated.  5. The mitral valve is normal in structure. Mild to moderate mitral valve  regurgitation. No evidence of mitral stenosis.  6. The aortic valve has an indeterminant number of cusps. Aortic valve   regurgitation is not visualized. No aortic stenosis is present.  7. The inferior vena cava is normal in size with greater than 50%  respiratory variability, suggesting right atrial pressure of 3 mmHg.   Assessment and Plan:  1.  Orthostatic hypotension.  Baseline blood pressure controlled.  She has just started using compression stockings, I asked her to continue this during waking hours.  We will reduce her Aldactone to 12.5 mg daily, may need to discontinue.  She is on Toprol-XL and Cardizem CD for rate control of atrial fibrillation, continue current doses.  2.  Persistent atrial fibrillation with CHA2DS2-VASc score of 6.  Continue Eliquis for stroke prophylaxis.  3.  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  Continue to follow with Dr. [name].  4.  Chronic diastolic heart failure, LVEF 60 to 65% with grade 2 diastolic dysfunction by echocardiogram in June.  She remains on Lasix 20 mg daily for now.  Weight is at previous baseline.  Medication Adjustments/Labs and Tests Ordered: Current medicines are reviewed at length with the patient today.  Concerns regarding medicines are outlined above.   Tests Ordered: No orders of the defined types were placed in this encounter.   Medication Changes: Meds ordered this encounter  Medications  . spironolactone (ALDACTONE) 25 MG tablet    Sig: Take 0.5 tablets (12.5 mg total) by mouth daily.    Dispense:  45 tablet    Refill:  1    DOSE DECREASE 09/16/2020    Disposition:  Follow up 3 months in the [place] office.  Signed, Satira Sark, MD, [facility] 09/16/2020 12:03 PM    [facility] [place], [place], [place] [address] Phone: [phone]; Fax: [phone]

## 2020-09-18 ENCOUNTER — Other Ambulatory Visit: Payer: Self-pay | Admitting: Nurse Practitioner

## 2020-09-18 ENCOUNTER — Other Ambulatory Visit: Payer: Self-pay | Admitting: Cardiology

## 2020-09-18 ENCOUNTER — Other Ambulatory Visit: Payer: Self-pay | Admitting: Internal Medicine

## 2020-09-19 ENCOUNTER — Telehealth: Payer: Self-pay | Admitting: Cardiology

## 2020-09-19 NOTE — Telephone Encounter (Signed)
Patient called and states she checked her BP at 1:10pm and it was 74/47 , checked it a few minutes later ar 1:20 pm and it was 111/68, patient heart rate is 76 and she is very lightheaded, and unsteady.

## 2020-09-19 NOTE — Telephone Encounter (Signed)
This is a [place] pt, Dr. McDowell 

## 2020-09-19 NOTE — Telephone Encounter (Signed)
Patent states sx's are now resolved, she is in bed resting and will keep an eye on her BP.She will go to the ED if sx's return. Blood sugar was 135 mg/dl and she is drinking water.

## 2020-09-29 ENCOUNTER — Other Ambulatory Visit: Payer: Self-pay | Admitting: Cardiology

## 2020-10-18 IMAGING — CR DG CHEST 1V PORT
1 series · 1 of 1 positions shown · non-contrast
Comparison: Chest radiograph 07/18/2018, CT chest 06/05/2019

CLINICAL DATA: Shortness of breath. Low O2 sats.

EXAM:
PORTABLE CHEST 1 VIEW

[portable]
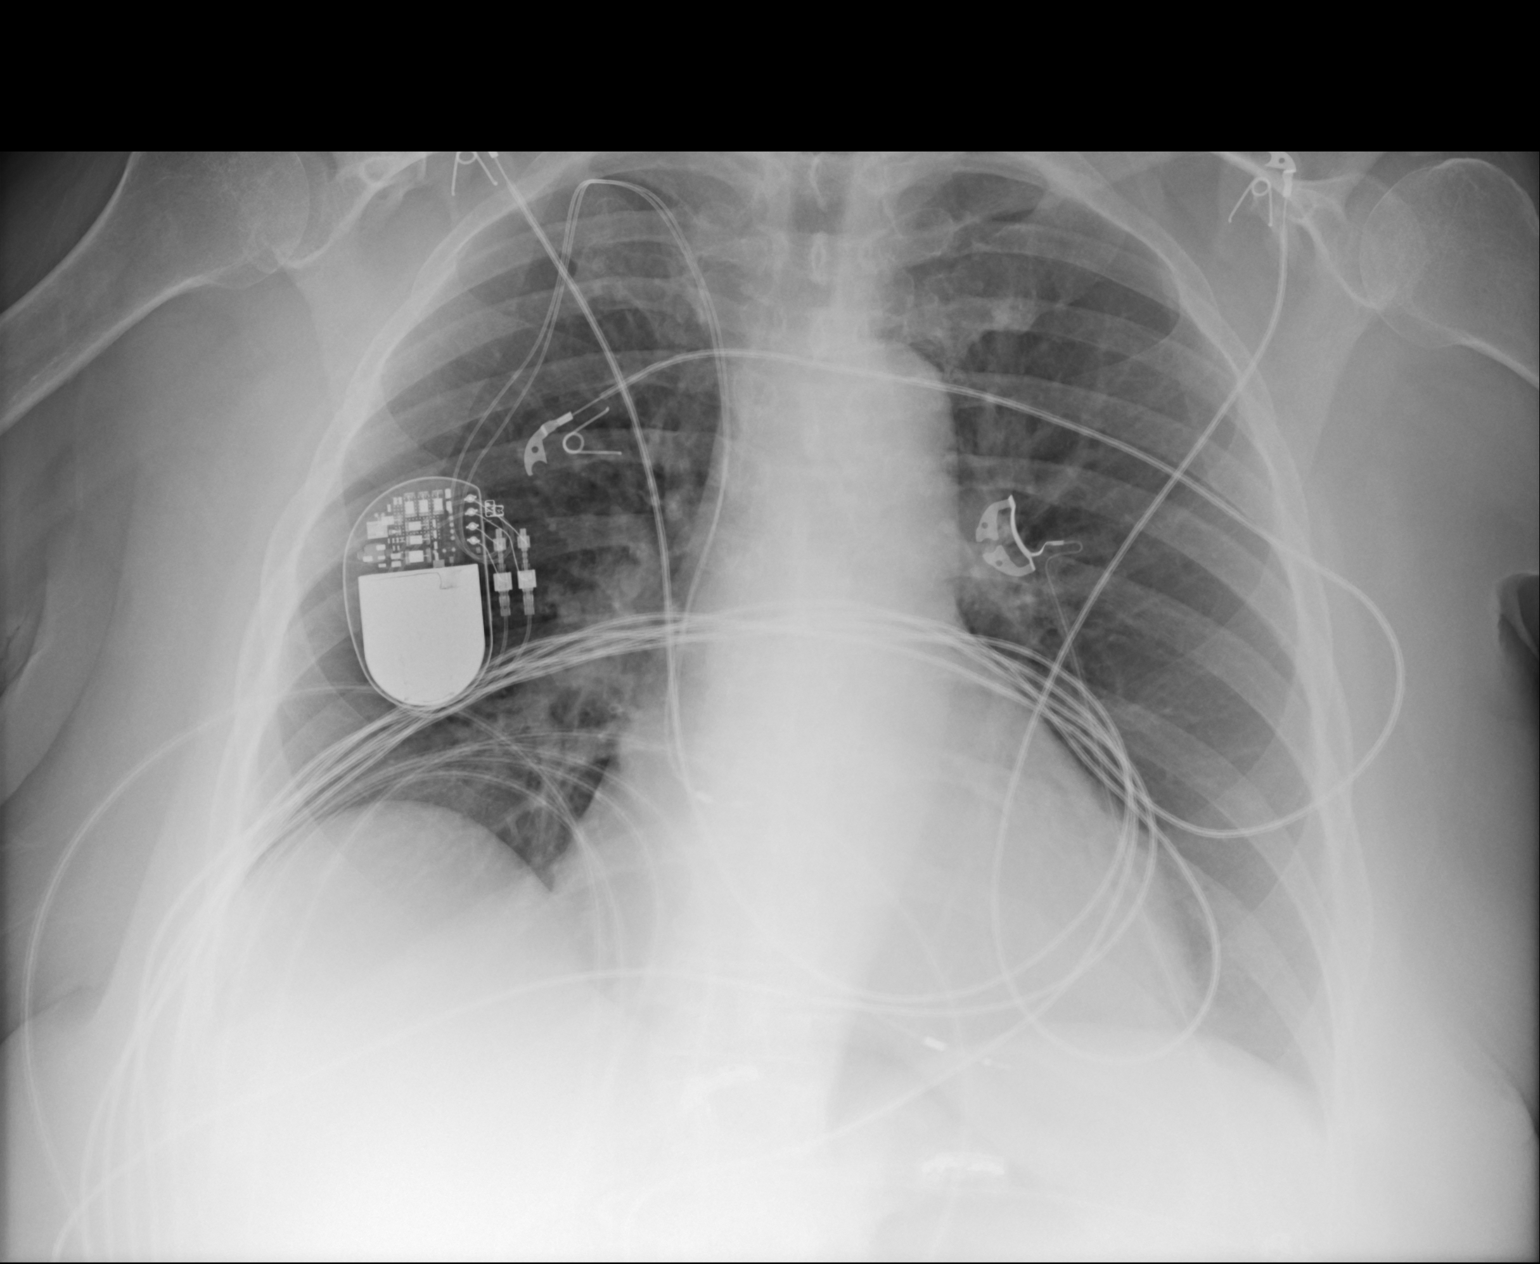

[1 of 1 positions shown; findings below may reference images not displayed]

FINDINGS: Stable cardiomediastinal contours with enlarged heart size.
Unchanged position of a right chest ICD. Persistent elevation of the
right hemidiaphragm. The lungs are clear. No pneumothorax or large
pleural effusion. No acute finding in the visualized skeleton.
IMPRESSION: No acute cardiopulmonary finding.

## 2020-12-05 LAB — CUP PACEART REMOTE DEVICE CHECK
Battery Impedance: 1776 Ohm
Battery Remaining Longevity: 30 mo
Battery Voltage: 2.74 V
Brady Statistic AP VP Percent: 75 %
Brady Statistic AP VS Percent: 2 %
Brady Statistic AS VP Percent: 10 %
Brady Statistic AS VS Percent: 13 %
Date Time Interrogation Session: 20220120133917
Implantable Lead Implant Date: 20111121
Implantable Lead Implant Date: 20111121
Implantable Lead Location: 753859
Implantable Lead Location: 753860
Implantable Lead Model: 4469
Implantable Lead Model: 4470
Implantable Lead Serial Number: 548226
Implantable Lead Serial Number: 687643
Implantable Pulse Generator Implant Date: 20111121
Lead Channel Impedance Value: 433 Ohm
Lead Channel Impedance Value: 487 Ohm
Lead Channel Pacing Threshold Amplitude: 0.625 V
Lead Channel Pacing Threshold Amplitude: 0.75 V
Lead Channel Pacing Threshold Pulse Width: 0.4 ms
Lead Channel Pacing Threshold Pulse Width: 0.4 ms
Lead Channel Setting Pacing Amplitude: 2.5 V
Lead Channel Setting Pacing Amplitude: 2.75 V
Lead Channel Setting Pacing Pulse Width: 0.4 ms
Lead Channel Setting Sensing Sensitivity: 5.6 mV

## 2020-12-06 ENCOUNTER — Ambulatory Visit (INDEPENDENT_AMBULATORY_CARE_PROVIDER_SITE_OTHER): Payer: Medicare HMO

## 2020-12-06 DIAGNOSIS — I441 Atrioventricular block, second degree: Secondary | ICD-10-CM

## 2020-12-18 NOTE — Progress Notes (Signed)
Remote pacemaker transmission.   

## 2020-12-20 ENCOUNTER — Ambulatory Visit: Payer: Medicare HMO | Admitting: Internal Medicine

## 2020-12-20 ENCOUNTER — Encounter: Payer: Self-pay | Admitting: Internal Medicine

## 2020-12-20 VITALS — BP 132/84 | HR 76 | Ht 66.0 in | Wt 211.0 lb

## 2020-12-20 DIAGNOSIS — I4819 Other persistent atrial fibrillation: Secondary | ICD-10-CM

## 2020-12-20 DIAGNOSIS — I441 Atrioventricular block, second degree: Secondary | ICD-10-CM | POA: Diagnosis not present

## 2020-12-20 DIAGNOSIS — I10 Essential (primary) hypertension: Secondary | ICD-10-CM

## 2020-12-20 LAB — CUP PACEART INCLINIC DEVICE CHECK
Date Time Interrogation Session: 20220204124017
Implantable Lead Implant Date: 20111121
Implantable Lead Implant Date: 20111121
Implantable Lead Location: 753859
Implantable Lead Location: 753860
Implantable Lead Model: 4469
Implantable Lead Model: 4470
Implantable Lead Serial Number: 548226
Implantable Lead Serial Number: 687643
Implantable Pulse Generator Implant Date: 20111121

## 2020-12-20 NOTE — Patient Instructions (Signed)
Medication Instructions:  Continue all current medications.  Labwork: none  Testing/Procedures: none  Follow-Up: 1 year   Any Other Special Instructions Will Be Listed Below (If Applicable).  If you need a refill on your cardiac medications before your next appointment, please call your pharmacy.  

## 2020-12-20 NOTE — Progress Notes (Signed)
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 female who presents today for routine electrophysiology followup.  Since last being seen in our clinic, the patient reports doing reasonably well.  She has chronic SOB and is wearing O2 today.  She states "I am tired".  Today, she denies symptoms of palpitations, chest pain,  lower extremity edema, dizziness, presyncope, or syncope.  The patient is otherwise without complaint today.   Past Medical History:  Diagnosis Date  . Asthma   . CAD (coronary artery disease)    a. catheterization in 03/2018 showing 90% ostial OM stenosis with medical management recommended at that time  . Chronic anticoagulation   . COPD (chronic obstructive pulmonary disease) ([place])   . DJD (degenerative joint disease)   . Essential hypertension   . GERD (gastroesophageal reflux disease)   . History of home oxygen therapy   . Hyperlipidemia   . Mobitz (type) II atrioventricular block   . Obesity   . Pacemaker    Implanted by Dr [name] (MDT) 10/06/10  . PAF (paroxysmal atrial fibrillation) ([place])   . Pancreatitis 2010 OR 2011  . Sleep apnea    CPAP  . Type 2 diabetes mellitus ([place])    Past Surgical History:  Procedure Laterality Date  . CARDIOVERSION N/A 08/18/2019   Procedure: CARDIOVERSION;  Surgeon: Lelon Perla, MD;  Location: [facility] ENDOSCOPY;  Service: Cardiovascular;  Laterality: N/A;  . CATARACT EXTRACTION W/PHACO  11/02/2011   Procedure: CATARACT EXTRACTION PHACO AND INTRAOCULAR LENS PLACEMENT ([place]);  Surgeon: Williams Che;  Location: [facility] ORS;  Service: Ophthalmology;  Laterality: Right;  CDE=7.33  . CATARACT EXTRACTION W/PHACO  12/07/2011   Procedure: CATARACT EXTRACTION PHACO AND INTRAOCULAR LENS PLACEMENT (IOC);  Surgeon: Williams Che, MD;  Location: [facility] ORS;  Service: Ophthalmology;  Laterality: Left;  CDE 3.61  . COLONOSCOPY WITH PROPOFOL N/A 08/09/2014   Procedure: COLONOSCOPY WITH PROPOFOL;  Surgeon:  Juanita Craver, MD;  Location: [facility] ENDOSCOPY;  Service: Endoscopy;  Laterality: N/A;  . CYSTOCELE REPAIR    . ESOPHAGOGASTRODUODENOSCOPY (EGD) WITH PROPOFOL N/A 08/09/2014   Procedure: ESOPHAGOGASTRODUODENOSCOPY (EGD) WITH PROPOFOL;  Surgeon: Juanita Craver, MD;  Location: [facility] ENDOSCOPY;  Service: Endoscopy;  Laterality: N/A;  . EYE SURGERY  11/01/2012   BOTH EYES CATARACTS  . INSERT / REPLACE / REMOVE PACEMAKER  10/06/10   MDT  implanted by Dr [name]  . KNEE ARTHROSCOPY     both  . LEFT HEART CATH AND CORONARY ANGIOGRAPHY N/A 03/24/2018   Procedure: LEFT HEART CATH AND CORONARY ANGIOGRAPHY;  Surgeon: Lorretta Harp, MD;  Location: [place] CV LAB;  Service: Cardiovascular;  Laterality: N/A;  . OVARY SURGERY     removal  . REPAIR RECTOCELE    . SIMPLE MASTECTOMY WITH AXILLARY SENTINEL NODE BIOPSY Left 01/09/2015   Procedure: Irrigation and Drainage Abcess left Axilla;  Surgeon: Jackolyn Confer, MD;  Location: [facility] ORS;  Service: General;  Laterality: Left;  . TONSILLECTOMY  AGE 26  . TOTAL ABDOMINAL HYSTERECTOMY  1971    ROS- all systems are reviewed and negative except as per HPI above  Current Outpatient Medications  Medication Sig Dispense Refill  . albuterol (VENTOLIN HFA) 108 (90 Base) MCG/ACT inhaler Inhale 2 puffs into the lungs every 4 (four) hours as needed for wheezing.     Marland Kitchen atorvastatin (LIPITOR) 40 MG tablet Take 40 mg by mouth at bedtime.     . budesonide-formoterol (  SYMBICORT) 160-4.5 MCG/ACT inhaler Inhale 2 puffs into the lungs 2 (two) times daily.    . diclofenac Sodium (VOLTAREN) 1 % GEL Apply 2 g topically 2 (two) times daily as needed (for pain).     Marland Kitchen diltiazem (CARDIZEM CD) 120 MG 24 hr capsule Take 1 capsule by mouth once daily 90 capsule 1  . DULoxetine (CYMBALTA) 30 MG capsule Take 30 mg by mouth at bedtime.     Marland Kitchen ELIQUIS 5 MG TABS tablet Take 1 tablet by mouth twice daily 180 tablet 3  . famotidine (PEPCID) 20 MG tablet Take 20 mg by mouth 2 (two) times daily.      . fenofibrate 160 MG tablet Take 160 mg by mouth every evening.    . furosemide (LASIX) 20 MG tablet Take 1 tablet by mouth once daily 90 tablet 3  . gabapentin (NEURONTIN) 100 MG capsule Take 1 capsule at bedtime x 3 days, then 2 at bedtime x 3 days then 3 at bedtime    . ipratropium-albuterol (DUONEB) 0.5-2.5 (3) MG/3ML SOLN Take 3 mLs by nebulization 4 (four) times daily. 360 mL 1  . isosorbide mononitrate (IMDUR) 30 MG 24 hr tablet Take 1 tablet by mouth twice daily 60 tablet 0  . metFORMIN (GLUCOPHAGE) 1000 MG tablet Take 500 mg by mouth 2 (two) times daily.     . methylPREDNISolone (MEDROL DOSEPAK) 4 MG TBPK tablet See admin instructions.    . metoprolol succinate (TOPROL-XL) 100 MG 24 hr tablet Take 1 tablet by mouth once daily 90 tablet 1  . mupirocin ointment (BACTROBAN) 2 % Apply to affected area 3 times daily    . nitroGLYCERIN (NITROSTAT) 0.4 MG SL tablet DISSOLVE ONE TABLET UNDER THE TONGUE EVERY 5 MINUTES AS NEEDED FOR CHEST PAIN.  DO NOT EXCEED A TOTAL OF 3 DOSES IN 15 MINUTES 25 tablet 3  . OXYGEN Inhale 2 L into the lungs continuous.    . potassium chloride SA (KLOR-CON) 20 MEQ tablet Take 20 mEq by mouth 2 (two) times daily.     . Probiotic Product (ALIGN PO) Take 1 capsule by mouth daily.    Marland Kitchen spironolactone (ALDACTONE) 25 MG tablet Take 0.5 tablets (12.5 mg total) by mouth daily. 45 tablet 1   No current facility-administered medications for this visit.    Physical Exam: Vitals:   12/20/20 1143  BP: 132/84  Pulse: 76  SpO2: 98%  Weight: 211 lb (95.7 kg)  Height: 5\' 6"  (1.676 m)    GEN- The patient is chronically ill appearing, alert and oriented x 3 today.   Head- normocephalic, atraumatic Eyes-  Sclera clear, conjunctiva pink Ears- hearing intact Oropharynx- clear Lungs-   normal work of breathing, wearing O2 Chest- pacemaker pocket is well healed Heart- Regular rate and rhythm (paced) GI- soft  Extremities- no clubbing, cyanosis, + edema  Pacemaker  interrogation- reviewed in detail today,  See PACEART report   Assessment and Plan:  1. 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 Persistent afib chads2vasc score is 6.  She is on eliquis afib burden is 100 % She is rate controlled  3. HTN Stable No change required today  4. CAD No ischemic symptoms  5 chronic diastolic dysfunction Stable No change required today  Risks, benefits and potential toxicities for medications prescribed and/or refilled reviewed with patient today.   Return in a year  Thompson Grayer MD,  [facility] 12/20/2020 11:52 AM

## 2020-12-25 ENCOUNTER — Encounter: Payer: Self-pay | Admitting: Cardiology

## 2020-12-25 ENCOUNTER — Ambulatory Visit: Payer: Medicare HMO | Admitting: Cardiology

## 2020-12-25 VITALS — BP 118/68 | HR 80 | Ht 66.0 in | Wt 207.4 lb

## 2020-12-25 DIAGNOSIS — I4819 Other persistent atrial fibrillation: Secondary | ICD-10-CM | POA: Diagnosis not present

## 2020-12-25 DIAGNOSIS — I25119 Atherosclerotic heart disease of native coronary artery with unspecified angina pectoris: Secondary | ICD-10-CM

## 2020-12-25 DIAGNOSIS — Z950 Presence of cardiac pacemaker: Secondary | ICD-10-CM | POA: Diagnosis not present

## 2020-12-25 DIAGNOSIS — I951 Orthostatic hypotension: Secondary | ICD-10-CM

## 2020-12-25 NOTE — Patient Instructions (Signed)
Medication Instructions:  Continue all current medications.   Labwork: none  Testing/Procedures: none  Follow-Up: 6 months   Any Other Special Instructions Will Be Listed Below (If Applicable).   If you need a refill on your cardiac medications before your next appointment, please call your pharmacy.  

## 2020-12-25 NOTE — Progress Notes (Signed)
Cardiology Office Note  Date: 12/25/2020   ID: Brandi [place], DOB 01/18/1941, MRN [id number]  PCP:  [name], FNP  Cardiologist:  Rozann Lesches, MD Electrophysiologist:  Thompson Grayer, MD   Chief Complaint  Patient presents with  . Cardiac follow-up    History of Present Illness: Brandi [place] is a 80 y.o. female last seen in November 2021.  She is here with her son for a follow-up visit.  Reports no significant change since last encounter, no progressive orthostatic symptoms.  She follows with Dr. [name], [employer]tr[employer] pacemaker in place.  I reviewed his recent note from February 4.  Recent device interrogation revealed normal function.  Aldactone was reduced to 12.5 mg daily at the last visit.  I did review her interval lab work which is noted below.  We went over her medications which are otherwise unchanged from a cardiac perspective.  Past Medical History:  Diagnosis Date  . Asthma   . CAD (coronary artery disease)    a. catheterization in 03/2018 showing 90% ostial OM stenosis with medical management recommended at that time  . Chronic anticoagulation   . COPD (chronic obstructive pulmonary disease) ([place])   . DJD (degenerative joint disease)   . Essential hypertension   . GERD (gastroesophageal reflux disease)   . History of home oxygen therapy   . Hyperlipidemia   . Mobitz (type) II atrioventricular block   . Obesity   . Pacemaker    Implanted by Dr [name] (MDT) 10/06/10  . PAF (paroxysmal atrial fibrillation) ([place])   . Pancreatitis 2010 OR 2011  . Sleep apnea    CPAP  . Type 2 diabetes mellitus ([place])     Past Surgical History:  Procedure Laterality Date  . CARDIOVERSION N/A 08/18/2019   Procedure: CARDIOVERSION;  Surgeon: Lelon Perla, MD;  Location[facility] ENDOSCOPY;  Service: Cardiovascular;  Laterality: N/A;  . CATARACT EXTRACTION W/PHACO  11/02/2011   Procedure: CATARACT EXTRACTION PHACO AND INTRAOCULAR LENS PLACEMENT ([place]);  Surgeon: Williams Che;  Location: [facility] ORS;  Service: Ophthalmology;  Laterality: Right;  CDE=7.33  . CATARACT EXTRACTION W/PHACO  12/07/2011   Procedure: CATARACT EXTRACTION PHACO AND INTRAOCULAR LENS PLACEMENT (I[facility]);  Surgeon: Williams Che, MD;  Location: [facility] ORS;  Service: Ophthalmology;  Laterality: Left;  CDE 3.61  . COLONOSCOPY WITH PROPOFOL N/A 08/09/2014   Procedure: COLONOSCOPY WITH PROPOFOL;  Surgeon: Juanita Craver, MD;  Location[facility] ENDOSCOPY;  Service: Endoscopy;  Laterality: N/A;  . CYSTOCELE REPAIR    . ESOPHAGOGASTRODUODENOSCOPY (EGD) WITH PROPOFOL N/A 08/09/2014   Procedure: ESOPHAGOGASTRODUODENOSCOPY (EGD) WITH PROPOFOL;  Surgeon: Juanita Craver, MD;  Location[facility] ENDOSCOPY;  Service: Endoscopy;  Laterality: N/A;  . EYE SURGERY  11/01/2012   BOTH EYES CATARACTS  . INSERT / REPLACE / REMOVE PACEMAKER  10/06/10   MDT  implanted by Dr [name]  . KNEE ARTHROSCOPY     both  . LEFT HEART CATH AND CORONARY ANGIOGRAPHY N/A 03/24/2018   Procedure: LEFT HEART CATH AND CORONARY ANGIOGRAPHY;  Surgeon: Lorretta Harp, MD;  Location: [place] CV LAB;  Service: Cardiovascular;  Laterality: N/A;  . OVARY SURGERY     removal  . REPAIR RECTOCELE    . SIMPLE MASTECTOMY WITH AXILLARY SENTINEL NODE BIOPSY Left 01/09/2015   Procedure: Irrigation and Drainage Abcess left Axilla;  Surgeon: Jackolyn Confer, MD;  Location: [facility] ORS;  Service: General;  Laterality: Left;  . TONSILLECTOMY  AGE 60  . TOTAL ABDOMINAL HYSTERECTOMY  1971  Current Outpatient Medications  Medication Sig Dispense Refill  . albuterol (VENTOLIN HFA) 108 (90 Base) MCG/ACT inhaler Inhale 2 puffs into the lungs every 4 (four) hours as needed for wheezing.     Marland Kitchen atorvastatin (LIPITOR) 40 MG tablet Take 40 mg by mouth at bedtime.     . budesonide-formoterol (SYMBICORT) 160-4.5 MCG/ACT inhaler Inhale 2 puffs into the lungs 2 (two) times daily.    . diclofenac Sodium (VOLTAREN) 1 % GEL Apply 2 g topically 2 (two) times daily as needed (for pain).      Marland Kitchen diltiazem (CARDIZEM CD) 120 MG 24 hr capsule Take 1 capsule by mouth once daily 90 capsule 1  . DULoxetine (CYMBALTA) 30 MG capsule Take 30 mg by mouth at bedtime.     Marland Kitchen ELIQUIS 5 MG TABS tablet Take 1 tablet by mouth twice daily 180 tablet 3  . famotidine (PEPCID) 20 MG tablet Take 20 mg by mouth 2 (two) times daily.     . fenofibrate 160 MG tablet Take 160 mg by mouth every evening.    . furosemide (LASIX) 20 MG tablet Take 1 tablet by mouth once daily 90 tablet 3  . gabapentin (NEURONTIN) 100 MG capsule Take 1 capsule at bedtime x 3 days, then 2 at bedtime x 3 days then 3 at bedtime    . ipratropium-albuterol (DUONEB) 0.5-2.5 (3) MG/3ML SOLN Take 3 mLs by nebulization 4 (four) times daily. 360 mL 1  . isosorbide mononitrate (IMDUR) 30 MG 24 hr tablet Take 1 tablet by mouth twice daily 60 tablet 0  . metFORMIN (GLUCOPHAGE) 1000 MG tablet Take 500 mg by mouth 2 (two) times daily.     . metoprolol succinate (TOPROL-XL) 100 MG 24 hr tablet Take 1 tablet by mouth once daily 90 tablet 1  . mupirocin ointment (BACTROBAN) 2 % Apply to affected area 3 times daily    . nitroGLYCERIN (NITROSTAT) 0.4 MG SL tablet DISSOLVE ONE TABLET UNDER THE TONGUE EVERY 5 MINUTES AS NEEDED FOR CHEST PAIN.  DO NOT EXCEED A TOTAL OF 3 DOSES IN 15 MINUTES 25 tablet 3  . OXYGEN Inhale 2 L into the lungs continuous.    . potassium chloride SA (KLOR-CON) 20 MEQ tablet Take 20 mEq by mouth 2 (two) times daily.     . Probiotic Product (ALIGN PO) Take 1 capsule by mouth daily.    Marland Kitchen spironolactone (ALDACTONE) 25 MG tablet Take 0.5 tablets (12.5 mg total) by mouth daily. 45 tablet 1   No current facility-administered medications for this visit.   Allergies:  Bee venom and Latex   ROS: No palpitations or syncope.  Physical Exam: VS:  BP 118/68   Pulse 80   Ht 5\' 6"  (1.676 m)   Wt 207 lb 6.4 oz (94.1 kg)   SpO2 97% Comment: 2 L/min 24/7  BMI 33.48 kg/m , BMI Body mass index is 33.48 kg/m.  Wt Readings from Last 3  Encounters:  12/25/20 207 lb 6.4 oz (94.1 kg)  12/20/20 211 lb (95.7 kg)  09/16/20 208 lb (94.3 kg)    General: Elderly woman, wearing oxygen via nasal cannula. HEENT: Conjunctiva and lids normal, wearing a mask. Neck: Supple, no elevated JVP or carotid bruits, no thyromegaly. Lungs: Decreased breath sounds. Cardiac: Regular rate and rhythm, no S3, soft systolic murmur. Extremities: Compression stockings.  ECG:  An ECG dated 06/20/2020 was personally reviewed today and demonstrated:  Ventricular paced rhythm with underlying atrial fibrillation.  Recent Labwork: 05/05/2020: ALT 31; AST 39;  B Natriuretic Peptide 1,222.0; Magnesium 1.4 05/07/2020: Hemoglobin 9.3; Platelets 352 06/20/2020: BUN 24; Creatinine, Ser 1.45; Potassium 4.6; Sodium 138  October 2021: Cholesterol 130, triglycerides 121, HDL 35, LDL 73, BUN 35, creatinine 1.64, potassium 4.7, AST 19, ALT 12, hemoglobin 11.4, platelets 276 February 2022: Hemoglobin A1c 6.2%  Other Studies Reviewed Today:  Echocardiogram 05/06/2020: 1. Left ventricular ejection fraction, by estimation, is 60 to 65%. The  left ventricle has normal function. The left ventricle has no regional  wall motion abnormalities. There is mild left ventricular hypertrophy.  Left ventricular diastolic parameters  are consistent with Grade II diastolic dysfunction (pseudonormalization).  Elevated left atrial pressure.  2. Right ventricular systolic function is normal. The right ventricular  size is normal. There is moderately elevated pulmonary artery systolic  pressure.  3. Left atrial size was severely dilated.  4. Right atrial size was severely dilated.  5. The mitral valve is normal in structure. Mild to moderate mitral valve  regurgitation. No evidence of mitral stenosis.  6. The aortic valve has an indeterminant number of cusps. Aortic valve  regurgitation is not visualized. No aortic stenosis is present.  7. The inferior vena cava is normal in size  with greater than 50%  respiratory variability, suggesting right atrial pressure of 3 mmHg.   Assessment and Plan:  1.  Persistent atrial fibrillation, CHA2DS2-VASc score is 6.  Asymptomatic in terms of palpitations and managing with heart rate control strategy and anticoagulation.  Continue Cardizem CD, Toprol-XL, and Eliquis.  Lab work from October reviewed.  2.  History of orthostatic hypotension, currently stable.  Continue compression stockings.  3.  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  She continues to follow with Dr. [name], recent device interrogation revealed normal function.  Medication Adjustments/Labs and Tests Ordered: Current medicines are reviewed at length with the patient today.  Concerns regarding medicines are outlined above.   Tests Ordered: No orders of the defined types were placed in this encounter.   Medication Changes: No orders of the defined types were placed in this encounter.   Disposition:  Follow up 6 months in the [place] office.  Signed, Satira Sark, MD, [facility] 12/25/2020 10:46 AM    [facility] [place], [place], [place] [address] Phone: [phone]; Fax: [phone]

## 2021-02-26 ENCOUNTER — Other Ambulatory Visit: Payer: Self-pay

## 2021-02-26 ENCOUNTER — Encounter: Payer: Self-pay | Admitting: Physical Therapy

## 2021-02-26 ENCOUNTER — Ambulatory Visit: Payer: Medicare HMO | Attending: Anesthesiology | Admitting: Physical Therapy

## 2021-02-26 DIAGNOSIS — R293 Abnormal posture: Secondary | ICD-10-CM | POA: Insufficient documentation

## 2021-02-26 DIAGNOSIS — M542 Cervicalgia: Secondary | ICD-10-CM | POA: Diagnosis present

## 2021-02-26 NOTE — Therapy (Signed)
[facility] [place], [place], [address] Phone: [phone]   Fax:  [phone]  Physical Therapy Evaluation  Patient Details  Name: Brandi Hunter MRN: [id number] Date of Birth: 02/14/1941 Referring Provider (PT): Dorene Ar MD   Encounter Date: 02/26/2021   PT End of Session - 02/26/21 1609    Visit Number 1    Number of Visits 12    Date for PT Re-Evaluation 04/09/21    Authorization Type FOTO AT LEAST EVERY 5TH VISIT.  PROGRESS NOTE AT 10TH VISIT.  KX MODIFIER AFTER 15 VISITS.    PT Start Time 0900    PT Stop Time [id number]    PT Time Calculation (min) 38 min    Activity Tolerance Patient tolerated treatment well    Behavior During Therapy WFL for tasks assessed/performed           Past Medical History:  Diagnosis Date  . Asthma   . CAD (coronary artery disease)    a. catheterization in 03/2018 showing 90% ostial OM stenosis with medical management recommended at that time  . Chronic anticoagulation   . COPD (chronic obstructive pulmonary disease) ([place])   . DJD (degenerative joint disease)   . Essential hypertension   . GERD (gastroesophageal reflux disease)   . History of home oxygen therapy   . Hyperlipidemia   . Mobitz (type) II atrioventricular block   . Obesity   . Pacemaker    Implanted by Dr [name] (MDT) 10/06/10  . PAF (paroxysmal atrial fibrillation) ([place])   . Pancreatitis 2010 OR 2011  . Sleep apnea    CPAP  . Type 2 diabetes mellitus ([place])     Past Surgical History:  Procedure Laterality Date  . CARDIOVERSION N/A 08/18/2019   Procedure: CARDIOVERSION;  Surgeon: Lelon Perla, MD;  Location: [facility] ENDOSCOPY;  Service: Cardiovascular;  Laterality: N/A;  . CATARACT EXTRACTION W/PHACO  11/02/2011   Procedure: CATARACT EXTRACTION PHACO AND INTRAOCULAR LENS PLACEMENT ([place]);  Surgeon: Williams Che;  Location: [facility] ORS;  Service: Ophthalmology;  Laterality: Right;  CDE=7.33  . CATARACT EXTRACTION  W/PHACO  12/07/2011   Procedure: CATARACT EXTRACTION PHACO AND INTRAOCULAR LENS PLACEMENT (IOC);  Surgeon: Williams Che, MD;  Location: [facility] ORS;  Service: Ophthalmology;  Laterality: Left;  CDE 3.61  . COLONOSCOPY WITH PROPOFOL N/A 08/09/2014   Procedure: COLONOSCOPY WITH PROPOFOL;  Surgeon: Juanita Craver, MD;  Location: [facility] ENDOSCOPY;  Service: Endoscopy;  Laterality: N/A;  . CYSTOCELE REPAIR    . ESOPHAGOGASTRODUODENOSCOPY (EGD) WITH PROPOFOL N/A 08/09/2014   Procedure: ESOPHAGOGASTRODUODENOSCOPY (EGD) WITH PROPOFOL;  Surgeon: Juanita Craver, MD;  Location: [facility] ENDOSCOPY;  Service: Endoscopy;  Laterality: N/A;  . EYE SURGERY  11/01/2012   BOTH EYES CATARACTS  . INSERT / REPLACE / REMOVE PACEMAKER  10/06/10   MDT  implanted by Dr [name]  . KNEE ARTHROSCOPY     both  . LEFT HEART CATH AND CORONARY ANGIOGRAPHY N/A 03/24/2018   Procedure: LEFT HEART CATH AND CORONARY ANGIOGRAPHY;  Surgeon: Lorretta Harp, MD;  Location: [facility] CV LAB;  Service: Cardiovascular;  Laterality: N/A;  . OVARY SURGERY     removal  . REPAIR RECTOCELE    . SIMPLE MASTECTOMY WITH AXILLARY SENTINEL NODE BIOPSY Left 01/09/2015   Procedure: Irrigation and Drainage Abcess left Axilla;  Surgeon: Jackolyn Confer, MD;  Location: [facility] ORS;  Service: General;  Laterality: Left;  . TONSILLECTOMY  AGE 51  . TOTAL ABDOMINAL HYSTERECTOMY  1971  There were no vitals filed for this visit.    Subjective Assessment - 02/26/21 1614    Subjective COVID-19 screen performed prior to patient entering clinic.  The patient presents to the clinic with a CC of right sided neck pain.  She also has a h/o low back pain but would like to wait on that at this time and focus treatments on her neck.  She reports that in December of 2021 she was using a food dicer with her right hand and then following this activity she began to experience right sided neck pain with radiation into her right Tricep region.  Her pain is rated at a 4/10 today and higher  with increased use of her right UE.  Keeping her right shoulder still decreases her pain.  She has tried OTC pain patches but quit using them as they irritated her skin and caused a scab in the back of her shoulder.  She is using a Rollator for safe ambulation and is on 2L/min of portable 02.    Pertinent History Bilateral knee ATS, h/o LBP, PACEMAKER, latex and bee sting allergies.    Patient Stated Goals Use right UE without much pain.    Currently in Pain? Yes    Pain Score 4     Pain Location Shoulder    Pain Orientation Right    Pain Descriptors / Indicators Aching;Sore;Throbbing    Pain Type Chronic pain;Acute pain    Pain Onset More than a month ago    Pain Frequency Constant    Aggravating Factors  See above.    Pain Relieving Factors See above.              [facility] PT Assessment - 02/26/21 0001      Assessment   Medical Diagnosis Cervical Spondylosis.    Referring Provider (PT) Dorene Ar MD    Onset Date/Surgical Date --   12/21.     Precautions   Precautions Fall    Precaution Comments PACEMAKER.  Rollator usage and portable 02 at 2L/min.      Restrictions   Weight Bearing Restrictions No      Balance Screen   Has the patient fallen in the past 6 months Yes    How many times? 2-3.    Has the patient had a decrease in activity level because of a fear of falling?  No    Is the patient reluctant to leave their home because of a fear of falling?  No      Home Environment   Living Environment Private residence      Prior Function   Level of Independence Independent      Observation/Other Assessments   Skin Integrity Small healed  scab from pain patch in righ posterior shoulder region.      Posture/Postural Control   Posture/Postural Control Postural limitations    Postural Limitations Rounded Shoulders;Forward head      Deep Tendon Reflexes   DTR Assessment Site Biceps;Brachioradialis;Triceps    Biceps DTR 1+    Brachioradialis DTR 1+    Triceps DTR 1+       ROM / Strength   AROM / PROM / Strength AROM;Strength      AROM   Overall AROM Comments Normal right UE AROM.  Right active cervical rotation= 50 degrees and left= 40 degrees.      Strength   Overall Strength Comments Normal UE strength.      Palpation   Palpation comment Tender to palpation  over patient's right mid-cervical paraspinal musculature and UT which is remarkable for a trigger point.      Ambulation/Gait   Gait Comments The patient is ambulating with a Rollator for safety and portable 02 at 2L/min.                      Objective measurements completed on examination: See above findings.       [facility] Adult PT Treatment/Exercise - 02/26/21 0001      Modalities   Modalities Moist Heat      Moist Heat Therapy   Number Minutes Moist Heat 10 Minutes    Moist Heat Location --   RT cervical.                 PT Education - 02/26/21 1635    Education Details Chin tucks and pain-free cervical extension.               PT Long Term Goals - 02/26/21 1637      PT LONG TERM GOAL #1   Title Independent with a HEP.    Time 6    Period Weeks    Status New      PT LONG TERM GOAL #2   Title Increase active cervical rotation to 60 degrees+ so patient can turn head more easily while driving.    Time 6    Period Weeks    Status New      PT LONG TERM GOAL #3   Title Eliminate right UE symptoms.    Time 6    Period Weeks    Status New      PT LONG TERM GOAL #4   Title Perform ADL's with neck pain not > 2-3/10.    Time 6    Period Weeks    Status New                  Plan - 02/26/21 1610    Clinical Impression Statement The patient presents to [facility] with a CC of right sided neck pain and radiation of pain into her right Tricep.  This has been ongoing since December of 2021 after using a food dicer.  Her pain is over the mid-cervical region on the right and over the right UT which is remarkable for a trigger.  Her UE range of  motion and strength is normal.  Her active cervical rotation is restricted.  Patient will benefit from skilled physical therapy intervention to address pain and deficits.    Personal Factors and Comorbidities Comorbidity 1;Other;Comorbidity 2    Comorbidities Bilateral knee ATS, h/o LBP, PACEMAKER, latex and bee sting allergies.    Examination-Activity Limitations Other    Examination-Participation Restrictions Other    Stability/Clinical Decision Making Evolving/Moderate complexity    Clinical Decision Making Low    Rehab Potential Good    PT Frequency 2x / week    PT Duration 6 weeks    PT Treatment/Interventions ADLs/Self Care Home Management;Traction;Moist Heat;Therapeutic activities;Therapeutic exercise;Manual techniques;Patient/family education;Passive range of motion;Dry needling    PT Next Visit Plan FOTO....Marland KitchenSTW/M to patient's right cervical muscualture.  Manual traction and is she has a good response can try intermittment cervical traction beginning at 12# x 10 minutes.  Postural exercises.    Consulted and Agree with Plan of Care Patient           Patient will benefit from skilled therapeutic intervention in order to improve the following deficits and impairments:  Pain,Increased muscle  spasms,Decreased activity tolerance,Decreased range of motion  Visit Diagnosis: Cervicalgia - Plan: PT plan of care cert/re-cert  Abnormal posture - Plan: PT plan of care cert/re-cert     Problem List Patient Active Problem List   Diagnosis Date Noted  . Acute on chronic diastolic (congestive) heart failure ([place]) 05/06/2020  . Acute on chronic diastolic congestive heart failure ([place]) 05/05/2020  . Hypomagnesemia 05/05/2020  . CKD (chronic kidney disease), stage III ([place]) 05/05/2020  . Persistent atrial fibrillation ([place]) 02/20/2020  . Secondary hypercoagulable state ([place]) 02/20/2020  . Acute and chronic respiratory failure (acute-on-chronic) ([place]) 08/30/2019  . COPD exacerbation ([place])  08/29/2019  . Chest pain 07/18/2018  . Sepsis due to urinary tract infection ([place]) 06/30/2018  . AKI (acute kidney injury) ([place]) 06/30/2018  . Sepsis, unspecified organism ([place]) 06/30/2018  . Normocytic anemia 06/30/2018  . Coronary artery disease due to lipid rich plaque   . Unstable angina ([place]) 03/23/2018  . Acute respiratory distress 09/26/2017  . Acute exacerbation of chronic obstructive pulmonary disease (COPD) ([place]) 09/26/2017  . 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 Type 2 diabetes mellitus ([place]) 09/26/2017  . Acute on chronic diastolic CHF (congestive heart failure) ([place]) 09/26/2017  . Cardiac pacemaker 11/17/2016  . Eczema 11/17/2016  . Major depression, recurrent, chronic ([place]) 09/17/2016  . Axillary abscess 07/22/2016  . Chronic left shoulder pain 10/01/2015  . Xerosis cutis 10/01/2015  . Paroxysmal atrial fibrillation ([place]) 05/24/2014  . PAF (paroxysmal atrial fibrillation) ([place]) 05/24/2014  . NAFLD (nonalcoholic fatty liver disease) 03/29/2014  . Renal cyst 03/29/2014  . Elevated TSH 03/06/2014  . Type 2 diabetes mellitus without complication, without long-term current use of insulin ([place]) 03/06/2014  . Peripheral neuropathy 04/27/2013  . Vitamin B12 deficiency 04/27/2013  . Abnormality of gait 03/29/2013  . Diabetic neuropathy ([place]) 03/29/2013  . OSA (obstructive sleep apnea) 08/26/2012  . Chronic asthmatic bronchitis ([place]) 08/26/2012  . Preop cardiovascular exam 08/15/2012  . Nocturnal hypoxia 04/08/2012  . Seborrheic dermatitis 10/21/2011  . IBS (irritable bowel syndrome) 05/27/2011  . Esophageal reflux 03/27/2011  . Mixed hyperlipidemia 03/27/2011  . Chronic obstructive pulmonary disease ([place]) 03/27/2011  . 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 Essential hypertension 02/09/2011  . Obesity 02/09/2011  . Tobacco use disorder 02/05/2011  . Vitamin D deficiency 12/30/2010  . Osteoarthrosis  involving lower leg 12/26/2010  . Allergic rhinitis 01/23/2009  . Mixed incontinence 10/08/2008    Gaylon Bentz, [place] MPT 02/26/2021, 4:40 PM  [facility] [address] [place], [place], [address] Phone: [phone]   Fax:  [phone]  Name: Brandi Hunter MRN: [id number] Date of Birth: 06/14/1941

## 2021-03-03 ENCOUNTER — Ambulatory Visit: Payer: Medicare HMO | Admitting: Physical Therapy

## 2021-03-04 ENCOUNTER — Ambulatory Visit: Payer: Medicare HMO | Admitting: Physical Therapy

## 2021-03-04 ENCOUNTER — Telehealth: Payer: Self-pay | Admitting: Cardiology

## 2021-03-04 ENCOUNTER — Other Ambulatory Visit: Payer: Self-pay

## 2021-03-04 ENCOUNTER — Encounter: Payer: Self-pay | Admitting: Physical Therapy

## 2021-03-04 DIAGNOSIS — R293 Abnormal posture: Secondary | ICD-10-CM

## 2021-03-04 DIAGNOSIS — M542 Cervicalgia: Secondary | ICD-10-CM | POA: Diagnosis not present

## 2021-03-04 NOTE — Telephone Encounter (Signed)
Thank you Megan.  I would suggest holding the Eliquis for 3 days without Lovenox bridge.

## 2021-03-04 NOTE — Telephone Encounter (Signed)
Clinical pharmacist to review Eliquis 

## 2021-03-04 NOTE — Telephone Encounter (Signed)
Would follow office protocol.

## 2021-03-04 NOTE — Telephone Encounter (Signed)
    [facility] Pre-operative Risk Assessment    HEARTCARE STAFF: - Please ensure there is not already an duplicate clearance open for this procedure. - Under Visit Info/Reason for Call, type in Other and utilize the format Clearance MM/DD/YY or Clearance TBD. Do not use dashes or single digits. - If request is for dental extraction, please clarify the # of teeth to be extracted.  Request for surgical clearance:  1. What type of surgery is being performed? Cervical Epidural Steroid Injection  2. When is this surgery scheduled? TBD  3. What type of clearance is required (medical clearance vs. Pharmacy clearance to hold med vs. Both)? Would like to discontinue blood thinner Eliquis for 3 days in order to perform the procedure  4. Are there any medications that need to be held prior to surgery and how long? Eliquis  5. Practice name and name of physician performing surgery? [facility]  6. What is the office phone number? [phone]   7.   What is the office fax number? [phone]  8.   Anesthesia type (None, local, MAC, general) ? None (NA)   Amy B Barnes 03/04/2021, 12:19 PM  _________________________________________________________________   (provider comments below)

## 2021-03-04 NOTE — Therapy (Signed)
[facility] [place], [place], [address] Phone: [phone]   Fax:  [phone]  Physical Therapy Treatment  Patient Details  Name: Brandi Hunter MRN: [id number] Date of Birth: [birth year] Referring Provider (PT): Dorene Ar MD   Encounter Date: 03/04/2021   PT End of Session - 03/04/21 0939    Visit Number 2    Number of Visits 12    Date for PT Re-Evaluation 04/09/21    Authorization Type FOTO AT LEAST EVERY 5TH VISIT.  PROGRESS NOTE AT 10TH VISIT.  KX MODIFIER AFTER 15 VISITS.    PT Start Time 0903    PT Stop Time 0946    PT Time Calculation (min) 43 min    Activity Tolerance Patient tolerated treatment well    Behavior During Therapy [facility] for tasks assessed/performed           Past Medical History:  Diagnosis Date  . Asthma   . CAD (coronary artery disease)    a. catheterization in 03/2018 showing 90% ostial OM stenosis with medical management recommended at that time  . Chronic anticoagulation   . COPD (chronic obstructive pulmonary disease) ([place])   . DJD (degenerative joint disease)   . Essential hypertension   . GERD (gastroesophageal reflux disease)   . History of home oxygen therapy   . Hyperlipidemia   . Mobitz (type) II atrioventricular block   . Obesity   . Pacemaker    Implanted by Dr [name] (MDT) 10/06/10  . PAF (paroxysmal atrial fibrillation) ([place])   . Pancreatitis 2010 OR 2011  . Sleep apnea    CPAP  . Type 2 diabetes mellitus ([place])     Past Surgical History:  Procedure Laterality Date  . CARDIOVERSION N/A 08/18/2019   Procedure: CARDIOVERSION;  Surgeon: Lelon Perla, MD;  Location: [facility] ENDOSCOPY;  Service: Cardiovascular;  Laterality: N/A;  . CATARACT EXTRACTION W/PHACO  11/02/2011   Procedure: CATARACT EXTRACTION PHACO AND INTRAOCULAR LENS PLACEMENT ([place]);  Surgeon: Williams Che;  Location: [facility] ORS;  Service: Ophthalmology;  Laterality: Right;  CDE=7.33  . CATARACT EXTRACTION  W/PHACO  12/07/2011   Procedure: CATARACT EXTRACTION PHACO AND INTRAOCULAR LENS PLACEMENT (IOC);  Surgeon: Williams Che, MD;  Location: [facility] ORS;  Service: Ophthalmology;  Laterality: Left;  CDE 3.61  . COLONOSCOPY WITH PROPOFOL N/A 08/09/2014   Procedure: COLONOSCOPY WITH PROPOFOL;  Surgeon: Juanita Craver, MD;  [facility] ENDOSCOPY;  Service: Endoscopy;  Laterality: N/A;  . CYSTOCELE REPAIR    . ESOPHAGOGASTRODUODENOSCOPY (EGD) WITH PROPOFOL N/A 08/09/2014   Procedure: ESOPHAGOGASTRODUODENOSCOPY (EGD) WITH PROPOFOL;  Surgeon: Juanita Craver, MD;  [facility] ENDOSCOPY;  Service: Endoscopy;  Laterality: N/A;  . EYE SURGERY  11/01/2012   BOTH EYES CATARACTS  . INSERT / REPLACE / REMOVE PACEMAKER  10/06/10   MDT  implanted by Dr [name]  . KNEE ARTHROSCOPY     both  . LEFT HEART CATH AND CORONARY ANGIOGRAPHY N/A 03/24/2018   Procedure: LEFT HEART CATH AND CORONARY ANGIOGRAPHY;  Surgeon: Lorretta Harp, MD;  Location: [facility] CV LAB;  Service: Cardiovascular;  Laterality: N/A;  . OVARY SURGERY     removal  . REPAIR RECTOCELE    . SIMPLE MASTECTOMY WITH AXILLARY SENTINEL NODE BIOPSY Left 01/09/2015   Procedure: Irrigation and Drainage Abcess left Axilla;  Surgeon: Jackolyn Confer, MD;  [facility] ORS;  Service: General;  Laterality: Left;  . TONSILLECTOMY  AGE 34  . TOTAL ABDOMINAL HYSTERECTOMY  1971  There were no vitals filed for this visit.   Subjective Assessment - 03/04/21 0939    Subjective COVID-19 screen performed prior to patient entering clinic.  Neck feels pretty good today.    Pertinent History Bilateral knee ATS, h/o LBP, PACEMAKER, latex and bee sting allergies.    Currently in Pain? Yes    Pain Score 2     Pain Location Neck    Pain Orientation Right    Pain Descriptors / Indicators Aching;Sore;Throbbing    Pain Type Chronic pain    Pain Onset More than a month ago                             [facility] Adult PT Treatment/Exercise - 03/04/21 0001       Modalities   Modalities Moist Heat      Moist Heat Therapy   Number Minutes Moist Heat 15 Minutes    Moist Heat Location --   RT cervical.     Manual Therapy   Manual Therapy Soft tissue mobilization    Soft tissue mobilization STW/M x 23 minutes to patient's affected right cervical musculature/UT to reduce tone.                       PT Long Term Goals - 02/26/21 1637      PT LONG TERM GOAL #1   Title Independent with a HEP.    Time 6    Period Weeks    Status New      PT LONG TERM GOAL #2   Title Increase active cervical rotation to 60 degrees+ so patient can turn head more easily while driving.    Time 6    Period Weeks    Status New      PT LONG TERM GOAL #3   Title Eliminate right UE symptoms.    Time 6    Period Weeks    Status New      PT LONG TERM GOAL #4   Title Perform ADL's with neck pain not > 2-3/10.    Time 6    Period Weeks    Status New                 Plan - 03/04/21 9983    Clinical Impression Statement Patient having a pretty good day regarding neck pain.  She did great with STW/M.    Personal Factors and Comorbidities Comorbidity 1;Other;Comorbidity 2    Comorbidities Bilateral knee ATS, h/o LBP, PACEMAKER, latex and bee sting allergies.    Examination-Activity Limitations Other    Examination-Participation Restrictions Other    Stability/Clinical Decision Making Evolving/Moderate complexity    Rehab Potential Good    PT Frequency 2x / week    PT Duration 6 weeks    PT Treatment/Interventions ADLs/Self Care Home Management;Traction;Moist Heat;Therapeutic activities;Therapeutic exercise;Manual techniques;Patient/family education;Passive range of motion;Dry needling    PT Next Visit Plan FOTO....Marland KitchenSTW/M to patient's right cervical muscualture.  Manual traction and is she has a good response can try intermittment cervical traction beginning at 12# x 10 minutes.  Postural exercises.    Consulted and Agree with Plan of Care  Patient           Patient will benefit from skilled therapeutic intervention in order to improve the following deficits and impairments:  Pain,Increased muscle spasms,Decreased activity tolerance,Decreased range of motion  Visit Diagnosis: Cervicalgia  Abnormal posture     Problem  List Patient Active Problem List   Diagnosis Date Noted  . Acute on chronic diastolic (congestive) heart failure ([place]) 05/06/2020  . Acute on chronic diastolic congestive heart failure ([place]) 05/05/2020  . Hypomagnesemia 05/05/2020  . CKD (chronic kidney disease), stage III ([place]) 05/05/2020  . Persistent atrial fibrillation ([place]) 02/20/2020  . Secondary hypercoagulable state ([place]) 02/20/2020  . Acute and chronic respiratory failure (acute-on-chronic) ([place]) 08/30/2019  . COPD exacerbation ([place]) 08/29/2019  . Chest pain 07/18/2018  . Sepsis due to urinary tract infection ([place]) 06/30/2018  . AKI (acute kidney injury) ([place]) 06/30/2018  . Sepsis, unspecified organism ([place]) 06/30/2018  . Normocytic anemia 06/30/2018  . Coronary artery disease due to lipid rich plaque   . Unstable angina ([place]) 03/23/2018  . Acute respiratory distress 09/26/2017  . Acute exacerbation of chronic obstructive pulmonary disease (COPD) ([place]) 09/26/2017  . 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 Type 2 diabetes mellitus ([place]) 09/26/2017  . Acute on chronic diastolic CHF (congestive heart failure) ([place]) 09/26/2017  . Cardiac pacemaker 11/17/2016  . Eczema 11/17/2016  . Major depression, recurrent, chronic ([place]) 09/17/2016  . Axillary abscess 07/22/2016  . Chronic left shoulder pain 10/01/2015  . Xerosis cutis 10/01/2015  . Paroxysmal atrial fibrillation ([place]) 05/24/2014  . PAF (paroxysmal atrial fibrillation) ([place]) 05/24/2014  . NAFLD (nonalcoholic fatty liver disease) 03/29/2014  . Renal cyst 03/29/2014  . Elevated TSH 03/06/2014  . Type 2 diabetes mellitus without complication, without long-term current use  of insulin ([place]) 03/06/2014  . Peripheral neuropathy 04/27/2013  . Vitamin B12 deficiency 04/27/2013  . Abnormality of gait 03/29/2013  . Diabetic neuropathy ([place]) 03/29/2013  . OSA (obstructive sleep apnea) 08/26/2012  . Chronic asthmatic bronchitis ([place]) 08/26/2012  . Preop cardiovascular exam 08/15/2012  . Nocturnal hypoxia 04/08/2012  . Seborrheic dermatitis 10/21/2011  . IBS (irritable bowel syndrome) 05/27/2011  . Esophageal reflux 03/27/2011  . Mixed hyperlipidemia 03/27/2011  . Chronic obstructive pulmonary disease ([place]) 03/27/2011  . 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 Essential hypertension 02/09/2011  . Obesity 02/09/2011  . Tobacco use disorder 02/05/2011  . Vitamin D deficiency 12/30/2010  . Osteoarthrosis involving lower leg 12/26/2010  . Allergic rhinitis 01/23/2009  . Mixed incontinence 10/08/2008    Brandi Hunter, Brandi Hunter 03/04/2021, 9:46 AM  [facility] [address] [place], [place], [address] Phone: [phone]   Fax:  [phone]  Name: Brandi Hunter MRN: [id number] Date of Birth: [birth year]

## 2021-03-04 NOTE — Telephone Encounter (Signed)
Patient with diagnosis of atrial fibrillation on Eliquis for anticoagulation.    Procedure: Cervical Epidural Steroid Injection  Date of procedure: TBD  CHA2DS2-VASc Score = 7  This indicates a 11.2% annual risk of stroke. The patient's score is based upon: CHF History: Yes HTN History: Yes Diabetes History: Yes Stroke History: No Vascular Disease History: Yes Age Score: 2 Gender Score: 1   CrCl 38 ml/min Platelet count 313K  Per office protocol, Eliquis typically held for held for 3 days, however given patient's elevated cardiovascular risk will defer to cardiologist.

## 2021-03-04 NOTE — Telephone Encounter (Signed)
She doesn't really fit in our office protocol - for patients on warfarin with a CHADS2VASc score of at least 7 we'd recommend bridging with Lovenox, but DOAC patients don't really fall into the same risk category since the anticoagulation interruption is much shorter. Some physicians prefer [place] to bridge patients with Lovenox still (she'd get ~2 days worth of injections pre-procedure and then just resume Eliquis afterwards), but just as many advise the pt to hold their DOAC for 3 days without bridging and resume as soon as safely possible afterwards.

## 2021-03-04 NOTE — Patient Instructions (Signed)
[facility]  DRY NEEDLING CONSENT FORM   Trigger point dry needling is a physical therapy approach to treat Myofascial Pain and Dysfunction.  Dry Needling (DN) is a valuable and effective way to deactivate myofascial trigger points (muscle knots/pain). It is skilled intervention that uses a thin filiform needle to penetrate the skin and stimulate underlying myofascial trigger points, muscular, and connective tissues for the management of neuromusculoskeletal pain and movement impairments.  A local twitch response (LTR) will be elicited.  This can sometimes feel like a deep ache in the muscle during the procedure. Multiple trigger points in multiple muscles can be treated during each treatment.  No medication of any kind is injected.   As with any medical treatment and procedure, there are possible adverse events.  While significant adverse events are uncommon, they do sometimes occur and must be considered prior to giving consent.  1. Dry needling often causes a "post needling soreness".  There can be an increase in pain from a couple of hours to 2-3 days, followed by an improvement in the overall pain state. 2. Any time a needle is used there is a risk of infection.  However, we are using new, sterile, and disposable needles; infections are extremely rare. 3. There is a possibility that you may bleed or bruise.  You may feel tired and some nausea following treatment. 4. There is a rare possibility of a pneumothorax (air in the chest cavity). 5. Allergic reaction to nickel in the stainless steel needle. 6. If a nerve is touched, it may cause paresthesia (a prickling/shock sensation) which is usually brief, but may continue for a couple of days.  Following treatment stay hydrated.  Continue regular activities but not too vigorous initially after treatment for 24-48 hours.  Dry Needling is best when combined with other physical therapy interventions such as  strengthening, stretching and other therapeutic modalities.   PLEASE ANSWER THE FOLLOWING QUESTIONS:  Do you have a lack of sensation?   Y/N  Do you have a phobia or fear of needles  Y/N  Are you pregnant?    Y/N If yes:  How many weeks? __________ Do you have any implanted devices?  Y/N If yes:  Pacemaker/Spinal Cord Stimulator/Deep Brain Stimulator/Insulin Pump/Other: ________________ Do you have any implants?  Y/N If yes: Breast/Facial/Pecs/Buttocks/Calves/Hip  Replacement/ Knee Replacement/Other: _________ Do you take any blood thinners?   Y/N If yes: Coumadin (Warfarin)/Other: ___________________ Do you have a bleeding disorder?   Y/N If yes: What kind: _________________________________ Do you take any immunosuppressants?  Y/N If yes:   What kind: _________________________________ Do you take anti-inflammatories?   Y/N If yes: What kind: Advil/Aspirin/Other: ________________ Have you ever been diagnosed with Scoliosis? Y/N Have you had back surgery?   Y/N If yes:  Laminectomy/Fusion/Other: ___________________   I have read, or had read to me, the above.  I have had the opportunity to ask any questions.  All of my questions have been answered to my satisfaction and I understand the risks involved with dry needling.  I consent to examination and treatment at [facility], including dry needling, of any and all of my involved and affected muscles.     Signature: __________________________________     Date:___________________________________________________

## 2021-03-05 ENCOUNTER — Telehealth: Payer: Self-pay

## 2021-03-05 ENCOUNTER — Ambulatory Visit: Payer: Medicare HMO | Admitting: Physical Therapy

## 2021-03-05 ENCOUNTER — Other Ambulatory Visit: Payer: Self-pay

## 2021-03-05 DIAGNOSIS — M542 Cervicalgia: Secondary | ICD-10-CM | POA: Diagnosis not present

## 2021-03-05 DIAGNOSIS — R293 Abnormal posture: Secondary | ICD-10-CM

## 2021-03-05 NOTE — Telephone Encounter (Signed)
   Name: Brandi Hunter  DOB: 04/12/1941  MRN: [id number]   Primary Cardiologist: Rozann Lesches, MD  Chart reviewed as part of pre-operative protocol coverage. Patient was contacted 03/05/2021 in reference to pre-operative risk assessment for pending surgery as outlined below.  Brandi Hunter was last seen on 12/25/2020 by Dr. Domenic Polite.  Since that day, Brandi Hunter has done well without chest pain or worsening dyspnea.  Therefore, based on ACC/AHA guidelines, the patient would be at acceptable risk for the planned procedure without further cardiovascular testing.   Patient has been instructed to hold Eliquis for 3 days prior to the procedure and restart as soon as possible at the surgeon's discretion.  The patient was advised that if she develops new symptoms prior to surgery to contact our office to arrange for a follow-up visit, and she verbalized understanding.  I will route this recommendation to the requesting party via Epic fax function and remove from pre-op pool. Please call with questions.  [place], [place] 03/05/2021, 3:08 PM

## 2021-03-05 NOTE — Therapy (Signed)
[facility] [place], [place], [address] Phone: [phone]   Fax:  [phone]  Physical Therapy Treatment  Patient Details  Name: Brandi Hunter MRN: [id number] Date of Birth: [birth year] Referring Provider (PT): Dorene Ar MD   Encounter Date: 03/05/2021   PT End of Session - 03/05/21 1550    Visit Number 3    Number of Visits 12    Date for PT Re-Evaluation 04/09/21    Authorization Type FOTO AT LEAST EVERY 5TH VISIT.  PROGRESS NOTE AT 10TH VISIT.  KX MODIFIER AFTER 15 VISITS.    PT Start Time 0315    PT Stop Time 0359    PT Time Calculation (min) 44 min    Activity Tolerance Patient tolerated treatment well    Behavior During Therapy [facility] for tasks assessed/performed           Past Medical History:  Diagnosis Date  . Asthma   . CAD (coronary artery disease)    a. catheterization in 03/2018 showing 90% ostial OM stenosis with medical management recommended at that time  . Chronic anticoagulation   . COPD (chronic obstructive pulmonary disease) ([place])   . DJD (degenerative joint disease)   . Essential hypertension   . GERD (gastroesophageal reflux disease)   . History of home oxygen therapy   . Hyperlipidemia   . Mobitz (type) II atrioventricular block   . Obesity   . Pacemaker    Implanted by Dr [name] (MDT) 10/06/10  . PAF (paroxysmal atrial fibrillation) ([place])   . Pancreatitis 2010 OR 2011  . Sleep apnea    CPAP  . Type 2 diabetes mellitus ([place])     Past Surgical History:  Procedure Laterality Date  . CARDIOVERSION N/A 08/18/2019   Procedure: CARDIOVERSION;  Surgeon: Lelon Perla, MD;  Location: [facility] ENDOSCOPY;  Service: Cardiovascular;  Laterality: N/A;  . CATARACT EXTRACTION W/PHACO  11/02/2011   Procedure: CATARACT EXTRACTION PHACO AND INTRAOCULAR LENS PLACEMENT ([place]);  Surgeon: Williams Che;  Location: [facility] ORS;  Service: Ophthalmology;  Laterality: Right;  CDE=7.33  . CATARACT EXTRACTION  W/PHACO  12/07/2011   Procedure: CATARACT EXTRACTION PHACO AND INTRAOCULAR LENS PLACEMENT (IOC);  Surgeon: Williams Che, MD;  Location: [facility] ORS;  Service: Ophthalmology;  Laterality: Left;  CDE 3.61  . COLONOSCOPY WITH PROPOFOL N/A 08/09/2014   Procedure: COLONOSCOPY WITH PROPOFOL;  Surgeon: Juanita Craver, MD;  Location: [facility] ENDOSCOPY;  Service: Endoscopy;  Laterality: N/A;  . CYSTOCELE REPAIR    . ESOPHAGOGASTRODUODENOSCOPY (EGD) WITH PROPOFOL N/A 08/09/2014   Procedure: ESOPHAGOGASTRODUODENOSCOPY (EGD) WITH PROPOFOL;  Surgeon: Juanita Craver, MD;  Location: [facility] ENDOSCOPY;  Service: Endoscopy;  Laterality: N/A;  . EYE SURGERY  11/01/2012   BOTH EYES CATARACTS  . INSERT / REPLACE / REMOVE PACEMAKER  10/06/10   MDT  implanted by Dr [name]  . KNEE ARTHROSCOPY     both  . LEFT HEART CATH AND CORONARY ANGIOGRAPHY N/A 03/24/2018   Procedure: LEFT HEART CATH AND CORONARY ANGIOGRAPHY;  Surgeon: Lorretta Harp, MD;  Location: [facility] CV LAB;  Service: Cardiovascular;  Laterality: N/A;  . OVARY SURGERY     removal  . REPAIR RECTOCELE    . SIMPLE MASTECTOMY WITH AXILLARY SENTINEL NODE BIOPSY Left 01/09/2015   Procedure: Irrigation and Drainage Abcess left Axilla;  Surgeon: Jackolyn Confer, MD;  Location: [facility] ORS;  Service: General;  Laterality: Left;  . TONSILLECTOMY  AGE 80  . TOTAL ABDOMINAL HYSTERECTOMY  1971  There were no vitals filed for this visit.   Subjective Assessment - 03/05/21 1551    Subjective COVID-19 screen performed prior to patient entering clinic.  Neck is better.  Patient would like to start treatment to low back which is scheduled for next week.    Pertinent History Bilateral knee ATS, h/o LBP, PACEMAKER, latex and bee sting allergies.    Patient Stated Goals Use right UE without much pain.    Currently in Pain? Yes    Pain Score 2     Pain Location Neck    Pain Orientation Right    Pain Descriptors / Indicators Aching;Dull    Pain Type Chronic pain    Pain Onset More  than a month ago                             [facility] Adult PT Treatment/Exercise - 03/05/21 0001      Modalities   Modalities Moist Heat      Moist Heat Therapy   Number Minutes Moist Heat 15 Minutes    Moist Heat Location Cervical      Manual Therapy   Manual Therapy Soft tissue mobilization    Soft tissue mobilization STW/M x 23 minutes to patient's right cervical musculature both in the seated position and in supine in which gentle manual tarction was performed.                       PT Long Term Goals - 02/26/21 1637      PT LONG TERM GOAL #1   Title Independent with a HEP.    Time 6    Period Weeks    Status New      PT LONG TERM GOAL #2   Title Increase active cervical rotation to 60 degrees+ so patient can turn head more easily while driving.    Time 6    Period Weeks    Status New      PT LONG TERM GOAL #3   Title Eliminate right UE symptoms.    Time 6    Period Weeks    Status New      PT LONG TERM GOAL #4   Title Perform ADL's with neck pain not > 2-3/10.    Time 6    Period Weeks    Status New                 Plan - 03/05/21 1553    Clinical Impression Statement The patient reporting her neck is doing much better.  She enjoyed manual traction and felt better following.    Personal Factors and Comorbidities Comorbidity 1;Other;Comorbidity 2    Comorbidities Bilateral knee ATS, h/o LBP, PACEMAKER, latex and bee sting allergies.    Examination-Activity Limitations Other    Examination-Participation Restrictions Other    Stability/Clinical Decision Making Evolving/Moderate complexity    Rehab Potential Good    PT Frequency 2x / week    PT Duration 6 weeks    PT Treatment/Interventions ADLs/Self Care Home Management;Traction;Moist Heat;Therapeutic activities;Therapeutic exercise;Manual techniques;Patient/family education;Passive range of motion;Dry needling    PT Next Visit Plan FOTO....Marland KitchenSTW/M to patient's right  cervical muscualture.  Manual traction and is she has a good response can try intermittment cervical traction beginning at 12# x 10 minutes.  Postural exercises.    Consulted and Agree with Plan of Care Patient  Patient will benefit from skilled therapeutic intervention in order to improve the following deficits and impairments:  Pain,Increased muscle spasms,Decreased activity tolerance,Decreased range of motion  Visit Diagnosis: Cervicalgia  Abnormal posture     Problem List Patient Active Problem List   Diagnosis Date Noted  . Acute on chronic diastolic (congestive) heart failure ([place]) 05/06/2020  . Acute on chronic diastolic congestive heart failure ([place]) 05/05/2020  . Hypomagnesemia 05/05/2020  . CKD (chronic kidney disease), stage III ([place]) 05/05/2020  . Persistent atrial fibrillation ([place]) 02/20/2020  . Secondary hypercoagulable state ([place]) 02/20/2020  . Acute and chronic respiratory failure (acute-on-chronic) ([place]) 08/30/2019  . COPD exacerbation ([place]) 08/29/2019  . Chest pain 07/18/2018  . Sepsis due to urinary tract infection ([place]) 06/30/2018  . AKI (acute kidney injury) ([place]) 06/30/2018  . Sepsis, unspecified organism ([place]) 06/30/2018  . Normocytic anemia 06/30/2018  . Coronary artery disease due to lipid rich plaque   . Unstable angina ([place]) 03/23/2018  . Acute respiratory distress 09/26/2017  . Acute exacerbation of chronic obstructive pulmonary disease (COPD) ([place]) 09/26/2017  . 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 Type 2 diabetes mellitus ([place]) 09/26/2017  . Acute on chronic diastolic CHF (congestive heart failure) ([place]) 09/26/2017  . Cardiac pacemaker 11/17/2016  . Eczema 11/17/2016  . Major depression, recurrent, chronic ([place]) 09/17/2016  . Axillary abscess 07/22/2016  . Chronic left shoulder pain 10/01/2015  . Xerosis cutis 10/01/2015  . Paroxysmal atrial fibrillation ([place]) 05/24/2014  . PAF (paroxysmal atrial fibrillation)  ([place]) 05/24/2014  . NAFLD (nonalcoholic fatty liver disease) 03/29/2014  . Renal cyst 03/29/2014  . Elevated TSH 03/06/2014  . Type 2 diabetes mellitus without complication, without long-term current use of insulin ([place]) 03/06/2014  . Peripheral neuropathy 04/27/2013  . Vitamin B12 deficiency 04/27/2013  . Abnormality of gait 03/29/2013  . Diabetic neuropathy ([place]) 03/29/2013  . OSA (obstructive sleep apnea) 08/26/2012  . Chronic asthmatic bronchitis ([place]) 08/26/2012  . Preop cardiovascular exam 08/15/2012  . Nocturnal hypoxia 04/08/2012  . Seborrheic dermatitis 10/21/2011  . IBS (irritable bowel syndrome) 05/27/2011  . Esophageal reflux 03/27/2011  . Mixed hyperlipidemia 03/27/2011  . Chronic obstructive pulmonary disease ([place]) 03/27/2011  . 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 Essential hypertension 02/09/2011  . Obesity 02/09/2011  . Tobacco use disorder 02/05/2011  . Vitamin D deficiency 12/30/2010  . Osteoarthrosis involving lower leg 12/26/2010  . Allergic rhinitis 01/23/2009  . Mixed incontinence 10/08/2008    Brookley Spitler, [place]  MPT 03/05/2021, 4:00 PM  [facility] [address] [place], [place], [address] Phone: [phone]   Fax:  [phone]  Name: Brandi Hunter MRN: [id number] Date of Birth: 10-Jun-1941

## 2021-03-05 NOTE — Telephone Encounter (Signed)
This is a duplicate request, I have forwarded the previous cardiac clearance letter back to the surgeon's office

## 2021-03-05 NOTE — Telephone Encounter (Signed)
   [facility] Pre-operative Risk Assessment    Patient Name: Brandi Hunter  DOB: Aug 09, 1941  MRN: [id number]   HEARTCARE STAFF: - Please ensure there is not already an duplicate clearance open for this procedure. - Under Visit Info/Reason for Call, type in Other and utilize the format Clearance MM/DD/YY or Clearance TBD. Do not use dashes or single digits. - If request is for dental extraction, please clarify the # of teeth to be extracted.  Request for surgical clearance:  1. What type of surgery is being performed? cERVICAL ePIDURAL sTERIOD iNJECTION  2. When is this surgery scheduled? TBD  3. What type of clearance is required (medical clearance vs. Pharmacy clearance to hold med vs. Both)? PHARMACY  4. Are there any medications that need to be held prior to surgery and how long? ELIQUIS 3 DAYS PRIOR  5. Practice name and name of physician performing surgery? [facility]   6. What is the office phone number? [phone]    7.   What is the office fax number? [phone]  8.   Anesthesia type (None, local, MAC, general) ? NOT SPECIFIED   Jannet Askew 03/05/2021, 9:58 AM  _________________________________________________________________   (provider comments below)

## 2021-03-05 NOTE — Telephone Encounter (Signed)
Clinical pharmacist to review Eliquis, taken for persistent atrial fibrillation

## 2021-03-07 ENCOUNTER — Ambulatory Visit (INDEPENDENT_AMBULATORY_CARE_PROVIDER_SITE_OTHER): Payer: Medicare HMO

## 2021-03-07 DIAGNOSIS — I441 Atrioventricular block, second degree: Secondary | ICD-10-CM | POA: Diagnosis not present

## 2021-03-10 LAB — CUP PACEART REMOTE DEVICE CHECK
Battery Impedance: 1869 Ohm
Battery Remaining Longevity: 29 mo
Battery Voltage: 2.74 V
Brady Statistic AP VP Percent: 27 %
Brady Statistic AP VS Percent: 0 %
Brady Statistic AS VP Percent: 11 %
Brady Statistic AS VS Percent: 62 %
Date Time Interrogation Session: 20220422172424
Implantable Lead Implant Date: 20111121
Implantable Lead Implant Date: 20111121
Implantable Lead Location: 753859
Implantable Lead Location: 753860
Implantable Lead Model: 4469
Implantable Lead Model: 4470
Implantable Lead Serial Number: 548226
Implantable Lead Serial Number: 687643
Implantable Pulse Generator Implant Date: 20111121
Lead Channel Impedance Value: 423 Ohm
Lead Channel Impedance Value: 473 Ohm
Lead Channel Pacing Threshold Amplitude: 0.625 V
Lead Channel Pacing Threshold Amplitude: 0.75 V
Lead Channel Pacing Threshold Pulse Width: 0.4 ms
Lead Channel Pacing Threshold Pulse Width: 0.4 ms
Lead Channel Setting Pacing Amplitude: 2.5 V
Lead Channel Setting Pacing Amplitude: 2.75 V
Lead Channel Setting Pacing Pulse Width: 0.4 ms
Lead Channel Setting Sensing Sensitivity: 5.6 mV

## 2021-03-11 ENCOUNTER — Other Ambulatory Visit: Payer: Self-pay

## 2021-03-11 ENCOUNTER — Ambulatory Visit: Payer: Medicare HMO | Admitting: Physical Therapy

## 2021-03-11 ENCOUNTER — Encounter: Payer: Self-pay | Admitting: Physical Therapy

## 2021-03-11 DIAGNOSIS — M542 Cervicalgia: Secondary | ICD-10-CM | POA: Diagnosis not present

## 2021-03-11 DIAGNOSIS — R293 Abnormal posture: Secondary | ICD-10-CM

## 2021-03-11 NOTE — Therapy (Signed)
[facility] [place], [place], [address] Phone: [phone]   Fax:  [phone]  Physical Therapy Treatment  Patient Details  Name: Brandi Hunter MRN: [id number] Date of Birth: [birth year] Referring Provider (PT): Dorene Ar MD   Encounter Date: 03/11/2021   PT End of Session - 03/11/21 1036    Visit Number 4    Number of Visits 12    Date for PT Re-Evaluation 04/09/21    Authorization Type FOTO AT LEAST EVERY 5TH VISIT.  PROGRESS NOTE AT 10TH VISIT.  KX MODIFIER AFTER 15 VISITS.    PT Start Time 1037    PT Stop Time 1120    PT Time Calculation (min) 43 min    Activity Tolerance Patient tolerated treatment well    Behavior During Therapy WFL for tasks assessed/performed           Past Medical History:  Diagnosis Date  . Asthma   . CAD (coronary artery disease)    a. catheterization in 03/2018 showing 90% ostial OM stenosis with medical management recommended at that time  . Chronic anticoagulation   . COPD (chronic obstructive pulmonary disease) ([place])   . DJD (degenerative joint disease)   . Essential hypertension   . GERD (gastroesophageal reflux disease)   . History of home oxygen therapy   . Hyperlipidemia   . Mobitz (type) II atrioventricular block   . Obesity   . Pacemaker    Implanted by Dr [name] (MDT) 10/06/10  . PAF (paroxysmal atrial fibrillation) ([place])   . Pancreatitis 2010 OR 2011  . Sleep apnea    CPAP  . Type 2 diabetes mellitus ([place])     Past Surgical History:  Procedure Laterality Date  . CARDIOVERSION N/A 08/18/2019   Procedure: CARDIOVERSION;  Surgeon: Lelon Perla, MD;  Location: [facility]COPY;  Service: Cardiovascular;  Laterality: N/A;  . CATARACT EXTRACTION W/PHACO  11/02/2011   Procedure: CATARACT EXTRACTION PHACO AND INTRAOCULAR LENS PLACEMENT ([place]);  Surgeon: Williams Che;  Location: [facility];  Service: Ophthalmology;  Laterality: Right;  CDE=7.33  . CATARACT EXTRACTION  W/PHACO  12/07/2011   Procedure: CATARACT EXTRACTION PHACO AND INTRAOCULAR LENS PLACEMENT (IOC);  Surgeon: Williams Che, MD;  Location: [facility];  Service: Ophthalmology;  Laterality: Left;  CDE 3.61  . COLONOSCOPY WITH PROPOFOL N/A 08/09/2014   Procedure: COLONOSCOPY WITH PROPOFOL;  Surgeon: Juanita Craver, MD;  Location: [facility];  Service: Endoscopy;  Laterality: N/A;  . CYSTOCELE REPAIR    . ESOPHAGOGASTRODUODENOSCOPY (EGD) WITH PROPOFOL N/A 08/09/2014   Procedure: ESOPHAGOGASTRODUODENOSCOPY (EGD) WITH PROPOFOL;  Surgeon: Juanita Craver, MD;  [facility];  Service: Endoscopy;  Laterality: N/A;  . EYE SURGERY  11/01/2012   BOTH EYES CATARACTS  . INSERT / REPLACE / REMOVE PACEMAKER  10/06/10   MDT  implanted by Dr [name]  . KNEE ARTHROSCOPY     both  . LEFT HEART CATH AND CORONARY ANGIOGRAPHY N/A 03/24/2018   Procedure: LEFT HEART CATH AND CORONARY ANGIOGRAPHY;  Surgeon: Lorretta Harp, MD;  Location: [facility];  Service: Cardiovascular;  Laterality: N/A;  . OVARY SURGERY     removal  . REPAIR RECTOCELE    . SIMPLE MASTECTOMY WITH AXILLARY SENTINEL NODE BIOPSY Left 01/09/2015   Procedure: Irrigation and Drainage Abcess left Axilla;  Surgeon: Jackolyn Confer, MD;  Location: [facility];  Service: General;  Laterality: Left;  . TONSILLECTOMY  AGE 80  . TOTAL ABDOMINAL HYSTERECTOMY  1971  There were no vitals filed for this visit.   Subjective Assessment - 03/11/21 1035    Subjective COVID-19 screen performed prior to patient entering clinic. Neck is better but has had some pain in low back. Reports that she has been tense.    Pertinent History Bilateral knee ATS, h/o LBP, PACEMAKER, latex and bee sting allergies.    Patient Stated Goals Use right UE without much pain.    Currently in Pain? Yes    Pain Score 4     Pain Location Neck    Pain Orientation Right;Left    Pain Descriptors / Indicators Discomfort    Pain Type Chronic pain    Pain Onset More than a month ago     Pain Frequency Constant              [facility] PT Assessment - 03/11/21 0001      Assessment   Medical Diagnosis Cervical Spondylosis.    Referring Provider (PT) Dorene Ar MD    Next MD Visit 03/24/2021      Precautions   Precautions Fall    Precaution Comments PACEMAKER.  Rollator usage and portable 02 at 2L/min.      Restrictions   Weight Bearing Restrictions No                         [facility] Adult PT Treatment/Exercise - 03/11/21 0001      Exercises   Exercises Neck;Shoulder      Neck Exercises: Seated   Cervical Isometrics Flexion;Extension;Right lateral flexion;Left lateral flexion;3 secs;10 reps    Neck Retraction 10 reps;5 secs;Limitations    Neck Retraction Limitations chin tuck; chin tuck with ext    Cervical Rotation Both;10 reps   more difficulty with R cervical rotation   X to V 15 reps    W Back 15 reps    Shoulder Rolls Backwards;15 reps      Shoulder Exercises: Seated   Retraction AROM;Both;15 reps      Manual Therapy   Manual Therapy Soft tissue mobilization;Manual Traction    Soft tissue mobilization STW to B UT, cervical paraspinals to reduce tightness and pain    Manual Traction Manual cervical traction in supine x5 min; light traction                  PT Education - 03/11/21 1132    Education Details DN consent form    Person(s) Educated Patient    Methods Explanation;Handout    Comprehension Verbalized understanding               PT Long Term Goals - 03/11/21 1149      PT LONG TERM GOAL #1   Title Independent with a HEP.    Time 6    Period Weeks    Status Achieved      PT LONG TERM GOAL #2   Title Increase active cervical rotation to 60 degrees+ so patient can turn head more easily while driving.    Time 6    Period Weeks    Status On-going      PT LONG TERM GOAL #3   Title Eliminate right UE symptoms.    Time 6    Period Weeks    Status Partially Met      PT LONG TERM GOAL #4   Title Perform  ADL's with neck pain not > 2-3/10.    Time 6    Period Weeks  Status On-going                 Plan - 03/11/21 1042    Clinical Impression Statement Patient presented in clinic with reports of some cervical discomfort. Patient to be off Eliquis for injection on 03/24/2021 and is willing to try DN. Patient progressed through cervical and postural strengthening with no complaints of pain. Patient does report greater limitation with R cervical rotation. Has experienced tension across B shoulders recently. Patient able to tolerate manual cervical traction fairly well although reporting some radicular symptoms to R shoulder. Patient encouraged to monitor R shoulder symptoms until next MD visit.    Personal Factors and Comorbidities Comorbidity 1;Other;Comorbidity 2    Comorbidities Bilateral knee ATS, h/o LBP, PACEMAKER, latex and bee sting allergies.    Examination-Activity Limitations Other    Examination-Participation Restrictions Other    Stability/Clinical Decision Making Evolving/Moderate complexity    Rehab Potential Good    PT Frequency 2x / week    PT Duration 6 weeks    PT Treatment/Interventions ADLs/Self Care Home Management;Traction;Moist Heat;Therapeutic activities;Therapeutic exercise;Manual techniques;Patient/family education;Passive range of motion;Dry needling    PT Next Visit Plan Monitor symptoms from manual traction.    Consulted and Agree with Plan of Care Patient           Patient will benefit from skilled therapeutic intervention in order to improve the following deficits and impairments:  Pain,Increased muscle spasms,Decreased activity tolerance,Decreased range of motion  Visit Diagnosis: Cervicalgia  Abnormal posture     Problem List Patient Active Problem List   Diagnosis Date Noted  . Acute on chronic diastolic (congestive) heart failure ([place]) 05/06/2020  . Acute on chronic diastolic congestive heart failure ([place]) 05/05/2020  . Hypomagnesemia  05/05/2020  . CKD (chronic kidney disease), stage III ([place]) 05/05/2020  . Persistent atrial fibrillation ([place]) 02/20/2020  . Secondary hypercoagulable state ([place]) 02/20/2020  . Acute and chronic respiratory failure (acute-on-chronic) ([place]) 08/30/2019  . COPD exacerbation ([place]) 08/29/2019  . Chest pain 07/18/2018  . Sepsis due to urinary tract infection ([place]) 06/30/2018  . AKI (acute kidney injury) ([place]) 06/30/2018  . Sepsis, unspecified organism ([place]) 06/30/2018  . Normocytic anemia 06/30/2018  . Coronary artery disease due to lipid rich plaque   . Unstable angina ([place]) 03/23/2018  . Acute respiratory distress 09/26/2017  . Acute exacerbation of chronic obstructive pulmonary disease (COPD) ([place]) 09/26/2017  . 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 Type 2 diabetes mellitus ([place]) 09/26/2017  . Acute on chronic diastolic CHF (congestive heart failure) ([place]) 09/26/2017  . Cardiac pacemaker 11/17/2016  . Eczema 11/17/2016  . Major depression, recurrent, chronic ([place]) 09/17/2016  . Axillary abscess 07/22/2016  . Chronic left shoulder pain 10/01/2015  . Xerosis cutis 10/01/2015  . Paroxysmal atrial fibrillation ([place]) 05/24/2014  . PAF (paroxysmal atrial fibrillation) ([place]) 05/24/2014  . NAFLD (nonalcoholic fatty liver disease) 03/29/2014  . Renal cyst 03/29/2014  . Elevated TSH 03/06/2014  . Type 2 diabetes mellitus without complication, without long-term current use of insulin ([place]) 03/06/2014  . Peripheral neuropathy 04/27/2013  . Vitamin B12 deficiency 04/27/2013  . Abnormality of gait 03/29/2013  . Diabetic neuropathy ([place]) 03/29/2013  . OSA (obstructive sleep apnea) 08/26/2012  . Chronic asthmatic bronchitis ([place]) 08/26/2012  . Preop cardiovascular exam 08/15/2012  . Nocturnal hypoxia 04/08/2012  . Seborrheic dermatitis 10/21/2011  . IBS (irritable bowel syndrome) 05/27/2011  . Esophageal reflux 03/27/2011  . Mixed hyperlipidemia 03/27/2011  . Chronic obstructive  pulmonary disease ([place]) 03/27/2011  .  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 Essential hypertension 02/09/2011  . Obesity 02/09/2011  . Tobacco use disorder 02/05/2011  . Vitamin D deficiency 12/30/2010  . Osteoarthrosis involving lower leg 12/26/2010  . Allergic rhinitis 01/23/2009  . Mixed incontinence 10/08/2008    Standley Brooking, PTA 03/11/2021, 11:50 AM  [facility] [address] [place], [place], [address] Phone: [phone]   Fax:  [phone]  Name: Brandi Hunter MRN: [id number] Date of Birth: June 08, 1941

## 2021-03-11 NOTE — Patient Instructions (Signed)
[facility]  DRY NEEDLING CONSENT FORM   Trigger point dry needling is a physical therapy approach to treat Myofascial Pain and Dysfunction.  Dry Needling (DN) is a valuable and effective way to deactivate myofascial trigger points (muscle knots). It is skilled intervention that uses a thin filiform needle to penetrate the skin and stimulate underlying myofascial trigger points, muscular, and connective tissues for the management of neuromusculoskeletal pain and movement impairments.  A local twitch response (LTR) will be elicited.  This can sometimes feel like a deep ache in the muscle during the procedure. Multiple trigger points in multiple muscles can be treated during each treatment.  No medication of any kind is injected.   As with any medical treatment and procedure, there are possible adverse events.  While significant adverse events are uncommon, they do sometimes occur and must be considered prior to giving consent.  1. Dry needling often causes a "post needling soreness".  There can be an increase in pain from a couple of hours to 2-3 days, followed by an improvement in the overall pain state. 2. Any time a needle is used there is a risk of infection.  However, we are using new, sterile, and disposable needles; infections are extremely rare. 3. There is a possibility that you may bleed or bruise.  You may feel tired and some nausea following treatment. 4. There is a rare possibility of a pneumothorax (air in the chest cavity). 5. Allergic reaction to nickel in the stainless steel needle. 6. If a nerve is touched, it may cause paresthesia (a prickling/shock sensation) which is usually brief, but may continue for a couple of days.  Following treatment stay hydrated.  Continue regular activities but not too vigorous initially after treatment for 24-48 hours. You may apply heat to sore muscles.  Dry Needling is best when combined with other physical therapy  interventions such as strengthening, stretching and other therapeutic modalities.     PLEASE ANSWER THE FOLLOWING QUESTIONS:  Do you have a lack of sensation?   Y/N  Do you have a phobia or fear of needles  Y/N  Are you pregnant?    Y/N If yes:  How many weeks? _____  Do you have any implanted devices?  Y/N If yes:  Pacemaker/Spinal Cord         Stimulator/Deep Brain         Stimulator/Insulin          Pump/Other: ____________ Do you have any implants?   Y/N If yes:      Do you take any blood thinners?   Y/N If yes: Coumadin          (Warfarin)/Other:  Do you have a bleeding disorder?   Y/N If yes: What kind:   Do you take any immunosuppressants?  Y/N If yes:   What kind:   Do you take anti-inflammatories?   Y/N If yes: What kind:  Have you ever been diagnosed with Scoliosis? Y/N  Have you had back surgery?    Y/N If yes:         Laminectomy/Fusion/Other:   I have read, or had read to me, the above.  I have had the opportunity to ask any questions.  All of my questions have been answered to my satisfaction and I understand the risks involved with dry needling.  I consent to examination and treatment at [facility], including dry needling, of any and all of my involved and affected   muscles.   

## 2021-03-13 ENCOUNTER — Ambulatory Visit: Payer: Medicare HMO | Admitting: Physical Therapy

## 2021-03-13 ENCOUNTER — Other Ambulatory Visit: Payer: Self-pay

## 2021-03-13 DIAGNOSIS — R293 Abnormal posture: Secondary | ICD-10-CM

## 2021-03-13 DIAGNOSIS — M542 Cervicalgia: Secondary | ICD-10-CM

## 2021-03-13 NOTE — Therapy (Signed)
[facility] [place], [place], [address] Phone: [phone]   Fax:  [phone]  Physical Therapy Treatment  Patient Details  Name: Brandi Hunter MRN: [id number] Date of Birth: 1941/02/22 Referring Provider (PT): Dorene Ar MD   Encounter Date: 03/13/2021   PT End of Session - 03/13/21 1107    Visit Number 5    Number of Visits 12    Date for PT Re-Evaluation 04/09/21    Authorization Type FOTO AT LEAST EVERY 5TH VISIT.  PROGRESS NOTE AT 10TH VISIT.  KX MODIFIER AFTER 15 VISITS.    PT Start Time 1030    PT Stop Time 1115    PT Time Calculation (min) 45 min    Activity Tolerance Patient tolerated treatment well    Behavior During Therapy WFL for tasks assessed/performed           Past Medical History:  Diagnosis Date  . Asthma   . CAD (coronary artery disease)    a. catheterization in 03/2018 showing 90% ostial OM stenosis with medical management recommended at that time  . Chronic anticoagulation   . COPD (chronic obstructive pulmonary disease) ([place])   . DJD (degenerative joint disease)   . Essential hypertension   . GERD (gastroesophageal reflux disease)   . History of home oxygen therapy   . Hyperlipidemia   . Mobitz (type) II atrioventricular block   . Obesity   . Pacemaker    Implanted by Dr [name] (MDT) 10/06/10  . PAF (paroxysmal atrial fibrillation) ([place])   . Pancreatitis 2010 OR 2011  . Sleep apnea    CPAP  . Type 2 diabetes mellitus ([place])     Past Surgical History:  Procedure Laterality Date  . CARDIOVERSION N/A 08/18/2019   Procedure: CARDIOVERSION;  Surgeon: Lelon Perla, MD;  Location: [facility] ENDOSCOPY;  Service: Cardiovascular;  Laterality: N/A;  . CATARACT EXTRACTION W/PHACO  11/02/2011   Procedure: CATARACT EXTRACTION PHACO AND INTRAOCULAR LENS PLACEMENT ([place]);  Surgeon: Williams Che;  [facility];  Service: Ophthalmology;  Laterality: Right;  CDE=7.33  . CATARACT EXTRACTION  W/PHACO  12/07/2011   Procedure: CATARACT EXTRACTION PHACO AND INTRAOCULAR LENS PLACEMENT (IOC);  Surgeon: Williams Che, MD;  Location: [facility];  Service: Ophthalmology;  Laterality: Left;  CDE 3.61  . COLONOSCOPY WITH PROPOFOL N/A 08/09/2014   Procedure: COLONOSCOPY WITH PROPOFOL;  Surgeon: Juanita Craver, MD;  [facility];  Service: Endoscopy;  Laterality: N/A;  . CYSTOCELE REPAIR    . ESOPHAGOGASTRODUODENOSCOPY (EGD) WITH PROPOFOL N/A 08/09/2014   Procedure: ESOPHAGOGASTRODUODENOSCOPY (EGD) WITH PROPOFOL;  Surgeon: Juanita Craver, MD;  [facility];  Service: Endoscopy;  Laterality: N/A;  . EYE SURGERY  11/01/2012   BOTH EYES CATARACTS  . INSERT / REPLACE / REMOVE PACEMAKER  10/06/10   MDT  implanted by Dr [name]  . KNEE ARTHROSCOPY     both  . LEFT HEART CATH AND CORONARY ANGIOGRAPHY N/A 03/24/2018   Procedure: LEFT HEART CATH AND CORONARY ANGIOGRAPHY;  Surgeon: Lorretta Harp, MD;  Location: [facility];  Service: Cardiovascular;  Laterality: N/A;  . OVARY SURGERY     removal  . REPAIR RECTOCELE    . SIMPLE MASTECTOMY WITH AXILLARY SENTINEL NODE BIOPSY Left 01/09/2015   Procedure: Irrigation and Drainage Abcess left Axilla;  Surgeon: Jackolyn Confer, MD;  [facility];  Service: General;  Laterality: Left;  . TONSILLECTOMY  AGE 37  . TOTAL ABDOMINAL HYSTERECTOMY  1971  There were no vitals filed for this visit.   Subjective Assessment - 03/13/21 1108    Subjective COVID-19 screen performed prior to patient entering clinic.  Neck is much better.    Pertinent History Bilateral knee ATS, h/o LBP, PACEMAKER, latex and bee sting allergies.    Patient Stated Goals Use right UE without much pain.    Currently in Pain? Yes    Pain Score 1                              [facility] Adult PT Treatment/Exercise - 03/13/21 0001      Moist Heat Therapy   Number Minutes Moist Heat 15 Minutes    Moist Heat Location --   RT cervical.     Manual  Therapy   Manual Therapy Soft tissue mobilization    Soft tissue mobilization STW/M x 23 minutes to patient's affected right cervical musculature with ischemic release technique to right UT.                       PT Long Term Goals - 03/11/21 1149      PT LONG TERM GOAL #1   Title Independent with a HEP.    Time 6    Period Weeks    Status Achieved      PT LONG TERM GOAL #2   Title Increase active cervical rotation to 60 degrees+ so patient can turn head more easily while driving.    Time 6    Period Weeks    Status On-going      PT LONG TERM GOAL #3   Title Eliminate right UE symptoms.    Time 6    Period Weeks    Status Partially Met      PT LONG TERM GOAL #4   Title Perform ADL's with neck pain not > 2-3/10.    Time 6    Period Weeks    Status On-going                 Plan - 03/13/21 1114    Clinical Impression Statement Excellent progress with regards to decreasing neck pain.  Upon presentation her right-sided neck pain was a 1/10.  She would like to begin treatment to her low back next week.    Personal Factors and Comorbidities Comorbidity 1;Other;Comorbidity 2    Comorbidities Bilateral knee ATS, h/o LBP, PACEMAKER, latex and bee sting allergies.           Patient will benefit from skilled therapeutic intervention in order to improve the following deficits and impairments:     Visit Diagnosis: Cervicalgia  Abnormal posture     Problem List Patient Active Problem List   Diagnosis Date Noted  . Acute on chronic diastolic (congestive) heart failure ([place]) 05/06/2020  . Acute on chronic diastolic congestive heart failure ([place]) 05/05/2020  . Hypomagnesemia 05/05/2020  . CKD (chronic kidney disease), stage III ([place]) 05/05/2020  . Persistent atrial fibrillation ([place]) 02/20/2020  . Secondary hypercoagulable state ([place]) 02/20/2020  . Acute and chronic respiratory failure (acute-on-chronic) ([place]) 08/30/2019  . COPD exacerbation ([place])  08/29/2019  . Chest pain 07/18/2018  . Sepsis due to urinary tract infection ([place]) 06/30/2018  . AKI (acute kidney injury) ([place]) 06/30/2018  . Sepsis, unspecified organism ([place]) 06/30/2018  . Normocytic anemia 06/30/2018  . Coronary artery disease due to lipid rich plaque   . Unstable angina ([place]) 03/23/2018  .  Acute respiratory distress 09/26/2017  . Acute exacerbation of chronic obstructive pulmonary disease (COPD) ([place]) 09/26/2017  . 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 Type 2 diabetes mellitus ([place]) 09/26/2017  . Acute on chronic diastolic CHF (congestive heart failure) ([place]) 09/26/2017  . Cardiac pacemaker 11/17/2016  . Eczema 11/17/2016  . Major depression, recurrent, chronic ([place]) 09/17/2016  . Axillary abscess 07/22/2016  . Chronic left shoulder pain 10/01/2015  . Xerosis cutis 10/01/2015  . Paroxysmal atrial fibrillation ([place]) 05/24/2014  . PAF (paroxysmal atrial fibrillation) ([place]) 05/24/2014  . NAFLD (nonalcoholic fatty liver disease) 03/29/2014  . Renal cyst 03/29/2014  . Elevated TSH 03/06/2014  . Type 2 diabetes mellitus without complication, without long-term current use of insulin ([place]) 03/06/2014  . Peripheral neuropathy 04/27/2013  . Vitamin B12 deficiency 04/27/2013  . Abnormality of gait 03/29/2013  . Diabetic neuropathy ([place]) 03/29/2013  . OSA (obstructive sleep apnea) 08/26/2012  . Chronic asthmatic bronchitis ([place]) 08/26/2012  . Preop cardiovascular exam 08/15/2012  . Nocturnal hypoxia 04/08/2012  . Seborrheic dermatitis 10/21/2011  . IBS (irritable bowel syndrome) 05/27/2011  . Esophageal reflux 03/27/2011  . Mixed hyperlipidemia 03/27/2011  . Chronic obstructive pulmonary disease ([place]) 03/27/2011  . 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 Essential hypertension 02/09/2011  . Obesity 02/09/2011  . Tobacco use disorder 02/05/2011  . Vitamin D deficiency 12/30/2010  . Osteoarthrosis  involving lower leg 12/26/2010  . Allergic rhinitis 01/23/2009  . Mixed incontinence 10/08/2008    Loneta Tamplin, [place] MPT 03/13/2021, 11:16 AM  [facility] [address] [place], [place], [address] Phone: [phone]   Fax:  [phone]  Name: LORILYN LAITINEN MRN: [id number] Date of Birth: 1941/05/22

## 2021-03-18 ENCOUNTER — Other Ambulatory Visit: Payer: Self-pay

## 2021-03-18 ENCOUNTER — Ambulatory Visit: Payer: Medicare HMO | Attending: Anesthesiology | Admitting: Physical Therapy

## 2021-03-18 DIAGNOSIS — R293 Abnormal posture: Secondary | ICD-10-CM | POA: Insufficient documentation

## 2021-03-18 DIAGNOSIS — M542 Cervicalgia: Secondary | ICD-10-CM

## 2021-03-18 DIAGNOSIS — M545 Low back pain, unspecified: Secondary | ICD-10-CM | POA: Diagnosis present

## 2021-03-18 DIAGNOSIS — G8929 Other chronic pain: Secondary | ICD-10-CM | POA: Diagnosis present

## 2021-03-18 NOTE — Therapy (Signed)
[facility] [place], [place], [address] Phone: [phone]   Fax:  [phone]  Physical Therapy Treatment  Patient Details  Name: Brandi Hunter MRN: [id number] Date of Birth: 04/20/1941 Referring Provider (PT): Dorene Ar MD   Encounter Date: 03/18/2021   PT End of Session - 03/18/21 1249    Visit Number 6    Number of Visits 12    Date for PT Re-Evaluation 04/09/21    Authorization Type FOTO AT LEAST EVERY 5TH VISIT.  PROGRESS NOTE AT 10TH VISIT.  KX MODIFIER AFTER 15 VISITS.    PT Start Time 1030    PT Stop Time 1115    PT Time Calculation (min) 45 min    Activity Tolerance Patient tolerated treatment well    Behavior During Therapy WFL for tasks assessed/performed           Past Medical History:  Diagnosis Date  . Asthma   . CAD (coronary artery disease)    a. catheterization in 03/2018 showing 90% ostial OM stenosis with medical management recommended at that time  . Chronic anticoagulation   . COPD (chronic obstructive pulmonary disease) ([place])   . DJD (degenerative joint disease)   . Essential hypertension   . GERD (gastroesophageal reflux disease)   . History of home oxygen therapy   . Hyperlipidemia   . Mobitz (type) II atrioventricular block   . Obesity   . Pacemaker    Implanted by Dr [name] (MDT) 10/06/10  . PAF (paroxysmal atrial fibrillation) ([place])   . Pancreatitis 2010 OR 2011  . Sleep apnea    CPAP  . Type 2 diabetes mellitus ([place])     Past Surgical History:  Procedure Laterality Date  . CARDIOVERSION N/A 08/18/2019   Procedure: CARDIOVERSION;  Surgeon: Lelon Perla, MD;  Location: [facility] ENDOSCOPY;  Service: Cardiovascular;  Laterality: N/A;  . CATARACT EXTRACTION W/PHACO  11/02/2011   Procedure: CATARACT EXTRACTION PHACO AND INTRAOCULAR LENS PLACEMENT ([place]);  Surgeon: Williams Che;  Location: [facility] ORS;  Service: Ophthalmology;  Laterality: Right;  CDE=7.33  . CATARACT EXTRACTION  W/PHACO  12/07/2011   Procedure: CATARACT EXTRACTION PHACO AND INTRAOCULAR LENS PLACEMENT (IOC);  Surgeon: Williams Che, MD;  Location: [facility] ORS;  Service: Ophthalmology;  Laterality: Left;  CDE 3.61  . COLONOSCOPY WITH PROPOFOL N/A 08/09/2014   Procedure: COLONOSCOPY WITH PROPOFOL;  Surgeon: Juanita Craver, MD;  Location: [facility] ENDOSCOPY;  Service: Endoscopy;  Laterality: N/A;  . CYSTOCELE REPAIR    . ESOPHAGOGASTRODUODENOSCOPY (EGD) WITH PROPOFOL N/A 08/09/2014   Procedure: ESOPHAGOGASTRODUODENOSCOPY (EGD) WITH PROPOFOL;  Surgeon: Juanita Craver, MD;  Location: [facility] ENDOSCOPY;  Service: Endoscopy;  Laterality: N/A;  . EYE SURGERY  11/01/2012   BOTH EYES CATARACTS  . INSERT / REPLACE / REMOVE PACEMAKER  10/06/10   MDT  implanted by Dr [name]  . KNEE ARTHROSCOPY     both  . LEFT HEART CATH AND CORONARY ANGIOGRAPHY N/A 03/24/2018   Procedure: LEFT HEART CATH AND CORONARY ANGIOGRAPHY;  Surgeon: Lorretta Harp, MD;  Location: [place] CV LAB;  Service: Cardiovascular;  Laterality: N/A;  . OVARY SURGERY     removal  . REPAIR RECTOCELE    . SIMPLE MASTECTOMY WITH AXILLARY SENTINEL NODE BIOPSY Left 01/09/2015   Procedure: Irrigation and Drainage Abcess left Axilla;  Surgeon: Jackolyn Confer, MD;  Location: [facility] ORS;  Service: General;  Laterality: Left;  . TONSILLECTOMY  AGE 80  . TOTAL ABDOMINAL HYSTERECTOMY  1971  There were no vitals filed for this visit.   Subjective Assessment - 03/18/21 1248    Subjective COVID-19 screen performed prior to patient entering clinic.  Neck doing good.    Pertinent History Bilateral knee ATS, h/o LBP, PACEMAKER, latex and bee sting allergies.    Patient Stated Goals Use right UE without much pain.    Currently in Pain? Yes    Pain Location Neck    Pain Orientation Right    Pain Descriptors / Indicators Discomfort;Dull    Pain Onset More than a month ago                             [facility] Adult PT Treatment/Exercise - 03/18/21 0001       Modalities   Modalities Moist Heat      Moist Heat Therapy   Number Minutes Moist Heat 15 Minutes    Moist Heat Location --   Right cervical.     Manual Therapy   Manual Therapy Soft tissue mobilization    Soft tissue mobilization STW/m x 23 minutes to patient's right cervical musculature.                       PT Long Term Goals - 03/11/21 1149      PT LONG TERM GOAL #1   Title Independent with a HEP.    Time 6    Period Weeks    Status Achieved      PT LONG TERM GOAL #2   Title Increase active cervical rotation to 60 degrees+ so patient can turn head more easily while driving.    Time 6    Period Weeks    Status On-going      PT LONG TERM GOAL #3   Title Eliminate right UE symptoms.    Time 6    Period Weeks    Status Partially Met      PT LONG TERM GOAL #4   Title Perform ADL's with neck pain not > 2-3/10.    Time 6    Period Weeks    Status On-going                 Plan - 03/18/21 1252    Clinical Impression Statement No pain complaints following treatment.  Discharge treatment to patient's neck next visit with plan to begin lumbar region next week.    Personal Factors and Comorbidities Comorbidity 1;Other;Comorbidity 2    Comorbidities Bilateral knee ATS, h/o LBP, PACEMAKER, latex and bee sting allergies.    Examination-Activity Limitations Other    Examination-Participation Restrictions Other    Stability/Clinical Decision Making Evolving/Moderate complexity    Rehab Potential Good    PT Frequency 2x / week    PT Duration 6 weeks    PT Treatment/Interventions ADLs/Self Care Home Management;Traction;Moist Heat;Therapeutic activities;Therapeutic exercise;Manual techniques;Patient/family education;Passive range of motion;Dry needling    Consulted and Agree with Plan of Care Patient           Patient will benefit from skilled therapeutic intervention in order to improve the following deficits and impairments:  Pain,Increased muscle  spasms,Decreased activity tolerance,Decreased range of motion  Visit Diagnosis: Cervicalgia     Problem List Patient Active Problem List   Diagnosis Date Noted  . Acute on chronic diastolic (congestive) heart failure ([place]) 05/06/2020  . Acute on chronic diastolic congestive heart failure ([place]) 05/05/2020  . Hypomagnesemia 05/05/2020  . CKD (chronic kidney  disease), stage III ([place]) 05/05/2020  . Persistent atrial fibrillation ([place]) 02/20/2020  . Secondary hypercoagulable state ([place]) 02/20/2020  . Acute and chronic respiratory failure (acute-on-chronic) ([place]) 08/30/2019  . COPD exacerbation ([place]) 08/29/2019  . Chest pain 07/18/2018  . Sepsis due to urinary tract infection ([place]) 06/30/2018  . AKI (acute kidney injury) ([place]) 06/30/2018  . Sepsis, unspecified organism ([place]) 06/30/2018  . Normocytic anemia 06/30/2018  . Coronary artery disease due to lipid rich plaque   . Unstable angina ([place]) 03/23/2018  . Acute respiratory distress 09/26/2017  . Acute exacerbation of chronic obstructive pulmonary disease (COPD) ([place]) 09/26/2017  . 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 Type 2 diabetes mellitus ([place]) 09/26/2017  . Acute on chronic diastolic CHF (congestive heart failure) ([place]) 09/26/2017  . Cardiac pacemaker 11/17/2016  . Eczema 11/17/2016  . Major depression, recurrent, chronic ([place]) 09/17/2016  . Axillary abscess 07/22/2016  . Chronic left shoulder pain 10/01/2015  . Xerosis cutis 10/01/2015  . Paroxysmal atrial fibrillation ([place]) 05/24/2014  . PAF (paroxysmal atrial fibrillation) ([place]) 05/24/2014  . NAFLD (nonalcoholic fatty liver disease) 03/29/2014  . Renal cyst 03/29/2014  . Elevated TSH 03/06/2014  . Type 2 diabetes mellitus without complication, without long-term current use of insulin ([place]) 03/06/2014  . Peripheral neuropathy 04/27/2013  . Vitamin B12 deficiency 04/27/2013  . Abnormality of gait 03/29/2013  . Diabetic neuropathy ([place]) 03/29/2013  . OSA  (obstructive sleep apnea) 08/26/2012  . Chronic asthmatic bronchitis ([place]) 08/26/2012  . Preop cardiovascular exam 08/15/2012  . Nocturnal hypoxia 04/08/2012  . Seborrheic dermatitis 10/21/2011  . IBS (irritable bowel syndrome) 05/27/2011  . Esophageal reflux 03/27/2011  . Mixed hyperlipidemia 03/27/2011  . Chronic obstructive pulmonary disease ([place]) 03/27/2011  . 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 Essential hypertension 02/09/2011  . Obesity 02/09/2011  . Tobacco use disorder 02/05/2011  . Vitamin D deficiency 12/30/2010  . Osteoarthrosis involving lower leg 12/26/2010  . Allergic rhinitis 01/23/2009  . Mixed incontinence 10/08/2008    Vallen Calabrese, [place] MPT 03/18/2021, 12:54 PM  [facility] [address] [place], [place], [address] Phone: [phone]   Fax:  [phone]  Name: Brandi Hunter MRN: [id number] Date of Birth: [birth year]

## 2021-03-19 ENCOUNTER — Other Ambulatory Visit: Payer: Self-pay | Admitting: Cardiology

## 2021-03-20 ENCOUNTER — Ambulatory Visit: Payer: Medicare HMO | Admitting: Physical Therapy

## 2021-03-20 ENCOUNTER — Encounter: Payer: Self-pay | Admitting: Physical Therapy

## 2021-03-20 ENCOUNTER — Other Ambulatory Visit: Payer: Self-pay

## 2021-03-20 DIAGNOSIS — R293 Abnormal posture: Secondary | ICD-10-CM

## 2021-03-20 DIAGNOSIS — M542 Cervicalgia: Secondary | ICD-10-CM

## 2021-03-20 NOTE — Therapy (Addendum)
[facility] [place], [place], [address] Phone: [phone]   Fax:  [phone]  Physical Therapy Treatment  Patient Details  Name: Brandi Hunter MRN: [id number] Date of Birth: 06/15/1941 Referring Provider (PT): Dorene Ar MD   Encounter Date: 03/20/2021   PT End of Session - 03/20/21 1048    Visit Number 7    Number of Visits 12    Date for PT Re-Evaluation 04/09/21    Authorization Type FOTO AT LEAST EVERY 5TH VISIT.  PROGRESS NOTE AT 10TH VISIT.  KX MODIFIER AFTER 15 VISITS.    PT Start Time 1033    PT Stop Time 1115    PT Time Calculation (min) 42 min    Activity Tolerance Patient tolerated treatment well    Behavior During Therapy WFL for tasks assessed/performed           Past Medical History:  Diagnosis Date  . Asthma   . CAD (coronary artery disease)    a. catheterization in 03/2018 showing 90% ostial OM stenosis with medical management recommended at that time  . Chronic anticoagulation   . COPD (chronic obstructive pulmonary disease) ([place])   . DJD (degenerative joint disease)   . Essential hypertension   . GERD (gastroesophageal reflux disease)   . History of home oxygen therapy   . Hyperlipidemia   . Mobitz (type) II atrioventricular block   . Obesity   . Pacemaker    Implanted by Dr [name] (MDT) 10/06/10  . PAF (paroxysmal atrial fibrillation) ([place])   . Pancreatitis 2010 OR 2011  . Sleep apnea    CPAP  . Type 2 diabetes mellitus ([place])     Past Surgical History:  Procedure Laterality Date  . CARDIOVERSION N/A 08/18/2019   Procedure: CARDIOVERSION;  Surgeon: Lelon Perla, MD;  Location: [facility] ENDOSCOPY;  Service: Cardiovascular;  Laterality: N/A;  . CATARACT EXTRACTION W/PHACO  11/02/2011   Procedure: CATARACT EXTRACTION PHACO AND INTRAOCULAR LENS PLACEMENT ([place]);  Surgeon: Williams Che;  Location: [facility]S;  Service: Ophthalmology;  Laterality: Right;  CDE=7.33  . CATARACT EXTRACTION  W/PHACO  12/07/2011   Procedure: CATARACT EXTRACTION PHACO AND INTRAOCULAR LENS PLACEMENT (IOC);  Surgeon: Williams Che, MD;  Location: [facility];  Service: Ophthalmology;  Laterality: Left;  CDE 3.61  . COLONOSCOPY WITH PROPOFOL N/A 08/09/2014   Procedure: COLONOSCOPY WITH PROPOFOL;  Surgeon: Juanita Craver, MD;  [facility] ENDOSCOPY;  Service: Endoscopy;  Laterality: N/A;  . CYSTOCELE REPAIR    . ESOPHAGOGASTRODUODENOSCOPY (EGD) WITH PROPOFOL N/A 08/09/2014   Procedure: ESOPHAGOGASTRODUODENOSCOPY (EGD) WITH PROPOFOL;  Surgeon: Juanita Craver, MD;  [facility] ENDOS[facility];  Service: Endoscopy;  Laterality: N/A;  . EYE SURGERY  11/01/2012   BOTH EYES CATARACTS  . INSERT / REPLACE / REMOVE PACEMAKER  10/06/10   MDT  implanted by Dr [name]  . KNEE ARTHROSCOPY     both  . LEFT HEART CATH AND CORONARY ANGIOGRAPHY N/A 03/24/2018   Procedure: LEFT HEART CATH AND CORONARY ANGIOGRAPHY;  Surgeon: Lorretta Harp, MD;  Location: [place] CV LAB;  Service: Cardiovascular;  Laterality: N/A;  . OVARY SURGERY     removal  . REPAIR RECTOCELE    . SIMPLE MASTECTOMY WITH AXILLARY SENTINEL NODE BIOPSY Left 01/09/2015   Procedure: Irrigation and Drainage Abcess left Axilla;  Surgeon: Jackolyn Confer, MD;  Location: [facility] ORS;  Service: General;  Laterality: Left;  . TONSILLECTOMY  AGE 44  . TOTAL ABDOMINAL HYSTERECTOMY  1971  There were no vitals filed for this visit.   Subjective Assessment - 03/20/21 1048    Subjective COVID-19 screen performed prior to patient entering clinic.  Neck doing good.    Pertinent History Bilateral knee ATS, h/o LBP, PACEMAKER, latex and bee sting allergies.    Patient Stated Goals Use right UE without much pain.    Currently in Pain? Other (Comment)   No pain assessment provided             [facility] PT Assessment - 03/20/21 0001      Assessment   Medical Diagnosis Cervical Spondylosis.    Referring Provider (PT) Dorene Ar MD    Next MD Visit 03/24/2021       Precautions   Precautions Fall    Precaution Comments PACEMAKER.  Rollator usage and portable 02 at 2L/min.      Restrictions   Weight Bearing Restrictions No      ROM / Strength   AROM / PROM / Strength AROM      AROM   Overall AROM  Within functional limits for tasks performed    AROM Assessment Site Cervical    Cervical - Right Rotation 80    Cervical - Left Rotation 80                         [facility] Adult PT Treatment/Exercise - 03/20/21 0001      Neck Exercises: Machines for Strengthening   Nustep L3 x20 min      Neck Exercises: Seated   Upper Extremity D2 Flexion;20 reps;Theraband    Theraband Level (UE D2) Level 2 (Red)      Shoulder Exercises: Seated   Horizontal ABduction Strengthening;Both;20 reps;Theraband    Theraband Level (Shoulder Horizontal ABduction) Level 2 (Red)    External Rotation Strengthening;Both;20 reps;Theraband    Theraband Level (Shoulder External Rotation) Level 2 (Red)      Manual Therapy   Manual Therapy Soft tissue mobilization    Soft tissue mobilization STW to R posterior cuff, supraspinatus to reduce discomfort                       PT Long Term Goals - 03/20/21 1106      PT LONG TERM GOAL #1   Title Independent with a HEP.    Time 6    Period Weeks    Status Achieved      PT LONG TERM GOAL #2   Title Increase active cervical rotation to 60 degrees+ so patient can turn head more easily while driving.    Time 6    Period Weeks    Status Achieved      PT LONG TERM GOAL #3   Title Eliminate right UE symptoms.    Time 6    Period Weeks    Status Partially Met      PT LONG TERM GOAL #4   Title Perform ADL's with neck pain not > 2-3/10.    Time 6    Period Weeks    Status Achieved                 Plan - 03/20/21 1517    Clinical Impression Statement Patient presented in clinic with overall improvement in cervical pain and limitations. Patient able to tolerate postural strengthening with  only reports of some R shoulder discomfort. Patient able to achieve most LTGs except for having very occasional UE symptoms per patient report. STW  completed to R posterior cuff and supraspinatus to reduce discomfort.    Personal Factors and Comorbidities Comorbidity 1;Other;Comorbidity 2    Comorbidities Bilateral knee ATS, h/o LBP, PACEMAKER, latex and bee sting allergies.    Examination-Activity Limitations Other    Examination-Participation Restrictions Other    Stability/Clinical Decision Making Evolving/Moderate complexity    Rehab Potential Good    PT Frequency 2x / week    PT Duration 6 weeks    PT Treatment/Interventions ADLs/Self Care Home Management;Traction;Moist Heat;Therapeutic activities;Therapeutic exercise;Manual techniques;Patient/family education;Passive range of motion;Dry needling    PT Next Visit Plan D/C for cervical.    Consulted and Agree with Plan of Care Patient           Patient will benefit from skilled therapeutic intervention in order to improve the following deficits and impairments:  Pain,Increased muscle spasms,Decreased activity tolerance,Decreased range of motion  Visit Diagnosis: Cervicalgia  Abnormal posture     Problem List Patient Active Problem List   Diagnosis Date Noted  . Acute on chronic diastolic (congestive) heart failure ([place]) 05/06/2020  . Acute on chronic diastolic congestive heart failure ([place]) 05/05/2020  . Hypomagnesemia 05/05/2020  . CKD (chronic kidney disease), stage III ([place]) 05/05/2020  . Persistent atrial fibrillation ([place]) 02/20/2020  . Secondary hypercoagulable state ([place]) 02/20/2020  . Acute and chronic respiratory failure (acute-on-chronic) ([place]) 08/30/2019  . COPD exacerbation ([place]) 08/29/2019  . Chest pain 07/18/2018  . Sepsis due to urinary tract infection ([place]) 06/30/2018  . AKI (acute kidney injury) ([place]) 06/30/2018  . Sepsis, unspecified organism ([place]) 06/30/2018  . Normocytic anemia 06/30/2018  . Coronary  artery disease due to lipid rich plaque   . Unstable angina ([place]) 03/23/2018  . Acute respiratory distress 09/26/2017  . Acute exacerbation of chronic obstructive pulmonary disease (COPD) ([place]) 09/26/2017  . 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 Type 2 diabetes mellitus ([place]) 09/26/2017  . Acute on chronic diastolic CHF (congestive heart failure) ([place]) 09/26/2017  . Cardiac pacemaker 11/17/2016  . Eczema 11/17/2016  . Major depression, recurrent, chronic ([place]) 09/17/2016  . Axillary abscess 07/22/2016  . Chronic left shoulder pain 10/01/2015  . Xerosis cutis 10/01/2015  . Paroxysmal atrial fibrillation ([place]) 05/24/2014  . PAF (paroxysmal atrial fibrillation) ([place]) 05/24/2014  . NAFLD (nonalcoholic fatty liver disease) 03/29/2014  . Renal cyst 03/29/2014  . Elevated TSH 03/06/2014  . Type 2 diabetes mellitus without complication, without long-term current use of insulin ([place]) 03/06/2014  . Peripheral neuropathy 04/27/2013  . Vitamin B12 deficiency 04/27/2013  . Abnormality of gait 03/29/2013  . Diabetic neuropathy ([place]) 03/29/2013  . OSA (obstructive sleep apnea) 08/26/2012  . Chronic asthmatic bronchitis ([place]) 08/26/2012  . Preop cardiovascular exam 08/15/2012  . Nocturnal hypoxia 04/08/2012  . Seborrheic dermatitis 10/21/2011  . IBS (irritable bowel syndrome) 05/27/2011  . Esophageal reflux 03/27/2011  . Mixed hyperlipidemia 03/27/2011  . Chronic obstructive pulmonary disease ([place]) 03/27/2011  . 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 Essential hypertension 02/09/2011  . Obesity 02/09/2011  . Tobacco use disorder 02/05/2011  . Vitamin D deficiency 12/30/2010  . Osteoarthrosis involving lower leg 12/26/2010  . Allergic rhinitis 01/23/2009  . Mixed incontinence 10/08/2008    Standley Brooking, PTA 03/20/21 3:32 PM   [facility] [address] [place], [place],  [address] Phone: [phone]   Fax:  [phone]  Name: Brandi Hunter MRN: [id number] Date of Birth: 12/14/1940  PHYSICAL THERAPY DISCHARGE SUMMARY  Visits from  Start of Care: 7  Current functional level related to goals / functional outcomes: See above.   Remaining deficits: See goal section.   Education / Equipment: HEP. Plan: Patient agrees to discharge.  Patient goals were partially met. Patient is being discharged due to being pleased with the current functional level.  ?????         Mali Applegate MPT

## 2021-03-25 ENCOUNTER — Other Ambulatory Visit: Payer: Self-pay

## 2021-03-25 ENCOUNTER — Ambulatory Visit: Payer: Medicare HMO | Admitting: Physical Therapy

## 2021-03-25 DIAGNOSIS — G8929 Other chronic pain: Secondary | ICD-10-CM

## 2021-03-25 DIAGNOSIS — M545 Low back pain, unspecified: Secondary | ICD-10-CM

## 2021-03-25 DIAGNOSIS — M542 Cervicalgia: Secondary | ICD-10-CM | POA: Diagnosis not present

## 2021-03-25 NOTE — Therapy (Signed)
[facility] [place], [place], [address] Phone: [phone]   Fax:  [phone]  Physical Therapy Treatment  Patient Details  Name: Brandi Hunter MRN: [id number] Date of Birth: Mar 26, 1941 Referring Provider (PT): Dorene Ar MD   Encounter Date: 03/25/2021   PT End of Session - 03/25/21 1420    Visit Number 8    Number of Visits 12    Date for PT Re-Evaluation 04/09/21    Authorization Type FOTO AT LEAST EVERY 5TH VISIT.  PROGRESS NOTE AT 10TH VISIT.  KX MODIFIER AFTER 15 VISITS.    PT Start Time 0145    PT Stop Time 0218    PT Time Calculation (min) 33 min    Activity Tolerance Patient tolerated treatment well    Behavior During Therapy [facility] for tasks assessed/performed           Past Medical History:  Diagnosis Date  . Asthma   . CAD (coronary artery disease)    a. catheterization in 03/2018 showing 90% ostial OM stenosis with medical management recommended at that time  . Chronic anticoagulation   . COPD (chronic obstructive pulmonary disease) ([place])   . DJD (degenerative joint disease)   . Essential hypertension   . GERD (gastroesophageal reflux disease)   . History of home oxygen therapy   . Hyperlipidemia   . Mobitz (type) II atrioventricular block   . Obesity   . Pacemaker    Implanted by Dr [name] (MDT) 10/06/10  . PAF (paroxysmal atrial fibrillation) ([place])   . Pancreatitis 2010 OR 2011  . Sleep apnea    CPAP  . Type 2 diabetes mellitus ([place])     Past Surgical History:  Procedure Laterality Date  . CARDIOVERSION N/A 08/18/2019   Procedure: CARDIOVERSION;  Surgeon: Lelon Perla, MD;  Location: [facility] ENDOSCOPY;  Service: Cardiovascular;  Laterality: N/A;  . CATARACT EXTRACTION W/PHACO  11/02/2011   Procedure: CATARACT EXTRACTION PHACO AND INTRAOCULAR LENS PLACEMENT ([place]);  Surgeon: Williams Che;  Location: [facility] ORS;  Service: Ophthalmology;  Laterality: Right;  CDE=7.33  . CATARACT EXTRACTION  W/PHACO  12/07/2011   Procedure: CATARACT EXTRACTION PHACO AND INTRAOCULAR LENS PLACEMENT (IOC);  Surgeon: Williams Che, MD;  Location: [facility] ORS;  Service: Ophthalmology;  Laterality: Left;  CDE 3.61  . COLONOSCOPY WITH PROPOFOL N/A 08/09/2014   Procedure: COLONOSCOPY WITH PROPOFOL;  Surgeon: Juanita Craver, MD;  Location: [facility] ENDOSCOPY;  Service: Endoscopy;  Laterality: N/A;  . CYSTOCELE REPAIR    . ESOPHAGOGASTRODUODENOSCOPY (EGD) WITH PROPOFOL N/A 08/09/2014   Procedure: ESOPHAGOGASTRODUODENOSCOPY (EGD) WITH PROPOFOL;  Surgeon: Juanita Craver, MD;  Location: [facility] ENDOSCOPY;  Service: Endoscopy;  Laterality: N/A;  . EYE SURGERY  11/01/2012   BOTH EYES CATARACTS  . INSERT / REPLACE / REMOVE PACEMAKER  10/06/10   MDT  implanted by Dr [name]  . KNEE ARTHROSCOPY     both  . LEFT HEART CATH AND CORONARY ANGIOGRAPHY N/A 03/24/2018   Procedure: LEFT HEART CATH AND CORONARY ANGIOGRAPHY;  Surgeon: Lorretta Harp, MD;  Location: [place] CV LAB;  Service: Cardiovascular;  Laterality: N/A;  . OVARY SURGERY     removal  . REPAIR RECTOCELE    . SIMPLE MASTECTOMY WITH AXILLARY SENTINEL NODE BIOPSY Left 01/09/2015   Procedure: Irrigation and Drainage Abcess left Axilla;  Surgeon: Jackolyn Confer, MD;  Location: [facility] ORS;  Service: General;  Laterality: Left;  . TONSILLECTOMY  AGE 84  . TOTAL ABDOMINAL HYSTERECTOMY  1971  There were no vitals filed for this visit.   Subjective Assessment - 03/25/21 1409    Subjective See 'Clinical Impression statement" under plan section.    Pertinent History Bilateral knee ATS, h/o LBP, PACEMAKER, latex and bee sting allergies.    Patient Stated Goals Use right UE without much pain.                             [facility] Adult PT Treatment/Exercise - 03/25/21 0001      Manual Therapy   Manual Therapy Soft tissue mobilization    Soft tissue mobilization STW/M x 8 minutes to right SIJ region.                       PT Long Term Goals -  03/25/21 1421      PT LONG TERM GOAL #5   Title Decrease right LBP-level to no higher than a 3/10 when performing ADL's.    Time 6    Period Weeks    Status New                 Plan - 03/25/21 1409    Clinical Impression Statement The patient reports a flare-up of her right sided low back pain a couple of weeks ago going as high as a 7/10.  She demonstrates full lumbar flexion and extension to 15 degrees.  Her LE strength is normal.  Patellar reflexes are normal and Achilles are absent.  Her leg lengths are equal and she demonstrates a negative Slump test.  She is tender to palption over her right SIJ.Patient will benefit from skilled physical therapy intervention to address pain and deficits.    Personal Factors and Comorbidities Comorbidity 1;Other;Comorbidity 2    Comorbidities Bilateral knee ATS, h/o LBP, PACEMAKER, latex and bee sting allergies.    Examination-Activity Limitations Other    Stability/Clinical Decision Making Stable/Uncomplicated    Clinical Decision Making Low    Rehab Potential Good    PT Frequency 2x / week    PT Duration 6 weeks    PT Treatment/Interventions ADLs/Self Care Home Management;Traction;Moist Heat;Therapeutic activities;Therapeutic exercise;Manual techniques;Patient/family education;Passive range of motion    Consulted and Agree with Plan of Care Patient           Patient will benefit from skilled therapeutic intervention in order to improve the following deficits and impairments:  Pain,Decreased activity tolerance,Decreased range of motion  Visit Diagnosis: Chronic right-sided low back pain without sciatica - Plan: PT plan of care cert/re-cert     Problem List Patient Active Problem List   Diagnosis Date Noted  . Acute on chronic diastolic (congestive) heart failure ([place]) 05/06/2020  . Acute on chronic diastolic congestive heart failure ([place]) 05/05/2020  . Hypomagnesemia 05/05/2020  . CKD (chronic kidney disease), stage III ([place])  05/05/2020  . Persistent atrial fibrillation ([place]) 02/20/2020  . Secondary hypercoagulable state ([place]) 02/20/2020  . Acute and chronic respiratory failure (acute-on-chronic) ([place]) 08/30/2019  . COPD exacerbation ([place]) 08/29/2019  . Chest pain 07/18/2018  . Sepsis due to urinary tract infection ([place]) 06/30/2018  . AKI (acute kidney injury) ([place]) 06/30/2018  . Sepsis, unspecified organism ([place]) 06/30/2018  . Normocytic anemia 06/30/2018  . Coronary artery disease due to lipid rich plaque   . Unstable angina ([place]) 03/23/2018  . Acute respiratory distress 09/26/2017  . Acute exacerbation of chronic obstructive pulmonary disease (COPD) ([place]) 09/26/2017  . Dependence on nocturnal oxygen therapy  09/26/2017  . Type 2 diabetes mellitus ([place]) 09/26/2017  . Acute on chronic diastolic CHF (congestive heart failure) ([place]) 09/26/2017  . Cardiac pacemaker 11/17/2016  . Eczema 11/17/2016  . Major depression, recurrent, chronic ([place]) 09/17/2016  . Axillary abscess 07/22/2016  . Chronic left shoulder pain 10/01/2015  . Xerosis cutis 10/01/2015  . Paroxysmal atrial fibrillation ([place]) 05/24/2014  . PAF (paroxysmal atrial fibrillation) ([place]) 05/24/2014  . NAFLD (nonalcoholic fatty liver disease) 03/29/2014  . Renal cyst 03/29/2014  . Elevated TSH 03/06/2014  . Type 2 diabetes mellitus without complication, without long-term current use of insulin ([place]) 03/06/2014  . Peripheral neuropathy 04/27/2013  . Vitamin B12 deficiency 04/27/2013  . Abnormality of gait 03/29/2013  . Diabetic neuropathy ([place]) 03/29/2013  . OSA (obstructive sleep apnea) 08/26/2012  . Chronic asthmatic bronchitis ([place]) 08/26/2012  . Preop cardiovascular exam 08/15/2012  . Nocturnal hypoxia 04/08/2012  . Seborrheic dermatitis 10/21/2011  . IBS (irritable bowel syndrome) 05/27/2011  . Esophageal reflux 03/27/2011  . Mixed hyperlipidemia 03/27/2011  . Chronic obstructive pulmonary disease ([place]) 03/27/2011  . Mobitz type 2 second  degree atrioventricular block S/p Pacemaker placement 02/09/2011  . Essential hypertension 02/09/2011  . Obesity 02/09/2011  . Tobacco use disorder 02/05/2011  . Vitamin D deficiency 12/30/2010  . Osteoarthrosis involving lower leg 12/26/2010  . Allergic rhinitis 01/23/2009  . Mixed incontinence 10/08/2008    Remiel Corti, [place] MPT 03/25/2021, 2:24 PM  [facility] [address] [place], [place], [address] Phone: [phone]   Fax:  [phone]  Name: KLOHE LOVERING MRN: [id number] Date of Birth: Apr 30, 1941

## 2021-03-26 NOTE — Progress Notes (Signed)
Remote pacemaker transmission.   

## 2021-03-27 ENCOUNTER — Ambulatory Visit: Payer: Medicare HMO | Admitting: *Deleted

## 2021-03-27 ENCOUNTER — Other Ambulatory Visit: Payer: Self-pay

## 2021-03-27 DIAGNOSIS — G8929 Other chronic pain: Secondary | ICD-10-CM

## 2021-03-27 DIAGNOSIS — M542 Cervicalgia: Secondary | ICD-10-CM | POA: Diagnosis not present

## 2021-03-27 NOTE — Therapy (Signed)
[facility] [place], [place], [address] Phone: [phone]   Fax:  [phone]  Physical Therapy Treatment  Patient Details  Name: Brandi Hunter MRN: [id number] Date of Birth: 07-28-1941 Referring Provider (PT): Dorene Ar MD   Encounter Date: 03/27/2021   PT End of Session - 03/27/21 1020    Visit Number 9    Number of Visits 12    Date for PT Re-Evaluation 04/09/21    Authorization Type FOTO AT LEAST EVERY 5TH VISIT.  PROGRESS NOTE AT 10TH VISIT.  KX MODIFIER AFTER 15 VISITS.    PT Start Time 1030    PT Stop Time 1116    PT Time Calculation (min) 46 min           Past Medical History:  Diagnosis Date  . Asthma   . CAD (coronary artery disease)    a. catheterization in 03/2018 showing 90% ostial OM stenosis with medical management recommended at that time  . Chronic anticoagulation   . COPD (chronic obstructive pulmonary disease) ([place])   . DJD (degenerative joint disease)   . Essential hypertension   . GERD (gastroesophageal reflux disease)   . History of home oxygen therapy   . Hyperlipidemia   . Mobitz (type) II atrioventricular block   . Obesity   . Pacemaker    Implanted by Dr [name] (MDT) 10/06/10  . PAF (paroxysmal atrial fibrillation) ([place])   . Pancreatitis 2010 OR 2011  . Sleep apnea    CPAP  . Type 2 diabetes mellitus ([place])     Past Surgical History:  Procedure Laterality Date  . CARDIOVERSION N/A 08/18/2019   Procedure: CARDIOVERSION;  Surgeon: Lelon Perla, MD;  Location: [facility] ENDOSCOPY;  Service: Cardiovascular;  Laterality: N/A;  . CATARACT EXTRACTION W/PHACO  11/02/2011   Procedure: CATARACT EXTRACTION PHACO AND INTRAOCULAR LENS PLACEMENT ([place]);  Surgeon: Williams Che;  Location: [facility] ORS;  Service: Ophthalmology;  Laterality: Right;  CDE=7.33  . CATARACT EXTRACTION W/PHACO  12/07/2011   Procedure: CATARACT EXTRACTION PHACO AND INTRAOCULAR LENS PLACEMENT (IOC);  Surgeon: Williams Che, MD;  Location: [facility] ORS;  Service: Ophthalmology;  Laterality: Left;  CDE 3.61  . COLONOSCOPY WITH PROPOFOL N/A 08/09/2014   Procedure: COLONOSCOPY WITH PROPOFOL;  Surgeon: Juanita Craver, MD;  Location[facility] ENDOSCOPY;  Service: Endoscopy;  Laterality: N/A;  . CYSTOCELE REPAIR    . ESOPHAGOGASTRODUODENOSCOPY (EGD) WITH PROPOFOL N/A 08/09/2014   Procedure: ESOPHAGOGASTRODUODENOSCOPY (EGD) WITH PROPOFOL;  Surgeon: Juanita Craver, MD;  Location[facility] ENDOSCOPY;  Service: Endoscopy;  Laterality: N/A;  . EYE SURGERY  11/01/2012   BOTH EYES CATARACTS  . INSERT / REPLACE / REMOVE PACEMAKER  10/06/10   MDT  implanted by Dr [name]  . KNEE ARTHROSCOPY     both  . LEFT HEART CATH AND CORONARY ANGIOGRAPHY N/A 03/24/2018   Procedure: LEFT HEART CATH AND CORONARY ANGIOGRAPHY;  Surgeon: Lorretta Harp, MD;  Location: [place] CV LAB;  Service: Cardiovascular;  Laterality: N/A;  . OVARY SURGERY     removal  . REPAIR RECTOCELE    . SIMPLE MASTECTOMY WITH AXILLARY SENTINEL NODE BIOPSY Left 01/09/2015   Procedure: Irrigation and Drainage Abcess left Axilla;  Surgeon: Jackolyn Confer, MD;  Location: [facility] ORS;  Service: General;  Laterality: Left;  . TONSILLECTOMY  AGE 5  . TOTAL ABDOMINAL HYSTERECTOMY  1971    There were no vitals filed for this visit.   Subjective Assessment - 03/27/21 1018    Subjective  LBP is worse after standing a while, Sleep good.    Pertinent History Bilateral knee ATS, h/o LBP, PACEMAKER, latex and bee sting allergies.    Patient Stated Goals Decreased pain with ADL's    Currently in Pain? Yes    Pain Score 5     Pain Location Back    Pain Orientation Right    Pain Descriptors / Indicators Aching;Sore    Pain Type Chronic pain    Pain Onset 1 to 4 weeks ago                             [facility] Adult PT Treatment/Exercise - 03/27/21 0001      Self-Care   Self-Care ADL's    ADL's Discussed ADL's and her posture during these act.'s as well as how long can she  be up before her LBP starts.      Exercises   Exercises Lumbar;Knee/Hip      Lumbar Exercises: Standing   Other Standing Lumbar Exercises AB bracing with modified bird dog arm raise x 5 each UE with 5 sec hold      Lumbar Exercises: Supine   Ab Set 10 reps;3 seconds    Bent Knee Raise 20 reps;5 seconds   with AB bracing   Bridge 10 reps;5 seconds   with AB bracing     Lumbar Exercises: Quadruped   [place] 10 reps   verbal and tactile cuing   Single Arm Raise 10 reps;5 seconds   vc's and tactile for neutral spine                      PT Long Term Goals - 03/25/21 1421      PT LONG TERM GOAL #5   Title Decrease right LBP-level to no higher than a 3/10 when performing ADL's.    Time 6    Period Weeks    Status New                 Plan - 03/27/21 1310    Clinical Impression Statement Pt arrived today doing fairly well with low pain levels. Rx focused on posture for neutral spine and discussed ADLs that trigger her pain. She reports the longer she is up her pain increases. We discussed taking breaks before her pain starts and start managing her standing time. Rx also included AB bracing and additional core exs.QP leg raise was very challenging, so just the arm raise was performed.    Personal Factors and Comorbidities Comorbidity 1;Other;Comorbidity 2    Comorbidities Bilateral knee ATS, h/o LBP, PACEMAKER, latex and bee sting allergies.    Stability/Clinical Decision Making Stable/Uncomplicated    Rehab Potential Good    PT Frequency 2x / week    PT Duration 6 weeks    PT Treatment/Interventions ADLs/Self Care Home Management;Traction;Moist Heat;Therapeutic activities;Therapeutic exercise;Manual techniques;Patient/family education;Passive range of motion           Patient will benefit from skilled therapeutic intervention in order to improve the following deficits and impairments:  Pain,Decreased activity tolerance,Decreased range of motion  Visit  Diagnosis: Chronic right-sided low back pain without sciatica     Problem List Patient Active Problem List   Diagnosis Date Noted  . Acute on chronic diastolic (congestive) heart failure ([place]) 05/06/2020  . Acute on chronic diastolic congestive heart failure ([place]) 05/05/2020  . Hypomagnesemia 05/05/2020  . CKD (chronic kidney disease), stage III ([place]) 05/05/2020  .  Persistent atrial fibrillation ([place]) 02/20/2020  . Secondary hypercoagulable state ([place]) 02/20/2020  . Acute and chronic respiratory failure (acute-on-chronic) ([place]) 08/30/2019  . COPD exacerbation ([place]) 08/29/2019  . Chest pain 07/18/2018  . Sepsis due to urinary tract infection ([place]) 06/30/2018  . AKI (acute kidney injury) ([place]) 06/30/2018  . Sepsis, unspecified organism ([place]) 06/30/2018  . Normocytic anemia 06/30/2018  . Coronary artery disease due to lipid rich plaque   . Unstable angina ([place]) 03/23/2018  . Acute respiratory distress 09/26/2017  . Acute exacerbation of chronic obstructive pulmonary disease (COPD) ([place]) 09/26/2017  . 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 Type 2 diabetes mellitus ([place]) 09/26/2017  . Acute on chronic diastolic CHF (congestive heart failure) ([place]) 09/26/2017  . Cardiac pacemaker 11/17/2016  . Eczema 11/17/2016  . Major depression, recurrent, chronic ([place]) 09/17/2016  . Axillary abscess 07/22/2016  . Chronic left shoulder pain 10/01/2015  . Xerosis cutis 10/01/2015  . Paroxysmal atrial fibrillation ([place]) 05/24/2014  . PAF (paroxysmal atrial fibrillation) ([place]) 05/24/2014  . NAFLD (nonalcoholic fatty liver disease) 03/29/2014  . Renal cyst 03/29/2014  . Elevated TSH 03/06/2014  . Type 2 diabetes mellitus without complication, without long-term current use of insulin ([place]) 03/06/2014  . Peripheral neuropathy 04/27/2013  . Vitamin B12 deficiency 04/27/2013  . Abnormality of gait 03/29/2013  . Diabetic neuropathy ([place]) 03/29/2013  . OSA (obstructive sleep apnea)  08/26/2012  . Chronic asthmatic bronchitis ([place]) 08/26/2012  . Preop cardiovascular exam 08/15/2012  . Nocturnal hypoxia 04/08/2012  . Seborrheic dermatitis 10/21/2011  . IBS (irritable bowel syndrome) 05/27/2011  . Esophageal reflux 03/27/2011  . Mixed hyperlipidemia 03/27/2011  . Chronic obstructive pulmonary disease ([place]) 03/27/2011  . 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 Essential hypertension 02/09/2011  . Obesity 02/09/2011  . Tobacco use disorder 02/05/2011  . Vitamin D deficiency 12/30/2010  . Osteoarthrosis involving lower leg 12/26/2010  . Allergic rhinitis 01/23/2009  . Mixed incontinence 10/08/2008    Shardai Star,CHRIS, PTA 03/27/2021, 1:29 PM  [facility] [place], [place], [address] Phone: [phone]   Fax:  [phone]  Name: Brandi Hunter MRN: [id number] Date of Birth: 10/31/1941

## 2021-04-01 ENCOUNTER — Other Ambulatory Visit: Payer: Self-pay

## 2021-04-01 ENCOUNTER — Ambulatory Visit: Payer: Medicare HMO | Admitting: Physical Therapy

## 2021-04-01 ENCOUNTER — Encounter: Payer: Self-pay | Admitting: Physical Therapy

## 2021-04-01 DIAGNOSIS — M542 Cervicalgia: Secondary | ICD-10-CM | POA: Diagnosis not present

## 2021-04-01 DIAGNOSIS — G8929 Other chronic pain: Secondary | ICD-10-CM

## 2021-04-01 NOTE — Therapy (Signed)
[facility] [place], [place], [address] Phone: [phone]   Fax:  [phone]  Physical Therapy Treatment  Patient Details  Name: Brandi Hunter MRN: [id number] Date of Birth: Aug 21, 1941 Referring Provider (PT): Dorene Ar MD   Encounter Date: 04/01/2021   PT End of Session - 04/01/21 1151    Visit Number 10    Number of Visits 12    Date for PT Re-Evaluation 04/09/21    Authorization Type FOTO AT LEAST EVERY 5TH VISIT.  PROGRESS NOTE AT 10TH VISIT.  KX MODIFIER AFTER 15 VISITS.    PT Start Time 1035    PT Stop Time 1117    PT Time Calculation (min) 42 min    Activity Tolerance Patient tolerated treatment well    Behavior During Therapy WFL for tasks assessed/performed           Past Medical History:  Diagnosis Date  . Asthma   . CAD (coronary artery disease)    a. catheterization in 03/2018 showing 90% ostial OM stenosis with medical management recommended at that time  . Chronic anticoagulation   . COPD (chronic obstructive pulmonary disease) ([place])   . DJD (degenerative joint disease)   . Essential hypertension   . GERD (gastroesophageal reflux disease)   . History of home oxygen therapy   . Hyperlipidemia   . Mobitz (type) II atrioventricular block   . Obesity   . Pacemaker    Implanted by Dr [name] (MDT) 10/06/10  . PAF (paroxysmal atrial fibrillation) ([place])   . Pancreatitis 2010 OR 2011  . Sleep apnea    CPAP  . Type 2 diabetes mellitus ([place])     Past Surgical History:  Procedure Laterality Date  . CARDIOVERSION N/A 08/18/2019   Procedure: CARDIOVERSION;  Surgeon: Lelon Perla, MD;  Location: [facility] ENDOSCOPY;  Service: Cardiovascular;  Laterality: N/A;  . CATARACT EXTRACTION W/PHACO  11/02/2011   Procedure: CATARACT EXTRACTION PHACO AND INTRAOCULAR LENS PLACEMENT ([place]);  Surgeon: Williams Che;  Location: [facility];  Service: Ophthalmology;  Laterality: Right;  CDE=7.33  . CATARACT EXTRACTION  W/PHACO  12/07/2011   Procedure: CATARACT EXTRACTION PHACO AND INTRAOCULAR LENS PLACEMENT (IOC);  Surgeon: Williams Che, MD;  Location: [facility];  Service: Ophthalmology;  Laterality: Left;  CDE 3.61  . COLONOSCOPY WITH PROPOFOL N/A 08/09/2014   Procedure: COLONOSCOPY WITH PROPOFOL;  Surgeon: Juanita Craver, MD;  Location: [facility];  Service: Endoscopy;  Laterality: N/A;  . CYSTOCELE REPAIR    . ESOPHAGOGASTRODUODENOSCOPY (EGD) WITH PROPOFOL N/A 08/09/2014   Procedure: ESOPHAGOGASTRODUODENOSCOPY (EGD) WITH PROPOFOL;  Surgeon: Juanita Craver, MD;  Location: [facility];  Service: Endoscopy;  Laterality: N/A;  . EYE SURGERY  11/01/2012   BOTH EYES CATARACTS  . INSERT / REPLACE / REMOVE PACEMAKER  10/06/10   MDT  implanted by Dr [name]  . KNEE ARTHROSCOPY     both  . LEFT HEART CATH AND CORONARY ANGIOGRAPHY N/A 03/24/2018   Procedure: LEFT HEART CATH AND CORONARY ANGIOGRAPHY;  Surgeon: Lorretta Harp, MD;  Location: [facility];  Service: Cardiovascular;  Laterality: N/A;  . OVARY SURGERY     removal  . REPAIR RECTOCELE    . SIMPLE MASTECTOMY WITH AXILLARY SENTINEL NODE BIOPSY Left 01/09/2015   Procedure: Irrigation and Drainage Abcess left Axilla;  Surgeon: Jackolyn Confer, MD;  Location: [facility];  Service: General;  Laterality: Left;  . TONSILLECTOMY  AGE 25  . TOTAL ABDOMINAL HYSTERECTOMY  1971  There were no vitals filed for this visit.   Subjective Assessment - 04/01/21 1033    Subjective "I hurt all over."    Pertinent History Bilateral knee ATS, h/o LBP, PACEMAKER, latex and bee sting allergies.    Patient Stated Goals Decreased pain with ADL's    Currently in Pain? Yes    Pain Score 7     Pain Location Back    Pain Orientation Right    Pain Descriptors / Indicators Sore    Pain Type Chronic pain    Pain Onset 1 to 4 weeks ago    Pain Frequency Constant              [facility] PT Assessment - 04/01/21 0001      Assessment   Medical Diagnosis LBP    Referring  Provider (PT) Dorene Ar MD    Next MD Visit 05/22/2021      Precautions   Precautions Fall    Precaution Comments PACEMAKER.  Rollator usage and portable 02 at 2L/min.      Restrictions   Weight Bearing Restrictions No                         [facility] Adult PT Treatment/Exercise - 04/01/21 0001      Lumbar Exercises: Seated   Long Arc Quad on Chair AROM;Both;2 sets;10 reps    Hip Flexion on Ball AROM;Both;20 reps    Other Seated Lumbar Exercises Row red theraband x20 reps    Other Seated Lumbar Exercises clam red theraband x30 reps      Manual Therapy   Manual Therapy Soft tissue mobilization    Soft tissue mobilization STW in sitting to B lumbar paraspinals, QL to reduce tone and pain                       PT Long Term Goals - 03/25/21 1421      PT LONG TERM GOAL #5   Title Decrease right LBP-level to no higher than a 3/10 when performing ADL's.    Time 6    Period Weeks    Status New                 Plan - 04/01/21 1152    Clinical Impression Statement Patient presented in clinic with reports of increased generalized pain after a busy weekend as well as difficulty breathing. Patient able to tolerate light therex in sitting with ab bracing instruction. Patient reported limited to no HEP follow through due to pain. Patient presented with increased B lumbar tone especially in lumbar paraspinals and QL region R> L. Patient reported less pain and tightness upon end of session.    Personal Factors and Comorbidities Comorbidity 1;Other;Comorbidity 2    Comorbidities Bilateral knee ATS, h/o LBP, PACEMAKER, latex and bee sting allergies.    Examination-Activity Limitations Other    Examination-Participation Restrictions Other    Stability/Clinical Decision Making Stable/Uncomplicated    Rehab Potential Good    PT Frequency 2x / week    PT Duration 6 weeks    PT Treatment/Interventions ADLs/Self Care Home Management;Traction;Moist  Heat;Therapeutic activities;Therapeutic exercise;Manual techniques;Patient/family education;Passive range of motion    PT Next Visit Plan Monitor LBP symptoms and tolerance.    Consulted and Agree with Plan of Care Patient           Patient will benefit from skilled therapeutic intervention in order to improve the following deficits and impairments:  Pain,Decreased activity tolerance,Decreased range of motion  Visit Diagnosis: Chronic right-sided low back pain without sciatica     Problem List Patient Active Problem List   Diagnosis Date Noted  . Acute on chronic diastolic (congestive) heart failure ([place]) 05/06/2020  . Acute on chronic diastolic congestive heart failure ([place]) 05/05/2020  . Hypomagnesemia 05/05/2020  . CKD (chronic kidney disease), stage III ([place]) 05/05/2020  . Persistent atrial fibrillation ([place]) 02/20/2020  . Secondary hypercoagulable state ([place]) 02/20/2020  . Acute and chronic respiratory failure (acute-on-chronic) ([place]) 08/30/2019  . COPD exacerbation ([place]) 08/29/2019  . Chest pain 07/18/2018  . Sepsis due to urinary tract infection ([place]) 06/30/2018  . AKI (acute kidney injury) ([place]) 06/30/2018  . Sepsis, unspecified organism ([place]) 06/30/2018  . Normocytic anemia 06/30/2018  . Coronary artery disease due to lipid rich plaque   . Unstable angina ([place]) 03/23/2018  . Acute respiratory distress 09/26/2017  . Acute exacerbation of chronic obstructive pulmonary disease (COPD) ([place]) 09/26/2017  . 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 Type 2 diabetes mellitus ([place]) 09/26/2017  . Acute on chronic diastolic CHF (congestive heart failure) ([place]) 09/26/2017  . Cardiac pacemaker 11/17/2016  . Eczema 11/17/2016  . Major depression, recurrent, chronic ([place]) 09/17/2016  . Axillary abscess 07/22/2016  . Chronic left shoulder pain 10/01/2015  . Xerosis cutis 10/01/2015  . Paroxysmal atrial fibrillation ([place]) 05/24/2014  . PAF (paroxysmal atrial  fibrillation) ([place]) 05/24/2014  . NAFLD (nonalcoholic fatty liver disease) 03/29/2014  . Renal cyst 03/29/2014  . Elevated TSH 03/06/2014  . Type 2 diabetes mellitus without complication, without long-term current use of insulin ([place]) 03/06/2014  . Peripheral neuropathy 04/27/2013  . Vitamin B12 deficiency 04/27/2013  . Abnormality of gait 03/29/2013  . Diabetic neuropathy ([place]) 03/29/2013  . OSA (obstructive sleep apnea) 08/26/2012  . Chronic asthmatic bronchitis ([place]) 08/26/2012  . Preop cardiovascular exam 08/15/2012  . Nocturnal hypoxia 04/08/2012  . Seborrheic dermatitis 10/21/2011  . IBS (irritable bowel syndrome) 05/27/2011  . Esophageal reflux 03/27/2011  . Mixed hyperlipidemia 03/27/2011  . Chronic obstructive pulmonary disease ([place]) 03/27/2011  . 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 Essential hypertension 02/09/2011  . Obesity 02/09/2011  . Tobacco use disorder 02/05/2011  . Vitamin D deficiency 12/30/2010  . Osteoarthrosis involving lower leg 12/26/2010  . Allergic rhinitis 01/23/2009  . Mixed incontinence 10/08/2008    Standley Brooking, PTA 04/01/2021, 11:56 AM  [facility] [address] [place], [place], [address] Phone: [phone]   Fax:  [phone]  Name: MAKAYLEE SPIELBERG MRN: [id number] Date of Birth: September 01, 1941

## 2021-04-03 ENCOUNTER — Ambulatory Visit: Payer: Medicare HMO | Admitting: Physical Therapy

## 2021-04-03 ENCOUNTER — Other Ambulatory Visit: Payer: Self-pay

## 2021-04-03 DIAGNOSIS — M542 Cervicalgia: Secondary | ICD-10-CM | POA: Diagnosis not present

## 2021-04-03 DIAGNOSIS — G8929 Other chronic pain: Secondary | ICD-10-CM

## 2021-04-03 DIAGNOSIS — M545 Low back pain, unspecified: Secondary | ICD-10-CM

## 2021-04-03 DIAGNOSIS — R293 Abnormal posture: Secondary | ICD-10-CM

## 2021-04-03 NOTE — Therapy (Signed)
[facility] [place], [place], [address] Phone: [phone]   Fax:  [phone]  Physical Therapy Treatment  Patient Details  Name: Brandi Hunter MRN: [id number] Date of Birth: [birth year] Referring Provider (PT): Dorene Ar MD   Encounter Date: 04/03/2021   PT End of Session - 04/03/21 1157    Visit Number 11    Number of Visits 12    Date for PT Re-Evaluation 04/09/21    Authorization Type FOTO AT LEAST EVERY 5TH VISIT.  PROGRESS NOTE AT 10TH VISIT.  KX MODIFIER AFTER 15 VISITS.    PT Start Time 1030    PT Stop Time 1110    PT Time Calculation (min) 40 min    Activity Tolerance Patient tolerated treatment well    Behavior During Therapy WFL for tasks assessed/performed           Past Medical History:  Diagnosis Date  . Asthma   . CAD (coronary artery disease)    a. catheterization in 03/2018 showing 90% ostial OM stenosis with medical management recommended at that time  . Chronic anticoagulation   . COPD (chronic obstructive pulmonary disease) ([place])   . DJD (degenerative joint disease)   . Essential hypertension   . GERD (gastroesophageal reflux disease)   . History of home oxygen therapy   . Hyperlipidemia   . Mobitz (type) II atrioventricular block   . Obesity   . Pacemaker    Implanted by Dr [name] (MDT) 10/06/10  . PAF (paroxysmal atrial fibrillation) ([place])   . Pancreatitis 2010 OR 2011  . Sleep apnea    CPAP  . Type 2 diabetes mellitus ([place])     Past Surgical History:  Procedure Laterality Date  . CARDIOVERSION N/A 08/18/2019   Procedure: CARDIOVERSION;  Surgeon: Lelon Perla, MD;  Location: [facility] ENDOSCOPY;  Service: Cardiovascular;  Laterality: N/A;  . CATARACT EXTRACTION W/PHACO  11/02/2011   Procedure: CATARACT EXTRACTION PHACO AND INTRAOCULAR LENS PLACEMENT ([place]);  Surgeon: Williams Che;  [facility];  Service: Ophthalmology;  Laterality: Right;  CDE=7.33  . CATARACT EXTRACTION  W/PHACO  12/07/2011   Procedure: CATARACT EXTRACTION PHACO AND INTRAOCULAR LENS PLACEMENT (I[facility]);  Surgeon: Williams Che, MD;  Location: [facility];  Service: Ophthalmology;  Laterality: Left;  CDE 3.61  . COLONOSCOPY WITH PROPOFOL N/A 08/09/2014   Procedure: COLONOSCOPY WITH PROPOFOL;  Surgeon: Juanita Craver, MD;  [facility];  Service: Endoscopy;  Laterality: N/A;  . CYSTOCELE REPAIR    . ESOPHAGOGASTRODUODENOSCOPY (EGD) WITH PROPOFOL N/A 08/09/2014   Procedure: ESOPHAGOGASTRODUODENOSCOPY (EGD) WITH PROPOFOL;  Surgeon: Juanita Craver, MD;  [facility];  Service: Endoscopy;  Laterality: N/A;  . EYE SURGERY  11/01/2012   BOTH EYES CATARACTS  . INSERT / REPLACE / REMOVE PACEMAKER  10/06/10   MDT  implanted by Dr [name]  . KNEE ARTHROSCOPY     both  . LEFT HEART CATH AND CORONARY ANGIOGRAPHY N/A 03/24/2018   Procedure: LEFT HEART CATH AND CORONARY ANGIOGRAPHY;  Surgeon: Lorretta Harp, MD;  Location: [facility];  Service: Cardiovascular;  Laterality: N/A;  . OVARY SURGERY     removal  . REPAIR RECTOCELE    . SIMPLE MASTECTOMY WITH AXILLARY SENTINEL NODE BIOPSY Left 01/09/2015   Procedure: Irrigation and Drainage Abcess left Axilla;  Surgeon: Jackolyn Confer, MD;  [facility];  Service: General;  Laterality: Left;  . TONSILLECTOMY  AGE 43  . TOTAL ABDOMINAL HYSTERECTOMY  1971  There were no vitals filed for this visit.   Subjective Assessment - 04/03/21 1158    Subjective COVID-19 screen performed prior to patient entering clinic.  Neck injection made me anxious.    Pertinent History Bilateral knee ATS, h/o LBP, PACEMAKER, latex and bee sting allergies.    Patient Stated Goals Decreased pain with ADL's    Currently in Pain? Yes    Pain Location Back    Pain Orientation Right    Pain Descriptors / Indicators Sore    Pain Type Chronic pain    Pain Frequency Constant                             [facility] Adult PT Treatment/Exercise - 04/03/21  0001      Moist Heat Therapy   Number Minutes Moist Heat 10 Minutes    Moist Heat Location Lumbar Spine      Manual Therapy   Manual Therapy Soft tissue mobilization    Soft tissue mobilization Left sdly position with folded pillow between knee;  STW/M x 23 minutes to affected right low back/SIJ to reduce tone and pain.                       PT Long Term Goals - 03/25/21 1421      PT LONG TERM GOAL #5   Title Decrease right LBP-level to no higher than a 3/10 when performing ADL's.    Time 6    Period Weeks    Status New                 Plan - 04/03/21 1159    Clinical Impression Statement Patient did well with treatment today.  Her right SIJ remains tender.  She felt better after STW/M.    Personal Factors and Comorbidities Comorbidity 1;Other;Comorbidity 2    Examination-Activity Limitations Other    Examination-Participation Restrictions Other    Stability/Clinical Decision Making Stable/Uncomplicated    Rehab Potential Good    PT Frequency 2x / week    PT Treatment/Interventions ADLs/Self Care Home Management;Traction;Moist Heat;Therapeutic activities;Therapeutic exercise;Manual techniques;Patient/family education;Passive range of motion    PT Next Visit Plan Monitor LBP symptoms and tolerance.    Consulted and Agree with Plan of Care Patient           Patient will benefit from skilled therapeutic intervention in order to improve the following deficits and impairments:  Pain,Decreased activity tolerance,Decreased range of motion  Visit Diagnosis: Chronic right-sided low back pain without sciatica  Cervicalgia  Abnormal posture     Problem List Patient Active Problem List   Diagnosis Date Noted  . Acute on chronic diastolic (congestive) heart failure ([place]) 05/06/2020  . Acute on chronic diastolic congestive heart failure ([place]) 05/05/2020  . Hypomagnesemia 05/05/2020  . CKD (chronic kidney disease), stage III ([place]) 05/05/2020  . Persistent  atrial fibrillation ([place]) 02/20/2020  . Secondary hypercoagulable state ([place]) 02/20/2020  . Acute and chronic respiratory failure (acute-on-chronic) ([place]) 08/30/2019  . COPD exacerbation ([place]) 08/29/2019  . Chest pain 07/18/2018  . Sepsis due to urinary tract infection ([place]) 06/30/2018  . AKI (acute kidney injury) ([place]) 06/30/2018  . Sepsis, unspecified organism ([place]) 06/30/2018  . Normocytic anemia 06/30/2018  . Coronary artery disease due to lipid rich plaque   . Unstable angina ([place]) 03/23/2018  . Acute respiratory distress 09/26/2017  . Acute exacerbation of chronic obstructive pulmonary disease (COPD) ([place]) 09/26/2017  .  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 Type 2 diabetes mellitus ([place]) 09/26/2017  . Acute on chronic diastolic CHF (congestive heart failure) ([place]) 09/26/2017  . Cardiac pacemaker 11/17/2016  . Eczema 11/17/2016  . Major depression, recurrent, chronic ([place]) 09/17/2016  . Axillary abscess 07/22/2016  . Chronic left shoulder pain 10/01/2015  . Xerosis cutis 10/01/2015  . Paroxysmal atrial fibrillation ([place]) 05/24/2014  . PAF (paroxysmal atrial fibrillation) ([place]) 05/24/2014  . NAFLD (nonalcoholic fatty liver disease) 03/29/2014  . Renal cyst 03/29/2014  . Elevated TSH 03/06/2014  . Type 2 diabetes mellitus without complication, without long-term current use of insulin ([place]) 03/06/2014  . Peripheral neuropathy 04/27/2013  . Vitamin B12 deficiency 04/27/2013  . Abnormality of gait 03/29/2013  . Diabetic neuropathy ([place]) 03/29/2013  . OSA (obstructive sleep apnea) 08/26/2012  . Chronic asthmatic bronchitis ([place]) 08/26/2012  . Preop cardiovascular exam 08/15/2012  . Nocturnal hypoxia 04/08/2012  . Seborrheic dermatitis 10/21/2011  . IBS (irritable bowel syndrome) 05/27/2011  . Esophageal reflux 03/27/2011  . Mixed hyperlipidemia 03/27/2011  . Chronic obstructive pulmonary disease ([place]) 03/27/2011  . Mobitz type 2 second degree atrioventricular  block S/p Pacemaker placement 02/09/2011  . Essential hypertension 02/09/2011  . Obesity 02/09/2011  . Tobacco use disorder 02/05/2011  . Vitamin D deficiency 12/30/2010  . Osteoarthrosis involving lower leg 12/26/2010  . Allergic rhinitis 01/23/2009  . Mixed incontinence 10/08/2008    Camilia Caywood, [place] MPT 04/03/2021, 12:02 PM  [facility] [address] [place], [place], [address] Phone: [phone]   Fax:  [phone]  Name: Brandi Hunter MRN: [id number] Date of Birth: September 08, 1941

## 2021-04-07 ENCOUNTER — Other Ambulatory Visit: Payer: Self-pay | Admitting: Cardiology

## 2021-04-07 ENCOUNTER — Encounter: Payer: Self-pay | Admitting: Physical Therapy

## 2021-04-07 ENCOUNTER — Other Ambulatory Visit: Payer: Self-pay

## 2021-04-07 ENCOUNTER — Ambulatory Visit: Payer: Medicare HMO | Admitting: Physical Therapy

## 2021-04-07 DIAGNOSIS — G8929 Other chronic pain: Secondary | ICD-10-CM

## 2021-04-07 DIAGNOSIS — M545 Low back pain, unspecified: Secondary | ICD-10-CM

## 2021-04-07 DIAGNOSIS — M542 Cervicalgia: Secondary | ICD-10-CM | POA: Diagnosis not present

## 2021-04-07 NOTE — Addendum Note (Signed)
Addended by: Kamari Buch, [place] W on: 04/07/2021 12:57 PM   Modules accepted: Orders
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
## 2021-04-10 ENCOUNTER — Ambulatory Visit: Payer: Medicare HMO | Admitting: Physical Therapy

## 2021-04-10 ENCOUNTER — Other Ambulatory Visit: Payer: Self-pay

## 2021-04-10 DIAGNOSIS — M545 Low back pain, unspecified: Secondary | ICD-10-CM

## 2021-04-10 DIAGNOSIS — M542 Cervicalgia: Secondary | ICD-10-CM | POA: Diagnosis not present

## 2021-04-10 DIAGNOSIS — G8929 Other chronic pain: Secondary | ICD-10-CM

## 2021-04-10 NOTE — Therapy (Signed)
[facility] [place], [place], [address] Phone: [phone]   Fax:  [phone]  Physical Therapy Treatment  Patient Details  Name: Brandi Hunter MRN: [id number] Date of Birth: Feb 18, 1941 Referring Provider (PT): Dorene Ar MD   Encounter Date: 04/10/2021   PT End of Session - 04/10/21 1059    Visit Number 13    Number of Visits 24    Date for PT Re-Evaluation 05/07/21    Authorization Type FOTO AT LEAST EVERY 5TH VISIT.  PROGRESS NOTE AT 10TH VISIT.  KX MODIFIER AFTER 15 VISITS.    PT Start Time 1030    PT Stop Time 1114    PT Time Calculation (min) 44 min    Activity Tolerance Patient tolerated treatment well    Behavior During Therapy WFL for tasks assessed/performed           Past Medical History:  Diagnosis Date  . Asthma   . CAD (coronary artery disease)    a. catheterization in 03/2018 showing 90% ostial OM stenosis with medical management recommended at that time  . Chronic anticoagulation   . COPD (chronic obstructive pulmonary disease) ([place])   . DJD (degenerative joint disease)   . Essential hypertension   . GERD (gastroesophageal reflux disease)   . History of home oxygen therapy   . Hyperlipidemia   . Mobitz (type) II atrioventricular block   . Obesity   . Pacemaker    Implanted by Dr [name] (MDT) 10/06/10  . PAF (paroxysmal atrial fibrillation) ([place])   . Pancreatitis 2010 OR 2011  . Sleep apnea    CPAP  . Type 2 diabetes mellitus ([place])     Past Surgical History:  Procedure Laterality Date  . CARDIOVERSION N/A 08/18/2019   Procedure: CARDIOVERSION;  Surgeon: Lelon Perla, MD;  Location: [facility] ENDOSCOPY;  Service: Cardiovascular;  Laterality: N/A;  . CATARACT EXTRACTION W/PHACO  11/02/2011   Procedure: CATARACT EXTRACTION PHACO AND INTRAOCULAR LENS PLACEMENT ([place]);  Surgeon: Williams Che;  Location: [facility];  Service: Ophthalmology;  Laterality: Right;  CDE=7.33  . CATARACT EXTRACTION  W/PHACO  12/07/2011   Procedure: CATARACT EXTRACTION PHACO AND INTRAOCULAR LENS PLACEMENT (IOC);  Surgeon: Williams Che, MD;  Location: [facility];  Service: Ophthalmology;  Laterality: Left;  CDE 3.61  . COLONOSCOPY WITH PROPOFOL N/A 08/09/2014   Procedure: COLONOSCOPY WITH PROPOFOL;  Surgeon: Juanita Craver, MD;  [facility] ENDOS[facility];  Service: Endoscopy;  Laterality: N/A;  . CYSTOCELE REPAIR    . ESOPHAGOGASTRODUODENOSCOPY (EGD) WITH PROPOFOL N/A 08/09/2014   Procedure: ESOPHAGOGASTRODUODENOSCOPY (EGD) WITH PROPOFOL;  Surgeon: Juanita Craver, MD;  [facility] ENDOS[facility];  Service: Endoscopy;  Laterality: N/A;  . EYE SURGERY  11/01/2012   BOTH EYES CATARACTS  . INSERT / REPLACE / REMOVE PACEMAKER  10/06/10   MDT  implanted by Dr [name]  . KNEE ARTHROSCOPY     both  . LEFT HEART CATH AND CORONARY ANGIOGRAPHY N/A 03/24/2018   Procedure: LEFT HEART CATH AND CORONARY ANGIOGRAPHY;  Surgeon: Lorretta Harp, MD;  Location: [facility] CV LAB;  Service: Cardiovascular;  Laterality: N/A;  . OVARY SURGERY     removal  . REPAIR RECTOCELE    . SIMPLE MASTECTOMY WITH AXILLARY SENTINEL NODE BIOPSY Left 01/09/2015   Procedure: Irrigation and Drainage Abcess left Axilla;  Surgeon: Jackolyn Confer, MD;  Location: [facility] ORS;  Service: General;  Laterality: Left;  . TONSILLECTOMY  AGE 12  . TOTAL ABDOMINAL HYSTERECTOMY  1971  There were no vitals filed for this visit.   Subjective Assessment - 04/10/21 1035    Subjective COVID-19 screen performed prior to patient entering clinic. Patient arrived doing good just ongoing back pain reported.    Pertinent History Bilateral knee ATS, h/o LBP, PACEMAKER, latex and bee sting allergies.    Patient Stated Goals Decreased pain with ADL's    Currently in Pain? Yes    Pain Score 4     Pain Location Back    Pain Orientation Lower    Pain Descriptors / Indicators Discomfort    Pain Type Chronic pain    Pain Onset 1 to 4 weeks ago    Pain Frequency Constant     Aggravating Factors  certain movements/activity    Pain Relieving Factors at rest                             [facility] Adult PT Treatment/Exercise - 04/10/21 0001      Lumbar Exercises: Aerobic   Nustep L3-4 x29min UE/LE activity      Lumbar Exercises: Seated   Other Seated Lumbar Exercises glut set 5sec  x20    Other Seated Lumbar Exercises Core press with ball x20 reps, abdominal bracing with heel lifts, toe raise, marching and overhead reaching 2x10 each      Manual Therapy   Manual Therapy Soft tissue mobilization    Soft tissue mobilization manual STW to R and L lumbar paraspinals and QL to reduce tone and pain                       PT Long Term Goals - 04/10/21 1036      PT LONG TERM GOAL #1   Title --    Time --    Period --    Status --      PT LONG TERM GOAL #2   Title --    Time --    Period --    Status --      PT LONG TERM GOAL #3   Title --    Time --    Period --    Status --      PT LONG TERM GOAL #4   Title --    Time --    Period --    Status --      PT LONG TERM GOAL #5   Title Decrease right LBP-level to no higher than a 3/10 when performing ADL's.    Baseline 4/10 pain currently some increased pain with ADL's 04/10/21    Time 6    Period Weeks    Status On-going                 Plan - 04/10/21 1121    Clinical Impression Statement Patient tolerated treatment well today. Today focused on seated abdominal bracing progression to strengthen low back. Patient was educated on posture awareness techniques in all positions to avoid re-injury/increased pain. Today also discussed sleeping positioning, standing, bending and lifting. Patient had tightness and pain bil low back with more reported on right QL. Goal progressing due to pain limitations.    Personal Factors and Comorbidities Comorbidity 1;Other;Comorbidity 2    Comorbidities Bilateral knee ATS, h/o LBP, PACEMAKER, latex and bee sting allergies.     Examination-Activity Limitations Other    Examination-Participation Restrictions Other    Stability/Clinical Decision Making Stable/Uncomplicated    Rehab Potential Good  PT Frequency 2x / week    PT Duration 6 weeks    PT Treatment/Interventions ADLs/Self Care Home Management;Traction;Moist Heat;Therapeutic activities;Therapeutic exercise;Manual techniques;Patient/family education;Passive range of motion    PT Next Visit Plan Monitor LBP symptoms and tolerance.    Consulted and Agree with Plan of Care Patient           Patient will benefit from skilled therapeutic intervention in order to improve the following deficits and impairments:  Pain,Decreased activity tolerance,Decreased range of motion  Visit Diagnosis: Chronic right-sided low back pain without sciatica     Problem List Patient Active Problem List   Diagnosis Date Noted  . Acute on chronic diastolic (congestive) heart failure ([place]) 05/06/2020  . Acute on chronic diastolic congestive heart failure ([place]) 05/05/2020  . Hypomagnesemia 05/05/2020  . CKD (chronic kidney disease), stage III ([place]) 05/05/2020  . Persistent atrial fibrillation ([place]) 02/20/2020  . Secondary hypercoagulable state ([place]) 02/20/2020  . Acute and chronic respiratory failure (acute-on-chronic) ([place]) 08/30/2019  . COPD exacerbation ([place]) 08/29/2019  . Chest pain 07/18/2018  . Sepsis due to urinary tract infection ([place]) 06/30/2018  . AKI (acute kidney injury) ([place]) 06/30/2018  . Sepsis, unspecified organism ([place]) 06/30/2018  . Normocytic anemia 06/30/2018  . Coronary artery disease due to lipid rich plaque   . Unstable angina ([place]) 03/23/2018  . Acute respiratory distress 09/26/2017  . Acute exacerbation of chronic obstructive pulmonary disease (COPD) ([place]) 09/26/2017  . 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 Type 2 diabetes mellitus ([place]) 09/26/2017  . Acute on chronic diastolic CHF (congestive heart failure) ([place]) 09/26/2017  .  Cardiac pacemaker 11/17/2016  . Eczema 11/17/2016  . Major depression, recurrent, chronic ([place]) 09/17/2016  . Axillary abscess 07/22/2016  . Chronic left shoulder pain 10/01/2015  . Xerosis cutis 10/01/2015  . Paroxysmal atrial fibrillation ([place]) 05/24/2014  . PAF (paroxysmal atrial fibrillation) ([place]) 05/24/2014  . NAFLD (nonalcoholic fatty liver disease) 03/29/2014  . Renal cyst 03/29/2014  . Elevated TSH 03/06/2014  . Type 2 diabetes mellitus without complication, without long-term current use of insulin ([place]) 03/06/2014  . Peripheral neuropathy 04/27/2013  . Vitamin B12 deficiency 04/27/2013  . Abnormality of gait 03/29/2013  . Diabetic neuropathy ([place]) 03/29/2013  . OSA (obstructive sleep apnea) 08/26/2012  . Chronic asthmatic bronchitis ([place]) 08/26/2012  . Preop cardiovascular exam 08/15/2012  . Nocturnal hypoxia 04/08/2012  . Seborrheic dermatitis 10/21/2011  . IBS (irritable bowel syndrome) 05/27/2011  . Esophageal reflux 03/27/2011  . Mixed hyperlipidemia 03/27/2011  . Chronic obstructive pulmonary disease ([place]) 03/27/2011  . 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 Essential hypertension 02/09/2011  . Obesity 02/09/2011  . Tobacco use disorder 02/05/2011  . Vitamin D deficiency 12/30/2010  . Osteoarthrosis involving lower leg 12/26/2010  . Allergic rhinitis 01/23/2009  . Mixed incontinence 10/08/2008    Phillips Climes, PTA 04/10/2021, 11:27 AM  [facility] [place], [place], [address] Phone: [phone]   Fax:  [phone]  Name: MAEGEN WIGLE MRN: [id number] Date of Birth: [birth year]

## 2021-04-17 ENCOUNTER — Ambulatory Visit: Payer: Medicare HMO | Attending: Anesthesiology | Admitting: Physical Therapy

## 2021-04-17 ENCOUNTER — Other Ambulatory Visit: Payer: Self-pay

## 2021-04-17 DIAGNOSIS — R293 Abnormal posture: Secondary | ICD-10-CM | POA: Insufficient documentation

## 2021-04-17 DIAGNOSIS — G8929 Other chronic pain: Secondary | ICD-10-CM | POA: Insufficient documentation

## 2021-04-17 DIAGNOSIS — M542 Cervicalgia: Secondary | ICD-10-CM | POA: Diagnosis present

## 2021-04-17 DIAGNOSIS — M545 Low back pain, unspecified: Secondary | ICD-10-CM | POA: Insufficient documentation

## 2021-04-17 NOTE — Therapy (Signed)
[facility] [place], [place], [address] Phone: [phone]   Fax:  [phone]  Physical Therapy Treatment  Patient Details  Name: Brandi Hunter MRN: [id number] Date of Birth: 03/05/1941 Referring Provider (PT): Dorene Ar MD   Encounter Date: 04/17/2021   PT End of Session - 04/17/21 1125    Visit Number 14    Number of Visits 24    Date for PT Re-Evaluation 05/07/21    Authorization Type FOTO AT LEAST EVERY 5TH VISIT.  PROGRESS NOTE AT 10TH VISIT.  KX MODIFIER AFTER 15 VISITS.    PT Start Time 1119    PT Stop Time 1153    PT Time Calculation (min) 34 min    Activity Tolerance Treatment limited secondary to medical complications (Comment)    Behavior During Therapy [facility] for tasks assessed/performed           Past Medical History:  Diagnosis Date  . Asthma   . CAD (coronary artery disease)    a. catheterization in 03/2018 showing 90% ostial OM stenosis with medical management recommended at that time  . Chronic anticoagulation   . COPD (chronic obstructive pulmonary disease) ([place])   . DJD (degenerative joint disease)   . Essential hypertension   . GERD (gastroesophageal reflux disease)   . History of home oxygen therapy   . Hyperlipidemia   . Mobitz (type) II atrioventricular block   . Obesity   . Pacemaker    Implanted by Dr [name] (MDT) 10/06/10  . PAF (paroxysmal atrial fibrillation) ([place])   . Pancreatitis 2010 OR 2011  . Sleep apnea    CPAP  . Type 2 diabetes mellitus ([place])     Past Surgical History:  Procedure Laterality Date  . CARDIOVERSION N/A 08/18/2019   Procedure: CARDIOVERSION;  Surgeon: Lelon Perla, MD;  Location: [facility] ENDOSCOPY;  Service: Cardiovascular;  Laterality: N/A;  . CATARACT EXTRACTION W/PHACO  11/02/2011   Procedure: CATARACT EXTRACTION PHACO AND INTRAOCULAR LENS PLACEMENT ([place]);  Surgeon: Williams Che;  Location: [facility] ORS;  Service: Ophthalmology;  Laterality: Right;   CDE=7.33  . CATARACT EXTRACTION W/PHACO  12/07/2011   Procedure: CATARACT EXTRACTION PHACO AND INTRAOCULAR LENS PLACEMENT (IOC);  Surgeon: Williams Che, MD;  Location: [facility] ORS;  Service: Ophthalmology;  Laterality: Left;  CDE 3.61  . COLONOSCOPY WITH PROPOFOL N/A 08/09/2014   Procedure: COLONOSCOPY WITH PROPOFOL;  Surgeon: Juanita Craver, MD;  Location: [facility] ENDOSCOPY;  Service: Endoscopy;  Laterality: N/A;  . CYSTOCELE REPAIR    . ESOPHAGOGASTRODUODENOSCOPY (EGD) WITH PROPOFOL N/A 08/09/2014   Procedure: ESOPHAGOGASTRODUODENOSCOPY (EGD) WITH PROPOFOL;  Surgeon: Juanita Craver, MD;  Location: [facility] ENDOSCOPY;  Service: Endoscopy;  Laterality: N/A;  . EYE SURGERY  11/01/2012   BOTH EYES CATARACTS  . INSERT / REPLACE / REMOVE PACEMAKER  10/06/10   MDT  implanted by Dr [name]  . KNEE ARTHROSCOPY     both  . LEFT HEART CATH AND CORONARY ANGIOGRAPHY N/A 03/24/2018   Procedure: LEFT HEART CATH AND CORONARY ANGIOGRAPHY;  Surgeon: Lorretta Harp, MD;  Location: [facility] CV LAB;  Service: Cardiovascular;  Laterality: N/A;  . OVARY SURGERY     removal  . REPAIR RECTOCELE    . SIMPLE MASTECTOMY WITH AXILLARY SENTINEL NODE BIOPSY Left 01/09/2015   Procedure: Irrigation and Drainage Abcess left Axilla;  Surgeon: Jackolyn Confer, MD;  Location: [facility] ORS;  Service: General;  Laterality: Left;  . TONSILLECTOMY  AGE 18  . TOTAL ABDOMINAL HYSTERECTOMY  1971  There were no vitals filed for this visit.   Subjective Assessment - 04/17/21 1123    Subjective COVID-19 screen performed prior to patient entering clinic. Patient reported ongoing back pain and difficulty breathing    Pertinent History Bilateral knee ATS, h/o LBP, PACEMAKER, latex and bee sting allergies.    Patient Stated Goals Decreased pain with ADL's    Currently in Pain? Yes    Pain Score 4     Pain Location Back    Pain Orientation Lower    Pain Descriptors / Indicators Discomfort    Pain Type Chronic pain    Pain Onset 1 to 4 weeks ago     Pain Frequency Constant    Aggravating Factors  certain movements    Pain Relieving Factors rest                             [facility] Adult PT Treatment/Exercise - 04/17/21 0001      Lumbar Exercises: Aerobic   Nustep L3 x 10 min UE/LE activity, monitored for progression, slow pace and core focus      Manual Therapy   Manual Therapy Soft tissue mobilization    Soft tissue mobilization manual STW to R and L lumbar paraspinals and QL to reduce tone and pain                       PT Long Term Goals - 04/17/21 1126      PT LONG TERM GOAL #5   Title Decrease right LBP-level to no higher than a 3/10 when performing ADL's.    Baseline 4/10 pain currently some increased pain with ADL's 04/17/21    Time 6    Period Weeks    Status On-going                 Plan - 04/17/21 1154    Clinical Impression Statement Patient tolerated treatment fair due to breathing problems today. Patient limied with ther ex today. Today focused on STW to bil low back to reduce pain and tone. No current goals met due to pain limitations.    Personal Factors and Comorbidities Comorbidity 1;Other;Comorbidity 2    Comorbidities Bilateral knee ATS, h/o LBP, PACEMAKER, latex and bee sting allergies.    Examination-Activity Limitations Other    Examination-Participation Restrictions Other    Stability/Clinical Decision Making Stable/Uncomplicated    Rehab Potential Good    PT Frequency 2x / week    PT Duration 6 weeks    PT Treatment/Interventions ADLs/Self Care Home Management;Traction;Moist Heat;Therapeutic activities;Therapeutic exercise;Manual techniques;Patient/family education;Passive range of motion    PT Next Visit Plan Monitor LBP symptoms and tolerance.    Consulted and Agree with Plan of Care Patient           Patient will benefit from skilled therapeutic intervention in order to improve the following deficits and impairments:  Pain,Decreased activity  tolerance,Decreased range of motion  Visit Diagnosis: Chronic right-sided low back pain without sciatica     Problem List Patient Active Problem List   Diagnosis Date Noted  . Acute on chronic diastolic (congestive) heart failure ([place]) 05/06/2020  . Acute on chronic diastolic congestive heart failure ([place]) 05/05/2020  . Hypomagnesemia 05/05/2020  . CKD (chronic kidney disease), stage III ([place]) 05/05/2020  . Persistent atrial fibrillation ([place]) 02/20/2020  . Secondary hypercoagulable state ([place]) 02/20/2020  . Acute and chronic respiratory failure (acute-on-chronic) ([place]) 08/30/2019  . COPD  exacerbation ([place]) 08/29/2019  . Chest pain 07/18/2018  . Sepsis due to urinary tract infection ([place]) 06/30/2018  . AKI (acute kidney injury) ([place]) 06/30/2018  . Sepsis, unspecified organism ([place]) 06/30/2018  . Normocytic anemia 06/30/2018  . Coronary artery disease due to lipid rich plaque   . Unstable angina ([place]) 03/23/2018  . Acute respiratory distress 09/26/2017  . Acute exacerbation of chronic obstructive pulmonary disease (COPD) ([place]) 09/26/2017  . 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 Type 2 diabetes mellitus ([place]) 09/26/2017  . Acute on chronic diastolic CHF (congestive heart failure) ([place]) 09/26/2017  . Cardiac pacemaker 11/17/2016  . Eczema 11/17/2016  . Major depression, recurrent, chronic ([place]) 09/17/2016  . Axillary abscess 07/22/2016  . Chronic left shoulder pain 10/01/2015  . Xerosis cutis 10/01/2015  . Paroxysmal atrial fibrillation ([place]) 05/24/2014  . PAF (paroxysmal atrial fibrillation) ([place]) 05/24/2014  . NAFLD (nonalcoholic fatty liver disease) 03/29/2014  . Renal cyst 03/29/2014  . Elevated TSH 03/06/2014  . Type 2 diabetes mellitus without complication, without long-term current use of insulin ([place]) 03/06/2014  . Peripheral neuropathy 04/27/2013  . Vitamin B12 deficiency 04/27/2013  . Abnormality of gait 03/29/2013  . Diabetic neuropathy ([place])  03/29/2013  . OSA (obstructive sleep apnea) 08/26/2012  . Chronic asthmatic bronchitis ([place]) 08/26/2012  . Preop cardiovascular exam 08/15/2012  . Nocturnal hypoxia 04/08/2012  . Seborrheic dermatitis 10/21/2011  . IBS (irritable bowel syndrome) 05/27/2011  . Esophageal reflux 03/27/2011  . Mixed hyperlipidemia 03/27/2011  . Chronic obstructive pulmonary disease ([place]) 03/27/2011  . 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 Essential hypertension 02/09/2011  . Obesity 02/09/2011  . Tobacco use disorder 02/05/2011  . Vitamin D deficiency 12/30/2010  . Osteoarthrosis involving lower leg 12/26/2010  . Allergic rhinitis 01/23/2009  . Mixed incontinence 10/08/2008    Phillips Climes, PTA 04/17/2021, 11:56 AM  [facility] [place], [place], [address] Phone: [phone]   Fax:  [phone]  Name: ERIYANA SWEETEN MRN: [id number] Date of Birth: Nov 29, 1940

## 2021-04-21 ENCOUNTER — Other Ambulatory Visit: Payer: Self-pay

## 2021-04-21 ENCOUNTER — Ambulatory Visit: Payer: Medicare HMO | Admitting: Physical Therapy

## 2021-04-21 DIAGNOSIS — M545 Low back pain, unspecified: Secondary | ICD-10-CM | POA: Diagnosis not present

## 2021-04-21 DIAGNOSIS — G8929 Other chronic pain: Secondary | ICD-10-CM

## 2021-04-21 NOTE — Therapy (Signed)
[facility] [place], [place], [address] Phone: [phone]   Fax:  [phone]  Physical Therapy Treatment  Patient Details  Name: Brandi Hunter MRN: [id number] Date of Birth: 05-Nov-1941 Referring Provider (PT): Dorene Ar MD   Encounter Date: 04/21/2021   PT End of Session - 04/21/21 1032    Visit Number 15    Number of Visits 24    Date for PT Re-Evaluation 05/07/21    Authorization Type PROGRESS NOTE AT 10TH VISIT.  KX MODIFIER AFTER 15 VISITS.    PT Start Time 1030    PT Stop Time 1110    PT Time Calculation (min) 40 min    Activity Tolerance Patient tolerated treatment well    Behavior During Therapy [facility] for tasks assessed/performed           Past Medical History:  Diagnosis Date  . Asthma   . CAD (coronary artery disease)    a. catheterization in 03/2018 showing 90% ostial OM stenosis with medical management recommended at that time  . Chronic anticoagulation   . COPD (chronic obstructive pulmonary disease) ([place])   . DJD (degenerative joint disease)   . Essential hypertension   . GERD (gastroesophageal reflux disease)   . History of home oxygen therapy   . Hyperlipidemia   . Mobitz (type) II atrioventricular block   . Obesity   . Pacemaker    Implanted by Dr [name] (MDT) 10/06/10  . PAF (paroxysmal atrial fibrillation) ([place])   . Pancreatitis 2010 OR 2011  . Sleep apnea    CPAP  . Type 2 diabetes mellitus ([place])     Past Surgical History:  Procedure Laterality Date  . CARDIOVERSION N/A 08/18/2019   Procedure: CARDIOVERSION;  Surgeon: Lelon Perla, MD;  Location: [facility] ENDOSCOPY;  Service: Cardiovascular;  Laterality: N/A;  . CATARACT EXTRACTION W/PHACO  11/02/2011   Procedure: CATARACT EXTRACTION PHACO AND INTRAOCULAR LENS PLACEMENT ([place]);  Surgeon: Williams Che;  Location: [facility] ORS;  Service: Ophthalmology;  Laterality: Right;  CDE=7.33  . CATARACT EXTRACTION W/PHACO  12/07/2011   Procedure:  CATARACT EXTRACTION PHACO AND INTRAOCULAR LENS PLACEMENT (IOC);  Surgeon: Williams Che, MD;  Location: [facility] ORS;  Service: Ophthalmology;  Laterality: Left;  CDE 3.61  . COLONOSCOPY WITH PROPOFOL N/A 08/09/2014   Procedure: COLONOSCOPY WITH PROPOFOL;  Surgeon: Juanita Craver, MD;  [facility] ENDOSCOPY;  Service: Endoscopy;  Laterality: N/A;  . CYSTOCELE REPAIR    . ESOPHAGOGASTRODUODENOSCOPY (EGD) WITH PROPOFOL N/A 08/09/2014   Procedure: ESOPHAGOGASTRODUODENOSCOPY (EGD) WITH PROPOFOL;  Surgeon: Juanita Craver, MD;  [facility] ENDOSCOPY;  Service: Endoscopy;  Laterality: N/A;  . EYE SURGERY  11/01/2012   BOTH EYES CATARACTS  . INSERT / REPLACE / REMOVE PACEMAKER  10/06/10   MDT  implanted by Dr [name]  . KNEE ARTHROSCOPY     both  . LEFT HEART CATH AND CORONARY ANGIOGRAPHY N/A 03/24/2018   Procedure: LEFT HEART CATH AND CORONARY ANGIOGRAPHY;  Surgeon: Lorretta Harp, MD;  Location: [facility] CV LAB;  Service: Cardiovascular;  Laterality: N/A;  . OVARY SURGERY     removal  . REPAIR RECTOCELE    . SIMPLE MASTECTOMY WITH AXILLARY SENTINEL NODE BIOPSY Left 01/09/2015   Procedure: Irrigation and Drainage Abcess left Axilla;  Surgeon: Jackolyn Confer, MD;  Location: [facility] ORS;  Service: General;  Laterality: Left;  . TONSILLECTOMY  AGE 69  . TOTAL ABDOMINAL HYSTERECTOMY  1971    There were no vitals filed for this  visit.   Subjective Assessment - 04/21/21 1030    Subjective COVID-19 screen performed prior to patient entering clinic. Patient arrived and reported a fall after feet got crossed and fell backward, "doing ok just sore"    Pertinent History Bilateral knee ATS, h/o LBP, PACEMAKER, latex and bee sting allergies.    Patient Stated Goals Decreased pain with ADL's    Currently in Pain? Yes    Pain Score 5     Pain Location Back    Pain Orientation Lower    Pain Descriptors / Indicators Discomfort    Pain Type Chronic pain    Pain Onset 1 to 4 weeks ago    Pain Frequency Constant     Aggravating Factors  certain movements    Pain Relieving Factors at rest                             [facility] Adult PT Treatment/Exercise - 04/21/21 0001      Lumbar Exercises: Aerobic   Nustep L3 x 10 min UE/LE activity, monitored for progression, slow pace and core focus      Lumbar Exercises: Standing   Other Standing Lumbar Exercises core sets with red swiss ball x15 reps in front of chair for safety      Lumbar Exercises: Seated   Other Seated Lumbar Exercises glut set 5sec  x20      Manual Therapy   Manual Therapy Soft tissue mobilization    Soft tissue mobilization manual STW to R and L lumbar paraspinals and QL to reduce tone and pain                       PT Long Term Goals - 04/21/21 1033      PT LONG TERM GOAL #5   Title Decrease right LBP-level to no higher than a 3/10 when performing ADL's.    Baseline 5/10 pain currently some increased pain with ADL's 04/21/21    Time 6    Period Weeks    Status On-going                 Plan - 04/21/21 1047    Clinical Impression Statement Patient toilerated treatment well today. Patient reported 60% improvement overall with less back symptoms and less often. Patient able to progress slowly with abdominal bracing yet limited due to breathing difficulty. Pateint goal ongoing due to back pain deficts.    Personal Factors and Comorbidities Comorbidity 1;Other;Comorbidity 2    Comorbidities Bilateral knee ATS, h/o LBP, PACEMAKER, latex and bee sting allergies.    Examination-Activity Limitations Other    Examination-Participation Restrictions Other    Stability/Clinical Decision Making Stable/Uncomplicated    Rehab Potential Good    PT Frequency 2x / week    PT Duration 6 weeks    PT Treatment/Interventions ADLs/Self Care Home Management;Traction;Moist Heat;Therapeutic activities;Therapeutic exercise;Manual techniques;Patient/family education;Passive range of motion    PT Next Visit Plan Monitor  LBP symptoms and tolerance. MD F/U 05/20/21    Consulted and Agree with Plan of Care Patient           Patient will benefit from skilled therapeutic intervention in order to improve the following deficits and impairments:  Pain,Decreased activity tolerance,Decreased range of motion  Visit Diagnosis: Chronic right-sided low back pain without sciatica     Problem List Patient Active Problem List   Diagnosis Date Noted  . Acute on chronic diastolic (congestive)  heart failure ([place]) 05/06/2020  . Acute on chronic diastolic congestive heart failure ([place]) 05/05/2020  . Hypomagnesemia 05/05/2020  . CKD (chronic kidney disease), stage III ([place]) 05/05/2020  . Persistent atrial fibrillation ([place]) 02/20/2020  . Secondary hypercoagulable state ([place]) 02/20/2020  . Acute and chronic respiratory failure (acute-on-chronic) ([place]) 08/30/2019  . COPD exacerbation ([place]) 08/29/2019  . Chest pain 07/18/2018  . Sepsis due to urinary tract infection ([place]) 06/30/2018  . AKI (acute kidney injury) ([place]) 06/30/2018  . Sepsis, unspecified organism ([place]) 06/30/2018  . Normocytic anemia 06/30/2018  . Coronary artery disease due to lipid rich plaque   . Unstable angina ([place]) 03/23/2018  . Acute respiratory distress 09/26/2017  . Acute exacerbation of chronic obstructive pulmonary disease (COPD) ([place]) 09/26/2017  . 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 Type 2 diabetes mellitus ([place]) 09/26/2017  . Acute on chronic diastolic CHF (congestive heart failure) ([place]) 09/26/2017  . Cardiac pacemaker 11/17/2016  . Eczema 11/17/2016  . Major depression, recurrent, chronic ([place]) 09/17/2016  . Axillary abscess 07/22/2016  . Chronic left shoulder pain 10/01/2015  . Xerosis cutis 10/01/2015  . Paroxysmal atrial fibrillation ([place]) 05/24/2014  . PAF (paroxysmal atrial fibrillation) ([place]) 05/24/2014  . NAFLD (nonalcoholic fatty liver disease) 03/29/2014  . Renal cyst 03/29/2014  . Elevated TSH 03/06/2014   . Type 2 diabetes mellitus without complication, without long-term current use of insulin ([place]) 03/06/2014  . Peripheral neuropathy 04/27/2013  . Vitamin B12 deficiency 04/27/2013  . Abnormality of gait 03/29/2013  . Diabetic neuropathy ([place]) 03/29/2013  . OSA (obstructive sleep apnea) 08/26/2012  . Chronic asthmatic bronchitis ([place]) 08/26/2012  . Preop cardiovascular exam 08/15/2012  . Nocturnal hypoxia 04/08/2012  . Seborrheic dermatitis 10/21/2011  . IBS (irritable bowel syndrome) 05/27/2011  . Esophageal reflux 03/27/2011  . Mixed hyperlipidemia 03/27/2011  . Chronic obstructive pulmonary disease ([place]) 03/27/2011  . 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 Essential hypertension 02/09/2011  . Obesity 02/09/2011  . Tobacco use disorder 02/05/2011  . Vitamin D deficiency 12/30/2010  . Osteoarthrosis involving lower leg 12/26/2010  . Allergic rhinitis 01/23/2009  . Mixed incontinence 10/08/2008    Brandi Hunter, PTA 04/21/2021, 11:11 AM  [facility] [place], [place], [address] Phone: [phone]   Fax:  [phone]  Name: Brandi Hunter MRN: [id number] Date of Birth: 15-May-1941

## 2021-04-24 ENCOUNTER — Other Ambulatory Visit: Payer: Self-pay

## 2021-04-24 ENCOUNTER — Ambulatory Visit: Payer: Medicare HMO | Admitting: Physical Therapy

## 2021-04-24 DIAGNOSIS — G8929 Other chronic pain: Secondary | ICD-10-CM

## 2021-04-24 DIAGNOSIS — M542 Cervicalgia: Secondary | ICD-10-CM

## 2021-04-24 DIAGNOSIS — R293 Abnormal posture: Secondary | ICD-10-CM

## 2021-04-24 DIAGNOSIS — M545 Low back pain, unspecified: Secondary | ICD-10-CM | POA: Diagnosis not present

## 2021-04-24 NOTE — Therapy (Signed)
[facility] [place], [place], [address] Phone: [phone]   Fax:  [phone]  Physical Therapy Treatment  Patient Details  Name: Brandi Hunter MRN: [id number] Date of Birth: 1941-08-13 Referring Provider (PT): Dorene Ar MD   Encounter Date: 04/24/2021   PT End of Session - 04/24/21 1220     Visit Number 16    Number of Visits 24    Date for PT Re-Evaluation 05/07/21    Authorization Type PROGRESS NOTE AT 10TH VISIT.  KX MODIFIER AFTER 15 VISITS.    PT Start Time 1115    PT Stop Time 1157    PT Time Calculation (min) 42 min    Behavior During Therapy [facility] for tasks assessed/performed             Past Medical History:  Diagnosis Date   Asthma    CAD (coronary artery disease)    a. catheterization in 03/2018 showing 90% ostial OM stenosis with medical management recommended at that time   Chronic anticoagulation    COPD (chronic obstructive pulmonary disease) (HCC)    DJD (degenerative joint disease)    Essential hypertension    GERD (gastroesophageal reflux disease)    History of home oxygen therapy    Hyperlipidemia    Mobitz (type) II atrioventricular block    Obesity    Pacemaker    Implanted by Dr [name] (MDT) 10/06/10   PAF (paroxysmal atrial fibrillation) ([place])    Pancreatitis 2010 OR 2011   Sleep apnea    CPAP   Type 2 diabetes mellitus ([place])     Past Surgical History:  Procedure Laterality Date   CARDIOVERSION N/A 08/18/2019   Procedure: CARDIOVERSION;  Surgeon: Lelon Perla, MD;  Location: [facility];  Service: Cardiovascular;  Laterality: N/A;   CATARACT EXTRACTION W/PHACO  11/02/2011   Procedure: CATARACT EXTRACTION PHACO AND INTRAOCULAR LENS PLACEMENT ([place]);  Surgeon: Williams Che;  [facility];  Service: Ophthalmology;  Laterality: Right;  CDE=7.33   CATARACT EXTRACTION W/PHACO  12/07/2011   Procedure: CATARACT EXTRACTION PHACO AND INTRAOCULAR LENS PLACEMENT (IOC);   Surgeon: Williams Che, MD;  Location: [facility];  Service: Ophthalmology;  Laterality: Left;  CDE 3.61   COLONOSCOPY WITH PROPOFOL N/A 08/09/2014   Procedure: COLONOSCOPY WITH PROPOFOL;  Surgeon: Juanita Craver, MD;  [facility];  Service: Endoscopy;  Laterality: N/A;   CYSTOCELE REPAIR     ESOPHAGOGASTRODUODENOSCOPY (EGD) WITH PROPOFOL N/A 08/09/2014   Procedure: ESOPHAGOGASTRODUODENOSCOPY (EGD) WITH PROPOFOL;  Surgeon: Juanita Craver, MD;  [facility];  Service: Endoscopy;  Laterality: N/A;   EYE SURGERY  11/01/2012   BOTH EYES CATARACTS   INSERT / REPLACE / REMOVE PACEMAKER  10/06/10   MDT  implanted by Dr [name]   KNEE ARTHROSCOPY     both   LEFT HEART CATH AND CORONARY ANGIOGRAPHY N/A 03/24/2018   Procedure: LEFT HEART CATH AND CORONARY ANGIOGRAPHY;  Surgeon: Lorretta Harp, MD;  Location: [place] [facility];  Service: Cardiovascular;  Laterality: N/A;   OVARY SURGERY     removal   REPAIR RECTOCELE     SIMPLE MASTECTOMY WITH AXILLARY SENTINEL NODE BIOPSY Left 01/09/2015   Procedure: Irrigation and Drainage Abcess left Axilla;  Surgeon: Jackolyn Confer, MD;  [facility];  Service: General;  Laterality: Left;   TONSILLECTOMY  AGE 52   TOTAL ABDOMINAL HYSTERECTOMY  1971    There were no vitals filed for this visit.   Subjective Assessment -  04/24/21 1220     Subjective COVID-19 screen performed prior to patient entering clinic.  Doing pretty good.    Pertinent History Bilateral knee ATS, h/o LBP, PACEMAKER, latex and bee sting allergies.    Patient Stated Goals Decreased pain with ADL's    Currently in Pain? Yes    Pain Score 4     Pain Location Back    Pain Orientation Lower    Pain Descriptors / Indicators Discomfort    Pain Type Chronic pain                               [facility] Adult PT Treatment/Exercise - 04/24/21 0001       Exercises   Exercises Knee/Hip      Neck Exercises: Machines for Strengthening   Nustep Level 3 x 10  minutes.      Modalities   Modalities Moist Heat      Moist Heat Therapy   Number Minutes Moist Heat 15 Minutes    Moist Heat Location Lumbar Spine      Manual Therapy   Manual Therapy Soft tissue mobilization    Soft tissue mobilization STW/M x 14 minutes to patient's bilateral lower lumbar and upper gluteal musculature to reduce tone and pain.                         PT Long Term Goals - 04/21/21 1033       PT LONG TERM GOAL #5   Title Decrease right LBP-level to no higher than a 3/10 when performing ADL's.    Baseline 5/10 pain currently some increased pain with ADL's 04/21/21    Time 6    Period Weeks    Status On-going                   Plan - 04/24/21 1223     Clinical Impression Statement Less lumbar pain reported today with her CC being in the bilateral upper gluteal region today.  Good response to treatment wiht patient feeling better after treatment.    Comorbidities Bilateral knee ATS, h/o LBP, PACEMAKER, latex and bee sting allergies.    Examination-Activity Limitations Other    Examination-Participation Restrictions Other    Stability/Clinical Decision Making Stable/Uncomplicated    Rehab Potential Good    PT Frequency 2x / week    PT Treatment/Interventions ADLs/Self Care Home Management;Traction;Moist Heat;Therapeutic activities;Therapeutic exercise;Manual techniques;Patient/family education;Passive range of motion    Consulted and Agree with Plan of Care Patient             Patient will benefit from skilled therapeutic intervention in order to improve the following deficits and impairments:  Pain, Decreased activity tolerance, Decreased range of motion  Visit Diagnosis: Chronic right-sided low back pain without sciatica  Cervicalgia  Abnormal posture     Problem List Patient Active Problem List   Diagnosis Date Noted   Acute on chronic diastolic (congestive) heart failure ([place]) 05/06/2020   Acute on chronic diastolic  congestive heart failure ([place]) 05/05/2020   Hypomagnesemia 05/05/2020   CKD (chronic kidney disease), stage III ([place]) 05/05/2020   Persistent atrial fibrillation ([place]) 02/20/2020   Secondary hypercoagulable state ([place]) 02/20/2020   Acute and chronic respiratory failure (acute-on-chronic) ([place]) 08/30/2019   COPD exacerbation ([place]) 08/29/2019   Chest pain 07/18/2018   Sepsis due to urinary tract infection ([place]) 06/30/2018   AKI (acute kidney injury) ([place]) 06/30/2018  Sepsis, unspecified organism ([place]) 06/30/2018   Normocytic anemia 06/30/2018   Coronary artery disease due to lipid rich plaque    Unstable angina ([place]) 03/23/2018   Acute respiratory distress 09/26/2017   Acute exacerbation of chronic obstructive pulmonary disease (COPD) ([place]) 09/26/2017   Dependence on nocturnal oxygen therapy 09/26/2017   Type 2 diabetes mellitus ([place]) 09/26/2017   Acute on chronic diastolic CHF (congestive heart failure) ([place]) 09/26/2017   Cardiac pacemaker 11/17/2016   Eczema 11/17/2016   Major depression, recurrent, chronic ([place]) 09/17/2016   Axillary abscess 07/22/2016   Chronic left shoulder pain 10/01/2015   Xerosis cutis 10/01/2015   Paroxysmal atrial fibrillation (HCC) 05/24/2014   PAF (paroxysmal atrial fibrillation) ([place]) 05/24/2014   NAFLD (nonalcoholic fatty liver disease) 03/29/2014   Renal cyst 03/29/2014   Elevated TSH 03/06/2014   Type 2 diabetes mellitus without complication, without long-term current use of insulin ([place]) 03/06/2014   Peripheral neuropathy 04/27/2013   Vitamin B12 deficiency 04/27/2013   Abnormality of gait 03/29/2013   Diabetic neuropathy ([place]) 03/29/2013   OSA (obstructive sleep apnea) 08/26/2012   Chronic asthmatic bronchitis ([place]) 08/26/2012   Preop cardiovascular exam 08/15/2012   Nocturnal hypoxia 04/08/2012   Seborrheic dermatitis 10/21/2011   IBS (irritable bowel syndrome) 05/27/2011   Esophageal reflux 03/27/2011   Mixed hyperlipidemia 03/27/2011    Chronic obstructive pulmonary disease ([place]) 03/27/2011   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

## 2021-04-28 ENCOUNTER — Other Ambulatory Visit: Payer: Self-pay

## 2021-04-28 ENCOUNTER — Ambulatory Visit: Payer: Medicare HMO | Admitting: Physical Therapy

## 2021-04-28 DIAGNOSIS — M545 Low back pain, unspecified: Secondary | ICD-10-CM | POA: Diagnosis not present

## 2021-04-28 DIAGNOSIS — G8929 Other chronic pain: Secondary | ICD-10-CM

## 2021-04-28 NOTE — Therapy (Signed)
[facility] [place], [place], [address] Phone: [phone]   Fax:  [phone]  Physical Therapy Treatment  Patient Details  Name: Brandi Hunter MRN: [id number] Date of Birth: 07-Jun-1941 Referring Provider (PT): Dorene Ar MD   Encounter Date: 04/28/2021   PT End of Session - 04/28/21 1035     Visit Number 17    Number of Visits 24    Date for PT Re-Evaluation 05/07/21    Authorization Type PROGRESS NOTE AT 10TH VISIT.  KX MODIFIER AFTER 15 VISITS.    PT Start Time 1030    PT Stop Time 1110    PT Time Calculation (min) 40 min    Activity Tolerance Patient tolerated treatment well    Behavior During Therapy WFL for tasks assessed/performed             Past Medical History:  Diagnosis Date   Asthma    CAD (coronary artery disease)    a. catheterization in 03/2018 showing 90% ostial OM stenosis with medical management recommended at that time   Chronic anticoagulation    COPD (chronic obstructive pulmonary disease) (HCC)    DJD (degenerative joint disease)    Essential hypertension    GERD (gastroesophageal reflux disease)    History of home oxygen therapy    Hyperlipidemia    Mobitz (type) II atrioventricular block    Obesity    Pacemaker    Implanted by Dr [name] (MDT) 10/06/10   PAF (paroxysmal atrial fibrillation) ([place])    Pancreatitis 2010 OR 2011   Sleep apnea    CPAP   Type 2 diabetes mellitus ([place])     Past Surgical History:  Procedure Laterality Date   CARDIOVERSION N/A 08/18/2019   Procedure: CARDIOVERSION;  Surgeon: Lelon Perla, MD;  Location: [facility];  Service: Cardiovascular;  Laterality: N/A;   CATARACT EXTRACTION W/PHACO  11/02/2011   Procedure: CATARACT EXTRACTION PHACO AND INTRAOCULAR LENS PLACEMENT ([place]);  Surgeon: Williams Che;  [facility];  Service: Ophthalmology;  Laterality: Right;  CDE=7.33   CATARACT EXTRACTION W/PHACO  12/07/2011   Procedure: CATARACT  EXTRACTION PHACO AND INTRAOCULAR LENS PLACEMENT (IOC);  Surgeon: Williams Che, MD;  [facility];  Service: Ophthalmology;  Laterality: Left;  CDE 3.61   COLONOSCOPY WITH PROPOFOL N/A 08/09/2014   Procedure: COLONOSCOPY WITH PROPOFOL;  Surgeon: Juanita Craver, MD;  [facility];  Service: Endoscopy;  Laterality: N/A;   CYSTOCELE REPAIR     ESOPHAGOGASTRODUODENOSCOPY (EGD) WITH PROPOFOL N/A 08/09/2014   Procedure: ESOPHAGOGASTRODUODENOSCOPY (EGD) WITH PROPOFOL;  Surgeon: Juanita Craver, MD;  [facility];  Service: Endoscopy;  Laterality: N/A;   EYE SURGERY  11/01/2012   BOTH EYES CATARACTS   INSERT / REPLACE / REMOVE PACEMAKER  10/06/10   MDT  implanted by Dr [name]   KNEE ARTHROSCOPY     both   LEFT HEART CATH AND CORONARY ANGIOGRAPHY N/A 03/24/2018   Procedure: LEFT HEART CATH AND CORONARY ANGIOGRAPHY;  Surgeon: Lorretta Harp, MD;  Location: [facility];  Service: Cardiovascular;  Laterality: N/A;   OVARY SURGERY     removal   REPAIR RECTOCELE     SIMPLE MASTECTOMY WITH AXILLARY SENTINEL NODE BIOPSY Left 01/09/2015   Procedure: Irrigation and Drainage Abcess left Axilla;  Surgeon: Jackolyn Confer, MD;  [facility];  Service: General;  Laterality: Left;   TONSILLECTOMY  AGE 80   TOTAL ABDOMINAL HYSTERECTOMY  1971    There were no vitals  filed for this visit.   Subjective Assessment - 04/28/21 1033     Subjective COVID-19 screen performed prior to patient entering clinic.  Patient arrived doing well with less back pain, yet ongoing difficulty breathing due to hot weather. Patient did have a fall over weekend and was ok with no injury    Pertinent History Bilateral knee ATS, h/o LBP, PACEMAKER, latex and bee sting allergies.    Patient Stated Goals Decreased pain with ADL's    Currently in Pain? Yes    Pain Score 3     Pain Location Back    Pain Orientation Lower    Pain Descriptors / Indicators Discomfort    Pain Type Chronic pain    Pain Onset  More than a month ago    Pain Frequency Constant    Aggravating Factors  certain movements    Pain Relieving Factors rest                               [facility] Adult PT Treatment/Exercise - 04/28/21 0001       Lumbar Exercises: Aerobic   Nustep L3x39mn UE/LE      Lumbar Exercises: Standing   Other Standing Lumbar Exercises --    Other Standing Lumbar Exercises seated hip abd with bracing and red band x30      Lumbar Exercises: Seated   Other Seated Lumbar Exercises glut set 5sec  x20    Other Seated Lumbar Exercises seated core press downs x20 with red swiss ball      Manual Therapy   Manual Therapy Soft tissue mobilization    Soft tissue mobilization manual STW to patient's bilateral lower lumbar and upper gluteal musculature to reduce tone and pain.                         PT Long Term Goals - 04/28/21 1036       PT LONG TERM GOAL #5   Title Decrease right LBP-level to no higher than a 3/10 when performing ADL's.    Baseline 3/10 pain currently yet increased pain with ADL's 04/28/21    Time 6    Period Weeks    Status Partially Met                   Plan - 04/28/21 1039     Clinical Impression Statement Patient tolerated treatment well today. Patient has reported overall improvement and less pain today at 3/10. Patient only able to progress minimal with ther ex due to breathing difficulty. Patient's pain will increase some after ADL's. Current goal progressing and improvement reported. Good response to treatments thus far.    Personal Factors and Comorbidities Comorbidity 1;Other;Comorbidity 2    Comorbidities Bilateral knee ATS, h/o LBP, PACEMAKER, latex and bee sting allergies.    Examination-Participation Restrictions Other    Stability/Clinical Decision Making Stable/Uncomplicated    Rehab Potential Good    PT Frequency 2x / week    PT Duration 6 weeks    PT Treatment/Interventions ADLs/Self Care Home  Management;Traction;Moist Heat;Therapeutic activities;Therapeutic exercise;Manual techniques;Patient/family education;Passive range of motion    PT Next Visit Plan Monitor LBP symptoms and tolerance. MD F/U 05/20/21    Consulted and Agree with Plan of Care Patient             Patient will benefit from skilled therapeutic intervention in order to improve the following deficits and impairments:  Pain, Decreased activity tolerance, Decreased range of motion  Visit Diagnosis: Chronic right-sided low back pain without sciatica     Problem List Patient Active Problem List   Diagnosis Date Noted   Acute on chronic diastolic (congestive) heart failure (HCC) 05/06/2020   Acute on chronic diastolic congestive heart failure ([place]) 05/05/2020   Hypomagnesemia 05/05/2020   CKD (chronic kidney disease), stage III ([place]) 05/05/2020   Persistent atrial fibrillation ([place]) 02/20/2020   Secondary hypercoagulable state ([place]) 02/20/2020   Acute and chronic respiratory failure (acute-on-chronic) ([place]) 08/30/2019   COPD exacerbation ([place]) 08/29/2019   Chest pain 07/18/2018   Sepsis due to urinary tract infection ([place]) 06/30/2018   AKI (acute kidney injury) ([place]) 06/30/2018   Sepsis, unspecified organism ([place]) 06/30/2018   Normocytic anemia 06/30/2018   Coronary artery disease due to lipid rich plaque    Unstable angina ([place]) 03/23/2018   Acute respiratory distress 09/26/2017   Acute exacerbation of chronic obstructive pulmonary disease (COPD) ([place]) 09/26/2017   Dependence on nocturnal oxygen therapy 09/26/2017   Type 2 diabetes mellitus ([place]) 09/26/2017   Acute on chronic diastolic CHF (congestive heart failure) ([place]) 09/26/2017   Cardiac pacemaker 11/17/2016   Eczema 11/17/2016   Major depression, recurrent, chronic ([place]) 09/17/2016   Axillary abscess 07/22/2016   Chronic left shoulder pain 10/01/2015   Xerosis cutis 10/01/2015   Paroxysmal atrial fibrillation (HCC) 05/24/2014   PAF (paroxysmal  atrial fibrillation) ([place]) 05/24/2014   NAFLD (nonalcoholic fatty liver disease) 03/29/2014   Renal cyst 03/29/2014   Elevated TSH 03/06/2014   Type 2 diabetes mellitus without complication, without long-term current use of insulin ([place]) 03/06/2014   Peripheral neuropathy 04/27/2013   Vitamin B12 deficiency 04/27/2013   Abnormality of gait 03/29/2013   Diabetic neuropathy ([place]) 03/29/2013   OSA (obstructive sleep apnea) 08/26/2012   Chronic asthmatic bronchitis ([place]) 08/26/2012   Preop cardiovascular exam 08/15/2012   Nocturnal hypoxia 04/08/2012   Seborrheic dermatitis 10/21/2011   IBS (irritable bowel syndrome) 05/27/2011   Esophageal reflux 03/27/2011   Mixed hyperlipidemia 03/27/2011   Chronic obstructive pulmonary disease ([place]) 03/27/2011   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 [place], [place], [address] Phone: [phone]   Fax:  [phone]  Name: Brandi Hunter MRN: [id number] Date of Birth: 04/08/1941

## 2021-04-28 NOTE — Therapy (Signed)
[facility] [place], [place], [address] Phone: [phone]   Fax:  [phone]  Physical Therapy Treatment  Patient Details  Name: Brandi Hunter MRN: [id number] Date of Birth: 1941-04-16 Referring Provider (PT): Dorene Ar MD   Encounter Date: 04/28/2021   PT End of Session - 04/28/21 1035     Visit Number 17    Number of Visits 24    Date for PT Re-Evaluation 05/07/21    Authorization Type PROGRESS NOTE AT 10TH VISIT.  KX MODIFIER AFTER 15 VISITS.    PT Start Time 1030    PT Stop Time 1110    PT Time Calculation (min) 40 min    Activity Tolerance Patient tolerated treatment well    Behavior During Therapy WFL for tasks assessed/performed             Past Medical History:  Diagnosis Date   Asthma    CAD (coronary artery disease)    a. catheterization in 03/2018 showing 90% ostial OM stenosis with medical management recommended at that time   Chronic anticoagulation    COPD (chronic obstructive pulmonary disease) (HCC)    DJD (degenerative joint disease)    Essential hypertension    GERD (gastroesophageal reflux disease)    History of home oxygen therapy    Hyperlipidemia    Mobitz (type) II atrioventricular block    Obesity    Pacemaker    Implanted by Dr [name] (MDT) 10/06/10   PAF (paroxysmal atrial fibrillation) ([place])    Pancreatitis 2010 OR 2011   Sleep apnea    CPAP   Type 2 diabetes mellitus ([place])     Past Surgical History:  Procedure Laterality Date   CARDIOVERSION N/A 08/18/2019   Procedure: CARDIOVERSION;  Surgeon: Lelon Perla, MD;  Location: [facility];  Service: Cardiovascular;  Laterality: N/A;   CATARACT EXTRACTION W/PHACO  11/02/2011   Procedure: CATARACT EXTRACTION PHACO AND INTRAOCULAR LENS PLACEMENT ([place]);  Surgeon: Williams Che;  [facility];  Service: Ophthalmology;  Laterality: Right;  CDE=7.33   CATARACT EXTRACTION W/PHACO  12/07/2011   Procedure: CATARACT  EXTRACTION PHACO AND INTRAOCULAR LENS PLACEMENT (IOC);  Surgeon: Williams Che, MD;  Location: [facility];  Service: Ophthalmology;  Laterality: Left;  CDE 3.61   COLONOSCOPY WITH PROPOFOL N/A 08/09/2014   Procedure: COLONOSCOPY WITH PROPOFOL;  Surgeon: Juanita Craver, MD;  [facility];  Service: Endoscopy;  Laterality: N/A;   CYSTOCELE REPAIR     ESOPHAGOGASTRODUODENOSCOPY (EGD) WITH PROPOFOL N/A 08/09/2014   Procedure: ESOPHAGOGASTRODUODENOSCOPY (EGD) WITH PROPOFOL;  Surgeon: Juanita Craver, MD;  [facility];  Service: Endoscopy;  Laterality: N/A;   EYE SURGERY  11/01/2012   BOTH EYES CATARACTS   INSERT / REPLACE / REMOVE PACEMAKER  10/06/10   MDT  implanted by Dr [name]   KNEE ARTHROSCOPY     both   LEFT HEART CATH AND CORONARY ANGIOGRAPHY N/A 03/24/2018   Procedure: LEFT HEART CATH AND CORONARY ANGIOGRAPHY;  Surgeon: Lorretta Harp, MD;  Location: [facility];  Service: Cardiovascular;  Laterality: N/A;   OVARY SURGERY     removal   REPAIR RECTOCELE     SIMPLE MASTECTOMY WITH AXILLARY SENTINEL NODE BIOPSY Left 01/09/2015   Procedure: Irrigation and Drainage Abcess left Axilla;  Surgeon: Jackolyn Confer, MD;  [facility];  Service: General;  Laterality: Left;   TONSILLECTOMY  AGE 35   TOTAL ABDOMINAL HYSTERECTOMY  1971    There were no vitals  filed for this visit.   Subjective Assessment - 04/28/21 1033     Subjective COVID-19 screen performed prior to patient entering clinic.  Patient arrived doing well with less back pain, yet ongoing difficulty breathing due to hot weather. Patient did have a fall over weekend and was ok with no injury    Pertinent History Bilateral knee ATS, h/o LBP, PACEMAKER, latex and bee sting allergies.    Patient Stated Goals Decreased pain with ADL's    Currently in Pain? Yes    Pain Score 3     Pain Location Back    Pain Orientation Lower    Pain Descriptors / Indicators Discomfort    Pain Type Chronic pain    Pain Onset  More than a month ago    Pain Frequency Constant    Aggravating Factors  certain movements    Pain Relieving Factors rest                               [facility] Adult PT Treatment/Exercise - 04/28/21 0001       Lumbar Exercises: Aerobic   Nustep L3x41mn UE/LE      Lumbar Exercises: Standing   Other Standing Lumbar Exercises --    Other Standing Lumbar Exercises seated hip abd with bracing and red band x30      Lumbar Exercises: Seated   Other Seated Lumbar Exercises glut set 5sec  x20    Other Seated Lumbar Exercises seated core press downs x20 with red swiss ball      Manual Therapy   Manual Therapy Soft tissue mobilization    Soft tissue mobilization manual STW to patient's bilateral lower lumbar and upper gluteal musculature to reduce tone and pain.                    PT Education - 04/28/21 1114     Education Details education on falling prevention and technique of getting up off the floor to surfce or chair    Person(s) Educated Patient    Methods Explanation;Demonstration    Comprehension Verbalized understanding                 PT Long Term Goals - 04/28/21 1036       PT LONG TERM GOAL #5   Title Decrease right LBP-level to no higher than a 3/10 when performing ADL's.    Baseline 3/10 pain currently yet increased pain with ADL's 04/28/21    Time 6    Period Weeks    Status Partially Met                   Plan - 04/28/21 1039     Clinical Impression Statement Patient tolerated treatment well today. Patient has reported overall improvement and less pain today at 3/10. Patient only able to progress minimal with ther ex due to breathing difficulty. Patient's pain will increase some after ADL's. Current goal progressing and improvement reported. Good response to treatments thus far.    Personal Factors and Comorbidities Comorbidity 1;Other;Comorbidity 2    Comorbidities Bilateral knee ATS, h/o LBP, PACEMAKER, latex and  bee sting allergies.    Examination-Participation Restrictions Other    Stability/Clinical Decision Making Stable/Uncomplicated    Rehab Potential Good    PT Frequency 2x / week    PT Duration 6 weeks    PT Treatment/Interventions ADLs/Self Care Home Management;Traction;Moist Heat;Therapeutic activities;Therapeutic exercise;Manual techniques;Patient/family education;Passive range of  motion    PT Next Visit Plan Monitor LBP symptoms and tolerance. MD F/U 05/20/21    Consulted and Agree with Plan of Care Patient             Patient will benefit from skilled therapeutic intervention in order to improve the following deficits and impairments:  Pain, Decreased activity tolerance, Decreased range of motion  Visit Diagnosis: Chronic right-sided low back pain without sciatica     Problem List Patient Active Problem List   Diagnosis Date Noted   Acute on chronic diastolic (congestive) heart failure ([place]) 05/06/2020   Acute on chronic diastolic congestive heart failure ([place]) 05/05/2020   Hypomagnesemia 05/05/2020   CKD (chronic kidney disease), stage III ([place]) 05/05/2020   Persistent atrial fibrillation ([place]) 02/20/2020   Secondary hypercoagulable state ([place]) 02/20/2020   Acute and chronic respiratory failure (acute-on-chronic) ([place]) 08/30/2019   COPD exacerbation ([place]) 08/29/2019   Chest pain 07/18/2018   Sepsis due to urinary tract infection ([place]) 06/30/2018   AKI (acute kidney injury) ([place]) 06/30/2018   Sepsis, unspecified organism ([place]) 06/30/2018   Normocytic anemia 06/30/2018   Coronary artery disease due to lipid rich plaque    Unstable angina ([place]) 03/23/2018   Acute respiratory distress 09/26/2017   Acute exacerbation of chronic obstructive pulmonary disease (COPD) ([place]) 09/26/2017   Dependence on nocturnal oxygen therapy 09/26/2017   Type 2 diabetes mellitus ([place]) 09/26/2017   Acute on chronic diastolic CHF (congestive heart failure) ([place]) 09/26/2017   Cardiac pacemaker  11/17/2016   Eczema 11/17/2016   Major depression, recurrent, chronic ([place]) 09/17/2016   Axillary abscess 07/22/2016   Chronic left shoulder pain 10/01/2015   Xerosis cutis 10/01/2015   Paroxysmal atrial fibrillation (HCC) 05/24/2014   PAF (paroxysmal atrial fibrillation) ([place]) 05/24/2014   NAFLD (nonalcoholic fatty liver disease) 03/29/2014   Renal cyst 03/29/2014   Elevated TSH 03/06/2014   Type 2 diabetes mellitus without complication, without long-term current use of insulin ([place]) 03/06/2014   Peripheral neuropathy 04/27/2013   Vitamin B12 deficiency 04/27/2013   Abnormality of gait 03/29/2013   Diabetic neuropathy ([place]) 03/29/2013   OSA (obstructive sleep apnea) 08/26/2012   Chronic asthmatic bronchitis ([place]) 08/26/2012   Preop cardiovascular exam 08/15/2012   Nocturnal hypoxia 04/08/2012   Seborrheic dermatitis 10/21/2011   IBS (irritable bowel syndrome) 05/27/2011   Esophageal reflux 03/27/2011   Mixed hyperlipidemia 03/27/2011   Chronic obstructive pulmonary disease ([place]) 03/27/2011   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

## 2021-05-01 ENCOUNTER — Other Ambulatory Visit: Payer: Self-pay

## 2021-05-01 ENCOUNTER — Ambulatory Visit: Payer: Medicare HMO | Admitting: Physical Therapy

## 2021-05-01 DIAGNOSIS — M545 Low back pain, unspecified: Secondary | ICD-10-CM | POA: Diagnosis not present

## 2021-05-01 DIAGNOSIS — G8929 Other chronic pain: Secondary | ICD-10-CM

## 2021-05-01 NOTE — Patient Instructions (Signed)
Pelvic Tilt: Posterior - Legs Bent (Supine)  Tighten stomach and buttock and flatten back by rolling pelvis down. Hold _10___ seconds. Relax. Repeat _10-30___ times per set. Do __2__ sets per session. Do _2___ sessions per day.   Heel Raise (Sitting)    Raise heels, keeping toes on floor.  Tighten abdomin and buttock (draw in) Repeat __10__ times per set. Do __1-2__ sets per session. Do __1-2__ sessions per day.   FLEXION: Sitting (Active)    Sit, both feet flat. Lift right knee toward ceiling. Tighten abdomin and buttock (draw in) Complete _10__ sets of _1-2__ repetitions. Perform _1-2__ sessions per day.    SEATED BALL OVERHEAD PRESS  Start in a seated position in a chair while holding an exercise ball against your chest. Slowly lift the ball up overhead as you extend your elbows. Return the ball to your chest and repeat. Perfrom with abdominal bracing and buttock squeeze   Lower Body: Toe Rise       Hip abduction   While sitting with good posture, tie theraband around knees and pull apart. Slowly resume starting position. x30 1-2 x day       SEATED MARCHING - ELASTIC BAND  Start by sitting in a chair with an elastic band wrapped around your lower thighs.  Next, move a knee upward, set it back down and then alternate to the other side. Perform 10-20 1-2x daily

## 2021-05-01 NOTE — Therapy (Signed)
[facility] [place], [place], [address] Phone: [phone]   Fax:  [phone]  Physical Therapy Treatment  Patient Details  Name: Brandi Hunter MRN: [id number] Date of Birth: 31-Dec-1940 Referring Provider (PT): Dorene Ar MD   Encounter Date: 05/01/2021   PT End of Session - 05/01/21 1039     Visit Number 18    Number of Visits 24    Date for PT Re-Evaluation 05/07/21    Authorization Type PROGRESS NOTE AT 10TH VISIT.  KX MODIFIER AFTER 15 VISITS.    PT Start Time 1030    PT Stop Time 1113    PT Time Calculation (min) 43 min    Activity Tolerance Patient tolerated treatment well;Treatment limited secondary to medical complications (Comment)    Behavior During Therapy [facility] for tasks assessed/performed             Past Medical History:  Diagnosis Date   Asthma    CAD (coronary artery disease)    a. catheterization in 03/2018 showing 90% ostial OM stenosis with medical management recommended at that time   Chronic anticoagulation    COPD (chronic obstructive pulmonary disease) (HCC)    DJD (degenerative joint disease)    Essential hypertension    GERD (gastroesophageal reflux disease)    History of home oxygen therapy    Hyperlipidemia    Mobitz (type) II atrioventricular block    Obesity    Pacemaker    Implanted by Dr [name] (MDT) 10/06/10   PAF (paroxysmal atrial fibrillation) ([place])    Pancreatitis 2010 OR 2011   Sleep apnea    CPAP   Type 2 diabetes mellitus ([place])     Past Surgical History:  Procedure Laterality Date   CARDIOVERSION N/A 08/18/2019   Procedure: CARDIOVERSION;  Surgeon: Lelon Perla, MD;  Location: [facility];  Service: Cardiovascular;  Laterality: N/A;   CATARACT EXTRACTION W/PHACO  11/02/2011   Procedure: CATARACT EXTRACTION PHACO AND INTRAOCULAR LENS PLACEMENT ([place]);  Surgeon: Williams Che;  [facility];  Service: Ophthalmology;  Laterality: Right;  CDE=7.33    CATARACT EXTRACTION W/PHACO  12/07/2011   Procedure: CATARACT EXTRACTION PHACO AND INTRAOCULAR LENS PLACEMENT (IOC);  Surgeon: Williams Che, MD;  [facility];  Service: Ophthalmology;  Laterality: Left;  CDE 3.61   COLONOSCOPY WITH PROPOFOL N/A 08/09/2014   Procedure: COLONOSCOPY WITH PROPOFOL;  Surgeon: Juanita Craver, MD;  [facility];  Service: Endoscopy;  Laterality: N/A;   CYSTOCELE REPAIR     ESOPHAGOGASTRODUODENOSCOPY (EGD) WITH PROPOFOL N/A 08/09/2014   Procedure: ESOPHAGOGASTRODUODENOSCOPY (EGD) WITH PROPOFOL;  Surgeon: Juanita Craver, MD;  [facility];  Service: Endoscopy;  Laterality: N/A;   EYE SURGERY  11/01/2012   BOTH EYES CATARACTS   INSERT / REPLACE / REMOVE PACEMAKER  10/06/10   MDT  implanted by Dr [name]   KNEE ARTHROSCOPY     both   LEFT HEART CATH AND CORONARY ANGIOGRAPHY N/A 03/24/2018   Procedure: LEFT HEART CATH AND CORONARY ANGIOGRAPHY;  Surgeon: Lorretta Harp, MD;  Location: [facility];  Service: Cardiovascular;  Laterality: N/A;   OVARY SURGERY     removal   REPAIR RECTOCELE     SIMPLE MASTECTOMY WITH AXILLARY SENTINEL NODE BIOPSY Left 01/09/2015   Procedure: Irrigation and Drainage Abcess left Axilla;  Surgeon: Jackolyn Confer, MD;  [facility];  Service: General;  Laterality: Left;   TONSILLECTOMY  AGE [place]  There were no vitals filed for this visit.   Subjective Assessment - 05/01/21 1036     Subjective COVID-19 screen performed prior to patient entering clinic.  Patient reported doing ok today, breathing difficulty and headache yet less back pain    Pertinent History Bilateral knee ATS, h/o LBP, PACEMAKER, latex and bee sting allergies.    Patient Stated Goals Decreased pain with ADL's    Currently in Pain? Yes    Pain Score 1     Pain Location Back    Pain Orientation Lower    Pain Descriptors / Indicators Discomfort    Pain Type Chronic pain    Pain Onset More than a month  ago    Pain Frequency Constant    Aggravating Factors  certain activity    Pain Relieving Factors at rest                               [facility] Adult PT Treatment/Exercise - 05/01/21 0001       Lumbar Exercises: Aerobic   Nustep L3x12 min UE/LE      Lumbar Exercises: Standing   Other Standing Lumbar Exercises seated hip abd with bracing and red band x30      Lumbar Exercises: Seated   Other Seated Lumbar Exercises glut set 5sec  x20, ball overhead 2x10 with bracing    Other Seated Lumbar Exercises latt pull with yellow band 2x10 seated      Manual Therapy   Manual Therapy Soft tissue mobilization    Soft tissue mobilization manual STW to patient's bilateral lower lumbar and upper gluteal musculature to reduce tone and pain.                    PT Education - 05/01/21 1055     Education Details HEP progression    Person(s) Educated Patient    Methods Explanation;Demonstration;Handout    Comprehension Verbalized understanding;Returned demonstration                 PT Long Term Goals - 04/28/21 1036       PT LONG TERM GOAL #5   Title Decrease right LBP-level to no higher than a 3/10 when performing ADL's.    Baseline 3/10 pain currently yet increased pain with ADL's 04/28/21    Time 6    Period Weeks    Status Partially Met                   Plan - 05/01/21 1115     Clinical Impression Statement Patient tolerated treatment well today with less pain in back. Patient issued HEP for progression. Patient is limited with breathing probleems and headache today. Patient able to progress with ther ex today. Goal progressing this week.    Personal Factors and Comorbidities Comorbidity 1;Other;Comorbidity 2    Comorbidities Bilateral knee ATS, h/o LBP, PACEMAKER, latex and bee sting allergies.    Examination-Activity Limitations Other    Examination-Participation Restrictions Other    Stability/Clinical Decision Making  Stable/Uncomplicated    Rehab Potential Good    PT Frequency 2x / week    PT Duration 6 weeks    PT Treatment/Interventions ADLs/Self Care Home Management;Traction;Moist Heat;Therapeutic activities;Therapeutic exercise;Manual techniques;Patient/family education;Passive range of motion    PT Next Visit Plan Monitor LBP symptoms and tolerance. MD F/U 05/20/21    Consulted and Agree with Plan of Care Patient  Patient will benefit from skilled therapeutic intervention in order to improve the following deficits and impairments:  Pain, Decreased activity tolerance, Decreased range of motion  Visit Diagnosis: Chronic right-sided low back pain without sciatica     Problem List Patient Active Problem List   Diagnosis Date Noted   Acute on chronic diastolic (congestive) heart failure (HCC) 05/06/2020   Acute on chronic diastolic congestive heart failure ([place]) 05/05/2020   Hypomagnesemia 05/05/2020   CKD (chronic kidney disease), stage III ([place]) 05/05/2020   Persistent atrial fibrillation ([place]) 02/20/2020   Secondary hypercoagulable state ([place]) 02/20/2020   Acute and chronic respiratory failure (acute-on-chronic) ([place]) 08/30/2019   COPD exacerbation ([place]) 08/29/2019   Chest pain 07/18/2018   Sepsis due to urinary tract infection ([place]) 06/30/2018   AKI (acute kidney injury) ([place]) 06/30/2018   Sepsis, unspecified organism ([place]) 06/30/2018   Normocytic anemia 06/30/2018   Coronary artery disease due to lipid rich plaque    Unstable angina ([place]) 03/23/2018   Acute respiratory distress 09/26/2017   Acute exacerbation of chronic obstructive pulmonary disease (COPD) ([place]) 09/26/2017   Dependence on nocturnal oxygen therapy 09/26/2017   Type 2 diabetes mellitus ([place]) 09/26/2017   Acute on chronic diastolic CHF (congestive heart failure) ([place]) 09/26/2017   Cardiac pacemaker 11/17/2016   Eczema 11/17/2016   Major depression, recurrent, chronic ([place]) 09/17/2016   Axillary abscess  07/22/2016   Chronic left shoulder pain 10/01/2015   Xerosis cutis 10/01/2015   Paroxysmal atrial fibrillation (HCC) 05/24/2014   PAF (paroxysmal atrial fibrillation) ([place]) 05/24/2014   NAFLD (nonalcoholic fatty liver disease) 03/29/2014   Renal cyst 03/29/2014   Elevated TSH 03/06/2014   Type 2 diabetes mellitus without complication, without long-term current use of insulin ([place]) 03/06/2014   Peripheral neuropathy 04/27/2013   Vitamin B12 deficiency 04/27/2013   Abnormality of gait 03/29/2013   Diabetic neuropathy ([place]) 03/29/2013   OSA (obstructive sleep apnea) 08/26/2012   Chronic asthmatic bronchitis ([place]) 08/26/2012   Preop cardiovascular exam 08/15/2012   Nocturnal hypoxia 04/08/2012   Seborrheic dermatitis 10/21/2011   IBS (irritable bowel syndrome) 05/27/2011   Esophageal reflux 03/27/2011   Mixed hyperlipidemia 03/27/2011   Chronic obstructive pulmonary disease ([place]) 03/27/2011   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

## 2021-05-05 ENCOUNTER — Ambulatory Visit: Payer: Medicare HMO | Admitting: Physical Therapy

## 2021-05-05 ENCOUNTER — Other Ambulatory Visit: Payer: Self-pay

## 2021-05-05 DIAGNOSIS — G8929 Other chronic pain: Secondary | ICD-10-CM

## 2021-05-05 DIAGNOSIS — M545 Low back pain, unspecified: Secondary | ICD-10-CM | POA: Diagnosis not present

## 2021-05-05 NOTE — Therapy (Addendum)
[facility] [place], [place], [address] Phone: [phone]   Fax:  [phone]  Physical Therapy Treatment  Patient Details  Name: Brandi Hunter MRN: [id number] Date of Birth: January 30, 1941 Referring Provider (PT): Dorene Ar MD   Encounter Date: 05/05/2021   PT End of Session - 05/05/21 1032     Visit Number 19    Number of Visits 24    Date for PT Re-Evaluation 05/07/21    Authorization Type PROGRESS NOTE AT 10TH VISIT.  KX MODIFIER AFTER 15 VISITS.    PT Start Time 1030    PT Stop Time 1112    PT Time Calculation (min) 42 min    Activity Tolerance Patient tolerated treatment well    Behavior During Therapy WFL for tasks assessed/performed             Past Medical History:  Diagnosis Date   Asthma    CAD (coronary artery disease)    a. catheterization in 03/2018 showing 90% ostial OM stenosis with medical management recommended at that time   Chronic anticoagulation    COPD (chronic obstructive pulmonary disease) (HCC)    DJD (degenerative joint disease)    Essential hypertension    GERD (gastroesophageal reflux disease)    History of home oxygen therapy    Hyperlipidemia    Mobitz (type) II atrioventricular block    Obesity    Pacemaker    Implanted by Dr [name] (MDT) 10/06/10   PAF (paroxysmal atrial fibrillation) ([place])    Pancreatitis 2010 OR 2011   Sleep apnea    CPAP   Type 2 diabetes mellitus ([place])     Past Surgical History:  Procedure Laterality Date   CARDIOVERSION N/A 08/18/2019   Procedure: CARDIOVERSION;  Surgeon: Lelon Perla, MD;  Location: [facility];  Service: Cardiovascular;  Laterality: N/A;   CATARACT EXTRACTION W/PHACO  11/02/2011   Procedure: CATARACT EXTRACTION PHACO AND INTRAOCULAR LENS PLACEMENT ([place]);  Surgeon: Williams Che;  [facility];  Service: Ophthalmology;  Laterality: Right;  CDE=7.33   CATARACT EXTRACTION W/PHACO  12/07/2011   Procedure: CATARACT  EXTRACTION PHACO AND INTRAOCULAR LENS PLACEMENT (IOC);  Surgeon: Williams Che, MD;  Location: [facility];  Service: Ophthalmology;  Laterality: Left;  CDE 3.61   COLONOSCOPY WITH PROPOFOL N/A 08/09/2014   Procedure: COLONOSCOPY WITH PROPOFOL;  Surgeon: Juanita Craver, MD;  [facility];  Service: Endoscopy;  Laterality: N/A;   CYSTOCELE REPAIR     ESOPHAGOGASTRODUODENOSCOPY (EGD) WITH PROPOFOL N/A 08/09/2014   Procedure: ESOPHAGOGASTRODUODENOSCOPY (EGD) WITH PROPOFOL;  Surgeon: Juanita Craver, MD;  [facility];  Service: Endoscopy;  Laterality: N/A;   EYE SURGERY  11/01/2012   BOTH EYES CATARACTS   INSERT / REPLACE / REMOVE PACEMAKER  10/06/10   MDT  implanted by Dr [name]   KNEE ARTHROSCOPY     both   LEFT HEART CATH AND CORONARY ANGIOGRAPHY N/A 03/24/2018   Procedure: LEFT HEART CATH AND CORONARY ANGIOGRAPHY;  Surgeon: Lorretta Harp, MD;  Location: [facility];  Service: Cardiovascular;  Laterality: N/A;   OVARY SURGERY     removal   REPAIR RECTOCELE     SIMPLE MASTECTOMY WITH AXILLARY SENTINEL NODE BIOPSY Left 01/09/2015   Procedure: Irrigation and Drainage Abcess left Axilla;  Surgeon: Jackolyn Confer, MD;  [facility];  Service: General;  Laterality: Left;   TONSILLECTOMY  AGE 80   TOTAL ABDOMINAL HYSTERECTOMY  1971    There were no vitals  filed for this visit.   Subjective Assessment - 05/05/21 1031     Subjective COVID-19 screen performed prior to patient entering clinic.  Patient reported doing better today.    Pertinent History Bilateral knee ATS, h/o LBP, PACEMAKER, latex and bee sting allergies.    Patient Stated Goals Decreased pain with ADL's    Currently in Pain? Yes    Pain Score 1     Pain Location Back    Pain Orientation Lower    Pain Descriptors / Indicators Discomfort    Pain Type Chronic pain    Pain Onset More than a month ago    Pain Frequency Intermittent    Aggravating Factors  certain movement or activity    Pain Relieving  Factors at rest                               [facility] Adult PT Treatment/Exercise - 05/05/21 0001       Lumbar Exercises: Aerobic   Nustep L3x12 min UE/LE      Lumbar Exercises: Standing   Other Standing Lumbar Exercises seated hip abd with bracing and red band x30      Lumbar Exercises: Seated   Other Seated Lumbar Exercises glut set 5sec  x20, ball overhead 2x10 with bracing    Other Seated Lumbar Exercises latt pull with yellow band 2x10 seated      Manual Therapy   Manual Therapy Soft tissue mobilization    Soft tissue mobilization manual STW to patient's bilateral lower lumbar and upper gluteal musculature to reduce tone and pain.                    PT Education - 05/05/21 1114     Education Details HEP addition    Person(s) Educated Patient    Methods Explanation;Demonstration;Handout    Comprehension Verbalized understanding;Returned demonstration                 PT Long Term Goals - 05/05/21 1046       PT LONG TERM GOAL #5   Title Decrease right LBP-level to no higher than a 3/10 when performing ADL's.    Baseline 1/10 pain consistant with ADL's and activity 05/05/21    Time 6    Period Weeks    Status Achieved                   Plan - 05/05/21 1047     Clinical Impression Statement Patient met goal today and has 1/10 pain consistantly. Patient able to perfrom ADL's with greater ease. Today patient will be put on hold pending DC, going to MD for F/U in two weeks.    Personal Factors and Comorbidities Comorbidity 1;Other;Comorbidity 2    Comorbidities Bilateral knee ATS, h/o LBP, PACEMAKER, latex and bee sting allergies.    Examination-Activity Limitations Other    Examination-Participation Restrictions Other    Stability/Clinical Decision Making Stable/Uncomplicated    Rehab Potential Good    PT Frequency 2x / week    PT Duration 6 weeks    PT Treatment/Interventions ADLs/Self Care Home Management;Traction;Moist  Heat;Therapeutic activities;Therapeutic exercise;Manual techniques;Patient/family education;Passive range of motion    PT Next Visit Plan on hold pending MD recommendation    Consulted and Agree with Plan of Care Patient             Patient will benefit from skilled therapeutic intervention in order to improve the  following deficits and impairments:  Pain, Decreased activity tolerance, Decreased range of motion  Visit Diagnosis: Chronic right-sided low back pain without sciatica     Problem List Patient Active Problem List   Diagnosis Date Noted   Acute on chronic diastolic (congestive) heart failure (HCC) 05/06/2020   Acute on chronic diastolic congestive heart failure ([place]) 05/05/2020   Hypomagnesemia 05/05/2020   CKD (chronic kidney disease), stage III ([place]) 05/05/2020   Persistent atrial fibrillation ([place]) 02/20/2020   Secondary hypercoagulable state ([place]) 02/20/2020   Acute and chronic respiratory failure (acute-on-chronic) ([place]) 08/30/2019   COPD exacerbation ([place]) 08/29/2019   Chest pain 07/18/2018   Sepsis due to urinary tract infection ([place]) 06/30/2018   AKI (acute kidney injury) ([place]) 06/30/2018   Sepsis, unspecified organism ([place]) 06/30/2018   Normocytic anemia 06/30/2018   Coronary artery disease due to lipid rich plaque    Unstable angina ([place]) 03/23/2018   Acute respiratory distress 09/26/2017   Acute exacerbation of chronic obstructive pulmonary disease (COPD) ([place]) 09/26/2017   Dependence on nocturnal oxygen therapy 09/26/2017   Type 2 diabetes mellitus ([place]) 09/26/2017   Acute on chronic diastolic CHF (congestive heart failure) ([place]) 09/26/2017   Cardiac pacemaker 11/17/2016   Eczema 11/17/2016   Major depression, recurrent, chronic ([place]) 09/17/2016   Axillary abscess 07/22/2016   Chronic left shoulder pain 10/01/2015   Xerosis cutis 10/01/2015   Paroxysmal atrial fibrillation (HCC) 05/24/2014   PAF (paroxysmal atrial fibrillation) ([place]) 05/24/2014    NAFLD (nonalcoholic fatty liver disease) 03/29/2014   Renal cyst 03/29/2014   Elevated TSH 03/06/2014   Type 2 diabetes mellitus without complication, without long-term current use of insulin ([place]) 03/06/2014   Peripheral neuropathy 04/27/2013   Vitamin B12 deficiency 04/27/2013   Abnormality of gait 03/29/2013   Diabetic neuropathy ([place]) 03/29/2013   OSA (obstructive sleep apnea) 08/26/2012   Chronic asthmatic bronchitis ([place]) 08/26/2012   Preop cardiovascular exam 08/15/2012   Nocturnal hypoxia 04/08/2012   Seborrheic dermatitis 10/21/2011   IBS (irritable bowel syndrome) 05/27/2011   Esophageal reflux 03/27/2011   Mixed hyperlipidemia 03/27/2011   Chronic obstructive pulmonary disease ([place]) 03/27/2011   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 [place], [place], [address] Phone: [phone]   Fax:  [phone]  Name: Brandi Hunter MRN: [id number] Date of Birth: May 17, 1941   PHYSICAL THERAPY DISCHARGE SUMMARY  Visits from Start of Care: 19.  Current functional level related to goals / functional outcomes: See above.   Remaining deficits: See goal section.   Education / Equipment: HEP.   Patient agrees to discharge. Patient goals were partially met. Patient is being discharged due to being pleased with the current functional level.    Mali Applegate MPT

## 2021-05-05 NOTE — Patient Instructions (Signed)
  Hip Bridge  Laying on your back with knees bent, contract glutes to pick up hips into a bridge position. 10-20 reps 1 xday    SINGLE KNEE TO CHEST STRETCH - SKTC  While Lying on your back, raise your leg up and hold your thigh under your knee while gently pulling it towards your chest for a gentle stretch. Lower your leg down and repeat. 20sec hold x5 reps 1xday    HIP ADDUCTION - MEDIUM EXERCISE BALL SQUEEZE - SUPINE  Lying on the floor, place an exercise ball between your knees and squeeze the ball.   Hold, relax and repeat. 10sec hold x10reps 1xday

## 2021-05-08 ENCOUNTER — Encounter: Payer: Medicare HMO | Admitting: Physical Therapy

## 2021-06-06 ENCOUNTER — Ambulatory Visit (INDEPENDENT_AMBULATORY_CARE_PROVIDER_SITE_OTHER): Payer: Medicare HMO

## 2021-06-06 DIAGNOSIS — I441 Atrioventricular block, second degree: Secondary | ICD-10-CM

## 2021-06-09 LAB — CUP PACEART REMOTE DEVICE CHECK
Battery Impedance: 2026 Ohm
Battery Remaining Longevity: 26 mo
Battery Voltage: 2.73 V
Brady Statistic AP VP Percent: 31 %
Brady Statistic AP VS Percent: 1 %
Brady Statistic AS VP Percent: 13 %
Brady Statistic AS VS Percent: 55 %
Date Time Interrogation Session: 20220723095711
Implantable Lead Implant Date: 20111121
Implantable Lead Implant Date: 20111121
Implantable Lead Location: 753859
Implantable Lead Location: 753860
Implantable Lead Model: 4469
Implantable Lead Model: 4470
Implantable Lead Serial Number: 548226
Implantable Lead Serial Number: 687643
Implantable Pulse Generator Implant Date: 20111121
Lead Channel Impedance Value: 450 Ohm
Lead Channel Impedance Value: 498 Ohm
Lead Channel Pacing Threshold Amplitude: 0.625 V
Lead Channel Pacing Threshold Amplitude: 0.625 V
Lead Channel Pacing Threshold Pulse Width: 0.4 ms
Lead Channel Pacing Threshold Pulse Width: 0.4 ms
Lead Channel Setting Pacing Amplitude: 2.5 V
Lead Channel Setting Pacing Amplitude: 2.75 V
Lead Channel Setting Pacing Pulse Width: 0.4 ms
Lead Channel Setting Sensing Sensitivity: 5.6 mV

## 2021-07-01 NOTE — Progress Notes (Signed)
Remote pacemaker transmission.   

## 2021-07-09 NOTE — Progress Notes (Signed)
Cardiology Office Note  Date: 07/10/2021   ID: Brandi Hunter, DOB February 27, 1941, MRN [id number]  PCP:  [name], FNP  Cardiologist:  Rozann Lesches, MD Electrophysiologist:  Thompson Grayer, MD   Chief Complaint  Patient presents with   Cardiac follow-up    History of Present Illness: Brandi Hunter is a 80 y.o. female last seen in February.  She is here today with her son for a follow-up visit.  From a cardiac perspective she reports no palpitations or chest pain.  She states that she has been compliant with her regular medications, no bleeding problems on Eliquis.  She did have COVID-19 in the interim, still recovering.  Mostly URI symptoms, brain fog, loss of appetite.  Her weight is down a few pounds.  She has a Medtronic pacemaker followed by Dr. [name].  Device interrogation recently showed normal function with persistent atrial fibrillation.  I personally reviewed her ECG today which shows a ventricular paced rhythm.  We went over her medications, no changes in cardiac regimen.  She will have lab work in a month with PCP.  Past Medical History:  Diagnosis Date   Asthma    CAD (coronary artery disease)    a. catheterization in 03/2018 showing 90% ostial OM stenosis with medical management recommended at that time   Chronic anticoagulation    COPD (chronic obstructive pulmonary disease) (HCC)    DJD (degenerative joint disease)    Essential hypertension    GERD (gastroesophageal reflux disease)    History of home oxygen therapy    Hyperlipidemia    Mobitz (type) II atrioventricular block    Obesity    Pacemaker    Implanted by Dr [name] (MDT) 10/06/10   PAF (paroxysmal atrial fibrillation) ([place])    Pancreatitis 2010 OR 2011   Sleep apnea    CPAP   Type 2 diabetes mellitus ([place])     Past Surgical History:  Procedure Laterality Date   CARDIOVERSION N/A 08/18/2019   Procedure: CARDIOVERSION;  Surgeon: Lelon Perla, MD;  Location: [facility];  Service:  Cardiovascular;  Laterality: N/A;   CATARACT EXTRACTION W/PHACO  11/02/2011   Procedure: CATARACT EXTRACTION PHACO AND INTRAOCULAR LENS PLACEMENT ([place]);  Surgeon: Williams Che;  [facility];  Service: Ophthalmology;  Laterality: Right;  CDE=7.33   CATARACT EXTRACTION W/PHACO  12/07/2011   Procedure: CATARACT EXTRACTION PHACO AND INTRAOCULAR LENS PLACEMENT (IOC);  Surgeon: Williams Che, MD;  Location: [facility];  Service: Ophthalmology;  Laterality: Left;  CDE 3.61   COLONOSCOPY WITH PROPOFOL N/A 08/09/2014   Procedure: COLONOSCOPY WITH PROPOFOL;  Surgeon: Juanita Craver, MD;  [facility];  Service: Endoscopy;  Laterality: N/A;   CYSTOCELE REPAIR     ESOPHAGOGASTRODUODENOSCOPY (EGD) WITH PROPOFOL N/A 08/09/2014   Procedure: ESOPHAGOGASTRODUODENOSCOPY (EGD) WITH PROPOFOL;  Surgeon: Juanita Craver, MD;  [facility];  Service: Endoscopy;  Laterality: N/A;   EYE SURGERY  11/01/2012   BOTH EYES CATARACTS   INSERT / REPLACE / REMOVE PACEMAKER  10/06/10   MDT  implanted by Dr [name]   KNEE ARTHROSCOPY     both   LEFT HEART CATH AND CORONARY ANGIOGRAPHY N/A 03/24/2018   Procedure: LEFT HEART CATH AND CORONARY ANGIOGRAPHY;  Surgeon: Lorretta Harp, MD;  Location: [facility];  Service: Cardiovascular;  Laterality: N/A;   OVARY SURGERY     removal   REPAIR RECTOCELE     SIMPLE MASTECTOMY WITH AXILLARY SENTINEL NODE BIOPSY Left 01/09/2015  Procedure: Irrigation and Drainage Abcess left Axilla;  Surgeon: Jackolyn Confer, MD;  [facility];  Service: General;  Laterality: Left;   TONSILLECTOMY  AGE 65   TOTAL ABDOMINAL HYSTERECTOMY  1971    Current Outpatient Medications  Medication Sig Dispense Refill   albuterol (VENTOLIN HFA) 108 (90 Base) MCG/ACT inhaler Inhale 2 puffs into the lungs every 4 (four) hours as needed for wheezing.      atorvastatin (LIPITOR) 40 MG tablet Take 40 mg by mouth at bedtime.      budesonide-formoterol (SYMBICORT) 160-4.5 MCG/ACT inhaler  Inhale 2 puffs into the lungs 2 (two) times daily.     diclofenac Sodium (VOLTAREN) 1 % GEL Apply 2 g topically 2 (two) times daily as needed (for pain).      diltiazem (CARDIZEM CD) 120 MG 24 hr capsule Take 1 capsule by mouth once daily 90 capsule 1   DULoxetine (CYMBALTA) 30 MG capsule Take 30 mg by mouth at bedtime.      ELIQUIS 5 MG TABS tablet Take 1 tablet by mouth twice daily 180 tablet 3   famotidine (PEPCID) 20 MG tablet Take 20 mg by mouth daily.     fenofibrate 160 MG tablet Take 160 mg by mouth every evening.     furosemide (LASIX) 20 MG tablet Take 1 tablet by mouth once daily 90 tablet 1   gabapentin (NEURONTIN) 100 MG capsule Take 100 mg by mouth at bedtime.     ipratropium-albuterol (DUONEB) 0.5-2.5 (3) MG/3ML SOLN Take 3 mLs by nebulization 4 (four) times daily. 360 mL 1   isosorbide mononitrate (IMDUR) 30 MG 24 hr tablet Take 1 tablet by mouth twice daily 60 tablet 0   metFORMIN (GLUCOPHAGE) 1000 MG tablet Take 500 mg by mouth 2 (two) times daily.      metoprolol succinate (TOPROL-XL) 100 MG 24 hr tablet Take 1 tablet by mouth once daily 90 tablet 3   mupirocin ointment (BACTROBAN) 2 % Apply to affected area 3 times daily     nitroGLYCERIN (NITROSTAT) 0.4 MG SL tablet DISSOLVE ONE TABLET UNDER THE TONGUE EVERY 5 MINUTES AS NEEDED FOR CHEST PAIN.  DO NOT EXCEED A TOTAL OF 3 DOSES IN 15 MINUTES 25 tablet 3   OXYGEN Inhale 2 L into the lungs continuous.     potassium chloride SA (KLOR-CON) 20 MEQ tablet Take 20 mEq by mouth 2 (two) times daily.      Probiotic Product (ALIGN PO) Take 1 capsule by mouth daily.     spironolactone (ALDACTONE) 25 MG tablet Take 1/2 (one-half) tablet by mouth once daily 45 tablet 1   No current facility-administered medications for this visit.   Allergies:  Bee venom and Latex   ROS: No orthopnea or PND.  Physical Exam: VS:  BP (!) 144/86   Pulse 99   Ht '5\' 6"'$  (1.676 m)   Wt 204 lb 12.8 oz (92.9 kg)   SpO2 98%   BMI 33.06 kg/m , BMI Body  mass index is 33.06 kg/m.  Wt Readings from Last 3 Encounters:  07/10/21 204 lb 12.8 oz (92.9 kg)  12/25/20 207 lb 6.4 oz (94.1 kg)  12/20/20 211 lb (95.7 kg)    General: Patient appears comfortable at rest.  Wearing oxygen via nasal cannula. HEENT: Conjunctiva and lids normal, wearing a mask. Neck: Supple, no elevated JVP or carotid bruits, no thyromegaly. Lungs: Decreased breath sounds without wheezing, nonlabored breathing at rest. Cardiac: Regular rate and rhythm, no S3, 1/6 systolic murmur. Extremities:  No pitting edema.  ECG:  An ECG dated 06/20/2020 was personally reviewed today and demonstrated:  Ventricular paced rhythm with underlying atrial fibrillation.  Recent Labwork:  05/05/2020: ALT 31; AST 39; B Natriuretic Peptide 1,222.0; Magnesium 1.4 05/07/2020: Hemoglobin 9.3; Platelets 352 06/20/2020: BUN 24; Creatinine, Ser 1.45; Potassium 4.6; Sodium 138  October 2021: Cholesterol 130, triglycerides 121, HDL 35, LDL 73, BUN 35, creatinine 1.64, potassium 4.7, AST 19, ALT 12, hemoglobin 11.4, platelets 276 February 2022: Hemoglobin A1c 6.2%  Other Studies Reviewed Today:  Echocardiogram 05/06/2020:  1. Left ventricular ejection fraction, by estimation, is 60 to 65%. The  left ventricle has normal function. The left ventricle has no regional  wall motion abnormalities. There is mild left ventricular hypertrophy.  Left ventricular diastolic parameters  are consistent with Grade II diastolic dysfunction (pseudonormalization).  Elevated left atrial pressure.   2. Right ventricular systolic function is normal. The right ventricular  size is normal. There is moderately elevated pulmonary artery systolic  pressure.   3. Left atrial size was severely dilated.   4. Right atrial size was severely dilated.   5. The mitral valve is normal in structure. Mild to moderate mitral valve  regurgitation. No evidence of mitral stenosis.   6. The aortic valve has an indeterminant number of cusps.  Aortic valve  regurgitation is not visualized. No aortic stenosis is present.   7. The inferior vena cava is normal in size with greater than 50%  respiratory variability, suggesting right atrial pressure of 3 mmHg.   Assessment and Plan:  1.  Persistent atrial fibrillation with CHA2DS2-VASc score of 6.  She is asymptomatic and remains on combination of Cardizem CD and Toprol-XL for heart rate control.  Continue Eliquis for stroke prophylaxis.  No spontaneous bleeding problems reported.  She will have lab work with PCP in a month.  2.  History of orthostatic hypotension, using compression stockings and clinically stable.  3.  Second-degree heart block with Medtronic pacemaker in place and followed by Dr. [name].  Medication Adjustments/Labs and Tests Ordered: Current medicines are reviewed at length with the patient today.  Concerns regarding medicines are outlined above.   Tests Ordered: Orders Placed This Encounter  Procedures   EKG 12-Lead    Medication Changes: No orders of the defined types were placed in this encounter.   Disposition:  Follow up  6 months.  Signed, Satira Sark, MD, [facility] 07/10/2021 9:50 AM    [facility] [place], [place], [place] [address] Phone: [phone]; Fax: [phone]

## 2021-07-10 ENCOUNTER — Encounter: Payer: Self-pay | Admitting: Cardiology

## 2021-07-10 ENCOUNTER — Ambulatory Visit: Payer: Medicare HMO | Admitting: Cardiology

## 2021-07-10 VITALS — BP 144/86 | HR 99 | Ht 66.0 in | Wt 204.8 lb

## 2021-07-10 DIAGNOSIS — I951 Orthostatic hypotension: Secondary | ICD-10-CM | POA: Diagnosis not present

## 2021-07-10 DIAGNOSIS — I4819 Other persistent atrial fibrillation: Secondary | ICD-10-CM | POA: Diagnosis not present

## 2021-07-10 DIAGNOSIS — I25119 Atherosclerotic heart disease of native coronary artery with unspecified angina pectoris: Secondary | ICD-10-CM

## 2021-07-10 DIAGNOSIS — I441 Atrioventricular block, second degree: Secondary | ICD-10-CM | POA: Diagnosis not present

## 2021-07-10 NOTE — Patient Instructions (Addendum)

## 2021-09-01 ENCOUNTER — Other Ambulatory Visit: Payer: Self-pay | Admitting: Cardiology

## 2021-09-05 ENCOUNTER — Ambulatory Visit (INDEPENDENT_AMBULATORY_CARE_PROVIDER_SITE_OTHER): Payer: Medicare HMO

## 2021-09-05 DIAGNOSIS — I441 Atrioventricular block, second degree: Secondary | ICD-10-CM | POA: Diagnosis not present

## 2021-09-07 LAB — CUP PACEART REMOTE DEVICE CHECK
Battery Impedance: 2008 Ohm
Battery Remaining Longevity: 26 mo
Battery Voltage: 2.73 V
Brady Statistic AP VP Percent: 34 %
Brady Statistic AP VS Percent: 1 %
Brady Statistic AS VP Percent: 14 %
Brady Statistic AS VS Percent: 50 %
Date Time Interrogation Session: 20221021120030
Implantable Lead Implant Date: 20111121
Implantable Lead Implant Date: 20111121
Implantable Lead Location: 753859
Implantable Lead Location: 753860
Implantable Lead Model: 4469
Implantable Lead Model: 4470
Implantable Lead Serial Number: 548226
Implantable Lead Serial Number: 687643
Implantable Pulse Generator Implant Date: 20111121
Lead Channel Impedance Value: 433 Ohm
Lead Channel Impedance Value: 461 Ohm
Lead Channel Pacing Threshold Amplitude: 0.625 V
Lead Channel Pacing Threshold Amplitude: 0.625 V
Lead Channel Pacing Threshold Pulse Width: 0.4 ms
Lead Channel Pacing Threshold Pulse Width: 0.4 ms
Lead Channel Setting Pacing Amplitude: 2.5 V
Lead Channel Setting Pacing Amplitude: 2.75 V
Lead Channel Setting Pacing Pulse Width: 0.4 ms
Lead Channel Setting Sensing Sensitivity: 5.6 mV

## 2021-09-15 NOTE — Progress Notes (Signed)
Remote pacemaker transmission.   

## 2021-10-14 ENCOUNTER — Other Ambulatory Visit: Payer: Self-pay | Admitting: Cardiology

## 2021-10-15 NOTE — Telephone Encounter (Signed)
Prescription refill request for Eliquis received. Indication: Atrial fib Last office visit: 07/10/21  Myles Gip MD Scr: 1.75 on 08/29/21 Age: 80 Weight: 92.9kg  Based on above findings Eliquis 5mg  twice daily is the appropriate dose.  Refill approved.

## 2021-12-05 ENCOUNTER — Ambulatory Visit (INDEPENDENT_AMBULATORY_CARE_PROVIDER_SITE_OTHER): Payer: Medicare HMO

## 2021-12-05 DIAGNOSIS — I4819 Other persistent atrial fibrillation: Secondary | ICD-10-CM

## 2021-12-05 LAB — CUP PACEART REMOTE DEVICE CHECK
Battery Impedance: 2450 Ohm
Battery Remaining Longevity: 21 mo
Battery Voltage: 2.72 V
Brady Statistic AP VP Percent: 34 %
Brady Statistic AP VS Percent: 2 %
Brady Statistic AS VP Percent: 14 %
Brady Statistic AS VS Percent: 51 %
Date Time Interrogation Session: 20230120134753
Implantable Lead Implant Date: 20111121
Implantable Lead Implant Date: 20111121
Implantable Lead Location: 753859
Implantable Lead Location: 753860
Implantable Lead Model: 4469
Implantable Lead Model: 4470
Implantable Lead Serial Number: 548226
Implantable Lead Serial Number: 687643
Implantable Pulse Generator Implant Date: 20111121
Lead Channel Impedance Value: 446 Ohm
Lead Channel Impedance Value: 506 Ohm
Lead Channel Pacing Threshold Amplitude: 0.5 V
Lead Channel Pacing Threshold Amplitude: 0.625 V
Lead Channel Pacing Threshold Pulse Width: 0.4 ms
Lead Channel Pacing Threshold Pulse Width: 0.4 ms
Lead Channel Setting Pacing Amplitude: 2.5 V
Lead Channel Setting Pacing Amplitude: 2.75 V
Lead Channel Setting Pacing Pulse Width: 0.4 ms
Lead Channel Setting Sensing Sensitivity: 5.6 mV

## 2021-12-17 NOTE — Progress Notes (Signed)
Remote pacemaker transmission.   

## 2021-12-19 ENCOUNTER — Encounter: Payer: Self-pay | Admitting: Internal Medicine

## 2021-12-19 ENCOUNTER — Other Ambulatory Visit: Payer: Self-pay

## 2021-12-19 ENCOUNTER — Ambulatory Visit (INDEPENDENT_AMBULATORY_CARE_PROVIDER_SITE_OTHER): Payer: Medicare HMO | Admitting: Internal Medicine

## 2021-12-19 VITALS — BP 128/74 | HR 93 | Ht 66.0 in | Wt 198.2 lb

## 2021-12-19 DIAGNOSIS — I25119 Atherosclerotic heart disease of native coronary artery with unspecified angina pectoris: Secondary | ICD-10-CM

## 2021-12-19 DIAGNOSIS — I441 Atrioventricular block, second degree: Secondary | ICD-10-CM | POA: Diagnosis not present

## 2021-12-19 DIAGNOSIS — I5032 Chronic diastolic (congestive) heart failure: Secondary | ICD-10-CM

## 2021-12-19 DIAGNOSIS — I4821 Permanent atrial fibrillation: Secondary | ICD-10-CM | POA: Diagnosis not present

## 2021-12-19 NOTE — Progress Notes (Signed)
PCP: [name], [place] [facility] Cardiologist: Dr Domenic Polite Primary EP:  Dr Trevor Iha Brandi Hunter is a 81 y.o. female who presents today for routine electrophysiology followup.  Since last being seen in our clinic, the patient reports doing reasonably well.  SOB is stable.  She is wearing O2 frequently.  She has some unsteadiness.  She did have a recent mechanical fall.  Today, she denies symptoms of palpitations, chest pain,  lower extremity edema, dizziness, presyncope, or syncope.  The patient is otherwise without complaint today.   Past Medical History:  Diagnosis Date   Asthma    CAD (coronary artery disease)    a. catheterization in 03/2018 showing 90% ostial OM stenosis with medical management recommended at that time   Chronic anticoagulation    COPD (chronic obstructive pulmonary disease) (HCC)    DJD (degenerative joint disease)    Essential hypertension    GERD (gastroesophageal reflux disease)    History of home oxygen therapy    Hyperlipidemia    Mobitz (type) II atrioventricular block    Obesity    Pacemaker    Implanted by Dr [name] (MDT) 10/06/10   PAF (paroxysmal atrial fibrillation) ([place])    Pancreatitis 2010 OR 2011   Sleep apnea    CPAP   Type 2 diabetes mellitus ([place])    Past Surgical History:  Procedure Laterality Date   CARDIOVERSION N/A 08/18/2019   Procedure: CARDIOVERSION;  Surgeon: Lelon Perla, MD;  Location: [place];  Service: Cardiovascular;  Laterality: N/A;   CATARACT EXTRACTION W/PHACO  11/02/2011   Procedure: CATARACT EXTRACTION PHACO AND INTRAOCULAR LENS PLACEMENT ([place]);  Surgeon: Williams Che;  [facility];  Service: Ophthalmology;  Laterality: Right;  CDE=7.33   CATARACT EXTRACTION W/PHACO  12/07/2011   Procedure: CATARACT EXTRACTION PHACO AND INTRAOCULAR LENS PLACEMENT ([facility]);  Surgeon: Williams Che, MD;  [facility]: [facility];  Service: Ophthalmology;  Laterality: Left;  CDE 3.61   COLONOSCOPY WITH PROPOFOL N/A 08/09/2014    Procedure: COLONOSCOPY WITH PROPOFOL;  Surgeon: Juanita Craver, MD;  [facility];  Service: Endoscopy;  Laterality: N/A;   CYSTOCELE REPAIR     ESOPHAGOGASTRODUODENOSCOPY (EGD) WITH PROPOFOL N/A 08/09/2014   Procedure: ESOPHAGOGASTRODUODENOSCOPY (EGD) WITH PROPOFOL;  Surgeon: Juanita Craver, MD;  [facility];  Service: Endoscopy;  Laterality: N/A;   EYE SURGERY  11/01/2012   BOTH EYES CATARACTS   INSERT / REPLACE / REMOVE PACEMAKER  10/06/10   MDT  implanted by Dr [name]   KNEE ARTHROSCOPY     both   LEFT HEART CATH AND CORONARY ANGIOGRAPHY N/A 03/24/2018   Procedure: LEFT HEART CATH AND CORONARY ANGIOGRAPHY;  Surgeon: Lorretta Harp, MD;  Location: [place] [facility];  Service: Cardiovascular;  Laterality: N/A;   OVARY SURGERY     removal   REPAIR RECTOCELE     SIMPLE MASTECTOMY WITH AXILLARY SENTINEL NODE BIOPSY Left 01/09/2015   Procedure: Irrigation and Drainage Abcess left Axilla;  Surgeon: Jackolyn Confer, MD;  [facility];  Service: General;  Laterality: Left;   TONSILLECTOMY  AGE 89   TOTAL ABDOMINAL HYSTERECTOMY  1971    ROS- all systems are reviewed and negative except as per HPI above  Current Outpatient Medications  Medication Sig Dispense Refill   albuterol (VENTOLIN HFA) 108 (90 Base) MCG/ACT inhaler Inhale 2 puffs into the lungs every 4 (four) hours as needed for wheezing.      apixaban (ELIQUIS) 5 MG TABS tablet Take 1 tablet  by mouth twice daily 180 tablet 1   atorvastatin (LIPITOR) 40 MG tablet Take 40 mg by mouth at bedtime.      budesonide-formoterol (SYMBICORT) 160-4.5 MCG/ACT inhaler Inhale 2 puffs into the lungs 2 (two) times daily.     diclofenac Sodium (VOLTAREN) 1 % GEL Apply 2 g topically 2 (two) times daily as needed (for pain).      diltiazem (CARDIZEM CD) 120 MG 24 hr capsule Take 1 capsule by mouth once daily 90 capsule 3   DULoxetine (CYMBALTA) 30 MG capsule Take 30 mg by mouth at bedtime.      famotidine (PEPCID) 20 MG tablet  Take 20 mg by mouth daily.     fenofibrate 160 MG tablet Take 160 mg by mouth every evening.     furosemide (LASIX) 20 MG tablet Take 1 tablet by mouth once daily 90 tablet 1   gabapentin (NEURONTIN) 100 MG capsule Take 100 mg by mouth at bedtime.     ipratropium-albuterol (DUONEB) 0.5-2.5 (3) MG/3ML SOLN Take 3 mLs by nebulization 4 (four) times daily. 360 mL 1   isosorbide mononitrate (IMDUR) 30 MG 24 hr tablet Take 1 tablet by mouth twice daily 180 tablet 1   metFORMIN (GLUCOPHAGE) 1000 MG tablet Take 500 mg by mouth 2 (two) times daily.      metoprolol succinate (TOPROL-XL) 100 MG 24 hr tablet Take 1 tablet by mouth once daily 90 tablet 3   nitroGLYCERIN (NITROSTAT) 0.4 MG SL tablet DISSOLVE ONE TABLET UNDER THE TONGUE EVERY 5 MINUTES AS NEEDED FOR CHEST PAIN.  DO NOT EXCEED A TOTAL OF 3 DOSES IN 15 MINUTES 25 tablet 3   OXYGEN Inhale 2 L into the lungs continuous.     Probiotic Product (ALIGN PO) Take 1 capsule by mouth daily.     spironolactone (ALDACTONE) 25 MG tablet Take 1/2 (one-half) tablet by mouth once daily 45 tablet 1   potassium chloride SA (KLOR-CON) 20 MEQ tablet Take 20 mEq by mouth 2 (two) times daily.  (Patient not taking: Reported on 12/19/2021)     No current facility-administered medications for this visit.    Physical Exam: Vitals:   12/19/21 0934  BP: 128/74  Pulse: 93  SpO2: 99%  Weight: 198 lb 3.2 oz (89.9 kg)  Height: 5\' 6"  (1.676 m)    GEN- The patient is overweight appearing, alert and oriented x 3 today.  Wearing O2 today Head- normocephalic, atraumatic Eyes-  Sclera clear, conjunctiva pink Ears- hearing intact Oropharynx- clear Lungs- Clear to ausculation bilaterally, normal work of breathing Chest- pacemaker pocket is well healed Heart- iRRR GI- soft, NT, ND, + BS Extremities- no clubbing, cyanosis, or edema  Pacemaker interrogation- reviewed in detail today,  See PACEART report    Assessment and Plan:  1. 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 Permanent afib Chads2vasc score is 6.  She is on eliquis Rate controlled  3. HTN Stable No change required today  4. CAD No ischemic symptoms  5. Chronic diastolic dysfunction Stable No change required today  Return in a year  Thompson Grayer MD, [facility] 12/19/2021 9:57 AM

## 2021-12-19 NOTE — Patient Instructions (Signed)
Medication Instructions:  Continue all current medications.  Labwork: none  Testing/Procedures: none  Follow-Up: 1 year - Dr.  [name]   Any Other Special Instructions Will Be Listed Below (If Applicable).   If you need a refill on your cardiac medications before your next appointment, please call your pharmacy.  

## 2022-01-10 ENCOUNTER — Other Ambulatory Visit: Payer: Self-pay | Admitting: Cardiology

## 2022-01-22 ENCOUNTER — Other Ambulatory Visit: Payer: Self-pay

## 2022-01-22 ENCOUNTER — Encounter: Payer: Self-pay | Admitting: Cardiology

## 2022-01-22 ENCOUNTER — Ambulatory Visit (INDEPENDENT_AMBULATORY_CARE_PROVIDER_SITE_OTHER): Payer: Medicare HMO | Admitting: Cardiology

## 2022-01-22 VITALS — BP 102/70 | HR 82 | Ht 66.0 in | Wt 193.6 lb

## 2022-01-22 DIAGNOSIS — I4819 Other persistent atrial fibrillation: Secondary | ICD-10-CM

## 2022-01-22 DIAGNOSIS — E782 Mixed hyperlipidemia: Secondary | ICD-10-CM | POA: Diagnosis not present

## 2022-01-22 DIAGNOSIS — I25119 Atherosclerotic heart disease of native coronary artery with unspecified angina pectoris: Secondary | ICD-10-CM

## 2022-01-22 NOTE — Progress Notes (Signed)
Cardiology Office Note  Date: 01/22/2022   ID: Brandi Hunter, DOB 13-Jan-1941, MRN [id number]  PCP:  [name], FNP  Cardiologist:  Rozann Lesches, MD Electrophysiologist:  Thompson Grayer, MD   Chief Complaint  Patient presents with   Cardiac follow-up    History of Present Illness: Brandi Hunter is a 81 y.o. female last seen in August 2022.  She is here for a routine visit.  She does not report any regular sense of palpitations, no dizziness or syncope.  She continues on supplemental oxygen and is using a rolling walker.  No recent falls reported.  She has a Medtronic pacemaker in place with follow-up by Dr. [name].  Most recent device interrogation revealed normal function.  I reviewed her medications which are outlined below.  She does not report any spontaneous bleeding problems on Eliquis, I reviewed her lab work per PCP from October 2022.  Past Medical History:  Diagnosis Date   Asthma    CAD (coronary artery disease)    a. catheterization in 03/2018 showing 90% ostial OM stenosis with medical management recommended at that time   Chronic anticoagulation    COPD (chronic obstructive pulmonary disease) (HCC)    DJD (degenerative joint disease)    Essential hypertension    GERD (gastroesophageal reflux disease)    History of home oxygen therapy    Hyperlipidemia    Mobitz (type) II atrioventricular block    Obesity    Pacemaker    Implanted by Dr [name] (MDT) 10/06/10   PAF (paroxysmal atrial fibrillation) ([place])    Pancreatitis 2010 OR 2011   Sleep apnea    CPAP   Type 2 diabetes mellitus ([place])     Past Surgical History:  Procedure Laterality Date   CARDIOVERSION N/A 08/18/2019   Procedure: CARDIOVERSION;  Surgeon: Lelon Perla, MD;  Location: [place];  Service: Cardiovascular;  Laterality: N/A;   CATARACT EXTRACTION W/PHACO  11/02/2011   Procedure: CATARACT EXTRACTION PHACO AND INTRAOCULAR LENS PLACEMENT ([place]);  Surgeon: Williams Che;  [facility];  Service: Ophthalmology;  Laterality: Right;  CDE=7.33   CATARACT EXTRACTION W/PHACO  12/07/2011   Procedure: CATARACT EXTRACTION PHACO AND INTRAOCULAR LENS PLACEMENT ([facility]);  Surgeon: Williams Che, MD;  [facility];  Service: Ophthalmology;  Laterality: Left;  CDE 3.61   COLONOSCOPY WITH PROPOFOL N/A 08/09/2014   Procedure: COLONOSCOPY WITH PROPOFOL;  Surgeon: Juanita Craver, MD;  [facility];  Service: Endoscopy;  Laterality: N/A;   CYSTOCELE REPAIR     ESOPHAGOGASTRODUODENOSCOPY (EGD) WITH PROPOFOL N/A 08/09/2014   Procedure: ESOPHAGOGASTRODUODENOSCOPY (EGD) WITH PROPOFOL;  Surgeon: Juanita Craver, MD;  [facility];  Service: Endoscopy;  Laterality: N/A;   EYE SURGERY  11/01/2012   BOTH EYES CATARACTS   INSERT / REPLACE / REMOVE PACEMAKER  10/06/10   MDT  implanted by Dr [name]   KNEE ARTHROSCOPY     both   LEFT HEART CATH AND CORONARY ANGIOGRAPHY N/A 03/24/2018   Procedure: LEFT HEART CATH AND CORONARY ANGIOGRAPHY;  Surgeon: Lorretta Harp, MD;  Location: [place] [facility];  Service: Cardiovascular;  Laterality: N/A;   OVARY SURGERY     removal   REPAIR RECTOCELE     SIMPLE MASTECTOMY WITH AXILLARY SENTINEL NODE BIOPSY Left 01/09/2015   Procedure: Irrigation and Drainage Abcess left Axilla;  Surgeon: Jackolyn Confer, MD;  [facility];  Service: General;  Laterality: Left;   TONSILLECTOMY  AGE 55   TOTAL ABDOMINAL HYSTERECTOMY  1971    Current Outpatient Medications  Medication Sig Dispense Refill   acetaminophen (TYLENOL) 650 MG CR tablet Take 650 mg by mouth every 8 (eight) hours as needed for pain.     albuterol (VENTOLIN HFA) 108 (90 Base) MCG/ACT inhaler Inhale 2 puffs into the lungs every 4 (four) hours as needed for wheezing.      apixaban (ELIQUIS) 5 MG TABS tablet Take 1 tablet by mouth twice daily 180 tablet 1   atorvastatin (LIPITOR) 40 MG tablet Take 40 mg by mouth at bedtime.      budesonide-formoterol (SYMBICORT) 160-4.5 MCG/ACT  inhaler Inhale 2 puffs into the lungs 2 (two) times daily.     diclofenac Sodium (VOLTAREN) 1 % GEL Apply 2 g topically 2 (two) times daily as needed (for pain).      diltiazem (CARDIZEM CD) 120 MG 24 hr capsule Take 1 capsule by mouth once daily 90 capsule 3   DULoxetine (CYMBALTA) 30 MG capsule Take 30 mg by mouth at bedtime.      famotidine (PEPCID) 20 MG tablet Take 20 mg by mouth daily.     fenofibrate 160 MG tablet Take 160 mg by mouth every evening.     furosemide (LASIX) 20 MG tablet Take 1 tablet by mouth once daily 90 tablet 0   gabapentin (NEURONTIN) 100 MG capsule Take 200 mg by mouth at bedtime.     ipratropium-albuterol (DUONEB) 0.5-2.5 (3) MG/3ML SOLN Take 3 mLs by nebulization 4 (four) times daily. 360 mL 1   isosorbide mononitrate (IMDUR) 30 MG 24 hr tablet Take 1 tablet by mouth twice daily 180 tablet 1   metFORMIN (GLUCOPHAGE) 1000 MG tablet Take 500 mg by mouth 2 (two) times daily.      metoprolol succinate (TOPROL-XL) 100 MG 24 hr tablet Take 1 tablet by mouth once daily 90 tablet 3   nitroGLYCERIN (NITROSTAT) 0.4 MG SL tablet DISSOLVE ONE TABLET UNDER THE TONGUE EVERY 5 MINUTES AS NEEDED FOR CHEST PAIN.  DO NOT EXCEED A TOTAL OF 3 DOSES IN 15 MINUTES 25 tablet 3   OXYGEN Inhale 2 L into the lungs continuous.     Probiotic Product (ALIGN PO) Take 1 capsule by mouth daily.     spironolactone (ALDACTONE) 25 MG tablet Take 1/2 (one-half) tablet by mouth once daily 45 tablet 1   No current facility-administered medications for this visit.   Allergies:  Bee venom and Latex   ROS: No orthopnea or PND.  Physical Exam: VS:  BP 102/70    Pulse 82    Ht '5\' 6"'$  (1.676 m)    Wt 193 lb 9.6 oz (87.8 kg)    SpO2 98%    BMI 31.25 kg/m , BMI Body mass index is 31.25 kg/m.  Wt Readings from Last 3 Encounters:  01/22/22 193 lb 9.6 oz (87.8 kg)  12/19/21 198 lb 3.2 oz (89.9 kg)  07/10/21 204 lb 12.8 oz (92.9 kg)    General: Patient appears comfortable at rest.  Wearing oxygen via  nasal cannula. HEENT: Conjunctiva and lids normal, wearing a mask. Neck: Supple, no elevated JVP or carotid bruits, no thyromegaly. Lungs: Decreased breath sound, nonlabored breathing at rest. Cardiac: Regular rate and rhythm, no S3, 1/6 systolic murmur, no pericardial rub. Extremities: No pitting edema.  ECG:  An ECG dated 07/10/2021 was personally reviewed today and demonstrated:  Ventricular paced rhythm.  Recent Labwork:  October 2022: Hemoglobin 11.1, platelets 306, BUN 30, creatinine 1.75, potassium 4.3, AST 21, ALT 13,  hemoglobin A1c 5.9%, cholesterol 133, triglycerides 136, HDL 43, LDL 66  Other Studies Reviewed Today:  Echocardiogram 05/06/2020:  1. Left ventricular ejection fraction, by estimation, is 60 to 65%. The  left ventricle has normal function. The left ventricle has no regional  wall motion abnormalities. There is mild left ventricular hypertrophy.  Left ventricular diastolic parameters  are consistent with Grade II diastolic dysfunction (pseudonormalization).  Elevated left atrial pressure.   2. Right ventricular systolic function is normal. The right ventricular  size is normal. There is moderately elevated pulmonary artery systolic  pressure.   3. Left atrial size was severely dilated.   4. Right atrial size was severely dilated.   5. The mitral valve is normal in structure. Mild to moderate mitral valve  regurgitation. No evidence of mitral stenosis.   6. The aortic valve has an indeterminant number of cusps. Aortic valve  regurgitation is not visualized. No aortic stenosis is present.   7. The inferior vena cava is normal in size with greater than 50%  respiratory variability, suggesting right atrial pressure of 3 mmHg.   Assessment and Plan:  1.  Persistent atrial fibrillation with CHA2DS2-VASc score of 6.  She is doing well without active sense of palpitations on combination of Cardizem CD and Toprol-XL.  She remains on Eliquis for stroke prophylaxis, I  reviewed her lab work from October 2022.  No reported spontaneous bleeding problems.  2.  Secondary heart block with Medtronic pacemaker in place.  She continues to follow with Dr. [name].  Device function was normal at most recent interrogation.  3.  Branch vessel CAD by cardiac catheterization in 2019.  No progressive angina symptoms on medical therapy.  Not on aspirin given use of Eliquis.  Continue Lipitor.  Last LDL was 66.  Medication Adjustments/Labs and Tests Ordered: Current medicines are reviewed at length with the patient today.  Concerns regarding medicines are outlined above.   Tests Ordered: No orders of the defined types were placed in this encounter.   Medication Changes: No orders of the defined types were placed in this encounter.   Disposition:  Follow up  6 months.  Signed, Satira Sark, MD, [facility] 01/22/2022 4:54 PM    [facility] at [facility] [address], [place],  [address] Phone: [phone]; Fax: [phone]

## 2022-01-22 NOTE — Patient Instructions (Signed)
Medication Instructions:  Your physician recommends that you continue on your current medications as directed. Please refer to the Current Medication list given to you today.   Labwork: None today  Testing/Procedures: None today  Follow-Up: 6 months  Any Other Special Instructions Will Be Listed Below (If Applicable).  If you need a refill on your cardiac medications before your next appointment, please call your pharmacy.  

## 2022-01-27 ENCOUNTER — Other Ambulatory Visit: Payer: Self-pay | Admitting: Nurse Practitioner

## 2022-01-27 DIAGNOSIS — M8580 Other specified disorders of bone density and structure, unspecified site: Secondary | ICD-10-CM

## 2022-01-28 ENCOUNTER — Other Ambulatory Visit: Payer: Self-pay | Admitting: Nurse Practitioner

## 2022-01-28 DIAGNOSIS — Z1231 Encounter for screening mammogram for malignant neoplasm of breast: Secondary | ICD-10-CM

## 2022-03-06 ENCOUNTER — Other Ambulatory Visit: Payer: Self-pay | Admitting: Cardiology

## 2022-03-06 ENCOUNTER — Telehealth: Payer: Self-pay | Admitting: Cardiology

## 2022-03-06 ENCOUNTER — Ambulatory Visit (INDEPENDENT_AMBULATORY_CARE_PROVIDER_SITE_OTHER): Payer: Medicare HMO

## 2022-03-06 DIAGNOSIS — I441 Atrioventricular block, second degree: Secondary | ICD-10-CM | POA: Diagnosis not present

## 2022-03-06 LAB — CUP PACEART REMOTE DEVICE CHECK
Battery Impedance: 2824 Ohm
Battery Remaining Longevity: 26 mo
Battery Voltage: 2.72 V
Brady Statistic RV Percent Paced: 86 %
Date Time Interrogation Session: 20230421093309
Implantable Lead Implant Date: 20111121
Implantable Lead Implant Date: 20111121
Implantable Lead Location: 753859
Implantable Lead Location: 753860
Implantable Lead Model: 4469
Implantable Lead Model: 4470
Implantable Lead Serial Number: 548226
Implantable Lead Serial Number: 687643
Implantable Pulse Generator Implant Date: 20111121
Lead Channel Impedance Value: 499 Ohm
Lead Channel Impedance Value: 67 Ohm
Lead Channel Pacing Threshold Amplitude: 0.625 V
Lead Channel Pacing Threshold Pulse Width: 0.4 ms
Lead Channel Setting Pacing Amplitude: 2.5 V
Lead Channel Setting Pacing Pulse Width: 0.4 ms
Lead Channel Setting Sensing Sensitivity: 5.6 mV

## 2022-03-06 NOTE — Telephone Encounter (Signed)
Patient with diagnosis of afib on Eliquis for anticoagulation.   ? ?Procedure: cervical ESI ?Date of procedure: TBD ? ?CHA2DS2-VASc Score = 7  ?This indicates a 11.2% annual risk of stroke. ?The patient's score is based upon: ?CHF History: 1 ?HTN History: 1 ?Diabetes History: 1 ?Stroke History: 0 ?Vascular Disease History: 1 ?Age Score: 2 ?Gender Score: 1 ?  ?CrCl 5m/min using adjusted body weight ?Platelet count 263K ? ?Per office protocol, patient can hold Eliquis for 3 days prior to procedure. Resume anticoag as soon as safely possible after given elevated CV risk. ?

## 2022-03-06 NOTE — Telephone Encounter (Signed)
Patient was told by her PCP to let Dr. Domenic Polite know that she fell on Monday. She saw her PCP yesterday and had a CT done and it came back normal.  ?She does have a pacemaker and wanted to know if anything would have shown up on the transmission. ? ?Patient does not report any other symptoms at the time of the call.  ?

## 2022-03-06 NOTE — Telephone Encounter (Signed)
Successful telephone encounter to patient to provide reassurance that her transmission today was normal and no eposides noted that would have caused her to fall. Patient states her PCP feels patient was a little dehydrated as she was told to push fluids post resulted labwork. Patient has a call in to general cards, Dr. Domenic Polite to address diuretics per PCP request. Patient appreciative of call and information provided.  ?

## 2022-03-06 NOTE — Telephone Encounter (Signed)
? ?  Pre-operative Risk Assessment  ?  ?Patient Name: Brandi Hunter  ?DOB: 03-11-1941 ?MRN: [id number]  ? ?  ? ?Request for Surgical Clearance   ? ?Procedure:   cervical epidural steroid injection ? ?Date of Surgery:  Clearance TBD                              ?   ?Surgeon: Mack Guise' Toole  ?Surgeon's Group or Practice Name:  [facility] in [place]  ?Phone number:  [phone] ?Fax number:  [phone] ?  ?Type of Clearance Requested:   ?Pharmacy Hold eliquis for 3 days  ?  ?Type of Anesthesia:  Not Indicated ?  ?Additional requests/questions:   ? ?Signed, ?Malanie C Hildebrandt   ?03/06/2022, 11:20 AM  ? ?

## 2022-03-09 NOTE — Telephone Encounter (Signed)
? ?  Name: Brandi Hunter  ?DOB: 1941-03-20  ?MRN: [id number]  ? ?Primary Cardiologist: Rozann Lesches, MD ? ?Chart reviewed as part of pre-operative protocol coverage.  ? ?We have been asked for guidance to hold eliquis for cevical ESI. Per our clinical pharmacist: ? ?Patient with diagnosis of afib on Eliquis for anticoagulation.   ?  ?Procedure: cervical ESI ?Date of procedure: TBD ?  ?CHA2DS2-VASc Score = 7  ?This indicates a 11.2% annual risk of stroke. ?The patient's score is based upon: ?CHF History: 1 ?HTN History: 1 ?Diabetes History: 1 ?Stroke History: 0 ?Vascular Disease History: 1 ?Age Score: 2 ?Gender Score: 1 ?  ?CrCl 58m/min using adjusted body weight ?Platelet count 263K ?  ?Per office protocol, patient can hold Eliquis for 3 days prior to procedure. Resume anticoag as soon as safely possible after given elevated CV risk. ? ? ? ?I will route this recommendation to the requesting party via Epic fax function and remove from pre-op pool. Please call with questions. ? ?ALedora Bottcher PA ?03/09/2022, 8:17 AM ? ?

## 2022-03-24 NOTE — Progress Notes (Signed)
Remote pacemaker transmission.   

## 2022-03-25 ENCOUNTER — Encounter: Payer: Self-pay | Admitting: Pulmonary Disease

## 2022-03-25 ENCOUNTER — Ambulatory Visit: Payer: Medicare HMO | Admitting: Pulmonary Disease

## 2022-03-25 VITALS — BP 124/76 | HR 86 | Temp 97.7°F | Ht 66.0 in | Wt 194.4 lb

## 2022-03-25 DIAGNOSIS — J439 Emphysema, unspecified: Secondary | ICD-10-CM

## 2022-03-25 DIAGNOSIS — J449 Chronic obstructive pulmonary disease, unspecified: Secondary | ICD-10-CM | POA: Diagnosis not present

## 2022-03-25 DIAGNOSIS — J9611 Chronic respiratory failure with hypoxia: Secondary | ICD-10-CM

## 2022-03-25 DIAGNOSIS — J9610 Chronic respiratory failure, unspecified whether with hypoxia or hypercapnia: Secondary | ICD-10-CM | POA: Diagnosis not present

## 2022-03-25 DIAGNOSIS — J432 Centrilobular emphysema: Secondary | ICD-10-CM

## 2022-03-25 DIAGNOSIS — E662 Morbid (severe) obesity with alveolar hypoventilation: Secondary | ICD-10-CM | POA: Diagnosis not present

## 2022-03-25 DIAGNOSIS — I2510 Atherosclerotic heart disease of native coronary artery without angina pectoris: Secondary | ICD-10-CM

## 2022-03-25 DIAGNOSIS — Z9989 Dependence on other enabling machines and devices: Secondary | ICD-10-CM

## 2022-03-25 DIAGNOSIS — G4733 Obstructive sleep apnea (adult) (pediatric): Secondary | ICD-10-CM

## 2022-03-25 DIAGNOSIS — I480 Paroxysmal atrial fibrillation: Secondary | ICD-10-CM

## 2022-03-25 DIAGNOSIS — I38 Endocarditis, valve unspecified: Secondary | ICD-10-CM

## 2022-03-25 DIAGNOSIS — J45909 Unspecified asthma, uncomplicated: Secondary | ICD-10-CM | POA: Diagnosis not present

## 2022-03-25 NOTE — Progress Notes (Signed)
? ?[facility] ? ?Chief Complaint  ?Patient presents with  ? Consult  ?  COB when in a-fib  ? ? ?Past Surgical History:  ?She  has a past surgical history that includes Insert / replace / remove pacemaker (10/06/10); Total abdominal hysterectomy (1971); Cystocele repair; Ovary surgery; Repair rectocele; Knee arthroscopy; Cataract extraction w/PHACO (11/02/2011); Cataract extraction w/PHACO (12/07/2011); Eye surgery (11/01/2012); Tonsillectomy (AGE 13); Colonoscopy with propofol (N/A, 08/09/2014); Esophagogastroduodenoscopy (egd) with propofol (N/A, 08/09/2014); Simple mastectomy with axillary sentinel node biopsy (Left, 01/09/2015); LEFT HEART CATH AND CORONARY ANGIOGRAPHY (N/A, 03/24/2018); and Cardioversion (N/A, 08/18/2019). ? ?Past Medical History:  ?CAD, DJD, HTN, GERD, HLD, Mobitz II s/p PM, PAF, Pancreatitis 2010, DM type 2 ? ?Constitutional:  ?BP 124/76 (BP Location: Right Arm, Patient Position: Sitting, Cuff Size: Normal)   Pulse 86   Temp 97.7 ?F (36.5 ?C) (Oral)   Ht '5\' 6"'$  (1.676 m)   Wt 194 lb 6.4 oz (88.2 kg)   SpO2 96%   BMI 31.38 kg/m?  ? ?Brief Summary:  ?Brandi Hunter is a 81 y.o. female former smoker with COPD/asthma, OSA, and OHS. ?  ? ? ? ?Subjective:  ? ?I last saw her in June 2019.  She has lost about 60 lbs since then. ? ?She is using 2 liters oxygen 24/7. ? ?She uses CPAP nightly.  Her machine motor is at end of life.  No issues with mask fit. ? ?She uses symbicort and this helps.  Not needing albuterol much. ? ?Activity limited by joint pains.  She uses a can or a walker. ? ?She feels more fatigued when she is in a fib. ? ?Physical Exam:  ? ?Appearance - well kempt, sitting in wheelchair, wearing oxygen ? ?ENMT - no sinus tenderness, no oral exudate, no LAN, Mallampati 3 airway, no stridor ? ?Respiratory - equal breath sounds bilaterally, no wheezing or rales ? ?CV - s1s2 regular rate and rhythm, no murmurs ? ?Ext - no clubbing, no edema ? ?Skin - no  rashes ? ?Psych - normal mood and affect ?  ?Pulmonary testing:  ?PFT 09/27/12 >> FEV1 1.81 (79%), FEV1% 65, TLC 4.64 (86%), DLCO 77%, no BD ? ?Chest Imaging:  ?CT chest 05/23/14 >> 5 mm nodule Rt lower lung stable since 2008 ?LDCT chest 06/28/20 >> atherosclerosis, mild centrilobular emphysema, scattered scarring, calcified granulomas, stable nodules up to 5.8 mm ? ?Sleep Tests:  ?PSG 11/18/08 >> AHI 9 ?Auto CPAP 12/25/21 to 03/24/22 >> used on 90 of 90 nights with average 10 hrs 9 min.  Average AHI 0.7 with median CPAP 6 and 95 th percentile CPAP 9 cm H2O ? ?Cardiac Tests:  ?Echo 05/06/20 >> EF 60 to 65%, mild LVH, grade 2 DD, mod elevation in PASP, severe LA/RA dilation, mild/mod MR ? ?Social History:  ?She  reports that she quit smoking about 4 years ago. Her smoking use included cigarettes. She quit smokeless tobacco use about 10 years ago. She reports that she does not currently use alcohol after a past usage of about 1.0 standard drink per week. She reports that she does not use drugs. ? ?Family History:  ?Her family history includes Congestive Heart Failure in her father and mother; Diabetes in her brother; Osteoarthritis in her sister; Prostate cancer in her brother. ?  ? ? ?Assessment/Plan:  ? ?COPD with asthma and emphysema. ?- continue symbicort ?- prn albuterol and duoneb ?  ?Obstructive sleep apnea. ?- she is compliant with CPAP and reports  benefit ?- she uses [employer] for her DME ?- her current CPAP is more than 79 yrs old ?- will arrange for new [employer] auto CPAP at 5 to 15 cm H2O ?  ?Chronic respiratory failure with obesity hypoventilation syndrome. ?- she is using 2 liters oxygen 24/7 ?- gets supplies through [facility] ? ?Paroxysmal atrial fibrillation, Mobitz II s/p PM, Coronary artery disease, Valvular heart disease. ?- followed by Dr. Johnny Bridge with cardiology ? ?Time Spent Involved in Patient Care on Day of Examination:  ?36 minutes ? ?Follow up:  ? ?Patient Instructions  ?Will arrange for new auto CPAP  machine ? ?Follow up in [place] office in 4 months ? ?Medication List:  ? ?Allergies as of 03/25/2022   ? ?   Reactions  ? Bee Venom Swelling  ? Latex Swelling  ? LATEX CATHETERS  ? ?  ? ?  ?Medication List  ?  ? ?  ? Accurate as of Mar 25, 2022  2:53 PM. If you have any questions, ask your nurse or doctor.  ?  ?  ? ?  ? ?acetaminophen 650 MG CR tablet ?Commonly known as: TYLENOL ?Take 650 mg by mouth every 8 (eight) hours as needed for pain. ?  ?albuterol 108 (90 Base) MCG/ACT inhaler ?Commonly known as: VENTOLIN HFA ?Inhale 2 puffs into the lungs every 4 (four) hours as needed for wheezing. ?  ?ALIGN PO ?Take 1 capsule by mouth daily. ?  ?atorvastatin 40 MG tablet ?Commonly known as: LIPITOR ?Take 40 mg by mouth at bedtime. ?  ?budesonide-formoterol 160-4.5 MCG/ACT inhaler ?Commonly known as: SYMBICORT ?Inhale 2 puffs into the lungs 2 (two) times daily. ?  ?diclofenac Sodium 1 % Gel ?Commonly known as: VOLTAREN ?Apply 2 g topically 2 (two) times daily as needed (for pain). ?  ?diltiazem 120 MG 24 hr capsule ?Commonly known as: CARDIZEM CD ?Take 1 capsule by mouth once daily ?  ?DULoxetine 30 MG capsule ?Commonly known as: CYMBALTA ?Take 30 mg by mouth at bedtime. ?  ?Eliquis 5 MG Tabs tablet ?Generic drug: apixaban ?Take 1 tablet by mouth twice daily ?  ?famotidine 20 MG tablet ?Commonly known as: PEPCID ?Take 20 mg by mouth daily. ?  ?fenofibrate 160 MG tablet ?Take 160 mg by mouth every evening. ?  ?furosemide 20 MG tablet ?Commonly known as: LASIX ?Take 1 tablet by mouth once daily ?  ?gabapentin 100 MG capsule ?Commonly known as: NEURONTIN ?Take 200 mg by mouth at bedtime. ?  ?ipratropium-albuterol 0.5-2.5 (3) MG/3ML Soln ?Commonly known as: DUONEB ?Take 3 mLs by nebulization 4 (four) times daily. ?  ?isosorbide mononitrate 30 MG 24 hr tablet ?Commonly known as: IMDUR ?Take 1 tablet by mouth twice daily ?  ?metFORMIN 1000 MG tablet ?Commonly known as: GLUCOPHAGE ?Take 500 mg by mouth 2 (two) times daily. ?   ?metoprolol succinate 100 MG 24 hr tablet ?Commonly known as: TOPROL-XL ?Take 1 tablet by mouth once daily ?  ?nitroGLYCERIN 0.4 MG SL tablet ?Commonly known as: NITROSTAT ?DISSOLVE ONE TABLET UNDER THE TONGUE EVERY 5 MINUTES AS NEEDED FOR CHEST PAIN.  DO NOT EXCEED A TOTAL OF 3 DOSES IN 15 MINUTES ?  ?OXYGEN ?Inhale 2 L into the lungs continuous. ?  ?spironolactone 25 MG tablet ?Commonly known as: ALDACTONE ?Take 1/2 (one-half) tablet by mouth once daily ?  ? ?  ? ? ?Signature:  ?Chesley Mires, MD ?[place] ?Pager - [phone] ?03/25/2022, 2:53 PM ?  ? ? ? ? ? ? ? ? ?

## 2022-03-25 NOTE — Patient Instructions (Signed)
Will arrange for new auto CPAP machine ? ?Follow up in [place] office in 4 months ?

## 2022-04-07 ENCOUNTER — Other Ambulatory Visit: Payer: Self-pay | Admitting: Cardiology

## 2022-04-07 NOTE — Telephone Encounter (Signed)
Prescription refill request for Eliquis received. Indication: Atrial Fib Last office visit: 01/22/22  Myles Gip MD Scr: 1.33 on 03/05/22 Age: 81 Weight: 87.8kg  Based on above findings Eliquis '5mg'$  twice daily is the appropriate dose.  Refill approved.

## 2022-05-18 ENCOUNTER — Telehealth: Payer: Self-pay | Admitting: Cardiology

## 2022-05-18 NOTE — Telephone Encounter (Signed)
   Pre-operative Risk Assessment    Patient Name: Brandi Hunter  DOB: 1941-02-11 MRN: [id number]      Request for Surgical Clearance    Procedure:   L5-S1 Interlaminar Epidural Steroid Injection  Date of Surgery:  Clearance TBD                                 Surgeon:  Dorene Ar, MD Surgeon's Group or Practice Name:  [facility] Phone number:  [phone] Fax number:  [phone]   Type of Clearance Requested:   - Pharmacy:  Hold Apixaban (Eliquis) 3 Days   Type of Anesthesia:  Not Indicated   Additional requests/questions:    Louretta Shorten   05/18/2022, 11:03 AM

## 2022-05-20 NOTE — Telephone Encounter (Signed)
   Patient Name: Brandi Hunter  DOB: 04-23-1941 MRN: [id number]  Primary Cardiologist: Rozann Lesches, MD  Clinical pharmacists have reviewed the patient's past medical history, labs, and medications as part of pre-operative protocol coverage.   The following recommendations have been made:  Patient with diagnosis of afib on Eliquis for anticoagulation.     Procedure: L5-S1 Interlaminar Epidural Steroid Injection Date of procedure: TBD   CHA2DS2-VASc Score = 7  This indicates a 11.2% annual risk of stroke. The patient's score is based upon: CHF History: 1 HTN History: 1 Diabetes History: 1 Stroke History: 0 Vascular Disease History: 1 Age Score: 2 Gender Score: 1   CrCl 46m/min using adjusted body weight due to obesity Platelet count 263K   Per office protocol, patient can hold Eliquis for 3 days prior to procedure. Please resume Eliquis as soon as possible postprocedure at the discretion of the surgeon.  I will route this recommendation to the requesting party via epic fax function and remove from pre-op pool.  Please call with any questions.   ELenna Sciara NP 05/20/2022, 1:52 PM

## 2022-05-20 NOTE — Telephone Encounter (Signed)
Patient with diagnosis of afib on Eliquis for anticoagulation.    Procedure: L5-S1 Interlaminar Epidural Steroid Injection Date of procedure: TBD  CHA2DS2-VASc Score = 7  This indicates a 11.2% annual risk of stroke. The patient's score is based upon: CHF History: 1 HTN History: 1 Diabetes History: 1 Stroke History: 0 Vascular Disease History: 1 Age Score: 2 Gender Score: 1  CrCl 71m/min using adjusted body weight due to obesity Platelet count 263K  Per office protocol, patient can hold Eliquis for 3 days prior to procedure.    **This guidance is not considered finalized until pre-operative APP has relayed final recommendations.**

## 2022-06-12 ENCOUNTER — Ambulatory Visit (INDEPENDENT_AMBULATORY_CARE_PROVIDER_SITE_OTHER): Payer: Medicare HMO

## 2022-06-12 DIAGNOSIS — I441 Atrioventricular block, second degree: Secondary | ICD-10-CM

## 2022-06-12 LAB — CUP PACEART REMOTE DEVICE CHECK
Battery Impedance: 3100 Ohm
Battery Remaining Longevity: 24 mo
Battery Voltage: 2.7 V
Brady Statistic RV Percent Paced: 87 %
Date Time Interrogation Session: 20230728104411
Implantable Lead Implant Date: 20111121
Implantable Lead Implant Date: 20111121
Implantable Lead Location: 753859
Implantable Lead Location: 753860
Implantable Lead Model: 4469
Implantable Lead Model: 4470
Implantable Lead Serial Number: 548226
Implantable Lead Serial Number: 687643
Implantable Pulse Generator Implant Date: 20111121
Lead Channel Impedance Value: 487 Ohm
Lead Channel Impedance Value: 67 Ohm
Lead Channel Pacing Threshold Amplitude: 0.5 V
Lead Channel Pacing Threshold Pulse Width: 0.4 ms
Lead Channel Setting Pacing Amplitude: 2.5 V
Lead Channel Setting Pacing Pulse Width: 0.4 ms
Lead Channel Setting Sensing Sensitivity: 4 mV

## 2022-06-26 ENCOUNTER — Encounter: Payer: Self-pay | Admitting: Pulmonary Disease

## 2022-06-26 ENCOUNTER — Ambulatory Visit: Payer: Medicare HMO | Admitting: Pulmonary Disease

## 2022-06-26 VITALS — BP 130/68 | HR 82 | Temp 97.6°F | Ht 66.0 in | Wt 193.2 lb

## 2022-06-26 DIAGNOSIS — J9611 Chronic respiratory failure with hypoxia: Secondary | ICD-10-CM | POA: Diagnosis not present

## 2022-06-26 DIAGNOSIS — Z9989 Dependence on other enabling machines and devices: Secondary | ICD-10-CM | POA: Diagnosis not present

## 2022-06-26 DIAGNOSIS — G4733 Obstructive sleep apnea (adult) (pediatric): Secondary | ICD-10-CM | POA: Diagnosis not present

## 2022-06-26 DIAGNOSIS — J432 Centrilobular emphysema: Secondary | ICD-10-CM | POA: Diagnosis not present

## 2022-06-26 DIAGNOSIS — J449 Chronic obstructive pulmonary disease, unspecified: Secondary | ICD-10-CM

## 2022-06-26 NOTE — Progress Notes (Signed)
[facility]  Chief Complaint  Patient presents with   Follow-up    CPAP working well.     Past Surgical History:  She  has a past surgical history that includes Insert / replace / remove pacemaker (10/06/10); Total abdominal hysterectomy (1971); Cystocele repair; Ovary surgery; Repair rectocele; Knee arthroscopy; Cataract extraction w/PHACO (11/02/2011); Cataract extraction w/PHACO (12/07/2011); Eye surgery (11/01/2012); Tonsillectomy (AGE 49); Colonoscopy with propofol (N/A, 08/09/2014); Esophagogastroduodenoscopy (egd) with propofol (N/A, 08/09/2014); Simple mastectomy with axillary sentinel node biopsy (Left, 01/09/2015); LEFT HEART CATH AND CORONARY ANGIOGRAPHY (N/A, 03/24/2018); and Cardioversion (N/A, 08/18/2019).  Past Medical History:  CAD, DJD, HTN, GERD, HLD, Mobitz II s/p PM, PAF, Pancreatitis 2010, DM type 2  Constitutional:  BP 130/68 (BP Location: Left Arm, Patient Position: Sitting)   Pulse 82   Temp 97.6 F (36.4 C) (Temporal)   Ht '5\' 6"'$  (1.676 m)   Wt 193 lb 3.2 oz (87.6 kg)   SpO2 97% Comment: 2LO2  BMI 31.18 kg/m   Brief Summary:  Brandi Hunter is a 81 y.o. female former smoker with COPD/asthma, OSA, and OHS.      Subjective:   She is here with her son.   Doing well with new CPAP.  Has full face mask.  Not having sinus congestion or dry mouth.  Sleeping well.  Not having cough, wheeze, or sputum.  Physical Exam:   Appearance - well kempt, wearing oxygen   ENMT - no sinus tenderness, no oral exudate, no LAN, Mallampati 3 airway, no stridor  Respiratory - equal breath sounds bilaterally, no wheezing or rales  CV - s1s2 regular rate and rhythm, no murmurs  Ext - no clubbing, no edema  Skin - no rashes  Psych - normal mood and affect    Pulmonary testing:  PFT 09/27/12 >> FEV1 1.81 (79%), FEV1% 65, TLC 4.64 (86%), DLCO 77%, no BD  Chest Imaging:  CT chest 05/23/14 >> 5 mm nodule Rt lower lung stable since  2008 LDCT chest 06/28/20 >> atherosclerosis, mild centrilobular emphysema, scattered scarring, calcified granulomas, stable nodules up to 5.8 mm  Sleep Tests:  PSG 11/18/08 >> AHI 9 Auto CPAP 05/14/22 to 06/12/22 >> used on 30 of 30 nights with average 9 hrs 45 min.  Average AHI 1 with median CPAP 6 and 95 th percentile CPAP 9 cm H2O  Cardiac Tests:  Echo 05/06/20 >> EF 60 to 65%, mild LVH, grade 2 DD, mod elevation in PASP, severe LA/RA dilation, mild/mod MR  Social History:  She  reports that she quit smoking about 4 years ago. Her smoking use included cigarettes. She quit smokeless tobacco use about 10 years ago. She reports that she does not currently use alcohol after a past usage of about 1.0 standard drink of alcohol per week. She reports that she does not use drugs.  Family History:  Her family history includes Congestive Heart Failure in her father and mother; Diabetes in her brother; Osteoarthritis in her sister; Prostate cancer in her brother.     Assessment/Plan:   COPD with asthma and emphysema. - continue symbicort 160 two puffs bid - prn albuterol HFA and duoneb   Obstructive sleep apnea. 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 - 2 liters oxygen 24/7 - gets supplies through  [facility]  Paroxysmal atrial fibrillation, Mobitz II s/p PM, Coronary artery disease, Valvular heart disease. - followed by Dr. Johnny Bridge with cardiology  Time Spent Involved in Patient Care on Day of Examination:  36 minutes  Follow up:   Patient Instructions  Follow up in 1 year  Medication List:   Allergies as of 06/26/2022       Reactions   Bee Venom Swelling   Latex Swelling   LATEX CATHETERS        Medication List        Accurate as of June 26, 2022 10:41 AM. If you have any questions, ask your  nurse or doctor.          acetaminophen 650 MG CR tablet Commonly known as: TYLENOL Take 650 mg by mouth every 8 (eight) hours as needed for pain.   albuterol 108 (90 Base) MCG/ACT inhaler Commonly known as: VENTOLIN HFA Inhale 2 puffs into the lungs every 4 (four) hours as needed for wheezing.   ALIGN PO Take 1 capsule by mouth daily.   atorvastatin 40 MG tablet Commonly known as: LIPITOR Take 40 mg by mouth at bedtime.   budesonide-formoterol 160-4.5 MCG/ACT inhaler Commonly known as: SYMBICORT Inhale 2 puffs into the lungs 2 (two) times daily.   diclofenac Sodium 1 % Gel Commonly known as: VOLTAREN Apply 2 g topically 2 (two) times daily as needed (for pain).   diltiazem 120 MG 24 hr capsule Commonly known as: CARDIZEM CD Take 1 capsule by mouth once daily   DULoxetine 30 MG capsule Commonly known as: CYMBALTA Take 30 mg by mouth at bedtime.   Eliquis 5 MG Tabs tablet Generic drug: apixaban Take 1 tablet by mouth twice daily   famotidine 20 MG tablet Commonly known as: PEPCID Take 20 mg by mouth daily.   fenofibrate 160 MG tablet Take 160 mg by mouth every evening.   furosemide 20 MG tablet Commonly known as: LASIX Take 1 tablet by mouth once daily   gabapentin 100 MG capsule Commonly known as: NEURONTIN Take 200 mg by mouth at bedtime.   ipratropium-albuterol 0.5-2.5 (3) MG/3ML Soln Commonly known as: DUONEB Take 3 mLs by nebulization 4 (four) times daily.   isosorbide mononitrate 30 MG 24 hr tablet Commonly known as: IMDUR Take 1 tablet by mouth twice daily   metFORMIN 1000 MG tablet Commonly known as: GLUCOPHAGE Take 500 mg by mouth 2 (two) times daily.   metoprolol succinate 100 MG 24 hr tablet Commonly known as: TOPROL-XL Take 1 tablet by mouth once daily   nitroGLYCERIN 0.4 MG SL tablet Commonly known as: NITROSTAT DISSOLVE ONE TABLET UNDER THE TONGUE EVERY 5 MINUTES AS NEEDED FOR CHEST PAIN.  DO NOT EXCEED A TOTAL OF 3 DOSES IN 15  MINUTES   OXYGEN Inhale 2 L into the lungs continuous.   spironolactone 25 MG tablet Commonly known as: ALDACTONE Take 1/2 (one-half) tablet by mouth once daily        Signature:  Chesley Mires, MD [place] Pager - [phone] 06/26/2022, 10:41 AM

## 2022-06-26 NOTE — Patient Instructions (Signed)
Follow up in 1 year.

## 2022-07-01 NOTE — Progress Notes (Signed)
Remote pacemaker transmission.   

## 2022-07-13 ENCOUNTER — Ambulatory Visit
Admission: RE | Admit: 2022-07-13 | Discharge: 2022-07-13 | Disposition: A | Discharge status: Home / Self Care | Payer: Medicare HMO | Source: Ambulatory Visit | Attending: Nurse Practitioner | Admitting: Nurse Practitioner

## 2022-07-13 DIAGNOSIS — Z1231 Encounter for screening mammogram for malignant neoplasm of breast: Secondary | ICD-10-CM

## 2022-07-13 DIAGNOSIS — M8580 Other specified disorders of bone density and structure, unspecified site: Secondary | ICD-10-CM

## 2022-07-16 ENCOUNTER — Other Ambulatory Visit: Payer: Self-pay | Admitting: Nurse Practitioner

## 2022-07-16 DIAGNOSIS — R928 Other abnormal and inconclusive findings on diagnostic imaging of breast: Secondary | ICD-10-CM

## 2022-07-29 ENCOUNTER — Other Ambulatory Visit: Payer: Medicare HMO

## 2022-08-02 NOTE — Progress Notes (Unsigned)
Cardiology Office Note  Date: 08/03/2022   ID: Brandi Hunter, DOB 08-06-1941, MRN [id number]  PCP:  [name], FNP  Cardiologist:  Rozann Lesches, MD Electrophysiologist:  Thompson Grayer, MD   Chief Complaint  Patient presents with   Cardiac follow-up    History of Present Illness: Brandi Hunter is a 81 y.o. female last seen in March.  She is here today with her son for a follow-up visit.  Reports trouble with intermittent orthostatic lightheadedness and recurring falls.  At times she has noticed hypotension as well.  She does not report any palpitations during these times, no chest pain.  Medtronic pacemaker in place with follow-up by EP.  Device check in July showed normal function.  I personally reviewed her ECG today which shows a ventricular paced rhythm with PVC.  We went over her medications which are outlined below.  Blood pressure low normal range today at 118/70.  We discussed addition of midodrine to try and blunt any orthostatic hypotension that might be contributing to her dizziness and falls.  Past Medical History:  Diagnosis Date   Asthma    CAD (coronary artery disease)    a. catheterization in 03/2018 showing 90% ostial OM stenosis with medical management recommended at that time   Chronic anticoagulation    COPD (chronic obstructive pulmonary disease) (HCC)    DJD (degenerative joint disease)    Essential hypertension    GERD (gastroesophageal reflux disease)    History of home oxygen therapy    Hyperlipidemia    Mobitz (type) II atrioventricular block    Obesity    Pacemaker    Implanted by Dr [name] (MDT) 10/06/10   PAF (paroxysmal atrial fibrillation) ([place])    Pancreatitis 2010 OR 2011   Sleep apnea    CPAP   Type 2 diabetes mellitus ([place])     Past Surgical History:  Procedure Laterality Date   CARDIOVERSION N/A 08/18/2019   Procedure: CARDIOVERSION;  Surgeon: Lelon Perla, MD;  Location: [facility];  Service: Cardiovascular;   Laterality: N/A;   CATARACT EXTRACTION W/PHACO  11/02/2011   Procedure: CATARACT EXTRACTION PHACO AND INTRAOCULAR LENS PLACEMENT ([place]);  Surgeon: Williams Che;  Location: [facility];  Service: Ophthalmology;  Laterality: Right;  CDE=7.33   CATARACT EXTRACTION W/PHACO  12/07/2011   Procedure: CATARACT EXTRACTION PHACO AND INTRAOCULAR LENS PLACEMENT ([facility]);  Surgeon: Williams Che, MD;  Location: [facility];  Service: Ophthalmology;  Laterality: Left;  CDE 3.61   COLONOSCOPY WITH PROPOFOL N/A 08/09/2014   Procedure: COLONOSCOPY WITH PROPOFOL;  Surgeon: Juanita Craver, MD;  Location: [facility];  Service: Endoscopy;  Laterality: N/A;   CYSTOCELE REPAIR     ESOPHAGOGASTRODUODENOSCOPY (EGD) WITH PROPOFOL N/A 08/09/2014   Procedure: ESOPHAGOGASTRODUODENOSCOPY (EGD) WITH PROPOFOL;  Surgeon: Juanita Craver, MD;  [facility]: [facility];  Service: Endoscopy;  Laterality: N/A;   EYE SURGERY  11/01/2012   BOTH EYES CATARACTS   INSERT / REPLACE / REMOVE PACEMAKER  10/06/10   MDT  implanted by Dr [name]   KNEE ARTHROSCOPY     both   LEFT HEART CATH AND CORONARY ANGIOGRAPHY N/A 03/24/2018   Procedure: LEFT HEART CATH AND CORONARY ANGIOGRAPHY;  Surgeon: Lorretta Harp, MD;  Location: [facility];  Service: Cardiovascular;  Laterality: N/A;   OVARY SURGERY     removal   REPAIR RECTOCELE     SIMPLE MASTECTOMY WITH AXILLARY SENTINEL NODE BIOPSY Left 01/09/2015   Procedure: Irrigation and Drainage Abcess left Axilla;  Surgeon: Jackolyn Confer, MD;  Location: [facility];  Service: General;  Laterality: Left;   TONSILLECTOMY  AGE 27   TOTAL ABDOMINAL HYSTERECTOMY  1971    Current Outpatient Medications  Medication Sig Dispense Refill   acetaminophen (TYLENOL) 650 MG CR tablet Take 650 mg by mouth every 8 (eight) hours as needed for pain.     albuterol (VENTOLIN HFA) 108 (90 Base) MCG/ACT inhaler Inhale 2 puffs into the lungs every 4 (four) hours as needed for wheezing.      apixaban (ELIQUIS) 5 MG TABS tablet  Take 1 tablet by mouth twice daily 180 tablet 1   atorvastatin (LIPITOR) 40 MG tablet Take 40 mg by mouth at bedtime.      budesonide-formoterol (SYMBICORT) 160-4.5 MCG/ACT inhaler Inhale 2 puffs into the lungs 2 (two) times daily.     diclofenac Sodium (VOLTAREN) 1 % GEL Apply 2 g topically 2 (two) times daily as needed (for pain).      diltiazem (CARDIZEM CD) 120 MG 24 hr capsule Take 1 capsule by mouth once daily 90 capsule 3   DULoxetine (CYMBALTA) 30 MG capsule Take 30 mg by mouth at bedtime.      famotidine (PEPCID) 20 MG tablet Take 20 mg by mouth daily.     fenofibrate 160 MG tablet Take 160 mg by mouth every evening.     furosemide (LASIX) 20 MG tablet Take 1 tablet by mouth once daily 90 tablet 3   gabapentin (NEURONTIN) 100 MG capsule Take 200 mg by mouth at bedtime.     ipratropium-albuterol (DUONEB) 0.5-2.5 (3) MG/3ML SOLN Take 3 mLs by nebulization 4 (four) times daily. 360 mL 1   isosorbide mononitrate (IMDUR) 30 MG 24 hr tablet Take 1 tablet by mouth twice daily 180 tablet 3   metFORMIN (GLUCOPHAGE) 1000 MG tablet Take 500 mg by mouth 2 (two) times daily.      metoprolol succinate (TOPROL-XL) 100 MG 24 hr tablet Take 1 tablet by mouth once daily 90 tablet 3   midodrine (PROAMATINE) 5 MG tablet Take 1 tablet (5 mg total) by mouth in the morning and at bedtime. 60 tablet 5   nitroGLYCERIN (NITROSTAT) 0.4 MG SL tablet DISSOLVE ONE TABLET UNDER THE TONGUE EVERY 5 MINUTES AS NEEDED FOR CHEST PAIN.  DO NOT EXCEED A TOTAL OF 3 DOSES IN 15 MINUTES 25 tablet 3   OXYGEN Inhale 2 L into the lungs continuous.     Probiotic Product (ALIGN PO) Take 1 capsule by mouth daily.     spironolactone (ALDACTONE) 25 MG tablet Take 1/2 (one-half) tablet by mouth once daily 45 tablet 3   No current facility-administered medications for this visit.   Allergies:  Bee venom and Latex   ROS: No orthopnea or PND.  Physical Exam: VS:  BP 118/70   Pulse 88   Ht '5\' 6"'$  (1.676 m)   Wt 188 lb (85.3 kg)    SpO2 100%   BMI 30.34 kg/m , BMI Body mass index is 30.34 kg/m.  Wt Readings from Last 3 Encounters:  08/03/22 188 lb (85.3 kg)  06/26/22 193 lb 3.2 oz (87.6 kg)  03/25/22 194 lb 6.4 oz (88.2 kg)    General: Patient appears comfortable at rest.  Wearing oxygen via nasal cannula. HEENT: Conjunctiva and lids normal. Neck: Supple, no elevated JVP or carotid bruits, no thyromegaly. Lungs: Clear to auscultation, nonlabored breathing at rest. Cardiac: Regular rate and rhythm, no S3, 1/6 systolic murmur. Extremities: No pitting edema.  ECG:  An ECG dated 07/10/2021 was personally reviewed today and demonstrated:  Ventricular paced rhythm.  Recent Labwork:  April 2023: Hemoglobin 11.5, platelets 263, BUN 30, creatinine 1.33, potassium 3.8, AST 20, ALT 14, hemoglobin A1c 5.8%, cholesterol 132, triglycerides 155, HDL 34, LDL 71  Other Studies Reviewed Today:  Echocardiogram 05/06/2020:  1. Left ventricular ejection fraction, by estimation, is 60 to 65%. The  left ventricle has normal function. The left ventricle has no regional  wall motion abnormalities. There is mild left ventricular hypertrophy.  Left ventricular diastolic parameters  are consistent with Grade II diastolic dysfunction (pseudonormalization).  Elevated left atrial pressure.   2. Right ventricular systolic function is normal. The right ventricular  size is normal. There is moderately elevated pulmonary artery systolic  pressure.   3. Left atrial size was severely dilated.   4. Right atrial size was severely dilated.   5. The mitral valve is normal in structure. Mild to moderate mitral valve  regurgitation. No evidence of mitral stenosis.   6. The aortic valve has an indeterminant number of cusps. Aortic valve  regurgitation is not visualized. No aortic stenosis is present.   7. The inferior vena cava is normal in size with greater than 50%  respiratory variability, suggesting right atrial pressure of 3 mmHg.    Assessment and Plan:  1.  Persistent atrial fibrillation with CHA2DS2-VASc score of 6.  Currently with ventricular paced rhythm and atrial tracking.  He remains on combination of Cardizem CD and Toprol-XL, also Eliquis for stroke prophylaxis.  I reviewed her interval lab work.  2.  Orthostatic dizziness and intermittent falls.  Question autonomic dysfunction with orthostatic hypotension, also in the setting of regular medical therapy including AV nodal blockers.  We will try midodrine 5 mg twice daily to see if this makes a difference.  3.  Branch vessel CAD by previous cardiac catheterization.  No angina with current level of activity.  She is on Lipitor, Toprol-XL, Imdur, and as needed nitroglycerin.  4.  History of symptomatic second-degree heart block, Medtronic pacemaker in place with normal function.  Medication Adjustments/Labs and Tests Ordered: Current medicines are reviewed at length with the patient today.  Concerns regarding medicines are outlined above.   Tests Ordered: Orders Placed This Encounter  Procedures   EKG 12-Lead    Medication Changes: Meds ordered this encounter  Medications   midodrine (PROAMATINE) 5 MG tablet    Sig: Take 1 tablet (5 mg total) by mouth in the morning and at bedtime.    Dispense:  60 tablet    Refill:  5    08/03/22 New Start    Disposition:  Follow up  6 months.  Signed, Satira Sark, MD, [facility] 08/03/2022 11:23 AM    [facility] [place], [place], [place] [address] Phone: [phone]; Fax: [phone]

## 2022-08-03 ENCOUNTER — Encounter: Payer: Self-pay | Admitting: Cardiology

## 2022-08-03 ENCOUNTER — Ambulatory Visit: Payer: Medicare HMO | Attending: Cardiology | Admitting: Cardiology

## 2022-08-03 VITALS — BP 118/70 | HR 88 | Ht 66.0 in | Wt 188.0 lb

## 2022-08-03 DIAGNOSIS — I25119 Atherosclerotic heart disease of native coronary artery with unspecified angina pectoris: Secondary | ICD-10-CM

## 2022-08-03 DIAGNOSIS — R42 Dizziness and giddiness: Secondary | ICD-10-CM | POA: Diagnosis not present

## 2022-08-03 DIAGNOSIS — I4819 Other persistent atrial fibrillation: Secondary | ICD-10-CM | POA: Diagnosis not present

## 2022-08-03 MED ORDER — MIDODRINE HCL 5 MG PO TABS: 5.0000 mg | ORAL_TABLET | Freq: Two times a day (BID) | ORAL | 5 refills | Status: DC

## 2022-08-03 NOTE — Patient Instructions (Addendum)
Medication Instructions:  Your physician has recommended you make the following change in your medication:  Start midodrine 5 mg twice a day Continue all other medications as directed  Labwork: none  Testing/Procedures: none  Follow-Up:  Your physician recommends that you schedule a follow-up appointment in: 6 months  Any Other Special Instructions Will Be Listed Below (If Applicable).  If you need a refill on your cardiac medications before your next appointment, please call your pharmacy.

## 2022-08-06 ENCOUNTER — Ambulatory Visit
Admission: RE | Admit: 2022-08-06 | Discharge: 2022-08-06 | Disposition: A | Discharge status: Home / Self Care | Payer: Medicare HMO | Source: Ambulatory Visit | Attending: Nurse Practitioner | Admitting: Nurse Practitioner

## 2022-08-06 ENCOUNTER — Ambulatory Visit: Payer: Medicare HMO

## 2022-08-06 ENCOUNTER — Other Ambulatory Visit: Payer: Self-pay | Admitting: Nurse Practitioner

## 2022-08-06 DIAGNOSIS — R928 Other abnormal and inconclusive findings on diagnostic imaging of breast: Secondary | ICD-10-CM

## 2022-08-06 DIAGNOSIS — R921 Mammographic calcification found on diagnostic imaging of breast: Secondary | ICD-10-CM

## 2022-08-07 ENCOUNTER — Encounter: Payer: Self-pay | Admitting: Cardiology

## 2022-08-18 ENCOUNTER — Telehealth: Payer: Self-pay | Admitting: *Deleted

## 2022-08-18 NOTE — Telephone Encounter (Signed)
Brandi Hunter, from the [facility] called back. I was able to confirm with her that they are not needing any cardiac clearance or clearance. Per Brandi Hunter, the pt does not need to hold her blood thinner for her breast Bx. I thanked Brandi Hunter for her help. I assured their office that I will the pt know that we spoke and she does not need to hold her blood thinner.

## 2022-08-18 NOTE — Telephone Encounter (Signed)
See MY CHART messages for the pt. I have called and left a message for [facility] to please send STAT clearance request to [phone] attn: pre op team. We have not received a clearance request for upcoming Left breast Bx. Pt is asking if she needs to hold her Eliquis.

## 2022-08-19 ENCOUNTER — Ambulatory Visit
Admission: RE | Admit: 2022-08-19 | Discharge: 2022-08-19 | Disposition: A | Discharge status: Home / Self Care | Payer: Medicare HMO | Source: Ambulatory Visit | Attending: Nurse Practitioner | Admitting: Nurse Practitioner

## 2022-08-19 DIAGNOSIS — R921 Mammographic calcification found on diagnostic imaging of breast: Secondary | ICD-10-CM

## 2022-09-11 ENCOUNTER — Ambulatory Visit (INDEPENDENT_AMBULATORY_CARE_PROVIDER_SITE_OTHER): Payer: Medicare HMO

## 2022-09-11 DIAGNOSIS — I441 Atrioventricular block, second degree: Secondary | ICD-10-CM | POA: Diagnosis not present

## 2022-09-11 LAB — CUP PACEART REMOTE DEVICE CHECK
Battery Impedance: 3776 Ohm
Battery Remaining Longevity: 18 mo
Battery Voltage: 2.67 V
Brady Statistic RV Percent Paced: 89 %
Date Time Interrogation Session: 20231022201446
Implantable Lead Connection Status: 753985
Implantable Lead Connection Status: 753985
Implantable Lead Implant Date: 20111121
Implantable Lead Implant Date: 20111121
Implantable Lead Location: 753859
Implantable Lead Location: 753860
Implantable Lead Model: 4469
Implantable Lead Model: 4470
Implantable Lead Serial Number: 548226
Implantable Lead Serial Number: 687643
Implantable Pulse Generator Implant Date: 20111121
Lead Channel Impedance Value: 487 Ohm
Lead Channel Impedance Value: 67 Ohm
Lead Channel Pacing Threshold Amplitude: 0.75 V
Lead Channel Pacing Threshold Pulse Width: 0.4 ms
Lead Channel Setting Pacing Amplitude: 2.5 V
Lead Channel Setting Pacing Pulse Width: 0.4 ms
Lead Channel Setting Sensing Sensitivity: 5.6 mV
Zone Setting Status: 755011
Zone Setting Status: 755011

## 2022-09-17 NOTE — Progress Notes (Signed)
Remote pacemaker transmission.   

## 2022-10-01 ENCOUNTER — Emergency Department (HOSPITAL_COMMUNITY): Payer: Medicare HMO

## 2022-10-01 ENCOUNTER — Inpatient Hospital Stay (HOSPITAL_COMMUNITY)
Admission: EM | Admit: 2022-10-01 | Discharge: 2022-10-06 | DRG: 690 | Disposition: A | Discharge status: Home Health | Payer: Medicare HMO | Source: Ambulatory Visit | Attending: Internal Medicine | Admitting: Internal Medicine

## 2022-10-01 ENCOUNTER — Other Ambulatory Visit: Payer: Self-pay

## 2022-10-01 ENCOUNTER — Observation Stay (HOSPITAL_COMMUNITY): Payer: Medicare HMO

## 2022-10-01 DIAGNOSIS — Z9012 Acquired absence of left breast and nipple: Secondary | ICD-10-CM

## 2022-10-01 DIAGNOSIS — N3000 Acute cystitis without hematuria: Secondary | ICD-10-CM | POA: Diagnosis not present

## 2022-10-01 DIAGNOSIS — Z6830 Body mass index (BMI) 30.0-30.9, adult: Secondary | ICD-10-CM

## 2022-10-01 DIAGNOSIS — Z8249 Family history of ischemic heart disease and other diseases of the circulatory system: Secondary | ICD-10-CM

## 2022-10-01 DIAGNOSIS — K5900 Constipation, unspecified: Secondary | ICD-10-CM | POA: Diagnosis present

## 2022-10-01 DIAGNOSIS — I959 Hypotension, unspecified: Secondary | ICD-10-CM | POA: Diagnosis present

## 2022-10-01 DIAGNOSIS — Z9981 Dependence on supplemental oxygen: Secondary | ICD-10-CM

## 2022-10-01 DIAGNOSIS — E6609 Other obesity due to excess calories: Secondary | ICD-10-CM

## 2022-10-01 DIAGNOSIS — E861 Hypovolemia: Secondary | ICD-10-CM

## 2022-10-01 DIAGNOSIS — G4733 Obstructive sleep apnea (adult) (pediatric): Secondary | ICD-10-CM | POA: Diagnosis present

## 2022-10-01 DIAGNOSIS — Z8042 Family history of malignant neoplasm of prostate: Secondary | ICD-10-CM

## 2022-10-01 DIAGNOSIS — J449 Chronic obstructive pulmonary disease, unspecified: Secondary | ICD-10-CM | POA: Diagnosis present

## 2022-10-01 DIAGNOSIS — I9589 Other hypotension: Secondary | ICD-10-CM

## 2022-10-01 DIAGNOSIS — E860 Dehydration: Secondary | ICD-10-CM | POA: Diagnosis present

## 2022-10-01 DIAGNOSIS — E669 Obesity, unspecified: Secondary | ICD-10-CM | POA: Diagnosis present

## 2022-10-01 DIAGNOSIS — I5032 Chronic diastolic (congestive) heart failure: Secondary | ICD-10-CM | POA: Diagnosis present

## 2022-10-01 DIAGNOSIS — N390 Urinary tract infection, site not specified: Principal | ICD-10-CM | POA: Diagnosis present

## 2022-10-01 DIAGNOSIS — I482 Chronic atrial fibrillation, unspecified: Secondary | ICD-10-CM | POA: Diagnosis present

## 2022-10-01 DIAGNOSIS — I441 Atrioventricular block, second degree: Secondary | ICD-10-CM | POA: Diagnosis present

## 2022-10-01 DIAGNOSIS — R112 Nausea with vomiting, unspecified: Secondary | ICD-10-CM | POA: Diagnosis present

## 2022-10-01 DIAGNOSIS — Z87440 Personal history of urinary (tract) infections: Secondary | ICD-10-CM

## 2022-10-01 DIAGNOSIS — Z91040 Latex allergy status: Secondary | ICD-10-CM

## 2022-10-01 DIAGNOSIS — K219 Gastro-esophageal reflux disease without esophagitis: Secondary | ICD-10-CM | POA: Diagnosis present

## 2022-10-01 DIAGNOSIS — N1832 Chronic kidney disease, stage 3b: Secondary | ICD-10-CM | POA: Diagnosis present

## 2022-10-01 DIAGNOSIS — Z950 Presence of cardiac pacemaker: Secondary | ICD-10-CM

## 2022-10-01 DIAGNOSIS — E1122 Type 2 diabetes mellitus with diabetic chronic kidney disease: Secondary | ICD-10-CM | POA: Diagnosis present

## 2022-10-01 DIAGNOSIS — Z7901 Long term (current) use of anticoagulants: Secondary | ICD-10-CM

## 2022-10-01 DIAGNOSIS — R6510 Systemic inflammatory response syndrome (SIRS) of non-infectious origin without acute organ dysfunction: Secondary | ICD-10-CM

## 2022-10-01 DIAGNOSIS — Z833 Family history of diabetes mellitus: Secondary | ICD-10-CM

## 2022-10-01 DIAGNOSIS — Z79899 Other long term (current) drug therapy: Secondary | ICD-10-CM

## 2022-10-01 DIAGNOSIS — Z87891 Personal history of nicotine dependence: Secondary | ICD-10-CM

## 2022-10-01 DIAGNOSIS — E119 Type 2 diabetes mellitus without complications: Secondary | ICD-10-CM

## 2022-10-01 DIAGNOSIS — I4821 Permanent atrial fibrillation: Secondary | ICD-10-CM | POA: Diagnosis present

## 2022-10-01 DIAGNOSIS — J9611 Chronic respiratory failure with hypoxia: Secondary | ICD-10-CM | POA: Diagnosis present

## 2022-10-01 DIAGNOSIS — Z66 Do not resuscitate: Secondary | ICD-10-CM | POA: Diagnosis present

## 2022-10-01 DIAGNOSIS — E785 Hyperlipidemia, unspecified: Secondary | ICD-10-CM | POA: Diagnosis present

## 2022-10-01 DIAGNOSIS — I2510 Atherosclerotic heart disease of native coronary artery without angina pectoris: Secondary | ICD-10-CM | POA: Diagnosis present

## 2022-10-01 DIAGNOSIS — I130 Hypertensive heart and chronic kidney disease with heart failure and stage 1 through stage 4 chronic kidney disease, or unspecified chronic kidney disease: Secondary | ICD-10-CM | POA: Diagnosis present

## 2022-10-01 DIAGNOSIS — I480 Paroxysmal atrial fibrillation: Secondary | ICD-10-CM | POA: Diagnosis present

## 2022-10-01 DIAGNOSIS — J4489 Other specified chronic obstructive pulmonary disease: Secondary | ICD-10-CM | POA: Diagnosis present

## 2022-10-01 DIAGNOSIS — Z1152 Encounter for screening for COVID-19: Secondary | ICD-10-CM

## 2022-10-01 DIAGNOSIS — R54 Age-related physical debility: Secondary | ICD-10-CM | POA: Diagnosis present

## 2022-10-01 DIAGNOSIS — Z7951 Long term (current) use of inhaled steroids: Secondary | ICD-10-CM

## 2022-10-01 DIAGNOSIS — Z7984 Long term (current) use of oral hypoglycemic drugs: Secondary | ICD-10-CM

## 2022-10-01 DIAGNOSIS — I951 Orthostatic hypotension: Secondary | ICD-10-CM | POA: Diagnosis present

## 2022-10-01 LAB — CBC WITH DIFFERENTIAL/PLATELET
Abs Immature Granulocytes: 0.03 10*3/uL (ref 0.00–0.07)
Basophils Absolute: 0 10*3/uL (ref 0.0–0.1)
Basophils Relative: 1 %
Eosinophils Absolute: 0.1 10*3/uL (ref 0.0–0.5)
Eosinophils Relative: 2 %
HCT: 34 % — ABNORMAL LOW (ref 36.0–46.0)
Hemoglobin: 10.7 g/dL — ABNORMAL LOW (ref 12.0–15.0)
Immature Granulocytes: 0 %
Lymphocytes Relative: 17 %
Lymphs Abs: 1.2 10*3/uL (ref 0.7–4.0)
MCH: 30.1 pg (ref 26.0–34.0)
MCHC: 31.5 g/dL (ref 30.0–36.0)
MCV: 95.8 fL (ref 80.0–100.0)
Monocytes Absolute: 0.7 10*3/uL (ref 0.1–1.0)
Monocytes Relative: 9 %
Neutro Abs: 4.9 10*3/uL (ref 1.7–7.7)
Neutrophils Relative %: 71 %
Platelets: 390 10*3/uL (ref 150–400)
RBC: 3.55 MIL/uL — ABNORMAL LOW (ref 3.87–5.11)
RDW: 15 % (ref 11.5–15.5)
WBC: 7 10*3/uL (ref 4.0–10.5)
nRBC: 0 % (ref 0.0–0.2)

## 2022-10-01 LAB — COMPREHENSIVE METABOLIC PANEL
ALT: 15 U/L (ref 0–44)
AST: 28 U/L (ref 15–41)
Albumin: 2.7 g/dL — ABNORMAL LOW (ref 3.5–5.0)
Alkaline Phosphatase: 50 U/L (ref 38–126)
Anion gap: 12 (ref 5–15)
BUN: 39 mg/dL — ABNORMAL HIGH (ref 8–23)
CO2: 24 mmol/L (ref 22–32)
Calcium: 8.9 mg/dL (ref 8.9–10.3)
Chloride: 103 mmol/L (ref 98–111)
Creatinine, Ser: 1.87 mg/dL — ABNORMAL HIGH (ref 0.44–1.00)
GFR, Estimated: 27 mL/min — ABNORMAL LOW (ref >60)
Glucose, Bld: 120 mg/dL — ABNORMAL HIGH (ref 70–99)
Potassium: 4.8 mmol/L (ref 3.5–5.1)
Sodium: 139 mmol/L (ref 135–145)
Total Bilirubin: 0.7 mg/dL (ref 0.3–1.2)
Total Protein: 5.7 g/dL — ABNORMAL LOW (ref 6.5–8.1)

## 2022-10-01 LAB — APTT: aPTT: 33 seconds (ref 24–36)

## 2022-10-01 LAB — RESP PANEL BY RT-PCR (FLU A&B, COVID) ARPGX2
Influenza A by PCR: NEGATIVE
Influenza B by PCR: NEGATIVE
SARS Coronavirus 2 by RT PCR: NEGATIVE

## 2022-10-01 LAB — PROTIME-INR
INR: 2 — ABNORMAL HIGH (ref 0.8–1.2)
Prothrombin Time: 22.6 seconds — ABNORMAL HIGH (ref 11.4–15.2)

## 2022-10-01 LAB — LACTIC ACID, PLASMA
Lactic Acid, Venous: 2.8 mmol/L (ref 0.5–1.9)
Lactic Acid, Venous: 1.5 mmol/L (ref 0.5–1.9)

## 2022-10-01 MED ORDER — ALBUTEROL SULFATE (2.5 MG/3ML) 0.083% IN NEBU: 3.0000 mL | INHALATION_SOLUTION | RESPIRATORY_TRACT | Status: DC | PRN

## 2022-10-01 MED ORDER — MIDODRINE HCL 5 MG PO TABS
5.0000 mg | ORAL_TABLET | Freq: Two times a day (BID) | ORAL | Status: DC
  Administered 2022-10-02 – 2022-10-06 (×7): 5 mg via ORAL
  Filled 2022-10-01 (×9): qty 1

## 2022-10-01 MED ORDER — FUROSEMIDE 20 MG PO TABS: 20.0000 mg | ORAL_TABLET | Freq: Every day | ORAL | Status: DC

## 2022-10-01 MED ORDER — SODIUM CHLORIDE 0.9 % IV SOLN
2.0000 g | INTRAVENOUS | Status: DC
  Administered 2022-10-01 – 2022-10-05 (×5): 2 g via INTRAVENOUS
  Filled 2022-10-01 (×5): qty 20

## 2022-10-01 MED ORDER — DULOXETINE HCL 30 MG PO CPEP
30.0000 mg | ORAL_CAPSULE | Freq: Every day | ORAL | Status: DC
  Administered 2022-10-01 – 2022-10-05 (×5): 30 mg via ORAL
  Filled 2022-10-01 (×5): qty 1

## 2022-10-01 MED ORDER — APIXABAN 5 MG PO TABS
5.0000 mg | ORAL_TABLET | Freq: Two times a day (BID) | ORAL | Status: DC
  Administered 2022-10-01 – 2022-10-06 (×10): 5 mg via ORAL
  Filled 2022-10-01 (×10): qty 1

## 2022-10-01 MED ORDER — SODIUM CHLORIDE 0.9 % IV BOLUS
1000.0000 mL | Freq: Once | INTRAVENOUS | Status: AC
  Administered 2022-10-01: 1000 mL via INTRAVENOUS

## 2022-10-01 MED ORDER — GABAPENTIN 100 MG PO CAPS
200.0000 mg | ORAL_CAPSULE | Freq: Every day | ORAL | Status: DC
  Administered 2022-10-01 – 2022-10-05 (×5): 200 mg via ORAL
  Filled 2022-10-01 (×5): qty 2

## 2022-10-01 MED ORDER — ONDANSETRON HCL 4 MG/2ML IJ SOLN
4.0000 mg | Freq: Once | INTRAMUSCULAR | Status: AC
  Administered 2022-10-01: 4 mg via INTRAVENOUS
  Filled 2022-10-01: qty 2

## 2022-10-01 MED ORDER — ACETAMINOPHEN 325 MG PO TABS: 650.0000 mg | ORAL_TABLET | Freq: Three times a day (TID) | ORAL | Status: DC | PRN

## 2022-10-01 MED ORDER — FAMOTIDINE 20 MG PO TABS
20.0000 mg | ORAL_TABLET | Freq: Every day | ORAL | Status: DC
  Administered 2022-10-02 – 2022-10-06 (×5): 20 mg via ORAL
  Filled 2022-10-01 (×5): qty 1

## 2022-10-01 MED ORDER — ISOSORBIDE MONONITRATE ER 30 MG PO TB24
30.0000 mg | ORAL_TABLET | Freq: Two times a day (BID) | ORAL | Status: DC
  Administered 2022-10-01 – 2022-10-03 (×5): 30 mg via ORAL
  Filled 2022-10-01 (×5): qty 1

## 2022-10-01 MED ORDER — LORAZEPAM 0.5 MG PO TABS: 0.5000 mg | ORAL_TABLET | Freq: Two times a day (BID) | ORAL | Status: DC | PRN

## 2022-10-01 MED ORDER — MOMETASONE FURO-FORMOTEROL FUM 200-5 MCG/ACT IN AERO
2.0000 | INHALATION_SPRAY | Freq: Two times a day (BID) | RESPIRATORY_TRACT | Status: DC
  Administered 2022-10-02 – 2022-10-06 (×9): 2 via RESPIRATORY_TRACT
  Filled 2022-10-01 (×2): qty 8.8

## 2022-10-01 MED ORDER — METOPROLOL SUCCINATE ER 100 MG PO TB24
100.0000 mg | ORAL_TABLET | Freq: Every day | ORAL | Status: DC
  Administered 2022-10-02 – 2022-10-06 (×5): 100 mg via ORAL
  Filled 2022-10-01 (×5): qty 1

## 2022-10-01 MED ORDER — ATORVASTATIN CALCIUM 40 MG PO TABS
40.0000 mg | ORAL_TABLET | Freq: Every day | ORAL | Status: DC
  Administered 2022-10-01 – 2022-10-05 (×5): 40 mg via ORAL
  Filled 2022-10-01 (×5): qty 1

## 2022-10-01 MED ORDER — LACTATED RINGERS IV SOLN: INTRAVENOUS | Status: AC

## 2022-10-01 MED ORDER — FENOFIBRATE 160 MG PO TABS
160.0000 mg | ORAL_TABLET | Freq: Every evening | ORAL | Status: DC
  Administered 2022-10-01 – 2022-10-05 (×5): 160 mg via ORAL
  Filled 2022-10-01 (×5): qty 1

## 2022-10-01 MED ORDER — ONDANSETRON HCL 4 MG/2ML IJ SOLN: 4.0000 mg | Freq: Four times a day (QID) | INTRAMUSCULAR | Status: DC | PRN

## 2022-10-01 NOTE — ED Notes (Signed)
Pt has not urinated. Encouraged to keep trying.

## 2022-10-01 NOTE — Assessment & Plan Note (Signed)
-  continue Eliquis and metoprolol

## 2022-10-01 NOTE — ED Provider Notes (Signed)
[facility] EMERGENCY DEPARTMENT Provider Note   CSN: [id number] Arrival date & time: 10/01/22  1603     History  Chief Complaint  Patient presents with   Hypotension   Nausea    Brandi Hunter is a 81 y.o. 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  She is on midodrine at home.  Last week was seen by outside hospital for hypotension as well.  Diagnosed with UTI started on Keflex.  Completed the course.  She was seen by her PCP today for follow-up.  Noted to have persistent hypotension.  UA obtained in the office was positive for UTI.  Patient midst to fatigue.  States she did develop some dysuria and urgency this morning.  She has not suprapubic pain.  She feels nauseous without any active vomiting.  No flank pain.  No hematuria.  No fever at home.  She has been compliant with her home meds.  Patient denies any cough, congestion, rhinorrhea, chest pain, shortness of breath, lower extremity edema, PND orthopnea.  EMS noted patient to be hypotensive on their arrival at PCP office with systolic blood pressure in the 70s.  They gave patient 4 mg Zofran as well as 750 cc IV fluids with improved whenever BP. No AMS.  Started on Midodrine due to orthostatic hypotension Sept 2023  Bps run low 100's at baseline per chart review  HPI     Home Medications Prior to Admission medications   Medication Sig Start Date End Date Taking? Authorizing Provider  acetaminophen (TYLENOL) 650 MG CR tablet Take 650 mg by mouth every 8 (eight) hours as needed for pain.   Yes [provider]  albuterol (VENTOLIN HFA) 108 (90 Base) MCG/ACT inhaler Inhale 2 puffs into the lungs every 4 (four) hours as needed for wheezing.  07/21/12  Yes [provider]  apixaban (ELIQUIS) 5 MG TABS tablet Take 1 tablet by mouth twice daily  04/07/22  Yes Satira Sark, MD  atorvastatin (LIPITOR) 40 MG tablet Take 40 mg by mouth at bedtime.  03/17/13  Yes [provider]  budesonide-formoterol (SYMBICORT) 160-4.5 MCG/ACT inhaler Inhale 2 puffs into the lungs 2 (two) times daily.   Yes [provider]  diclofenac Sodium (VOLTAREN) 1 % GEL Apply 2 g topically 2 (two) times daily as needed (for pain).  05/03/20  Yes [provider]  diltiazem (CARDIZEM CD) 120 MG 24 hr capsule Take 1 capsule by mouth once daily 10/15/21  Yes Satira Sark, MD  DULoxetine (CYMBALTA) 30 MG capsule Take 30 mg by mouth at bedtime.  02/22/20  Yes [provider]  famotidine (PEPCID) 20 MG tablet Take 20 mg by mouth daily. 09/06/19  Yes [provider]  fenofibrate 160 MG tablet Take 160 mg by mouth every evening.   Yes [provider]  furosemide (LASIX) 20 MG tablet Take 1 tablet by mouth once daily 04/07/22  Yes Satira Sark, MD  gabapentin (NEURONTIN) 100 MG capsule Take 200 mg by mouth at bedtime. 12/18/20  Yes [provider]  ipratropium-albuterol (DUONEB) 0.5-2.5 (3) MG/3ML SOLN Take 3 mLs by nebulization 4 (four) times daily. 08/31/19  Yes Johnson, Clanford L, MD  isosorbide mononitrate (IMDUR) 30 MG 24 hr tablet Take 1 tablet by mouth twice daily 03/06/22  Yes Satira Sark, MD  LORazepam (ATIVAN) 0.5 MG tablet Take by mouth See admin  instructions. Take half to one tablet (0.25 mg - 0.5 mg) by mouth twice daily as needed for anxiety 09/18/22  Yes [provider]  metFORMIN (GLUCOPHAGE) 1000 MG tablet Take 500 mg by mouth 2 (two) times daily.  03/05/20  Yes [provider]  metoprolol succinate (TOPROL-XL) 100 MG 24 hr tablet Take 1 tablet by mouth once daily 03/06/22  Yes Satira Sark, MD  midodrine (PROAMATINE) 5 MG tablet Take 1 tablet (5 mg total) by mouth in the morning and at bedtime. 08/03/22  Yes Satira Sark, MD  nitroGLYCERIN (NITROSTAT) 0.4 MG  SL tablet DISSOLVE ONE TABLET UNDER THE TONGUE EVERY 5 MINUTES AS NEEDED FOR CHEST PAIN.  DO NOT EXCEED A TOTAL OF 3 DOSES IN 15 MINUTES Patient taking differently: Place 0.4 mg under the tongue every 5 (five) minutes as needed for chest pain. DO NOT EXCEED A TOTAL OF 3 DOSES IN 15 MINUTES 09/19/20  Yes Satira Sark, MD  Probiotic Product (ALIGN PO) Take 1 capsule by mouth daily.   Yes [provider]  spironolactone (ALDACTONE) 25 MG tablet Take 1/2 (one-half) tablet by mouth once daily 04/07/22  Yes Satira Sark, MD  OXYGEN Inhale 2 L into the lungs continuous.    [provider]      Allergies    Bee venom and Latex    Review of Systems   Review of Systems  Constitutional:  Positive for fatigue.  HENT: Negative.    Respiratory: Negative.    Cardiovascular: Negative.   Gastrointestinal:  Positive for abdominal pain (suprapubic) and nausea. Negative for anal bleeding, blood in stool, constipation, diarrhea, rectal pain and vomiting.  Genitourinary:  Positive for dysuria, frequency and urgency. Negative for decreased urine volume, difficulty urinating, dyspareunia, enuresis, flank pain, genital sores, hematuria, menstrual problem, pelvic pain, vaginal bleeding, vaginal discharge and vaginal pain.  Musculoskeletal: Negative.   Neurological: Negative.   All other systems reviewed and are negative.   Physical Exam Updated Vital Signs BP (!) 145/80   Pulse 86   Temp 98.3 F (36.8 C) (Oral)   Resp 18   Ht '5\' 6"'$  (1.676 m)   Wt 85.3 kg   SpO2 100%   BMI 30.35 kg/m  Physical Exam Vitals and nursing note reviewed.  Constitutional:      General: She is not in acute distress.    Appearance: She is well-developed. She is not ill-appearing, toxic-appearing or diaphoretic.  HENT:     Head: Normocephalic and atraumatic.     Nose: Nose normal. No congestion or rhinorrhea.     Mouth/Throat:     Mouth: Mucous membranes are moist.  Eyes:     Pupils: Pupils are  equal, round, and reactive to light.  Cardiovascular:     Rate and Rhythm: Normal rate.     Pulses: Normal pulses.          Radial pulses are 2+ on the right side and 2+ on the left side.       Dorsalis pedis pulses are 2+ on the right side and 2+ on the left side.     Heart sounds: Normal heart sounds.  Pulmonary:     Effort: Pulmonary effort is normal. No respiratory distress.     Breath sounds: Normal breath sounds and air entry.     Comments: Clear bilaterally, speaks in full and is without difficulty Chest:     Comments: Nontender chest wall, old scarring to right upper chest from old pacemaker  Abdominal:     General: Bowel sounds are normal. There is no distension.     Palpations: Abdomen is soft. There is no mass.     Tenderness: There is abdominal tenderness. There is no right CVA tenderness, left CVA tenderness, guarding or rebound.     Hernia: No hernia is present.     Comments: Mild suprapubic tenderness.  Negative CVA tap.  Musculoskeletal:        General: No swelling, tenderness, deformity or signs of injury. Normal range of motion.     Cervical back: Normal range of motion.     Right lower leg: No edema.     Left lower leg: No edema.     Comments: No bony tenderness, full range of motion  Skin:    General: Skin is warm and dry.     Capillary Refill: Capillary refill takes less than 2 seconds.     Comments: Warm, well-perfused extremities  Neurological:     General: No focal deficit present.     Mental Status: She is alert.     Cranial Nerves: Cranial nerves 2-12 are intact.     Sensory: Sensation is intact.     Motor: Motor function is intact.  Psychiatric:        Mood and Affect: Mood normal.    ED Results / Procedures / Treatments   Labs (all labs ordered are listed, but only abnormal results are displayed) Labs Reviewed  LACTIC ACID, PLASMA - Abnormal; Notable for the following components:      Result Value   Lactic Acid, Venous 2.8 (*)    All other  components within normal limits  COMPREHENSIVE METABOLIC PANEL - Abnormal; Notable for the following components:   Glucose, Bld 120 (*)    BUN 39 (*)    Creatinine, Ser 1.87 (*)    Total Protein 5.7 (*)    Albumin 2.7 (*)    GFR, Estimated 27 (*)    All other components within normal limits  CBC WITH DIFFERENTIAL/PLATELET - Abnormal; Notable for the following components:   RBC 3.55 (*)    Hemoglobin 10.7 (*)    HCT 34.0 (*)    All other components within normal limits  PROTIME-INR - Abnormal; Notable for the following components:   Prothrombin Time 22.6 (*)    INR 2.0 (*)    All other components within normal limits  RESP PANEL BY RT-PCR (FLU A&B, COVID) ARPGX2  CULTURE, BLOOD (ROUTINE X 2)  CULTURE, BLOOD (ROUTINE X 2)  URINE CULTURE  LACTIC ACID, PLASMA  APTT  URINALYSIS, ROUTINE W REFLEX MICROSCOPIC   (ABNORMAL) POCT urinalysis dipstick (10/01/2022 2:24 PM EST) Lab Results - (ABNORMAL) POCT urinalysis dipstick (10/01/2022 2:24 PM EST) Component Value Ref Range Test Method Analysis Time Performed At Pathologist Signature  Color Orange (A) Yellow, Straw, Dark yellow, Colorless, Bright yellow, Xantho, Comment, Pale Yellow     [facility]    Clarity Cloudy (A) Clear, Slightly hazy     [facility]    Glucose,Urine 100. (A) Negative mg/dL     [facility]    Bilirubin Negative Negative     [facility]    Ketones,Urine Trace (A) Negative mg/dL     [facility]    Specific Gravity <=1.005 (A) 1.005 - 1.030     [facility]    Blood, Urine Small (A) Negative     [facility]  pH 5.0 5.0 - 9.0     [facility]    Protein 30. (A) Negative mg/dL     [facility]    Urobilinogen 1.0 0.2, 1.0 EU/dL     [facility]    Nitrite Positive (A) Negative     [facility]    Leukocyte Esterase Large (A) Negative     [facility]           EKG EKG Interpretation  Date/Time:  Thursday October 01 2022 16:10:57 EST Ventricular Rate:  90 PR Interval:  202 QRS Duration: 133 QT Interval:  410 QTC Calculation: 502 R Axis:   -85 Text Interpretation: Sinus rhythm Atrial premature complex IVCD, consider atypical RBBB LVH with secondary repolarization abnormality Anterolateral infarct, age indeterminate No acute changes Confirmed by [name] [phone]) on 10/01/2022 4:16:33 PM  Radiology DG Chest Port 1 View  Result Date: 10/01/2022 CLINICAL DATA:  Questionable sepsis.  Evaluate for abnormality. EXAM: PORTABLE CHEST 1 VIEW COMPARISON:  AP chest 05/05/2020 FINDINGS: Cardiac silhouette is again moderately enlarged. Right chest wall cardiac pacer with leads overlying the right atrium and right ventricle, similar to prior. Mediastinal contours are within normal limits. Mild calcification within the aortic arch. Mildly decreased lung volumes. No focal airspace opacity. No pulmonary edema, pleural effusion, or pneumothorax. No acute skeletal abnormality. IMPRESSION: 1. Mildly decreased lung volumes. No focal airspace opacity. 2. Cardiomegaly. Electronically Signed   By: Yvonne Kendall M.D.   On: 10/01/2022 16:32    Procedures .Critical Care  Performed by: Nettie Elm, PA-C Authorized by: Nettie Elm, PA-C   Critical care provider statement:    Critical care time (minutes):  35   Critical care was necessary to treat or prevent imminent or life-threatening deterioration of the following conditions:  Sepsis   Critical care was time spent personally by me on the following activities:  Development of treatment plan with patient or surrogate, discussions with consultants, evaluation of patient's response to treatment, examination of patient, ordering and review of laboratory studies, ordering and review of radiographic studies, ordering and performing treatments and interventions, pulse  oximetry, re-evaluation of patient's condition and review of old charts     Medications Ordered in ED Medications  cefTRIAXone (ROCEPHIN) 2 g in sodium chloride 0.9 % 100 mL IVPB (0 g Intravenous Stopped 10/01/22 1701)  ondansetron (ZOFRAN) injection 4 mg (has no administration in time range)  sodium chloride 0.9 % bolus 1,000 mL (0 mLs Intravenous Stopped 10/01/22 1701)    ED Course/ Medical Decision Making/ A&P    81 year old with multiple medical co-morbidities to include a high risk of mortality and morbidity including chronic respiratory failure due to COPD, CHF, heart block s/p pacemaker, chronic anticoagulation due to A-fib here for evaluation of persistent fatigue and hypotension.  Was seen by outside hospital 1 week ago for hypotension.  Was fluid responsive.  Diagnosed with UTI and started on Keflex.  Patient completed this.  She continued to feel poorly was seen by PCP today.  Noted to be hypotensive, UA positive for UTI PCP office sent over for sepsis rule out.  Has noted patient to be hypotensive transport with 70 systolic fluid responsive with 70 cc IV fluids.  On arrival she does appear dehydrated.  She is on chronic midodrine once daily which she has taken this morning.  She admits to medication compliance at home.  She is some mild suprapubic abdominal tenderness however  negative CVA tap.  Her extremities appear well-perfused with equal pulses.  She does not appear grossly fluid overloaded on exam I have low suspicion for cardiogenic shock as cause of her presentation.  No back pain, chest pain to suggest AAA, dissection.  Her heart and lungs are clear.  I suspect her hypotension likely multifactorial as patient states that she does have low blood pressure at baseline however could also be due to sepsis due to her urinary tract infection.  Initially called code sepsis due to UA at PCP office, hypotension.  Rocephin started.  I attempted to review prior urine culture from ED visit  last week from outside facility however just shows 100000 mixed flora.  No sensitivities.  Family arrived with MOST form at bedside.  It does show patient is DNR, DNI, no pressors.  They do have checked off that she would not want IV fluids or antibiotics.  I discussed this with patient and family.  At this time they are okay with IV fluids, IV antibiotics and admission to hospitalist.  They did confirm they would not want any blood pressure support in the form of IV medication and she is still a DNR, DNI.  Labs and imaging personally viewed and interpreted:  CBC without significant abnormality, hgb 10.7 CMP creatinine 1.87 similar to prior Lipase Lactic 2.0 BC pending UA pending Urine culture pending Chest xray without pneumonia, edema, pneumothorax EKG without ischemic changes  Patient reassessed.  I discussed plan. Family and patient agreeable.  Patient will need to be admitted for failed outpatient treatment for UTI, hypotension, SIRS criteria.  Urinalysis and computer from PCP today, will add on urine culture here.   Unfortunately unable to collect urine due to incontinence episode on arrival with EMS as well as got up to use bedside commode and urinated while having a bowel movement. Pt declines in and out.   We will admit to hospitalist team  Attending, Dr. Nechama Guard woke with hospitalist, Dr. Flossie Buffy who is agreeable to evaluate patient for admission  The patient appears reasonably stabilized for admission considering the current resources, flow, and capabilities available in the ED at this time, and I doubt any other [facility] requiring further screening and/or treatment in the ED prior to admission.                              Medical Decision Making Amount and/or Complexity of Data Reviewed Independent Historian: EMS    Details: EMS, sons in room External Data Reviewed: labs, radiology, ECG and notes. Labs: ordered. Decision-making details documented in ED Course. Radiology:  ordered and independent interpretation performed. Decision-making details documented in ED Course. ECG/medicine tests: ordered and independent interpretation performed. Decision-making details documented in ED Course.  Risk OTC drugs. Prescription drug management. Parenteral controlled substances. Decision regarding hospitalization. Diagnosis or treatment significantly limited by social determinants of health.          Final Clinical Impression(s) / ED Diagnoses Final diagnoses:  Acute cystitis without hematuria  SIRS (systemic inflammatory response syndrome) (HCC)  Hypotension, unspecified hypotension type    Rx / DC Orders ED Discharge Orders     None         Woody Kronberg A, PA-C 10/01/22 2123    Elgie Congo, MD 10/02/22 1222

## 2022-10-01 NOTE — Progress Notes (Signed)
[facility] is following sepsis bundle. 

## 2022-10-01 NOTE — Assessment & Plan Note (Signed)
Holding CPAP tonight with her active vomiting

## 2022-10-01 NOTE — Assessment & Plan Note (Addendum)
SBP in the 90s on arrival.  Likely due to recurrent UTI.  Now up to SBP 140s with fluid hydration.  Continuous IV fluid overnight. - On Midodrine for hx of orthostatic hypotension.Will continue.  -Hold Lasix, spironolactone and diltiazem overnight

## 2022-10-01 NOTE — Assessment & Plan Note (Signed)
-  UA was positive for UTI with large leukocyte Estrace, positive nitrite.  -continue IV Rocephin pending urine culture

## 2022-10-01 NOTE — Assessment & Plan Note (Signed)
-   Patient had HbA1C earlier this year of 5.9% -hold metformin -Hold SSI for now while on full liquid diet with her nausea/vomiting

## 2022-10-01 NOTE — ED Triage Notes (Addendum)
[facility] for hypotension and nausea from MD's office, Zofran '4mg'$  given by EMS, pt A&O x4

## 2022-10-01 NOTE — Assessment & Plan Note (Signed)
creatinine of 1.87 based on prior seems to be around her baseline -follow with repeat

## 2022-10-01 NOTE — Assessment & Plan Note (Signed)
BMI of 30

## 2022-10-01 NOTE — Assessment & Plan Note (Signed)
Asymptomatic from cardiac standpoint

## 2022-10-01 NOTE — Assessment & Plan Note (Signed)
-  reports nausea with new constipation for 10 days even before her urinary symptoms -abdominal exam is benign but will check stat abd X-ray to evaluate

## 2022-10-01 NOTE — Assessment & Plan Note (Signed)
-  stable with chronic respiratory failure on 2L

## 2022-10-01 NOTE — H&P (Signed)
History and Physical    Patient: Brandi Hunter XNT:[id number] DOB: 02-Jan-1941 DOA: 10/01/2022 DOS: the patient was seen and examined on 10/01/2022 PCP: [name], [place]  Patient coming from: Home  Chief Complaint:  Chief Complaint  Patient presents with   Hypotension   Nausea   HPI: Brandi Hunter is a 81 y.o. 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  Patient was seen by PCP today for dysuria, abdominal pain  starting this morning. Also has been nauseous with vomiting for about 10 days. Has felt constipation which is abnormal for her.  UA was positive for UTI with large leukocyte Estrace, positive nitrite.  She was also noted to have hypotension with SBP in the 90s and was sent to ED for evaluation.  She is on midodrine for hx of orthostatic hypotension.  Per outside records, she was evaluated at [facility] on 09/14/2022 with hypotension with SBP in the 70s and was found to have UTI.  She had improvement with hydration and was discharged home on 5 days of Keflex.  Culture at that time returned with mixed flora.   In the ED, temperature of 98.3 F, BP of 97/84 on arrival which was improved following fluid hydration up to 145/80.  On 2 L via nasal cannula.  WBC of 7, hemoglobin 10.7 which is around her baseline. Lact of 2.8.  Sodium of 139, potassium of 4.8, creatinine of 1.87 which is near her prior creatinine, normal LFTs.  Negative flu and COVID PCR.  Chest x-ray is negative.  Review of Systems: As mentioned in the history of present illness. All other systems reviewed and are negative. Past Medical History:  Diagnosis Date   Asthma    CAD (coronary artery disease)    a. catheterization in 03/2018 showing 90%  ostial OM stenosis with medical management recommended at that time   Chronic anticoagulation    COPD (chronic obstructive pulmonary disease) (HCC)    DJD (degenerative joint disease)    Essential hypertension    GERD (gastroesophageal reflux disease)    History of home oxygen therapy    Hyperlipidemia    Mobitz (type) II atrioventricular block    Obesity    Pacemaker    Implanted by Dr [name] (MDT) 10/06/10   PAF (paroxysmal atrial fibrillation) ([place])    Pancreatitis 2010 OR 2011   Sleep apnea    CPAP   Type 2 diabetes mellitus ([place])    Past Surgical History:  Procedure Laterality Date   CARDIOVERSION N/A 08/18/2019   Procedure: CARDIOVERSION;  Surgeon: Lelon Perla, MD;  Location: [facility];  Service: Cardiovascular;  Laterality: N/A;   CATARACT EXTRACTION W/PHACO  11/02/2011   Procedure: CATARACT EXTRACTION PHACO AND INTRAOCULAR LENS PLACEMENT ([place]);  Surgeon: Williams Che;  Location: [facility];  Service: Ophthalmology;  Laterality: Right;  CDE=7.33   CATARACT EXTRACTION W/PHACO  12/07/2011   Procedure: CATARACT EXTRACTION PHACO AND INTRAOCULAR LENS PLACEMENT ([facility]);  Surgeon: Williams Che, MD;  Location: [facility];  Service: Ophthalmology;  Laterality: Left;  CDE 3.61   COLONOSCOPY WITH PROPOFOL N/A 08/09/2014   Procedure: COLONOSCOPY WITH PROPOFOL;  Surgeon: Juanita Craver, MD;  Location: [facility];  Service: Endoscopy;  Laterality: N/A;   CYSTOCELE REPAIR     ESOPHAGOGASTRODUODENOSCOPY (  EGD) WITH PROPOFOL N/A 08/09/2014   Procedure: ESOPHAGOGASTRODUODENOSCOPY (EGD) WITH PROPOFOL;  Surgeon: Juanita Craver, MD;  Location: [facility];  Service: Endoscopy;  Laterality: N/A;   EYE SURGERY  11/01/2012   BOTH EYES CATARACTS   INSERT / REPLACE / REMOVE PACEMAKER  10/06/10   MDT  implanted by Dr [name]   KNEE ARTHROSCOPY     both   LEFT HEART CATH AND CORONARY ANGIOGRAPHY N/A 03/24/2018   Procedure: LEFT HEART CATH AND CORONARY ANGIOGRAPHY;  Surgeon: Lorretta Harp, MD;   Location: [facility];  Service: Cardiovascular;  Laterality: N/A;   OVARY SURGERY     removal   REPAIR RECTOCELE     SIMPLE MASTECTOMY WITH AXILLARY SENTINEL NODE BIOPSY Left 01/09/2015   Procedure: Irrigation and Drainage Abcess left Axilla;  Surgeon: Jackolyn Confer, MD;  Location: [facility];  Service: General;  Laterality: Left;   TONSILLECTOMY  AGE 25   TOTAL ABDOMINAL HYSTERECTOMY  1971   Social History:  reports that she quit smoking about 5 years ago. Her smoking use included cigarettes. She quit smokeless tobacco use about 10 years ago. She reports that she does not currently use alcohol after a past usage of about 1.0 standard drink of alcohol per week. She reports that she does not use drugs.  Allergies  Allergen Reactions   Bee Venom Swelling   Latex Swelling    LATEX CATHETERS    Family History  Problem Relation Age of Onset   Congestive Heart Failure Mother    Congestive Heart Failure Father    Osteoarthritis Sister    Prostate cancer Brother    Diabetes Brother    Anesthesia problems Neg Hx    Hypotension Neg Hx    Malignant hyperthermia Neg Hx    Pseudochol deficiency Neg Hx    Breast cancer Neg Hx     Prior to Admission medications   Medication Sig Start Date End Date Taking? Authorizing Provider  acetaminophen (TYLENOL) 650 MG CR tablet Take 650 mg by mouth every 8 (eight) hours as needed for pain.   Yes [provider]  albuterol (VENTOLIN HFA) 108 (90 Base) MCG/ACT inhaler Inhale 2 puffs into the lungs every 4 (four) hours as needed for wheezing.  07/21/12  Yes [provider]  apixaban (ELIQUIS) 5 MG TABS tablet Take 1 tablet by mouth twice daily 04/07/22  Yes Satira Sark, MD  atorvastatin (LIPITOR) 40 MG tablet Take 40 mg by mouth at bedtime.  03/17/13  Yes [provider]  budesonide-formoterol (SYMBICORT) 160-4.5 MCG/ACT inhaler Inhale 2 puffs into the lungs 2 (two) times daily.   Yes [provider]  diclofenac  Sodium (VOLTAREN) 1 % GEL Apply 2 g topically 2 (two) times daily as needed (for pain).  05/03/20  Yes [provider]  diltiazem (CARDIZEM CD) 120 MG 24 hr capsule Take 1 capsule by mouth once daily 10/15/21  Yes Satira Sark, MD  DULoxetine (CYMBALTA) 30 MG capsule Take 30 mg by mouth at bedtime.  02/22/20  Yes [provider]  famotidine (PEPCID) 20 MG tablet Take 20 mg by mouth daily. 09/06/19  Yes [provider]  fenofibrate 160 MG tablet Take 160 mg by mouth every evening.   Yes [provider]  furosemide (LASIX) 20 MG tablet Take 1 tablet by mouth once daily 04/07/22  Yes Satira Sark, MD  gabapentin (NEURONTIN) 100 MG capsule Take 200 mg by mouth at bedtime. 12/18/20  Yes [provider]  ipratropium-albuterol (DUONEB) 0.5-2.5 (3) MG/3ML SOLN Take 3 mLs by nebulization 4 (four) times daily. 08/31/19  Yes Johnson, Clanford L, MD  isosorbide mononitrate (IMDUR) 30 MG 24 hr tablet Take 1 tablet by mouth twice daily 03/06/22  Yes Satira Sark, MD  LORazepam (ATIVAN) 0.5 MG tablet Take by mouth See admin instructions. Take half to one tablet (0.25 mg - 0.5 mg) by mouth twice daily as needed for anxiety 09/18/22  Yes [provider]  metFORMIN (GLUCOPHAGE) 1000 MG tablet Take 500 mg by mouth 2 (two) times daily.  03/05/20  Yes [provider]  metoprolol succinate (TOPROL-XL) 100 MG 24 hr tablet Take 1 tablet by mouth once daily 03/06/22  Yes Satira Sark, MD  midodrine (PROAMATINE) 5 MG tablet Take 1 tablet (5 mg total) by mouth in the morning and at bedtime. 08/03/22  Yes Satira Sark, MD  nitroGLYCERIN (NITROSTAT) 0.4 MG SL tablet DISSOLVE ONE TABLET UNDER THE TONGUE EVERY 5 MINUTES AS NEEDED FOR CHEST PAIN.  DO NOT EXCEED A TOTAL OF 3 DOSES IN 15 MINUTES Patient taking differently: Place 0.4 mg under the tongue every 5 (five) minutes as needed for chest pain. DO NOT EXCEED A TOTAL OF 3 DOSES IN 15 MINUTES 09/19/20   Yes Satira Sark, MD  Probiotic Product (ALIGN PO) Take 1 capsule by mouth daily.   Yes [provider]  spironolactone (ALDACTONE) 25 MG tablet Take 1/2 (one-half) tablet by mouth once daily 04/07/22  Yes Satira Sark, MD  OXYGEN Inhale 2 L into the lungs continuous.    [provider]    Physical Exam: Vitals:   10/01/22 1800 10/01/22 1810 10/01/22 1930 10/01/22 2030  BP: (!) 143/71 121/65 (!) 150/72 (!) 145/80  Pulse: 73 70 69 86  Resp: (!) '21 18 20 18  '$ Temp:      TempSrc:      SpO2: 100% 100% 100% 100%  Weight:      Height:       Constitutional: NAD, calm, comfortable, ill-appearing elderly female laying in bed asleep initially and woke up with active nausea and vomiting Eyes: lids and conjunctivae normal ENMT: Mucous membranes are moist.  Neck: normal, supple Respiratory: clear to auscultation bilaterally, no wheezing, no crackles. Normal respiratory effort. No accessory muscle use.  Cardiovascular: Regular rate and rhythm, no murmurs / rubs / gallops. No extremity edema.   Abdomen: Soft, non-distended, no tenderness, no masses palpated.  Bowel sounds positive.  Musculoskeletal: no clubbing / cyanosis. No joint deformity upper and lower extremities. Good ROM, no contractures. Normal muscle tone.  Skin: no rashes, lesions, ulcers. No induration Neurologic: CN 2-12 grossly intact.   Psychiatric: Normal judgment and insight. Alert and oriented x 3. Normal mood. Data Reviewed:  See HPI  Assessment and Plan: * Hypotension SBP in the 90s on arrival.  Likely due to recurrent UTI.  Now up to SBP 140s with fluid hydration.  Continuous IV fluid overnight. - On Midodrine for hx of orthostatic hypotension.Will continue.  -Hold Lasix, spironolactone and diltiazem overnight  Nausea & vomiting -reports nausea with new constipation for 10 days even before her urinary symptoms -abdominal exam is benign but will check stat abd X-ray to evaluate  UTI (urinary  tract infection) -UA was positive for UTI with large leukocyte Estrace, positive nitrite.  -continue IV Rocephin pending urine culture  Stage 3b chronic kidney disease (CKD) (HCC) creatinine of 1.87 based on prior seems to be around her baseline -follow with  repeat  Chronic obstructive pulmonary disease (HCC) -stable with chronic respiratory failure on 2L  Type 2 diabetes mellitus ([place]) - Patient had HbA1C earlier this year of 5.9% -hold metformin -Hold SSI for now while on full liquid diet with her nausea/vomiting  Paroxysmal atrial fibrillation (HCC) -continue Eliquis and metoprolol  OSA (obstructive sleep apnea) Holding CPAP tonight with her active vomiting  Obesity BMI of 30  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 Asymptomatic from cardiac standpoint      Advance Care Planning:   Code Status: Full Code   Consults: none  Family Communication: none at bedside  Severity of Illness: The appropriate patient status for this patient is OBSERVATION. Observation status is judged to be reasonable and necessary in order to provide the required intensity of service to ensure the patient's safety. The patient's presenting symptoms, physical exam findings, and initial radiographic and laboratory data in the context of their medical condition is felt to place them at decreased risk for further clinical deterioration. Furthermore, it is anticipated that the patient will be medically stable for discharge from the hospital within 2 midnights of admission.   Author: Orene Desanctis, DO 10/01/2022 10:22 PM  For on call review [email or web address].

## 2022-10-02 DIAGNOSIS — R6510 Systemic inflammatory response syndrome (SIRS) of non-infectious origin without acute organ dysfunction: Secondary | ICD-10-CM | POA: Diagnosis not present

## 2022-10-02 DIAGNOSIS — I2510 Atherosclerotic heart disease of native coronary artery without angina pectoris: Secondary | ICD-10-CM | POA: Diagnosis present

## 2022-10-02 DIAGNOSIS — R112 Nausea with vomiting, unspecified: Secondary | ICD-10-CM | POA: Diagnosis present

## 2022-10-02 DIAGNOSIS — J449 Chronic obstructive pulmonary disease, unspecified: Secondary | ICD-10-CM | POA: Diagnosis not present

## 2022-10-02 DIAGNOSIS — J9611 Chronic respiratory failure with hypoxia: Secondary | ICD-10-CM | POA: Diagnosis present

## 2022-10-02 DIAGNOSIS — K5900 Constipation, unspecified: Secondary | ICD-10-CM | POA: Diagnosis present

## 2022-10-02 DIAGNOSIS — J4489 Other specified chronic obstructive pulmonary disease: Secondary | ICD-10-CM | POA: Diagnosis present

## 2022-10-02 DIAGNOSIS — R54 Age-related physical debility: Secondary | ICD-10-CM | POA: Diagnosis present

## 2022-10-02 DIAGNOSIS — I482 Chronic atrial fibrillation, unspecified: Secondary | ICD-10-CM | POA: Diagnosis present

## 2022-10-02 DIAGNOSIS — Z87891 Personal history of nicotine dependence: Secondary | ICD-10-CM | POA: Diagnosis not present

## 2022-10-02 DIAGNOSIS — E860 Dehydration: Secondary | ICD-10-CM | POA: Diagnosis present

## 2022-10-02 DIAGNOSIS — G4733 Obstructive sleep apnea (adult) (pediatric): Secondary | ICD-10-CM | POA: Diagnosis present

## 2022-10-02 DIAGNOSIS — Z1152 Encounter for screening for COVID-19: Secondary | ICD-10-CM | POA: Diagnosis not present

## 2022-10-02 DIAGNOSIS — N1832 Chronic kidney disease, stage 3b: Secondary | ICD-10-CM | POA: Diagnosis present

## 2022-10-02 DIAGNOSIS — I959 Hypotension, unspecified: Secondary | ICD-10-CM | POA: Diagnosis present

## 2022-10-02 DIAGNOSIS — I441 Atrioventricular block, second degree: Secondary | ICD-10-CM | POA: Diagnosis present

## 2022-10-02 DIAGNOSIS — Z66 Do not resuscitate: Secondary | ICD-10-CM | POA: Diagnosis present

## 2022-10-02 DIAGNOSIS — N390 Urinary tract infection, site not specified: Secondary | ICD-10-CM | POA: Diagnosis present

## 2022-10-02 DIAGNOSIS — I130 Hypertensive heart and chronic kidney disease with heart failure and stage 1 through stage 4 chronic kidney disease, or unspecified chronic kidney disease: Secondary | ICD-10-CM | POA: Diagnosis present

## 2022-10-02 DIAGNOSIS — I9589 Other hypotension: Secondary | ICD-10-CM | POA: Diagnosis not present

## 2022-10-02 DIAGNOSIS — N3000 Acute cystitis without hematuria: Secondary | ICD-10-CM | POA: Diagnosis not present

## 2022-10-02 DIAGNOSIS — I951 Orthostatic hypotension: Secondary | ICD-10-CM | POA: Diagnosis present

## 2022-10-02 DIAGNOSIS — I480 Paroxysmal atrial fibrillation: Secondary | ICD-10-CM | POA: Diagnosis present

## 2022-10-02 DIAGNOSIS — E1122 Type 2 diabetes mellitus with diabetic chronic kidney disease: Secondary | ICD-10-CM | POA: Diagnosis present

## 2022-10-02 DIAGNOSIS — E669 Obesity, unspecified: Secondary | ICD-10-CM | POA: Diagnosis present

## 2022-10-02 DIAGNOSIS — Z87440 Personal history of urinary (tract) infections: Secondary | ICD-10-CM | POA: Diagnosis not present

## 2022-10-02 DIAGNOSIS — I5032 Chronic diastolic (congestive) heart failure: Secondary | ICD-10-CM | POA: Diagnosis present

## 2022-10-02 DIAGNOSIS — E785 Hyperlipidemia, unspecified: Secondary | ICD-10-CM | POA: Diagnosis present

## 2022-10-02 DIAGNOSIS — Z9981 Dependence on supplemental oxygen: Secondary | ICD-10-CM | POA: Diagnosis not present

## 2022-10-02 LAB — URINALYSIS, ROUTINE W REFLEX MICROSCOPIC
Bilirubin Urine: NEGATIVE
Glucose, UA: NEGATIVE mg/dL
Hgb urine dipstick: NEGATIVE
Ketones, ur: NEGATIVE mg/dL
Nitrite: POSITIVE — AB
Protein, ur: 30 mg/dL — AB
Specific Gravity, Urine: 1.017 (ref 1.005–1.030)
WBC, UA: >50 WBC/hpf — ABNORMAL HIGH (ref 0–5)
pH: 5 (ref 5.0–8.0)

## 2022-10-02 LAB — BASIC METABOLIC PANEL
Anion gap: 10 (ref 5–15)
BUN: 36 mg/dL — ABNORMAL HIGH (ref 8–23)
CO2: 26 mmol/L (ref 22–32)
Calcium: 8.4 mg/dL — ABNORMAL LOW (ref 8.9–10.3)
Chloride: 104 mmol/L (ref 98–111)
Creatinine, Ser: 1.93 mg/dL — ABNORMAL HIGH (ref 0.44–1.00)
GFR, Estimated: 26 mL/min — ABNORMAL LOW (ref >60)
Glucose, Bld: 152 mg/dL — ABNORMAL HIGH (ref 70–99)
Potassium: 4.7 mmol/L (ref 3.5–5.1)
Sodium: 140 mmol/L (ref 135–145)

## 2022-10-02 LAB — CBC
HCT: 30 % — ABNORMAL LOW (ref 36.0–46.0)
Hemoglobin: 9.7 g/dL — ABNORMAL LOW (ref 12.0–15.0)
MCH: 30.1 pg (ref 26.0–34.0)
MCHC: 32.3 g/dL (ref 30.0–36.0)
MCV: 93.2 fL (ref 80.0–100.0)
Platelets: 351 10*3/uL (ref 150–400)
RBC: 3.22 MIL/uL — ABNORMAL LOW (ref 3.87–5.11)
RDW: 15.2 % (ref 11.5–15.5)
WBC: 12.4 10*3/uL — ABNORMAL HIGH (ref 4.0–10.5)
nRBC: 0 % (ref 0.0–0.2)

## 2022-10-02 LAB — CBG MONITORING, ED
Glucose-Capillary: 189 mg/dL — ABNORMAL HIGH (ref 70–99)
Glucose-Capillary: 97 mg/dL (ref 70–99)

## 2022-10-02 LAB — HEMOGLOBIN A1C
Hgb A1c MFr Bld: 6 % — ABNORMAL HIGH (ref 4.8–5.6)
Mean Plasma Glucose: 125.5 mg/dL

## 2022-10-02 MED ORDER — INSULIN ASPART 100 UNIT/ML IJ SOLN
0.0000 [IU] | Freq: Three times a day (TID) | INTRAMUSCULAR | Status: DC
  Administered 2022-10-02 – 2022-10-03 (×3): 2 [IU] via SUBCUTANEOUS
  Administered 2022-10-04 (×2): 1 [IU] via SUBCUTANEOUS
  Administered 2022-10-04 – 2022-10-06 (×4): 2 [IU] via SUBCUTANEOUS

## 2022-10-02 MED ORDER — MIDODRINE HCL 5 MG PO TABS
10.0000 mg | ORAL_TABLET | Freq: Once | ORAL | Status: AC
  Administered 2022-10-02: 10 mg via ORAL
  Filled 2022-10-02: qty 2

## 2022-10-02 MED ORDER — COSYNTROPIN 0.25 MG IJ SOLR
0.2500 mg | Freq: Once | INTRAMUSCULAR | Status: AC
  Administered 2022-10-03: 0.25 mg via INTRAVENOUS
  Filled 2022-10-02: qty 0.25

## 2022-10-02 MED ORDER — SODIUM CHLORIDE 0.9 % IV BOLUS
1000.0000 mL | Freq: Once | INTRAVENOUS | Status: AC
  Administered 2022-10-02: 1000 mL via INTRAVENOUS

## 2022-10-02 NOTE — ED Notes (Signed)
Chen DO paged regarding pts BP 88/46 following liter bolus. New orders given
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
## 2022-10-02 NOTE — Progress Notes (Addendum)
Attending MD note Patient was seen, examined,treatment plan was discussed with the NP Student.  I have personally reviewed the clinical findings, lab, imaging studies and management of this patient in detail. I agree with the documentation, as recorded by the NP Student.   Brief Summary: 81 year old with history of chronic HFpEF, PAF on Eliquis, Mobitz type II-s/p PPM placement-who several weeks ago was diagnosed with UTI along with hypotension-and was treated in the ED-stabilized and sent home with oral antimicrobial therapy.  She will follow-up with her PCP on 11/16-and was found to have hypotension-she also was complaining of recurrent dysuria/frequency of urination.  Her UA was consistent UTI-she was referred to a nearby ED-and subsequently admitted to the hospitalist service.  Subjective Feels better Continues to have dysuria/frequency of urination.   On Exam: Vitals:   10/02/22 0815 10/02/22 0830 10/02/22 0845 10/02/22 0900  BP: (!) 105/57 (!) 111/58 (!) 123/110 (!) 115/98  Pulse: 69 72 88 72  Resp: (!) 22 (!) '27 17 18  '$ Temp:      TempSrc:      SpO2: 97% 98% 97% 96%  Weight:      Height:       Gen. exam: Awake, alert, not in any distress Chest: Good air entry bilaterally, no rhonchi or rales CVS: S1-S2 regular, no murmurs Abdomen: Soft, nontender and nondistended Neurology: Non-focal Skin: No rash or lesions   Lab Data: CBC: Recent Labs  Lab 10/01/22 1612 10/02/22 0511  WBC 7.0 12.4*  NEUTROABS 4.9  --   HGB 10.7* 9.7*  HCT 34.0* 30.0*  MCV 95.8 93.2  PLT 390 885    Basic Metabolic Panel: Recent Labs  Lab 10/01/22 1612 10/02/22 0511  NA 139 140  K 4.8 4.7  CL 103 104  CO2 24 26  GLUCOSE 120* 152*  BUN 39* 36*  CREATININE 1.87* 1.93*  CALCIUM 8.9 8.4*    Assessment and Plan: Complicated UTI Symptoms improved-but still with dysuria/frequency UA at PCPs office suggestive of UTI Unfortunately urine culture sent this morning after patient had already  received a dose of Rocephin-hence may be sterile Follow cultures-mobilize  Hypotension Suspect multifactorial etiology-apparently has history of orthostatic hypotension-on numerous antihypertensives-and also had some nausea/vomiting for the past several days all contributing. BP better-continue midodrine-cautiously continue with antihypertensives Check ACTH stimulation test tomorrow morning given that this is now a recurrent issue.  Chronic HFpEF Euvolemic  PAF Metoprolol/Eliquis Telemetry monitoring  DM-2 Start SSI Hold oral hypoglycemics  CKD stage IIIb Baseline   Debility/deconditioning Appears frail Mobilize with PT/OT-determine appropriate disposition  Rest as below  Teacher, adult education [facility]                                 PROGRESS NOTE        PATIENT DETAILS Name: Brandi Hunter Age: 81 y.o. Sex: female Date of Birth: 07-Jan-1941 Admit Date: 10/01/2022 Admitting Physician Orene Desanctis, DO OYD:XAJOINOM, Helene Kelp, FNP  Brief Summary: Patient is a 81 y.o.  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  Pt was started on Midodrine due to orthostatic hypotension Sept 2023 presented to the ED with concerns of hypotension and UTI. Pt report she went to her PCP for a follow up visit on  a recent UTI diagnosis which was treated with Keflex for 5 days and hypotension. Pt report fatigue and developed new onset of dysuria, urgency, nausea without active vomiting and UA obtained in the office was positive for UTI. Pt denies hematuria, flank pain, or fevers at home. Pt endorses suprapubic pain, decreased appetite with weight loss. Pt report her urine was cloudy, and yellow color at home but denies odor. In the ED, pt BP was in the 90's with  lactic acid of 2.8 requiring IV fluid bolus and continuous hydration. Pt's UA positive for UTI with Leukocytes trace, Nitrite Positive and UA wbc greater than 50. Chest x-ray is negative. Pt is admitted for UTI and hypotension, pt was started on IV antibiotics (Rocephin).  Significant events: 11/16  >> Pt presented to the ED and admitted for UTI and hypotension  Significant studies: 11/16 >> CXR shows mildly decreased lung volumes. No focal airspace opacity.  11/16 >> Abd x-ray Portable shows small phleboliths noted within the left hemipelvis. Normal abdominal gas pattern.   Significant microbiology data: 11/16 >> Blood cultures: results pending (No growth) 11/17 >> Urine culture pending 11/17 >> UA positive for Nitrite, Leukocytes Trace, wbc >50 11/16 >> Lactic acid 1.5 11/17 >> Creatinine 1.93, BUN 36, GFR 26, WBC 12.4, Na 140, K 4.7  Procedures: None  Consults: None  Subjective: Pt Lying comfortably in stretcher, denies any chest pain or shortness of breath.  Objective: Vitals: Blood pressure 112/61, pulse 70, temperature 98.4 F (36.9 C), temperature source Oral, resp. rate 19, height '5\' 6"'$  (1.676 m), weight 85.3 kg, SpO2 98 %.   Exam: Gen Exam: Alert, pleasant, awake-not in any distress HEENT: atraumatic, normocephalic, neck supple Chest: B/L clear, diminished CVS: S1 S2 irregular, no murmur Abdomen: BS x4, obeses, soft non tender, non distended Extremities: +1 edema BLE Neurology: A&O x 4, Non focal Skin: warm to to touch, no rash  Pertinent Labs/Radiology:    Latest Ref Rng & Units 10/02/2022    5:11 AM 10/01/2022    4:12 PM 05/07/2020    6:37 AM  CBC  WBC 4.0 - 10.5 K/uL 12.4  7.0  8.6   Hemoglobin 12.0 - 15.0 g/dL 9.7  10.7  9.3   Hematocrit 36.0 - 46.0 % 30.0  34.0  31.1   Platelets 150 - 400 K/uL 351  390  352     Lab Results  Component Value Date   NA 140 10/02/2022   K 4.7 10/02/2022   CL 104 10/02/2022   CO2 26 10/02/2022     Assessment and  Plan: UTI (urinary tract infection) Pt admitted for suprapubic pain, dysuria, urgency, positive UA for UTI with large leukocyte Estrace, and positive nitrite. Pt stable  -continue IV Rocephin  - pending urine and blood cultures - encourage adequate fluid intake  Hypotension Pt has history of orthostatic hypotension and is on midodrine since Sept 2023. Pt SBP was in the 90s, more likely due to recurrent UTI. BP stable after IV fluid hydration. - Continue Midodrine   - continue metoprolol with parameters  - Hold Lasix, spironolactone and diltiazem and reassess BP - discontinue IV fluids  Nausea & vomiting (resolving) Pt denies nausea or vomiting this am.  - abdominal exam is benign  - abd X-ray with no acute findings, Normal abdominal gas pattern.  - continue prn Zofran  Stage 3b chronic kidney disease (CKD) ([place]) Creatinine based on prior seems to be around her baseline - creatinine 1.93 today, encourage adequate po fluid  intake - repeat CMP in am - avoid nephrotoxins  Chronic obstructive pulmonary disease (HCC) -stable with chronic respiratory failure on 2L, pt denies any acute respiratory distress   Type 2 diabetes mellitus ([place]) Patient had HbA1C earlier this year of 5.9% - hold metformin - start SSI for now and continue on full liquid diet  - monitor for nausea/vomiting  Paroxysmal atrial fibrillation (HCC) Rate controlled on the monitor - continue Eliquis and Metoprolol with parameters - continue to hold diltiazem   OSA (obstructive sleep apnea) No active vomiting - Resume CPAP tonight   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 Pt denies any chest pain or discomfort, Asymptomatic from cardiac standpoint - continue cardiac monitoring  BMI/Obesity: Estimated body mass index is 30.35 kg/m as calculated from the following:   Height as of this encounter: '5\' 6"'$  (1.676 m).   Weight as of this encounter: 85.3 kg.   Code status:    Code Status: Full Code   DVT Prophylaxis:  apixaban (ELIQUIS) tablet 5 mg    Family Communication: None at bedside   Disposition Plan: Status is: Observation The patient will require care spanning > 2 midnights and should be moved to inpatient because: UTI on IV antibiotics-follow cultures-await mobilization with PT/OT before consideration of discharge.   Planned Discharge Destination:Home  Diet: Diet Order             Diet full liquid Room service appropriate? Yes; Fluid consistency: Thin  Diet effective now                   Antimicrobial agents: Anti-infectives (From admission, onward)    Start     Dose/Rate Route Frequency Ordered Stop   10/01/22 1615  cefTRIAXone (ROCEPHIN) 2 g in sodium chloride 0.9 % 100 mL IVPB        2 g 200 mL/hr over 30 Minutes Intravenous Every 24 hours 10/01/22 1613 10/08/22 1614      MEDICATIONS: Scheduled Meds:  apixaban  5 mg Oral BID   atorvastatin  40 mg Oral QHS   DULoxetine  30 mg Oral QHS   famotidine  20 mg Oral Daily   fenofibrate  160 mg Oral QPM   gabapentin  200 mg Oral QHS   isosorbide mononitrate  30 mg Oral BID   metoprolol succinate  100 mg Oral Daily   midodrine  5 mg Oral BID WC   mometasone-formoterol  2 puff Inhalation BID   Continuous Infusions:  cefTRIAXone (ROCEPHIN)  IV Stopped (10/01/22 1701)   lactated ringers 75 mL/hr at 10/02/22 0809   PRN Meds:.acetaminophen, albuterol, LORazepam, ondansetron (ZOFRAN) IV   I have personally reviewed following labs and imaging studies  LABORATORY DATA: CBC: Recent Labs  Lab 10/01/22 1612 10/02/22 0511  WBC 7.0 12.4*  NEUTROABS 4.9  --   HGB 10.7* 9.7*  HCT 34.0* 30.0*  MCV 95.8 93.2  PLT 390 161    Basic Metabolic Panel: Recent Labs  Lab 10/01/22 1612 10/02/22 0511  NA 139 140  K 4.8 4.7  CL 103 104  CO2 24 26  GLUCOSE 120* 152*  BUN 39* 36*  CREATININE 1.87* 1.93*  CALCIUM 8.9 8.4*    GFR: Estimated Creatinine Clearance: 26 mL/min (A)  (by C-G formula based on SCr of 1.93 mg/dL (H)).  Liver Function Tests: Recent Labs  Lab 10/01/22 1612  AST 28  ALT 15  ALKPHOS 50  BILITOT 0.7  PROT 5.7*  ALBUMIN 2.7*   No  results for input(s): "LIPASE", "AMYLASE" in the last 168 hours. No results for input(s): "AMMONIA" in the last 168 hours.  Coagulation Profile: Recent Labs  Lab 10/01/22 1612  INR 2.0*    Cardiac Enzymes: No results for input(s): "CKTOTAL", "CKMB", "CKMBINDEX", "TROPONINI" in the last 168 hours.  BNP (last 3 results) No results for input(s): "PROBNP" in the last 8760 hours.  Lipid Profile: No results for input(s): "CHOL", "HDL", "LDLCALC", "TRIG", "CHOLHDL", "LDLDIRECT" in the last 72 hours.  Thyroid Function Tests: No results for input(s): "TSH", "T4TOTAL", "FREET4", "T3FREE", "THYROIDAB" in the last 72 hours.  Anemia Panel: No results for input(s): "VITAMINB12", "FOLATE", "FERRITIN", "TIBC", "IRON", "RETICCTPCT" in the last 72 hours.  Urine analysis:    Component Value Date/Time   COLORURINE AMBER (A) 10/02/2022 0640   APPEARANCEUR HAZY (A) 10/02/2022 0640   LABSPEC 1.017 10/02/2022 0640   PHURINE 5.0 10/02/2022 0640   GLUCOSEU NEGATIVE 10/02/2022 0640   HGBUR NEGATIVE 10/02/2022 0640   BILIRUBINUR NEGATIVE 10/02/2022 0640   KETONESUR NEGATIVE 10/02/2022 0640   PROTEINUR 30 (A) 10/02/2022 0640   UROBILINOGEN 1.0 05/11/2011 2020   NITRITE POSITIVE (A) 10/02/2022 0640   LEUKOCYTESUR TRACE (A) 10/02/2022 0640    Sepsis Labs: Lactic Acid, Venous    Component Value Date/Time   LATICACIDVEN 1.5 10/01/2022 1811    MICROBIOLOGY: Recent Results (from the past 240 hour(s))  Resp Panel by RT-PCR (Flu A&B, Covid) Anterior Nasal Swab     Status: None   Collection Time: 10/01/22  4:12 PM   Specimen: Anterior Nasal Swab  Result Value Ref Range Status   SARS Coronavirus 2 by RT PCR NEGATIVE NEGATIVE Final    Comment: (NOTE) SARS-CoV-2 target nucleic acids are NOT DETECTED.  The  SARS-CoV-2 RNA is generally detectable in upper respiratory specimens during the acute phase of infection. The lowest concentration of SARS-CoV-2 viral copies this assay can detect is 138 copies/mL. A negative result does not preclude SARS-Cov-2 infection and should not be used as the sole basis for treatment or other patient management decisions. A negative result may occur with  improper specimen collection/handling, submission of specimen other than nasopharyngeal swab, presence of viral mutation(s) within the areas targeted by this assay, and inadequate number of viral copies(<138 copies/mL). A negative result must be combined with clinical observations, patient history, and epidemiological information. The expected result is Negative.  Fact Sheet for Patients:  EntrepreneurPulse.com.au  Fact Sheet for Healthcare Providers:  IncredibleEmployment.be  This test is no t yet approved or cleared by the [place] FDA and  has been authorized for detection and/or diagnosis of SARS-CoV-2 by FDA under an Emergency Use Authorization (EUA). This EUA will remain  in effect (meaning this test can be used) for the duration of the COVID-19 declaration under Section 564(b)(1) of the Act, 21 U.S.C.section 360bbb-3(b)(1), unless the authorization is terminated  or revoked sooner.       Influenza A by PCR NEGATIVE NEGATIVE Final   Influenza B by PCR NEGATIVE NEGATIVE Final    Comment: (NOTE) The Xpert Xpress SARS-CoV-2/FLU/RSV plus assay is intended as an aid in the diagnosis of influenza from Nasopharyngeal swab specimens and should not be used as a sole basis for treatment. Nasal washings and aspirates are unacceptable for Xpert Xpress SARS-CoV-2/FLU/RSV testing.  Fact Sheet for Patients: EntrepreneurPulse.com.au  Fact Sheet for Healthcare Providers: IncredibleEmployment.be  This test is not yet approved or  cleared by the [place] FDA and has been authorized for detection and/or diagnosis of SARS-CoV-2  by FDA under an Emergency Use Authorization (EUA). This EUA will remain in effect (meaning this test can be used) for the duration of the COVID-19 declaration under Section 564(b)(1) of the Act, 21 U.S.C. section 360bbb-3(b)(1), unless the authorization is terminated or revoked.  Performed at [facility], [place] [address], [place], [place] [address]   Blood Culture (routine x 2)     Status: None (Preliminary result)   Collection Time: 10/01/22  4:12 PM   Specimen: BLOOD  Result Value Ref Range Status   Specimen Description BLOOD SITE NOT SPECIFIED  Final   Special Requests   Final    BOTTLES DRAWN AEROBIC ONLY Blood Culture adequate volume   Culture   Final    NO GROWTH < 24 HOURS Performed at [facility], [place] [address], [place], [place] [address]    Report Status PENDING  Incomplete  Blood Culture (routine x 2)     Status: None (Preliminary result)   Collection Time: 10/01/22  4:17 PM   Specimen: BLOOD  Result Value Ref Range Status   Specimen Description BLOOD SITE NOT SPECIFIED  Final   Special Requests   Final    BOTTLES DRAWN AEROBIC AND ANAEROBIC Blood Culture adequate volume   Culture   Final    NO GROWTH < 24 HOURS Performed at [facility], [place] [address] [place], [place] [address]    Report Status PENDING  Incomplete    RADIOLOGY STUDIES/RESULTS: DG Abd Portable 1V  Result Date: 10/01/2022 CLINICAL DATA:  Intractable nausea and vomiting EXAM: PORTABLE ABDOMEN - 1 VIEW COMPARISON:  None Available. FINDINGS: Normal abdominal gas pattern. No free intraperitoneal gas. No organomegaly. No nephro or urolithiasis. Small phleboliths noted within the left hemipelvis. Osseous structures are age-appropriate. IMPRESSION: 1. Normal abdominal gas pattern. Electronically Signed   By: Fidela Salisbury M.D.   On: 10/01/2022 22:46   DG Chest Port 1 View  Result  Date: 10/01/2022 CLINICAL DATA:  Questionable sepsis.  Evaluate for abnormality. EXAM: PORTABLE CHEST 1 VIEW COMPARISON:  AP chest 05/05/2020 FINDINGS: Cardiac silhouette is again moderately enlarged. Right chest wall cardiac pacer with leads overlying the right atrium and right ventricle, similar to prior. Mediastinal contours are within normal limits. Mild calcification within the aortic arch. Mildly decreased lung volumes. No focal airspace opacity. No pulmonary edema, pleural effusion, or pneumothorax. No acute skeletal abnormality. IMPRESSION: 1. Mildly decreased lung volumes. No focal airspace opacity. 2. Cardiomegaly. Electronically Signed   By: Yvonne Kendall M.D.   On: 10/01/2022 16:32     LOS: 0 days   Tanda Rockers, AGACNP Student   [facility]   10/02/2022, 9:20 AM

## 2022-10-02 NOTE — ED Notes (Signed)
Pt assisted to bedside commode. This RN unable to obtain urine sample due to pt having bowel movement

## 2022-10-02 NOTE — ED Notes (Signed)
Tu DO paged regarding pts BP 94/37. New orders given

## 2022-10-03 DIAGNOSIS — J449 Chronic obstructive pulmonary disease, unspecified: Secondary | ICD-10-CM | POA: Diagnosis not present

## 2022-10-03 DIAGNOSIS — N1832 Chronic kidney disease, stage 3b: Secondary | ICD-10-CM | POA: Diagnosis not present

## 2022-10-03 DIAGNOSIS — N3000 Acute cystitis without hematuria: Secondary | ICD-10-CM | POA: Diagnosis not present

## 2022-10-03 DIAGNOSIS — I441 Atrioventricular block, second degree: Secondary | ICD-10-CM | POA: Diagnosis not present

## 2022-10-03 LAB — URINE CULTURE: Culture: NO GROWTH

## 2022-10-03 LAB — ACTH STIMULATION, 3 TIME POINTS
Cortisol, 30 Min: 23.7 ug/dL
Cortisol, 60 Min: 26.5 ug/dL
Cortisol, Base: 9.1 ug/dL

## 2022-10-03 LAB — CBC
HCT: 28.5 % — ABNORMAL LOW (ref 36.0–46.0)
Hemoglobin: 9.4 g/dL — ABNORMAL LOW (ref 12.0–15.0)
MCH: 30.5 pg (ref 26.0–34.0)
MCHC: 33 g/dL (ref 30.0–36.0)
MCV: 92.5 fL (ref 80.0–100.0)
Platelets: 326 10*3/uL (ref 150–400)
RBC: 3.08 MIL/uL — ABNORMAL LOW (ref 3.87–5.11)
RDW: 15.3 % (ref 11.5–15.5)
WBC: 8.8 10*3/uL (ref 4.0–10.5)
nRBC: 0 % (ref 0.0–0.2)

## 2022-10-03 LAB — BASIC METABOLIC PANEL
Anion gap: 7 (ref 5–15)
BUN: 31 mg/dL — ABNORMAL HIGH (ref 8–23)
CO2: 26 mmol/L (ref 22–32)
Calcium: 8.6 mg/dL — ABNORMAL LOW (ref 8.9–10.3)
Chloride: 104 mmol/L (ref 98–111)
Creatinine, Ser: 1.72 mg/dL — ABNORMAL HIGH (ref 0.44–1.00)
GFR, Estimated: 30 mL/min — ABNORMAL LOW (ref >60)
Glucose, Bld: 138 mg/dL — ABNORMAL HIGH (ref 70–99)
Potassium: 4.5 mmol/L (ref 3.5–5.1)
Sodium: 137 mmol/L (ref 135–145)

## 2022-10-03 LAB — GLUCOSE, CAPILLARY
Glucose-Capillary: 186 mg/dL — ABNORMAL HIGH (ref 70–99)
Glucose-Capillary: 160 mg/dL — ABNORMAL HIGH (ref 70–99)
Glucose-Capillary: 199 mg/dL — ABNORMAL HIGH (ref 70–99)
Glucose-Capillary: 262 mg/dL — ABNORMAL HIGH (ref 70–99)

## 2022-10-03 MED ORDER — ALBUTEROL SULFATE HFA 108 (90 BASE) MCG/ACT IN AERS: 2.0000 | INHALATION_SPRAY | RESPIRATORY_TRACT | Status: DC | PRN

## 2022-10-03 NOTE — Evaluation (Signed)
Physical Therapy Evaluation Patient Details Name: Brandi Hunter MRN: [id number] DOB: 07/01/1941 Today's Date: 10/03/2022  History of Present Illness  Pt is a 81 y/o female admitted secondary to complicated UTI and hypotension. PMH includes DM, a fib, and COPD.  Clinical Impression  Pt admitted secondary to problem above with deficits below. Instability noted without use of AD and required min A for mobility tasks. Educated about using RW at home to increase safety. Discussed HHPT, but pt reports she may want to pursue outpatient PT instead. Reports she would like to think about it before making a decision. Would benefit from some form of PT at d/c to address balance deficits. Will continue to follow acutely.        Recommendations for follow up therapy are one component of a multi-disciplinary discharge planning process, led by the attending physician.  Recommendations may be updated based on patient status, additional functional criteria and insurance authorization.  Follow Up Recommendations Other (comment) (HHPT vs outpatient PT)      Assistance Recommended at Discharge Intermittent Supervision/Assistance  Patient can return home with the following  A little help with walking and/or transfers;Assistance with cooking/housework;Assist for transportation    Equipment Recommendations None recommended by PT  Recommendations for Other Services       Functional Status Assessment Patient has had a recent decline in their functional status and demonstrates the ability to make significant improvements in function in a reasonable and predictable amount of time.     Precautions / Restrictions Precautions Precautions: Fall Restrictions Weight Bearing Restrictions: No      Mobility  Bed Mobility Overal bed mobility: Needs Assistance Bed Mobility: Supine to Sit, Sit to Supine     Supine to sit: Supervision Sit to supine: Supervision   General bed mobility comments: Supervision for  safety '    Transfers Overall transfer level: Needs assistance Equipment used: None Transfers: Sit to/from Stand Sit to Stand: Min assist           General transfer comment: Min A for steadying assist to stand.    Ambulation/Gait Ambulation/Gait assistance: Min assist Gait Distance (Feet): 30 Feet Assistive device: None Gait Pattern/deviations: Step-through pattern, Decreased stride length Gait velocity: decreased     General Gait Details: Pt reaching for objects in room to hold for support. Min A for steadying. Educated about using RW at home to increase safety and prevent falls.  Stairs            Wheelchair Mobility    Modified Rankin (Stroke Patients Only)       Balance Overall balance assessment: Needs assistance Sitting-balance support: No upper extremity supported, Feet supported Sitting balance-Leahy Scale: Good     Standing balance support: No upper extremity supported Standing balance-Leahy Scale: Fair                               Pertinent Vitals/Pain Pain Assessment Pain Assessment: No/denies pain    Home Living Family/patient expects to be discharged to:: Private residence Living Arrangements: Children Available Help at Discharge: Family;Available 24 hours/day Type of Home: House Home Access: Stairs to enter Entrance Stairs-Rails: Chemical engineer of Steps: 5   Home Layout: One level Home Equipment: Shower seat - built Agricultural consultant (2 wheels);Rollator (4 wheels);Wheelchair - manual      Prior Function Prior Level of Function : Independent/Modified Independent  Hand Dominance        Extremity/Trunk Assessment   Upper Extremity Assessment Upper Extremity Assessment: Defer to OT evaluation    Lower Extremity Assessment Lower Extremity Assessment: Generalized weakness    Cervical / Trunk Assessment Cervical / Trunk Assessment: Kyphotic  Communication    Communication: No difficulties  Cognition Arousal/Alertness: Awake/alert Behavior During Therapy: WFL for tasks assessed/performed Overall Cognitive Status: Within Functional Limits for tasks assessed                                          General Comments General comments (skin integrity, edema, etc.): Discussed HHPT vs outpatient and pt reports she would like to think about it before deciding.    Exercises     Assessment/Plan    PT Assessment Patient needs continued PT services  PT Problem List Decreased strength;Decreased activity tolerance;Decreased balance;Decreased mobility       PT Treatment Interventions DME instruction;Gait training;Stair training;Functional mobility training;Balance training;Therapeutic exercise;Therapeutic activities;Patient/family education    PT Goals (Current goals can be found in the Care Plan section)  Acute Rehab PT Goals Patient Stated Goal: to stop falling PT Goal Formulation: With patient Time For Goal Achievement: 10/17/22 Potential to Achieve Goals: Fair    Frequency Min 3X/week     Co-evaluation               AM-PAC PT "6 Clicks" Mobility  Outcome Measure Help needed turning from your back to your side while in a flat bed without using bedrails?: None Help needed moving from lying on your back to sitting on the side of a flat bed without using bedrails?: A Little Help needed moving to and from a bed to a chair (including a wheelchair)?: A Little Help needed standing up from a chair using your arms (e.g., wheelchair or bedside chair)?: A Little Help needed to walk in hospital room?: A Little Help needed climbing 3-5 steps with a railing? : A Little 6 Click Score: 19    End of Session Equipment Utilized During Treatment: Gait belt Activity Tolerance: Patient tolerated treatment well Patient left: in bed;with call bell/phone within reach Nurse Communication: Mobility status PT Visit Diagnosis:  Unsteadiness on feet (R26.81);Muscle weakness (generalized) (M62.81)    Time: 6286-3817 PT Time Calculation (min) (ACUTE ONLY): 17 min   Charges:   PT Evaluation $PT Eval Low Complexity: 1 Low          Reuel Derby, PT, DPT  Acute Rehabilitation Services  Office: [phone]   Rudean Hitt 10/03/2022, 8:59 AM
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
## 2022-10-03 NOTE — Evaluation (Signed)
Occupational Therapy Evaluation Patient Details Name: Brandi Hunter MRN: [id number] DOB: Jun 21, 1941 Today's Date: 10/03/2022   History of Present Illness Pt is a 81 y/o female admitted secondary to complicated UTI and hypotension. PMH includes DM, a fib, and COPD.   Clinical Impression   Patient admitted for the diagnosis above.  She is very close to her baseline, and has assist as needed from her family.  No OT needs exist in the acute setting.  No post acute OT is anticipated.    BP supine: 127/99 Sit: 132/75 Stand: 96/75  No dizziness noted.  Patient has the needed DME at home.        Recommendations for follow up therapy are one component of a multi-disciplinary discharge planning process, led by the attending physician.  Recommendations may be updated based on patient status, additional functional criteria and insurance authorization.   Follow Up Recommendations  No OT follow up     Assistance Recommended at Discharge PRN  Patient can return home with the following Assist for transportation    Functional Status Assessment  Patient has not had a recent decline in their functional status  Equipment Recommendations  None recommended by OT    Recommendations for Other Services       Precautions / Restrictions Precautions Precautions: Fall Restrictions Weight Bearing Restrictions: No      Mobility Bed Mobility Overal bed mobility: Modified Independent                  Transfers Overall transfer level: Needs assistance Equipment used: None Transfers: Sit to/from Stand Sit to Stand: Supervision                  Balance Overall balance assessment: Needs assistance Sitting-balance support: No upper extremity supported, Feet supported Sitting balance-Leahy Scale: Normal     Standing balance support: No upper extremity supported Standing balance-Leahy Scale: Fair                             ADL either performed or assessed with  clinical judgement   ADL Overall ADL's : At baseline                                             Vision Patient Visual Report: No change from baseline       Perception     Praxis      Pertinent Vitals/Pain Pain Assessment Pain Assessment: No/denies pain     Hand Dominance Right   Extremity/Trunk Assessment Upper Extremity Assessment Upper Extremity Assessment: Overall WFL for tasks assessed   Lower Extremity Assessment Lower Extremity Assessment: Defer to PT evaluation   Cervical / Trunk Assessment Cervical / Trunk Assessment: Kyphotic   Communication Communication Communication: No difficulties   Cognition Arousal/Alertness: Awake/alert Behavior During Therapy: WFL for tasks assessed/performed Overall Cognitive Status: Within Functional Limits for tasks assessed                                       General Comments      Exercises     Shoulder Instructions      Home Living Family/patient expects to be discharged to:: Private residence Living Arrangements: Children Available Help at Discharge: Family;Available 24 hours/day Type of  Home: House Home Access: Stairs to enter [employer] of Steps: 5 Entrance Stairs-Rails: Left;Right Home Layout: One level     Bathroom Shower/Tub: Occupational psychologist: Standard Bathroom Accessibility: Yes How Accessible: Accessible via walker Home Equipment: [place] in;BSC/3in1;Rolling Environmental consultant (2 wheels);Rollator (4 wheels);Wheelchair - manual          Prior Functioning/Environment Prior Level of Function : Independent/Modified Independent               ADLs Comments: Uses home O2, RW for community mobility.  Cares for her own ADL        OT Problem List: Decreased activity tolerance      OT Treatment/Interventions:      OT Goals(Current goals can be found in the care plan section) Acute Rehab OT Goals Patient Stated Goal: Return  home OT Goal Formulation: With patient Time For Goal Achievement: 10/05/22 Potential to Achieve Goals: Good  OT Frequency:      Co-evaluation              AM-PAC OT "6 Clicks" Daily Activity     Outcome Measure Help from another person eating meals?: None Help from another person taking care of personal grooming?: None Help from another person toileting, which includes using toliet, bedpan, or urinal?: A Little Help from another person bathing (including washing, rinsing, drying)?: A Little Help from another person to put on and taking off regular upper body clothing?: None Help from another person to put on and taking off regular lower body clothing?: A Little 6 Click Score: 21   End of Session Equipment Utilized During Treatment: Oxygen Nurse Communication: Mobility status  Activity Tolerance: Patient tolerated treatment well Patient left: in bed;with call bell/phone within reach;with nursing/sitter in room  OT Visit Diagnosis: Unsteadiness on feet (R26.81)                Time: 1007-1219 OT Time Calculation (min): 18 min Charges:  OT General Charges $OT Visit: 1 Visit OT Evaluation $OT Eval Moderate Complexity: 1 Mod  10/03/2022  RP, OTR/L  Acute Rehabilitation Services  Office:  [phone]   Metta Clines 10/03/2022, 10:45 AM

## 2022-10-04 DIAGNOSIS — I951 Orthostatic hypotension: Secondary | ICD-10-CM

## 2022-10-04 DIAGNOSIS — I959 Hypotension, unspecified: Secondary | ICD-10-CM | POA: Diagnosis not present

## 2022-10-04 DIAGNOSIS — I480 Paroxysmal atrial fibrillation: Secondary | ICD-10-CM | POA: Diagnosis not present

## 2022-10-04 DIAGNOSIS — N3000 Acute cystitis without hematuria: Secondary | ICD-10-CM | POA: Diagnosis not present

## 2022-10-04 DIAGNOSIS — I9589 Other hypotension: Secondary | ICD-10-CM | POA: Diagnosis not present

## 2022-10-04 LAB — BASIC METABOLIC PANEL
Anion gap: 8 (ref 5–15)
BUN: 28 mg/dL — ABNORMAL HIGH (ref 8–23)
CO2: 27 mmol/L (ref 22–32)
Calcium: 8.7 mg/dL — ABNORMAL LOW (ref 8.9–10.3)
Chloride: 101 mmol/L (ref 98–111)
Creatinine, Ser: 1.52 mg/dL — ABNORMAL HIGH (ref 0.44–1.00)
GFR, Estimated: 35 mL/min — ABNORMAL LOW (ref >60)
Glucose, Bld: 179 mg/dL — ABNORMAL HIGH (ref 70–99)
Potassium: 4.7 mmol/L (ref 3.5–5.1)
Sodium: 136 mmol/L (ref 135–145)

## 2022-10-04 LAB — GLUCOSE, CAPILLARY
Glucose-Capillary: 147 mg/dL — ABNORMAL HIGH (ref 70–99)
Glucose-Capillary: 213 mg/dL — ABNORMAL HIGH (ref 70–99)
Glucose-Capillary: 167 mg/dL — ABNORMAL HIGH (ref 70–99)
Glucose-Capillary: 143 mg/dL — ABNORMAL HIGH (ref 70–99)
Glucose-Capillary: 171 mg/dL — ABNORMAL HIGH (ref 70–99)

## 2022-10-04 MED ORDER — ISOSORBIDE MONONITRATE ER 30 MG PO TB24: 15.0000 mg | ORAL_TABLET | Freq: Two times a day (BID) | ORAL | Status: DC

## 2022-10-04 NOTE — Progress Notes (Signed)
[facility] PROGRESS NOTE    Progress Note  Brandi Hunter  AYT:[id number] DOB: 1941-07-23 DOA: 10/01/2022 PCP: [name], FNP     Brief Narrative:   Brandi Hunter is an 81 y.o. female past medical history of chronic diastolic heart failure paroxysmal atrial fibrillation on Eliquis, Mobitz type II status post pacemaker placement several weeks ago comes in with hypotension along with a UTI treated with IV fluids and oral antibiotics, subsequently follow-up with PCP when he was found hypotensive again, in the ED was fluid resuscitated started empirically on antibiotics    Assessment/Plan:   Complicated UTI: Who has failed outpatient treatment. He was previously on antibiotics urine culture are negative. He was started on IV antibiotics on admission, since he responded to IV Rocephin we will continue this for total 5 days.  Hypotension: Multifactorial with a history of orthostatic hypotension nausea and vomiting for several days. ACTH stimulation test done 10/03/2022 not consistent with adrenal insufficiency. She has a history of orthostatic hypotension and she is on Imdur discontinue Imdur.  History of orthostatic hypotension: Continue midodrine.  Essential hypertension: Difficult situation she is on midodrine for orthostatic hypotension and is on metoprolol and Imdur. We will start weaning off Imdur.  Chronic diastolic heart failure: Appears to be euvolemic.  Paroxysmal atrial fibrillation: Continue Eliquis and metoprolol no events on telemetry.  Mobitz type II heart block/pacemaker placement: Noted.  Hyperlipidemia:  Continue statins.  COPD/chronic hypoxic respiratory failure: Continue current oxygen level and continue inhalers.  Diabetes mellitus type 2: With an A1c of 6.0. Blood glucose relatively well controlled with minimal insulin continue current regimen.   DVT prophylaxis: Eliquis Family Communication:none Status is: Inpatient Remains inpatient  appropriate because: Complicated UTI    Code Status:     Code Status Orders  (From admission, onward)           Start     Ordered   10/01/22 2211  Full code  Continuous        10/01/22 2212           Code Status History     Date Active Date Inactive Code Status Order ID Comments User Context   05/05/2020 2220 05/07/2020 1849 Full Code [id number]  Reubin Milan, MD ED   05/05/2020 1937 05/05/2020 2220 Full Code [id number]  Reubin Milan, MD ED   08/29/2019 2254 08/31/2019 1757 Full Code [id number]  Bethena Roys, MD ED   07/18/2018 1934 07/20/2018 1657 Full Code [id number]  Reubin Milan, MD ED   07/18/2018 1934 07/18/2018 1934 Full Code [id number]  Reubin Milan, MD ED   06/30/2018 1743 07/02/2018 1757 Full Code [id number]  Charlynne Cousins, MD Inpatient   03/23/2018 1426 03/25/2018 1712 DNR [id number]  [name], NP ED   03/23/2018 1023 03/23/2018 1425 Full Code [id number]  [name], NP ED   09/26/2017 1517 09/28/2017 1840 DNR [id number]  Murlean Iba, MD Inpatient   01/08/2015 1405 01/10/2015 1524 DNR [id number]  Caren Griffins, MD Inpatient   01/08/2015 0117 01/08/2015 1405 Full Code [id number]  Eber Jones, MD Inpatient         IV Access:   Peripheral IV   Procedures and diagnostic studies:   No results found.   Medical Consultants:   None.   Subjective:    Brandi Hunter feels better no complaints  Objective:    Vitals:   10/03/22 1031 10/03/22 1610 10/03/22 2029 10/03/22 2059  BP: (!) 127/99  126/61  (!) 143/77  Pulse: 71 70 72 75  Resp:  '18 19 18  '$ Temp:  98.5 F (36.9 C)  98.5 F (36.9 C)  TempSrc:  Oral  Oral  SpO2: 99% 97% 98% 97%  Weight:      Height:       SpO2: 97 % O2 Flow Rate (L/min): 2 L/min   Intake/Output Summary (Last 24 hours) at 10/04/2022 0813 Last data filed at 10/03/2022 1800 Gross per 24 hour  Intake 902.29 ml  Output --  Net 902.29 ml   Filed Weights   10/01/22 1612   Weight: 85.3 kg    Exam: General exam: In no acute distress. Respiratory system: Good air movement and clear to auscultation. Cardiovascular system: S1 & S2 heard, RRR. No JVD. Gastrointestinal system: Abdomen is nondistended, soft and nontender.  Extremities: No pedal edema. Skin: No rashes, lesions or ulcers Psychiatry: Judgement and insight appear normal. Mood & affect appropriate.    Data Reviewed:    Labs: Basic Metabolic Panel: Recent Labs  Lab 10/01/22 1612 10/02/22 0511 10/03/22 0622 10/04/22 0208  NA 139 140 137 136  K 4.8 4.7 4.5 4.7  CL 103 104 104 101  CO2 '24 26 26 27  '$ GLUCOSE 120* 152* 138* 179*  BUN 39* 36* 31* 28*  CREATININE 1.87* 1.93* 1.72* 1.52*  CALCIUM 8.9 8.4* 8.6* 8.7*   GFR Estimated Creatinine Clearance: 33 mL/min (A) (by C-G formula based on SCr of 1.52 mg/dL (H)). Liver Function Tests: Recent Labs  Lab 10/01/22 1612  AST 28  ALT 15  ALKPHOS 50  BILITOT 0.7  PROT 5.7*  ALBUMIN 2.7*   No results for input(s): "LIPASE", "AMYLASE" in the last 168 hours. No results for input(s): "AMMONIA" in the last 168 hours. Coagulation profile Recent Labs  Lab 10/01/22 1612  INR 2.0*   COVID-19 Labs  No results for input(s): "DDIMER", "FERRITIN", "LDH", "CRP" in the last 72 hours.  Lab Results  Component Value Date   SARSCOV2NAA NEGATIVE 10/01/2022   [place] NEGATIVE 05/05/2020   [place] Not Detected 12/22/2019   [place] NEGATIVE 08/29/2019    CBC: Recent Labs  Lab 10/01/22 1612 10/02/22 0511 10/03/22 0622  WBC 7.0 12.4* 8.8  NEUTROABS 4.9  --   --   HGB 10.7* 9.7* 9.4*  HCT 34.0* 30.0* 28.5*  MCV 95.8 93.2 92.5  PLT 390 351 326   Cardiac Enzymes: No results for input(s): "CKTOTAL", "CKMB", "CKMBINDEX", "TROPONINI" in the last 168 hours. BNP (last 3 results) No results for input(s): "PROBNP" in the last 8760 hours. CBG: Recent Labs  Lab 10/03/22 0829 10/03/22 1157 10/03/22 1608 10/03/22 2100 10/04/22 0723   GLUCAP 186* 160* 199* 262* 147*   D-Dimer: No results for input(s): "DDIMER" in the last 72 hours. Hgb A1c: Recent Labs    10/02/22 1952  HGBA1C 6.0*   Lipid Profile: No results for input(s): "CHOL", "HDL", "LDLCALC", "TRIG", "CHOLHDL", "LDLDIRECT" in the last 72 hours. Thyroid function studies: No results for input(s): "TSH", "T4TOTAL", "T3FREE", "THYROIDAB" in the last 72 hours.  Invalid input(s): "FREET3" Anemia work up: No results for input(s): "VITAMINB12", "FOLATE", "FERRITIN", "TIBC", "IRON", "RETICCTPCT" in the last 72 hours. Sepsis Labs: Recent Labs  Lab 10/01/22 1611 10/01/22 1612 10/01/22 1811 10/02/22 0511 10/03/22 0622  WBC  --  7.0  --  12.4* 8.8  LATICACIDVEN 2.8*  --  1.5  --   --    Microbiology Recent Results (from the past 240 hour(s))  Resp Panel by  RT-PCR (Flu A&B, Covid) Anterior Nasal Swab     Status: None   Collection Time: 10/01/22  4:12 PM   Specimen: Anterior Nasal Swab  Result Value Ref Range Status   SARS Coronavirus 2 by RT PCR NEGATIVE NEGATIVE Final    Comment: (NOTE) SARS-CoV-2 target nucleic acids are NOT DETECTED.  The SARS-CoV-2 RNA is generally detectable in upper respiratory specimens during the acute phase of infection. The lowest concentration of SARS-CoV-2 viral copies this assay can detect is 138 copies/mL. A negative result does not preclude SARS-Cov-2 infection and should not be used as the sole basis for treatment or other patient management decisions. A negative result may occur with  improper specimen collection/handling, submission of specimen other than nasopharyngeal swab, presence of viral mutation(s) within the areas targeted by this assay, and inadequate number of viral copies(<138 copies/mL). A negative result must be combined with clinical observations, patient history, and epidemiological information. The expected result is Negative.  Fact Sheet for Patients:   EntrepreneurPulse.com.au  Fact Sheet for Healthcare Providers:  IncredibleEmployment.be  This test is no t yet approved or cleared by the [place] FDA and  has been authorized for detection and/or diagnosis of SARS-CoV-2 by FDA under an Emergency Use Authorization (EUA). This EUA will remain  in effect (meaning this test can be used) for the duration of the COVID-19 declaration under Section 564(b)(1) of the Act, 21 U.S.C.section 360bbb-3(b)(1), unless the authorization is terminated  or revoked sooner.       Influenza A by PCR NEGATIVE NEGATIVE Final   Influenza B by PCR NEGATIVE NEGATIVE Final    Comment: (NOTE) The Xpert Xpress SARS-CoV-2/FLU/RSV plus assay is intended as an aid in the diagnosis of influenza from Nasopharyngeal swab specimens and should not be used as a sole basis for treatment. Nasal washings and aspirates are unacceptable for Xpert Xpress SARS-CoV-2/FLU/RSV testing.  Fact Sheet for Patients: EntrepreneurPulse.com.au  Fact Sheet for Healthcare Providers: IncredibleEmployment.be  This test is not yet approved or cleared by the [place] FDA and has been authorized for detection and/or diagnosis of SARS-CoV-2 by FDA under an Emergency Use Authorization (EUA). This EUA will remain in effect (meaning this test can be used) for the duration of the COVID-19 declaration under Section 564(b)(1) of the Act, 21 U.S.C. section 360bbb-3(b)(1), unless the authorization is terminated or revoked.  Performed at [facility], [place] [address] [place], [place] [address]   Blood Culture (routine x 2)     Status: None (Preliminary result)   Collection Time: 10/01/22  4:12 PM   Specimen: BLOOD  Result Value Ref Range Status   Specimen Description BLOOD SITE NOT SPECIFIED  Final   Special Requests   Final    BOTTLES DRAWN AEROBIC ONLY Blood Culture adequate volume   Culture   Final     NO GROWTH 3 DAYS Performed at [facility], [place] [address] [place], [place] [address]    Report Status PENDING  Incomplete  Blood Culture (routine x 2)     Status: None (Preliminary result)   Collection Time: 10/01/22  4:17 PM   Specimen: BLOOD  Result Value Ref Range Status   Specimen Description BLOOD SITE NOT SPECIFIED  Final   Special Requests   Final    BOTTLES DRAWN AEROBIC AND ANAEROBIC Blood Culture adequate volume   Culture   Final    NO GROWTH 3 DAYS Performed at [facility], [address] [place], [place] [address]  Report Status PENDING  Incomplete  Urine Culture     Status: None   Collection Time: 10/02/22  6:37 AM   Specimen: Urine, Clean Catch  Result Value Ref Range Status   Specimen Description URINE, CLEAN CATCH  Final   Special Requests NONE  Final   Culture   Final    NO GROWTH Performed at [facility], [address], [place], [place] [address]    Report Status 10/03/2022 FINAL  Final     Medications:    apixaban  5 mg Oral BID   atorvastatin  40 mg Oral QHS   DULoxetine  30 mg Oral QHS   famotidine  20 mg Oral Daily   fenofibrate  160 mg Oral QPM   gabapentin  200 mg Oral QHS   insulin aspart  0-9 Units Subcutaneous TID WC   isosorbide mononitrate  30 mg Oral BID   metoprolol succinate  100 mg Oral Daily   midodrine  5 mg Oral BID WC   mometasone-formoterol  2 puff Inhalation BID   Continuous Infusions:  cefTRIAXone (ROCEPHIN)  IV 2 g (10/03/22 1716)      LOS: 2 days   Charlynne Cousins  [facility]  10/04/2022, 8:13 AM

## 2022-10-04 NOTE — Progress Notes (Signed)
Mobility Specialist Progress Note:   10/04/22 1013  Mobility  Activity Ambulated with assistance in hallway  Level of Assistance Contact guard assist, steadying assist  Assistive Device None  Distance Ambulated (ft) 60 ft  Activity Response Tolerated well  Mobility Referral Yes  $Mobility charge 1 Mobility   Pt received in bed willing to participate in mobility. No complaints of pain. Left in bed with call bell in reach and all needs met.   Gareth Eagle Crystelle Ferrufino Mobility Specialist Please contact via [employer] or  Rehab Office at [phone]

## 2022-10-05 DIAGNOSIS — I9589 Other hypotension: Secondary | ICD-10-CM | POA: Diagnosis not present

## 2022-10-05 DIAGNOSIS — I959 Hypotension, unspecified: Secondary | ICD-10-CM | POA: Diagnosis not present

## 2022-10-05 DIAGNOSIS — R6510 Systemic inflammatory response syndrome (SIRS) of non-infectious origin without acute organ dysfunction: Secondary | ICD-10-CM | POA: Diagnosis not present

## 2022-10-05 DIAGNOSIS — N3000 Acute cystitis without hematuria: Secondary | ICD-10-CM | POA: Diagnosis not present

## 2022-10-05 LAB — GLUCOSE, CAPILLARY
Glucose-Capillary: 151 mg/dL — ABNORMAL HIGH (ref 70–99)
Glucose-Capillary: 182 mg/dL — ABNORMAL HIGH (ref 70–99)
Glucose-Capillary: 119 mg/dL — ABNORMAL HIGH (ref 70–99)
Glucose-Capillary: 164 mg/dL — ABNORMAL HIGH (ref 70–99)

## 2022-10-05 NOTE — Progress Notes (Signed)
Physical Therapy Treatment Patient Details Name: Brandi Hunter MRN: [id number] DOB: 06-08-1941 Today's Date: 10/05/2022   History of Present Illness Pt is a 81 y/o female admitted secondary to complicated UTI and hypotension. PMH includes DM, a fib, and COPD.    PT Comments    Pt received in supine, agreeable to therapy session and with good participation and tolerance for gait and stair training with multiple seated rest breaks on rollator for energy conservation. Pt making good progress toward her goals, she denies dizziness and is able to indicate when she is fatigued and needing to sit, pt mostly min guard to Supervision with rollator but needing up to minA for stair ascent/descent with safety/sequencing cues. Recommend pt consider installing a ramp if able for ease of entry/exiting home. Recommend she continue using rollator at home given recent c/o orthostatic symptoms and fatigue.    Recommendations for follow up therapy are one component of a multi-disciplinary discharge planning process, led by the attending physician.  Recommendations may be updated based on patient status, additional functional criteria and insurance authorization.  Follow Up Recommendations  Home health PT (vs OPPT if pt has transportation and is agreeable; HHPT would be ideal at first)     Assistance Recommended at Discharge Intermittent Supervision/Assistance  Patient can return home with the following A little help with walking and/or transfers;Assistance with cooking/housework;Assist for transportation;Help with stairs or ramp for entrance   Equipment Recommendations  None recommended by PT    Recommendations for Other Services       Precautions / Restrictions Precautions Precautions: Fall Precaution Comments: previous orthostatic hypotension Restrictions Weight Bearing Restrictions: No     Mobility  Bed Mobility Overal bed mobility: Modified Independent Bed Mobility: Supine to Sit, Sit to Supine      Supine to sit: Modified independent (Device/Increase time) Sit to supine: Modified independent (Device/Increase time)   General bed mobility comments: use of bed features    Transfers Overall transfer level: Needs assistance Equipment used: None, Rollator (4 wheels) Transfers: Sit to/from Stand Sit to Stand: Supervision           General transfer comment: good recall of using brakes with rollator after initial instruction    Ambulation/Gait Ambulation/Gait assistance: Min guard Gait Distance (Feet): 200 Feet ((237f total but with seated breaks: 1082f seated break, 5080fseated break, 31f67fAssistive device: Rollator (4 wheels) Gait Pattern/deviations: Step-through pattern, Decreased stride length       General Gait Details: min guard for safety, no overt LOB but cues needed for wider BOS as pt tends to nearly cross midline at times; cues for activity pacing and pt able to indicate when she needs seated break on rollator; PTA pushed rollator for her at times when pt fatigued until she was ready to walk again.   Stairs Stairs: Yes Stairs assistance: Min assist Stair Management: One rail Right, Step to pattern, Forwards (HHA on LUE) Number of Stairs: 5 General stair comments: pt ascended/descended single 7" step in stairwell (defer 5 in a row due to pt history of BP drop in standing), pt without dizziness or loss of balance and needing minA HHA as railings too wide apart to hold both at the same time. Cues for activity pacing w/pt needed seated break on rollator pre/post stairs and recommend she keep rollator and chair at top/bottom of steps for energy conservation and safety. Pt receptive.   Wheelchair Mobility    Modified Rankin (Stroke Patients Only)       Balance  Overall balance assessment: Needs assistance Sitting-balance support: No upper extremity supported, Feet supported Sitting balance-Leahy Scale: Normal     Standing balance support: No upper  extremity supported Standing balance-Leahy Scale: Poor (Poor to Fair) Standing balance comment: fair with rollator, poor without AD                            Cognition Arousal/Alertness: Awake/alert Behavior During Therapy: WFL for tasks assessed/performed Overall Cognitive Status: Within Functional Limits for tasks assessed                                      Pt states "This is the best I've felt in a month." (Today)    Exercises      General Comments General comments (skin integrity, edema, etc.): HR/SpO2 WFL on 2L O2 [place]      Pertinent Vitals/Pain Pain Assessment Pain Assessment: No/denies pain     PT Goals (current goals can now be found in the care plan section) Acute Rehab PT Goals Patient Stated Goal: to stop falling PT Goal Formulation: With patient Time For Goal Achievement: 10/17/22 Progress towards PT goals: Progressing toward goals    Frequency    Min 3X/week      PT Plan Current plan remains appropriate       AM-PAC PT "6 Clicks" Mobility   Outcome Measure  Help needed turning from your back to your side while in a flat bed without using bedrails?: None Help needed moving from lying on your back to sitting on the side of a flat bed without using bedrails?: A Little Help needed moving to and from a bed to a chair (including a wheelchair)?: A Little Help needed standing up from a chair using your arms (e.g., wheelchair or bedside chair)?: A Little Help needed to walk in hospital room?: A Little Help needed climbing 3-5 steps with a railing? : A Little 6 Click Score: 19    End of Session Equipment Utilized During Treatment: Gait belt;Oxygen Activity Tolerance: Patient tolerated treatment well Patient left: in bed;with call bell/phone within reach;with bed alarm set Nurse Communication: Mobility status;Other (comment);Precautions (bed alarm on for her safety due to her hx of orthostatic hypotension) PT Visit Diagnosis:  Unsteadiness on feet (R26.81);Muscle weakness (generalized) (M62.81)     Time: 3888-7579 PT Time Calculation (min) (ACUTE ONLY): 27 min  Charges:  $Gait Training: 8-22 mins $Therapeutic Activity: 8-22 mins                     Santa Abdelrahman P., PTA Acute Rehabilitation Services Secure Chat Preferred 9a-5:30pm Office: [place] 10/05/2022, 6:58 PM

## 2022-10-05 NOTE — Care Management Important Message (Signed)
Important Message  Patient Details  Name: Brandi Hunter MRN: [id number] Date of Birth: February 08, 1941   Medicare Important Message Given:  Yes     [name] 10/05/2022, 3:17 PM

## 2022-10-05 NOTE — Progress Notes (Signed)
Mobility Specialist Progress Note   10/05/22 1100  Mobility  Activity Ambulated with assistance in hallway;Dangled on edge of bed  Level of Assistance Contact guard assist, steadying assist  Assistive Device Front wheel walker  Distance Ambulated (ft) 80 ft  Range of Motion/Exercises Active;All extremities  Activity Response Tolerated well   Patient received in supine, agreeable to participate with some encouragement. Ambulated min guard with a narrow base and slow gait. Patient mentioned that she sometimes has scissors gait but none observed this session. Required seated rest break x1 secondary to SOB. Returned to room without complaint or incident. Patient mentioned her fatigue level improved and overall the best she's felt while ambulating. Was left dangling EOB with all needs met, call bell in reach.   Brandi Hunter, BS EXP Mobility Specialist Please contact via [employer] or Rehab office at [phone]

## 2022-10-05 NOTE — Progress Notes (Signed)
[facility] PROGRESS NOTE    Progress Note  BROOKIE WAYMENT  FMB:[id number] DOB: [birth year] DOA: 10/01/2022 PCP: [name], FNP     Brief Narrative:   Brandi Hunter is an 81 y.o. female past medical history of chronic diastolic heart failure paroxysmal atrial fibrillation on Eliquis, Mobitz type II status post pacemaker placement several weeks ago comes in with hypotension along with a UTI treated with IV fluids and oral antibiotics, subsequently follow-up with PCP when he was found hypotensive again, in the ED was fluid resuscitated started empirically on antibiotics    Assessment/Plan:   Complicated UTI: Failed outpatient treatment. He was previously on antibiotics urine culture are negative. He was started on IV antibiotics on admission, since he responded to IV Rocephin we will continue this for total 5 days.  Hypotension: Multifactorial with a history of orthostatic hypotension nausea and vomiting for several days. ACTH stimulation test done 10/03/2022 not consistent with adrenal insufficiency. She has a history of orthostatic hypotension and she is on Imdur discontinue Imdur.  History of orthostatic hypotension: Continue midodrine. She rates her orthostatic symptoms have resolved.  Essential hypertension: Difficult situation she is on midodrine for orthostatic hypotension and was on  Imdur. We will start weaning off Imdur. Continue metoprolol. Relates her orthostatic symptoms have resolved  Chronic diastolic heart failure: Appears to be euvolemic.  Paroxysmal atrial fibrillation: Continue Eliquis and metoprolol no events on telemetry.  Mobitz type II heart block/pacemaker placement: Noted.  Hyperlipidemia:  Continue statins.  COPD/chronic hypoxic respiratory failure: Continue current oxygen level and continue inhalers.  Diabetes mellitus type 2: With an A1c of 6.0. Blood glucose relatively well controlled with minimal insulin continue current  regimen.   DVT prophylaxis: Eliquis Family Communication:none Status is: Inpatient Remains inpatient appropriate because: Complicated UTI    Code Status:     Code Status Orders  (From admission, onward)           Start     Ordered   10/01/22 2211  Full code  Continuous        10/01/22 2212           Code Status History     Date Active Date Inactive Code Status Order ID Comments User Context   05/05/2020 2220 05/07/2020 1849 Full Code [id number]  Reubin Milan, MD ED   05/05/2020 1937 05/05/2020 2220 Full Code [id number]  Reubin Milan, MD ED   08/29/2019 2254 08/31/2019 1757 Full Code [id number]  Bethena Roys, MD ED   07/18/2018 1934 07/20/2018 1657 Full Code [id number]  Reubin Milan, MD ED   07/18/2018 1934 07/18/2018 1934 Full Code [id number]  Reubin Milan, MD ED   06/30/2018 1743 07/02/2018 1757 Full Code [id number]  Charlynne Cousins, MD Inpatient   03/23/2018 1426 03/25/2018 1712 DNR [id number]  [name], NP ED   03/23/2018 1023 03/23/2018 1425 Full Code [id number]  [name], NP ED   09/26/2017 1517 09/28/2017 1840 DNR [id number]  Murlean Iba, MD Inpatient   01/08/2015 1405 01/10/2015 1524 DNR [id number]  Caren Griffins, MD Inpatient   01/08/2015 0117 01/08/2015 1405 Full Code [id number]  Eber Jones, MD Inpatient         IV Access:   Peripheral IV   Procedures and diagnostic studies:   No results found.   Medical Consultants:   None.   Subjective:    [name] feels better no complaints.  Objective:    Vitals:  10/04/22 0827 10/04/22 1625 10/04/22 2014 10/04/22 2038  BP: (!) 118/51 130/88 (!) 126/49   Pulse: 72 71 77   Resp: '18 18 18   '$ Temp: 97.7 F (36.5 C) 98.4 F (36.9 C) (!) 97.5 F (36.4 C)   TempSrc: Oral Oral Oral   SpO2: 100% 98% 98% 98%  Weight:      Height:       SpO2: 98 % O2 Flow Rate (L/min): 2 L/min   Intake/Output Summary (Last 24 hours) at 10/05/2022 0829 Last  data filed at 10/05/2022 0047 Gross per 24 hour  Intake 137.78 ml  Output --  Net 137.78 ml    Filed Weights   10/01/22 1612  Weight: 85.3 kg    Exam: General exam: In no acute distress. Respiratory system: Good air movement and clear to auscultation. Cardiovascular system: S1 & S2 heard, RRR. No JVD. Gastrointestinal system: Abdomen is nondistended, soft and nontender.  Extremities: No pedal edema. Skin: No rashes, lesions or ulcers Psychiatry: Judgement and insight appear normal. Mood & affect appropriate.   Data Reviewed:    Labs: Basic Metabolic Panel: Recent Labs  Lab 10/01/22 1612 10/02/22 0511 10/03/22 0622 10/04/22 0208  NA 139 140 137 136  K 4.8 4.7 4.5 4.7  CL 103 104 104 101  CO2 '24 26 26 27  '$ GLUCOSE 120* 152* 138* 179*  BUN 39* 36* 31* 28*  CREATININE 1.87* 1.93* 1.72* 1.52*  CALCIUM 8.9 8.4* 8.6* 8.7*    GFR Estimated Creatinine Clearance: 33 mL/min (A) (by C-G formula based on SCr of 1.52 mg/dL (H)). Liver Function Tests: Recent Labs  Lab 10/01/22 1612  AST 28  ALT 15  ALKPHOS 50  BILITOT 0.7  PROT 5.7*  ALBUMIN 2.7*    No results for input(s): "LIPASE", "AMYLASE" in the last 168 hours. No results for input(s): "AMMONIA" in the last 168 hours. Coagulation profile Recent Labs  Lab 10/01/22 1612  INR 2.0*    COVID-19 Labs  No results for input(s): "DDIMER", "FERRITIN", "LDH", "CRP" in the last 72 hours.  Lab Results  Component Value Date   SARSCOV2NAA NEGATIVE 10/01/2022   [place] NEGATIVE 05/05/2020   [place] Not Detected 12/22/2019   [place] NEGATIVE 08/29/2019    CBC: Recent Labs  Lab 10/01/22 1612 10/02/22 0511 10/03/22 0622  WBC 7.0 12.4* 8.8  NEUTROABS 4.9  --   --   HGB 10.7* 9.7* 9.4*  HCT 34.0* 30.0* 28.5*  MCV 95.8 93.2 92.5  PLT 390 351 326    Cardiac Enzymes: No results for input(s): "CKTOTAL", "CKMB", "CKMBINDEX", "TROPONINI" in the last 168 hours. BNP (last 3 results) No results for  input(s): "PROBNP" in the last 8760 hours. CBG: Recent Labs  Lab 10/04/22 0723 10/04/22 0822 10/04/22 1212 10/04/22 1605 10/04/22 2021  GLUCAP 147* 213* 167* 143* 171*    D-Dimer: No results for input(s): "DDIMER" in the last 72 hours. Hgb A1c: Recent Labs    10/02/22 1952  HGBA1C 6.0*    Lipid Profile: No results for input(s): "CHOL", "HDL", "LDLCALC", "TRIG", "CHOLHDL", "LDLDIRECT" in the last 72 hours. Thyroid function studies: No results for input(s): "TSH", "T4TOTAL", "T3FREE", "THYROIDAB" in the last 72 hours.  Invalid input(s): "FREET3" Anemia work up: No results for input(s): "VITAMINB12", "FOLATE", "FERRITIN", "TIBC", "IRON", "RETICCTPCT" in the last 72 hours. Sepsis Labs: Recent Labs  Lab 10/01/22 1611 10/01/22 1612 10/01/22 1811 10/02/22 0511 10/03/22 0622  WBC  --  7.0  --  12.4* 8.8  LATICACIDVEN 2.8*  --  1.5  --   --     Microbiology Recent Results (from the past 240 hour(s))  Resp Panel by RT-PCR (Flu A&B, Covid) Anterior Nasal Swab     Status: None   Collection Time: 10/01/22  4:12 PM   Specimen: Anterior Nasal Swab  Result Value Ref Range Status   SARS Coronavirus 2 by RT PCR NEGATIVE NEGATIVE Final    Comment: (NOTE) SARS-CoV-2 target nucleic acids are NOT DETECTED.  The SARS-CoV-2 RNA is generally detectable in upper respiratory specimens during the acute phase of infection. The lowest concentration of SARS-CoV-2 viral copies this assay can detect is 138 copies/mL. A negative result does not preclude SARS-Cov-2 infection and should not be used as the sole basis for treatment or other patient management decisions. A negative result may occur with  improper specimen collection/handling, submission of specimen other than nasopharyngeal swab, presence of viral mutation(s) within the areas targeted by this assay, and inadequate number of viral copies(<138 copies/mL). A negative result must be combined with clinical observations, patient  history, and epidemiological information. The expected result is Negative.  Fact Sheet for Patients:  EntrepreneurPulse.com.au  Fact Sheet for Healthcare Providers:  IncredibleEmployment.be  This test is no t yet approved or cleared by the [place] FDA and  has been authorized for detection and/or diagnosis of SARS-CoV-2 by FDA under an Emergency Use Authorization (EUA). This EUA will remain  in effect (meaning this test can be used) for the duration of the COVID-19 declaration under Section 564(b)(1) of the Act, 21 U.S.C.section 360bbb-3(b)(1), unless the authorization is terminated  or revoked sooner.       Influenza A by PCR NEGATIVE NEGATIVE Final   Influenza B by PCR NEGATIVE NEGATIVE Final    Comment: (NOTE) The Xpert Xpress SARS-CoV-2/FLU/RSV plus assay is intended as an aid in the diagnosis of influenza from Nasopharyngeal swab specimens and should not be used as a sole basis for treatment. Nasal washings and aspirates are unacceptable for Xpert Xpress SARS-CoV-2/FLU/RSV testing.  Fact Sheet for Patients: EntrepreneurPulse.com.au  Fact Sheet for Healthcare Providers: IncredibleEmployment.be  This test is not yet approved or cleared by the [place] FDA and has been authorized for detection and/or diagnosis of SARS-CoV-2 by FDA under an Emergency Use Authorization (EUA). This EUA will remain in effect (meaning this test can be used) for the duration of the COVID-19 declaration under Section 564(b)(1) of the Act, 21 U.S.C. section 360bbb-3(b)(1), unless the authorization is terminated or revoked.  Performed at [facility], [place] [address] [place], [place] [address]   Blood Culture (routine x 2)     Status: None (Preliminary result)   Collection Time: 10/01/22  4:12 PM   Specimen: BLOOD  Result Value Ref Range Status   Specimen Description BLOOD SITE NOT SPECIFIED  Final    Special Requests   Final    BOTTLES DRAWN AEROBIC ONLY Blood Culture adequate volume   Culture   Final    NO GROWTH 4 DAYS Performed at [facility], [place] [address] [place], [place] [address]    Report Status PENDING  Incomplete  Blood Culture (routine x 2)     Status: None (Preliminary result)   Collection Time: 10/01/22  4:17 PM   Specimen: BLOOD  Result Value Ref Range Status   Specimen Description BLOOD SITE NOT SPECIFIED  Final   Special Requests   Final    BOTTLES DRAWN AEROBIC AND ANAEROBIC Blood Culture adequate volume   Culture  Final    NO GROWTH 4 DAYS Performed at [facility], [place] [address], [place], [place] [address]    Report Status PENDING  Incomplete  Urine Culture     Status: None   Collection Time: 10/02/22  6:37 AM   Specimen: Urine, Clean Catch  Result Value Ref Range Status   Specimen Description URINE, CLEAN CATCH  Final   Special Requests NONE  Final   Culture   Final    NO GROWTH Performed at [name], [place] [address] [place], [place] [address]    Report Status 10/03/2022 FINAL  Final     Medications:    apixaban  5 mg Oral BID   atorvastatin  40 mg Oral QHS   DULoxetine  30 mg Oral QHS   famotidine  20 mg Oral Daily   fenofibrate  160 mg Oral QPM   gabapentin  200 mg Oral QHS   insulin aspart  0-9 Units Subcutaneous TID WC   metoprolol succinate  100 mg Oral Daily   midodrine  5 mg Oral BID WC   mometasone-formoterol  2 puff Inhalation BID   Continuous Infusions:  cefTRIAXone (ROCEPHIN)  IV Stopped (10/04/22 1648)      LOS: 3 days   Charlynne Cousins  [facility]  10/05/2022, 8:29 AM
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
## 2022-10-06 LAB — GLUCOSE, CAPILLARY
Glucose-Capillary: 167 mg/dL — ABNORMAL HIGH (ref 70–99)
Glucose-Capillary: 79 mg/dL (ref 70–99)

## 2022-10-06 LAB — CULTURE, BLOOD (ROUTINE X 2)
Culture: NO GROWTH
Culture: NO GROWTH
Special Requests: ADEQUATE
Special Requests: ADEQUATE

## 2022-10-06 NOTE — Discharge Summary (Signed)
Physician Discharge Summary  Brandi Hunter HER:[id number] DOB: 12-18-1940 DOA: 10/01/2022  PCP: [name], FNP  Admit date: 10/01/2022 Discharge date: 10/06/2022  Admitted From: Home Disposition:  Home  Recommendations for Outpatient Follow-up:  Follow up with PCP in 1-2 weeks Please obtain BMP/CBC in one week   Home Health:Yes Equipment/Devices:None Discharge Condition:Stable CODE STATUS:Full Diet recommendation: Heart Healthy  Brief/Interim Summary: 81 y.o. 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 Was previously on antibiotics and urine cultures were negative. She was started on IV Protonix complete 5-day course in-house.  Hypotension: Multifactorial in the setting of orthostatic hypotension, nausea and vomiting for several days ACTH stimulation test was unremarkable. She has a history of orthostasis and was Imdur, which has now been discontinued.  History of orthostatic hypotension: Imdur was discontinued continue midodrine his blood pressure has remained stable. Her lightheadedness upon standing has resolved.  Essential hypertension: Difficult situation  she is on midodrine for orthostatic hypotension and was on Imdur this now has been weaned off. She has been continue on metoprolol. And her orthostasis has resolved. Her systolic blood pressure has been ranging 1 30-1 40.  Will have to accept that as this keeps her symptoms and free.  Chronic diastolic heart failure: Appears euvolemic she was continue metoprolol.  Paroxysmal atrial fibrillation: Continue metoprolol and Eliquis.  Mobitz type II heart block/pacemaker placement: Noted.  Hyperlipidemia: No changes made continue statins.  COPD: Chronic respiratory failure with hypoxia on 2 L of oxygen remained stable.  Diabetes mellitus type 2: No changes made to her medication.   Discharge Instructions  Discharge Instructions     Diet - low sodium heart healthy   Complete by: As directed    Increase activity slowly   Complete by: As directed       Allergies as of 10/06/2022       Reactions   Bee Venom Swelling   Latex Swelling   LATEX CATHETERS        Medication List     STOP taking these medications    isosorbide mononitrate 30 MG 24 hr tablet Commonly known as: IMDUR       TAKE these medications    acetaminophen 650 MG CR tablet Commonly known as: TYLENOL Take 650 mg by mouth every 8 (eight) hours as needed for pain.   albuterol 108 (90 Base) MCG/ACT inhaler Commonly known as: VENTOLIN HFA Inhale 2 puffs into the lungs every 4 (four) hours as needed for wheezing.   ALIGN PO Take 1 capsule by mouth daily.   atorvastatin 40 MG tablet Commonly known as: LIPITOR Take 40 mg by mouth at bedtime.   budesonide-formoterol 160-4.5 MCG/ACT inhaler Commonly known as: SYMBICORT Inhale 2 puffs into the lungs 2 (  two) times daily.   diclofenac Sodium 1 % Gel Commonly known as: VOLTAREN Apply 2 g topically 2 (two) times daily as needed (for pain).   diltiazem 120 MG 24 hr capsule Commonly known as: CARDIZEM CD Take 1 capsule by mouth once daily    DULoxetine 30 MG capsule Commonly known as: CYMBALTA Take 30 mg by mouth at bedtime.   Eliquis 5 MG Tabs tablet Generic drug: apixaban Take 1 tablet by mouth twice daily   famotidine 20 MG tablet Commonly known as: PEPCID Take 20 mg by mouth daily.   fenofibrate 160 MG tablet Take 160 mg by mouth every evening.   furosemide 20 MG tablet Commonly known as: LASIX Take 1 tablet by mouth once daily   gabapentin 100 MG capsule Commonly known as: NEURONTIN Take 200 mg by mouth at bedtime.   ipratropium-albuterol 0.5-2.5 (3) MG/3ML Soln Commonly known as: DUONEB Take 3 mLs by nebulization 4 (four) times daily.   LORazepam 0.5 MG tablet Commonly known as: ATIVAN Take by mouth See admin instructions. Take half to one tablet (0.25 mg - 0.5 mg) by mouth twice daily as needed for anxiety   metFORMIN 1000 MG tablet Commonly known as: GLUCOPHAGE Take 500 mg by mouth 2 (two) times daily.   metoprolol succinate 100 MG 24 hr tablet Commonly known as: TOPROL-XL Take 1 tablet by mouth once daily   midodrine 5 MG tablet Commonly known as: PROAMATINE Take 1 tablet (5 mg total) by mouth in the morning and at bedtime.   nitroGLYCERIN 0.4 MG SL tablet Commonly known as: NITROSTAT DISSOLVE ONE TABLET UNDER THE TONGUE EVERY 5 MINUTES AS NEEDED FOR CHEST PAIN.  DO NOT EXCEED A TOTAL OF 3 DOSES IN 15 MINUTES What changed: See the new instructions.   OXYGEN Inhale 2 L into the lungs continuous.   spironolactone 25 MG tablet Commonly known as: ALDACTONE Take 1/2 (one-half) tablet by mouth once daily        Allergies  Allergen Reactions   Bee Venom Swelling   Latex Swelling    LATEX CATHETERS    Consultations: None   Procedures/Studies: DG Abd Portable 1V  Result Date: 10/01/2022 CLINICAL DATA:  Intractable nausea and vomiting EXAM: PORTABLE ABDOMEN - 1 VIEW COMPARISON:  None Available. FINDINGS: Normal abdominal gas pattern. No free intraperitoneal gas. No organomegaly.  No nephro or urolithiasis. Small phleboliths noted within the left hemipelvis. Osseous structures are age-appropriate. IMPRESSION: 1. Normal abdominal gas pattern. Electronically Signed   By: Fidela Salisbury M.D.   On: 10/01/2022 22:46   DG Chest Port 1 View  Result Date: 10/01/2022 CLINICAL DATA:  Questionable sepsis.  Evaluate for abnormality. EXAM: PORTABLE CHEST 1 VIEW COMPARISON:  AP chest 05/05/2020 FINDINGS: Cardiac silhouette is again moderately enlarged. Right chest wall cardiac pacer with leads overlying the right atrium and right ventricle, similar to prior. Mediastinal contours are within normal limits. Mild calcification within the aortic arch. Mildly decreased lung volumes. No focal airspace opacity. No pulmonary edema, pleural effusion, or pneumothorax. No acute skeletal abnormality. IMPRESSION: 1. Mildly decreased lung volumes. No focal airspace opacity. 2. Cardiomegaly. Electronically Signed   By: Yvonne Kendall M.D.   On: 10/01/2022 16:32   CUP PACEART REMOTE DEVICE CHECK  Result Date: 09/11/2022 Scheduled remote reviewed. Normal device function.  Permanent AF, rates controlled, +OAC Next remote 91 days. LA  (Echo, Carotid, EGD, Colonoscopy, ERCP)    Subjective: No complaints  Discharge Exam: Vitals:   10/05/22 1622 10/05/22 2035  BP: Marland Kitchen)  137/55   Pulse: 74 76  Resp: 18 18  Temp: 98.3 F (36.8 C)   SpO2: 100% 99%   Vitals:   10/05/22 0831 10/05/22 0925 10/05/22 1622 10/05/22 2035  BP: (!) 135/96  (!) 137/55   Pulse: 86  74 76  Resp: '18  18 18  '$ Temp: 98.1 F (36.7 C)  98.3 F (36.8 C)   TempSrc: Oral  Oral   SpO2: 98% 95% 100% 99%  Weight:      Height:        General: Pt is alert, awake, not in acute distress Cardiovascular: RRR, S1/S2 +, no rubs, no gallops Respiratory: CTA bilaterally, no wheezing, no rhonchi Abdominal: Soft, NT, ND, bowel sounds + Extremities: no edema, no cyanosis    The results of significant diagnostics from this hospitalization  (including imaging, microbiology, ancillary and laboratory) are listed below for reference.     Microbiology: Recent Results (from the past 240 hour(s))  Resp Panel by RT-PCR (Flu A&B, Covid) Anterior Nasal Swab     Status: None   Collection Time: 10/01/22  4:12 PM   Specimen: Anterior Nasal Swab  Result Value Ref Range Status   SARS Coronavirus 2 by RT PCR NEGATIVE NEGATIVE Final    Comment: (NOTE) SARS-CoV-2 target nucleic acids are NOT DETECTED.  The SARS-CoV-2 RNA is generally detectable in upper respiratory specimens during the acute phase of infection. The lowest concentration of SARS-CoV-2 viral copies this assay can detect is 138 copies/mL. A negative result does not preclude SARS-Cov-2 infection and should not be used as the sole basis for treatment or other patient management decisions. A negative result may occur with  improper specimen collection/handling, submission of specimen other than nasopharyngeal swab, presence of viral mutation(s) within the areas targeted by this assay, and inadequate number of viral copies(<138 copies/mL). A negative result must be combined with clinical observations, patient history, and epidemiological information. The expected result is Negative.  Fact Sheet for Patients:  EntrepreneurPulse.com.au  Fact Sheet for Healthcare Providers:  IncredibleEmployment.be  This test is no t yet approved or cleared by the [place] FDA and  has been authorized for detection and/or diagnosis of SARS-CoV-2 by FDA under an Emergency Use Authorization (EUA). This EUA will remain  in effect (meaning this test can be used) for the duration of the COVID-19 declaration under Section 564(b)(1) of the Act, 21 U.S.C.section 360bbb-3(b)(1), unless the authorization is terminated  or revoked sooner.       Influenza A by PCR NEGATIVE NEGATIVE Final   Influenza B by PCR NEGATIVE NEGATIVE Final    Comment: (NOTE) The  Xpert Xpress SARS-CoV-2/FLU/RSV plus assay is intended as an aid in the diagnosis of influenza from Nasopharyngeal swab specimens and should not be used as a sole basis for treatment. Nasal washings and aspirates are unacceptable for Xpert Xpress SARS-CoV-2/FLU/RSV testing.  Fact Sheet for Patients: EntrepreneurPulse.com.au  Fact Sheet for Healthcare Providers: IncredibleEmployment.be  This test is not yet approved or cleared by the [place] FDA and has been authorized for detection and/or diagnosis of SARS-CoV-2 by FDA under an Emergency Use Authorization (EUA). This EUA will remain in effect (meaning this test can be used) for the duration of the COVID-19 declaration under Section 564(b)(1) of the Act, 21 U.S.C. section 360bbb-3(b)(1), unless the authorization is terminated or revoked.  Performed at [facility], [place] [address], [place], [place] [address]   Blood Culture (routine x 2)     Status: None  Collection Time: 10/01/22  4:12 PM   Specimen: BLOOD  Result Value Ref Range Status   Specimen Description BLOOD SITE NOT SPECIFIED  Final   Special Requests   Final    BOTTLES DRAWN AEROBIC ONLY Blood Culture adequate volume   Culture   Final    NO GROWTH 5 DAYS Performed at [facility], [address], [place], [place] [address]    Report Status 10/06/2022 FINAL  Final  Blood Culture (routine x 2)     Status: None   Collection Time: 10/01/22  4:17 PM   Specimen: BLOOD  Result Value Ref Range Status   Specimen Description BLOOD SITE NOT SPECIFIED  Final   Special Requests   Final    BOTTLES DRAWN AEROBIC AND ANAEROBIC Blood Culture adequate volume   Culture   Final    NO GROWTH 5 DAYS Performed at [facility], [place] [address], [place], [place] [address]    Report Status 10/06/2022 FINAL  Final  Urine Culture     Status: None   Collection Time: 10/02/22  6:37 AM   Specimen: Urine, Clean Catch  Result Value  Ref Range Status   Specimen Description URINE, CLEAN CATCH  Final   Special Requests NONE  Final   Culture   Final    NO GROWTH Performed at [facility], [place] [address] [place], [place] [address]    Report Status 10/03/2022 FINAL  Final     Labs: BNP (last 3 results) No results for input(s): "BNP" in the last 8760 hours. Basic Metabolic Panel: Recent Labs  Lab 10/01/22 1612 10/02/22 0511 10/03/22 0622 10/04/22 0208  NA 139 140 137 136  K 4.8 4.7 4.5 4.7  CL 103 104 104 101  CO2 '24 26 26 27  '$ GLUCOSE 120* 152* 138* 179*  BUN 39* 36* 31* 28*  CREATININE 1.87* 1.93* 1.72* 1.52*  CALCIUM 8.9 8.4* 8.6* 8.7*   Liver Function Tests: Recent Labs  Lab 10/01/22 1612  AST 28  ALT 15  ALKPHOS 50  BILITOT 0.7  PROT 5.7*  ALBUMIN 2.7*   No results for input(s): "LIPASE", "AMYLASE" in the last 168 hours. No results for input(s): "AMMONIA" in the last 168 hours. CBC: Recent Labs  Lab 10/01/22 1612 10/02/22 0511 10/03/22 0622  WBC 7.0 12.4* 8.8  NEUTROABS 4.9  --   --   HGB 10.7* 9.7* 9.4*  HCT 34.0* 30.0* 28.5*  MCV 95.8 93.2 92.5  PLT 390 351 326   Cardiac Enzymes: No results for input(s): "CKTOTAL", "CKMB", "CKMBINDEX", "TROPONINI" in the last 168 hours. BNP: Invalid input(s): "POCBNP" CBG: Recent Labs  Lab 10/04/22 2021 10/05/22 0828 10/05/22 1216 10/05/22 1622 10/05/22 2054  GLUCAP 171* 151* 182* 119* 164*   D-Dimer No results for input(s): "DDIMER" in the last 72 hours. Hgb A1c No results for input(s): "HGBA1C" in the last 72 hours. Lipid Profile No results for input(s): "CHOL", "HDL", "LDLCALC", "TRIG", "CHOLHDL", "LDLDIRECT" in the last 72 hours. Thyroid function studies No results for input(s): "TSH", "T4TOTAL", "T3FREE", "THYROIDAB" in the last 72 hours.  Invalid input(s): "FREET3" Anemia work up No results for input(s): "VITAMINB12", "FOLATE", "FERRITIN", "TIBC", "IRON", "RETICCTPCT" in the last 72 hours. Urinalysis    Component  Value Date/Time   COLORURINE AMBER (A) 10/02/2022 0640   APPEARANCEUR HAZY (A) 10/02/2022 0640   LABSPEC 1.017 10/02/2022 0640   PHURINE 5.0 10/02/2022 [place] 10/02/2022 0640   HGBUR NEGATIVE 10/02/2022 0640  BILIRUBINUR NEGATIVE 10/02/2022 [place] 10/02/2022 0640   PROTEINUR 30 (A) 10/02/2022 0640   UROBILINOGEN 1.0 05/11/2011 2020   NITRITE POSITIVE (A) 10/02/2022 0640   LEUKOCYTESUR TRACE (A) 10/02/2022 0640   Sepsis Labs Recent Labs  Lab 10/01/22 1612 10/02/22 0511 10/03/22 0622  WBC 7.0 12.4* 8.8   Microbiology Recent Results (from the past 240 hour(s))  Resp Panel by RT-PCR (Flu A&B, Covid) Anterior Nasal Swab     Status: None   Collection Time: 10/01/22  4:12 PM   Specimen: Anterior Nasal Swab  Result Value Ref Range Status   SARS Coronavirus 2 by RT PCR NEGATIVE NEGATIVE Final    Comment: (NOTE) SARS-CoV-2 target nucleic acids are NOT DETECTED.  The SARS-CoV-2 RNA is generally detectable in upper respiratory specimens during the acute phase of infection. The lowest concentration of SARS-CoV-2 viral copies this assay can detect is 138 copies/mL. A negative result does not preclude SARS-Cov-2 infection and should not be used as the sole basis for treatment or other patient management decisions. A negative result may occur with  improper specimen collection/handling, submission of specimen other than nasopharyngeal swab, presence of viral mutation(s) within the areas targeted by this assay, and inadequate number of viral copies(<138 copies/mL). A negative result must be combined with clinical observations, patient history, and epidemiological information. The expected result is Negative.  Fact Sheet for Patients:  EntrepreneurPulse.com.au  Fact Sheet for Healthcare Providers:  IncredibleEmployment.be  This test is no t yet approved or cleared by the [place] FDA and  has been  authorized for detection and/or diagnosis of SARS-CoV-2 by FDA under an Emergency Use Authorization (EUA). This EUA will remain  in effect (meaning this test can be used) for the duration of the COVID-19 declaration under Section 564(b)(1) of the Act, 21 U.S.C.section 360bbb-3(b)(1), unless the authorization is terminated  or revoked sooner.       Influenza A by PCR NEGATIVE NEGATIVE Final   Influenza B by PCR NEGATIVE NEGATIVE Final    Comment: (NOTE) The Xpert Xpress SARS-CoV-2/FLU/RSV plus assay is intended as an aid in the diagnosis of influenza from Nasopharyngeal swab specimens and should not be used as a sole basis for treatment. Nasal washings and aspirates are unacceptable for Xpert Xpress SARS-CoV-2/FLU/RSV testing.  Fact Sheet for Patients: EntrepreneurPulse.com.au  Fact Sheet for Healthcare Providers: IncredibleEmployment.be  This test is not yet approved or cleared by the [place] FDA and has been authorized for detection and/or diagnosis of SARS-CoV-2 by FDA under an Emergency Use Authorization (EUA). This EUA will remain in effect (meaning this test can be used) for the duration of the COVID-19 declaration under Section 564(b)(1) of the Act, 21 U.S.C. section 360bbb-3(b)(1), unless the authorization is terminated or revoked.  Performed at [facility], [place] [address] [place], [place] [address]   Blood Culture (routine x 2)     Status: None   Collection Time: 10/01/22  4:12 PM   Specimen: BLOOD  Result Value Ref Range Status   Specimen Description BLOOD SITE NOT SPECIFIED  Final   Special Requests   Final    BOTTLES DRAWN AEROBIC ONLY Blood Culture adequate volume   Culture   Final    NO GROWTH 5 DAYS Performed at [facility], [place] [address] [place], [place] [address]    Report Status 10/06/2022 FINAL  Final  Blood Culture (routine x 2)     Status: None   Collection Time: 10/01/22  4:17  PM    Specimen: BLOOD  Result Value Ref Range Status   Specimen Description BLOOD SITE NOT SPECIFIED  Final   Special Requests   Final    BOTTLES DRAWN AEROBIC AND ANAEROBIC Blood Culture adequate volume   Culture   Final    NO GROWTH 5 DAYS Performed at [facility], [address] [place], [place] [address]    Report Status 10/06/2022 FINAL  Final  Urine Culture     Status: None   Collection Time: 10/02/22  6:37 AM   Specimen: Urine, Clean Catch  Result Value Ref Range Status   Specimen Description URINE, CLEAN CATCH  Final   Special Requests NONE  Final   Culture   Final    NO GROWTH Performed at [facility], [place] [address] [place], [place] [address]    Report Status 10/03/2022 FINAL  Final     SIGNED:   Charlynne Cousins, MD  [facility] 10/06/2022, 8:02 AM Pager   If 7PM-7AM, please contact night-coverage [email or web address] Password [email or web address]
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
## 2022-10-06 NOTE — Progress Notes (Signed)
Physical Therapy Treatment Patient Details Name: Brandi Hunter MRN: [id number] DOB: December 18, 1940 Today's Date: 10/06/2022   History of Present Illness Pt is a 81 y/o female admitted secondary to complicated UTI and hypotension. PMH includes DM, a fib, and COPD.    PT Comments    Pt received in supine, agreeable to therapy session with good participation and tolerance for household distance gait trials x2 with stair ascent to simulate home entry. Pt needing up to min guard for safety with transfers and stair training and denies dizziness during functional mobility tasks. Of note, pt BP still orthostatic and MD/RN notified in case pt would benefit from TED hose. BP 139/75 (95) seated EOB and BP 112/73 (84) standing, pt denies lightheadedness, HR 81 bpm standing. SpO2 WFL on 2L O2 [place] and DOE 2/4 with exertional tasks (stairs). Pt continues to benefit from PT services to progress toward functional mobility goals.   Recommendations for follow up therapy are one component of a multi-disciplinary discharge planning process, led by the attending physician.  Recommendations may be updated based on patient status, additional functional criteria and insurance authorization.  Follow Up Recommendations  Home health PT (vs OPPT if pt has transportation and is agreeable; HHPT would be ideal at first)     Assistance Recommended at Discharge Intermittent Supervision/Assistance  Patient can return home with the following A little help with walking and/or transfers;Assistance with cooking/housework;Assist for transportation;Help with stairs or ramp for entrance   Equipment Recommendations  None recommended by PT    Recommendations for Other Services       Precautions / Restrictions Precautions Precautions: Fall Precaution Comments: previous orthostatic hypotension Restrictions Weight Bearing Restrictions: No     Mobility  Bed Mobility Overal bed mobility: Modified Independent Bed Mobility: Supine to  Sit, Sit to Supine     Supine to sit: Modified independent (Device/Increase time) Sit to supine: Modified independent (Device/Increase time)   General bed mobility comments: use of bed features    Transfers Overall transfer level: Needs assistance Equipment used: None, Rollator (4 wheels) Transfers: Sit to/from Stand Sit to Stand: Supervision, Min guard           General transfer comment: pt forgetting to use brakes on one occasion and sitting on computer chair, encouraged her to sit on rollator with brakes on as other chairs in hallway are less safe/reliable for her when they are on wheels and increase her fall risk, min guard when not sitting on EOB/rollator    Ambulation/Gait Ambulation/Gait assistance: Supervision Gait Distance (Feet): 225 Feet (with seated breaks x2 included) Assistive device: Rollator (4 wheels) Gait Pattern/deviations: Step-through pattern, Decreased stride length       General Gait Details: supervision, pt only needing x2 seated breaks during gait trials and x1 seated break after stair ascent (x3 total breaks)   Stairs Stairs: Yes Stairs assistance: Min guard Stair Management: One rail Right, Step to pattern, Forwards, One rail Left (pt using rollator handle on her L side (brakes locked) to push up to top of 7" step and R railing) Number of Stairs: 5 General stair comments: pt ascended/descended single 7" step in stairwell, pt without dizziness or loss of balance and min guard for safety only; pt needed seated break ~2-3 minutes in rollator seat afterward to recuperate prior to return to room; DOE 2/4, SpO2 WFL on 2L O2 [place].   Wheelchair Mobility    Modified Rankin (Stroke Patients Only)       Balance Overall balance assessment: Needs  assistance Sitting-balance support: No upper extremity supported, Feet supported Sitting balance-Leahy Scale: Normal     Standing balance support: No upper extremity supported Standing balance-Leahy Scale:  Poor (Poor to Fair) Standing balance comment: fair with rollator, poor without AD                            Cognition Arousal/Alertness: Awake/alert Behavior During Therapy: WFL for tasks assessed/performed Overall Cognitive Status: Within Functional Limits for tasks assessed                                          Exercises      General Comments General comments (skin integrity, edema, etc.): BP 139/75 (95) seated EOB; BP 112/73 (84) standing at EOB, pt without symptoms of lightheadedness; RN/MD notified in case pt needs meds changed or TED hose applied. Pt given handouts on ramp construction for her family and on Fall Risk Prevention at home to ensure safety due to pt recent history of multiple falls.      Pertinent Vitals/Pain Pain Assessment Pain Assessment: No/denies pain     PT Goals (current goals can now be found in the care plan section) Acute Rehab PT Goals Patient Stated Goal: to stop falling PT Goal Formulation: With patient Time For Goal Achievement: 10/17/22 Progress towards PT goals: Progressing toward goals    Frequency    Min 3X/week      PT Plan Current plan remains appropriate       AM-PAC PT "6 Clicks" Mobility   Outcome Measure  Help needed turning from your back to your side while in a flat bed without using bedrails?: None Help needed moving from lying on your back to sitting on the side of a flat bed without using bedrails?: None Help needed moving to and from a bed to a chair (including a wheelchair)?: A Little Help needed standing up from a chair using your arms (e.g., wheelchair or bedside chair)?: A Little Help needed to walk in hospital room?: A Little Help needed climbing 3-5 steps with a railing? : A Little 6 Click Score: 20    End of Session Equipment Utilized During Treatment: Gait belt;Oxygen Activity Tolerance: Patient tolerated treatment well Patient left: in bed;with call bell/phone within  reach Nurse Communication: Mobility status;Other (comment);Precautions (may benefit from TED hose if MD/RN agreeable; defer to MD) PT Visit Diagnosis: Unsteadiness on feet (R26.81);Muscle weakness (generalized) (M62.81)     Time: 1443-1540 PT Time Calculation (min) (ACUTE ONLY): 31 min  Charges:  $Gait Training: 8-22 mins $Therapeutic Activity: 8-22 mins                     Roger Kettles P., PTA Acute Rehabilitation Services Secure Chat Preferred 9a-5:30pm Office: [place] 10/06/2022, 11:43 AM

## 2022-10-06 NOTE — TOC Initial Note (Signed)
Transition of Care [facility]) - Initial/Assessment Note    Patient Details  Name: Brandi Hunter MRN: [id number] Date of Birth: [birth year]  [facility] CM/SW Contact:    Marilu Favre, RN Phone Number: 10/06/2022, 11:17 AM  Clinical Narrative:                 Please see TOC SW notes.   Patient plans to discharge to home today with son [name]. He will pick her up today at 2pm and bring her portable oxygen DME.   Discussed HHPT,OT,SW. Patient in agreement and has had [facility] in the past and would like them again.   Tommi Rumps with [facility] aware of concerns in home with [name] and accepted referral. NCM asked MD to add HHSW to orders for HHPT/OT   Expected Discharge Plan: [place] Barriers to Discharge: No Barriers Identified   Patient Goals and CMS Choice Patient states their goals for this hospitalization and ongoing recovery are:: to return to home CMS Medicare.gov Compare Post Acute Care list provided to:: Patient Choice offered to / list presented to : Patient  Expected Discharge Plan and Services Expected Discharge Plan: [place]   Discharge Planning Services: CM Consult Post Acute Care Choice: [place] arrangements for the past 2 months: Single Family Home Expected Discharge Date: 10/06/22                 DME Agency: NA       HH Arranged: PT, OT, Social Work [employer] Agency: [place] Date [facility] Agency Contacted: 10/06/22 Time HH Agency Contacted: 18 Representative spoke with at [place]: Tommi Rumps  Prior Living Arrangements/Services Living arrangements for the past 2 months: [place] Lives with:: Adult Children Patient language and need for interpreter reviewed:: Yes Do you feel safe going back to the place where you live?: Yes      Need for Family Participation in Patient Care: Yes (Comment) Care giver support system in place?: Yes (comment) Current home services: DME Criminal Activity/Legal  Involvement Pertinent to Current Situation/Hospitalization: No - Comment as needed  Activities of Daily Living      Permission Sought/Granted   Permission granted to share information with : No  Share Information with NAME: Son Brandi Hunter           Emotional Assessment Appearance:: Appears stated age Attitude/Demeanor/Rapport: Engaged Affect (typically observed): Accepting Orientation: : Oriented to Self, Oriented to Place, Oriented to  Time, Oriented to Situation Alcohol / Substance Use: Not Applicable Psych Involvement: No (comment)  Admission diagnosis:  SIRS (systemic inflammatory response syndrome) (HCC) [R65.10] Acute cystitis without hematuria [N30.00] Hypotension [I95.9] Hypotension, unspecified hypotension type [I95.9] Patient Active Problem List   Diagnosis Date Noted   Hypotension 10/01/2022   UTI (urinary tract infection) 10/01/2022   Nausea & vomiting 10/01/2022   Acute on chronic diastolic (congestive) heart failure ([place]) 05/06/2020   Acute on chronic diastolic congestive heart failure ([place]) 05/05/2020   Hypomagnesemia 05/05/2020   Stage 3b chronic kidney disease (CKD) ([place]) 05/05/2020   Persistent atrial fibrillation ([place]) 02/20/2020   Secondary hypercoagulable state ([place]) 02/20/2020   Acute and chronic respiratory failure (acute-on-chronic) ([place]) 08/30/2019   COPD exacerbation ([place]) 08/29/2019   Chest pain 07/18/2018   Sepsis due to urinary tract infection ([place]) 06/30/2018   AKI (acute kidney injury) ([place]) 06/30/2018   Sepsis, unspecified organism ([place]) 06/30/2018   Normocytic anemia 06/30/2018   Coronary artery disease due to lipid  rich plaque    Unstable angina (HCC) 03/23/2018   Acute respiratory distress 09/26/2017   Acute exacerbation of chronic obstructive pulmonary disease (COPD) ([place]) 09/26/2017   Dependence on nocturnal oxygen therapy 09/26/2017   Type 2 diabetes mellitus ([place]) 09/26/2017   Acute on chronic diastolic CHF (congestive heart failure)  ([place]) 09/26/2017   Cardiac pacemaker 11/17/2016   Eczema 11/17/2016   Major depression, recurrent, chronic ([place]) 09/17/2016   Axillary abscess 07/22/2016   Chronic left shoulder pain 10/01/2015   Xerosis cutis 10/01/2015   Paroxysmal atrial fibrillation (HCC) 05/24/2014   PAF (paroxysmal atrial fibrillation) ([place]) 05/24/2014   NAFLD (nonalcoholic fatty liver disease) 03/29/2014   Renal cyst 03/29/2014   Elevated TSH 03/06/2014   Type 2 diabetes mellitus without complication, without long-term current use of insulin ([place]) 03/06/2014   Peripheral neuropathy 04/27/2013   Vitamin B12 deficiency 04/27/2013   Abnormality of gait 03/29/2013   Diabetic neuropathy ([place]) 03/29/2013   OSA (obstructive sleep apnea) 08/26/2012   Chronic asthmatic bronchitis ([place]) 08/26/2012   Preop cardiovascular exam 08/15/2012   Nocturnal hypoxia 04/08/2012   Seborrheic dermatitis 10/21/2011   IBS (irritable bowel syndrome) 05/27/2011   Esophageal reflux 03/27/2011   Mixed hyperlipidemia 03/27/2011   Chronic obstructive pulmonary disease ([place]) 03/27/2011   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

## 2022-10-06 NOTE — Progress Notes (Incomplete)
DC education provided to patient. Patient verbalizes understanding  No complains of pain currently Patient leaves with all personal belongings

## 2022-11-05 ENCOUNTER — Other Ambulatory Visit: Payer: Self-pay | Admitting: Cardiology

## 2022-11-05 NOTE — Telephone Encounter (Signed)
Prescription refill request for Eliquis received.  Indication: afib  Last office visit: [place], 08/03/2022 Scr: 1.36, 10/12/2022  Age: 81 yo  Weight: 85.3 kg   Refill sent.

## 2022-12-11 ENCOUNTER — Ambulatory Visit (INDEPENDENT_AMBULATORY_CARE_PROVIDER_SITE_OTHER): Payer: Medicare HMO

## 2022-12-11 DIAGNOSIS — I441 Atrioventricular block, second degree: Secondary | ICD-10-CM | POA: Diagnosis not present

## 2022-12-14 LAB — CUP PACEART REMOTE DEVICE CHECK
Battery Impedance: 5501 Ohm
Battery Remaining Longevity: 7 mo
Battery Voltage: 2.62 V
Brady Statistic RV Percent Paced: 91 %
Date Time Interrogation Session: 20240129131035
Implantable Lead Connection Status: 753985
Implantable Lead Connection Status: 753985
Implantable Lead Implant Date: 20111121
Implantable Lead Implant Date: 20111121
Implantable Lead Location: 753859
Implantable Lead Location: 753860
Implantable Lead Model: 4469
Implantable Lead Model: 4470
Implantable Lead Serial Number: 548226
Implantable Lead Serial Number: 687643
Implantable Pulse Generator Implant Date: 20111121
Lead Channel Impedance Value: 480 Ohm
Lead Channel Impedance Value: 67 Ohm
Lead Channel Pacing Threshold Amplitude: 0.75 V
Lead Channel Pacing Threshold Pulse Width: 0.4 ms
Lead Channel Setting Pacing Amplitude: 2.5 V
Lead Channel Setting Pacing Pulse Width: 0.4 ms
Lead Channel Setting Sensing Sensitivity: 5.6 mV
Zone Setting Status: 755011
Zone Setting Status: 755011

## 2022-12-18 ENCOUNTER — Encounter: Payer: Medicare HMO | Admitting: Cardiovascular Disease

## 2022-12-24 ENCOUNTER — Other Ambulatory Visit: Payer: Self-pay | Admitting: *Deleted

## 2022-12-24 MED ORDER — APIXABAN 5 MG PO TABS: 5.0000 mg | ORAL_TABLET | Freq: Two times a day (BID) | ORAL | 1 refills | Status: DC

## 2022-12-24 MED ORDER — METOPROLOL SUCCINATE ER 100 MG PO TB24: 100.0000 mg | ORAL_TABLET | Freq: Every day | ORAL | 3 refills | Status: DC

## 2022-12-24 MED ORDER — FUROSEMIDE 20 MG PO TABS: 20.0000 mg | ORAL_TABLET | Freq: Every day | ORAL | 3 refills | Status: DC

## 2022-12-24 MED ORDER — DILTIAZEM HCL ER COATED BEADS 120 MG PO CP24: 120.0000 mg | ORAL_CAPSULE | Freq: Every day | ORAL | 3 refills | Status: DC

## 2022-12-24 MED ORDER — SPIRONOLACTONE 25 MG PO TABS: ORAL_TABLET | ORAL | 3 refills | Status: DC

## 2022-12-24 MED ORDER — MIDODRINE HCL 5 MG PO TABS: 5.0000 mg | ORAL_TABLET | Freq: Two times a day (BID) | ORAL | 3 refills | Status: DC

## 2022-12-24 NOTE — Telephone Encounter (Signed)
Prescription refill request for Eliquis received. Indication: AF Last office visit: 08/03/22  Myles Gip MD Scr: 1.52 on 10/04/22 Age: 83 Weight: 85.3kg  Based on above findings Eliquis '5mg'$  twice daily is the appropriate dose.  Refill approved.

## 2022-12-28 NOTE — Progress Notes (Signed)
Remote pacemaker transmission.   

## 2022-12-31 ENCOUNTER — Encounter: Payer: Medicare HMO | Admitting: Cardiovascular Disease

## 2023-01-15 ENCOUNTER — Other Ambulatory Visit: Payer: Self-pay | Admitting: *Deleted

## 2023-01-15 NOTE — Telephone Encounter (Signed)
Eliquis '5mg'$  refill request received. Patient is 82 years old, weight-85.3kg, Crea-1.52 on 10/04/2022, Diagnosis-Afib, and last seen by Dr. Domenic Polite on 08/03/22. Dose is appropriate based on dosing criteria.  Last refill was sent to requested pharmacy on 12/24/22 with 180 tabs & 1 refill. Will call pharmacy to see if this is a duplicate

## 2023-01-24 ENCOUNTER — Other Ambulatory Visit: Payer: Self-pay

## 2023-01-24 ENCOUNTER — Emergency Department (HOSPITAL_BASED_OUTPATIENT_CLINIC_OR_DEPARTMENT_OTHER)
Admission: EM | Admit: 2023-01-24 | Discharge: 2023-01-24 | Disposition: A | Discharge status: Home / Self Care | Payer: Medicare HMO | Attending: Emergency Medicine | Admitting: Emergency Medicine

## 2023-01-24 ENCOUNTER — Emergency Department (HOSPITAL_BASED_OUTPATIENT_CLINIC_OR_DEPARTMENT_OTHER): Payer: Medicare HMO

## 2023-01-24 ENCOUNTER — Encounter (HOSPITAL_BASED_OUTPATIENT_CLINIC_OR_DEPARTMENT_OTHER): Payer: Self-pay | Admitting: Emergency Medicine

## 2023-01-24 DIAGNOSIS — S0100XA Unspecified open wound of scalp, initial encounter: Secondary | ICD-10-CM | POA: Insufficient documentation

## 2023-01-24 DIAGNOSIS — E041 Nontoxic single thyroid nodule: Secondary | ICD-10-CM | POA: Diagnosis not present

## 2023-01-24 DIAGNOSIS — J45909 Unspecified asthma, uncomplicated: Secondary | ICD-10-CM | POA: Diagnosis not present

## 2023-01-24 DIAGNOSIS — I2510 Atherosclerotic heart disease of native coronary artery without angina pectoris: Secondary | ICD-10-CM | POA: Diagnosis not present

## 2023-01-24 DIAGNOSIS — E042 Nontoxic multinodular goiter: Secondary | ICD-10-CM

## 2023-01-24 DIAGNOSIS — Z79899 Other long term (current) drug therapy: Secondary | ICD-10-CM | POA: Diagnosis not present

## 2023-01-24 DIAGNOSIS — Z7984 Long term (current) use of oral hypoglycemic drugs: Secondary | ICD-10-CM | POA: Diagnosis not present

## 2023-01-24 DIAGNOSIS — W01198A Fall on same level from slipping, tripping and stumbling with subsequent striking against other object, initial encounter: Secondary | ICD-10-CM | POA: Insufficient documentation

## 2023-01-24 DIAGNOSIS — W19XXXA Unspecified fall, initial encounter: Secondary | ICD-10-CM

## 2023-01-24 DIAGNOSIS — E119 Type 2 diabetes mellitus without complications: Secondary | ICD-10-CM | POA: Diagnosis not present

## 2023-01-24 DIAGNOSIS — I10 Essential (primary) hypertension: Secondary | ICD-10-CM | POA: Insufficient documentation

## 2023-01-24 DIAGNOSIS — N390 Urinary tract infection, site not specified: Secondary | ICD-10-CM | POA: Insufficient documentation

## 2023-01-24 DIAGNOSIS — Z91040 Latex allergy status: Secondary | ICD-10-CM | POA: Insufficient documentation

## 2023-01-24 DIAGNOSIS — R5383 Other fatigue: Secondary | ICD-10-CM

## 2023-01-24 DIAGNOSIS — J449 Chronic obstructive pulmonary disease, unspecified: Secondary | ICD-10-CM | POA: Insufficient documentation

## 2023-01-24 DIAGNOSIS — S0990XA Unspecified injury of head, initial encounter: Secondary | ICD-10-CM

## 2023-01-24 DIAGNOSIS — Z7901 Long term (current) use of anticoagulants: Secondary | ICD-10-CM | POA: Diagnosis not present

## 2023-01-24 DIAGNOSIS — Z950 Presence of cardiac pacemaker: Secondary | ICD-10-CM | POA: Diagnosis not present

## 2023-01-24 LAB — COMPREHENSIVE METABOLIC PANEL
ALT: 12 U/L (ref 0–44)
AST: 21 U/L (ref 15–41)
Albumin: 3.2 g/dL — ABNORMAL LOW (ref 3.5–5.0)
Alkaline Phosphatase: 19 U/L — ABNORMAL LOW (ref 38–126)
Anion gap: 8 (ref 5–15)
BUN: 29 mg/dL — ABNORMAL HIGH (ref 8–23)
CO2: 32 mmol/L (ref 22–32)
Calcium: 9.4 mg/dL (ref 8.9–10.3)
Chloride: 102 mmol/L (ref 98–111)
Creatinine, Ser: 1.6 mg/dL — ABNORMAL HIGH (ref 0.44–1.00)
GFR, Estimated: 33 mL/min — ABNORMAL LOW (ref >60)
Glucose, Bld: 167 mg/dL — ABNORMAL HIGH (ref 70–99)
Potassium: 4.1 mmol/L (ref 3.5–5.1)
Sodium: 142 mmol/L (ref 135–145)
Total Bilirubin: 0.6 mg/dL (ref 0.3–1.2)
Total Protein: 5.9 g/dL — ABNORMAL LOW (ref 6.5–8.1)

## 2023-01-24 LAB — CBC WITH DIFFERENTIAL/PLATELET
Abs Immature Granulocytes: 0.01 10*3/uL (ref 0.00–0.07)
Basophils Absolute: 0 10*3/uL (ref 0.0–0.1)
Basophils Relative: 0 %
Eosinophils Absolute: 0.5 10*3/uL (ref 0.0–0.5)
Eosinophils Relative: 10 %
HCT: 33.1 % — ABNORMAL LOW (ref 36.0–46.0)
Hemoglobin: 10.7 g/dL — ABNORMAL LOW (ref 12.0–15.0)
Immature Granulocytes: 0 %
Lymphocytes Relative: 24 %
Lymphs Abs: 1.3 10*3/uL (ref 0.7–4.0)
MCH: 29.8 pg (ref 26.0–34.0)
MCHC: 32.3 g/dL (ref 30.0–36.0)
MCV: 92.2 fL (ref 80.0–100.0)
Monocytes Absolute: 0.4 10*3/uL (ref 0.1–1.0)
Monocytes Relative: 8 %
Neutro Abs: 3.2 10*3/uL (ref 1.7–7.7)
Neutrophils Relative %: 58 %
Platelets: 241 10*3/uL (ref 150–400)
RBC: 3.59 MIL/uL — ABNORMAL LOW (ref 3.87–5.11)
RDW: 15.2 % (ref 11.5–15.5)
WBC: 5.5 10*3/uL (ref 4.0–10.5)
nRBC: 0 % (ref 0.0–0.2)

## 2023-01-24 LAB — URINALYSIS, W/ REFLEX TO CULTURE (INFECTION SUSPECTED)
Bilirubin Urine: NEGATIVE
Glucose, UA: NEGATIVE mg/dL
Hgb urine dipstick: NEGATIVE
Ketones, ur: NEGATIVE mg/dL
Nitrite: POSITIVE — AB
Protein, ur: 30 mg/dL — AB
Specific Gravity, Urine: 1.019 (ref 1.005–1.030)
pH: 6.5 (ref 5.0–8.0)

## 2023-01-24 MED ORDER — CEPHALEXIN 500 MG PO CAPS: 500.0000 mg | ORAL_CAPSULE | Freq: Three times a day (TID) | ORAL | 0 refills | Status: AC

## 2023-01-24 NOTE — ED Triage Notes (Signed)
Pt is on eliquis for afib. Yesterday around 0700 she was walking from bathroom to bed and suddenly was turned 45 degrees,falling and hit back of head on chair. No LOC. She does report feeling fatigued more than usual today and yesterday. No neuro changes.

## 2023-01-24 NOTE — ED Notes (Signed)
Patient transported to CT 

## 2023-01-24 NOTE — ED Provider Notes (Signed)
[facility] Provider Note   CSN: [id number] Arrival date & time: 01/24/23  1239     History  Chief Complaint  Patient presents with   Brandi Hunter is a 82 y.o. female.  HPI     82yo female with history of CAD, COPD, htn, hlpd, pacemaker, PAF on eliquis, pancreatitis, OSA, type 2 DM, who presents with concern for fall with head injury, fatigue.  Fell yesterday around 7AM when got up to walk to the bathroom.  She lost balance as she does sometimes with vertigo history and fell hitting the back of her head on a wooden chair.  Got up, went back to bed.  Decided she didn't want to go in but talking to friend today decide she would come in to be checked out.  Hit back of head yesterday but no LOC. Mild headache. No nausea, vomiting, numbness, weakness, no chest pain, dyspnea, abdominal pain.  Does feel fatigue yesterday and today. Had wound back of head. UTD tetanus.    Past Medical History:  Diagnosis Date   Asthma    CAD (coronary artery disease)    a. catheterization in 03/2018 showing 90% ostial OM stenosis with medical management recommended at that time   Chronic anticoagulation    COPD (chronic obstructive pulmonary disease) (HCC)    DJD (degenerative joint disease)    Essential hypertension    GERD (gastroesophageal reflux disease)    History of home oxygen therapy    Hyperlipidemia    Mobitz (type) II atrioventricular block    Obesity    Pacemaker    Implanted by Dr [name] ([facility]) 10/06/10   PAF (paroxysmal atrial fibrillation) ([place])    Pancreatitis 2010 OR 2011   Sleep apnea    CPAP   Type 2 diabetes mellitus ([place])      Home Medications Prior to Admission medications   Medication Sig Start Date End Date Taking? Authorizing Provider  cephALEXin (KEFLEX) 500 MG capsule Take 1 capsule (500 mg total) by mouth 3 (three) times daily for 7 days. 01/24/23 01/31/23 Yes Gareth Morgan, MD  acetaminophen (TYLENOL) 650 MG  CR tablet Take 650 mg by mouth every 8 (eight) hours as needed for pain.    [provider]  albuterol (VENTOLIN HFA) 108 (90 Base) MCG/ACT inhaler Inhale 2 puffs into the lungs every 4 (four) hours as needed for wheezing.  07/21/12   [provider]  apixaban (ELIQUIS) 5 MG TABS tablet Take 1 tablet (5 mg total) by mouth 2 (two) times daily. 12/24/22   Satira Sark, MD  atorvastatin (LIPITOR) 40 MG tablet Take 40 mg by mouth at bedtime.  03/17/13   [provider]  budesonide-formoterol (SYMBICORT) 160-4.5 MCG/ACT inhaler Inhale 2 puffs into the lungs 2 (two) times daily.    [provider]  diclofenac Sodium (VOLTAREN) 1 % GEL Apply 2 g topically 2 (two) times daily as needed (for pain).  05/03/20   [provider]  diltiazem (CARDIZEM CD) 120 MG 24 hr capsule Take 1 capsule (120 mg total) by mouth daily. 12/24/22   Satira Sark, MD  DULoxetine (CYMBALTA) 30 MG capsule Take 30 mg by mouth at bedtime.  02/22/20   [provider]  famotidine (PEPCID) 20 MG tablet Take 20 mg by mouth daily. 09/06/19   [provider]  fenofibrate 160 MG tablet Take 160 mg by mouth every evening.    [provider]  furosemide (  LASIX) 20 MG tablet Take 1 tablet (20 mg total) by mouth daily. 12/24/22   Satira Sark, MD  gabapentin (NEURONTIN) 100 MG capsule Take 200 mg by mouth at bedtime. 12/18/20   [provider]  ipratropium-albuterol (DUONEB) 0.5-2.5 (3) MG/3ML SOLN Take 3 mLs by nebulization 4 (four) times daily. 08/31/19   Johnson, Clanford L, MD  LORazepam (ATIVAN) 0.5 MG tablet Take by mouth See admin instructions. Take half to one tablet (0.25 mg - 0.5 mg) by mouth twice daily as needed for anxiety 09/18/22   [provider]  metFORMIN (GLUCOPHAGE) 1000 MG tablet Take 500 mg by mouth 2 (two) times daily.  03/05/20   [provider]  metoprolol succinate (TOPROL-XL) 100 MG 24 hr tablet Take 1 tablet (100 mg total)  by mouth daily. Take with or immediately following a meal. 12/24/22   Satira Sark, MD  midodrine (PROAMATINE) 5 MG tablet Take 1 tablet (5 mg total) by mouth in the morning and at bedtime. 12/24/22   Satira Sark, MD  nitroGLYCERIN (NITROSTAT) 0.4 MG SL tablet DISSOLVE ONE TABLET UNDER THE TONGUE EVERY 5 MINUTES AS NEEDED FOR CHEST PAIN.  DO NOT EXCEED A TOTAL OF 3 DOSES IN 15 MINUTES Patient taking differently: Place 0.4 mg under the tongue every 5 (five) minutes as needed for chest pain. DO NOT EXCEED A TOTAL OF 3 DOSES IN 15 MINUTES 09/19/20   Satira Sark, MD  OXYGEN Inhale 2 L into the lungs continuous.    [provider]  Probiotic Product (ALIGN PO) Take 1 capsule by mouth daily.    [provider]  spironolactone (ALDACTONE) 25 MG tablet Take 1/2 (one-half) tablet by mouth once daily 12/24/22   Satira Sark, MD      Allergies    Bee venom and Latex    Review of Systems   Review of Systems  Physical Exam Updated Vital Signs BP (!) 145/68   Pulse 65   Temp 98.2 F (36.8 C) (Oral)   Resp 17   Ht '5\' 6"'$  (1.676 m)   Wt 85.3 kg   SpO2 100%   BMI 30.34 kg/m  Physical Exam Vitals and nursing note reviewed.  Constitutional:      General: She is not in acute distress.    Appearance: She is well-developed. She is not diaphoretic.  HENT:     Head: Normocephalic.     Comments: 1cm are healing hemostatic wound posterior scalp Eyes:     Conjunctiva/sclera: Conjunctivae normal.  Cardiovascular:     Rate and Rhythm: Normal rate and regular rhythm.     Heart sounds: Normal heart sounds. No murmur heard.    No friction rub. No gallop.  Pulmonary:     Effort: Pulmonary effort is normal. No respiratory distress.     Breath sounds: Normal breath sounds. No wheezing or rales.  Abdominal:     General: There is no distension.     Palpations: Abdomen is soft.     Tenderness: There is no abdominal tenderness. There is no guarding.  Musculoskeletal:         General: No tenderness.     Cervical back: Normal range of motion.  Skin:    General: Skin is warm and dry.     Findings: No erythema or rash.  Neurological:     Mental Status: She is alert and oriented to person, place, and time.     ED Results / Procedures / Treatments  Labs (all labs ordered are listed, but only abnormal results are displayed) Labs Reviewed  CBC WITH DIFFERENTIAL/PLATELET - Abnormal; Notable for the following components:      Result Value   RBC 3.59 (*)    Hemoglobin 10.7 (*)    HCT 33.1 (*)    All other components within normal limits  COMPREHENSIVE METABOLIC PANEL - Abnormal; Notable for the following components:   Glucose, Bld 167 (*)    BUN 29 (*)    Creatinine, Ser 1.60 (*)    Total Protein 5.9 (*)    Albumin 3.2 (*)    Alkaline Phosphatase 19 (*)    GFR, Estimated 33 (*)    All other components within normal limits  URINALYSIS, W/ REFLEX TO CULTURE (INFECTION SUSPECTED) - Abnormal; Notable for the following components:   APPearance HAZY (*)    Protein, ur 30 (*)    Nitrite POSITIVE (*)    Leukocytes,Ua MODERATE (*)    Bacteria, UA MANY (*)    All other components within normal limits  URINE CULTURE    EKG EKG Interpretation  Date/Time:  Sunday January 24 2023 13:28:25 EDT Ventricular Rate:  65 PR Interval:  163 QRS Duration: 131 QT Interval:  445 QTC Calculation: 463 R Axis:   -86 Text Interpretation: Sinus rhythm IVCD, consider atypical RBBB Left ventricular hypertrophy Probable inferior infarct, acute Anterolateral infarct, old No significant change since last tracing Confirmed by Gareth Morgan [phone]) on 01/24/2023 11:55:43 PM  Radiology CT Head Wo Contrast  Result Date: 01/24/2023 CLINICAL DATA:  Patient is status post fall. EXAM: CT HEAD WITHOUT CONTRAST CT CERVICAL SPINE WITHOUT CONTRAST TECHNIQUE: Multidetector CT imaging of the head and cervical spine was performed following the standard protocol without intravenous  contrast. Multiplanar CT image reconstructions of the cervical spine were also generated. RADIATION DOSE REDUCTION: This exam was performed according to the departmental dose-optimization program which includes automated exposure control, adjustment of the mA and/or kV according to patient size and/or use of iterative reconstruction technique. COMPARISON:  Brain CT 05/11/2011; multiple prior chest CTs including 05/23/2014. FINDINGS: CT HEAD FINDINGS Brain: Ventricles and sulci are appropriate for patient's age. No evidence for acute cortically based infarct, intracranial hemorrhage, mass lesion or mass effect. Vascular: No hyperdense vessel or unexpected calcification. Skull: Intact. Sinuses/Orbits: Paranasal sinuses are well aerated. Mastoid air cells are unremarkable. Other: Soft tissue swelling overlying the left posterior calvarium. CT CERVICAL SPINE FINDINGS Alignment: Normal. Skull base and vertebrae: No acute fracture. No primary bone lesion or focal pathologic process. Soft tissues and spinal canal: No prevertebral fluid or swelling. No visible canal hematoma. Disc levels: Multilevel degenerative disc disease throughout the cervical spine most pronounced C4-5, C5-6, C6-7 and C7-T1 with anterior endplate osteophytosis and posterior disc osteophyte complexes. Upper chest: Lung apices are clear. There is a 1.2 cm low-attenuation nodule within the right thyroid lobe. There is a 0.9 cm low-attenuation nodule is peripheral calcification in the left thyroid lobe. Other: None. IMPRESSION: 1. No acute intracranial process. Soft tissue swelling overlying the posterior calvarium. 2. No acute cervical spine fracture. 3. Multilevel degenerative disc disease. 4. Bilateral thyroid nodules. Given the size and appearance, if not previously performed, recommend further evaluation with nonurgent thyroid ultrasound. Electronically Signed   By: Lovey Newcomer M.D.   On: 01/24/2023 13:30   CT Cervical Spine Wo Contrast  Result  Date: 01/24/2023 CLINICAL DATA:  Patient is status post fall. EXAM: CT HEAD WITHOUT CONTRAST CT CERVICAL SPINE WITHOUT CONTRAST TECHNIQUE: Multidetector CT  imaging of the head and cervical spine was performed following the standard protocol without intravenous contrast. Multiplanar CT image reconstructions of the cervical spine were also generated. RADIATION DOSE REDUCTION: This exam was performed according to the departmental dose-optimization program which includes automated exposure control, adjustment of the mA and/or kV according to patient size and/or use of iterative reconstruction technique. COMPARISON:  Brain CT 05/11/2011; multiple prior chest CTs including 05/23/2014. FINDINGS: CT HEAD FINDINGS Brain: Ventricles and sulci are appropriate for patient's age. No evidence for acute cortically based infarct, intracranial hemorrhage, mass lesion or mass effect. Vascular: No hyperdense vessel or unexpected calcification. Skull: Intact. Sinuses/Orbits: Paranasal sinuses are well aerated. Mastoid air cells are unremarkable. Other: Soft tissue swelling overlying the left posterior calvarium. CT CERVICAL SPINE FINDINGS Alignment: Normal. Skull base and vertebrae: No acute fracture. No primary bone lesion or focal pathologic process. Soft tissues and spinal canal: No prevertebral fluid or swelling. No visible canal hematoma. Disc levels: Multilevel degenerative disc disease throughout the cervical spine most pronounced C4-5, C5-6, C6-7 and C7-T1 with anterior endplate osteophytosis and posterior disc osteophyte complexes. Upper chest: Lung apices are clear. There is a 1.2 cm low-attenuation nodule within the right thyroid lobe. There is a 0.9 cm low-attenuation nodule is peripheral calcification in the left thyroid lobe. Other: None. IMPRESSION: 1. No acute intracranial process. Soft tissue swelling overlying the posterior calvarium. 2. No acute cervical spine fracture. 3. Multilevel degenerative disc disease. 4.  Bilateral thyroid nodules. Given the size and appearance, if not previously performed, recommend further evaluation with nonurgent thyroid ultrasound. Electronically Signed   By: Lovey Newcomer M.D.   On: 01/24/2023 13:30    Procedures Procedures    Medications Ordered in ED Medications - No data to display  ED Course/ Medical Decision Making/ A&P                              82yo female with history of CAD, COPD, htn, hlpd, pacemaker, PAF on eliquis, pancreatitis, OSA, type 2 DM, who presents with concern for fall with head injury, fatigue.  CT head and CSpine completed given head injury on anticoagulation, age.  CT shows no acute fracture or hemorrhage.  Recommend follow up for thyroid nodules.  Low suspicion for other traumatic injuries.  Wound to posterior scalp, has been greater than 24hr, is hemostatic, recommend healing by secondary intention.   Given fatigue, obtained labs. Labs personally evaluated by me and show no clinically significant anemia, nor electrolyte abnormalities. ECG similar to prior.  UA consistent with UTI, may be etiology of fatigue.  Given rx for keflex. Patient discharged in stable condition with understanding of reasons to return.           Final Clinical Impression(s) / ED Diagnoses Final diagnoses:  Multiple thyroid nodules  Fall, initial encounter  Injury of head, initial encounter  Other fatigue  Urinary tract infection without hematuria, site unspecified  Open wound of scalp, unspecified open wound type, initial encounter    Rx / DC Orders ED Discharge Orders          Ordered    cephALEXin (KEFLEX) 500 MG capsule  3 times daily        01/24/23 1506              Gareth Morgan, MD 01/25/23 0002

## 2023-01-26 LAB — URINE CULTURE: Culture: >=100000 — AB

## 2023-01-27 ENCOUNTER — Telehealth (HOSPITAL_BASED_OUTPATIENT_CLINIC_OR_DEPARTMENT_OTHER): Payer: Self-pay

## 2023-01-27 NOTE — Telephone Encounter (Signed)
Post ED Visit - Positive Culture Follow-up  Culture report reviewed by antimicrobial stewardship pharmacist: [facility] Team '[x]'$  Bertis Ruddy, Pharm.D. '[]'$  Heide Guile, Pharm.D., BCPS AQ-ID '[]'$  Parks Neptune, Pharm.D., BCPS '[]'$  Alycia Rossetti, Pharm.D., BCPS '[]'$  [place], Pharm.D., BCPS, AAHIVP '[]'$  Legrand Como, Pharm.D., BCPS, AAHIVP '[]'$  Salome Arnt, PharmD, BCPS '[]'$  Johnnette Gourd, PharmD, BCPS '[]'$  Hughes Better, PharmD, BCPS '[]'$  Leeroy Cha, PharmD '[]'$  Laqueta Linden, PharmD, BCPS '[]'$  Albertina Parr, PharmD  [place] Team '[]'$  Leodis Sias, PharmD '[]'$  Lindell Spar, PharmD '[]'$  Royetta Asal, PharmD '[]'$  Graylin Shiver, Rph '[]'$  Rema Fendt) Glennon Mac, PharmD '[]'$  Arlyn Dunning, PharmD '[]'$  Netta Cedars, PharmD '[]'$  Dia Sitter, PharmD '[]'$  Leone Haven, PharmD '[]'$  Gretta Arab, PharmD '[]'$  Theodis Shove, PharmD '[]'$  Peggyann Juba, PharmD '[]'$  Reuel Boom, PharmD   Positive urine culture Treated with Cephalexin, organism sensitive to the same and no further patient follow-up is required at this time.  Glennon Hamilton 01/27/2023, 9:39 AM

## 2023-02-03 NOTE — Progress Notes (Unsigned)
    Cardiology Office Note  Date: 02/04/2023   ID: Brandi Hunter, DOB 1941/04/09, MRN [id number]  History of Present Illness: Brandi Hunter is an 82 y.o. female last seen in May 2023.  She is here today with a friend for follow-up.  Reports no interval sudden dizziness or syncope.  She has been tracking her blood pressure first thing in the morning with fairly high numbers.  We discussed taking her blood pressure an hour or 2 after her morning medications.  Also plan to reduce midodrine to 2.5 mg twice daily for now.  [employer]tronic pacemaker in place with follow-up by Dr. Myles Gip.  Device interrogation in January showed normal function with underlying permanent atrial fibrillation.  Based on age and recent renal function plan is to reduce Eliquis to 2.5 mg twice daily now as well.  She has done well on Cardizem CD and Toprol-XL, no palpitations.  Physical Exam: VS:  BP 128/80   Pulse 65   Ht 5\' 6"  (1.676 m)   Wt 178 lb 6.4 oz (80.9 kg)   SpO2 98%   BMI 28.79 kg/m , BMI Body mass index is 28.79 kg/m.  Wt Readings from Last 3 Encounters:  02/04/23 178 lb 6.4 oz (80.9 kg)  01/24/23 188 lb (85.3 kg)  10/01/22 188 lb 0.8 oz (85.3 kg)    General: Patient appears comfortable at rest. HEENT: Conjunctiva and lids normal. Neck: Supple, no elevated JVP or carotid bruits. Lungs: Clear to auscultation, nonlabored breathing at rest. Cardiac: Regular rate and rhythm, no S3, 1/6 systolic murmur. Extremities: No pitting edema.  ECG:  An ECG dated 01/24/2023 was personally reviewed today and demonstrated:  Ventricular pacing with underlying atrial fibrillation.  Labwork: April 2023: Cholesterol 132, triglycerides 155, HDL 34, LDL 71 01/24/2023: ALT 12; AST 21; BUN 29; Creatinine, Ser 1.60; Hemoglobin 10.7; Platelets 241; Potassium 4.1; Sodium 142   Other Studies Reviewed Today:  No interval cardiac testing for review today.  Assessment and Plan:  1.  Persistent atrial fibrillation with CHA2DS2-VASc  score of 6.  She is on Eliquis for stroke prophylaxis.  Plan is to reduce dose to 2.5 mg twice daily based on age and most recent renal function.  She does not report any spontaneous bleeding problems.  No palpitations on Cardizem CD and Toprol-XL which we will continue at current doses.  2.  Medtronic pacemaker in place with history of symptomatic second-degree heart block.  She continues to follow with Dr. Myles Gip.  3.  CAD by cardiac catheterization in 2019 showing branch vessel OM disease managed medically.  She does not report any angina.  Continue Lipitor and as needed nitroglycerin.  4.  History of hypertension but also orthostasis.  She is on ProAmatine.  Reduce dose to 2.5 mg twice daily for now.  Continue to track blood pressure.  Disposition:  Follow up  6 months.  Signed, Satira Sark, M.D., F.A.C.C.

## 2023-02-04 ENCOUNTER — Ambulatory Visit: Payer: Medicare HMO | Attending: Cardiology | Admitting: Cardiology

## 2023-02-04 ENCOUNTER — Encounter: Payer: Self-pay | Admitting: Cardiology

## 2023-02-04 VITALS — BP 128/80 | HR 65 | Ht 66.0 in | Wt 178.4 lb

## 2023-02-04 DIAGNOSIS — I25119 Atherosclerotic heart disease of native coronary artery with unspecified angina pectoris: Secondary | ICD-10-CM

## 2023-02-04 DIAGNOSIS — R42 Dizziness and giddiness: Secondary | ICD-10-CM

## 2023-02-04 DIAGNOSIS — I4819 Other persistent atrial fibrillation: Secondary | ICD-10-CM | POA: Diagnosis not present

## 2023-02-04 MED ORDER — NITROGLYCERIN 0.4 MG SL SUBL: 0.4000 mg | SUBLINGUAL_TABLET | SUBLINGUAL | 1 refills | Status: DC | PRN

## 2023-02-04 MED ORDER — APIXABAN 2.5 MG PO TABS: 2.5000 mg | ORAL_TABLET | Freq: Two times a day (BID) | ORAL | 3 refills | Status: DC

## 2023-02-04 MED ORDER — MIDODRINE HCL 2.5 MG PO TABS: 2.5000 mg | ORAL_TABLET | Freq: Two times a day (BID) | ORAL | 2 refills | Status: DC

## 2023-02-04 NOTE — Patient Instructions (Addendum)
Medication Instructions:  Your physician has recommended you make the following change in your medication:  Decrease eliquis to 2.5 mg twice daily Decrease midodrine to 2.5 mg twice daily Continue other medications the same  Labwork: none  Testing/Procedures: none  Follow-Up: Your physician recommends that you schedule a follow-up appointment in: 6 months  Any Other Special Instructions Will Be Listed Below (If Applicable).  If you need a refill on your cardiac medications before your next appointment, please call your pharmacy.

## 2023-02-19 ENCOUNTER — Ambulatory Visit: Payer: Medicare HMO | Attending: Internal Medicine | Admitting: Cardiovascular Disease

## 2023-02-19 ENCOUNTER — Encounter: Payer: Self-pay | Admitting: Cardiovascular Disease

## 2023-02-19 VITALS — BP 120/70 | HR 64 | Ht 66.0 in | Wt 175.8 lb

## 2023-02-19 DIAGNOSIS — Z45010 Encounter for checking and testing of cardiac pacemaker pulse generator [battery]: Secondary | ICD-10-CM | POA: Diagnosis not present

## 2023-02-19 DIAGNOSIS — I4821 Permanent atrial fibrillation: Secondary | ICD-10-CM | POA: Diagnosis not present

## 2023-02-19 NOTE — Progress Notes (Signed)
PCP: Medicine, [facility] Cardiologist: Dr Diona BrownerMcDowell Primary EP:  Dr Suzette BattiestMealor  Brandi Hunter is a 82 y.o. female who presents today for routine electrophysiology followup.  Since last being seen in our clinic, the patient reports doing reasonably well.  SOB is stable.  She is wearing O2 frequently.  She has some unsteadiness.  She did have a recent mechanical fall.  Today, she denies symptoms of palpitations, chest pain,  lower extremity edema, dizziness, presyncope, or syncope.  The patient is otherwise without complaint today.   Past Medical History:  Diagnosis Date   Asthma    CAD (coronary artery disease)    a. catheterization in 03/2018 showing 90% ostial OM stenosis with medical management recommended at that time   Chronic anticoagulation    COPD (chronic obstructive pulmonary disease)    DJD (degenerative joint disease)    Essential hypertension    GERD (gastroesophageal reflux disease)    History of home oxygen therapy    Hyperlipidemia    Mobitz (type) II atrioventricular block    Obesity    Pacemaker    Implanted by Dr Deborah Chalkennant (MDT) 10/06/10   PAF (paroxysmal atrial fibrillation)    Pancreatitis 2010 OR 2011   Sleep apnea    CPAP   Type 2 diabetes mellitus    Past Surgical History:  Procedure Laterality Date   CARDIOVERSION N/A 08/18/2019   Procedure: CARDIOVERSION;  Surgeon: Lewayne Buntingrenshaw, Brian S, MD;  Location[facility];  Service: Cardiovascular;  Laterality: N/A;   CATARACT EXTRACTION W/PHACO  11/02/2011   Procedure: CATARACT EXTRACTION PHACO AND INTRAOCULAR LENS PLACEMENT (IOC);  Surgeon: Susa Simmondsarroll F Haines;  Location: [facility];  Service: Ophthalmology;  Laterality: Right;  CDE=7.33   CATARACT EXTRACTION W/PHACO  12/07/2011   Procedure: CATARACT EXTRACTION PHACO AND INTRAOCULAR LENS PLACEMENT (IOC);  Surgeon: Susa Simmondsarroll F Haines, MD;  Location: [facility];  Service: Ophthalmology;  Laterality: Left;  CDE 3.61   COLONOSCOPY WITH PROPOFOL N/A 08/09/2014    Procedure: COLONOSCOPY WITH PROPOFOL;  Surgeon: Charna ElizabethJyothi Mann, MD;  Location: [facility];  Service: Endoscopy;  Laterality: N/A;   CYSTOCELE REPAIR     ESOPHAGOGASTRODUODENOSCOPY (EGD) WITH PROPOFOL N/A 08/09/2014   Procedure: ESOPHAGOGASTRODUODENOSCOPY (EGD) WITH PROPOFOL;  Surgeon: Charna ElizabethJyothi Mann, MD;  Location: [facility];  Service: Endoscopy;  Laterality: N/A;   EYE SURGERY  11/01/2012   BOTH EYES CATARACTS   INSERT / REPLACE / REMOVE PACEMAKER  10/06/10   MDT  implanted by Dr Deborah Chalkennant   KNEE ARTHROSCOPY     both   LEFT HEART CATH AND CORONARY ANGIOGRAPHY N/A 03/24/2018   Procedure: LEFT HEART CATH AND CORONARY ANGIOGRAPHY;  Surgeon: Runell GessBerry, Jonathan J, MD;  [facility];  Service: Cardiovascular;  Laterality: N/A;   OVARY SURGERY     removal   REPAIR RECTOCELE     SIMPLE MASTECTOMY WITH AXILLARY SENTINEL NODE BIOPSY Left 01/09/2015   Procedure: Irrigation and Drainage Abcess left Axilla;  Surgeon: Avel Peaceodd Rosenbower, MD;  Location: [facility];  Service: General;  Laterality: Left;   TONSILLECTOMY  AGE 82   TOTAL ABDOMINAL HYSTERECTOMY  1971    ROS- all systems are reviewed and negative except as per HPI above  Current Outpatient Medications  Medication Sig Dispense Refill   acetaminophen (TYLENOL) 650 MG CR tablet Take 650 mg by mouth every 8 (eight) hours as needed for pain.     albuterol (VENTOLIN HFA) 108 (90 Base) MCG/ACT inhaler Inhale 2 puffs into the lungs  every 4 (four) hours as needed for wheezing.      apixaban (ELIQUIS) 2.5 MG TABS tablet Take 1 tablet (2.5 mg total) by mouth 2 (two) times daily. 180 tablet 3   atorvastatin (LIPITOR) 40 MG tablet Take 40 mg by mouth at bedtime.      budesonide-formoterol (SYMBICORT) 160-4.5 MCG/ACT inhaler Inhale 2 puffs into the lungs 2 (two) times daily.     diclofenac Sodium (VOLTAREN) 1 % GEL Apply 2 g topically 2 (two) times daily as needed (for pain).      diltiazem (CARDIZEM CD) 120 MG 24 hr capsule Take 1 capsule (120 mg  total) by mouth daily. 90 capsule 3   DULoxetine (CYMBALTA) 30 MG capsule Take 30 mg by mouth at bedtime.      famotidine (PEPCID) 20 MG tablet Take 20 mg by mouth daily.     fenofibrate 160 MG tablet Take 160 mg by mouth every evening.     furosemide (LASIX) 20 MG tablet Take 1 tablet (20 mg total) by mouth daily. 90 tablet 3   gabapentin (NEURONTIN) 100 MG capsule Take 200 mg by mouth at bedtime.     ipratropium-albuterol (DUONEB) 0.5-2.5 (3) MG/3ML SOLN Take 3 mLs by nebulization 4 (four) times daily. 360 mL 1   LORazepam (ATIVAN) 0.5 MG tablet Take by mouth See admin instructions. Take half to one tablet (0.25 mg - 0.5 mg) by mouth twice daily as needed for anxiety     metFORMIN (GLUCOPHAGE) 500 MG tablet Take 500 mg by mouth daily.     metoprolol succinate (TOPROL-XL) 100 MG 24 hr tablet Take 1 tablet (100 mg total) by mouth daily. Take with or immediately following a meal. 90 tablet 3   midodrine (PROAMATINE) 2.5 MG tablet Take 1 tablet (2.5 mg total) by mouth 2 (two) times daily with a meal. 180 tablet 2   nitroGLYCERIN (NITROSTAT) 0.4 MG SL tablet Place 1 tablet (0.4 mg total) under the tongue every 5 (five) minutes x 3 doses as needed for chest pain (if no relief after 3rd dose, proceed to ED or call 911). 75 tablet 1   OXYGEN Inhale 2 L into the lungs continuous.     Probiotic Product (ALIGN PO) Take 1 capsule by mouth daily.     spironolactone (ALDACTONE) 25 MG tablet Take 1/2 (one-half) tablet by mouth once daily 45 tablet 3   No current facility-administered medications for this visit.    Physical Exam: Vitals:   02/19/23 0839  BP: 120/70  Pulse: 64  SpO2: 99%  Weight: 175 lb 12.8 oz (79.7 kg)  Height: 5\' 6"  (1.676 m)    Gen: Appears comfortable, well-nourished CV: RRR, no dependent edema The device site is normal -- no tenderness, edema, drainage, redness, threatened erosion. Pulm: breathing easily   Pacemaker interrogation- reviewed in detail today,  See PACEART  report    Assessment and Plan:  1. 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 Will schedule generator change.  I informed her of the indication and rationale, the risks including but not limited to: Infection, bleeding, damage to the existing leads.  The risk of major complications including death is very low but nonzero. she is not device dependant today  2. Permanent afib Chads2vasc score is 6.  She is on eliquis Rate controlled  3. HTN Stable No change required today  4. CAD No ischemic symptoms  5. Chronic diastolic dysfunction Stable No change  required today  Return in a year  Maurice SmallAugustus E Toriano Aikey, MD 02/19/2023 8:52 AM

## 2023-02-19 NOTE — Patient Instructions (Addendum)
Medication Instructions:  Continue all current medications.   Labwork: none  Testing/Procedures: Field seismologist - nurse will call   Follow-Up: Pending   Any Other Special Instructions Will Be Listed Below (If Applicable).   If you need a refill on your cardiac medications before your next appointment, please call your pharmacy.

## 2023-02-19 NOTE — H&P (View-Only) (Signed)
  PCP: Medicine, [facility] Cardiologist: Dr McDowell Primary EP:  Dr Rheannon Cerney  Brandi Hunter is a 82 y.o. female who presents today for routine electrophysiology followup.  Since last being seen in our clinic, the patient reports doing reasonably well.  SOB is stable.  She is wearing O2 frequently.  She has some unsteadiness.  She did have a recent mechanical fall.  Today, she denies symptoms of palpitations, chest pain,  lower extremity edema, dizziness, presyncope, or syncope.  The patient is otherwise without complaint today.   Past Medical History:  Diagnosis Date   Asthma    CAD (coronary artery disease)    a. catheterization in 03/2018 showing 90% ostial OM stenosis with medical management recommended at that time   Chronic anticoagulation    COPD (chronic obstructive pulmonary disease)    DJD (degenerative joint disease)    Essential hypertension    GERD (gastroesophageal reflux disease)    History of home oxygen therapy    Hyperlipidemia    Mobitz (type) II atrioventricular block    Obesity    Pacemaker    Implanted by Dr Tennant (MDT) 10/06/10   PAF (paroxysmal atrial fibrillation)    Pancreatitis 2010 OR 2011   Sleep apnea    CPAP   Type 2 diabetes mellitus    Past Surgical History:  Procedure Laterality Date   CARDIOVERSION N/A 08/18/2019   Procedure: CARDIOVERSION;  Surgeon: Crenshaw, Brian S, MD;  [facility]: [facility];  Service: Cardiovascular;  Laterality: N/A;   CATARACT EXTRACTION W/PHACO  11/02/2011   Procedure: CATARACT EXTRACTION PHACO AND INTRAOCULAR LENS PLACEMENT (IOC);  Surgeon: Carroll F Haines;  Location: [facility];  Service: Ophthalmology;  Laterality: Right;  CDE=7.33   CATARACT EXTRACTION W/PHACO  12/07/2011   Procedure: CATARACT EXTRACTION PHACO AND INTRAOCULAR LENS PLACEMENT (IOC);  Surgeon: Carroll F Haines, MD;  Location: [facility];  Service: Ophthalmology;  Laterality: Left;  CDE 3.61   COLONOSCOPY WITH PROPOFOL N/A 08/09/2014    Procedure: COLONOSCOPY WITH PROPOFOL;  Surgeon: Jyothi Mann, MD;  Location: [facility];  Service: Endoscopy;  Laterality: N/A;   CYSTOCELE REPAIR     ESOPHAGOGASTRODUODENOSCOPY (EGD) WITH PROPOFOL N/A 08/09/2014   Procedure: ESOPHAGOGASTRODUODENOSCOPY (EGD) WITH PROPOFOL;  Surgeon: Jyothi Mann, MD;  Location: [facility];  Service: Endoscopy;  Laterality: N/A;   EYE SURGERY  11/01/2012   BOTH EYES CATARACTS   INSERT / REPLACE / REMOVE PACEMAKER  10/06/10   MDT  implanted by Dr Tennant   KNEE ARTHROSCOPY     both   LEFT HEART CATH AND CORONARY ANGIOGRAPHY N/A 03/24/2018   Procedure: LEFT HEART CATH AND CORONARY ANGIOGRAPHY;  Surgeon: Berry, Jonathan J, MD;  [facility]: [facility];  Service: Cardiovascular;  Laterality: N/A;   OVARY SURGERY     removal   REPAIR RECTOCELE     SIMPLE MASTECTOMY WITH AXILLARY SENTINEL NODE BIOPSY Left 01/09/2015   Procedure: Irrigation and Drainage Abcess left Axilla;  Surgeon: [name] Rosenbower, MD;  Location: [facility];  Service: General;  Laterality: Left;   TONSILLECTOMY  AGE 7   TOTAL ABDOMINAL HYSTERECTOMY  1971    ROS- all systems are reviewed and negative except as per HPI above  Current Outpatient Medications  Medication Sig Dispense Refill   acetaminophen (TYLENOL) 650 MG CR tablet Take 650 mg by mouth every 8 (eight) hours as needed for pain.     albuterol (VENTOLIN HFA) 108 (90 Base) MCG/ACT inhaler Inhale 2 puffs into the lungs   every 4 (four) hours as needed for wheezing.      apixaban (ELIQUIS) 2.5 MG TABS tablet Take 1 tablet (2.5 mg total) by mouth 2 (two) times daily. 180 tablet 3   atorvastatin (LIPITOR) 40 MG tablet Take 40 mg by mouth at bedtime.      budesonide-formoterol (SYMBICORT) 160-4.5 MCG/ACT inhaler Inhale 2 puffs into the lungs 2 (two) times daily.     diclofenac Sodium (VOLTAREN) 1 % GEL Apply 2 g topically 2 (two) times daily as needed (for pain).      diltiazem (CARDIZEM CD) 120 MG 24 hr capsule Take 1 capsule (120 mg  total) by mouth daily. 90 capsule 3   DULoxetine (CYMBALTA) 30 MG capsule Take 30 mg by mouth at bedtime.      famotidine (PEPCID) 20 MG tablet Take 20 mg by mouth daily.     fenofibrate 160 MG tablet Take 160 mg by mouth every evening.     furosemide (LASIX) 20 MG tablet Take 1 tablet (20 mg total) by mouth daily. 90 tablet 3   gabapentin (NEURONTIN) 100 MG capsule Take 200 mg by mouth at bedtime.     ipratropium-albuterol (DUONEB) 0.5-2.5 (3) MG/3ML SOLN Take 3 mLs by nebulization 4 (four) times daily. 360 mL 1   LORazepam (ATIVAN) 0.5 MG tablet Take by mouth See admin instructions. Take half to one tablet (0.25 mg - 0.5 mg) by mouth twice daily as needed for anxiety     metFORMIN (GLUCOPHAGE) 500 MG tablet Take 500 mg by mouth daily.     metoprolol succinate (TOPROL-XL) 100 MG 24 hr tablet Take 1 tablet (100 mg total) by mouth daily. Take with or immediately following a meal. 90 tablet 3   midodrine (PROAMATINE) 2.5 MG tablet Take 1 tablet (2.5 mg total) by mouth 2 (two) times daily with a meal. 180 tablet 2   nitroGLYCERIN (NITROSTAT) 0.4 MG SL tablet Place 1 tablet (0.4 mg total) under the tongue every 5 (five) minutes x 3 doses as needed for chest pain (if no relief after 3rd dose, proceed to ED or call 911). 75 tablet 1   OXYGEN Inhale 2 L into the lungs continuous.     Probiotic Product (ALIGN PO) Take 1 capsule by mouth daily.     spironolactone (ALDACTONE) 25 MG tablet Take 1/2 (one-half) tablet by mouth once daily 45 tablet 3   No current facility-administered medications for this visit.    Physical Exam: Vitals:   02/19/23 0839  BP: 120/70  Pulse: 64  SpO2: 99%  Weight: 175 lb 12.8 oz (79.7 kg)  Height: 5' 6" (1.676 m)    Gen: Appears comfortable, well-nourished CV: RRR, no dependent edema The device site is normal -- no tenderness, edema, drainage, redness, threatened erosion. Pulm: breathing easily   Pacemaker interrogation- reviewed in detail today,  See PACEART  report    Assessment and Plan:  1. 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 Will schedule generator change.  I informed her of the indication and rationale, the risks including but not limited to: Infection, bleeding, damage to the existing leads.  The risk of major complications including death is very low but nonzero. she is not device dependant today  2. Permanent afib Chads2vasc score is 6.  She is on eliquis Rate controlled  3. HTN Stable No change required today  4. CAD No ischemic symptoms  5. Chronic diastolic dysfunction Stable No change   required today  Return in a year  Ramiro Pangilinan E Loretta Doutt, MD 02/19/2023 8:52 AM  

## 2023-03-01 ENCOUNTER — Telehealth: Payer: Self-pay | Admitting: *Deleted

## 2023-03-01 ENCOUNTER — Encounter: Payer: Self-pay | Admitting: *Deleted

## 2023-03-01 DIAGNOSIS — I441 Atrioventricular block, second degree: Secondary | ICD-10-CM

## 2023-03-01 DIAGNOSIS — Z01812 Encounter for preprocedural laboratory examination: Secondary | ICD-10-CM

## 2023-03-01 NOTE — Telephone Encounter (Signed)
Pt scheduled for PPM gen change 4/29 Reviewed instructions and sent via mychart. Pt will stop by [facility] between now and 4/26 for pre procedure blood work. Pt already has her scrub, was given at last OV. Patient verbalized understanding and agreeable to plan.

## 2023-03-10 LAB — BASIC METABOLIC PANEL
BUN/Creatinine Ratio: 22 (ref 12–28)
BUN: 31 mg/dL — ABNORMAL HIGH (ref 8–27)
CO2: 26 mmol/L (ref 20–29)
Calcium: 9.7 mg/dL (ref 8.7–10.3)
Chloride: 99 mmol/L (ref 96–106)
Creatinine, Ser: 1.39 mg/dL — ABNORMAL HIGH (ref 0.57–1.00)
Glucose: 112 mg/dL — ABNORMAL HIGH (ref 70–99)
Potassium: 4.6 mmol/L (ref 3.5–5.2)
Sodium: 141 mmol/L (ref 134–144)
eGFR: 38 mL/min/{1.73_m2} — ABNORMAL LOW (ref >59)

## 2023-03-10 LAB — CBC
Hematocrit: 36.3 % (ref 34.0–46.6)
Hemoglobin: 11.6 g/dL (ref 11.1–15.9)
MCH: 30.1 pg (ref 26.6–33.0)
MCHC: 32 g/dL (ref 31.5–35.7)
MCV: 94 fL (ref 79–97)
Platelets: 301 10*3/uL (ref 150–450)
RBC: 3.86 x10E6/uL (ref 3.77–5.28)
RDW: 13 % (ref 11.7–15.4)
WBC: 6.7 10*3/uL (ref 3.4–10.8)

## 2023-03-12 NOTE — Pre-Procedure Instructions (Signed)
Attempted to call patient regarding procedure instructions for Monday.  Unable to leave a voicemail, mailbox full.

## 2023-03-15 ENCOUNTER — Other Ambulatory Visit: Payer: Self-pay

## 2023-03-15 ENCOUNTER — Ambulatory Visit (HOSPITAL_COMMUNITY)
Admission: RE | Disposition: A | Discharge status: Home / Self Care | Payer: Self-pay | Source: Home / Self Care | Attending: Cardiovascular Disease

## 2023-03-15 ENCOUNTER — Ambulatory Visit (HOSPITAL_COMMUNITY)
Admission: RE | Admit: 2023-03-15 | Discharge: 2023-03-15 | Disposition: A | Discharge status: Home / Self Care | Payer: Medicare HMO | Attending: Cardiovascular Disease | Admitting: Cardiovascular Disease

## 2023-03-15 DIAGNOSIS — Z7901 Long term (current) use of anticoagulants: Secondary | ICD-10-CM | POA: Insufficient documentation

## 2023-03-15 DIAGNOSIS — I2510 Atherosclerotic heart disease of native coronary artery without angina pectoris: Secondary | ICD-10-CM | POA: Insufficient documentation

## 2023-03-15 DIAGNOSIS — I4821 Permanent atrial fibrillation: Secondary | ICD-10-CM | POA: Insufficient documentation

## 2023-03-15 DIAGNOSIS — I441 Atrioventricular block, second degree: Secondary | ICD-10-CM | POA: Insufficient documentation

## 2023-03-15 DIAGNOSIS — I5032 Chronic diastolic (congestive) heart failure: Secondary | ICD-10-CM | POA: Diagnosis not present

## 2023-03-15 DIAGNOSIS — Z45010 Encounter for checking and testing of cardiac pacemaker pulse generator [battery]: Secondary | ICD-10-CM | POA: Insufficient documentation

## 2023-03-15 DIAGNOSIS — I110 Hypertensive heart disease with heart failure: Secondary | ICD-10-CM | POA: Insufficient documentation

## 2023-03-15 HISTORY — PX: PPM GENERATOR CHANGEOUT: EP1233

## 2023-03-15 LAB — GLUCOSE, CAPILLARY
Glucose-Capillary: 111 mg/dL — ABNORMAL HIGH (ref 70–99)
Glucose-Capillary: 115 mg/dL — ABNORMAL HIGH (ref 70–99)

## 2023-03-15 SURGERY — PPM GENERATOR CHANGEOUT

## 2023-03-15 MED ORDER — LIDOCAINE HCL (PF) 1 % IJ SOLN
INTRAMUSCULAR | Status: AC
  Filled 2023-03-15: qty 60

## 2023-03-15 MED ORDER — CEFAZOLIN SODIUM-DEXTROSE 2-4 GM/100ML-% IV SOLN
INTRAVENOUS | Status: AC
  Administered 2023-03-15: 2 g via INTRAVENOUS
  Filled 2023-03-15: qty 100

## 2023-03-15 MED ORDER — SODIUM CHLORIDE 0.9 % IV SOLN
INTRAVENOUS | Status: AC
  Administered 2023-03-15: 80 mg
  Filled 2023-03-15: qty 2

## 2023-03-15 MED ORDER — MIDAZOLAM HCL 2 MG/2ML IJ SOLN
INTRAMUSCULAR | Status: AC
  Filled 2023-03-15: qty 2

## 2023-03-15 MED ORDER — ACETAMINOPHEN 325 MG PO TABS: 325.0000 mg | ORAL_TABLET | ORAL | Status: DC | PRN

## 2023-03-15 MED ORDER — FENTANYL CITRATE (PF) 100 MCG/2ML IJ SOLN
INTRAMUSCULAR | Status: DC | PRN
  Administered 2023-03-15: 25 ug via INTRAVENOUS

## 2023-03-15 MED ORDER — ONDANSETRON HCL 4 MG/2ML IJ SOLN: 4.0000 mg | Freq: Four times a day (QID) | INTRAMUSCULAR | Status: DC | PRN

## 2023-03-15 MED ORDER — CHLORHEXIDINE GLUCONATE 4 % EX SOLN: 4.0000 | Freq: Once | CUTANEOUS | Status: DC

## 2023-03-15 MED ORDER — MIDAZOLAM HCL 5 MG/5ML IJ SOLN
INTRAMUSCULAR | Status: DC | PRN
  Administered 2023-03-15: 1 mg via INTRAVENOUS

## 2023-03-15 MED ORDER — FENTANYL CITRATE (PF) 100 MCG/2ML IJ SOLN
INTRAMUSCULAR | Status: AC
  Filled 2023-03-15: qty 2

## 2023-03-15 MED ORDER — LIDOCAINE HCL (PF) 1 % IJ SOLN
INTRAMUSCULAR | Status: DC | PRN
  Administered 2023-03-15: 50 mL

## 2023-03-15 MED ORDER — CEFAZOLIN SODIUM-DEXTROSE 2-4 GM/100ML-% IV SOLN: 2.0000 g | INTRAVENOUS | Status: AC

## 2023-03-15 MED ORDER — SODIUM CHLORIDE 0.9 % IV SOLN: INTRAVENOUS | Status: DC

## 2023-03-15 MED ORDER — SODIUM CHLORIDE 0.9 % IV SOLN: 80.0000 mg | INTRAVENOUS | Status: AC

## 2023-03-15 SURGICAL SUPPLY — 6 items
CABLE SURGICAL S-101-97-12 (CABLE) ×1 IMPLANT
DEVICE DISSECT PLASMABLAD 3.0S (MISCELLANEOUS) IMPLANT
PACEMAKER ACCOLADE DR-EL (Pacemaker) IMPLANT
PAD DEFIB RADIO PHYSIO CONN (PAD) ×1 IMPLANT
PLASMABLADE 3.0S (MISCELLANEOUS) ×1
TRAY PACEMAKER INSERTION (PACKS) ×1 IMPLANT

## 2023-03-15 NOTE — Interval H&P Note (Signed)
History and Physical Interval Note:  03/15/2023 10:08 AM  Brandi Hunter  has presented today for surgery, with the diagnosis of bradicardia.  The various methods of treatment have been discussed with the patient and family. After consideration of risks, benefits and other options for treatment, the patient has consented to  Procedure(s): PPM GENERATOR CHANGEOUT (N/A) as a surgical intervention.  The patient's history has been reviewed, patient examined, no change in status, stable for surgery.  I have reviewed the patient's chart and labs.  Questions were answered to the patient's satisfaction.     Roberts Gaudy Antwaine Boomhower

## 2023-03-15 NOTE — Discharge Instructions (Signed)

## 2023-03-16 ENCOUNTER — Encounter (HOSPITAL_COMMUNITY): Payer: Self-pay | Admitting: Cardiovascular Disease

## 2023-03-16 MED FILL — Midazolam HCl Inj 2 MG/2ML (Base Equivalent): INTRAMUSCULAR | Qty: 1 | Status: AC

## 2023-03-25 ENCOUNTER — Ambulatory Visit: Payer: Medicare HMO | Attending: Internal Medicine

## 2023-03-25 DIAGNOSIS — I441 Atrioventricular block, second degree: Secondary | ICD-10-CM

## 2023-03-25 LAB — CUP PACEART INCLINIC DEVICE CHECK
Date Time Interrogation Session: 20240509145925
Implantable Lead Connection Status: 753985
Implantable Lead Connection Status: 753985
Implantable Lead Implant Date: 20111121
Implantable Lead Implant Date: 20111121
Implantable Lead Location: 753859
Implantable Lead Location: 753860
Implantable Lead Model: 4469
Implantable Lead Model: 4470
Implantable Lead Serial Number: 548226
Implantable Lead Serial Number: 687643
Implantable Pulse Generator Implant Date: 20240429
Lead Channel Impedance Value: 383 Ohm
Lead Channel Impedance Value: 406 Ohm
Lead Channel Pacing Threshold Amplitude: 0.6 V
Lead Channel Pacing Threshold Pulse Width: 0.4 ms
Lead Channel Setting Pacing Amplitude: 3 V
Lead Channel Setting Pacing Pulse Width: 0.4 ms
Lead Channel Setting Sensing Sensitivity: 3.5 mV
Pulse Gen Serial Number: 662700
Zone Setting Status: 755011

## 2023-03-25 NOTE — Patient Instructions (Signed)
After Your Pacemaker   Monitor your pacemaker site for redness, swelling, and drainage. Call the device clinic at [phone] if you experience these symptoms or fever/chills.  Your incision was closed with Steri-strips or staples:  You may shower 7 days after your procedure and wash your incision with soap and water. Avoid lotions, ointments, or perfumes over your incision until it is well-healed.  You may use a hot tub or a pool after your wound check appointment if the incision is completely closed.  There are no restrictions in arm movement after your wound check appointment.  You may drive, unless driving has been restricted by your healthcare providers.   Remote monitoring is used to monitor your pacemaker from home. This monitoring is scheduled every 91 days by our office. It allows us to keep an eye on the functioning of your device to ensure it is working properly. You will routinely see your Electrophysiologist annually (more often if necessary).  

## 2023-03-25 NOTE — Progress Notes (Signed)
Wound check appointment. Steri-strips removed. Wound without redness or edema. Incision edges approximated, wound well healed. Normal device function. Thresholds, sensing, and impedances consistent with implant measurements. [place] Poli distribution appropriate for patient and level of activity. No mode switches or high ventricular rates noted. Patient educated about wound care. ROV in 3 months with implanting physician.

## 2023-05-14 ENCOUNTER — Telehealth: Payer: Self-pay | Admitting: *Deleted

## 2023-05-14 NOTE — Telephone Encounter (Signed)
   Pre-operative Risk Assessment    Patient Name: Brandi Hunter  DOB: 1941/10/10 MRN: [id number]      Request for Surgical Clearance    Procedure:   ESI  Date of Surgery:  Clearance 06/15/23                                 Surgeon:  Dr. Peggye Pitt  Surgeon's Group or Practice Name:  [facility] in [place]   Phone number:  [phone] Fax number:  [phone]   Type of Clearance Requested:   - Medical  - Pharmacy:  Hold Apixaban (Eliquis) x 3 days    Type of Anesthesia:  Not Indicated   Additional requests/questions:   also scheduled for L5-S1 on 07/22/23  Elvin So   05/14/2023, 6:10 PM

## 2023-05-17 ENCOUNTER — Telehealth: Payer: Self-pay

## 2023-05-17 NOTE — Telephone Encounter (Signed)
Pt scheduled for tele visit on 05/31/23. Med rec and consent done

## 2023-05-17 NOTE — Telephone Encounter (Signed)
  Patient Consent for Virtual Visit         Brandi Hunter has provided verbal consent on 05/17/2023 for a virtual visit (video or telephone).   CONSENT FOR VIRTUAL VISIT FOR:  Brandi Hunter  By participating in this virtual visit I agree to the following:  I hereby voluntarily request, consent and authorize [facility] and its employed or contracted physicians, physician assistants, nurse practitioners or other licensed health care professionals (the Practitioner), to provide me with telemedicine health care services (the "Services") as deemed necessary by the treating Practitioner. I acknowledge and consent to receive the Services by the Practitioner via telemedicine. I understand that the telemedicine visit will involve communicating with the Practitioner through live audiovisual communication technology and the disclosure of certain medical information by electronic transmission. I acknowledge that I have been given the opportunity to request an in-person assessment or other available alternative prior to the telemedicine visit and am voluntarily participating in the telemedicine visit.  I understand that I have the right to withhold or withdraw my consent to the use of telemedicine in the course of my care at any time, without affecting my right to future care or treatment, and that the Practitioner or I may terminate the telemedicine visit at any time. I understand that I have the right to inspect all information obtained and/or recorded in the course of the telemedicine visit and may receive copies of available information for a reasonable fee.  I understand that some of the potential risks of receiving the Services via telemedicine include:  Delay or interruption in medical evaluation due to technological equipment failure or disruption; Information transmitted may not be sufficient (e.g. poor resolution of images) to allow for appropriate medical decision making by the Practitioner;  and/or  In rare instances, security protocols could fail, causing a breach of personal health information.  Furthermore, I acknowledge that it is my responsibility to provide information about my medical history, conditions and care that is complete and accurate to the best of my ability. I acknowledge that Practitioner's advice, recommendations, and/or decision may be based on factors not within their control, such as incomplete or inaccurate data provided by me or distortions of diagnostic images or specimens that may result from electronic transmissions. I understand that the practice of medicine is not an exact science and that Practitioner makes no warranties or guarantees regarding treatment outcomes. I acknowledge that a copy of this consent can be made available to me via my patient portal [facility] MyChart), or I can request a printed copy by calling the office of [facility].    I understand that my insurance will be billed for this visit.   I have read or had this consent read to me. I understand the contents of this consent, which adequately explains the benefits and risks of the Services being provided via telemedicine.  I have been provided ample opportunity to ask questions regarding this consent and the Services and have had my questions answered to my satisfaction. I give my informed consent for the services to be provided through the use of telemedicine in my medical care

## 2023-05-17 NOTE — Telephone Encounter (Signed)
Patient with diagnosis of PAF on Eliquis for anticoagulation.    Procedure: epidural steroid injection (ESI) Date of procedure: 06/15/2023   CHA2DS2-VASc Score = 7   This indicates a 11.2% annual risk of stroke. The patient's score is based upon: CHF History: 1 HTN History: 1 Diabetes History: 1 Stroke History: 0 Vascular Disease History: 1 Age Score: 2 Gender Score: 1     CrCl 34 mL/min (Adj BW, SrCr 1.39 on 03/09/2023) Platelet count 301 K on 03/09/2023    Per office protocol, patient can hold Eliquis for 3 days prior to procedure.     **This guidance is not considered finalized until pre-operative APP has relayed final recommendations.**

## 2023-05-17 NOTE — Telephone Encounter (Signed)
Pharmacy please advise on holding Eliquis prior to epidural ESI scheduled for 06/15/2023. Thank you.

## 2023-05-17 NOTE — Telephone Encounter (Signed)
   Name: Brandi Hunter  DOB: 11-12-1941  MRN: [id number]  Primary Cardiologist: Nona Dell, MD   Preoperative team, please contact this patient and set up a phone call appointment for further preoperative risk assessment. Please obtain consent and complete medication review. Thank you for your help.  I confirm that guidance regarding antiplatelet and oral anticoagulation therapy has been completed and, if necessary, noted below.   Per office protocol, patient can hold Eliquis for 3 days prior to procedure.   Napoleon Form, Leodis Rains, NP 05/17/2023, 8:31 AM [facility]

## 2023-05-31 ENCOUNTER — Ambulatory Visit: Payer: Medicare HMO | Attending: Cardiovascular Disease

## 2023-05-31 DIAGNOSIS — Z01810 Encounter for preprocedural cardiovascular examination: Secondary | ICD-10-CM | POA: Diagnosis not present

## 2023-05-31 NOTE — Progress Notes (Addendum)
Virtual Visit via Telephone Note   Because of Brandi Hunter's co-morbid illnesses, she is at least at moderate risk for complications without adequate follow up.  This format is felt to be most appropriate for this patient at this time.  The patient did not have access to video technology/had technical difficulties with video requiring transitioning to audio format only (telephone).  All issues noted in this document were discussed and addressed.  No physical exam could be performed with this format.  Please refer to the patient's chart for her consent to telehealth for [facility].  Evaluation Performed:  Preoperative cardiovascular risk assessment _____________   Date:  05/31/2023   Patient ID:  Brandi Hunter, DOB 09/16/1941, MRN [id number] Patient Location:  Home Provider location:   Office  Primary Care Provider:  Medicine, [facility] Primary Cardiologist:  Nona Dell, MD  Chief Complaint / Patient Profile   82 y.o. y/o female with a h/o coronary artery disease COPD, GERD, hyperlipidemia, type 2 diabetes who is pending ESI and presents today for telephonic preoperative cardiovascular risk assessment.  History of Present Illness    Brandi Hunter is a 82 y.o. female who presents via audio/video conferencing for a telehealth visit today.  Pt was last seen in cardiology clinic on 02/19/2023 by Dr. Nelly Laurence.  Her device was at [facility] generator change out was scheduled.  Underwent successful generator change out on 03/15/2023.   The patient is now pending procedure as outlined above. Since her last visit, she remained stable from a cardiac standpoint.  Did report some stable lightheadedness.  Symptoms are not new.  Today she denies chest pain, shortness of breath, lower extremity edema, fatigue, palpitations, melena, hematuria, hemoptysis, diaphoresis, weakness, presyncope, syncope, orthopnea, and PND.   Past Medical History    Past Medical History:  Diagnosis Date    Asthma    CAD (coronary artery disease)    a. catheterization in 03/2018 showing 90% ostial OM stenosis with medical management recommended at that time   Chronic anticoagulation    COPD (chronic obstructive pulmonary disease) (HCC)    DJD (degenerative joint disease)    Essential hypertension    GERD (gastroesophageal reflux disease)    History of home oxygen therapy    Hyperlipidemia    Mobitz (type) II atrioventricular block    Obesity    Pacemaker    Implanted by Dr Deborah Chalk (MDT) 10/06/10   PAF (paroxysmal atrial fibrillation) (HCC)    Pancreatitis 2010 OR 2011   Sleep apnea    CPAP   Type 2 diabetes mellitus (HCC)    Past Surgical History:  Procedure Laterality Date   CARDIOVERSION N/A 08/18/2019   Procedure: CARDIOVERSION;  Surgeon: Lewayne Bunting, MD;  Location: [facility] ENDOSCOPY;  Service: Cardiovascular;  Laterality: N/A;   CATARACT EXTRACTION W/PHACO  11/02/2011   Procedure: CATARACT EXTRACTION PHACO AND INTRAOCULAR LENS PLACEMENT (IOC);  Surgeon: Susa Simmonds;  Location: [facility] ORS;  Service: Ophthalmology;  Laterality: Right;  CDE=7.33   CATARACT EXTRACTION W/PHACO  12/07/2011   Procedure: CATARACT EXTRACTION PHACO AND INTRAOCULAR LENS PLACEMENT (IOC);  Surgeon: Susa Simmonds, MD;  Location: [facility] ORS;  Service: Ophthalmology;  Laterality: Left;  CDE 3.61   COLONOSCOPY WITH PROPOFOL N/A 08/09/2014   Procedure: COLONOSCOPY WITH PROPOFOL;  Surgeon: Charna Elizabeth, MD;  Location: [facility] ENDOSCOPY;  Service: Endoscopy;  Laterality: N/A;   CYSTOCELE REPAIR     ESOPHAGOGASTRODUODENOSCOPY (EGD) WITH PROPOFOL N/A 08/09/2014   Procedure: ESOPHAGOGASTRODUODENOSCOPY (EGD)  WITH PROPOFOL;  Surgeon: Charna Elizabeth, MD;  Location: [facility] ENDOSCOPY;  Service: Endoscopy;  Laterality: N/A;   EYE SURGERY  11/01/2012   BOTH EYES CATARACTS   INSERT / REPLACE / REMOVE PACEMAKER  10/06/10   MDT  implanted by Dr Deborah Chalk   KNEE ARTHROSCOPY     both   LEFT HEART CATH AND CORONARY ANGIOGRAPHY N/A 03/24/2018    Procedure: LEFT HEART CATH AND CORONARY ANGIOGRAPHY;  Surgeon: Runell Gess, MD;  Location: [facility] INVASIVE CV LAB;  Service: Cardiovascular;  Laterality: N/A;   OVARY SURGERY     removal   PPM GENERATOR CHANGEOUT N/A 03/15/2023   Procedure: PPM GENERATOR CHANGEOUT;  Surgeon: Nelly Laurence, Roberts Gaudy, MD;  Location: [facility] INVASIVE CV LAB;  Service: Cardiovascular;  Laterality: N/A;   REPAIR RECTOCELE     SIMPLE MASTECTOMY WITH AXILLARY SENTINEL NODE BIOPSY Left 01/09/2015   Procedure: Irrigation and Drainage Abcess left Axilla;  Surgeon: Avel Peace, MD;  Location: [facility] ORS;  Service: General;  Laterality: Left;   TONSILLECTOMY  AGE 54   TOTAL ABDOMINAL HYSTERECTOMY  1971    Allergies  Allergies  Allergen Reactions   Bee Venom Swelling   Latex Swelling    LATEX CATHETERS    Home Medications    Prior to Admission medications   Medication Sig Start Date End Date Taking? Authorizing Provider  acetaminophen (TYLENOL) 650 MG CR tablet Take 650 mg by mouth every 8 (eight) hours as needed for pain.    [provider]  albuterol (VENTOLIN HFA) 108 (90 Base) MCG/ACT inhaler Inhale 2 puffs into the lungs every 4 (four) hours as needed for wheezing.  07/21/12   [provider]  apixaban (ELIQUIS) 2.5 MG TABS tablet Take 1 tablet (2.5 mg total) by mouth 2 (two) times daily. 02/04/23   Jonelle Sidle, MD  atorvastatin (LIPITOR) 40 MG tablet Take 40 mg by mouth at bedtime.  03/17/13   [provider]  budesonide-formoterol (SYMBICORT) 160-4.5 MCG/ACT inhaler Inhale 2 puffs into the lungs 2 (two) times daily.    [provider]  diclofenac Sodium (VOLTAREN) 1 % GEL Apply 2 g topically 2 (two) times daily as needed (for pain).  05/03/20   [provider]  diltiazem (CARDIZEM CD) 120 MG 24 hr capsule Take 1 capsule (120 mg total) by mouth daily. 12/24/22   Jonelle Sidle, MD  DULoxetine (CYMBALTA) 30 MG capsule Take 30 mg by mouth at bedtime.  02/22/20   [provider]  famotidine (PEPCID) 20 MG tablet Take 20 mg by mouth daily as needed for heartburn or indigestion. 09/06/19   [provider]  fenofibrate 160 MG tablet Take 160 mg by mouth every evening.    [provider]  furosemide (LASIX) 20 MG tablet Take 1 tablet (20 mg total) by mouth daily. 12/24/22   Jonelle Sidle, MD  gabapentin (NEURONTIN) 100 MG capsule Take 200 mg by mouth at bedtime. 12/18/20   [provider]  ipratropium-albuterol (DUONEB) 0.5-2.5 (3) MG/3ML SOLN Take 3 mLs by nebulization 4 (four) times daily. Patient taking differently: Take 3 mLs by nebulization every 6 (six) hours as needed (Shortness of breath / Asthma). 08/31/19   Johnson, Clanford L, MD  LORazepam (ATIVAN) 0.5 MG tablet Take 0.5-1 mg by mouth See admin instructions. Take half to one tablet (0.25 mg - 0.5 mg) by mouth twice daily as needed for anxiety 09/18/22   [provider]  meclizine (ANTIVERT) 25 MG tablet Take 25  mg by mouth 3 (three) times daily as needed for dizziness.    [provider]  metFORMIN (GLUCOPHAGE) 500 MG tablet Take 500 mg by mouth daily. 03/05/20   [provider]  metoprolol succinate (TOPROL-XL) 100 MG 24 hr tablet Take 1 tablet (100 mg total) by mouth daily. Take with or immediately following a meal. 12/24/22   Jonelle Sidle, MD  midodrine (PROAMATINE) 2.5 MG tablet Take 1 tablet (2.5 mg total) by mouth 2 (two) times daily with a meal. 02/04/23   Jonelle Sidle, MD  nitroGLYCERIN (NITROSTAT) 0.4 MG SL tablet Place 1 tablet (0.4 mg total) under the tongue every 5 (five) minutes x 3 doses as needed for chest pain (if no relief after 3rd dose, proceed to ED or call 911). 02/04/23   Jonelle Sidle, MD  OXYGEN Inhale 2 L into the lungs continuous.    [provider]  spironolactone (ALDACTONE) 25 MG tablet Take 1/2 (one-half) tablet by mouth once daily 12/24/22   Jonelle Sidle, MD    Physical Exam    Vital Signs:   Iverna Hammac Urbanczyk does not have vital signs available for review today.  Given telephonic nature of communication, physical exam is limited. AAOx3. NAD. Normal affect.  Speech and respirations are unlabored.  Accessory Clinical Findings    None  Assessment & Plan    1.  Preoperative Cardiovascular Risk Assessment: ESI, 06/15/23,  Dr. Peggye Pitt , [facility] in [place]           Phone number:  [phone] Fax number:  [phone]      Primary Cardiologist: Nona Dell, MD  Chart reviewed as part of pre-operative protocol coverage. Given past medical history and time since last visit, based on ACC/AHA guidelines, Bernadean Saling Maahs would be at acceptable risk for the planned procedure without further cardiovascular testing.   Procedure: epidural steroid injection (ESI) Date of procedure: 06/15/2023     CHA2DS2-VASc Score = 7   This indicates a 11.2% annual risk of stroke. The patient's score is based upon: CHF History: 1 HTN History: 1 Diabetes History: 1 Stroke History: 0 Vascular Disease History: 1 Age Score: 2 Gender Score: 1       CrCl 34 mL/min (Adj BW, SrCr 1.39 on 03/09/2023) Platelet count 301 K on 03/09/2023       Per office protocol, patient can hold Eliquis for 3 days prior to procedure  Patient was advised that if she develops new symptoms prior to surgery to contact our office to arrange a follow-up appointment.  She verbalized understanding.  I will route this recommendation to the requesting party via Epic fax function and remove from pre-op pool.  Please call with questions.       Time:   Today, I have spent  minutes 6 with the patient with telehealth technology discussing medical history, symptoms, and management plan.  Prior to her phone evaluation I spent greater than 10 years reviewing her past medical history and cardiac medications.  Prior to her phone evaluation I spent greater than 10 minutes reviewing her past medical  history and cardiac medications.   Ronney Asters, NP  05/31/2023, 1:58 PM

## 2023-06-02 ENCOUNTER — Telehealth: Payer: Self-pay | Admitting: Cardiology

## 2023-06-02 NOTE — Telephone Encounter (Signed)
   Pre-operative Risk Assessment    Patient Name: Brandi Hunter  DOB: 1941/11/09 MRN: [id number]      Request for Surgical Clearance    Procedure:   CESI ON 06/15/23 & L5 S1 ESI on 07/22/23   Date of Surgery:  Clearance 06/15/23                                 Surgeon:  DAVID O'TOOLE  Surgeon's Group or Practice Name:  [facility] Phone number:  [phone] Fax number:  [phone]   Type of Clearance Requested:   - Pharmacy:  Hold Apixaban (Eliquis)     Type of Anesthesia:  Not Indicated   Additional requests/questions:    Merlene Morse   06/02/2023, 1:25 PM

## 2023-06-02 NOTE — Telephone Encounter (Signed)
     Primary Cardiologist: Nona Dell, MD  Chart reviewed as part of pre-operative protocol coverage. Given past medical history and time since last visit, based on ACC/AHA guidelines, Brandi Hunter would be at acceptable risk for the planned procedure without further cardiovascular testing.   Request for Surgical Clearance: L5 S1 ESI on 07/22/23   CHA2DS2-VASc Score = 7   This indicates a 11.2% annual risk of stroke. The patient's score is based upon: CHF History: 1 HTN History: 1 Diabetes History: 1 Stroke History: 0 Vascular Disease History: 1 Age Score: 2 Gender Score: 1   CrCl 34 mL/min (Adj BW, SrCr 1.39 on 03/09/2023) Platelet count 301 K on 03/09/2023   Per office protocol, patient can hold Eliquis for 3 days prior to procedure.  Patient was advised during her telephone encounter on 05/31/23 that if she develops new symptoms prior to surgery to contact our office to arrange a follow-up appointment.  She verbalized understanding.  I will route this recommendation to the requesting party via Epic fax function and remove from pre-op pool.  Please call with questions.  [name] D. [facility] 06/02/2023, 1:35 PM

## 2023-06-14 ENCOUNTER — Ambulatory Visit (INDEPENDENT_AMBULATORY_CARE_PROVIDER_SITE_OTHER): Payer: Medicare HMO

## 2023-06-14 DIAGNOSIS — I441 Atrioventricular block, second degree: Secondary | ICD-10-CM

## 2023-06-14 LAB — CUP PACEART REMOTE DEVICE CHECK
Battery Remaining Longevity: 150 mo
Battery Remaining Percentage: 100 %
Brady Statistic RA Percent Paced: 0 %
Brady Statistic RV Percent Paced: 89 %
Date Time Interrogation Session: 20240729045100
Implantable Lead Connection Status: 753985
Implantable Lead Connection Status: 753985
Implantable Lead Implant Date: 20111121
Implantable Lead Implant Date: 20111121
Implantable Lead Location: 753859
Implantable Lead Location: 753860
Implantable Lead Model: 4469
Implantable Lead Model: 4470
Implantable Lead Serial Number: 548226
Implantable Lead Serial Number: 687643
Implantable Pulse Generator Implant Date: 20240429
Lead Channel Impedance Value: 405 Ohm
Lead Channel Setting Pacing Amplitude: 3 V
Lead Channel Setting Pacing Pulse Width: 0.4 ms
Lead Channel Setting Sensing Sensitivity: 3.5 mV
Pulse Gen Serial Number: 662700
Zone Setting Status: 755011

## 2023-06-18 ENCOUNTER — Encounter: Payer: Self-pay | Admitting: Cardiovascular Disease

## 2023-06-18 ENCOUNTER — Ambulatory Visit: Payer: Medicare HMO | Attending: Cardiovascular Disease | Admitting: Cardiovascular Disease

## 2023-06-18 VITALS — BP 142/88 | HR 70 | Ht 66.0 in | Wt 183.4 lb

## 2023-06-18 DIAGNOSIS — I4821 Permanent atrial fibrillation: Secondary | ICD-10-CM | POA: Diagnosis not present

## 2023-06-18 DIAGNOSIS — I441 Atrioventricular block, second degree: Secondary | ICD-10-CM

## 2023-06-18 LAB — CUP PACEART INCLINIC DEVICE CHECK
Date Time Interrogation Session: 20240802172803
Implantable Lead Connection Status: 753985
Implantable Lead Connection Status: 753985
Implantable Lead Implant Date: 20111121
Implantable Lead Implant Date: 20111121
Implantable Lead Location: 753859
Implantable Lead Location: 753860
Implantable Lead Model: 4469
Implantable Lead Model: 4470
Implantable Lead Serial Number: 548226
Implantable Lead Serial Number: 687643
Implantable Pulse Generator Implant Date: 20240429
Lead Channel Impedance Value: 410 Ohm
Lead Channel Impedance Value: 428 Ohm
Lead Channel Pacing Threshold Amplitude: 0.6 V
Lead Channel Pacing Threshold Pulse Width: 0.4 ms
Lead Channel Setting Pacing Amplitude: 2.5 V
Lead Channel Setting Pacing Pulse Width: 0.4 ms
Lead Channel Setting Sensing Sensitivity: 3.5 mV
Pulse Gen Serial Number: 662700
Zone Setting Status: 755011

## 2023-06-18 NOTE — Patient Instructions (Addendum)
Medication Instructions:  Continue all current medications.  Labwork: none  Testing/Procedures: none  Follow-Up: 1 year   Any Other Special Instructions Will Be Listed Below (If Applicable).  If you need a refill on your cardiac medications before your next appointment, please call your pharmacy.  

## 2023-06-18 NOTE — Progress Notes (Signed)
    PCP: Medicine, [facility] Primary Cardiologist: Dr Diona Browner Primary EP:  Dr Suzette Battiest Brandi Hunter is a 82 y.o. female who presents today for routine electrophysiology followup.  Since last being seen in our clinic, the patient reports doing reasonably well.  SOB is stable.  She is wearing O2 frequently.    She underwent a generator change on March 15, 2023.  She has recovered well from the procedure  Today, she denies symptoms of palpitations, chest pain,  lower extremity edema, dizziness, presyncope, or syncope.  The patient is otherwise without complaint today.     Physical Exam: Vitals:   06/18/23 0850  BP: (!) 142/88  Pulse: 70  SpO2: 95%  Weight: 183 lb 6.4 oz (83.2 kg)  Height: 5\' 6"  (1.676 m)     Gen: Appears comfortable, well-nourished CV: RRR, no dependent edema The device site is normal -- no tenderness, edema, drainage, redness, threatened erosion. Pulm: breathing easily   Pacemaker interrogation- reviewed in detail today,  See PACEART report    Assessment and Plan:  1. 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 Will schedule generator change.  I informed her of the indication and rationale, the risks including but not limited to: Infection, bleeding, damage to the existing leads.  The risk of major complications including death is very low but nonzero. she is not device dependant today  2. Permanent afib Chads2vasc score is 7.   She is on eliquis 2.5 -- based on the most recent creatinine 03/09/23 (1.39) she should be on eliquis 5; however I am not can make this change today since the prior 5 creatinine levels were greater than 1.5. Rate controlled  3. HTN Stable No change required today  4. CAD No ischemic symptoms  5. Chronic diastolic dysfunction Stable No change required today  Return in a year  [name], MD 06/18/2023 9:10 AM

## 2023-06-30 ENCOUNTER — Encounter: Payer: Self-pay | Admitting: Pulmonary Disease

## 2023-06-30 ENCOUNTER — Ambulatory Visit: Payer: Medicare HMO | Admitting: Pulmonary Disease

## 2023-06-30 VITALS — BP 102/62 | HR 79 | Ht 66.0 in | Wt 179.4 lb

## 2023-06-30 DIAGNOSIS — J9611 Chronic respiratory failure with hypoxia: Secondary | ICD-10-CM | POA: Diagnosis not present

## 2023-06-30 DIAGNOSIS — G4733 Obstructive sleep apnea (adult) (pediatric): Secondary | ICD-10-CM | POA: Diagnosis not present

## 2023-06-30 DIAGNOSIS — J4489 Other specified chronic obstructive pulmonary disease: Secondary | ICD-10-CM

## 2023-06-30 DIAGNOSIS — J432 Centrilobular emphysema: Secondary | ICD-10-CM

## 2023-06-30 NOTE — Progress Notes (Signed)
Pulmonary, Critical Care, and Sleep Medicine  Chief Complaint  Patient presents with   Follow-up    Breathing is unchanged. She has had some wheezing over the past 2-3 days- comes and goes. She rarely uses albuterol inhaler. Doing well with CPAP.     Past Surgical History:  She  has a past surgical history that includes Insert / replace / remove pacemaker (10/06/10); Total abdominal hysterectomy (1971); Cystocele repair; Ovary surgery; Repair rectocele; Knee arthroscopy; Cataract extraction w/PHACO (11/02/2011); Cataract extraction w/PHACO (12/07/2011); Eye surgery (11/01/2012); Tonsillectomy (AGE 26); Colonoscopy with propofol (N/A, 08/09/2014); Esophagogastroduodenoscopy (egd) with propofol (N/A, 08/09/2014); Simple mastectomy with axillary sentinel node biopsy (Left, 01/09/2015); LEFT HEART CATH AND CORONARY ANGIOGRAPHY (N/A, 03/24/2018); Cardioversion (N/A, 08/18/2019); and PPM GENERATOR CHANGEOUT (N/A, 03/15/2023).  Past Medical History:  CAD, DJD, HTN, GERD, HLD, Mobitz II s/p PM, PAF, Pancreatitis 2010, DM type 2  Constitutional:  BP 102/62 (BP Location: Left Arm, Cuff Size: Normal)   Pulse 79   Ht 5\' 6"  (1.676 m)   Wt 179 lb 6.4 oz (81.4 kg)   SpO2 98%   BMI 28.96 kg/m   Brief Summary:  Brandi Hunter is a 82 y.o. female former smoker with COPD/asthma, OSA, and OHS.      Subjective:   She is here with her son.   Doing well with new CPAP.  Has full face mask.  Not having sinus congestion or dry mouth.  Sleeping well.  Has occasional cough with wheeze.  Not bringing up sputum.  Denies chest pain.  Gets winded with minimal activity.  Has trouble doing activities at home because of this; he son helps a lot.  Hasn't needed to use albuterol much.  Physical Exam:   Appearance - well kempt, wearing oxygen   ENMT - no sinus tenderness, no oral exudate, no LAN, Mallampati 3 airway, no stridor  Respiratory - equal breath sounds bilaterally, no wheezing or rales  CV - s1s2  regular rate and rhythm, no murmurs  Ext - no clubbing, no edema  Skin - no rashes  Psych - normal mood and affect    Pulmonary testing:  PFT 09/27/12 >> FEV1 1.81 (79%), FEV1% 65, TLC 4.64 (86%), DLCO 77%, no BD  Chest Imaging:  CT chest 05/23/14 >> 5 mm nodule Rt lower lung stable since 2008 LDCT chest 06/28/20 >> atherosclerosis, mild centrilobular emphysema, scattered scarring, calcified granulomas, stable nodules up to 5.8 mm  Sleep Tests:  PSG 11/18/08 >> AHI 9 Auto CPAP 05/31/23 to 06/29/23 >> used on 30 of 30 nights with average 9 hrs 56 min.  Average AHI 0.7 with median CPAP 5 and 95 th percentile CPAP 7 cm H2O  Cardiac Tests:  Echo 05/06/20 >> EF 60 to 65%, mild LVH, grade 2 DD, mod elevation in PASP, severe LA/RA dilation, mild/mod MR  Social History:  She  reports that she quit smoking about 59 years ago. Her smoking use included cigarettes. She quit smokeless tobacco use about 11 years ago. She reports that she does not currently use alcohol after a past usage of about 1.0 standard drink of alcohol per week. She reports that she does not use drugs.  Family History:  Her family history includes Congestive Heart Failure in her father and mother; Diabetes in her brother; Osteoarthritis in her sister; Prostate cancer in her brother.     Assessment/Plan:   COPD with asthma and emphysema. - continue symbicort 160 two puffs bid - prn albuterol HFA and duoneb  Obstructive sleep apnea. - she is compliant with CPAP and reports benefit - she uses [employer] for her DME - current CPAP ordered on 03/25/22 - continue auto CPAP 5 to 15 cm H2O   Chronic respiratory failure with obesity hypoventilation syndrome. - 2 liters oxygen 24/7 - gets supplies through [employer]  Dyspnea on exertion. - deconditioning playing a significant role - discussed importance of maintaining a regular exercise routine as tolerated  Paroxysmal atrial fibrillation, Mobitz II s/p PM, Coronary artery  disease, Valvular heart disease. - followed by Dr. Simona Huh  and Dr. York Pellant with cardiology  Time Spent Involved in Patient Care on Day of Examination:  37 minutes  Follow up:   Patient Instructions  Follow up in 6 months  Medication List:   Allergies as of 06/30/2023       Reactions   Bee Venom Swelling   Latex Swelling   LATEX CATHETERS        Medication List        Accurate as of June 30, 2023 10:08 AM. If you have any questions, ask your nurse or doctor.          acetaminophen 650 MG CR tablet Commonly known as: TYLENOL Take 650 mg by mouth every 8 (eight) hours as needed for pain.   albuterol 108 (90 Base) MCG/ACT inhaler Commonly known as: VENTOLIN HFA Inhale 2 puffs into the lungs every 4 (four) hours as needed for wheezing.   apixaban 2.5 MG Tabs tablet Commonly known as: Eliquis Take 1 tablet (2.5 mg total) by mouth 2 (two) times daily.   atorvastatin 40 MG tablet Commonly known as: LIPITOR Take 40 mg by mouth at bedtime.   budesonide-formoterol 160-4.5 MCG/ACT inhaler Commonly known as: SYMBICORT Inhale 2 puffs into the lungs 2 (two) times daily.   diclofenac Sodium 1 % Gel Commonly known as: VOLTAREN Apply 2 g topically 2 (two) times daily as needed (for pain).   diltiazem 120 MG 24 hr capsule Commonly known as: CARDIZEM CD Take 1 capsule (120 mg total) by mouth daily.   DULoxetine 30 MG capsule Commonly known as: CYMBALTA Take 30 mg by mouth at bedtime.   famotidine 20 MG tablet Commonly known as: PEPCID Take 20 mg by mouth daily as needed for heartburn or indigestion.   fenofibrate 160 MG tablet Take 160 mg by mouth every evening.   furosemide 20 MG tablet Commonly known as: LASIX Take 1 tablet (20 mg total) by mouth daily.   gabapentin 100 MG capsule Commonly known as: NEURONTIN Take 200 mg by mouth at bedtime.   ipratropium-albuterol 0.5-2.5 (3) MG/3ML Soln Commonly known as: DUONEB Take 3 mLs by  nebulization 4 (four) times daily. What changed:  when to take this reasons to take this   LORazepam 0.5 MG tablet Commonly known as: ATIVAN Take 0.5-1 mg by mouth See admin instructions. Take half to one tablet (0.25 mg - 0.5 mg) by mouth twice daily as needed for anxiety   meclizine 25 MG tablet Commonly known as: ANTIVERT Take 25 mg by mouth 3 (three) times daily as needed for dizziness.   metFORMIN 500 MG tablet Commonly known as: GLUCOPHAGE Take 500 mg by mouth daily.   metoprolol succinate 100 MG 24 hr tablet Commonly known as: TOPROL-XL Take 1 tablet (100 mg total) by mouth daily. Take with or immediately following a meal.   midodrine 2.5 MG tablet Commonly known as: PROAMATINE Take 1 tablet (2.5 mg total) by mouth 2 (two) times  daily with a meal.   nitroGLYCERIN 0.4 MG SL tablet Commonly known as: NITROSTAT Place 1 tablet (0.4 mg total) under the tongue every 5 (five) minutes x 3 doses as needed for chest pain (if no relief after 3rd dose, proceed to ED or call 911).   OXYGEN Inhale 2 L into the lungs continuous.   spironolactone 25 MG tablet Commonly known as: ALDACTONE Take 1/2 (one-half) tablet by mouth once daily        Signature:  Coralyn Helling, MD [facility] Pulmonary/Critical Care Pager - [phone] 06/30/2023, 10:08 AM

## 2023-06-30 NOTE — Progress Notes (Signed)
Remote pacemaker transmission.   

## 2023-06-30 NOTE — Patient Instructions (Signed)
Follow up in 6 months 

## 2023-07-01 ENCOUNTER — Other Ambulatory Visit: Payer: Self-pay | Admitting: Adult Health Nurse Practitioner

## 2023-07-01 DIAGNOSIS — Z1231 Encounter for screening mammogram for malignant neoplasm of breast: Secondary | ICD-10-CM

## 2023-07-16 ENCOUNTER — Ambulatory Visit
Admission: RE | Admit: 2023-07-16 | Discharge: 2023-07-16 | Disposition: A | Discharge status: Home / Self Care | Payer: Medicare HMO | Source: Ambulatory Visit | Attending: Adult Health Nurse Practitioner | Admitting: Adult Health Nurse Practitioner

## 2023-07-16 DIAGNOSIS — Z1231 Encounter for screening mammogram for malignant neoplasm of breast: Secondary | ICD-10-CM

## 2023-08-24 ENCOUNTER — Ambulatory Visit: Payer: Medicare HMO | Attending: Cardiology | Admitting: Cardiology

## 2023-08-24 ENCOUNTER — Encounter: Payer: Self-pay | Admitting: Cardiology

## 2023-08-24 VITALS — BP 134/72 | HR 57 | Ht 66.0 in | Wt 183.0 lb

## 2023-08-24 DIAGNOSIS — I25119 Atherosclerotic heart disease of native coronary artery with unspecified angina pectoris: Secondary | ICD-10-CM

## 2023-08-24 DIAGNOSIS — R42 Dizziness and giddiness: Secondary | ICD-10-CM | POA: Diagnosis not present

## 2023-08-24 DIAGNOSIS — I441 Atrioventricular block, second degree: Secondary | ICD-10-CM | POA: Diagnosis not present

## 2023-08-24 DIAGNOSIS — I4819 Other persistent atrial fibrillation: Secondary | ICD-10-CM | POA: Diagnosis not present

## 2023-08-24 NOTE — Progress Notes (Signed)
Cardiology Office Note  Date: 08/24/2023   ID: Brandi Hunter, DOB Aug 03, 1941, MRN [id number]  History of Present Illness: Brandi Hunter is an 82 y.o. female last seen in March.  She is here today with her husband for a follow-up visit.  Reports no sense of palpitations, no increasing dyspnea with typical activities.  Medtronic pacemaker in place with follow-up by Dr. Nelly Laurence.  Device interrogation in August showed normal function, single brief episode of NSVT.  I reviewed her medications.  Current cardiovascular regimen includes Eliquis, Lipitor, Cardizem CD, Lasix, Aldactone, Toprol-XL, ProAmatine, and as needed nitroglycerin.  She does not report any spontaneous bleeding problems.  I went over her recent lab work, creatinine 1.79 and normal potassium, hemoglobin 12.7.  Physical Exam: VS:  BP 134/72 (BP Location: Left Arm, Patient Position: Sitting, Cuff Size: Normal)   Pulse (!) 57   Ht 5\' 6"  (1.676 m)   Wt 183 lb (83 kg)   SpO2 100%   BMI 29.54 kg/m , BMI Body mass index is 29.54 kg/m.  Wt Readings from Last 3 Encounters:  08/24/23 183 lb (83 kg)  06/30/23 179 lb 6.4 oz (81.4 kg)  06/18/23 183 lb 6.4 oz (83.2 kg)    General: Patient appears comfortable at rest. HEENT: Conjunctiva and lids normal. Neck: Supple, no elevated JVP or carotid bruits. Lungs: Clear to auscultation, nonlabored breathing at rest. Cardiac: Regular rate and rhythm, no S3, 1/6 systolic murmur. Extremities: No pitting edema.  ECG:  An ECG dated 01/26/2023 was personally reviewed today and demonstrated:  Ventricular paced rhythm with underlying atrial fibrillation.  Labwork: 01/24/2023: ALT 12; AST 21 03/09/2023: BUN 31; Creatinine, Ser 1.39; Hemoglobin 11.6; Platelets 301; Potassium 4.6; Sodium 141  September 2024: Hemoglobin 12.7, platelets 235, BUN 55, creatinine 1.79, potassium 4.8, AST 24, ALT 21  Other Studies Reviewed Today:  No interval cardiac testing for review today.  Assessment and  Plan:  1.  Persistent atrial fibrillation with CHA2DS2-VASc score of 7.  She is asymptomatic in terms of palpitations and continues on Eliquis 2.5 mg twice daily for stroke prophylaxis, current dose is correct.  She does not report any spontaneous bleeding problems.  Continue Cardizem CD and Toprol-XL as before.   2.  Medtronic pacemaker in place with history of symptomatic second-degree heart block.  She continues to follow with Dr. Nelly Laurence.   3.  CAD by cardiac catheterization in 2019 showing branch vessel OM disease managed medically.  No active angina with current level of activity.  Continue Lipitor and as needed nitroglycerin.   4.  History of primary hypertension but also orthostasis.  She is on ProAmatine along with Aldactone and her heart rate control regimen above.  No changes were made today.  5.  CKD stage IIIb, recent creatinine 1.79.  Disposition:  Follow up  6 months.  Signed, Jonelle Sidle, M.D., F.A.C.C. [facility]

## 2023-08-24 NOTE — Patient Instructions (Signed)
Medication Instructions:  Your physician recommends that you continue on your current medications as directed. Please refer to the Current Medication list given to you today.   Labwork: One today  Testing/Procedures: None today  Follow-Up: 6 months  Any Other Special Instructions Will Be Listed Below (If Applicable).  If you need a refill on your cardiac medications before your next appointment, please call your pharmacy.

## 2023-08-30 ENCOUNTER — Other Ambulatory Visit: Payer: Self-pay | Admitting: Cardiology

## 2023-09-29 ENCOUNTER — Ambulatory Visit (INDEPENDENT_AMBULATORY_CARE_PROVIDER_SITE_OTHER): Payer: Medicare HMO

## 2023-09-29 DIAGNOSIS — I441 Atrioventricular block, second degree: Secondary | ICD-10-CM | POA: Diagnosis not present

## 2023-09-29 LAB — CUP PACEART REMOTE DEVICE CHECK
Battery Remaining Longevity: 168 mo
Battery Remaining Percentage: 100 %
Brady Statistic RA Percent Paced: 0 %
Brady Statistic RV Percent Paced: 89 %
Date Time Interrogation Session: 20241113045000
Implantable Lead Connection Status: 753985
Implantable Lead Connection Status: 753985
Implantable Lead Implant Date: 20111121
Implantable Lead Implant Date: 20111121
Implantable Lead Location: 753859
Implantable Lead Location: 753860
Implantable Lead Model: 4469
Implantable Lead Model: 4470
Implantable Lead Serial Number: 548226
Implantable Lead Serial Number: 687643
Implantable Pulse Generator Implant Date: 20240429
Lead Channel Impedance Value: 370 Ohm
Lead Channel Setting Pacing Amplitude: 2.5 V
Lead Channel Setting Pacing Pulse Width: 0.4 ms
Lead Channel Setting Sensing Sensitivity: 3.5 mV
Pulse Gen Serial Number: 662700
Zone Setting Status: 755011

## 2023-10-20 NOTE — Progress Notes (Signed)
Remote pacemaker transmission.   

## 2023-11-19 ENCOUNTER — Other Ambulatory Visit: Payer: Self-pay | Admitting: Cardiology

## 2023-12-20 ENCOUNTER — Encounter: Payer: Self-pay | Admitting: Adult Health

## 2023-12-20 ENCOUNTER — Ambulatory Visit: Payer: Medicare HMO

## 2023-12-20 ENCOUNTER — Ambulatory Visit: Payer: Medicare HMO | Admitting: Adult Health

## 2023-12-20 VITALS — BP 108/70 | HR 75 | Temp 98.4°F | Ht 66.0 in | Wt 190.0 lb

## 2023-12-20 DIAGNOSIS — J9610 Chronic respiratory failure, unspecified whether with hypoxia or hypercapnia: Secondary | ICD-10-CM

## 2023-12-20 DIAGNOSIS — J4489 Other specified chronic obstructive pulmonary disease: Secondary | ICD-10-CM

## 2023-12-20 DIAGNOSIS — G4733 Obstructive sleep apnea (adult) (pediatric): Secondary | ICD-10-CM | POA: Diagnosis not present

## 2023-12-20 NOTE — Patient Instructions (Addendum)
Continue on Symbicort 2 puffs twice daily, rinse after use Albuterol inhaler or Duoneb as needed Continue on Oxygen 2 L Continue on CPAP at bedtime, wear all night long Follow-up in 6 months with Dr. Vassie Loll or Aila Terra NP -30 min slot -[facility]

## 2023-12-20 NOTE — Progress Notes (Unsigned)
@Patient  ID: Brandi Hunter, female    DOB: 10-01-1941, 83 y.o.   MRN: [id number]  Chief Complaint  Patient presents with   Follow-up    Referring provider: Medicine, [facility]*  HPI: 83 year old female followed for obstructive sleep apnea, COPD with asthma and emphysema, chronic respiratory failure with obesity hypoventilation syndrome on home oxygen Medical history significant for coronary artery disease, A-fib, valvular heart disease. Her son is also our Building services engineer.   TEST/EVENTS :  PFT 09/27/12 >> FEV1 1.81 (79%), FEV1% 65, TLC 4.64 (86%), DLCO 77%, no BD   Chest Imaging:  CT chest 05/23/14 >> 5 mm nodule Rt lower lung stable since 2008 LDCT chest 06/28/20 >> atherosclerosis, mild centrilobular emphysema, scattered scarring, calcified granulomas, stable nodules up to 5.8 mm   Sleep Tests:  PSG 11/18/08 >> AHI 9 Auto CPAP 05/31/23 to 06/29/23 >> used on 30 of 30 nights with average 9 hrs 56 min.  Average AHI 0.7 with median CPAP 5 and 95 th percentile CPAP 7 cm H2O   Cardiac Tests:  Echo 05/06/20 >> EF 60 to 65%, mild LVH, grade 2 DD, mod elevation in PASP, severe LA/RA dilation, mild/mod MR  12/20/2023 Follow up ; obstructive sleep apnea, COPD with asthma emphysema, chronic respiratory failure, OHS Patient presents for a 27-month follow-up.  Patient has obstructive sleep apnea.  She is on nocturnal CPAP with Oxygen.  Patient says she is doing well on CPAP.  She wears her CPAP every single night.  Cannot sleep without it.  Feels that she benefits from CPAP with decreased daytime sleepiness.  CPAP download shows excellent compliance with daily average usage at 11 hours.  Patient is on auto CPAP 5 to 15 cm H2O.  AHI 0.5/hour.  Daily average pressure at 7.7/hour. Uses full face mask. [employer].   She has COPD with asthma and emphysema.  She remains on Symbicort twice daily. No flare of cough or wheezing. No recent prednisone.Flu shot is up to date .Prevnar Burnett Kanaris /RSV  up to date.    She remains on oxygen 2 L. No increased oxygen demands.   Using rolling walker with seat. Uses wheelchair at times. Sedentary -not active.      Allergies  Allergen Reactions   Bee Venom Swelling   Latex Swelling    LATEX CATHETERS    Immunization History  Administered Date(s) Administered   Fluad Quad(high Dose 65+) 08/20/2016, 08/25/2017, 08/29/2021, 10/15/2023   Influenza Split 08/27/2011, 08/16/2012, 09/14/2012, 08/16/2013, 09/05/2014, 09/19/2014, 08/30/2015   Influenza, High Dose Seasonal PF 08/06/2015, 08/20/2016, 08/25/2017, 07/20/2018, 07/21/2019, 08/25/2020   Influenza, Seasonal, Injecte, Preservative Fre 09/01/2012, 08/16/2013, 09/05/2014   Influenza,inj,quad, With Preservative 09/01/2012   Influenza,trivalent, recombinat, inj, PF 08/16/2012, 08/16/2013, 09/05/2014, 08/30/2015   Influenza-Unspecified 09/01/2012, 08/16/2013, 09/05/2014, 08/20/2016, 08/25/2017, 08/25/2020   Moderna SARS-COV2 Booster Vaccination 08/16/2021, 10/15/2023   Moderna Sars-Covid-2 Vaccination 12/15/2019, 01/12/2020, 10/29/2020   PNEUMOCOCCAL CONJUGATE-20 03/06/2021   Pneumococcal Conjugate-13 11/16/2008, 12/06/2014, 06/13/2019   Pneumococcal Polysaccharide-23 11/16/2008, 11/19/2017   Tdap 09/05/2014, 03/06/2021   Zoster Recombinant(Shingrix) 06/13/2019, 08/24/2019   Zoster, Live 09/16/2012    Past Medical History:  Diagnosis Date   Asthma    CAD (coronary artery disease)    a. catheterization in 03/2018 showing 90% ostial OM stenosis with medical management recommended at that time   Chronic anticoagulation    COPD (chronic obstructive pulmonary disease) (HCC)    DJD (degenerative joint disease)    Essential hypertension    GERD (gastroesophageal reflux disease)    History  of home oxygen therapy    Hyperlipidemia    Mobitz (type) II atrioventricular block    Obesity    Pacemaker    Implanted by Dr Deborah Chalk (MDT) 10/06/10   PAF (paroxysmal atrial fibrillation) (HCC)    Pancreatitis  2010 OR 2011   Sleep apnea    CPAP   Type 2 diabetes mellitus (HCC)     Tobacco History: Social History   Tobacco Use  Smoking Status Former   Current packs/day: 0.00   Types: Cigarettes   Quit date: 09/23/1963   Years since quitting: 60.2  Smokeless Tobacco Former   Quit date: 11/17/2011  Tobacco Comments   Stopped smoking in November 2018 for the last time.  12/20/2023 hfb   Counseling given: Not Answered Tobacco comments: Stopped smoking in November 2018 for the last time.  12/20/2023 hfb   Outpatient Medications Prior to Visit  Medication Sig Dispense Refill   acetaminophen (TYLENOL) 650 MG CR tablet Take 650 mg by mouth every 8 (eight) hours as needed for pain.     albuterol (VENTOLIN HFA) 108 (90 Base) MCG/ACT inhaler Inhale 2 puffs into the lungs every 4 (four) hours as needed for wheezing.      apixaban (ELIQUIS) 2.5 MG TABS tablet Take 1 tablet (2.5 mg total) by mouth 2 (two) times daily. 180 tablet 3   atorvastatin (LIPITOR) 40 MG tablet Take 40 mg by mouth at bedtime.      budesonide-formoterol (SYMBICORT) 160-4.5 MCG/ACT inhaler Inhale 2 puffs into the lungs 2 (two) times daily.     Cholecalciferol (VITAMIN D3) 1.25 MG (50000 UT) CAPS Take 1 capsule by mouth once a week.     Cyanocobalamin (B-12 PO) Take by mouth.     diclofenac Sodium (VOLTAREN) 1 % GEL Apply 2 g topically 2 (two) times daily as needed (for pain).      diltiazem (CARDIZEM CD) 120 MG 24 hr capsule TAKE 1 CAPSULE EVERY DAY 90 capsule 3   DULoxetine (CYMBALTA) 30 MG capsule Take 30 mg by mouth at bedtime.      famotidine (PEPCID) 20 MG tablet Take 20 mg by mouth daily as needed for heartburn or indigestion.     fenofibrate 160 MG tablet Take 160 mg by mouth every evening.     furosemide (LASIX) 20 MG tablet TAKE 1 TABLET EVERY DAY 90 tablet 3   gabapentin (NEURONTIN) 100 MG capsule Take 200 mg by mouth at bedtime.     ipratropium-albuterol (DUONEB) 0.5-2.5 (3) MG/3ML SOLN Take 3 mLs by nebulization 4 (four)  times daily. (Patient taking differently: Take 3 mLs by nebulization every 6 (six) hours as needed (Shortness of breath / Asthma).) 360 mL 1   LORazepam (ATIVAN) 0.5 MG tablet Take 0.5-1 mg by mouth See admin instructions. Take half to one tablet (0.25 mg - 0.5 mg) by mouth twice daily as needed for anxiety     meclizine (ANTIVERT) 25 MG tablet Take 25 mg by mouth 3 (three) times daily as needed for dizziness.     metFORMIN (GLUCOPHAGE) 500 MG tablet Take 500 mg by mouth daily.     metoprolol succinate (TOPROL-XL) 100 MG 24 hr tablet TAKE 1 TABLET (100 MG TOTAL) BY MOUTH DAILY. TAKE WITH OR IMMEDIATELY FOLLOWING A MEAL. 90 tablet 3   midodrine (PROAMATINE) 2.5 MG tablet TAKE 1 TABLET TWICE DAILY WITH MEALS (DOSE DECREASE) 180 tablet 3   nitroGLYCERIN (NITROSTAT) 0.4 MG SL tablet USE 1 TAB UNDER TONGUE EVERY 5 MINUTES AS NEEDED FOR  CHEST PAIN FOR 3 DOSES. IF NO RELIEF AFTER 3RD TAB GO TO ER OR CALL 911 75 tablet 3   OXYGEN Inhale 2 L into the lungs continuous.     spironolactone (ALDACTONE) 25 MG tablet TAKE 1/2 TABLET ONE TIME DAILY 45 tablet 3   No facility-administered medications prior to visit.     Review of Systems:   Constitutional:   No  weight loss, night sweats,  Fevers, chills, fatigue, or  lassitude.  HEENT:   No headaches,  Difficulty swallowing,  Tooth/dental problems, or  Sore throat,                No sneezing, itching, ear ache, nasal congestion, post nasal drip,   CV:  No chest pain,  Orthopnea, PND, swelling in lower extremities, anasarca, dizziness, palpitations, syncope.   GI  No heartburn, indigestion, abdominal pain, nausea, vomiting, diarrhea, change in bowel habits, loss of appetite, bloody stools.   Resp: No shortness of breath with exertion or at rest.  No excess mucus, no productive cough,  No non-productive cough,  No coughing up of blood.  No change in color of mucus.  No wheezing.  No chest wall deformity  Skin: no rash or lesions.  GU: no dysuria, change in  color of urine, no urgency or frequency.  No flank pain, no hematuria   MS:  No joint pain or swelling.  No decreased range of motion.  No back pain.    Physical Exam  BP 108/70 (BP Location: Left Arm, Patient Position: Sitting, Cuff Size: Large)   Pulse 75   Temp 98.4 F (36.9 C) (Oral)   Ht 5\' 6"  (1.676 m)   Wt 190 lb (86.2 kg)   SpO2 100%   BMI 30.67 kg/m   GEN: A/Ox3; pleasant , NAD, well nourished    HEENT:  [place]/AT,  EACs-clear, TMs-wnl, NOSE-clear, THROAT-clear, no lesions, no postnasal drip or exudate noted.   NECK:  Supple w/ fair ROM; no JVD; normal carotid impulses w/o bruits; no thyromegaly or nodules palpated; no lymphadenopathy.    RESP  Clear  P & A; w/o, wheezes/ rales/ or rhonchi. no accessory muscle use, no dullness to percussion  CARD:  RRR, no m/r/g, no peripheral edema, pulses intact, no cyanosis or clubbing.  GI:   Soft & nt; nml bowel sounds; no organomegaly or masses detected.   Musco: Warm bil, no deformities or joint swelling noted.   Neuro: alert, no focal deficits noted.    Skin: Warm, no lesions or rashes    Lab Results:  CBC    Component Value Date/Time   WBC 6.7 03/09/2023 1050   WBC 5.5 01/24/2023 1419   RBC 3.86 03/09/2023 1050   RBC 3.59 (L) 01/24/2023 1419   HGB 11.6 03/09/2023 1050   HCT 36.3 03/09/2023 1050   PLT 301 03/09/2023 1050   MCV 94 03/09/2023 1050   MCH 30.1 03/09/2023 1050   MCH 29.8 01/24/2023 1419   MCHC 32.0 03/09/2023 1050   MCHC 32.3 01/24/2023 1419   RDW 13.0 03/09/2023 1050   LYMPHSABS 1.3 01/24/2023 1419   LYMPHSABS 1.8 08/12/2018 1114   MONOABS 0.4 01/24/2023 1419   EOSABS 0.5 01/24/2023 1419   EOSABS 0.7 (H) 08/12/2018 1114   BASOSABS 0.0 01/24/2023 1419   BASOSABS 0.0 08/12/2018 1114    BMET    Component Value Date/Time   NA 141 03/09/2023 1050   K 4.6 03/09/2023 1050   CL 99 03/09/2023 1050   CO2  26 03/09/2023 1050   GLUCOSE 112 (H) 03/09/2023 1050   GLUCOSE 167 (H) 01/24/2023 1419    BUN 31 (H) 03/09/2023 1050   CREATININE 1.39 (H) 03/09/2023 1050   CALCIUM 9.7 03/09/2023 1050   GFRNONAA 33 (L) 01/24/2023 1419   GFRAA 40 (L) 06/20/2020 1246    BNP    Component Value Date/Time   BNP 1,222.0 (H) 05/05/2020 1536    ProBNP    Component Value Date/Time   PROBNP 257 10/20/2017 1033    Imaging: No results found.  Administration History     None           No data to display          No results found for: "NITRICOXIDE"      Assessment & Plan:   No problem-specific Assessment & Plan notes found for this encounter.     Rubye Oaks, NP 12/20/2023

## 2023-12-29 ENCOUNTER — Ambulatory Visit (INDEPENDENT_AMBULATORY_CARE_PROVIDER_SITE_OTHER): Payer: Medicare HMO

## 2023-12-29 DIAGNOSIS — I441 Atrioventricular block, second degree: Secondary | ICD-10-CM

## 2023-12-29 LAB — CUP PACEART REMOTE DEVICE CHECK
Battery Remaining Longevity: 162 mo
Battery Remaining Percentage: 100 %
Brady Statistic RA Percent Paced: 0 %
Brady Statistic RV Percent Paced: 94 %
Date Time Interrogation Session: 20250212045100
Implantable Lead Connection Status: 753985
Implantable Lead Connection Status: 753985
Implantable Lead Implant Date: 20111121
Implantable Lead Implant Date: 20111121
Implantable Lead Location: 753859
Implantable Lead Location: 753860
Implantable Lead Model: 4469
Implantable Lead Model: 4470
Implantable Lead Serial Number: 548226
Implantable Lead Serial Number: 687643
Implantable Pulse Generator Implant Date: 20240429
Lead Channel Impedance Value: 398 Ohm
Lead Channel Setting Pacing Amplitude: 2.5 V
Lead Channel Setting Pacing Pulse Width: 0.4 ms
Lead Channel Setting Sensing Sensitivity: 3.5 mV
Pulse Gen Serial Number: 662700
Zone Setting Status: 755011

## 2024-01-13 ENCOUNTER — Encounter: Payer: Self-pay | Admitting: Cardiovascular Disease

## 2024-02-02 NOTE — Progress Notes (Signed)
 Remote pacemaker transmission.

## 2024-02-04 ENCOUNTER — Other Ambulatory Visit: Payer: Self-pay | Admitting: Cardiology

## 2024-02-04 NOTE — Telephone Encounter (Signed)
 Prescription refill request for Eliquis received. Indication:afib Last office visit:10/24 Scr:1.39  4/24 Age: 83 Weight:86.2  kg  Prescription refilled

## 2024-02-28 ENCOUNTER — Ambulatory Visit: Payer: Medicare HMO | Admitting: Cardiology

## 2024-03-02 ENCOUNTER — Telehealth: Payer: Self-pay

## 2024-03-02 ENCOUNTER — Other Ambulatory Visit (HOSPITAL_COMMUNITY)
Admission: RE | Admit: 2024-03-02 | Discharge: 2024-03-02 | Disposition: A | Discharge status: Home / Self Care | Source: Ambulatory Visit | Attending: Cardiology | Admitting: Cardiology

## 2024-03-02 ENCOUNTER — Ambulatory Visit: Payer: Medicare HMO | Attending: Cardiology | Admitting: Cardiology

## 2024-03-02 ENCOUNTER — Encounter: Payer: Self-pay | Admitting: Cardiology

## 2024-03-02 VITALS — BP 140/82 | HR 71 | Ht 66.0 in | Wt 187.0 lb

## 2024-03-02 DIAGNOSIS — I441 Atrioventricular block, second degree: Secondary | ICD-10-CM

## 2024-03-02 DIAGNOSIS — I25119 Atherosclerotic heart disease of native coronary artery with unspecified angina pectoris: Secondary | ICD-10-CM | POA: Diagnosis not present

## 2024-03-02 DIAGNOSIS — I4819 Other persistent atrial fibrillation: Secondary | ICD-10-CM

## 2024-03-02 LAB — BASIC METABOLIC PANEL WITH GFR
Anion gap: 12 (ref 5–15)
BUN: 27 mg/dL — ABNORMAL HIGH (ref 8–23)
CO2: 28 mmol/L (ref 22–32)
Calcium: 9.3 mg/dL (ref 8.9–10.3)
Chloride: 97 mmol/L — ABNORMAL LOW (ref 98–111)
Creatinine, Ser: 1.36 mg/dL — ABNORMAL HIGH (ref 0.44–1.00)
GFR, Estimated: 39 mL/min — ABNORMAL LOW (ref >60)
Glucose, Bld: 146 mg/dL — ABNORMAL HIGH (ref 70–99)
Potassium: 3.5 mmol/L (ref 3.5–5.1)
Sodium: 137 mmol/L (ref 135–145)

## 2024-03-02 MED ORDER — APIXABAN 5 MG PO TABS
5.0000 mg | ORAL_TABLET | Freq: Two times a day (BID) | ORAL | 1 refills | Status: DC
Start: 2024-03-02 — End: 2024-05-10

## 2024-03-02 NOTE — Patient Instructions (Signed)
Medication Instructions:  Your physician recommends that you continue on your current medications as directed. Please refer to the Current Medication list given to you today.   Labwork: BMET today  Testing/Procedures: None today  Follow-Up: 6 months  Any Other Special Instructions Will Be Listed Below (If Applicable).  If you need a refill on your cardiac medications before your next appointment, please call your pharmacy.  

## 2024-03-02 NOTE — Telephone Encounter (Signed)
-----   Message from Brandi Hunter sent at 03/02/2024  3:26 PM EDT ----- Results reviewed.  On repeat lab work with creatinine 1.36 we should change her Eliquis back to 5 mg twice daily.

## 2024-03-02 NOTE — Progress Notes (Signed)
    Cardiology Office Note  Date: 03/02/2024   ID: Brandi Hunter, DOB Oct 31, 1941, MRN [id number]  History of Present Illness: Brandi Hunter is an 83 y.o. female last seen in October 2024.  She is here today for a follow-up visit.  Reports occasional and typically very brief chest discomfort not suggestive of angina.  No palpitations or syncope.  Medtronic pacemaker in place with follow-up by Dr. Arlester Ladd.  Device interrogation in February revealed normal function.  We reviewed her medications.  She does not report any spontaneous bleeding problems with Eliquis.  Lab work from December 2024 did show improvement in her renal function with creatinine of 1.18 and GFR 47 at that time.  I reviewed her ECG today which shows ventricular pacing with underlying atrial fibrillation.  Physical Exam: VS:  BP (!) 140/86   Pulse 71   Ht 5\' 6"  (1.676 m)   Wt 187 lb (84.8 kg)   SpO2 95%   BMI 30.18 kg/m , BMI Body mass index is 30.18 kg/m.  Wt Readings from Last 3 Encounters:  03/02/24 187 lb (84.8 kg)  12/20/23 190 lb (86.2 kg)  08/24/23 183 lb (83 kg)    General: Patient appears comfortable at rest. HEENT: Conjunctiva and lids normal. Neck: Supple, no elevated JVP or carotid bruits. Lungs: Clear to auscultation, nonlabored breathing at rest. Cardiac: RRR without gallop, 1/6 systolic murmur. Extremities: No pitting edema.  ECG:  An ECG dated 01/24/2023 was personally reviewed today and demonstrated:  Ventricular pacing with underlying atrial fibrillation.  Labwork: 03/09/2023: BUN 31; Creatinine, Ser 1.39; Hemoglobin 11.6; Platelets 301; Potassium 4.6; Sodium 141  December 2024: Hemoglobin 11.5, platelets 255, BUN 17, creatinine 1.18, GFR 47, potassium 4.1, AST 19, ALT 15, hemoglobin A1c 6.2%  Other Studies Reviewed Today:  No interval cardiac testing for review today.  Assessment and Plan:  1.  Persistent atrial fibrillation with CHA2DS2-VASc score of 7.  No significant palpitations.   Currently on Eliquis 2.5 mg twice daily, plan to update BMET given improvement in renal function as of November 2024.  She does not report any spontaneous bleeding problems.  Continue Toprol-XL 100 mg daily and Cardizem CD 120 mg daily.   2.  Medtronic pacemaker in place with history of symptomatic second-degree heart block.  She continues to follow with Dr. Arlester Ladd.   3.  CAD by cardiac catheterization in 2019 showing branch vessel OM disease managed medically continue medical therapy including Lipitor 40 mg daily and as needed nitroglycerin.   4.  History of primary hypertension but also orthostasis.  Blood pressure and symptoms have been stable.  Continue Aldactone 12.5 mg daily and midodrine 2.5 mg twice daily.   5.  CKD stage IIIa.  Last creatinine 1.18 with GFR 47.  Recheck BMET as discussed above.  Disposition:  Follow up  6 months.  Signed, Gerard Knight, M.D., F.A.C.C. [facility]

## 2024-03-02 NOTE — Telephone Encounter (Signed)
 Prescription refill request for Eliquis received. Indication: Afib  Last office visit:  03/02/24 Londa Rival)  Scr: 1.36 (02/28/24)  Age: 83 Weight: 84.8kg  Per Dr Londa Rival, pt's dose has been increased to 5mg  BID. Prescription sent.

## 2024-03-02 NOTE — Telephone Encounter (Signed)
 The patient has been notified of the result and verbalized understanding.  All questions (if any) were answered. Annabel Barns, RN 03/02/2024 3:44 PM     Patient will take two-2.5 mg Eliquis tablets twice a day as she awaits new rx from [employer].    I will forward to anticoag clinic

## 2024-03-29 ENCOUNTER — Ambulatory Visit (INDEPENDENT_AMBULATORY_CARE_PROVIDER_SITE_OTHER): Payer: Medicare HMO

## 2024-03-29 DIAGNOSIS — I441 Atrioventricular block, second degree: Secondary | ICD-10-CM | POA: Diagnosis not present

## 2024-03-29 LAB — CUP PACEART REMOTE DEVICE CHECK
Battery Remaining Longevity: 162 mo
Battery Remaining Percentage: 100 %
Brady Statistic RA Percent Paced: 0 %
Brady Statistic RV Percent Paced: 95 %
Date Time Interrogation Session: 20250514045100
Implantable Lead Connection Status: 753985
Implantable Lead Connection Status: 753985
Implantable Lead Implant Date: 20111121
Implantable Lead Implant Date: 20111121
Implantable Lead Location: 753859
Implantable Lead Location: 753860
Implantable Lead Model: 4469
Implantable Lead Model: 4470
Implantable Lead Serial Number: 548226
Implantable Lead Serial Number: 687643
Implantable Pulse Generator Implant Date: 20240429
Lead Channel Impedance Value: 381 Ohm
Lead Channel Setting Pacing Amplitude: 2.5 V
Lead Channel Setting Pacing Pulse Width: 0.4 ms
Lead Channel Setting Sensing Sensitivity: 3.5 mV
Pulse Gen Serial Number: 662700
Zone Setting Status: 755011

## 2024-03-31 ENCOUNTER — Ambulatory Visit: Payer: Self-pay | Admitting: Cardiovascular Disease

## 2024-04-11 ENCOUNTER — Telehealth: Payer: Self-pay | Admitting: Cardiology

## 2024-04-11 NOTE — Telephone Encounter (Signed)
 Spoke with pt who states that when she took her BP this morning 2 hours after taking her Midodrine  2.5 that it was 73/44 Hr 80. Pt states that she feels weak and lightheaded but that is not new for her. Pt states that BP on yesterday was 93/48. During phone call pt was asked to retake BP and it was noted to be 102/68 Hr 71. Pt does report a dull headache at the base of her skull. When asked she has only had 16 oz. Of tea today and 3-4 glasses of tea on yesterday. Pt denies drinking any water  yesterday or today. She states the her son that she lives with is currently in the hospital. She does have another son but he is working. Please advise.

## 2024-04-11 NOTE — Telephone Encounter (Signed)
 Pt c/o BP issue: STAT if pt c/o blurred vision, one-sided weakness or slurred speech.  STAT if BP is GREATER than 180/120 TODAY.  STAT if BP is LESS than 90/60 and SYMPTOMATIC TODAY  1. What is your BP concern? Pt concerned about her bp being low and headache   2. Have you taken any BP medication today? Yes, took diltiazem , metoprolol , and midodrine   3. What are your last 5 BP readings?   93/48 - 04/10/24   73/44 hr 80 - currently   4. Are you having any other symptoms (ex. Dizziness, headache, blurred vision, passed out)? Headache currently

## 2024-04-11 NOTE — Telephone Encounter (Signed)
 I relayed MD message to patient. She will take an extra midodrine  2.5 mg before she goes to bed tonight ane update us  in the morning.

## 2024-04-11 NOTE — Telephone Encounter (Signed)
 Office visit from 03/02/24 140/82, no medication changes   I asked patient to repeat bp she got 120/68,HR 70. I told her I suspected her machine is not working properly. She denies dizziness,near syncope.   Of note, she is using a wrist bp monitor. I advised her that our preference is an arm cuff, [employer] brand. She will have her son purchase one tonight.  I told her she could take tylenol  for headache and continue to monitor her BP later tonight. She also was informed that we do have after office hours contact with on-call providers.

## 2024-04-11 NOTE — Telephone Encounter (Signed)
 I had already encourage to stay hydrated and not miss any meals.

## 2024-04-12 NOTE — Telephone Encounter (Signed)
 Patient called back and took bp with wrist cuff, bp 140/80,HR79  States she feels much better today.

## 2024-04-12 NOTE — Telephone Encounter (Signed)
 Patient stated she is returning RN's call.

## 2024-05-09 ENCOUNTER — Encounter (HOSPITAL_COMMUNITY): Payer: Self-pay

## 2024-05-09 ENCOUNTER — Observation Stay (HOSPITAL_COMMUNITY)
Admission: EM | Admit: 2024-05-09 | Discharge: 2024-05-11 | Disposition: A | Discharge status: Home / Self Care | Attending: Infectious Diseases | Admitting: Infectious Diseases

## 2024-05-09 ENCOUNTER — Other Ambulatory Visit: Payer: Self-pay

## 2024-05-09 DIAGNOSIS — G4700 Insomnia, unspecified: Secondary | ICD-10-CM | POA: Diagnosis not present

## 2024-05-09 DIAGNOSIS — K219 Gastro-esophageal reflux disease without esophagitis: Secondary | ICD-10-CM | POA: Diagnosis not present

## 2024-05-09 DIAGNOSIS — N1832 Chronic kidney disease, stage 3b: Secondary | ICD-10-CM | POA: Diagnosis not present

## 2024-05-09 DIAGNOSIS — R1310 Dysphagia, unspecified: Secondary | ICD-10-CM | POA: Insufficient documentation

## 2024-05-09 DIAGNOSIS — Z950 Presence of cardiac pacemaker: Secondary | ICD-10-CM | POA: Insufficient documentation

## 2024-05-09 DIAGNOSIS — I959 Hypotension, unspecified: Secondary | ICD-10-CM

## 2024-05-09 DIAGNOSIS — J45909 Unspecified asthma, uncomplicated: Secondary | ICD-10-CM | POA: Insufficient documentation

## 2024-05-09 DIAGNOSIS — N179 Acute kidney failure, unspecified: Secondary | ICD-10-CM | POA: Diagnosis not present

## 2024-05-09 DIAGNOSIS — I2510 Atherosclerotic heart disease of native coronary artery without angina pectoris: Secondary | ICD-10-CM | POA: Diagnosis not present

## 2024-05-09 DIAGNOSIS — I4821 Permanent atrial fibrillation: Secondary | ICD-10-CM | POA: Insufficient documentation

## 2024-05-09 DIAGNOSIS — I441 Atrioventricular block, second degree: Secondary | ICD-10-CM | POA: Insufficient documentation

## 2024-05-09 DIAGNOSIS — E1140 Type 2 diabetes mellitus with diabetic neuropathy, unspecified: Secondary | ICD-10-CM | POA: Diagnosis not present

## 2024-05-09 DIAGNOSIS — I130 Hypertensive heart and chronic kidney disease with heart failure and stage 1 through stage 4 chronic kidney disease, or unspecified chronic kidney disease: Secondary | ICD-10-CM | POA: Diagnosis not present

## 2024-05-09 DIAGNOSIS — J449 Chronic obstructive pulmonary disease, unspecified: Secondary | ICD-10-CM | POA: Diagnosis not present

## 2024-05-09 DIAGNOSIS — Z91040 Latex allergy status: Secondary | ICD-10-CM | POA: Diagnosis not present

## 2024-05-09 DIAGNOSIS — E785 Hyperlipidemia, unspecified: Secondary | ICD-10-CM | POA: Insufficient documentation

## 2024-05-09 DIAGNOSIS — E875 Hyperkalemia: Secondary | ICD-10-CM | POA: Insufficient documentation

## 2024-05-09 DIAGNOSIS — I951 Orthostatic hypotension: Secondary | ICD-10-CM | POA: Diagnosis not present

## 2024-05-09 DIAGNOSIS — I5030 Unspecified diastolic (congestive) heart failure: Secondary | ICD-10-CM | POA: Insufficient documentation

## 2024-05-09 DIAGNOSIS — Z7984 Long term (current) use of oral hypoglycemic drugs: Secondary | ICD-10-CM | POA: Insufficient documentation

## 2024-05-09 DIAGNOSIS — G4733 Obstructive sleep apnea (adult) (pediatric): Secondary | ICD-10-CM | POA: Insufficient documentation

## 2024-05-09 DIAGNOSIS — Z7901 Long term (current) use of anticoagulants: Secondary | ICD-10-CM | POA: Diagnosis not present

## 2024-05-09 DIAGNOSIS — E1122 Type 2 diabetes mellitus with diabetic chronic kidney disease: Secondary | ICD-10-CM | POA: Insufficient documentation

## 2024-05-09 DIAGNOSIS — I4819 Other persistent atrial fibrillation: Secondary | ICD-10-CM

## 2024-05-09 LAB — CBC WITH DIFFERENTIAL/PLATELET
Abs Immature Granulocytes: 0.01 10*3/uL (ref 0.00–0.07)
Basophils Absolute: 0 10*3/uL (ref 0.0–0.1)
Basophils Relative: 1 %
Eosinophils Absolute: 0.2 10*3/uL (ref 0.0–0.5)
Eosinophils Relative: 4 %
HCT: 37.2 % (ref 36.0–46.0)
Hemoglobin: 11.6 g/dL — ABNORMAL LOW (ref 12.0–15.0)
Immature Granulocytes: 0 %
Lymphocytes Relative: 26 %
Lymphs Abs: 1.5 10*3/uL (ref 0.7–4.0)
MCH: 29.4 pg (ref 26.0–34.0)
MCHC: 31.2 g/dL (ref 30.0–36.0)
MCV: 94.2 fL (ref 80.0–100.0)
Monocytes Absolute: 0.6 10*3/uL (ref 0.1–1.0)
Monocytes Relative: 11 %
Neutro Abs: 3.5 10*3/uL (ref 1.7–7.7)
Neutrophils Relative %: 58 %
Platelets: 290 10*3/uL (ref 150–400)
RBC: 3.95 MIL/uL (ref 3.87–5.11)
RDW: 15 % (ref 11.5–15.5)
WBC: 5.8 10*3/uL (ref 4.0–10.5)
nRBC: 0 % (ref 0.0–0.2)

## 2024-05-09 LAB — BASIC METABOLIC PANEL WITH GFR
Anion gap: 10 (ref 5–15)
BUN: 50 mg/dL — ABNORMAL HIGH (ref 8–23)
CO2: 30 mmol/L (ref 22–32)
Calcium: 9.3 mg/dL (ref 8.9–10.3)
Chloride: 101 mmol/L (ref 98–111)
Creatinine, Ser: 2.75 mg/dL — ABNORMAL HIGH (ref 0.44–1.00)
GFR, Estimated: 17 mL/min — ABNORMAL LOW (ref >60)
Glucose, Bld: 154 mg/dL — ABNORMAL HIGH (ref 70–99)
Potassium: 4.3 mmol/L (ref 3.5–5.1)
Sodium: 141 mmol/L (ref 135–145)

## 2024-05-09 LAB — HEMOGLOBIN A1C
Hgb A1c MFr Bld: 6.5 % — ABNORMAL HIGH (ref 4.8–5.6)
Mean Plasma Glucose: 139.85 mg/dL

## 2024-05-09 MED ORDER — DULOXETINE HCL 30 MG PO CPEP
30.0000 mg | ORAL_CAPSULE | Freq: Every day | ORAL | Status: DC
  Administered 2024-05-09 – 2024-05-10 (×2): 30 mg via ORAL
  Filled 2024-05-09 (×2): qty 1

## 2024-05-09 MED ORDER — APIXABAN 2.5 MG PO TABS
2.5000 mg | ORAL_TABLET | Freq: Two times a day (BID) | ORAL | Status: DC
  Administered 2024-05-09 – 2024-05-11 (×4): 2.5 mg via ORAL
  Filled 2024-05-09 (×4): qty 1

## 2024-05-09 MED ORDER — FLUTICASONE FUROATE-VILANTEROL 200-25 MCG/ACT IN AEPB
1.0000 | INHALATION_SPRAY | Freq: Every day | RESPIRATORY_TRACT | Status: DC
  Administered 2024-05-10 – 2024-05-11 (×2): 1 via RESPIRATORY_TRACT
  Filled 2024-05-09: qty 28

## 2024-05-09 MED ORDER — DICLOFENAC SODIUM 1 % EX GEL
2.0000 g | Freq: Two times a day (BID) | CUTANEOUS | Status: DC | PRN
  Administered 2024-05-11: 2 g via TOPICAL
  Filled 2024-05-09: qty 100

## 2024-05-09 MED ORDER — ATORVASTATIN CALCIUM 40 MG PO TABS
40.0000 mg | ORAL_TABLET | Freq: Every day | ORAL | Status: DC
  Administered 2024-05-09 – 2024-05-10 (×2): 40 mg via ORAL
  Filled 2024-05-09 (×2): qty 1

## 2024-05-09 MED ORDER — GABAPENTIN 100 MG PO CAPS
200.0000 mg | ORAL_CAPSULE | Freq: Every day | ORAL | Status: DC
  Administered 2024-05-09 – 2024-05-10 (×2): 200 mg via ORAL
  Filled 2024-05-09 (×2): qty 2

## 2024-05-09 MED ORDER — IPRATROPIUM-ALBUTEROL 0.5-2.5 (3) MG/3ML IN SOLN: 3.0000 mL | Freq: Four times a day (QID) | RESPIRATORY_TRACT | Status: DC | PRN

## 2024-05-09 MED ORDER — MIDODRINE HCL 5 MG PO TABS
2.5000 mg | ORAL_TABLET | Freq: Once | ORAL | Status: AC
  Administered 2024-05-09: 2.5 mg via ORAL
  Filled 2024-05-09: qty 1

## 2024-05-09 MED ORDER — FENOFIBRATE 160 MG PO TABS
160.0000 mg | ORAL_TABLET | Freq: Every day | ORAL | Status: DC
  Administered 2024-05-09 – 2024-05-11 (×3): 160 mg via ORAL
  Filled 2024-05-09 (×3): qty 1

## 2024-05-09 MED ORDER — ACETAMINOPHEN 325 MG PO TABS
650.0000 mg | ORAL_TABLET | Freq: Three times a day (TID) | ORAL | Status: DC | PRN
  Administered 2024-05-10 – 2024-05-11 (×2): 650 mg via ORAL
  Filled 2024-05-09 (×2): qty 2

## 2024-05-09 MED ORDER — LACTATED RINGERS IV BOLUS
1000.0000 mL | Freq: Once | INTRAVENOUS | Status: AC
  Administered 2024-05-09: 1000 mL via INTRAVENOUS

## 2024-05-09 NOTE — ED Triage Notes (Signed)
 Pt coming in from home where a nurse from her insurance company came and took her blood pressure. Nurse from insurance found pt to be hypotensive. EMS arrived and found pt to have bp of 110/70 sitting and 70/40 standing. Pt has a history of Afib and recently has had a change in medication. Pt reports that they told her to cut down on one of her medications but she could not remember which one so she cut back her Midodrine . Pt recived 120 of NS from Ems. 18 Rac  Vitals  125/70 Paced hr 73 Spo2 100% on 2L baseline

## 2024-05-09 NOTE — ED Notes (Signed)
 Pt given [place] sandwich and water

## 2024-05-09 NOTE — ED Notes (Signed)
 Pt currently alert and oriented x4.

## 2024-05-09 NOTE — Hospital Course (Addendum)
 Brandi Hunter is a 83 year-old female with a PMH of HTN, HFpEF, permanent A-fib, T2DM, Mobitz type II second degree AV block s/p pacemaker placement, COPD on 2L [place], OSA, CKD stage 3b, GERD, CAD, and HLD who presented with low blood pressure and admitted on 05/09/2024 for orthostatic hypotension.   #Orthostatic hypotension Patient presented with orthostatic hypotension (110/70 standing, 70/40 sitting) in the setting of 3-4 weeks of lightheadedness. The lightheadedness started after patient mistakenly discontinued her midodrine  instead of metoprolol  following an outpatient cardiology appointment. The lightheadedness limited her ability to cook since she was unable to stand for more than a few seconds, so patient also had decreased PO intake for the past 3-4 weeks. Therefore, etiology of orthostatic hypotension was likely multifactorial with dehydration and midodrine  discontinuation both contributing. She initially appeared volume down on exam with dry mucous membranes and slightly prolonged cap refill, so she received 2.75L of IV fluids while hospitalized. She continued to experience orthostasis after fluid resuscitation (128/92 sitting, 79/65 standing), so she was started on midodrine  2.5 mg BID. Repeat orthostatics were negative (146/67 lying, 150/71 sitting, 131/90 standing). Her home spironolactone  25mg  daily, diltiazem  120mg  daily, metoprolol  XL 100 mg daily, and Lasix  20mg  once daily were held during hospitalization and at discharge. Her PCP can restart them if needed outpatient.    #AKI #CKD stage 3b #Hyperkalemia Admission labs notable for Cr 2.75 (GFR 17), which is elevated from her baseline of 1.3-1.6. Suspect AKI was secondary to decreased PO intake over the past several weeks as patient appeared dehydrated on exam. She received 2.75L of IV fluids while hospitalized with improvement in creatinine to 1.64. Potassium also mildly elevated at 5.2, which improved to 4.8 s/p Lokelma x1. Recommend repeat BMP  outpatient to assess kidney function. Her home diuretics were held as above.   #Dysphagia #GERD Patient reported globus sensation and food regurgitation when eating foods like breads and meats, which was concerning for esophageal stricture. Patient had a chronic history of GERD, dating back to at least 2012 on chart review. Most recent EGD in 07/2014 for GERD and nausea showed medium sized hiatal hernial. Patient was currently only taking pantoprazole  as needed, which was scheduled once daily while hospitalized. SLP was consulted and recommended esophagram. Esophagram results were pending at discharge. PCP can consider GI referral for possible EGD.   #HFpEF LVEF 60-65% 04/2020 #HTN Most recent echocardiogram in 04/2020 with EF 60-65%, no regional wall motion abnormalities, mild LVH with grade II diastolic dysfunction, severe left and right atrial dilation, mild to moderate mitral valve regurgitation, and normal RV function. Since patient was dehydrated, orthostatic, and had and an AKI, her home spironolactone  25mg  daily, metoprolol  XL 100 mg daily, and Lasix  20mg  once daily were held during hospitalization and at discharge.  #Diabetic neuropathy #T2DM Glucose remained stable between 154-173. Repeat HgbA1c 6.5% this admission. Her home metformin  was held during hospitalization and at discharge since GFR low. She was continued on her home gabapentin  200 mg nightly and duloxetine  30 mg nightly while hospitalized.   #COPD #OSA Patient maintained normal saturations (SpO2 98-100%) on her home 2L [place] during the day and CPAP at night. There were no concerns for an exacerbation since patient denied cough or increased work of breathing. She received Breo-Ellipta as replacement for home Symbicort .    #Mobitz type II second degree AV block s/p pacemaker placement #Permanent A-fib Patient denied any recent falls, syncope, or head trauma associated with the orthostatic hypotension, so there were no concerns for  intracranial hemorrhage despite anticoagulation. EKG on admission showed possible atrial flutter and ventricular paced rhythm, but this was unchanged from 02/2024. She was monitored on telemetry with no concerns. Her home diltiazem  and metoprolol  were held during hospitalization given orthostasis, but she remained rate controlled with HR<100. Since creatinine >1.5 and patient is >40 years old, also decreased Eliquis  dose to 2.5 mg BID for now.    #CAD #HLD Most recent lipid panel on chart review in 03/2018 with LDL 51, triglycerides 194, HDL 35, and total 125. Continued her home atorvastatin  40 mg daily and fenofibrate  160 mg daily while hospitalized. Recommend repeat lipid panel outpatient.   #Insomnia Patient is prescribed lorazepam  as needed at home for insomnia. She reported taking the lorazepam  almost nightly for the week prior to admission to help her sleep. This medication was held while hospitalized since she is already at an increased risk for hospital delirium given her age but can be resumed at discharge.

## 2024-05-09 NOTE — ED Provider Notes (Signed)
 [place] EMERGENCY DEPARTMENT AT [facility] Provider Note   CSN: [id number] Arrival date & time: 05/09/24  1040     Patient presents with: Hypotension   Brandi Hunter is a 83 y.o. female.   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  The patient states that she had a home health nurse coming this morning and that her alarm did not go off.  She states that her son called her around 21 to wake her up when her home health nurse was supposed to be there 830.  She states that she had to rush to go to the bathroom and get dressed before the nurse got there and when the nurse got to her they took her vitals and her blood pressure was low in the 60s systolic.  She states that she did feel little lightheaded and dizzy which she states is common for her if she gets up too quick which she did this morning.  She denied any associated chest pain or shortness of breath.  She states that she has had a decreased appetite and has not been eating as well recently as she states that she has not felt like cooking.  She denies any vomiting or diarrhea.  She does report that her primary doctor recently told her to decrease one of her medications but she did not remember which 1 she was supposed to decrease and did decrease her midodrine .  She states that she has not taken it yet this morning.  The history is provided by the patient.       Prior to Admission medications   Medication Sig Start Date End Date Taking? Authorizing Provider  acetaminophen  (TYLENOL ) 650 MG CR tablet Take 650 mg by mouth every 8 (eight) hours as needed for pain.    [provider]  albuterol  (VENTOLIN  HFA) 108 (90 Base) MCG/ACT inhaler Inhale 2 puffs into the lungs every 4 (four) hours as needed for wheezing.  07/21/12    [provider]  apixaban  (ELIQUIS ) 5 MG TABS tablet Take 1 tablet (5 mg total) by mouth 2 (two) times daily. 03/02/24   Debera Jayson MATSU, MD  atorvastatin  (LIPITOR) 40 MG tablet Take 40 mg by mouth at bedtime.  03/17/13   [provider]  budesonide -formoterol  (SYMBICORT ) 160-4.5 MCG/ACT inhaler Inhale 2 puffs into the lungs 2 (two) times daily.    [provider]  Cholecalciferol (VITAMIN D3) 1.25 MG (50000 UT) CAPS Take 1 capsule by mouth once a week. 05/17/23   [provider]  Cyanocobalamin (B-12 PO) Take by mouth.    [provider]  diclofenac  Sodium (VOLTAREN ) 1 % GEL Apply 2 g topically 2 (two) times daily as needed (for pain).  05/03/20   [provider]  diltiazem  (CARDIZEM  CD) 120 MG 24 hr capsule TAKE 1 CAPSULE EVERY DAY 11/19/23   Debera Jayson MATSU, MD  DULoxetine  (CYMBALTA ) 30 MG capsule Take 30 mg by mouth at bedtime.  02/22/20   [provider]  famotidine  (PEPCID ) 20 MG tablet Take 20 mg by mouth daily as needed for heartburn or indigestion. 09/06/19   [provider]  fenofibrate  160 MG tablet Take 160 mg by mouth every evening.    [provider]  furosemide  (LASIX ) 20 MG tablet TAKE 1 TABLET EVERY DAY 11/19/23   Debera Jayson  G, MD  gabapentin  (NEURONTIN ) 100 MG capsule Take 200 mg by mouth at bedtime. 12/18/20   [provider]  ipratropium-albuterol  (DUONEB) 0.5-2.5 (3) MG/3ML SOLN Take 3 mLs by nebulization 4 (four) times daily. Patient taking differently: Take 3 mLs by nebulization every 6 (six) hours as needed (Shortness of breath / Asthma). 08/31/19   Johnson, Clanford L, MD  LORazepam  (ATIVAN ) 0.5 MG tablet Take 0.5-1 mg by mouth See admin instructions. Take half to one tablet (0.25 mg - 0.5 mg) by mouth twice daily as needed for anxiety 09/18/22   [provider]  meclizine (ANTIVERT) 25 MG tablet Take 25 mg by mouth 3 (three) times daily as needed for dizziness.    [provider]  metFORMIN  (GLUCOPHAGE ) 500 MG tablet Take 500 mg by mouth daily. 03/05/20   [provider]  metoprolol  succinate (TOPROL -XL) 100 MG 24 hr tablet TAKE 1 TABLET (100 MG TOTAL) BY MOUTH DAILY. TAKE WITH OR IMMEDIATELY FOLLOWING A MEAL. 11/19/23   Debera Jayson MATSU, MD  midodrine  (PROAMATINE ) 2.5 MG tablet TAKE 1 TABLET TWICE DAILY WITH MEALS (DOSE DECREASE) 11/19/23   Debera Jayson MATSU, MD  nitroGLYCERIN  (NITROSTAT ) 0.4 MG SL tablet USE 1 TAB UNDER TONGUE EVERY 5 MINUTES AS NEEDED FOR CHEST PAIN FOR 3 DOSES. IF NO RELIEF AFTER 3RD TAB GO TO ER OR CALL 911 08/31/23   Debera Jayson MATSU, MD  OXYGEN  Inhale 2 L into the lungs continuous.    [provider]  spironolactone  (ALDACTONE ) 25 MG tablet TAKE 1/2 TABLET ONE TIME DAILY 11/19/23   Debera Jayson MATSU, MD    Allergies: Bee venom and Latex    Review of Systems  Updated Vital Signs BP 126/70   Pulse 70   Temp 99.1 F (37.3 C) (Oral)   Resp 14   Ht 5' 6 (1.676 m)   Wt 84.8 kg   SpO2 100%   BMI 30.17 kg/m   Physical Exam Vitals and nursing note reviewed.  Constitutional:      General: She is not in acute distress.    Appearance: Normal appearance. She is obese.  HENT:     Head: Normocephalic and atraumatic.     Nose: Nose normal.     Mouth/Throat:     Mouth: Mucous membranes are moist.     Pharynx: Oropharynx is clear.   Eyes:     Extraocular Movements: Extraocular movements intact.     Conjunctiva/sclera: Conjunctivae normal.    Cardiovascular:     Rate and Rhythm: Normal rate and regular rhythm.     Heart sounds: Normal heart sounds.  Pulmonary:     Effort: Pulmonary effort is normal.     Breath sounds: Normal breath sounds.  Abdominal:     General: Abdomen is flat.     Palpations: Abdomen is soft.     Tenderness: There is no abdominal tenderness.   Musculoskeletal:        General: Normal range of motion.     Cervical back: Normal range of motion.     Right lower leg: No edema.      Left lower leg: No edema.   Skin:    General: Skin is warm and dry.   Neurological:     General: No focal deficit present.     Mental Status: She is alert and oriented to person, place, and time.     Sensory: No sensory deficit.     Motor: No weakness.   Psychiatric:  Mood and Affect: Mood normal.        Behavior: Behavior normal.     (all labs ordered are listed, but only abnormal results are displayed) Labs Reviewed  BASIC METABOLIC PANEL WITH GFR - Abnormal; Notable for the following components:      Result Value   Glucose, Bld 154 (*)    BUN 50 (*)    Creatinine, Ser 2.75 (*)    GFR, Estimated 17 (*)    All other components within normal limits  CBC WITH DIFFERENTIAL/PLATELET - Abnormal; Notable for the following components:   Hemoglobin 11.6 (*)    All other components within normal limits    EKG: EKG Interpretation Date/Time:  Tuesday May 09 2024 10:58:16 EDT Ventricular Rate:  70 PR Interval:  66 QRS Duration:  148 QT Interval:  455 QTC Calculation: 491 R Axis:   -89  Text Interpretation: VENTRICULAR PACED RHYTHM Atrial flutter Probable left atrial enlargement Nonspecific IVCD with LAD Left ventricular hypertrophy Anterior infarct, old Baseline wander No significant change since last tracing Confirmed by Ellouise Fine ([id number]) on 05/09/2024 11:03:48 AM  Radiology: No results found.   Procedures   Medications Ordered in the ED  midodrine  (PROAMATINE ) tablet 2.5 mg (2.5 mg Oral Given 05/09/24 1147)  lactated ringers  bolus 1,000 mL (1,000 mLs Intravenous New Bag/Given 05/09/24 1251)    Clinical Course as of 05/09/24 1334  Tue May 09, 2024  1240 Cr 2.75 from 2.0 in May 2025 (up from 1.3 in April 2025), hgb at baseline. Will be given fluid bolus. [VK]    Clinical Course User Index [VK] Kingsley, Patsey Pitstick K, DO                                 Medical Decision Making This patient presents to the ED with chief complaint(s) of hypotension with  pertinent past medical history of orthostatic hypotension, a fib which further complicates the presenting complaint. The complaint involves an extensive differential diagnosis and also carries with it a high risk of complications and morbidity.    The differential diagnosis includes orthostatic hypotension, medication error, dehydration, electrolyte abnormality, anemia, no signs of infection on exam  Additional history obtained: Additional history obtained from EMS Records reviewed Primary Care Documents and outpatient cardiology records  ED Course and Reassessment: On patient's arrival she is hemodynamically stable in no acute distress.  EKG on arrival showed ventricular paced rhythm with underlying atrial flutter, no acute ischemic changes.  Patient will have labs to evaluate for dehydration, anemia or other etiology of her hypotension will be given her home midodrine .  She will be closely reassessed.  Independent labs interpretation:  The following labs were independently interpreted: AKI on CKD, otherwise at baseline  Independent visualization of imaging: - N/A  Consultation: - Consulted or discussed management/test interpretation w/ external professional: hospitalist  Consideration for admission or further workup: patient requires admission for AKI with hypotensi Social Determinants of health: N/A    Amount and/or Complexity of Data Reviewed Labs: ordered.  Risk Prescription drug management. Decision regarding hospitalization.       Final diagnoses:  AKI (acute kidney injury) (HCC)  Transient hypotension    ED Discharge Orders     None          Kingsley, Siearra Amberg K, DO 05/09/24 1334

## 2024-05-09 NOTE — Progress Notes (Signed)
 Remote pacemaker transmission.

## 2024-05-09 NOTE — H&P (Cosign Needed Addendum)
 Date: 05/09/2024               Patient Name:  [name] MRN: [id number]  DOB: 1941/05/30 Age / Sex: 83 y.o., female   PCP: Medicine, [facility] (Inactive)              Medical Service: Internal Medicine Teaching Service              Attending Physician: [name], MD    First Contact: Brandi Hunter, MS4    Second Contact: Dr. Damien Hunter    Third Contact Dr. 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  Patient was seen by a nurse practitioner from her insurance company for a home safety evaluation this morning. The visit was scheduled for 8:30AM, but the patient woke up later than expected at 8:15AM. She was rushing to get ready, so she had only drank a few sips of water  before the NP arrived. She did not have time to eat breakfast. When the NP took the patient's blood pressure, her systolic blood pressure was in the 60-70s, prompting her to call EMS.  Patient has been struggling with lightheadedness for the past 3-4 weeks after her medications were adjusted by cardiology. She thought they told her to stop taking metoprolol , but she stopped taking her midodrine  instead. This medication change has caused lightheadedness that is limiting her daily activities. For example, she previously was able to cook meals by herself, but she has been unable to cook for the past few weeks because she is too lightheaded to stand for more than a few seconds. Since she has been unable to cook, she has been unable to eat a normal amount of food. Therefore, her symptoms have progressively  worsened such that they have occurred every day for the past week.  Patient otherwise reports chronic rhinorrhea, occasional chest pain that resolves with nitroglycerin , occasional nausea that is not associated with meals or vomiting, lower extremity edema that improves when she wears compression socks, difficulty swallowing when eating breads or meats, and occasional heart burn. She denies falls, syncope, headaches, vision changes, sore throat, palpitations, abdominal pain, constipation, diarrhea, dysuria, hematuria, hematochezia, or weakness.  Upon EMS arrival, patient was normotensive when sitting (110/70) but hypotensive (70/40) when standing. She received 120 mL of NS en route to the hospital. Upon arrival to the ED, patient was afebrile (T 99.39F) and normotensive (126/70) with normal heart rate. Workup was notable for EKG with possible atrial flutter and ventricular paced rhythm (unchanged from 02/2024), Hgb 11.6, Cr 2.75 (baseline 1.3-1.6), and glucose 154. She received midodrine  2.5 mg x1 and 1L NS bolus x1.  Meds:  Albuterol  inhaler as needed Atorvastatin  40mg  daily Cardizdem 120mg  daily Fenofibrate  160mg  daily Lasix  20mg  once daily Gabapentin  200mg  night  Lorazepen 0.5mg  PRN (generally 1-2 times per week) Meclizine 25mg  PRN (generally 1-2 times per week) Metformin  500 mg BID Metoprolol  XL 100 mg daily Midodrine  2.5mg  BID  Spironolactone  25mg  daily Symbicort  160-4.5 2 puffs BID  Eliquis  5mg  BID  Allergies: Allergies  as of 05/09/2024 - Review Complete 05/09/2024  Allergen Reaction Noted   Bee venom Swelling 03/29/2013   Latex Swelling 05/25/2011   Past Medical History:  Diagnosis Date   Asthma    CAD (coronary artery disease)    a. catheterization in 03/2018 showing 90% ostial OM stenosis with medical management recommended at that time   Chronic anticoagulation    COPD (chronic obstructive pulmonary disease) (HCC)    DJD (degenerative joint disease)    Essential  hypertension    GERD (gastroesophageal reflux disease)    History of home oxygen  therapy    Hyperlipidemia    Mobitz (type) II atrioventricular block    Obesity    Pacemaker    Implanted by Dr Brandi Hunter (MDT) 10/06/10   PAF (paroxysmal atrial fibrillation) (HCC)    Pancreatitis 2010 OR 2011   Sleep apnea    CPAP   Type 2 diabetes mellitus (HCC)     Family History:  - Mother: CHF - Father: CHF, CVA, prediabetes - Brother: prostate cancer, T2DM - Sister: T2DM, A-fib w/pacemaker - Son: LVAD, T2DM - Son: HTN - Son: lives in [place], unsure of medical conditions  Social History:  Patient lives in [place]. Her oldest son lives upstairs. She is independent with ADLs/iADLs. Her son used to help her with cooking but has been unable to help since LVAD placement. She uses a rolling walker or cane with ambulation. She denies current tobacco, alcohol, or recreational drug use. She previously smoked 0.5 ppd for 30-40 years. Emergency contact is Corinthia Riddles (son: [phone])  Review of Systems: A complete ROS was negative except as per HPI.  Physical Exam: Blood pressure (!) 152/72, pulse 70, temperature 99.1 F (37.3 C), temperature source Oral, resp. rate (!) 22, height 5' 6 (1.676 m), weight 84.8 kg, SpO2 100%. General: Older-appearing female lying comfortably in hospital bed, pleasant throughout interview, in NAD. HENT: Normocephalic and atraumatic. Nasal cannula in place. Oropharynx without lesions, erythema, or exudate. Mucous membranes dry. Eyes: Conjunctivae clear. PERRL.  Cardiovascular: Normal rate with regular rhythm. No murmurs, rubs, or gallops. Normal radial and PT pulses bilaterally. No LE edema.  Pulmonary: Lungs CTAB. No wheezes, rales, or rhonchi.  Normal respiratory effort on 2L [place]. Abdominal: Soft. Non-distended. No tenderness to palpation. Normal bowel sounds.    Neurological: Alert, responds appropriately to questions. Skin: Warm and dry. Cap refill 2-3  seconds. No skin tenting. Abrasions on shins.  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  #Orthostatic hypotension Patient presents with orthostatic hypotension (110/70 standing, 70/40 sitting) in the setting of 3-4 weeks of lightheadedness. The lightheadedness started after patient mistakenly discontinued her midodrine  instead of metoprolol  following an outpatient cardiology appointment. The lightheadedness has limited her ability to cook since she is unable to stand for more than a few seconds, so patient has also had decreased PO intake for the past 3-4 weeks. Therefore, suspect etiology of orthostatic hypotension is multifactorial with dehydration and midodrine  discontinuation both contributing. She appears volume down on exam with dry mucous membranes and slightly prolonged cap refill. She was in the process of receiving 1L NS bolus, so will re-evaluate volume status after IV fluids. If  she is still dehydrated, will give additional fluid bolus. Blood pressure while sitting has increased to 153/74 after midodrine  2.5 mg x1, so will hold off on additional midodrine  for now. Will repeat orthostatic vitals tomorrow after fluid resuscitation complete. Will also hold home medications and gradually restart them as heart rate and blood pressure tolerate. - s/p 1.25L NS bolus + midodrine  2.5 mg - Consider additional IV fluids - Orthostatic vital signs - PT/OT evaluation - Telemetry - Hold midodrine  2.5mg  BID  - Hold home spironolactone  25mg  daily - Hold home diltiazem  120mg  daily - Hold home metoprolol  XL 100 mg daily - Hold home Lasix  20mg  once daily  #AKI #CKD stage  3b Admission labs with Cr 2.75 (GFR 17), which is elevated from her baseline of 1.3-1.6. Suspect AKI is secondary to decreased PO intake over the past several weeks as patient appears dehydrated on exam. She is already receiving 1.25L of IV fluids. Will reassess volume status this evening and possibly give additional fluid bolus. - s/p 1.25L NS bolus - Consider additional IV fluids based on volume status in the AM  - Repeat BMP and Mg tomorrow AM  #Dysphagia #GERD Patient reports globus sensation and food regurgitation when eating foods like breads and meats, which is concerning for esophageal stricture. Patient has a chronic history of GERD, dating back to at least 2012 on chart review. Most recent EGD in 07/2014 for GERD and nausea showed medium sized hiatal hernial. Patient currently only takes pantoprazole  as needed.  Plan: - SLP consulted  #HFpEF LVEF 60-65% 04/2020 #HTN Patient currently appears volume down on exam. Most recent echocardiogram in 04/2020 with EF 60-65%, no regional wall motion abnormalities, mild LVH with grade II diastolic dysfunction, severely left and right atrial dilation, mild to moderate mitral valve regurgitation, and normal RV function. Will hold home medications for now.  Plan: - Hold home spironolactone  25mg  daily - Hold home metoprolol  XL 100 mg daily - Hold home Lasix  20mg  once daily - May consider Jardiance but it may result in additional diuresis  #Diabetic neuropathy #T2DM Glucose currently stable at 154. Most recent HgbA1c 6.0 in 09/2022 per chart review. Will check glucose every morning. Will hold home metformin  for now since glucose currently at goal. Recommend repeat HgbA1c outpatient. - CBG every morning - Hold home metformin  500 mg BID - Home gabapentin  200mg  nightly - Home duloxetine  30 mg nightly - HgbA1c ordered  #COPD #OSA Patient is saturating well (SpO2 100%) on home 2L [place]. No concerns for exacerbation since patient denies cough or  increased work of breathing. Will continue on home supplemental oxygen  during the day and CPAP at night. Ordered Breo-Ellipta as replacement for home Symbicort . 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 EKG with possible atrial flutter and ventricular paced rhythm, but this is unchanged from 02/2024. Will monitor on telemetry. Will hold home diltiazem  and metoprolol  given orthostasis. Since creatinine >1.5 and patient is >100 years old, will also decrease Eliquis  dose to 2.5 mg BID for now. - Decrease Eliquis  to 2.5mg  BID - Hold home diltiazem  120mg  daily - Hold home metoprolol  XL 100 mg daily - Telemetry  #CAD #HLD Most recent lipid panel on chart review in  03/2018 with LDL 51, triglycerides 194, HDL 35, and total 125. Will continue home medications while hospitalized, but recommend repeat lipid panel outpatient. - Home atorvastatin  40mg  daily - Home fenofibrate  160mg  daily - Outpatient lipid panel  #Insomnia Patient is prescribed lorazepam  as needed at home for insomnia. She reports taking the lorazepam  almost nightly for the past week to help her sleep. Will hold while hospitalized since she is already at an increased risk for hospital delirium given her age. - Hold home lorazepam  0.5mg  PRN  Dispo: Admit patient to Observation with expected length of stay less than 2 midnights.  Signed: Koleen Brandi BRAVO, Medical Hunter 05/09/2024, 4:00 PM   Attestation for Hunter Documentation:  I personally was present and performed or re-performed the history, physical exam and medical decision-making activities of this service and have verified that the service and findings are accurately  documented in the Hunter's note.  Kandis Perkins, DO 05/09/2024, 6:47 PM

## 2024-05-09 NOTE — Progress Notes (Signed)
 Attempted to place patient on teley but we have no leads/wires for the box Charge RN notified and notified teley. Currently trying to locate wires/leads to go with teley box [id number] that was placed at bedside and will be placed on monitor once leads obtained.   Pt did question need for teley as she does have permanent Afib and Pacemaker in place.   Will notify MD of concerns.

## 2024-05-09 NOTE — Progress Notes (Signed)
 Pt did not want to wear CPAP at this time.

## 2024-05-09 NOTE — Evaluation (Signed)
 Clinical/Bedside Swallow Evaluation Patient Details  Name: Brandi Hunter MRN: [id number] Date of Birth: Feb 08, 1941  Today's Date: 05/09/2024 Time: SLP Start Time (ACUTE ONLY): 1516 SLP Stop Time (ACUTE ONLY): 1530 SLP Time Calculation (min) (ACUTE ONLY): 14 min  Past Medical History:  Past Medical History:  Diagnosis Date   Asthma    CAD (coronary artery disease)    a. catheterization in 03/2018 showing 90% ostial OM stenosis with medical management recommended at that time   Chronic anticoagulation    COPD (chronic obstructive pulmonary disease) (HCC)    DJD (degenerative joint disease)    Essential hypertension    GERD (gastroesophageal reflux disease)    History of home oxygen  therapy    Hyperlipidemia    Mobitz (type) II atrioventricular block    Obesity    Pacemaker    Implanted by Dr [name] (MDT) 10/06/10   PAF (paroxysmal atrial fibrillation) (HCC)    Pancreatitis 2010 OR 2011   Sleep apnea    CPAP   Type 2 diabetes mellitus (HCC)    Past Surgical History:  Past Surgical History:  Procedure Laterality Date   CARDIOVERSION N/A 08/18/2019   Procedure: CARDIOVERSION;  Surgeon: Pietro Redell RAMAN, MD;  Location: [facility] ENDOSCOPY;  Service: Cardiovascular;  Laterality: N/A;   CATARACT EXTRACTION W/PHACO  11/02/2011   Procedure: CATARACT EXTRACTION PHACO AND INTRAOCULAR LENS PLACEMENT (IOC);  Surgeon: Dow JULIANNA Burke;  Location: [facility] ORS;  Service: Ophthalmology;  Laterality: Right;  CDE=7.33   CATARACT EXTRACTION W/PHACO  12/07/2011   Procedure: CATARACT EXTRACTION PHACO AND INTRAOCULAR LENS PLACEMENT (IOC);  Surgeon: Dow JULIANNA Burke, MD;  Location: [facility] ORS;  Service: Ophthalmology;  Laterality: Left;  CDE 3.61   COLONOSCOPY WITH PROPOFOL  N/A 08/09/2014   Procedure: COLONOSCOPY WITH PROPOFOL ;  Surgeon: Renaye Sous, MD;  Location: [facility] ENDOSCOPY;  Service: Endoscopy;  Laterality: N/A;   CYSTOCELE REPAIR     ESOPHAGOGASTRODUODENOSCOPY (EGD) WITH PROPOFOL  N/A 08/09/2014   Procedure:  ESOPHAGOGASTRODUODENOSCOPY (EGD) WITH PROPOFOL ;  Surgeon: Renaye Sous, MD;  Location: [facility] ENDOSCOPY;  Service: Endoscopy;  Laterality: N/A;   EYE SURGERY  11/01/2012   BOTH EYES CATARACTS   INSERT / REPLACE / REMOVE PACEMAKER  10/06/10   MDT  implanted by Dr [name]   KNEE ARTHROSCOPY     both   LEFT HEART CATH AND CORONARY ANGIOGRAPHY N/A 03/24/2018   Procedure: LEFT HEART CATH AND CORONARY ANGIOGRAPHY;  Surgeon: Court Dorn PARAS, MD;  Location: [facility] INVASIVE CV LAB;  Service: Cardiovascular;  Laterality: N/A;   OVARY SURGERY     removal   PPM GENERATOR CHANGEOUT N/A 03/15/2023   Procedure: PPM GENERATOR CHANGEOUT;  Surgeon: Nancey, Eulas BRAVO, MD;  Location: [facility] INVASIVE CV LAB;  Service: Cardiovascular;  Laterality: N/A;   REPAIR RECTOCELE     SIMPLE MASTECTOMY WITH AXILLARY SENTINEL NODE BIOPSY Left 01/09/2015   Procedure: Irrigation and Drainage Abcess left Axilla;  Surgeon: Krystal Russell, MD;  Location: [facility] ORS;  Service: General;  Laterality: Left;   TONSILLECTOMY  AGE 36   TOTAL ABDOMINAL HYSTERECTOMY  1971   HPI:  Brandi Hunter is an 83 yo female presenting to ED 6/24 with hypotension. Pt reports persistent lightheadedness after an accidental medication change. Esophagram 2009 was [facility] Pt also reports a recent visit to an ENT but the report is unavailable. PMH includes HTN, HFpEF, permanent A-fib, T2DM, Mobitz type 2 second degree AV block s/p pacemaker placement, COPD on 2L [place], OSA, CKD 3B, GERD, CAD, HLD    Assessment /  Plan / Recommendation  Clinical Impression  Pt reports globus sensation with subsequent regurgitation 1-2x/week, specifically with meat and bread. She also endorses changes to her vocal quality and SLP appreciated a hoarse quality in spontaneous speech. She fed herself trials of thin liquids and regular solids without overt s/s of dysphagia or aspiration. The symptoms that she describes sound most consistent with an esophageal component (could consider further w/u if  indicated). Recommend resuming regular diet with thin liquids. Education was provided regarding esophageal precautions. Discussed with MD and will sign off. SLP Visit Diagnosis: Dysphagia, unspecified (R13.10)    Aspiration Risk  Mild aspiration risk    Diet Recommendation Regular;Thin liquid    Liquid Administration via: Cup;Straw Medication Administration: Whole meds with liquid Supervision: Patient able to self feed Compensations: Slow rate;Small sips/bites Postural Changes: Seated upright at 90 degrees;Remain upright for at least 30 minutes after po intake    Other  Recommendations Recommended Consults: Consider esophageal assessment Oral Care Recommendations: Oral care BID     Assistance Recommended at Discharge    Functional Status Assessment Patient has not had a recent decline in their functional status  Frequency and Duration            Prognosis Prognosis for improved oropharyngeal function: Good      Swallow Study   General HPI: Brandi Hunter is an 83 yo female presenting to ED 6/24 with hypotension. Pt reports persistent lightheadedness after an accidental medication change. Esophagram 2009 was [facility]. Pt also reports a recent visit to an ENT but the report is unavailable. PMH includes HTN, HFpEF, permanent A-fib, T2DM, Mobitz type 2 second degree AV block s/p pacemaker placement, COPD on 2L [place], OSA, CKD 3B, GERD, CAD, HLD Type of Study: Bedside Swallow Evaluation Previous Swallow Assessment: none in chart Diet Prior to this Study: Dysphagia 3 (mechanical soft);Thin liquids (Level 0) Temperature Spikes Noted: No Respiratory Status: Nasal cannula History of Recent Intubation: No Behavior/Cognition: Alert;Cooperative;Pleasant mood Oral Cavity Assessment: Within Functional Limits Oral Care Completed by SLP: No Oral Cavity - Dentition: Dentures, top;Dentures, bottom Vision: Functional for self-feeding Self-Feeding Abilities: Able to feed self Patient Positioning: Upright  in bed Baseline Vocal Quality: Hoarse Volitional Cough: Strong Volitional Swallow: Able to elicit    Oral/Motor/Sensory Function Overall Oral Motor/Sensory Function: Within functional limits   Ice Chips Ice chips: Not tested   Thin Liquid Thin Liquid: Within functional limits Presentation: Straw;Self Fed    Nectar Thick Nectar Thick Liquid: Not tested   Honey Thick Honey Thick Liquid: Not tested   Puree Puree: Not tested   Solid     Solid: Within functional limits Presentation: Self Fed      Damien Blumenthal, M.A., CCC-SLP Speech Language Pathology, Acute Rehabilitation Services  Secure Chat preferred [phone]  05/09/2024,3:57 PM

## 2024-05-10 ENCOUNTER — Other Ambulatory Visit (HOSPITAL_COMMUNITY): Payer: Self-pay

## 2024-05-10 DIAGNOSIS — I951 Orthostatic hypotension: Principal | ICD-10-CM

## 2024-05-10 DIAGNOSIS — N179 Acute kidney failure, unspecified: Secondary | ICD-10-CM

## 2024-05-10 LAB — BASIC METABOLIC PANEL WITH GFR
Anion gap: 6 (ref 5–15)
Anion gap: 9 (ref 5–15)
BUN: 43 mg/dL — ABNORMAL HIGH (ref 8–23)
BUN: 39 mg/dL — ABNORMAL HIGH (ref 8–23)
CO2: 31 mmol/L (ref 22–32)
CO2: 29 mmol/L (ref 22–32)
Calcium: 9.1 mg/dL (ref 8.9–10.3)
Calcium: 8.9 mg/dL (ref 8.9–10.3)
Chloride: 100 mmol/L (ref 98–111)
Chloride: 99 mmol/L (ref 98–111)
Creatinine, Ser: 2.12 mg/dL — ABNORMAL HIGH (ref 0.44–1.00)
Creatinine, Ser: 1.78 mg/dL — ABNORMAL HIGH (ref 0.44–1.00)
GFR, Estimated: 23 mL/min — ABNORMAL LOW (ref >60)
GFR, Estimated: 28 mL/min — ABNORMAL LOW (ref >60)
Glucose, Bld: 173 mg/dL — ABNORMAL HIGH (ref 70–99)
Glucose, Bld: 213 mg/dL — ABNORMAL HIGH (ref 70–99)
Potassium: 5.2 mmol/L — ABNORMAL HIGH (ref 3.5–5.1)
Potassium: 5.1 mmol/L (ref 3.5–5.1)
Sodium: 137 mmol/L (ref 135–145)
Sodium: 137 mmol/L (ref 135–145)

## 2024-05-10 LAB — MAGNESIUM: Magnesium: 1.8 mg/dL (ref 1.7–2.4)

## 2024-05-10 LAB — GLUCOSE, CAPILLARY: Glucose-Capillary: 221 mg/dL — ABNORMAL HIGH (ref 70–99)

## 2024-05-10 MED ORDER — MIDODRINE HCL 5 MG PO TABS
2.5000 mg | ORAL_TABLET | Freq: Once | ORAL | Status: AC
  Administered 2024-05-10: 2.5 mg via ORAL
  Filled 2024-05-10: qty 1

## 2024-05-10 MED ORDER — LACTATED RINGERS IV BOLUS
1000.0000 mL | Freq: Once | INTRAVENOUS | Status: AC
  Administered 2024-05-10: 1000 mL via INTRAVENOUS

## 2024-05-10 MED ORDER — APIXABAN 2.5 MG PO TABS
2.5000 mg | ORAL_TABLET | Freq: Two times a day (BID) | ORAL | 0 refills | Status: DC
  Filled 2024-05-10: qty 60, 30d supply, fill #0

## 2024-05-10 MED ORDER — MIDODRINE HCL 5 MG PO TABS
2.5000 mg | ORAL_TABLET | Freq: Two times a day (BID) | ORAL | Status: DC
  Administered 2024-05-10 – 2024-05-11 (×2): 2.5 mg via ORAL
  Filled 2024-05-10 (×2): qty 1

## 2024-05-10 MED ORDER — PANTOPRAZOLE SODIUM 40 MG PO TBEC
40.0000 mg | DELAYED_RELEASE_TABLET | Freq: Every day | ORAL | 0 refills | Status: DC
  Filled 2024-05-10: qty 30, 30d supply, fill #0

## 2024-05-10 MED ORDER — PANTOPRAZOLE SODIUM 40 MG PO TBEC
40.0000 mg | DELAYED_RELEASE_TABLET | Freq: Every day | ORAL | Status: DC
  Administered 2024-05-10 – 2024-05-11 (×2): 40 mg via ORAL
  Filled 2024-05-10 (×2): qty 1

## 2024-05-10 MED ORDER — SODIUM ZIRCONIUM CYCLOSILICATE 10 G PO PACK
10.0000 g | PACK | Freq: Once | ORAL | Status: AC
  Administered 2024-05-10: 10 g via ORAL
  Filled 2024-05-10: qty 1

## 2024-05-10 NOTE — Evaluation (Signed)
 Physical Therapy Evaluation Patient Details Name: Brandi Hunter MRN: [id number] DOB: 10-26-1941 Today's Date: 05/10/2024  History of Present Illness  Pt is a 83 yr old female who presented 05/09/24 due to low BP.  PMH of HTN, HFpEF, permanent A-fib, T2DM, Mobitz type II second degree AV block s/p pacemaker placement, COPD on 2L [place], OSA, CKD stage 3b, GERD, CAD, and HLD  Clinical Impression  Patient presents with decreased mobility due to generalized weakness and decreased activity tolerance with BP drop in standing.  Previously she has been dealing with this issue at home with compensatory techniques with TEDs and keeping chairs close, etc.  She was able to mobilize today with close S to CGA for mobility including stairs which she has to negotiate for home entry.  Normally has her son to help her, though he has been ill and hospitalized though her other son checking on her intermittently.  She will benefit from skilled PT in the acute setting and from follow up HHPT for safety at d/c.  Orthostatic VS for the past 24 hrs (Last 3 readings):  BP- Lying Pulse- Lying BP- Sitting Pulse- Sitting BP- Standing at 0 minutes Pulse- Standing at 0 minutes  05/10/24 1500 132/67 69 131/66 70 92/45 70         If plan is discharge home, recommend the following: Help with stairs or ramp for entrance;Assist for transportation;Assistance with cooking/housework   Can travel by private vehicle        Equipment Recommendations None recommended by PT  Recommendations for Other Services       Functional Status Assessment Patient has had a recent decline in their functional status and demonstrates the ability to make significant improvements in function in a reasonable and predictable amount of time.     Precautions / Restrictions Precautions Precautions: Fall Recall of Precautions/Restrictions: Intact Precaution/Restrictions Comments: watch BP      Mobility  Bed Mobility Overal bed mobility: Modified  Independent                  Transfers Overall transfer level: Needs assistance Equipment used: Rolling walker (2 wheels) Transfers: Sit to/from Stand, Bed to chair/wheelchair/BSC Sit to Stand: Supervision   Step pivot transfers: Supervision       General transfer comment: assist during initial BP check for safety; to wheelchair from EOB with S simulated car transfer to transport wheelchair and used to get to stairwell    Ambulation/Gait Ambulation/Gait assistance: Supervision Gait Distance (Feet): 12 Feet (x 2 to bathroom then back to EOB) Assistive device: Rolling walker (2 wheels) Gait Pattern/deviations: Step-to pattern, Decreased stride length, Shuffle       General Gait Details: using walker to bathroom then did not take O2 in bathroom though completed toilet hygiene and washing hands at sink without significant SOB  Stairs Stairs: Yes Stairs assistance: Supervision, Contact guard assist Stair Management: One rail Right, One rail Left, Sideways, Step to pattern Number of Stairs: 4 General stair comments: using R rail to ascend and L to descend A for safety; on 2L O2 with dyspnea though unable to read SpO2 with portable monitor initially  Wheelchair Mobility     Tilt Bed    Modified Rankin (Stroke Patients Only)       Balance Overall balance assessment: Needs assistance   Sitting balance-Leahy Scale: Good Sitting balance - Comments: seated for toilet hygiene   Standing balance support: Bilateral upper extremity supported, During functional activity Standing balance-Leahy Scale: Fair Standing balance comment:  washing hands at sink                             Pertinent Vitals/Pain Pain Assessment Pain Assessment: Faces Faces Pain Scale: Hurts a little bit Pain Location: back/neck Pain Descriptors / Indicators: Aching Pain Intervention(s): Monitored during session, Repositioned    Home Living Family/patient expects to be discharged  to:: Private residence Living Arrangements: Children (son who lives upstairs is now in [facility] and her other son works, but checks in frequently) Available Help at Discharge: Family Type of Home: House Home Access: Stairs to enter;Other (comment) Entrance Stairs-Rails: Right;Left Entrance Stairs-Number of Steps: 5   Home Layout: One level Home Equipment: Agricultural consultant (2 wheels);Rollator (4 wheels);Shower seat - built in;Grab bars - tub/shower;Wheelchair - manual Additional Comments: on 2L O2 at home    Prior Function Prior Level of Function : Independent/Modified Independent             Mobility Comments: uses walker at home; distances limited due to BP has chair at bottom of her entry steps to sit down after descending or before ascending. ADLs Comments: indep but then needed more assist due to changes in feeling to dizzy to complete     Extremity/Trunk Assessment   Upper Extremity Assessment Upper Extremity Assessment: Defer to OT evaluation    Lower Extremity Assessment Lower Extremity Assessment: Generalized weakness    Cervical / Trunk Assessment Cervical / Trunk Assessment: Kyphotic  Communication   Communication Communication: No apparent difficulties    Cognition Arousal: Alert Behavior During Therapy: WFL for tasks assessed/performed   PT - Cognitive impairments: No apparent impairments                         Following commands: Intact       Cueing Cueing Techniques: Verbal cues     General Comments General comments (skin integrity, edema, etc.): Discussed follow up and pt feels close to her baseline. Has used compensatory methods for some time due to BP issues ongoing.    Exercises     Assessment/Plan    PT Assessment Patient needs continued PT services  PT Problem List Decreased activity tolerance;Decreased mobility;Decreased balance;Cardiopulmonary status limiting activity       PT Treatment Interventions DME instruction;Gait  training;Stair training;Functional mobility training;Therapeutic activities;Therapeutic exercise;Balance training;Patient/family education    PT Goals (Current goals can be found in the Care Plan section)  Acute Rehab PT Goals Patient Stated Goal: return to independent PT Goal Formulation: With patient Time For Goal Achievement: 05/24/24 Potential to Achieve Goals: Good    Frequency Min 2X/week     Co-evaluation               AM-PAC PT 6 Clicks Mobility  Outcome Measure Help needed turning from your back to your side while in a flat bed without using bedrails?: None Help needed moving from lying on your back to sitting on the side of a flat bed without using bedrails?: None Help needed moving to and from a bed to a chair (including a wheelchair)?: A Little Help needed standing up from a chair using your arms (e.g., wheelchair or bedside chair)?: A Little Help needed to walk in hospital room?: A Little Help needed climbing 3-5 steps with a railing? : A Little 6 Click Score: 20    End of Session Equipment Utilized During Treatment: Gait belt;Oxygen  Activity Tolerance: Patient tolerated treatment well Patient left:  in bed;with call bell/phone within reach   PT Visit Diagnosis: Muscle weakness (generalized) (M62.81)    Time: 1020-1107 PT Time Calculation (min) (ACUTE ONLY): 47 min   Charges:   PT Evaluation $PT Eval Moderate Complexity: 1 Mod PT Treatments $Gait Training: 8-22 mins $Therapeutic Activity: 8-22 mins PT General Charges $$ ACUTE PT VISIT: 1 Visit         Micheline Portal, PT Acute Rehabilitation Services Office:[phone] 05/10/2024   Montie Portal 05/10/2024, 3:31 PM

## 2024-05-10 NOTE — Evaluation (Addendum)
 Occupational Therapy Evaluation Patient Details Name: Brandi Hunter MRN: [id number] DOB: 09/14/1941 Today's Date: 05/10/2024   History of Present Illness   Pt is a 83 yr old female who presented 05/09/24 due to low BP.  PMH of HTN, HFpEF, permanent A-fib, T2DM, Mobitz type II second degree AV block s/p pacemaker placement, COPD on 2L [place], OSA, CKD stage 3b, GERD, CAD, and HLD     Clinical Impressions Pt reports at PLOF they live with their son and uses 2L at all times, 4WW and was indep in all ADLS. However, pt has been having trouble completion of ADLs/IADLS due to dizziness and her older son who she lives with is currently in the hospital. She reported she ill have assist from her other son and was attempting to get a ramp for the 5 step entrance and grab bars by the commode. At this time she was limited due to BP as seen bellow and had one episode of LOB at the sink and required min assist to balance with FW use. Pt in a sitting position can complete ADLS with supervision to CGA. At this time recommendation for [facility] services and acute Occupational Therapy to follow.   BP:  Sitting:135/73 HR 70  Post toileting: 94/46 HR70 02 on 2L 84-95% Sitting: 154/75  BP attempted orthostatics: Sitting 128/92 Standing 79/65 became SOB but o2 98-100% with o2 on 2L  Sitting: 137/69  -[place] on throughout session    If plan is discharge home, recommend the following:   A little help with walking and/or transfers;A little help with bathing/dressing/bathroom;Assistance with cooking/housework;Assist for transportation;Help with stairs or ramp for entrance     Functional Status Assessment   Patient has had a recent decline in their functional status and demonstrates the ability to make significant improvements in function in a reasonable and predictable amount of time.     Equipment Recommendations   None recommended by OT (pt reported no DME needs)     Recommendations for Other Services          Precautions/Restrictions   Precautions Precautions: Fall Recall of Precautions/Restrictions: Intact Precaution/Restrictions Comments: BP     Mobility Bed Mobility               General bed mobility comments: presented sitting at EOB    Transfers Overall transfer level: Needs assistance Equipment used: Rolling walker (2 wheels) Transfers: Sit to/from Stand Sit to Stand: Contact guard assist                  Balance Overall balance assessment: Needs assistance Sitting-balance support: Feet supported Sitting balance-Leahy Scale: Good     Standing balance support: Bilateral upper extremity supported, Single extremity supported, No upper extremity supported Standing balance-Leahy Scale: Fair Standing balance comment: Pt was had one LOB and required min assist to stabilize self                           ADL either performed or assessed with clinical judgement   ADL Overall ADL's : Needs assistance/impaired Eating/Feeding: Independent;Sitting   Grooming: Set up;Sitting   Upper Body Bathing: Set up;Sitting   Lower Body Bathing: Contact guard assist;Sit to/from stand;Sitting/lateral leans   Upper Body Dressing : Set up;Sitting   Lower Body Dressing: Contact guard assist;Sit to/from stand   Toilet Transfer: Minimal assistance;Contact guard assist;Rolling walker (2 wheels)   Toileting- Clothing Manipulation and Hygiene: Contact guard assist;Sit to/from stand  Functional mobility during ADLs: Contact guard assist;Rolling walker (2 wheels)       Vision Baseline Vision/History: 1 Wears glasses Ability to See in Adequate Light: 0 Adequate Patient Visual Report: No change from baseline Vision Assessment?: Wears glasses for driving     Perception Perception: Within Functional Limits       Praxis Praxis: WFL       Pertinent Vitals/Pain Pain Assessment Pain Assessment: Faces Faces Pain Scale: Hurts little more Pain Location:  back/neck Pain Descriptors / Indicators: Aching Pain Intervention(s): Limited activity within patient's tolerance     Extremity/Trunk Assessment Upper Extremity Assessment Upper Extremity Assessment: Overall WFL for tasks assessed   Lower Extremity Assessment Lower Extremity Assessment: Defer to PT evaluation   Cervical / Trunk Assessment Cervical / Trunk Assessment: Kyphotic   Communication Communication Communication: No apparent difficulties   Cognition Arousal: Alert Behavior During Therapy: WFL for tasks assessed/performed Cognition: No apparent impairments                               Following commands: Intact       Cueing  General Comments   Cueing Techniques: Verbal cues      Exercises     Shoulder Instructions      Home Living Family/patient expects to be discharged to:: Private residence Living Arrangements: Children Available Help at Discharge: Family Type of Home: House Home Access: Stairs to enter;Other (comment) (attempting to get a ramp for the entrance of the home) Entrance Stairs-Number of Steps: 5 Entrance Stairs-Rails: Right;Left Home Layout: One level     Bathroom Shower/Tub: Producer, television/film/video: Standard     Home Equipment: Agricultural consultant (2 wheels);Rollator (4 wheels);Shower seat - built in;Grab bars - tub/shower;Wheelchair - manual   Additional Comments: 2L of o2 at home      Prior Functioning/Environment Prior Level of Function : Independent/Modified Independent             Mobility Comments: pt reported very limited ambulation due to bp ADLs Comments: indep but then needed more assist due to changes in feeling to dizzy to complete    OT Problem List: Decreased strength;Decreased activity tolerance;Impaired balance (sitting and/or standing);Decreased safety awareness;Decreased knowledge of use of DME or AE;Pain   OT Treatment/Interventions: Self-care/ADL training;Therapeutic exercise;DME  and/or AE instruction;Therapeutic activities;Patient/family education;Balance training      OT Goals(Current goals can be found in the care plan section)   Acute Rehab OT Goals Patient Stated Goal: to get better OT Goal Formulation: With patient Time For Goal Achievement: 05/23/24 Potential to Achieve Goals: Good   OT Frequency:  Min 2X/week    Co-evaluation              AM-PAC OT 6 Clicks Daily Activity     Outcome Measure Help from another person eating meals?: None Help from another person taking care of personal grooming?: A Little Help from another person toileting, which includes using toliet, bedpan, or urinal?: A Little Help from another person bathing (including washing, rinsing, drying)?: A Little Help from another person to put on and taking off regular upper body clothing?: A Little Help from another person to put on and taking off regular lower body clothing?: A Little 6 Click Score: 19   End of Session Equipment Utilized During Treatment: Gait belt;Rolling walker (2 wheels) Nurse Communication: Mobility status;Other (comment) (bp)  Activity Tolerance: Patient tolerated treatment well Patient left: in bed;with call  bell/phone within reach  OT Visit Diagnosis: Unsteadiness on feet (R26.81);Other abnormalities of gait and mobility (R26.89);Muscle weakness (generalized) (M62.81);Pain Pain - Right/Left:  (back)                Time: 9275-9186 OT Time Calculation (min): 49 min Charges:  OT General Charges $OT Visit: 1 Visit OT Evaluation $OT Eval Low Complexity: 1 Low OT Treatments $Self Care/Home Management : 23-37 mins  Warrick POUR OTR/L  Acute Rehab Services  [phone] office number   Warrick Berber 05/10/2024, 9:44 AM

## 2024-05-10 NOTE — Discharge Summary (Signed)
 Name: Brandi Hunter MRN: [id number] DOB: 09-27-1941 83 y.o. PCP: Medicine, [facility] (Inactive)  Date of Admission: 05/09/2024 10:40 AM Date of Discharge:  05/11/2024 Attending Physician: Dr. Eben  DISCHARGE DIAGNOSIS:  Primary Problem: Orthostatic hypotension   Hospital Problems: Principal Problem:   Orthostatic hypotension    DISCHARGE MEDICATIONS:   Allergies as of 05/11/2024       Reactions   Bee Venom Swelling   Latex Swelling   LATEX CATHETERS        Medication List     PAUSE taking these medications    diltiazem  120 MG 24 hr capsule Wait to take this until your doctor or other care provider tells you to start again. Commonly known as: CARDIZEM  CD TAKE 1 CAPSULE EVERY DAY   furosemide  20 MG tablet Wait to take this until your doctor or other care provider tells you to start again. Commonly known as: LASIX  TAKE 1 TABLET EVERY DAY   metFORMIN  500 MG tablet Wait to take this until your doctor or other care provider tells you to start again. Commonly known as: GLUCOPHAGE  Take 500 mg by mouth daily.   metoprolol  succinate 100 MG 24 hr tablet Wait to take this until your doctor or other care provider tells you to start again. Commonly known as: TOPROL -XL TAKE 1 TABLET (100 MG TOTAL) BY MOUTH DAILY. TAKE WITH OR IMMEDIATELY FOLLOWING A MEAL.   spironolactone  25 MG tablet Wait to take this until your doctor or other care provider tells you to start again. Commonly known as: ALDACTONE  TAKE 1/2 TABLET ONE TIME DAILY       STOP taking these medications    famotidine  20 MG tablet Commonly known as: PEPCID        TAKE these medications    acetaminophen  650 MG CR tablet Commonly known as: TYLENOL  Take 650 mg by mouth every 8 (eight) hours as needed for pain.   albuterol  108 (90 Base) MCG/ACT inhaler Commonly known as: VENTOLIN  HFA Inhale 2 puffs into the lungs every 4 (four) hours as needed for wheezing.   apixaban  2.5 MG Tabs  tablet Commonly known as: ELIQUIS  Take 1 tablet (2.5 mg total) by mouth 2 (two) times daily. What changed:  medication strength how much to take   atorvastatin  40 MG tablet Commonly known as: LIPITOR Take 40 mg by mouth at bedtime.   B-12 PO Take by mouth.   budesonide -formoterol  160-4.5 MCG/ACT inhaler Commonly known as: SYMBICORT  Inhale 2 puffs into the lungs 2 (two) times daily.   diclofenac  Sodium 1 % Gel Commonly known as: VOLTAREN  Apply 2 g topically 2 (two) times daily as needed (for pain).   DULoxetine  30 MG capsule Commonly known as: CYMBALTA  Take 30 mg by mouth at bedtime.   fenofibrate  160 MG tablet Take 160 mg by mouth every evening.   gabapentin  100 MG capsule Commonly known as: NEURONTIN  Take 200 mg by mouth at bedtime.   ipratropium-albuterol  0.5-2.5 (3) MG/3ML Soln Commonly known as: DUONEB Take 3 mLs by nebulization 4 (four) times daily. What changed:  when to take this reasons to take this   LORazepam  0.5 MG tablet Commonly known as: ATIVAN  Take 0.5-1 mg by mouth See admin instructions. Take half to one tablet (0.25 mg - 0.5 mg) by mouth twice daily as needed for anxiety   meclizine 25 MG tablet Commonly known as: ANTIVERT Take 25 mg by mouth 3 (three) times daily as needed for dizziness.   midodrine  2.5 MG tablet  Commonly known as: PROAMATINE  TAKE 1 TABLET TWICE DAILY WITH MEALS (DOSE DECREASE) What changed: See the new instructions.   nitroGLYCERIN  0.4 MG SL tablet Commonly known as: NITROSTAT  USE 1 TAB UNDER TONGUE EVERY 5 MINUTES AS NEEDED FOR CHEST PAIN FOR 3 DOSES. IF NO RELIEF AFTER 3RD TAB GO TO ER OR CALL 911 What changed: See the new instructions.   OXYGEN  Inhale 2 L into the lungs continuous.   pantoprazole  40 MG tablet Commonly known as: PROTONIX  Take 1 tablet (40 mg total) by mouth daily.   Vitamin D3 1.25 MG (50000 UT) Caps Take 1 capsule by mouth once a week.         DISPOSITION AND FOLLOW-UP:  Ms.Brandi Hunter  was discharged from [facility] in stable condition. At the hospital follow up visit please address:  Follow-up Recommendations: []  Repeat BMP to assess kidney function and electrolytes []  Monitor blood pressure after starting midodrine  and stopping diuretics []  Monitor A-fib rate after stopping metoprolol  and diltiazem  []  Increase Eliquis  to 5 mg BID if creatinine improves to <1.5 []  Follow-up esophagram results for dysphagia. Referral to GI if needed. []  Note that Lasix , Metoprolol , Cardizem , spironolactone , and spironolactone  was held, restart when clinically appropriate   Follow-up Appointments:  Follow-up Information     [facility], [facility] Follow up.   Specialty: Home Health Services Why: [name] will provide home health services.  They will call you in the next 24-48 hours to set up services. 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   #Orthostatic hypotension Patient presented with orthostatic hypotension (110/70 standing, 70/40 sitting) in the setting of 3-4 weeks of lightheadedness. The lightheadedness started after patient mistakenly discontinued her midodrine  instead of metoprolol  following an outpatient cardiology appointment. The lightheadedness limited her ability to cook since she was unable to stand for more than a few seconds, so patient also had decreased PO intake for the past 3-4 weeks. Therefore, etiology of orthostatic hypotension was likely multifactorial with dehydration and midodrine  discontinuation both contributing. She initially appeared volume down on  exam with dry mucous membranes and slightly prolonged cap refill, so she received 2.75L of IV fluids while hospitalized. She continued to experience orthostasis after fluid resuscitation (128/92 sitting, 79/65 standing), so she was started on midodrine  2.5 mg BID. Repeat orthostatics were negative (146/67 lying, 150/71 sitting, 131/90 standing). Her home spironolactone  25mg  daily, diltiazem  120mg  daily, metoprolol  XL 100 mg daily, and Lasix  20mg  once daily were held during hospitalization and at discharge. Her PCP can restart them if needed outpatient.    #AKI #CKD stage 3b #Hyperkalemia Admission labs notable for Cr 2.75 (GFR 17), which is elevated from her baseline of 1.3-1.6. Suspect AKI was secondary to decreased PO intake over the past several weeks as patient appeared dehydrated on exam. She received 2.75L of IV fluids while hospitalized with improvement in creatinine to 1.64. Potassium also mildly elevated at 5.2, which improved to 4.8 s/p Lokelma x1. Recommend repeat BMP outpatient to assess kidney function. Her home diuretics were held as above.   #  Dysphagia #GERD Patient reported globus sensation and food regurgitation when eating foods like breads and meats, which was concerning for esophageal stricture. Patient had a chronic history of GERD, dating back to at least 2012 on chart review. Most recent EGD in 07/2014 for GERD and nausea showed medium sized hiatal hernial. Patient was currently only taking pantoprazole  as needed, which was scheduled once daily while hospitalized. SLP was consulted and recommended esophagram. Esophagram results were pending at discharge. PCP can consider GI referral for possible EGD.   #HFpEF LVEF 60-65% 04/2020 #HTN Most recent echocardiogram in 04/2020 with EF 60-65%, no regional wall motion abnormalities, mild LVH with grade II diastolic dysfunction, severe left and right atrial dilation, mild to moderate mitral valve regurgitation, and normal RV function. Since  patient was dehydrated, orthostatic, and had and an AKI, her home spironolactone  25mg  daily, metoprolol  XL 100 mg daily, and Lasix  20mg  once daily were held during hospitalization and at discharge.  #Diabetic neuropathy #T2DM Glucose remained stable between 154-173. Repeat HgbA1c 6.5% this admission. Her home metformin  was held during hospitalization and at discharge since GFR low. She was continued on her home gabapentin  200 mg nightly and duloxetine  30 mg nightly while hospitalized.   #COPD #OSA Patient maintained normal saturations (SpO2 98-100%) on her home 2L [place] during the day and CPAP at night. There were no concerns for an exacerbation since patient denied cough or increased work of breathing. She received Breo-Ellipta as replacement for home Symbicort .    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 EKG on admission showed possible atrial flutter and ventricular paced rhythm, but this was unchanged from 02/2024. She was monitored on telemetry with no concerns. Her home diltiazem  and metoprolol  were held during hospitalization given orthostasis, but she remained rate controlled with HR<100. Since creatinine >1.5 and patient is >40 years old, also decreased Eliquis  dose to 2.5 mg BID for now.    #CAD #HLD Most recent lipid panel on chart review in 03/2018 with LDL 51, triglycerides 194, HDL 35, and total 125. Continued her home atorvastatin  40 mg daily and fenofibrate  160 mg daily while hospitalized. Recommend repeat lipid panel outpatient.   #Insomnia Patient is prescribed lorazepam  as needed at home for insomnia. She reported taking the lorazepam  almost nightly for the week prior to admission to help her sleep. This medication was held while hospitalized since she is already at  an increased risk for hospital delirium given her age but can be resumed at discharge.   SUBJECTIVE:  No significant overnight events. Patient is feeling better after receiving IV fluids and restarting midodrine . Her appetite has been decent. She was able to drink 6-7 cups of water  yesterday. Patient would like to be discharged around 3:30PM today since that is when her son gets off work.  Discharge Vitals:   BP (!) 149/85 (BP Location: Left Arm)   Pulse 72   Temp 98.3 F (36.8 C) (Oral)   Resp 18   Ht 5' 6 (1.676 m)   Wt 84.8 kg   SpO2 100%   BMI 30.17 kg/m   OBJECTIVE:  General: Older-appearing female sitting comfortably on edge of hospital bed, pleasant throughout interview, in NAD. HENT: Normocephalic and atraumatic. Nasal cannula in place. Oropharynx without lesions, erythema, or exudate. MMM. Eyes: Conjunctivae clear. PERRL.  Cardiovascular: Normal rate with regular rhythm (  ventricular paced). No murmurs, rubs, or gallops. Normal radial pulses bilaterally. No LE edema.  Pulmonary: Lungs CTAB. No wheezes, rales, or rhonchi. Normal respiratory effort on 2L [place]. Abdominal: Soft. Non-distended. Normal bowel sounds.    Neurological: Alert, responds appropriately to questions. Skin: Warm and dry. Cap refill <2 seconds. No skin tenting. Abrasions on shins.  Pertinent Labs, Studies, and Procedures:     Latest Ref Rng & Units 05/09/2024   11:31 AM 03/09/2023   10:50 AM 01/24/2023    2:19 PM  CBC  WBC 4.0 - 10.5 K/uL 5.8  6.7  5.5   Hemoglobin 12.0 - 15.0 g/dL 88.3  88.3  89.2   Hematocrit 36.0 - 46.0 % 37.2  36.3  33.1   Platelets 150 - 400 K/uL 290  301  241        Latest Ref Rng & Units 05/11/2024    2:25 AM 05/10/2024    1:55 PM 05/10/2024    2:16 AM  CMP  Glucose 70 - 99 mg/dL 844  786  826   BUN 8 - 23 mg/dL 33  39  43   Creatinine 0.44 - 1.00 mg/dL 8.35  8.21  7.87   Sodium 135 - 145 mmol/L 138  137  137   Potassium 3.5 - 5.1 mmol/L 4.8  5.1  5.2   Chloride 98 - 111  mmol/L 101  99  100   CO2 22 - 32 mmol/L 29  29  31    Calcium  8.9 - 10.3 mg/dL 8.8  8.9  9.1     Signed: [name], DO Internal Medicine Resident: PGY-1  Please contact the on call pager at: [phone]

## 2024-05-10 NOTE — Care Management Obs Status (Cosign Needed)
 MEDICARE OBSERVATION STATUS NOTIFICATION   Patient Details  Name: Brandi Hunter MRN: [id number] Date of Birth: 10-18-1941   Medicare Observation Status Notification Given:  Yes    [name], RN 05/10/2024, 10:06 AM

## 2024-05-10 NOTE — Discharge Instructions (Addendum)
 It was a pleasure taking care of you at [facility]! You were admitted for low blood pressure. You were treated with IV fluids and midodrine . You should continue to drink plenty of water  to stay hydrated in addition to taking midodrine  twice daily. Your kidney function was also lower than normal (acute kidney injury) from your dehydration, so we have made the following medication changes below. Please follow-up with your primary care doctor, so they can restart these medications as needed. They can also recheck your kidney function to see if it has improved. Start midodrine  2.5 mg twice daily Stop Lasix  20 mg daily Stop spironolactone  25 mg daily Stop metoprolol  100 mg daily Stop diltiazem  120 mg daily Stop metformin  500 mg twice daily Decrease Eliquis  to 2.5 mg twice daily

## 2024-05-10 NOTE — Progress Notes (Addendum)
 Transition of Care [facility]) - Inpatient Brief Assessment   Patient Details  Name: Brandi Hunter MRN: [id number] Date of Birth: 30-Jan-1941  Transition of Care [facility]) CM/SW Contact:    [name], RN Phone Number: 05/10/2024, 10:09 AM   Clinical Narrative: CM met with the patient at the bedside to discuss TOC needs.  The patient currently lives at [address] [place], [place] at son's home - son is currently hospitalized on 2H after heart surgery.  Patient has home oxygen  at 2L/min [place] thru [employer], CPAP, nebulizer, glucometer, Rolator at home.  [place] letter provided at the bedside.  I will follow up with the patient to offer home health services since patient was seen by OT this morning - PT pending.  05/10/24 1056- I met with the patient at the bedside to offer Medicare choice regarding home health agency and patient prefers [facility].  HH orders for Pt/OT were placed and MD requested to Co-sign.  [name] was called and accepted for services.  CM will continue to follow and patient will discharge home by car with son when stable.   Transition of Care Asessment: Insurance and Status: (P) Insurance coverage has been reviewed Patient has primary care physician: (P) Yes Home environment has been reviewed: (P) from home alone while son is hospitalized Prior level of function:: (P) rolator Prior/Current Home Services: (P) No current home services Social Drivers of Health Review: (P) SDOH reviewed needs interventions Readmission risk has been reviewed: (P) Yes Transition of care needs: (P) transition of care needs identified, TOC will continue to follow

## 2024-05-10 NOTE — Plan of Care (Signed)

## 2024-05-10 NOTE — Progress Notes (Signed)
 Subjective:  No significant overnight events. Patient was feeling fine at rest, but she was symptomatic when her blood pressure dropped during orthostatic vital signs. Her appetite has otherwise been decent. She drank 2-3 cups of water  overnight, but she will try to drink more water  today.   Objective:  Vital signs in last 24 hours: Vitals:   05/10/24 0012 05/10/24 0415 05/10/24 0752 05/10/24 0758  BP: (!) 141/87 (!) 124/97  (!) 128/92  Pulse: 70 72  70  Resp: 20 19  18   Temp: 97.6 F (36.4 C) 98.5 F (36.9 C)    TempSrc:  Oral    SpO2: 100% 100% 100% 100%  Weight:      Height:        Intake/Output Summary (Last 24 hours) at 05/10/2024 1323 Last data filed at 05/09/2024 1531 Gross per 24 hour  Intake 1000 ml  Output --  Net 1000 ml   General: Older-appearing female lying comfortably in hospital bed, pleasant throughout interview, in NAD. HENT: Normocephalic and atraumatic. Nasal cannula in place. Oropharynx without lesions, erythema, or exudate. Dry mucous membranes. Eyes: Conjunctivae clear. PERRL.  Cardiovascular: Normal rate with regular rhythm (ventricular paced). No murmurs, rubs, or gallops. Normal radial pulses bilaterally. No LE edema.  Pulmonary: Lungs CTAB. No wheezes, rales, or rhonchi.  Normal respiratory effort on 2L [place]. Abdominal: Soft. Non-distended. Normal bowel sounds.    Neurological: Alert, responds appropriately to questions. Skin: Warm and dry. Cap refill 2-3 seconds. Mild skin tenting. Abrasions on shins.   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   #Orthostatic hypotension Patient presented with orthostatic hypotension (110/70 standing,  70/40 sitting) in the setting of 3-4 weeks of lightheadedness. Suspect etiology is multifactorial with dehydration and midodrine  discontinuation both contributing. She continues to appear volume down on exam today with dry mucous membranes, slightly prolonged cap refill, and mild skin tenting. Will give an additional 1L LR bolus. Orthostatic vitals also remain positive (128/92 sitting, 79/65 standing), so will restart midodrine  2.5 mg BID. Will recheck orthostatic vital signs a few hours after fluid bolus and midodrine . Will also continue holding home medications. - s/p 1.25L NS bolus + midodrine  2.5 mg - Ordered IV 1L bolus x1 - Start midodrine  2.5mg  BID  - Repeat orthostatic vital signs this afternoon - PT/OT following - Telemetry - Hold home spironolactone  25mg  daily - Hold home diltiazem  120mg  daily - Hold home metoprolol  XL 100 mg daily - Hold home Lasix  20mg  once daily   #AKI #CKD stage 3b #Hyperkalemia Creatinine improving from 2.75 to 2.12 this morning, which is elevated from her baseline of 1.3-1.6. Potassium also mildly elevated at 5.2. Suspect AKI is secondary to decreased PO intake over the past several weeks as patient still appears dehydrated on exam. She already received 1.25L of IV fluids, but will give another 1L LR bolus today. Will also give Lokelma x1 for hyperkalemia. Will repeat BMP after additional fluid bolus. - s/p 1.25L NS bolus - Ordered IV 1L LR bolus x1 - Repeat BMP @ 1300 and tomorrow AM   #Dysphagia #GERD Patient reports globus sensation and food regurgitation when eating foods like breads and meats, which is concerning for esophageal stricture.  Patient has a chronic history of GERD, dating back to at least 2012 on chart review. Most recent EGD in 07/2014 for GERD and nausea showed medium sized hiatal hernial. Patient currently only takes pantoprazole  as needed. SLP was consulted and recommended esophagram, which may have to be completed outpatient depending on  availability. - SLP consulted -> signed off - Esophagram - Pantoprazole  40 mg daily   #HFpEF LVEF 60-65% 04/2020 #HTN Patient continues to appear volume down on exam. Most recent echocardiogram in 04/2020 with EF 60-65%, no regional wall motion abnormalities, mild LVH with grade II diastolic dysfunction, severely left and right atrial dilation, mild to moderate mitral valve regurgitation, and normal RV function. Will hold home medications for now.  Plan: - Hold home spironolactone  25mg  daily - Hold home metoprolol  XL 100 mg daily - Hold home Lasix  20mg  once daily   #Diabetic neuropathy #T2DM Glucose currently stable between 154-221. HgbA1c 6.5 this admission. Will check glucose every morning. Will hold home metformin  for now since GFR low. - CBG every morning - Hold home metformin  500 mg BID - Home gabapentin  200mg  nightly - Home duloxetine  30 mg nightly   #COPD #OSA Patient is saturating well (SpO2 100%) on home 2L [place]. No concerns for exacerbation since patient denies cough or increased work of breathing. Will continue on home supplemental oxygen  during the day and CPAP at night. Continue Breo-Ellipta as replacement for home Symbicort . 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 EKG with possible atrial flutter and ventricular paced rhythm, but this is unchanged from 02/2024. Will monitor on telemetry. Will hold home diltiazem  and metoprolol  given orthostasis. Since creatinine >1.5 and patient is >73 years old, will also decrease Eliquis  dose to 2.5 mg BID for now. - Decrease Eliquis  to 2.5mg  BID - Hold home diltiazem  120mg  daily - Hold home metoprolol  XL 100 mg daily -  Telemetry   #CAD #HLD Most recent lipid panel on chart review in 03/2018 with LDL 51, triglycerides 194, HDL 35, and total 125. Will continue home medications while hospitalized, but recommend repeat lipid panel outpatient. - Home atorvastatin  40mg  daily - Home fenofibrate  160mg  daily - Outpatient lipid panel   #Insomnia Patient is prescribed lorazepam  as needed at home for insomnia. She reports taking the lorazepam  almost nightly for the past week to help her sleep. Will hold while hospitalized since she is already at an increased risk for hospital delirium given her age. - Hold home lorazepam  0.5mg  PRN   LOS: 0 days   Koleen Vernell BRAVO, Medical Student 05/10/2024, 1:23 PM

## 2024-05-11 ENCOUNTER — Observation Stay (HOSPITAL_COMMUNITY)

## 2024-05-11 DIAGNOSIS — N179 Acute kidney failure, unspecified: Secondary | ICD-10-CM | POA: Diagnosis not present

## 2024-05-11 DIAGNOSIS — I951 Orthostatic hypotension: Secondary | ICD-10-CM | POA: Diagnosis not present

## 2024-05-11 LAB — BASIC METABOLIC PANEL WITH GFR
Anion gap: 8 (ref 5–15)
BUN: 33 mg/dL — ABNORMAL HIGH (ref 8–23)
CO2: 29 mmol/L (ref 22–32)
Calcium: 8.8 mg/dL — ABNORMAL LOW (ref 8.9–10.3)
Chloride: 101 mmol/L (ref 98–111)
Creatinine, Ser: 1.64 mg/dL — ABNORMAL HIGH (ref 0.44–1.00)
GFR, Estimated: 31 mL/min — ABNORMAL LOW (ref >60)
Glucose, Bld: 155 mg/dL — ABNORMAL HIGH (ref 70–99)
Potassium: 4.8 mmol/L (ref 3.5–5.1)
Sodium: 138 mmol/L (ref 135–145)

## 2024-05-11 LAB — GLUCOSE, CAPILLARY: Glucose-Capillary: 136 mg/dL — ABNORMAL HIGH (ref 70–99)

## 2024-05-11 MED ORDER — SODIUM CHLORIDE 0.9% IV SOLUTION: Freq: Once | INTRAVENOUS | Status: AC

## 2024-05-11 MED ORDER — SODIUM CHLORIDE 0.9 % IV BOLUS
500.0000 mL | Freq: Once | INTRAVENOUS | Status: AC
  Administered 2024-05-11: 500 mL via INTRAVENOUS

## 2024-05-11 NOTE — Progress Notes (Signed)
 Mobility Specialist: Progress Note   05/11/24 1212  Mobility  Activity Ambulated with assistance in hallway  Level of Assistance Contact guard assist, steadying assist  Assistive Device Front wheel walker  Distance Ambulated (ft) 90 ft  Activity Response Tolerated well  Mobility Referral Yes  Mobility visit 1 Mobility  Mobility Specialist Start Time (ACUTE ONLY) 1000  Mobility Specialist Stop Time (ACUTE ONLY) 1017  Mobility Specialist Time Calculation (min) (ACUTE ONLY) 17 min    Pt received in bed, agreeable to mobility session. CG throughout. SpO2 97% on 2LO2. No complaints. Returned to room without fault. Left in chair with all needs met, call bell in reach.   Ileana Lute Mobility Specialist Please contact via SecureChat or Rehab office at [phone]

## 2024-05-11 NOTE — Plan of Care (Signed)

## 2024-05-11 NOTE — Plan of Care (Signed)

## 2024-05-11 NOTE — Progress Notes (Signed)
 Physical Therapy Treatment Patient Details Name: Brandi Hunter MRN: [id number] DOB: 1941/09/23 Today's Date: 05/11/2024   History of Present Illness Pt is a 83 yr old female who presented 05/09/24 due to low BP.  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  Patient relates more back pain today though had meds and rub on her back, stating time for an injection though also discussed hospital bed likely contributing.  Feel stable for home with intermittent help from her son when medically ready.   Orthostatic VS for the past 24 hrs (Last 3 readings):  BP- Lying Pulse- Lying BP- Sitting Pulse- Sitting BP- Standing at 0 minutes Pulse- Standing at 0 minutes BP- Standing at 3 minutes Pulse- Standing at 3 minutes  05/11/24 1300 146/67 70 150/71 70 131/90 72 -- --      If plan is discharge home, recommend the following: Help with stairs or ramp for entrance;Assist for transportation;Assistance with cooking/housework   Can travel by private vehicle        Equipment Recommendations  None recommended by PT    Recommendations for Other Services       Precautions / Restrictions Precautions Precautions: Fall Recall of Precautions/Restrictions: Intact Precaution/Restrictions Comments: watch BP Required Braces or Orthoses: Other Brace Other Brace: knee hi TEDS     Mobility  Bed Mobility Overal bed mobility: Modified Independent                  Transfers Overall transfer level: Needs assistance Equipment used: Rolling walker (2 wheels)   Sit to Stand: Supervision           General transfer comment: assist for lines, O2 and IV    Ambulation/Gait Ambulation/Gait assistance: Supervision Gait Distance (Feet): 90 Feet Assistive device: Rolling  walker (2 wheels) Gait Pattern/deviations: Step-through pattern, Decreased stride length, Drifts right/left       General Gait Details: in hallway with some veering, 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, no LOB   Stairs             Wheelchair Mobility     Tilt Bed    Modified Rankin (Stroke Patients Only)       Balance Overall balance assessment: Needs assistance   Sitting balance-Leahy Scale: Good       Standing balance-Leahy Scale: Fair Standing balance comment: washing hands at sink; completed toileting including clothing management with S                            Communication Communication Communication: No apparent difficulties  Cognition Arousal: Alert Behavior During Therapy: WFL for tasks assessed/performed   PT - Cognitive impairments: No apparent impairments                         Following commands: Intact      Cueing Cueing Techniques: Verbal cues  Exercises      General Comments General comments (skin integrity, edema, etc.): SpO2 after ambulation on 3L 96%      Pertinent Vitals/Pain Pain Assessment Pain Assessment: Faces Faces Pain Scale: Hurts even more Pain Location: back/neck Pain Descriptors / Indicators: Aching Pain Intervention(s): Monitored during session, Repositioned    Home Living  Prior Function            PT Goals (current goals can now be found in the care plan section) Progress towards PT goals: Progressing toward goals    Frequency    Min 2X/week      PT Plan      Co-evaluation              AM-PAC PT 6 Clicks Mobility   Outcome Measure  Help needed turning from your back to your side while in a flat bed without using bedrails?: None Help needed moving from lying on your back to sitting on the side of a flat bed without using bedrails?: None Help needed moving to and from a bed to a chair (including a wheelchair)?: A Little Help  needed standing up from a chair using your arms (e.g., wheelchair or bedside chair)?: None Help needed to walk in hospital room?: A Little Help needed climbing 3-5 steps with a railing? : A Little 6 Click Score: 21    End of Session Equipment Utilized During Treatment: Gait belt;Oxygen  Activity Tolerance: Patient tolerated treatment well Patient left: in bed;with call bell/phone within reach   PT Visit Diagnosis: Muscle weakness (generalized) (M62.81)     Time: 8745-8682 PT Time Calculation (min) (ACUTE ONLY): 23 min  Charges:    $Gait Training: 8-22 mins $Therapeutic Activity: 8-22 mins PT General Charges $$ ACUTE PT VISIT: 1 Visit                     Brandi Hunter, PT Acute Rehabilitation Services Office:[phone] 05/11/2024    Brandi Hunter 05/11/2024, 1:28 PM

## 2024-05-12 ENCOUNTER — Ambulatory Visit: Payer: Self-pay | Admitting: Infectious Diseases

## 2024-05-30 ENCOUNTER — Telehealth: Payer: Self-pay | Admitting: Cardiology

## 2024-05-30 NOTE — Telephone Encounter (Signed)
 Difficult management given she has had both prior HTN and orthostatic hypotension, on low dose midodrine  with reported high bp's at home. Can we please verify she is getting accurate bp's at home, meaning sitting for at least 5 minutes, arm at rest, both feet flat on the floor. If has not been checking this way please check next 2 days and update us , Dr Debera will be back later this week.   JINNY Ross MD

## 2024-05-30 NOTE — Telephone Encounter (Signed)
 Pt c/o BP issue: STAT if pt c/o blurred vision, one-sided weakness or slurred speech.  STAT if BP is GREATER than 180/120 TODAY.  STAT if BP is LESS than 90/60 and SYMPTOMATIC TODAY  1. What is your BP concern? ^BP  2. Have you taken any BP medication today? No  3. What are your last 5 BP readings? This morning 192/92, yesterday 187/100, 7/9-165/90 , 7/8- 175/96  4. Are you having any other symptoms (ex. Dizziness, headache, blurred vision, passed out)? No but son passed away last week

## 2024-05-30 NOTE — Telephone Encounter (Signed)
 Pt notified of Dr. Ranae response. She will monitor BP for the next 2 days and report readings on Friday.

## 2024-05-30 NOTE — Telephone Encounter (Signed)
 Pt states that she was in the hospital and told to hold Diltiazem , toprol  XL, Lasix  and Spironolactone . Pt told to take Midodrine  as prescribed. She states that she did take Lasix  3-4 times d/t weight gain of 2 lbs overnight or 5 lbs in one week. Pt feel on Wednesday of last week and was seen at the urgent care. Denies being dizzy, chest pain and headache. She has not taken her midodrine  yet today. Please advise. Will forward to DOD and Dr. Debera.

## 2024-06-02 ENCOUNTER — Ambulatory Visit: Attending: Student | Admitting: Student

## 2024-06-02 ENCOUNTER — Encounter: Payer: Self-pay | Admitting: Student

## 2024-06-02 VITALS — BP 150/70 | HR 70 | Ht 66.0 in | Wt 197.2 lb

## 2024-06-02 DIAGNOSIS — R42 Dizziness and giddiness: Secondary | ICD-10-CM | POA: Diagnosis not present

## 2024-06-02 DIAGNOSIS — I4819 Other persistent atrial fibrillation: Secondary | ICD-10-CM

## 2024-06-02 DIAGNOSIS — I2510 Atherosclerotic heart disease of native coronary artery without angina pectoris: Secondary | ICD-10-CM

## 2024-06-02 DIAGNOSIS — G4733 Obstructive sleep apnea (adult) (pediatric): Secondary | ICD-10-CM

## 2024-06-02 DIAGNOSIS — N1832 Chronic kidney disease, stage 3b: Secondary | ICD-10-CM

## 2024-06-02 DIAGNOSIS — E785 Hyperlipidemia, unspecified: Secondary | ICD-10-CM

## 2024-06-02 DIAGNOSIS — I441 Atrioventricular block, second degree: Secondary | ICD-10-CM

## 2024-06-02 NOTE — Telephone Encounter (Signed)
 Pt calling to report her BP reading Tuesday:192/92 Wednesday:202/106 Thursday: 191/105 Friday: 182/102

## 2024-06-02 NOTE — Progress Notes (Signed)
 Cardiology Office Note    Date:  06/02/2024  ID:  Brandi Hunter, DOB October 11, 1941, MRN [id number] Cardiologist: Jayson Sierras, MD Electrophysiologist:  Eulas FORBES Furbish, MD { :  History of Present Illness:    Brandi Hunter is a 83 y.o. female with past medical history of CAD (catheterization in 03/2018 showed 90% ostial OM stenosis with medical management recommended at that time), persistent atrial fibrillation (on Eliquis  for anticoagulation), 2nd degree AV Block (s/p PPM placement in 2011), HTN, HLD, Type 2 DM, COPD, OSA and prior CVA who presents to the office today for evaluation of elevated blood pressure.  She was last examined by Dr. Sierras in 02/2024 and reported occasional, brief episodes of chest pain but not felt to be consistent with angina. She was continued on her current cardiac medications including Atorvastatin  40 mg daily, Cardizem  CD 120 mg daily, Eliquis  2.5 mg twice daily, Lasix  20 mg daily, Midodrine  2.5 mg twice daily, Toprol -XL 100 mg daily and Spironolactone  12.5 mg daily. Follow-up labs did show improvement in her renal function and Eliquis  was titrated to 5 mg twice daily.  In the interim, she was admitted to [facility] from 6/24 - 05/11/2024 for orthostatic hypotension and an AKI. Creatinine peaked at 2.75 during admission and had improved to 1.64 at the time of discharge. She received almost 3 L of IV fluids during admission and was restarted on Midodrine  2.5 mg twice daily. At the time of discharge, Cardizem  CD 120 mg daily, Lasix  20 mg daily, Toprol -XL 100 mg daily and Spironolactone  12.5 mg daily were held. Eliquis  was reduced to 2.5 mg twice daily given worsening renal function.  By review of Care Everywhere, she did follow-up with her PCP on 05/25/2024 and BP was at 138/76. No changes were made at that time. She contacted the office today reporting significantly elevated blood pressure with readings at 202/106, 191/105 and 182/102 over the past few days. A follow-up  visit was therefore arranged.  In talking with the patient and her son today, she does report that she has been under increased stress as one of her sons passed away earlier this month after a prolonged hospitalization. She has been trying to increase her fluid intake since her hospitalization and is consuming at least 3 bottles of water  a day. She has remained on Midodrine  and has not yet resumed medications that were stopped during admission. Only used Lasix  a few times for acute weight gain. Denies any orthopnea, PND or pitting edema. Uses supplemental oxygen  at home and CPAP at night. She has been wearing compression stockings.No recent chest pain but does experience occasional palpitations in the setting of atrial fibrillation.  Studies Reviewed:   EKG: EKG is not ordered today.   Echocardiogram: 04/2020 IMPRESSIONS     1. Left ventricular ejection fraction, by estimation, is 60 to 65%. The  left ventricle has normal function. The left ventricle has no regional  wall motion abnormalities. There is mild left ventricular hypertrophy.  Left ventricular diastolic parameters  are consistent with Grade II diastolic dysfunction (pseudonormalization).  Elevated left atrial pressure.   2. Right ventricular systolic function is normal. The right ventricular  size is normal. There is moderately elevated pulmonary artery systolic  pressure.   3. Left atrial size was severely dilated.   4. Right atrial size was severely dilated.   5. The mitral valve is normal in structure. Mild to moderate mitral valve  regurgitation. No evidence of mitral stenosis.   6. The  aortic valve has an indeterminant number of cusps. Aortic valve  regurgitation is not visualized. No aortic stenosis is present.   7. The inferior vena cava is normal in size with greater than 50%  respiratory variability, suggesting right atrial pressure of 3 mmHg.    Risk Assessment/Calculations:    CHA2DS2-VASc Score = 8  indicates  a 10.8% annual risk of stroke. The patient's score is based upon: CHF History: 0 HTN History: 1 Diabetes History: 1 Stroke History: 2 Vascular Disease History: 1 Age Score: 2 Gender Score: 1    Physical Exam:   VS:  BP (!) 150/70 (BP Location: Right Arm, Cuff Size: Large)   Pulse 70   Ht 5' 6 (1.676 m)   Wt 197 lb 3.2 oz (89.4 kg)   SpO2 94%   BMI 31.83 kg/m    Wt Readings from Last 3 Encounters:  06/02/24 197 lb 3.2 oz (89.4 kg)  05/09/24 186 lb 15.2 oz (84.8 kg)  03/02/24 187 lb (84.8 kg)     GEN: Pleasant, elderly female appearing in no acute distress NECK: No JVD; No carotid bruits CARDIAC: Irregular irregular, no murmurs, rubs, gallops RESPIRATORY:  Clear to auscultation without rales, wheezing or rhonchi  ABDOMEN: Appears non-distended. No obvious abdominal masses. EXTREMITIES: No clubbing or cyanosis. No pitting edema.  Distal pedal pulses are 2+ bilaterally.  Compression stockings in place.   Assessment and Plan:   1. Coronary artery disease involving native coronary artery of native heart without angina pectoris - Prior cardiac catheterization in 2019 showed 90% ostial OM stenosis with medical management recommended at that time.  - She denies any recent anginal symptoms. Continue Atorvastatin  40 mg daily. She is not on ASA given the need for anticoagulation.  2. Persistent atrial fibrillation (HCC) - She was previously on Cardizem  CD 120 mg daily and Toprol -XL 100 mg daily as outlined above for rate control but these were stopped during her admission given orthostatic hypotension. Rates are in the 70's today. Will follow BP at home as discussed below and consider restarting either Toprol -XL or Cardizem  CD based off results. - She is on Eliquis  2.5 mg twice daily for anticoagulation which is the correct dose given her current age, weight and renal function.  3. Second degree heart block - She does have a PPM in place which is followed by Dr. Nancey. Recent  device interrogation in 03/2024 showed normal device function.  4. Orthostatic dizziness - As discussed above, her blood pressure has actually been elevated when checked at home and orthostatics were negative when checked in clinic today. Given her elevated BP at baseline, will stop Midodrine  2.5 mg twice daily. I encouraged her to report back with BP readings next Monday or Tuesday (offered [place] but she prefers to call). If BP remains above goal, would favor restarting Toprol -XL or Cardizem  CD given that her renal function is still above baseline.  5. Hyperlipidemia LDL goal <70 - LDL was at 69 when checked in 04/2023. Continue current medical therapy with Atorvastatin  40 mg daily.  6. OSA (obstructive sleep apnea) - Continued compliance with CPAP encouraged.  7. Stage 3b chronic kidney disease (CKD) (HCC) - Being followed by Dr. Windle with [facility]. Creatinine peaked at 2.75 during her recent admission and had improved to 1.64 at the time of hospital discharge. At 1.71 when checked on 05/25/2024 by review of Care Everywhere.   Disposition: She has previously scheduled follow-up with Almarie Crate, NP on 06/16/2024 as she has an  appoint with Dr. Nancey as well in the [place] office prior to this. For now, she will keep that appointment. We reviewed that if BP stabilizes and she is overall feeling well, could cancel and reschedule for a few months from now. Would still keep her EP appointment for follow-up of her PPM.  Signed, Laymon CHRISTELLA Qua, PA-C

## 2024-06-02 NOTE — Patient Instructions (Addendum)
 Medication Instructions:  Your physician has recommended you make the following change in your medication:   Call office on Tuesday with blood pressure readings   Stop Taking Midodrine    *If you need a refill on your cardiac medications before your next appointment, please call your pharmacy*  Lab Work: NONE   If you have labs (blood work) drawn today and your tests are completely normal, you will receive your results only by: MyChart Message (if you have MyChart) OR A paper copy in the mail If you have any lab test that is abnormal or we need to change your treatment, we will call you to review the results.  Testing/Procedures: NONE   Follow-Up: At [facility], you and your health needs are our priority.  As part of our continuing mission to provide you with exceptional heart care, our providers are all part of one team.  This team includes your primary Cardiologist (physician) and Advanced Practice Providers or APPs (Physician Assistants and Nurse Practitioners) who all work together to provide you with the care you need, when you need it.  Your next appointment:    August   Provider:   Almarie Crate, NP    We recommend signing up for the patient portal called MyChart.  Sign up information is provided on this After Visit Summary.  MyChart is used to connect with patients for Virtual Visits (Telemedicine).  Patients are able to view lab/test results, encounter notes, upcoming appointments, etc.  Non-urgent messages can be sent to your provider as well.   To learn more about what you can do with MyChart, go to [email or web address].   Other Instructions Thank you for choosing [facility]!

## 2024-06-02 NOTE — Telephone Encounter (Signed)
 Spoke with pt who states that BP readings were obtained prior to taking Midodrine  today. She does report a headache but denies chest pain, SOB and dizziness. Please advise.

## 2024-06-06 ENCOUNTER — Telehealth: Payer: Self-pay | Admitting: Cardiology

## 2024-06-06 MED ORDER — METOPROLOL SUCCINATE ER 50 MG PO TB24: 50.0000 mg | ORAL_TABLET | Freq: Every day | ORAL | 3 refills | Status: DC

## 2024-06-06 NOTE — Telephone Encounter (Signed)
 Patient verbalized new dose for toprol  is 50 mg daily She will cut the 100 mg tablets she already has.  She will bring BP log to 06/16/24 visit in the [place] office

## 2024-06-06 NOTE — Telephone Encounter (Signed)
 Pt stated she was to call in and give her last 4 day for BP readings  7/19- 142/71  7/20- 149/74 7/21-157/74 7/22- 149/73  Best number [phone]

## 2024-06-06 NOTE — Telephone Encounter (Signed)
   Thank you for the update. Given that her blood pressure remains elevated despite stopping Midodrine , I would recommend restarting Toprol -XL at a lower dose of 50 mg daily (can cut 100mg  tablets in half). Please report back with BP readings in 1-2 weeks as this may need to be titrated to her prior dose of 100mg  daily but would make gradual changes given her recent orthostatic hypotension.  Signed, Laymon CHRISTELLA Qua, PA-C 06/06/2024, 3:24 PM

## 2024-06-16 ENCOUNTER — Ambulatory Visit: Payer: Medicare HMO | Attending: Cardiovascular Disease | Admitting: Cardiovascular Disease

## 2024-06-16 ENCOUNTER — Ambulatory Visit (INDEPENDENT_AMBULATORY_CARE_PROVIDER_SITE_OTHER): Admitting: Nurse Practitioner

## 2024-06-16 ENCOUNTER — Encounter: Payer: Self-pay | Admitting: Nurse Practitioner

## 2024-06-16 ENCOUNTER — Encounter: Payer: Self-pay | Admitting: Cardiovascular Disease

## 2024-06-16 VITALS — BP 140/80 | HR 70 | Ht 66.0 in | Wt 190.0 lb

## 2024-06-16 VITALS — BP 149/83 | HR 70 | Ht 66.0 in | Wt 190.0 lb

## 2024-06-16 DIAGNOSIS — I4891 Unspecified atrial fibrillation: Secondary | ICD-10-CM

## 2024-06-16 DIAGNOSIS — R42 Dizziness and giddiness: Secondary | ICD-10-CM | POA: Diagnosis not present

## 2024-06-16 DIAGNOSIS — E785 Hyperlipidemia, unspecified: Secondary | ICD-10-CM

## 2024-06-16 DIAGNOSIS — I441 Atrioventricular block, second degree: Secondary | ICD-10-CM | POA: Diagnosis not present

## 2024-06-16 DIAGNOSIS — R6889 Other general symptoms and signs: Secondary | ICD-10-CM

## 2024-06-16 DIAGNOSIS — N1832 Chronic kidney disease, stage 3b: Secondary | ICD-10-CM

## 2024-06-16 DIAGNOSIS — I2510 Atherosclerotic heart disease of native coronary artery without angina pectoris: Secondary | ICD-10-CM

## 2024-06-16 DIAGNOSIS — I4819 Other persistent atrial fibrillation: Secondary | ICD-10-CM

## 2024-06-16 DIAGNOSIS — Z950 Presence of cardiac pacemaker: Secondary | ICD-10-CM

## 2024-06-16 DIAGNOSIS — Z8673 Personal history of transient ischemic attack (TIA), and cerebral infarction without residual deficits: Secondary | ICD-10-CM

## 2024-06-16 DIAGNOSIS — I10 Essential (primary) hypertension: Secondary | ICD-10-CM

## 2024-06-16 MED ORDER — METOPROLOL SUCCINATE ER 50 MG PO TB24
50.0000 mg | ORAL_TABLET | Freq: Two times a day (BID) | ORAL | 1 refills | Status: DC
Start: 2024-06-16 — End: 2024-08-24

## 2024-06-16 NOTE — Patient Instructions (Addendum)
 Medication Instructions:  Continue all current medications.   Labwork: none  Testing/Procedures: none  Follow-Up: 6 months   Any Other Special Instructions Will Be Listed Below (If Applicable).   If you need a refill on your cardiac medications before your next appointment, please call your pharmacy.

## 2024-06-16 NOTE — Progress Notes (Addendum)
 Cardiology Office Note   Date: 06/16/2024 ID:  Brandi Hunter, DOB 25-Dec-1940, MRN [id number] PCP: Medicine, [facility] (Inactive)  [facility] Providers Cardiologist:  Jayson Sierras, MD Electrophysiologist:  Eulas FORBES Furbish, MD     History of Present Illness Brandi Hunter is a 83 y.o. female with a PMH of CAD, hypertension, hyperlipidemia, persistent A-fib, second-degree AV block, s/p PPM in 2011, COPD, type 2 diabetes, OSA, history of CVA, and CKD stage IIIb, who presents today for hospital follow-up.  Last seen by Brandi Qua, PA-C on June 02, 2024.  Patient reported being under increased stress as one of her sons passed away earlier that month.  Was trying to increase her fluid intake after her hospitalization.  Reported only using Lasix  a few times for acute weight gain.  Overall was doing well.  Reported occasional palpitations due to A-fib.  It was reported that she was previously on Cardizem  CD 120 mg daily and Toprol -XL 100 mg daily for rate control but these medications were stopped during her past hospital admission given her orthostatic hypotension.  Heart rate was well-controlled with heart rate in the 70s that day.  It was recommended to follow BP and consider restarting Toprol -XL and Cardizem  CD based off results.  Orthostatics negative that were checked in clinic that day.  Midodrine  was stopped due to her elevated BP at baseline.  She was encouraged to report back with her BP readings.  It was recommended if BP remain above goal, would favor restarting Toprol -XL or Cardizem  CD.  Today she presents for follow-up.  She gives me her BP log and shows SBP averaging 140-170's. She continues to do well overall. Denies any chest pain, shortness of breath, palpitations, syncope, presyncope,  orthopnea, PND, swelling or significant weight changes, acute bleeding, or claudication. Does note some dizziness at times.   ROS: Negative. See HPI.   Studies  Reviewed  EKG: EKG is not ordered today.   Echo 04/2020:  1. Left ventricular ejection fraction, by estimation, is 60 to 65%. The  left ventricle has normal function. The left ventricle has no regional  wall motion abnormalities. There is mild left ventricular hypertrophy.  Left ventricular diastolic parameters  are consistent with Grade II diastolic dysfunction (pseudonormalization).  Elevated left atrial pressure.   2. Right ventricular systolic function is normal. The right ventricular  size is normal. There is moderately elevated pulmonary artery systolic  pressure.   3. Left atrial size was severely dilated.   4. Right atrial size was severely dilated.   5. The mitral valve is normal in structure. Mild to moderate mitral valve  regurgitation. No evidence of mitral stenosis.   6. The aortic valve has an indeterminant number of cusps. Aortic valve  regurgitation is not visualized. No aortic stenosis is present.   7. The inferior vena cava is normal in size with greater than 50%  respiratory variability, suggesting right atrial pressure of 3 mmHg.  LHC 03/2018:   IMPRESSION: [name] has a 90% ostial OM inferior subbranch stenosis.  That is her only obstructive lesion in her coronary tree.  She says she has minimal angina.  She has normal LV function.  At this point, I recommend medical therapy.  Should she have recalcitrant symptoms she would be a candidate for Cutting Balloon atherectomy of the origin of that subbranch.  The radial sheath was removed and a TR band was placed in the right wrist to achieve patent hemostasis.  A femoral  angiogram was performed and a minx closure device was deployed successfully achieving hemostasis.  Patient left the lab in stable condition.  She can restart her Eliquis  in the next 48 hours.   [name]. MD, [facility] 03/24/2018 3:04 PM    Myoview  04/2017:  Nuclear stress EF: 65%. Probable normal perfusion and soft tissue attenuation This is a low risk  study.   Physical Exam VS:  BP (!) 140/80   Pulse 70   Ht 5' 6 (1.676 m)   Wt 190 lb (86.2 kg)   SpO2 96%   BMI 30.67 kg/m         Wt Readings from Last 3 Encounters:  06/16/24 190 lb (86.2 kg)  06/16/24 190 lb (86.2 kg)  06/02/24 197 lb 3.2 oz (89.4 kg)    GEN: Well nourished, well developed in no acute distress NECK: No JVD; No carotid bruits CARDIAC: S1/S2, RRR, no murmurs, rubs, gallops RESPIRATORY:  Clear to auscultation without rales, wheezing or rhonchi  ABDOMEN: Soft, non-tender, non-distended EXTREMITIES:  No edema; No deformity   ASSESSMENT AND PLAN  HTN, orthostatic dizziness, BP alterations BP elevated today with BP alternations in both arms. Will arrange carotid duplex for further evaluation. Instructed her to increase Toprol  XL to 50 mg BID. Will bring her back in 1-2 weeks for a BP check. Discussed to monitor BP at home at least 2 hours after medications and sitting for 5-10 minutes. GDMT limited d/t past hx of orthostatic hypotension during hospitalization, although past office visit orthostatics were negative. Heart healthy diet and regular cardiovascular exercise encouraged.   CAD Stable with no anginal symptoms. No indication for ischemic evaluation.  Not on aspirin  due to being on Eliquis .  Continue current medication regimen.  Adjusting Toprol -XL as noted above. Heart healthy diet and regular cardiovascular exercise encouraged.   HLD LDL 69 from 1 year ago.  Continue atorvastatin .  This is being managed by her PCP. Heart healthy diet and regular cardiovascular exercise encouraged.   A-fib Denies any tachycardia or palpitations.  Heart rate is well-controlled.  No longer on diltiazem  due to past history of orthostatic hypotension.  Will increase Toprol -XL as noted above. Heart healthy diet and regular cardiovascular exercise encouraged.  Continue Eliquis  for stroke prevention.  Second degree AV block, s/p PPM Denies any issues.  Most recent remote device  check revealed no clinically significant arrhythmia noted.  Normal device function noted.  Continue follow-up with EP.   CKD stage 3b Most recent kidney function stable.  Avoid nephrotoxic agents.  No medication changes at this time besides what is noted above.  Continue follow-up with PCP.  Continue to follow-up with nephrology.   Hx of CVA  Denies any issues or symptoms.  Continue current medication regimen.  She is not on aspirin  due to being on low-dose Eliquis  and is on appropriate dosage due to her age and kidney function. Heart healthy diet and regular cardiovascular exercise encouraged.  Continue to follow with PCP.   Dispo: Care and ED precautions discussed.  Follow-up with MD/APP in 6 to 8 weeks or sooner if any changes.  Signed, Almarie Crate, NP

## 2024-06-16 NOTE — Progress Notes (Signed)
    PCP: Medicine, [facility] (Inactive) Primary Cardiologist: Dr Debera Primary EP:  Dr Nancey [name] is a 83 y.o. female who presents today for routine electrophysiology followup.  Since last being seen in our clinic, the patient reports doing reasonably well.  SOB is stable.  She is wearing O2 frequently.    She underwent a generator change on March 15, 2023.  She has recovered well from the procedure  Today, she denies symptoms of palpitations, chest pain,  lower extremity edema, dizziness, presyncope, or syncope.  The patient is otherwise without complaint today.   Device detected an episode of NSVT on 7/9. She does not recall any symptoms; this occurred the day after she buried her youngest son who passed away after a prolonged hospitalization with complications from CHF; he had an LVAD.   Physical Exam: Vitals:   06/16/24 0942  BP: (!) 149/83  Pulse: 70  SpO2: 92%  Weight: 190 lb (86.2 kg)  Height: 5' 6 (1.676 m)     Gen: Appears comfortable, well-nourished CV: RRR, no dependent edema The device site is normal -- no tenderness, edema, drainage, redness, threatened erosion. Pulm: breathing easily   Pacemaker interrogation- reviewed in detail today,  See PACEART report    Assessment and Plan:  1. 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 Permanent afib Chads2vasc score is 7.   She is on eliquis  2.5 -- Labs 05/11/24 reviewed Rate controlled  3. HTN BP above goal today I am not making any changes today  4. CAD No ischemic symptoms  5. Chronic diastolic dysfunction Stable No change required today  Return in a year  Eulas FORBES Nancey, MD 06/16/2024 9:59 AM

## 2024-06-16 NOTE — Patient Instructions (Addendum)
 Medication Instructions:  Your physician has recommended you make the following change in your medication:  Please Increase Toprol  XL to 50 Mg twice daily   Labwork: None   Testing/Procedures: Your physician has requested that you have a carotid duplex. This test is an ultrasound of the carotid arteries in your neck. It looks at blood flow through these arteries that supply the brain with blood. Allow one hour for this exam. There are no restrictions or special instructions.  Follow-Up: Your physician recommends that you schedule a follow-up appointment in: 1-2 weeks BP check   6-8 weeks   Any Other Special Instructions Will Be Listed Below (If Applicable).  If you need a refill on your cardiac medications before your next appointment, please call your pharmacy.  (Patient Name)-_____________________________  (MRN)-_________________________     (Physician)-_________________________________   (DATE) -_________ (Blood Pressure)-_________  (Heart Rate)-_________    (DATE) -_________ (Blood Pressure)-_________  (Heart Rate)-_________    (DATE) -_________ (Blood Pressure)-_________  (Heart Rate)-_________   (DATE) -_________ (Blood Pressure)-_________  (Heart Rate)-_________    (DATE) -_________ (Blood Pressure)-_________  (Heart Rate)-_________    (DATE) -_________ (Blood Pressure)-_________  (Heart Rate)-_________   (DATE) -_________ (Blood Pressure)-_________  (Heart Rate)-_________    (DATE) -_________ (Blood Pressure)-_________  (Heart Rate)-_________    (DATE) -_________ (Blood Pressure)-_________  (Heart Rate)-_________    (DATE) -_________ (Blood Pressure)-_________  (Heart Rate)-_________    (DATE) -_________ (Blood Pressure)-_________  (Heart Rate)-_________    (DATE) -_________ (Blood Pressure)-_________  (Heart Rate)-_________    (DATE) -_________ (Blood Pressure)-_________  (Heart Rate)-_________    (DATE) -_________ (Blood  Pressure)-_________  (Heart Rate)-_________

## 2024-06-24 ENCOUNTER — Other Ambulatory Visit: Payer: Self-pay | Admitting: Cardiology

## 2024-06-24 DIAGNOSIS — I4819 Other persistent atrial fibrillation: Secondary | ICD-10-CM

## 2024-06-26 NOTE — Telephone Encounter (Signed)
 Prescription refill request for Eliquis  received. Indication: AF Last office visit: 06/16/24  FORBES Crate NP Scr: 1.64 on 05/11/24  Epic Age: 84 Weight: 86.2kg  Based on above findings Eliquis  2.5mg  twice daily is the appropriate dose.  Pt is taking 5mg  twice daily.  Please advise on dose reduction.

## 2024-06-28 ENCOUNTER — Ambulatory Visit: Payer: Medicare HMO

## 2024-06-28 DIAGNOSIS — I441 Atrioventricular block, second degree: Secondary | ICD-10-CM

## 2024-06-29 ENCOUNTER — Ambulatory Visit

## 2024-06-29 ENCOUNTER — Telehealth: Payer: Self-pay | Admitting: Cardiology

## 2024-06-29 LAB — CUP PACEART REMOTE DEVICE CHECK
Battery Remaining Longevity: 156 mo
Battery Remaining Percentage: 100 %
Brady Statistic RA Percent Paced: 0 %
Brady Statistic RV Percent Paced: 99 %
Date Time Interrogation Session: 20250813051700
Implantable Lead Connection Status: 753985
Implantable Lead Connection Status: 753985
Implantable Lead Implant Date: 20111121
Implantable Lead Implant Date: 20111121
Implantable Lead Location: 753859
Implantable Lead Location: 753860
Implantable Lead Model: 4469
Implantable Lead Model: 4470
Implantable Lead Serial Number: 548226
Implantable Lead Serial Number: 687643
Implantable Pulse Generator Implant Date: 20240429
Lead Channel Impedance Value: 374 Ohm
Lead Channel Setting Pacing Amplitude: 2.5 V
Lead Channel Setting Pacing Pulse Width: 0.4 ms
Lead Channel Setting Sensing Sensitivity: 3.5 mV
Pulse Gen Serial Number: 662700
Zone Setting Status: 755011

## 2024-06-29 NOTE — Telephone Encounter (Signed)
 Pt would like to reschedule her nurse visit on 8/14 due to having a headache.

## 2024-07-02 ENCOUNTER — Ambulatory Visit: Payer: Self-pay | Admitting: Cardiovascular Disease

## 2024-07-04 ENCOUNTER — Ambulatory Visit

## 2024-07-11 ENCOUNTER — Other Ambulatory Visit: Payer: Self-pay

## 2024-07-11 ENCOUNTER — Ambulatory Visit: Attending: Nurse Practitioner

## 2024-07-11 ENCOUNTER — Encounter: Payer: Self-pay | Admitting: Nurse Practitioner

## 2024-07-11 DIAGNOSIS — R42 Dizziness and giddiness: Secondary | ICD-10-CM

## 2024-07-11 DIAGNOSIS — I4819 Other persistent atrial fibrillation: Secondary | ICD-10-CM

## 2024-07-11 DIAGNOSIS — I6523 Occlusion and stenosis of bilateral carotid arteries: Secondary | ICD-10-CM | POA: Diagnosis not present

## 2024-07-11 DIAGNOSIS — I2510 Atherosclerotic heart disease of native coronary artery without angina pectoris: Secondary | ICD-10-CM | POA: Diagnosis not present

## 2024-07-11 DIAGNOSIS — R6889 Other general symptoms and signs: Secondary | ICD-10-CM

## 2024-07-12 MED ORDER — APIXABAN 2.5 MG PO TABS: 2.5000 mg | ORAL_TABLET | Freq: Two times a day (BID) | ORAL | 1 refills | Status: DC

## 2024-07-12 MED ORDER — PANTOPRAZOLE SODIUM 40 MG PO TBEC: 40.0000 mg | DELAYED_RELEASE_TABLET | Freq: Every day | ORAL | 0 refills | Status: DC

## 2024-07-12 NOTE — Telephone Encounter (Signed)
 Pt last saw Brandi Crate, NP on 06/16/24, last labs 05/11/24 Creat 1.64, age 83, weight 86.2kg, based on specified criteria pt is on appropriate dosage of Eliquis  2.5mg  BID for afib.  Will refill rx.

## 2024-07-20 ENCOUNTER — Ambulatory Visit: Admitting: Adult Health

## 2024-07-20 ENCOUNTER — Encounter: Payer: Self-pay | Admitting: Adult Health

## 2024-07-20 VITALS — BP 110/80 | HR 76 | Ht 66.0 in | Wt 182.8 lb

## 2024-07-20 DIAGNOSIS — J9611 Chronic respiratory failure with hypoxia: Secondary | ICD-10-CM | POA: Diagnosis not present

## 2024-07-20 DIAGNOSIS — G4733 Obstructive sleep apnea (adult) (pediatric): Secondary | ICD-10-CM

## 2024-07-20 DIAGNOSIS — J449 Chronic obstructive pulmonary disease, unspecified: Secondary | ICD-10-CM | POA: Diagnosis not present

## 2024-07-20 MED ORDER — BUDESONIDE-FORMOTEROL FUMARATE 160-4.5 MCG/ACT IN AERO: 2.0000 | INHALATION_SPRAY | Freq: Two times a day (BID) | RESPIRATORY_TRACT | 3 refills | Status: AC

## 2024-07-20 MED ORDER — ALBUTEROL SULFATE HFA 108 (90 BASE) MCG/ACT IN AERS: 2.0000 | INHALATION_SPRAY | RESPIRATORY_TRACT | 3 refills | Status: AC | PRN

## 2024-07-20 NOTE — Patient Instructions (Addendum)
 Continue on Symbicort  2 puffs twice daily, rinse after use Albuterol  inhaler or Duoneb as needed Continue on Oxygen  2 L Order for POC device .  Continue on CPAP at bedtime, wear all night long Flu and Covid vaccine this fall.  Follow-up in 6 months with Dr. Jude or Axton Cihlar NP -30 min slot -[facility] office

## 2024-07-20 NOTE — Progress Notes (Signed)
 @Patient  ID: Brandi Hunter, female    DOB: 02/04/1941, 83 y.o.   MRN: [id number]  Chief Complaint  Patient presents with   Follow-up    Referring provider: No ref. provider found  HPI: 83 yo female former smoker  followed for OSA, COPD with Asthma and Emphysema, O2 RF with OHS on Home O2, Pulmonary hypertension Medical history significant for coronary artery disease, A-fib, valvular heart disease. Her son was our Building services engineer. (Passed away 2024-06-04)   TEST/EVENTS : Reviewed  PFT 09/27/12 >> FEV1 1.81 (79%), FEV1% 65, TLC 4.64 (86%), DLCO 77%, no BD   Chest Imaging:  CT chest 06/04/2014 >> 5 mm nodule Rt lower lung stable since 2008 LDCT chest 06/28/20 >> atherosclerosis, mild centrilobular emphysema, scattered scarring, calcified granulomas, stable nodules up to 5.8 mm   Sleep Tests:  PSG 11/18/08 >> AHI 9 Auto CPAP 05/31/23 to 06/29/23 >> used on 30 of 30 nights with average 9 hrs 56 min.  Average AHI 0.7 with median CPAP 5 and 95 th percentile CPAP 7 cm H2O   Cardiac Tests:  Echo 05/06/20 >> EF 60 to 65%, mild LVH, grade 2 DD, mod elevation in PASP, severe LA/RA dilation, mild/mod MR  07/20/2024 Follow up ; OSA/OHS , COPD with Asthma/Emphysema, O2 RF  Discussed the use of AI scribe software for clinical note transcription with the patient, who gave verbal consent to proceed.  History of Present Illness Brandi Hunter is an 83 year old female with obstructive sleep apnea, COPD, asthma, and emphysema who presents for a six month follow-up.  She uses a CPAP machine nightly for obstructive sleep apnea, with consistent use and effective control of her condition. She uses a full face mask, and her CPAP settings remain stable. Download shows 100% compliance , average usage at 12hr, AHI 1.3/hr .   She has a history of COPD with asthma and emphysema, requiring home oxygen  at 2 liters. She uses Symbicort  inhaler twice daily, with two puffs in the morning and two in the evening. She also  uses an albuterol  inhaler and a DuoNeb nebulizer as needed.  No flare of cough or wheezing    She finds it difficult to manage the current oxygen  tanks due to their weight and her balance issues, especially when using a walker.  She is currently taking Eliquis  for atrial fibrillation  She follows up with a cardiologist regularly.  In terms of vaccinations, she received a pneumonia vaccine in 2022, an RSV vaccine last year, and confirms receiving the COVID vaccine. She has not yet received her flu vaccine this year but plans to do so later this month or next month.  She shares a personal loss, mentioning that her oldest son passed away in June 04, 2024 after a prolonged hospital stay due to fluid retention issues from CHF . He was our patient. Condolences were given.   Walk test today in office shows O2 sats 88% on room air  Patient Saturations on 2L Liters of pulsed oxygen  while Ambulating = 97%   Please briefly explain why patient needs home oxygen : Requires 2L of pulsed oxygen  to keep sats >88-90%       Allergies  Allergen Reactions   Bee Venom Swelling   Latex Swelling    LATEX CATHETERS    Immunization History  Administered Date(s) Administered   Fluad Quad(high Dose 65+) 08/20/2016, 08/25/2017, 08/29/2021, 10/15/2023   INFLUENZA, HIGH DOSE SEASONAL PF 08/06/2015, 08/20/2016, 08/25/2017, 07/20/2018, 07/21/2019, 08/25/2020   Influenza Split 08/27/2011,  08/16/2012, 09/14/2012, 08/16/2013, 09/05/2014, 09/19/2014, 08/30/2015   Influenza, Seasonal, Injecte, Preservative Fre 09/01/2012, 08/16/2013, 09/05/2014   Influenza,inj,quad, With Preservative 09/01/2012   Influenza,trivalent, recombinat, inj, PF 08/16/2012, 08/16/2013, 09/05/2014, 08/30/2015   Influenza-Unspecified 09/01/2012, 08/16/2013, 09/05/2014, 08/20/2016, 08/25/2017, 08/25/2020   Moderna SARS-COV2 Booster Vaccination 08/16/2021, 10/15/2023   Moderna Sars-Covid-2 Vaccination 12/15/2019, 01/12/2020, 10/29/2020   PNEUMOCOCCAL  CONJUGATE-20 03/06/2021   Pneumococcal Conjugate-13 11/16/2008, 12/06/2014, 06/13/2019   Pneumococcal Polysaccharide-23 11/16/2008, 11/19/2017   Tdap 09/05/2014, 03/06/2021   Zoster Recombinant(Shingrix) 06/13/2019, 08/24/2019   Zoster, Live 09/16/2012    Past Medical History:  Diagnosis Date   Asthma    CAD (coronary artery disease)    a. catheterization in 03/2018 showing 90% ostial OM stenosis with medical management recommended at that time   Chronic anticoagulation    COPD (chronic obstructive pulmonary disease) (HCC)    DJD (degenerative joint disease)    Essential hypertension    GERD (gastroesophageal reflux disease)    History of home oxygen  therapy    Hyperlipidemia    Mobitz (type) II atrioventricular block    Obesity    Pacemaker    Implanted by Dr [name] (MDT) 10/06/10   PAF (paroxysmal atrial fibrillation) (HCC)    Pancreatitis 2010 OR 2011   Sleep apnea    CPAP   Type 2 diabetes mellitus (HCC)     Tobacco History: Social History   Tobacco Use  Smoking Status Former   Current packs/day: 0.00   Types: Cigarettes   Quit date: 09/23/1963   Years since quitting: 60.8  Smokeless Tobacco Former   Quit date: 11/17/2011  Tobacco Comments   Stopped smoking in November 2018 for the last time.  12/20/2023 hfb   Counseling given: Not Answered Tobacco comments: Stopped smoking in November 2018 for the last time.  12/20/2023 hfb   Outpatient Medications Prior to Visit  Medication Sig Dispense Refill   acetaminophen  (TYLENOL ) 650 MG CR tablet Take 650 mg by mouth every 8 (eight) hours as needed for pain.     apixaban  (ELIQUIS ) 2.5 MG TABS tablet Take 1 tablet (2.5 mg total) by mouth 2 (two) times daily. 180 tablet 1   atorvastatin  (LIPITOR) 40 MG tablet Take 40 mg by mouth at bedtime.      cephALEXin  (KEFLEX ) 500 MG capsule Take 500 mg by mouth 4 (four) times daily.     Cholecalciferol (VITAMIN D3) 1.25 MG (50000 UT) CAPS Take 1 capsule by mouth once a week.      Cyanocobalamin (B-12 PO) Take by mouth.     diclofenac  Sodium (VOLTAREN ) 1 % GEL Apply 2 g topically 2 (two) times daily as needed (for pain).      diltiazem  (CARDIZEM  CD) 120 MG 24 hr capsule TAKE 1 CAPSULE EVERY DAY (Patient not taking: Reported on 06/16/2024) 90 capsule 3   DULoxetine  (CYMBALTA ) 30 MG capsule Take 30 mg by mouth at bedtime.      fenofibrate  160 MG tablet Take 160 mg by mouth every evening.     furosemide  (LASIX ) 20 MG tablet TAKE 1 TABLET EVERY DAY (Patient not taking: Reported on 06/16/2024) 90 tablet 3   gabapentin  (NEURONTIN ) 100 MG capsule Take 200 mg by mouth at bedtime.     ipratropium-albuterol  (DUONEB) 0.5-2.5 (3) MG/3ML SOLN Take 3 mLs by nebulization 4 (four) times daily. 360 mL 1   LORazepam  (ATIVAN ) 0.5 MG tablet Take 0.5-1 mg by mouth See admin instructions. Take half to one tablet (0.25 mg - 0.5 mg) by mouth twice daily as  needed for anxiety     meclizine (ANTIVERT) 25 MG tablet Take 25 mg by mouth 3 (three) times daily as needed for dizziness.     metFORMIN  (GLUCOPHAGE ) 500 MG tablet Take 500 mg by mouth daily. (Patient not taking: Reported on 06/16/2024)     metoprolol  succinate (TOPROL -XL) 50 MG 24 hr tablet Take 1 tablet (50 mg total) by mouth 2 (two) times daily. Take with or immediately following a meal. 180 tablet 1   nitroGLYCERIN  (NITROSTAT ) 0.4 MG SL tablet USE 1 TAB UNDER TONGUE EVERY 5 MINUTES AS NEEDED FOR CHEST PAIN FOR 3 DOSES. IF NO RELIEF AFTER 3RD TAB GO TO ER OR CALL 911 75 tablet 3   OXYGEN  Inhale 2 L into the lungs continuous.     pantoprazole  (PROTONIX ) 40 MG tablet Take 1 tablet (40 mg total) by mouth daily. 30 tablet 0   spironolactone  (ALDACTONE ) 25 MG tablet TAKE 1/2 TABLET ONE TIME DAILY (Patient not taking: Reported on 06/16/2024) 45 tablet 3   albuterol  (VENTOLIN  HFA) 108 (90 Base) MCG/ACT inhaler Inhale 2 puffs into the lungs every 4 (four) hours as needed for wheezing.      budesonide -formoterol  (SYMBICORT ) 160-4.5 MCG/ACT inhaler Inhale 2  puffs into the lungs 2 (two) times daily.     No facility-administered medications prior to visit.     Review of Systems:   Constitutional:   No  weight loss, night sweats,  Fevers, chills,+ fatigue, or  lassitude.  HEENT:   No headaches,  Difficulty swallowing,  Tooth/dental problems, or  Sore throat,                No sneezing, itching, ear ache, nasal congestion, post nasal drip,   CV:  No chest pain,  Orthopnea, PND, swelling in lower extremities, anasarca, dizziness, palpitations, syncope.   GI  No heartburn, indigestion, abdominal pain, nausea, vomiting, diarrhea, change in bowel habits, loss of appetite, bloody stools.   Resp: .  No chest wall deformity  Skin: no rash or lesions.  GU: no dysuria, change in color of urine, no urgency or frequency.  No flank pain, no hematuria   MS:  No joint pain or swelling.  No decreased range of motion.  No back pain.    Physical Exam  BP 110/80 (BP Location: Left Arm, Patient Position: Sitting, Cuff Size: Normal)   Pulse 76   Ht 5' 6 (1.676 m)   Wt 182 lb 12.8 oz (82.9 kg)   SpO2 98% Comment: RA  BMI 29.50 kg/m   GEN: A/Ox3; pleasant , NAD, elderly, on O2 , rolling walker    HEENT:  [name]/AT,  EACs-clear, TMs-wnl, NOSE-clear, THROAT-clear, no lesions, no postnasal drip or exudate noted.   NECK:  Supple w/ fair ROM; no JVD; normal carotid impulses w/o bruits; no thyromegaly or nodules palpated; no lymphadenopathy.    RESP  Clear  P & A; w/o, wheezes/ rales/ or rhonchi. no accessory muscle use, no dullness to percussion  CARD:  RRR, no m/r/g, no peripheral edema, pulses intact, no cyanosis or clubbing.  GI:   Soft & nt; nml bowel sounds; no organomegaly or masses detected.   Musco: Warm bil, no deformities or joint swelling noted.   Neuro: alert, no focal deficits noted.    Skin: Warm, no lesions or rashes    Lab Results:  CBC    Administration History     None           No data to display  No  results found for: NITRICOXIDE      Assessment & Plan:   No problem-specific Assessment & Plan notes found for this encounter.  Assessment and Plan Assessment & Plan Obstructive sleep apnea   Obstructive sleep apnea is well-controlled with consistent and effective use of CPAP at optimal pressure settings. She uses a full face mask effectively.  Chronic obstructive pulmonary disease with asthma and emphysema  -appears controlled  She continues on Symbicort  twice daily and uses an albuterol  inhaler and DuoNeb nebulizer for rescue. Refill Symbicort  and albuterol  inhaler prescriptions and send to [facility] for three-month supplies.  Chronic respiratory failure with hypoxia on home oxygen    Chronic respiratory failure with hypoxia is managed with home oxygen  at 2 liters. She has difficulty using current oxygen  tanks due to weight and balance issues. A request for a portable oxygen  concentrator (POC) is made due to mobility challenges.   Walk test today in office shows O2 sats 88% on room air  Patient Saturations on 2L Liters of pulsed oxygen  while Ambulating = 97%   Please briefly explain why patient needs home oxygen : Requires 2L of pulsed oxygen  to keep sats >88-90%  Plan  Patient Instructions  Continue on Symbicort  2 puffs twice daily, rinse after use Albuterol  inhaler or Duoneb as needed Continue on Oxygen  2 L Order for POC device .  Continue on CPAP at bedtime, wear all night long Flu and Covid vaccine this fall.  Follow-up in 6 months with Dr. Jude or Jayleen Scaglione NP -30 min slot -[facility] office        Madelin Stank, NP 07/20/2024

## 2024-07-27 ENCOUNTER — Ambulatory Visit: Payer: Self-pay | Admitting: Nurse Practitioner

## 2024-08-03 ENCOUNTER — Other Ambulatory Visit: Payer: Self-pay | Admitting: Cardiology

## 2024-08-07 ENCOUNTER — Ambulatory Visit: Payer: Self-pay

## 2024-08-10 NOTE — Progress Notes (Signed)
 Remote PPM Transmission

## 2024-08-11 ENCOUNTER — Ambulatory Visit: Admitting: Nurse Practitioner

## 2024-08-24 ENCOUNTER — Observation Stay (HOSPITAL_COMMUNITY)
Admission: EM | Admit: 2024-08-24 | Discharge: 2024-08-25 | Disposition: A | Discharge status: Home / Self Care | Attending: Infectious Diseases | Admitting: Infectious Diseases

## 2024-08-24 ENCOUNTER — Emergency Department (HOSPITAL_COMMUNITY)

## 2024-08-24 ENCOUNTER — Observation Stay (HOSPITAL_COMMUNITY)

## 2024-08-24 ENCOUNTER — Other Ambulatory Visit: Payer: Self-pay

## 2024-08-24 DIAGNOSIS — I21A1 Myocardial infarction type 2: Secondary | ICD-10-CM | POA: Diagnosis not present

## 2024-08-24 DIAGNOSIS — N1832 Chronic kidney disease, stage 3b: Secondary | ICD-10-CM | POA: Diagnosis not present

## 2024-08-24 DIAGNOSIS — K5792 Diverticulitis of intestine, part unspecified, without perforation or abscess without bleeding: Secondary | ICD-10-CM

## 2024-08-24 DIAGNOSIS — K7689 Other specified diseases of liver: Secondary | ICD-10-CM | POA: Diagnosis not present

## 2024-08-24 DIAGNOSIS — I5032 Chronic diastolic (congestive) heart failure: Secondary | ICD-10-CM | POA: Diagnosis not present

## 2024-08-24 DIAGNOSIS — K5730 Diverticulosis of large intestine without perforation or abscess without bleeding: Secondary | ICD-10-CM | POA: Insufficient documentation

## 2024-08-24 DIAGNOSIS — E785 Hyperlipidemia, unspecified: Secondary | ICD-10-CM | POA: Diagnosis not present

## 2024-08-24 DIAGNOSIS — E1140 Type 2 diabetes mellitus with diabetic neuropathy, unspecified: Secondary | ICD-10-CM | POA: Diagnosis not present

## 2024-08-24 DIAGNOSIS — I4891 Unspecified atrial fibrillation: Secondary | ICD-10-CM | POA: Insufficient documentation

## 2024-08-24 DIAGNOSIS — K5733 Diverticulitis of large intestine without perforation or abscess with bleeding: Secondary | ICD-10-CM

## 2024-08-24 DIAGNOSIS — I700 Atherosclerosis of aorta: Secondary | ICD-10-CM | POA: Diagnosis not present

## 2024-08-24 DIAGNOSIS — I130 Hypertensive heart and chronic kidney disease with heart failure and stage 1 through stage 4 chronic kidney disease, or unspecified chronic kidney disease: Secondary | ICD-10-CM | POA: Diagnosis not present

## 2024-08-24 DIAGNOSIS — D49512 Neoplasm of unspecified behavior of left kidney: Secondary | ICD-10-CM | POA: Diagnosis not present

## 2024-08-24 DIAGNOSIS — G4733 Obstructive sleep apnea (adult) (pediatric): Secondary | ICD-10-CM | POA: Diagnosis not present

## 2024-08-24 DIAGNOSIS — J449 Chronic obstructive pulmonary disease, unspecified: Secondary | ICD-10-CM | POA: Diagnosis not present

## 2024-08-24 DIAGNOSIS — K7460 Unspecified cirrhosis of liver: Secondary | ICD-10-CM | POA: Diagnosis not present

## 2024-08-24 DIAGNOSIS — R109 Unspecified abdominal pain: Secondary | ICD-10-CM | POA: Diagnosis present

## 2024-08-24 DIAGNOSIS — Z91040 Latex allergy status: Secondary | ICD-10-CM | POA: Diagnosis not present

## 2024-08-24 DIAGNOSIS — N289 Disorder of kidney and ureter, unspecified: Secondary | ICD-10-CM | POA: Insufficient documentation

## 2024-08-24 DIAGNOSIS — G459 Transient cerebral ischemic attack, unspecified: Principal | ICD-10-CM | POA: Insufficient documentation

## 2024-08-24 DIAGNOSIS — I2510 Atherosclerotic heart disease of native coronary artery without angina pectoris: Secondary | ICD-10-CM | POA: Insufficient documentation

## 2024-08-24 DIAGNOSIS — Z7901 Long term (current) use of anticoagulants: Secondary | ICD-10-CM | POA: Diagnosis not present

## 2024-08-24 DIAGNOSIS — Z959 Presence of cardiac and vascular implant and graft, unspecified: Secondary | ICD-10-CM | POA: Diagnosis not present

## 2024-08-24 DIAGNOSIS — I441 Atrioventricular block, second degree: Secondary | ICD-10-CM | POA: Diagnosis not present

## 2024-08-24 LAB — I-STAT CHEM 8, ED
BUN: 38 mg/dL — ABNORMAL HIGH (ref 8–23)
Calcium, Ion: 1.03 mmol/L — ABNORMAL LOW (ref 1.15–1.40)
Chloride: 96 mmol/L — ABNORMAL LOW (ref 98–111)
Creatinine, Ser: 1.5 mg/dL — ABNORMAL HIGH (ref 0.44–1.00)
Glucose, Bld: 184 mg/dL — ABNORMAL HIGH (ref 70–99)
HCT: 41 % (ref 36.0–46.0)
Hemoglobin: 13.9 g/dL (ref 12.0–15.0)
Potassium: 3.7 mmol/L (ref 3.5–5.1)
Sodium: 136 mmol/L (ref 135–145)
TCO2: 30 mmol/L (ref 22–32)

## 2024-08-24 LAB — COMPREHENSIVE METABOLIC PANEL WITH GFR
ALT: 30 U/L (ref 0–44)
AST: 48 U/L — ABNORMAL HIGH (ref 15–41)
Albumin: 2.6 g/dL — ABNORMAL LOW (ref 3.5–5.0)
Alkaline Phosphatase: 49 U/L (ref 38–126)
Anion gap: 12 (ref 5–15)
BUN: 29 mg/dL — ABNORMAL HIGH (ref 8–23)
CO2: 27 mmol/L (ref 22–32)
Calcium: 8.5 mg/dL — ABNORMAL LOW (ref 8.9–10.3)
Chloride: 96 mmol/L — ABNORMAL LOW (ref 98–111)
Creatinine, Ser: 1.39 mg/dL — ABNORMAL HIGH (ref 0.44–1.00)
GFR, Estimated: 38 mL/min — ABNORMAL LOW (ref >60)
Glucose, Bld: 183 mg/dL — ABNORMAL HIGH (ref 70–99)
Potassium: 3.7 mmol/L (ref 3.5–5.1)
Sodium: 135 mmol/L (ref 135–145)
Total Bilirubin: 1.7 mg/dL — ABNORMAL HIGH (ref 0.0–1.2)
Total Protein: 6 g/dL — ABNORMAL LOW (ref 6.5–8.1)

## 2024-08-24 LAB — CBC
HCT: 40.7 % (ref 36.0–46.0)
Hemoglobin: 12.7 g/dL (ref 12.0–15.0)
MCH: 28.4 pg (ref 26.0–34.0)
MCHC: 31.2 g/dL (ref 30.0–36.0)
MCV: 91.1 fL (ref 80.0–100.0)
Platelets: 249 K/uL (ref 150–400)
RBC: 4.47 MIL/uL (ref 3.87–5.11)
RDW: 15.4 % (ref 11.5–15.5)
WBC: 8 K/uL (ref 4.0–10.5)
nRBC: 0 % (ref 0.0–0.2)

## 2024-08-24 LAB — DIFFERENTIAL
Abs Immature Granulocytes: 0.02 K/uL (ref 0.00–0.07)
Basophils Absolute: 0 K/uL (ref 0.0–0.1)
Basophils Relative: 0 %
Eosinophils Absolute: 0.2 K/uL (ref 0.0–0.5)
Eosinophils Relative: 2 %
Immature Granulocytes: 0 %
Lymphocytes Relative: 12 %
Lymphs Abs: 0.9 K/uL (ref 0.7–4.0)
Monocytes Absolute: 1 K/uL (ref 0.1–1.0)
Monocytes Relative: 13 %
Neutro Abs: 5.8 K/uL (ref 1.7–7.7)
Neutrophils Relative %: 73 %

## 2024-08-24 LAB — PROTIME-INR
INR: 1.2 (ref 0.8–1.2)
Prothrombin Time: 16.2 s — ABNORMAL HIGH (ref 11.4–15.2)

## 2024-08-24 LAB — CBG MONITORING, ED: Glucose-Capillary: 176 mg/dL — ABNORMAL HIGH (ref 70–99)

## 2024-08-24 LAB — ETHANOL: Alcohol, Ethyl (B): <15 mg/dL (ref <15)

## 2024-08-24 LAB — GLUCOSE, CAPILLARY: Glucose-Capillary: 99 mg/dL (ref 70–99)

## 2024-08-24 LAB — TROPONIN I (HIGH SENSITIVITY)
Troponin I (High Sensitivity): 27 ng/L — ABNORMAL HIGH (ref <18)
Troponin I (High Sensitivity): 31 ng/L — ABNORMAL HIGH (ref <18)

## 2024-08-24 LAB — LIPASE, BLOOD: Lipase: 26 U/L (ref 11–51)

## 2024-08-24 LAB — HEMOGLOBIN A1C
Hgb A1c MFr Bld: 6 % — ABNORMAL HIGH (ref 4.8–5.6)
Mean Plasma Glucose: 125.5 mg/dL

## 2024-08-24 LAB — APTT: aPTT: 29 s (ref 24–36)

## 2024-08-24 MED ORDER — FLUTICASONE FUROATE-VILANTEROL 200-25 MCG/ACT IN AEPB
1.0000 | INHALATION_SPRAY | Freq: Every day | RESPIRATORY_TRACT | Status: DC
Start: 2024-08-24 — End: 2024-08-25
  Filled 2024-08-24: qty 28

## 2024-08-24 MED ORDER — ATORVASTATIN CALCIUM 40 MG PO TABS
40.0000 mg | ORAL_TABLET | Freq: Every day | ORAL | Status: DC
  Administered 2024-08-24: 40 mg via ORAL
  Filled 2024-08-24: qty 1

## 2024-08-24 MED ORDER — DULOXETINE HCL 30 MG PO CPEP
30.0000 mg | ORAL_CAPSULE | Freq: Every day | ORAL | Status: DC
  Administered 2024-08-24: 30 mg via ORAL
  Filled 2024-08-24: qty 1

## 2024-08-24 MED ORDER — ALBUTEROL SULFATE (2.5 MG/3ML) 0.083% IN NEBU
2.5000 mg | INHALATION_SOLUTION | RESPIRATORY_TRACT | Status: DC | PRN
Start: 2024-08-24 — End: 2024-08-25

## 2024-08-24 MED ORDER — APIXABAN 2.5 MG PO TABS
2.5000 mg | ORAL_TABLET | Freq: Two times a day (BID) | ORAL | Status: DC
  Administered 2024-08-24 – 2024-08-25 (×2): 2.5 mg via ORAL
  Filled 2024-08-24 (×2): qty 1

## 2024-08-24 MED ORDER — ACETAMINOPHEN 325 MG PO TABS: 650.0000 mg | ORAL_TABLET | Freq: Three times a day (TID) | ORAL | Status: DC | PRN

## 2024-08-24 MED ORDER — PANTOPRAZOLE SODIUM 40 MG PO TBEC
40.0000 mg | DELAYED_RELEASE_TABLET | Freq: Every day | ORAL | Status: DC
  Administered 2024-08-25: 40 mg via ORAL
  Filled 2024-08-24: qty 1

## 2024-08-24 MED ORDER — SENNOSIDES-DOCUSATE SODIUM 8.6-50 MG PO TABS
1.0000 | ORAL_TABLET | Freq: Once | ORAL | Status: AC
  Administered 2024-08-24: 1 via ORAL
  Filled 2024-08-24: qty 1

## 2024-08-24 MED ORDER — METOPROLOL SUCCINATE ER 25 MG PO TB24
50.0000 mg | ORAL_TABLET | Freq: Two times a day (BID) | ORAL | Status: DC
  Administered 2024-08-24 – 2024-08-25 (×2): 50 mg via ORAL
  Filled 2024-08-24 (×2): qty 2

## 2024-08-24 MED ORDER — PIPERACILLIN-TAZOBACTAM 3.375 G IVPB 30 MIN
3.3750 g | Freq: Once | INTRAVENOUS | Status: DC
  Filled 2024-08-24: qty 50

## 2024-08-24 MED ORDER — POLYETHYLENE GLYCOL 3350 17 G PO PACK
17.0000 g | PACK | Freq: Every day | ORAL | Status: DC
  Administered 2024-08-24 – 2024-08-25 (×2): 17 g via ORAL
  Filled 2024-08-24 (×2): qty 1

## 2024-08-24 MED ORDER — PIPERACILLIN-TAZOBACTAM 3.375 G IVPB: 3.3750 g | Freq: Three times a day (TID) | INTRAVENOUS | Status: DC

## 2024-08-24 MED ORDER — IOHEXOL 350 MG/ML SOLN
75.0000 mL | Freq: Once | INTRAVENOUS | Status: AC | PRN
  Administered 2024-08-24: 75 mL via INTRAVENOUS

## 2024-08-24 MED ORDER — GABAPENTIN 100 MG PO CAPS
200.0000 mg | ORAL_CAPSULE | Freq: Every day | ORAL | Status: DC
  Administered 2024-08-24: 200 mg via ORAL
  Filled 2024-08-24: qty 2

## 2024-08-24 MED ORDER — LABETALOL HCL 5 MG/ML IV SOLN
5.0000 mg | Freq: Once | INTRAVENOUS | Status: AC | PRN
  Administered 2024-08-24: 5 mg via INTRAVENOUS
  Filled 2024-08-24: qty 4

## 2024-08-24 MED ORDER — STROKE: EARLY STAGES OF RECOVERY BOOK
Freq: Once | Status: AC
  Filled 2024-08-24: qty 1

## 2024-08-24 NOTE — Code Documentation (Signed)
 Stroke Response Nurse Documentation Code Documentation  Brandi Hunter is a 83 y.o. female arriving to [facility]  via [facility] on 08/24/2024 with past medical hx of afib on eliquis , HTN, HLD. On Eliquis  (apixaban ) daily. Code stroke was activated by EMS.   Patient from home where she was LKW at 1010 and now complaining of aphasia . Per EMS, the patients caregiver arrived to work and found her on the floor and called EMS. Upon EMS arrival, patient was noted to have aphasia.  Stroke team at the bedside on patient arrival. Labs drawn and patient cleared for CT by Dr. Ula. Patient to CT with team. NIHSS 0, see documentation for details and code stroke times.  The following imaging was completed:  CT Head. Patient is not a candidate for IV Thrombolytic due to symptoms fully resolving per MD. Patient is not a candidate for IR due to no LVO noted on imaging.   Care Plan: VS/NIHSS q2hr x12hr, then q4hr; BP Goal <220/120.   Bedside handoff with ED RN Rolin.    Annabella DELENA Bame  Stroke Response RN

## 2024-08-24 NOTE — ED Notes (Signed)
 Wheeled patient to the bathroom patirnt did well patient is now back in bed on monitor with call bell in reach

## 2024-08-24 NOTE — Consult Note (Signed)
 NEUROLOGY CONSULT NOTE   Date of service: August 24, 2024 Patient Name: Brandi Hunter MRN:  [id number] DOB:  February 26, 1941 Chief Complaint: Code stroke Requesting Provider: Ula Prentice SAUNDERS, MD  History of Present Illness  Brandi Hunter is a 83 y.o. female with hx of atrial fibrillation on Eliquis  as well as hyperlipidemia and hypertension presenting to the emergency department today with concerns for aphasia. EMS was initially called for a fall and they witnessed an acute onset of aphasia lasting approximately 20 minutes.  She states she is back to normal now.  She does remember having a difficult time getting her words out.  She did take all of her medications including her Eliquis  last night.  She has not taken any of her medications this morning. Denies any focal weakness, numbness, or tingling. The patient was evaluated here on arrival and had an NIH stroke scale of 0.   LKW: At baseline, symptoms have fully resolved at this time Modified rankin score: 3-Moderate disability-requires help but walks WITHOUT assistance IV Thrombolysis: On Eliquis  EVT: LVO not suspected   NIHSS components Score: Comment  1a Level of Conscious 0[]  1[]  2[]  3[]      1b LOC Questions 0[]  1[]  2[]       1c LOC Commands 0[]  1[]  2[]       2 Best Gaze 0[]  1[]  2[]       3 Visual 0[]  1[]  2[]  3[]      4 Facial Palsy 0[]  1[]  2[]  3[]      5a Motor Arm - left 0[]  1[]  2[]  3[]  4[]  UN[]    5b Motor Arm - Right 0[]  1[]  2[]  3[]  4[]  UN[]    6a Motor Leg - Left 0[]  1[]  2[]  3[]  4[]  UN[]    6b Motor Leg - Right 0[]  1[]  2[]  3[]  4[]  UN[]    7 Limb Ataxia 0[]  1[]  2[]  UN[]      8 Sensory 0[]  1[]  2[]  UN[]      9 Best Language 0[]  1[]  2[]  3[]      10 Dysarthria 0[]  1[]  2[]  UN[]      11 Extinct. and Inattention 0[]  1[]  2[]       TOTAL: 0       ROS  Comprehensive ROS performed and pertinent positives documented in HPI   Past History   Past Medical History:  Diagnosis Date   Asthma    CAD (coronary artery disease)    a. catheterization in  03/2018 showing 90% ostial OM stenosis with medical management recommended at that time   Chronic anticoagulation    COPD (chronic obstructive pulmonary disease) (HCC)    DJD (degenerative joint disease)    Essential hypertension    GERD (gastroesophageal reflux disease)    History of home oxygen  therapy    Hyperlipidemia    Mobitz (type) II atrioventricular block    Obesity    Pacemaker    Implanted by Dr [name] (MDT) 10/06/10   PAF (paroxysmal atrial fibrillation) (HCC)    Pancreatitis 2010 OR 2011   Sleep apnea    CPAP   Type 2 diabetes mellitus (HCC)     Past Surgical History:  Procedure Laterality Date   CARDIOVERSION N/A 08/18/2019   Procedure: CARDIOVERSION;  Surgeon: Pietro Redell RAMAN, MD;  Location[facility] ENDOSCOPY;  Service: Cardiovascular;  Laterality: N/A;   CATARACT EXTRACTION W/PHACO  11/02/2011   Procedure: CATARACT EXTRACTION PHACO AND INTRAOCULAR LENS PLACEMENT (IOC);  Surgeon: Dow JULIANNA Burke;  Location: [facility] ORS;  Service: Ophthalmology;  Laterality: Right;  CDE=7.33   CATARACT EXTRACTION W/PHACO  12/07/2011   Procedure: CATARACT EXTRACTION PHACO AND INTRAOCULAR LENS PLACEMENT (IOC);  Surgeon: Dow JULIANNA Burke, MD;  Location: [facility] ORS;  Service: Ophthalmology;  Laterality: Left;  CDE 3.61   COLONOSCOPY WITH PROPOFOL  N/A 08/09/2014   Procedure: COLONOSCOPY WITH PROPOFOL ;  Surgeon: Renaye Sous, MD;  Location: [facility] ENDOSCOPY;  Service: Endoscopy;  Laterality: N/A;   CYSTOCELE REPAIR     ESOPHAGOGASTRODUODENOSCOPY (EGD) WITH PROPOFOL  N/A 08/09/2014   Procedure: ESOPHAGOGASTRODUODENOSCOPY (EGD) WITH PROPOFOL ;  Surgeon: Renaye Sous, MD;  Location: [facility] ENDOSCOPY;  Service: Endoscopy;  Laterality: N/A;   EYE SURGERY  11/01/2012   BOTH EYES CATARACTS   INSERT / REPLACE / REMOVE PACEMAKER  10/06/10   MDT  implanted by Dr [name]   KNEE ARTHROSCOPY     both   LEFT HEART CATH AND CORONARY ANGIOGRAPHY N/A 03/24/2018   Procedure: LEFT HEART CATH AND CORONARY ANGIOGRAPHY;  Surgeon:  Court Dorn PARAS, MD;  Location: [facility] INVASIVE CV LAB;  Service: Cardiovascular;  Laterality: N/A;   OVARY SURGERY     removal   PPM GENERATOR CHANGEOUT N/A 03/15/2023   Procedure: PPM GENERATOR CHANGEOUT;  Surgeon: Nancey, Eulas BRAVO, MD;  Location: [facility] INVASIVE CV LAB;  Service: Cardiovascular;  Laterality: N/A;   REPAIR RECTOCELE     SIMPLE MASTECTOMY WITH AXILLARY SENTINEL NODE BIOPSY Left 01/09/2015   Procedure: Irrigation and Drainage Abcess left Axilla;  Surgeon: Krystal Russell, MD;  Location: [facility] ORS;  Service: General;  Laterality: Left;   TONSILLECTOMY  AGE 6   TOTAL ABDOMINAL HYSTERECTOMY  1971    Family History: Family History  Problem Relation Age of Onset   Congestive Heart Failure Mother    Congestive Heart Failure Father    Osteoarthritis Sister    Prostate cancer Brother    Diabetes Brother    Anesthesia problems Neg Hx    Hypotension Neg Hx    Malignant hyperthermia Neg Hx    Pseudochol deficiency Neg Hx    Breast cancer Neg Hx     Social History  reports that she quit smoking about 60 years ago. Her smoking use included cigarettes. She quit smokeless tobacco use about 12 years ago. She reports that she does not currently use alcohol after a past usage of about 1.0 standard drink of alcohol per week. She reports that she does not use drugs.  Allergies  Allergen Reactions   Bee Venom Swelling   Latex Swelling    LATEX CATHETERS    Medications  No current facility-administered medications for this encounter.  Current Outpatient Medications:    acetaminophen  (TYLENOL ) 650 MG CR tablet, Take 650 mg by mouth every 8 (eight) hours as needed for pain., Disp: , Rfl:    albuterol  (VENTOLIN  HFA) 108 (90 Base) MCG/ACT inhaler, Inhale 2 puffs into the lungs every 4 (four) hours as needed for wheezing., Disp: 3 each, Rfl: 3   apixaban  (ELIQUIS ) 2.5 MG TABS tablet, Take 1 tablet (2.5 mg total) by mouth 2 (two) times daily., Disp: 180 tablet, Rfl: 1   atorvastatin  (LIPITOR)  40 MG tablet, Take 40 mg by mouth at bedtime. , Disp: , Rfl:    budesonide -formoterol  (SYMBICORT ) 160-4.5 MCG/ACT inhaler, Inhale 2 puffs into the lungs 2 (two) times daily., Disp: 3 each, Rfl: 3   cephALEXin  (KEFLEX ) 500 MG capsule, Take 500 mg by mouth 4 (four) times daily., Disp: , Rfl:    Cholecalciferol (VITAMIN D3) 1.25 MG (50000 UT) CAPS, Take 1 capsule by mouth once a week., Disp: , Rfl:  Cyanocobalamin (B-12 PO), Take by mouth., Disp: , Rfl:    diclofenac  Sodium (VOLTAREN ) 1 % GEL, Apply 2 g topically 2 (two) times daily as needed (for pain). , Disp: , Rfl:    [Paused] diltiazem  (CARDIZEM  CD) 120 MG 24 hr capsule, TAKE 1 CAPSULE EVERY DAY (Patient not taking: Reported on 06/16/2024), Disp: 90 capsule, Rfl: 3   DULoxetine  (CYMBALTA ) 30 MG capsule, Take 30 mg by mouth at bedtime. , Disp: , Rfl:    fenofibrate  160 MG tablet, Take 160 mg by mouth every evening., Disp: , Rfl:    [Paused] furosemide  (LASIX ) 20 MG tablet, TAKE 1 TABLET EVERY DAY (Patient not taking: Reported on 06/16/2024), Disp: 90 tablet, Rfl: 3   gabapentin  (NEURONTIN ) 100 MG capsule, Take 200 mg by mouth at bedtime., Disp: , Rfl:    ipratropium-albuterol  (DUONEB) 0.5-2.5 (3) MG/3ML SOLN, Take 3 mLs by nebulization 4 (four) times daily., Disp: 360 mL, Rfl: 1   LORazepam  (ATIVAN ) 0.5 MG tablet, Take 0.5-1 mg by mouth See admin instructions. Take half to one tablet (0.25 mg - 0.5 mg) by mouth twice daily as needed for anxiety, Disp: , Rfl:    meclizine (ANTIVERT) 25 MG tablet, Take 25 mg by mouth 3 (three) times daily as needed for dizziness., Disp: , Rfl:    [Paused] metFORMIN  (GLUCOPHAGE ) 500 MG tablet, Take 500 mg by mouth daily. (Patient not taking: Reported on 06/16/2024), Disp: , Rfl:    metoprolol  succinate (TOPROL -XL) 50 MG 24 hr tablet, Take 1 tablet (50 mg total) by mouth 2 (two) times daily. Take with or immediately following a meal., Disp: 180 tablet, Rfl: 1   nitroGLYCERIN  (NITROSTAT ) 0.4 MG SL tablet, USE 1 TAB UNDER  TONGUE EVERY 5 MINUTES AS NEEDED FOR CHEST PAIN FOR 3 DOSES. IF NO RELIEF AFTER 3RD TAB GO TO ER OR CALL 911, Disp: 75 tablet, Rfl: 3   OXYGEN , Inhale 2 L into the lungs continuous., Disp: , Rfl:    pantoprazole  (PROTONIX ) 40 MG tablet, TAKE 1 TABLET EVERY DAY, Disp: 30 tablet, Rfl: 11   [Paused] spironolactone  (ALDACTONE ) 25 MG tablet, TAKE 1/2 TABLET ONE TIME DAILY (Patient not taking: Reported on 06/16/2024), Disp: 45 tablet, Rfl: 3  Vitals   Vitals:   08/24/24 1100  Weight: 81 kg  Height: 5' 6 (1.676 m)    Body mass index is 28.82 kg/m.   Physical Exam   Constitutional: Appears well-developed and well-nourished.  Psych: Affect appropriate to situation.  Eyes: No scleral injection.  HENT: No OP obstruction.  Head: Normocephalic.  Cardiovascular: Normal rate and regular rhythm.  Respiratory: Effort normal, non-labored breathing.  GI: Soft.  No distension. There is no tenderness.  Skin: WDI.   Neurologic Examination    Neuro: Mental Status: Patient is awake, alert, oriented to person, place, month, year, and situation. Patient is able to give a clear and coherent history. No signs of aphasia or neglect Cranial Nerves: II: Visual Fields are full. Pupils are equal, round, and reactive to light.   III,IV, VI: EOMI without ptosis or diploplia.  V: Facial sensation is symmetric to temperature VII: Facial movement is symmetric resting and smiling VIII: Hearing is intact to voice X: Palate elevates symmetrically XI: Shoulder shrug is symmetric. XII: Tongue protrudes midline without atrophy or fasciculations.  Motor: Tone is normal. Bulk is normal. 5/5 strength was present in all four extremities.  Sensory: Sensation is symmetric to light touch in the arms and legs. No extinction to DSS present.  Cerebellar:  FNF and HKS are intact bilaterally        Labs/Imaging/Neurodiagnostic studies   CBC:  Recent Labs  Lab 2024/09/10 1118 09-10-24 1121  WBC 8.0  --    NEUTROABS 5.8  --   HGB 12.7 13.9  HCT 40.7 41.0  MCV 91.1  --   PLT 249  --    Basic Metabolic Panel:  Lab Results  Component Value Date   NA 136 09/10/24   K 3.7 Sep 10, 2024   CO2 29 05/11/2024   GLUCOSE 184 (H) 2024/09/10   BUN 38 (H) 2024/09/10   CREATININE 1.50 (H) September 10, 2024   CALCIUM  8.8 (L) 05/11/2024   GFRNONAA 31 (L) 05/11/2024   GFRAA 40 (L) 06/20/2020   Lipid Panel:  Lab Results  Component Value Date   LDLCALC 51 03/24/2018   HgbA1c:  Lab Results  Component Value Date   HGBA1C 6.5 (H) 05/09/2024   Urine Drug Screen: No results found for: LABOPIA, COCAINSCRNUR, LABBENZ, AMPHETMU, THCU, LABBARB  Alcohol Level No results found for: [facility] INR  Lab Results  Component Value Date   INR 2.0 (H) 10/01/2022   APTT  Lab Results  Component Value Date   APTT 33 10/01/2022   AED levels: No results found for: PHENYTOIN, ZONISAMIDE, LAMOTRIGINE, LEVETIRACETA  CT Head without contrast(Personally reviewed): 1. No acute intracranial abnormality. Stable mild for age chronic white matter changes.   ASSESSMENT   Brandi Hunter is a 82 y.o. female  hx of atrial fibrillation on Eliquis  as well as hyperlipidemia and hypertension presenting to the emergency department today with concerns for aphasia.  EMS was initially called for a fall and they witnessed an acute onset of aphasia lasting approximately 20 minutes.  The patient was evaluated here on arrival and had an NIH stroke scale of 0. Recommend admission for TIA work up.   RECOMMENDATIONS  - HgbA1c, fasting lipid panel - MRI of the brain without contrast - Echocardiogram - CTA Head and Neck for vessel imaging  - Prophylactic therapy- Resume Eliquis   - Risk factor modification - Telemetry monitoring - PT consult, OT consult, Speech consult - Stroke team to follow - Medical work up per primary team   ______________________________________________________________________    Signed, Jorene Last, NP [facility]   Attending Neurohospitalist Addendum Patient seen and examined with APP/Resident. Agree with the history and physical as documented above. Agree with the plan as documented, which I helped formulate. I have edited the note above to reflect my full findings and recommendations. I have independently reviewed the chart, obtained history, review of systems and examined the patient.I have personally reviewed pertinent head/neck/spine imaging (CT/MRI). Please feel free to call with any questions.  -- Elida Ross, MD [facility] [phone]  If 7pm- 7am, please page neurology on call as listed in AMION.

## 2024-08-24 NOTE — ED Provider Notes (Signed)
 [place] EMERGENCY DEPARTMENT AT [facility] Provider Note   CSN: [id number] Arrival date & time: 08/24/24  1116  An emergency department physician performed an initial assessment on this suspected stroke patient at 1112.  Patient presents with: Code Stroke   Brandi Hunter is a 83 y.o. female.   83 year old female with past medical history of atrial fibrillation on Eliquis  as well as hyperlipidemia and hypertension presenting to the emergency department today with concerns for aphasia.  This apparently occurred this morning around 1020.  The patient had a fall at her home and medics arrived.  They reported that when they arrived the patient was having aphasia.  The patient does remember this and was having difficulty getting her words out.  She states she is back to normal now.  Denies any focal weakness, numbness, or tingling.  The patient was evaluated here on arrival and had an NIH stroke scale of 0.  The patient states that she has had some abdominal discomfort that is generalized as well as some nausea over the past few days.  Denies any associated chest pain.        Prior to Admission medications   Medication Sig Start Date End Date Taking? Authorizing Provider  acetaminophen  (TYLENOL ) 650 MG CR tablet Take 650 mg by mouth every 8 (eight) hours as needed for pain.   Yes [provider]  albuterol  (VENTOLIN  HFA) 108 (90 Base) MCG/ACT inhaler Inhale 2 puffs into the lungs every 4 (four) hours as needed for wheezing. 07/20/24  Yes Parrett, Tammy S, NP  apixaban  (ELIQUIS ) 2.5 MG TABS tablet Take 1 tablet (2.5 mg total) by mouth 2 (two) times daily. 07/12/24  Yes Debera Jayson MATSU, MD  atorvastatin  (LIPITOR) 40 MG tablet Take 40 mg by mouth at bedtime.  03/17/13  Yes [provider]  budesonide -formoterol  (SYMBICORT ) 160-4.5 MCG/ACT inhaler Inhale 2 puffs into the lungs 2 (two) times daily. 07/20/24  Yes Parrett, Tammy S, NP  Cholecalciferol (VITAMIN D3) 1.25 MG  (50000 UT) CAPS Take 1 capsule by mouth once a week. 05/17/23  Yes [provider]  Cyanocobalamin (B-12 PO) Take 1 tablet by mouth daily.   Yes [provider]  DULoxetine  (CYMBALTA ) 30 MG capsule Take 30 mg by mouth at bedtime.  02/22/20  Yes [provider]  fenofibrate  160 MG tablet Take 160 mg by mouth every evening.   Yes [provider]  gabapentin  (NEURONTIN ) 100 MG capsule Take 200 mg by mouth at bedtime. 12/18/20  Yes [provider]  ipratropium-albuterol  (DUONEB) 0.5-2.5 (3) MG/3ML SOLN Take 3 mLs by nebulization 4 (four) times daily. 08/31/19  Yes Johnson, Clanford L, MD  LORazepam  (ATIVAN ) 0.5 MG tablet Take 0.5-1 mg by mouth See admin instructions. Take half to one tablet (0.25 mg - 0.5 mg) by mouth twice daily as needed for anxiety 09/18/22  Yes [provider]  meclizine (ANTIVERT) 25 MG tablet Take 25 mg by mouth 3 (three) times daily as needed for dizziness.   Yes [provider]  nitroGLYCERIN  (NITROSTAT ) 0.4 MG SL tablet USE 1 TAB UNDER TONGUE EVERY 5 MINUTES AS NEEDED FOR CHEST PAIN FOR 3 DOSES. IF NO RELIEF AFTER 3RD TAB GO TO ER OR CALL 911 Patient taking differently: Place 0.4 mg under the tongue every 5 (five) minutes as needed for chest pain. 08/31/23  Yes Debera Jayson MATSU, MD  pantoprazole  (PROTONIX ) 40 MG tablet TAKE 1 TABLET EVERY DAY 08/03/24  Yes Debera Jayson MATSU, MD  diltiazem  (CARDIZEM  CD) 120 MG 24 hr capsule TAKE 1 CAPSULE EVERY DAY Patient not taking: Reported on 08/24/2024 11/19/23   Debera Jayson MATSU, MD  furosemide  (LASIX ) 20 MG tablet TAKE 1 TABLET EVERY DAY Patient not taking: Reported on 06/16/2024 11/19/23   Debera Jayson MATSU, MD  metFORMIN  (GLUCOPHAGE ) 500 MG tablet Take 500 mg by mouth daily. Patient not taking: No sig reported 03/05/20   [provider]  metoprolol  succinate (TOPROL -XL) 50 MG 24 hr tablet Take 1 tablet (50 mg total) by mouth 2 (two) times daily. Take with or immediately  following a meal. Patient not taking: Reported on 08/24/2024 06/16/24 09/14/24  Miriam Norris, NP  midodrine  (PROAMATINE ) 2.5 MG tablet Take 2.5 mg by mouth 2 (two) times daily. Patient not taking: Reported on 08/24/2024 07/05/24   [provider]  OXYGEN  Inhale 2 L into the lungs continuous.    [provider]  spironolactone  (ALDACTONE ) 25 MG tablet TAKE 1/2 TABLET ONE TIME DAILY Patient not taking: Reported on 06/16/2024 11/19/23   Debera Jayson MATSU, MD    Allergies: Bee venom and Latex    Review of Systems  Neurological:  Positive for speech difficulty.  All other systems reviewed and are negative.   Updated Vital Signs BP (!) 154/77 (BP Location: Right Arm)   Pulse 70   Temp 97.9 F (36.6 C) (Oral)   Resp (!) 25   Ht 5' 6 (1.676 m)   Wt 79.7 kg   SpO2 100%   BMI 28.37 kg/m   Physical Exam Vitals and nursing note reviewed.   Gen: NAD Eyes: PERRL, EOMI HEENT: no oropharyngeal swelling Neck: trachea midline Resp: clear to auscultation bilaterally Card: RRR, no murmurs, rubs, or gallops Abd: The patient has mild diffuse tenderness with no guarding or rebound Extremities: no calf tenderness, no edema Vascular: 2+ radial pulses bilaterally, 2+ DP pulses bilaterally Neuro: NIH stroke scale of 0, no focal neurologic deficits Skin: no rashes Psyc: acting appropriately   (all labs ordered are listed, but only abnormal results are displayed) Labs Reviewed  PROTIME-INR - Abnormal; Notable for the following components:      Result Value   Prothrombin Time 16.2 (*)    All other components within normal limits  COMPREHENSIVE METABOLIC PANEL WITH GFR - Abnormal; Notable for the following components:   Chloride 96 (*)    Glucose, Bld 183 (*)    BUN 29 (*)    Creatinine, Ser 1.39 (*)    Calcium  8.5 (*)    Total Protein 6.0 (*)    Albumin 2.6 (*)    AST 48 (*)    Total Bilirubin 1.7 (*)    GFR, Estimated 38 (*)    All other components within normal limits   HEMOGLOBIN A1C - Abnormal; Notable for the following components:   Hgb A1c MFr Bld 6.0 (*)    All other components within normal limits  I-STAT CHEM 8, ED - Abnormal; Notable for the following components:   Chloride 96 (*)    BUN 38 (*)    Creatinine, Ser 1.50 (*)    Glucose, Bld 184 (*)    Calcium , Ion 1.03 (*)    All other components within normal limits  ETHANOL  APTT  CBC  DIFFERENTIAL  RAPID URINE DRUG SCREEN, HOSP PERFORMED  LIPASE, BLOOD  TROPONIN I (HIGH SENSITIVITY)  TROPONIN I (HIGH SENSITIVITY)    EKG: None  Radiology: CT ABDOMEN PELVIS W CONTRAST Result Date: 08/24/2024 CLINICAL DATA:  Acute generalized  abdominal pain. EXAM: CT ABDOMEN AND PELVIS WITH CONTRAST TECHNIQUE: Multidetector CT imaging of the abdomen and pelvis was performed using the standard protocol following bolus administration of intravenous contrast. RADIATION DOSE REDUCTION: This exam was performed according to the departmental dose-optimization program which includes automated exposure control, adjustment of the mA and/or kV according to patient size and/or use of iterative reconstruction technique. CONTRAST:  75mL OMNIPAQUE  IOHEXOL  350 MG/ML SOLN COMPARISON:  May 09, 2014. FINDINGS: Lower chest: No acute abnormality. Hepatobiliary: No cholelithiasis or biliary dilatation is noted. 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 Pancreas: Unremarkable. No pancreatic ductal dilatation or surrounding inflammatory changes. Spleen: Normal in size without focal abnormality. Adrenals/Urinary Tract: Adrenal glands appear normal. 3.5 x 2.5 cm thick walled low-density structure is seen arising from upper pole of left kidney which may represent collapse cyst, but MRI is recommended to rule out neoplasm. No hydronephrosis or renal obstruction is noted. Urinary bladder is unremarkable. Stomach/Bowel: Stomach is unremarkable. There is no evidence of bowel obstruction or inflammation. Sigmoid  diverticulosis is noted with moderate wall thickening of proximal sigmoid colon suggesting diverticulitis. Appendix is not clearly visualized. Vascular/Lymphatic: Aortic atherosclerosis. No enlarged abdominal or pelvic lymph nodes. Reproductive: Status post hysterectomy. No adnexal masses. Other: No hernia is noted. Small amount of free fluid is noted in the pelvis which may be related to probable diverticulitis. Musculoskeletal: No acute or significant osseous findings. IMPRESSION: 1. Sigmoid diverticulosis is noted with moderate wall thickening of proximal sigmoid colon suggesting diverticulitis. Small amount of free fluid is noted in the pelvis which may be related to this inflammation. 2. 3.5 x 2.5 cm thick walled low-density structure is seen arising from upper pole of left kidney which may represent collapsed cyst, but MRI is recommended to rule out neoplasm. 3. 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 4. Aortic atherosclerosis. Aortic Atherosclerosis (ICD10-I70.0). Electronically Signed   By: Lynwood Landy Raddle M.D.   On: 08/24/2024 12:54   CT ANGIO HEAD NECK W WO CM Result Date: 08/24/2024 EXAM: CTA HEAD AND NECK WITHOUT AND WITH 08/24/2024 12:13:00 PM TECHNIQUE: CTA of the head and neck was performed without and with the administration of 75 mL of intravenous iohexol  (OMNIPAQUE ) 350 MG/ML injection. Multiplanar 2D and/or 3D reformatted images are provided for review. Automated exposure control, iterative reconstruction, and/or weight based adjustment of the mA/kV was utilized to reduce the radiation dose to as low as reasonably achievable. Stenosis of the internal carotid arteries measured using NASCET criteria. COMPARISON: Head CT earlier today. Chest CT 06/28/2020. CLINICAL HISTORY: 83 year old female with code stroke presentation today. Stroke/TIA, determine embolic source. Aphasia. Abd pain. FINDINGS: CTA NECK: AORTIC ARCH AND ARCH VESSELS: 3 vessel aortic arch with mild to  moderate aortic atherosclerosis. No significant stenosis of the brachiocephalic or subclavian arteries. CERVICAL CAROTID ARTERIES: Brachiocephalic artery plaque without stenosis. No right CCA plaque. Minimal plaque of the right carotid bifurcation. Tortuous right ICA in the neck. Right ICA siphon mild calcified plaque without stenosis. Minimal left CCA plaque and no stenosis. Calcified plaque at the left ICA origin is soft and calcified, mild to moderate but does not result in stenosis. Up to moderate left ICA siphon cavernous segment calcified plaque, no significant left siphon stenosis. CERVICAL VERTEBRAL ARTERIES: Proximal subclavian artery atherosclerosis without stenosis. Left vertebral artery origin is normal. Calcified plaque at the right vertebral artery origin without stenosis. Mildly dominant left vertebral artery. Both cervical vertebral arteries are tortuous, minimal vertebral artery atherosclerosis in the neck.  No vertebral artery stenosis to the vertebrobasilar junction. LUNGS AND MEDIASTINUM: Right chest cardiac pacemaker. Negative upper lungs. SOFT TISSUES: Venous contrast reflux in the right IJ and a right sided thyroid  vein incidentally noted. No acute abnormality. BONES: Upper thoracic T2 and T3 superior endplate compression fractures are new since 2021, but appear sclerotic and are probably nonacute. Superimposed cervical spine degeneration. No suspicious osseous lesion. CTA HEAD: ANTERIOR CIRCULATION: No significant stenosis of the internal carotid arteries. Normal ACA origins. Diminutive or absent anterior communicating artery. Bilateral ACA branches are within normal limits. MCA M1 segments and bifurcations are patent without stenosis. No aneurysm. POSTERIOR CIRCULATION: No significant stenosis of the vertebral arteries. Patent basilar artery without stenosis. Normal basilar tip, SCA and PCA origins. Diminutive or absent posterior communicating arteries. Bilateral PCA branches are within  normal limits. There is mild to moderate irregularity and stenosis of the right PCA P3 branches (series 15 image 16) with no discrete PCA branch occlusion. No aneurysm. OTHER: IMPRESSION: 1. No large vessel occlusion or significant large vessel stenosis in the head or neck. 2. Mild to moderate irregularity of distal MCA and PCA branches without discrete branch occlusion. 3. These results were communicated to Dr. Matthews at 12:43 on 08/24/2024 by text page via the [facility] messaging system. Electronically signed by: Helayne Hurst MD 08/24/2024 12:45 PM EDT RP Workstation: [id number]   CT HEAD CODE STROKE WO CONTRAST Result Date: 08/24/2024 EXAM: CT HEAD WITHOUT CONTRAST 08/24/2024 11:20:28 AM TECHNIQUE: CT of the head was performed without the administration of intravenous contrast. Automated exposure control, iterative reconstruction, and/or weight based adjustment of the mA/kV was utilized to reduce the radiation dose to as low as reasonably achievable. COMPARISON: CT 01/24/2023. CLINICAL HISTORY: 83 year old female. Acute neuro deficit, stroke suspected. Code stroke. LKW 10:10 AM, aphasia. FINDINGS: BRAIN AND VENTRICLES: No acute hemorrhage. No evidence of acute infarct. No hydrocephalus. No extra-axial collection. No mass effect or midline shift. Cerebral volume remains normal for age. Gray white differentiation appears stable and largely normal for age. Patchy and asymmetric mild left hemisphere deep white matter hypodensity appears stable. Calcified atherosclerosis at the skull base. No suspicious intracranial vascular hyperdensity. ORBITS: No acute abnormality. SINUSES: No acute abnormality. SOFT TISSUES AND SKULL: No acute soft tissue abnormality. No skull fracture. [place] stroke program early CT score (aspects) ----- Ganglionic (caudate, ic, Lentiform Nucleus, insula, M1-m3): 7 Supraganglionic (m4-m6): 3 Total: 10 IMPRESSION: 1. No acute intracranial abnormality. Stable mild for age chronic white matter changes.  ASPECTS: 10. These results were communicated to Dr. Matthews at 11:27 on 08/24/2024 by text page via the [facility] messaging system. Electronically signed by: Helayne Hurst MD 08/24/2024 11:28 AM EDT RP Workstation: [id number]     Procedures   Medications Ordered in the ED   stroke: early stages of recovery book (has no administration in time range)  iohexol  (OMNIPAQUE ) 350 MG/ML injection 75 mL (75 mLs Intravenous Contrast Given 08/24/24 1213)                                    Medical Decision Making 83 year old female with past medical history of atrial fibrillation hyperlipidemia presenting to the emergency department today with aphasia that has since resolved.  I discussed her case with the stroke team.  They do recommend admission for full TIA workup given her age and risk factors.  Patient is reporting some abdominal discomfort and nausea as well.  Will obtain EKG and  troponin as well as LFTs and lipase to evaluate for cardiac etiology or intra-abdominal pathology.  Will also obtain a CT scan of her abdomen given her age.  Plan is for admission after above workup.  The patient's labs here are reassuring.  CT scan of her abdomen does show diverticulitis.  The patient is covered with Zosyn.  A call was placed to hospitalist service for admission.  CRITICAL CARE Performed by: Prentice JONELLE Medicus   Total critical care time: 30 minutes  Critical care time was exclusive of separately billable procedures and treating other patients.  Critical care was necessary to treat or prevent imminent or life-threatening deterioration.  Critical care was time spent personally by me on the following activities: development of treatment plan with patient and/or surrogate as well as nursing, discussions with consultants, evaluation of patient's response to treatment, examination of patient, obtaining history from patient or surrogate, ordering and performing treatments and interventions, ordering and review of  laboratory studies, ordering and review of radiographic studies, pulse oximetry and re-evaluation of patient's condition.   Amount and/or Complexity of Data Reviewed Radiology: ordered.  Risk Prescription drug management. Decision regarding hospitalization.        Final diagnoses:  TIA (transient ischemic attack)  Diverticulitis    ED Discharge Orders     None          [facility] Prentice JONELLE, MD 08/24/24 1308

## 2024-08-24 NOTE — ED Triage Notes (Signed)
 Ems reports they were called out because pts caregiver arrived to work with her and found her on the floor. EMS arrived and noted Aphasia. Pt on Eliquis  last taken last night nothing taken today. EMS EMS was called around 10:08 am. Vitals Bp 153/83 P 70  O2 sats 97% on 2 liters. Pt is on baseline of 2  liters. Bilateral 18g IVs to Acs.

## 2024-08-24 NOTE — CV Procedure (Signed)
  Device system confirmed to be MRI conditional, with implant date > 6 weeks ago, and no evidence of abandoned or epicardial leads in review of most recent CXR  Device last cleared by EP Provider: Prentice Passey 08/24/24  Clearance is good through for 1 year as long as parameters remain stable at time of check. If pt undergoes a cardiac device procedure during that time, they should be re-cleared.   Tachy-therapies to be programmed off if applicable with device back to pre-MRI settings after completion of exam.  [employer] was available remotely to assist in programming recommendations.   Emogene Bouchard, RT  08/24/2024 8:15 PM

## 2024-08-24 NOTE — H&P (Signed)
 Date: 08/24/2024               Patient Name:  Brandi Hunter MRN: [id number]  DOB: 19-May-1941 Age / Sex: 83 y.o., female   PCP: Medicine, [facility] (Inactive)         Medical Service: Internal Medicine Teaching Service         Attending Physician: Dr. Reyes Fenton      First Contact: Lamonte Penning, DO}    Second Contact: Dr. [place] Lobstein, DO         Pager Information: First Contact Pager: [phone]   Second Contact Pager: [phone]   SUBJECTIVE   Chief Complaint: Word finding difficulty and abdominal pain  History of Present Illness: Brandi Hunter is a 83 y.o. 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 Patient mentions that when she woke up this morning at 10 AM for in-home physical therapy appointment, she fell from her walker. She states she was able to get up on her own and let PT in. She denies hitting her head and LOC. She states that while talking to PT, she started having difficult forming words and speaking for about 20 minutes. She mentions that she could understand what the PT was saying but was unable to get words out. In addition to this, patient mentions she has had abdominal pain and nausea since 10/4, which has progressively gotten worse. Denies vomiting. Endorses constipation. Given these symptoms, she was advised by her PT to come to the ER.   On arrival to the ED, patient was worked up for stroke and her abdominal pain. CT head was unremarkable for any acute intracranial abnormality. CT Angio Head and Neck was unremarkable for a large vessel occlusion or significant large vessel stenosis but was remarkable for mild to moderate irregularity of distal MCA and PCA branches without discrete branch  occlusion. Echocardiogram was ordered and read is pending. For her abdominal pain, CT abdomen pelvis was ordered and was remarkable for sigmoid diverticulosis with moderate wall thickening of proximal sigmoid colon suggesting diverticulitis with some free fluid in the pelvis, a 3.5 x 2.5 cm structure seen arising in left upper pole of left kidney which may represent collapsed cyst, nodular hepatic margins consistent with hepatic cirrhosis and aortic atherosclerosis. CMP was remarkable for uptrending creatinine (1.39 -> 1.50), hypoalbuminemia (2.6), elevated AST (48) and a GFR of 38 which is her baseline. CBC with diff unremarkable. Troponin was 27.   ED Course: On examination in the ED, patient was on her side and anxious. She mentions she is back to her baseline with her speech. Denies headaches, changes in vision, and confusion. Mentions she is having some right arm pain after falling from walker. Neurology was consulted and recommended imaging that was completed, resuming home eliquis , PT/OT/Speech consults and A1c and Lipid panel. With her abdominal pain, she mentions she does not have any pain at the moment. Denies nausea. No fevers or chills. States abdominal pain was resolved until abdominal exam in the ED during which the pain returned with palpation. However, on examination, pain denies abdominal pain with palpation. She mentions she has been having issues with eating as she can feel food getting stuck in her esophagus. Has  not taken her morning medications.  Patient endorses a 61 pack year history, although this is with conservative estimate of one pack per day. Endorses 1-2 wine bottles per week. Denies any illicit substances. Patient lives alone and uses a walker for ambulation. States she was able to complete her ADLs prior to this week after feeling unwell. Son [place] at bedside, works maintenance at [employer]. Patient to be admitted to evaluation and treatment of TIA and  Diverticulitis.   Current Meds  Medication Sig   acetaminophen  (TYLENOL ) 650 MG CR tablet Take 650 mg by mouth every 8 (eight) hours as needed for pain.   albuterol  (VENTOLIN  HFA) 108 (90 Base) MCG/ACT inhaler Inhale 2 puffs into the lungs every 4 (four) hours as needed for wheezing.   apixaban  (ELIQUIS ) 2.5 MG TABS tablet Take 1 tablet (2.5 mg total) by mouth 2 (two) times daily.   atorvastatin  (LIPITOR) 40 MG tablet Take 40 mg by mouth at bedtime.    budesonide -formoterol  (SYMBICORT ) 160-4.5 MCG/ACT inhaler Inhale 2 puffs into the lungs 2 (two) times daily.   Cholecalciferol (VITAMIN D3) 1.25 MG (50000 UT) CAPS Take 1 capsule by mouth once a week.   Cyanocobalamin (B-12 PO) Take 1 tablet by mouth daily.   DULoxetine  (CYMBALTA ) 30 MG capsule Take 30 mg by mouth at bedtime.    fenofibrate  160 MG tablet Take 160 mg by mouth every evening.   gabapentin  (NEURONTIN ) 100 MG capsule Take 200 mg by mouth at bedtime.   ipratropium-albuterol  (DUONEB) 0.5-2.5 (3) MG/3ML SOLN Take 3 mLs by nebulization 4 (four) times daily.   LORazepam  (ATIVAN ) 0.5 MG tablet Take 0.5-1 mg by mouth See admin instructions. Take half to one tablet (0.25 mg - 0.5 mg) by mouth twice daily as needed for anxiety   meclizine (ANTIVERT) 25 MG tablet Take 25 mg by mouth 3 (three) times daily as needed for dizziness.   pantoprazole  (PROTONIX ) 40 MG tablet TAKE 1 TABLET EVERY DAY   [DISCONTINUED] nitroGLYCERIN  (NITROSTAT ) 0.4 MG SL tablet USE 1 TAB UNDER TONGUE EVERY 5 MINUTES AS NEEDED FOR CHEST PAIN FOR 3 DOSES. IF NO RELIEF AFTER 3RD TAB GO TO ER OR CALL 911 (Patient taking differently: Place 0.4 mg under the tongue every 5 (five) minutes as needed for chest pain.)    Past Medical History Past Medical History:  Diagnosis Date   Asthma    CAD (coronary artery disease)    a. catheterization in 03/2018 showing 90% ostial OM stenosis with medical management recommended at that time   Chronic anticoagulation    COPD (chronic  obstructive pulmonary disease) (HCC)    DJD (degenerative joint disease)    Essential hypertension    GERD (gastroesophageal reflux disease)    History of home oxygen  therapy    Hyperlipidemia    Mobitz (type) II atrioventricular block    Obesity    Pacemaker    Implanted by Dr [name] (MDT) 10/06/10   PAF (paroxysmal atrial fibrillation) (HCC)    Pancreatitis 2010 OR 2011   Sleep apnea    CPAP   Type 2 diabetes mellitus (HCC)      Past Surgical History Past Surgical History:  Procedure Laterality Date   CARDIOVERSION N/A 08/18/2019   Procedure: CARDIOVERSION;  Surgeon: Pietro Redell RAMAN, MD;  Location: [facility] ENDOSCOPY;  Service: Cardiovascular;  Laterality: N/A;   CATARACT EXTRACTION W/PHACO  11/02/2011   Procedure: CATARACT EXTRACTION PHACO AND INTRAOCULAR LENS PLACEMENT (IOC);  Surgeon: Dow JULIANNA Burke;  Location: [facility] ORS;  Service: Ophthalmology;  Laterality: Right;  CDE=7.33   CATARACT EXTRACTION W/PHACO  12/07/2011   Procedure: CATARACT EXTRACTION PHACO AND INTRAOCULAR LENS PLACEMENT (IOC);  Surgeon: Dow JULIANNA Burke, MD;  Location: [facility] ORS;  Service: Ophthalmology;  Laterality: Left;  CDE 3.61   COLONOSCOPY WITH PROPOFOL  N/A 08/09/2014   Procedure: COLONOSCOPY WITH PROPOFOL ;  Surgeon: Renaye Sous, MD;  Location: [facility] ENDOSCOPY;  Service: Endoscopy;  Laterality: N/A;   CYSTOCELE REPAIR     ESOPHAGOGASTRODUODENOSCOPY (EGD) WITH PROPOFOL  N/A 08/09/2014   Procedure: ESOPHAGOGASTRODUODENOSCOPY (EGD) WITH PROPOFOL ;  Surgeon: Renaye Sous, MD;  Location: [facility] ENDOSCOPY;  Service: Endoscopy;  Laterality: N/A;   EYE SURGERY  11/01/2012   BOTH EYES CATARACTS   INSERT / REPLACE / REMOVE PACEMAKER  10/06/10   MDT  implanted by Dr [name]   KNEE ARTHROSCOPY     both   LEFT HEART CATH AND CORONARY ANGIOGRAPHY N/A 03/24/2018   Procedure: LEFT HEART CATH AND CORONARY ANGIOGRAPHY;  Surgeon: Court Dorn PARAS, MD;  Location: [facility] INVASIVE CV LAB;  Service: Cardiovascular;  Laterality: N/A;   OVARY SURGERY      removal   PPM GENERATOR CHANGEOUT N/A 03/15/2023   Procedure: PPM GENERATOR CHANGEOUT;  Surgeon: Nancey, Eulas BRAVO, MD;  Location: [facility] INVASIVE CV LAB;  Service: Cardiovascular;  Laterality: N/A;   REPAIR RECTOCELE     SIMPLE MASTECTOMY WITH AXILLARY SENTINEL NODE BIOPSY Left 01/09/2015   Procedure: Irrigation and Drainage Abcess left Axilla;  Surgeon: Krystal Russell, MD;  Location: [facility] ORS;  Service: General;  Laterality: Left;   TONSILLECTOMY  AGE 48   TOTAL ABDOMINAL HYSTERECTOMY  1971     Social:  Lives With: Herself 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 Level of Function: Uses walker, states she can complete ADLs prior to this admission PCP: Efrain Broaden, AGNP Substances: -Tobacco: 61 pack year smoking history -Alcohol: 1-2 wine bottles per week -Recreational Drug: None  Family History:  Family History  Problem Relation Age of Onset   Congestive Heart Failure Mother    Congestive Heart Failure Father    Osteoarthritis Sister    Prostate cancer Brother    Diabetes Brother    Anesthesia problems Neg Hx    Hypotension Neg Hx    Malignant hyperthermia Neg Hx    Pseudochol deficiency Neg Hx    Breast cancer Neg Hx      Allergies: Allergies as of 08/24/2024 - Review Complete 08/24/2024  Allergen Reaction Noted   Bee venom Swelling 03/29/2013   Latex Swelling 05/25/2011    Review of Systems: A complete ROS was negative except as per HPI.   OBJECTIVE:   Physical Exam: Blood pressure (!) 213/79, pulse 70, temperature 97.9 F (36.6 C), temperature source Oral, resp. rate 20, height 5' 6 (1.676 m), weight 79.7 kg, SpO2 99%.  Physical Exam Eyes:     Extraocular Movements: Extraocular movements intact.     Conjunctiva/sclera: Conjunctivae normal.  Cardiovascular:     Rate and Rhythm: Normal rate and regular rhythm.     Pulses: Normal pulses.     Heart sounds: Normal heart sounds.  Pulmonary:     Effort: Pulmonary effort is normal.     Breath sounds:  Normal breath sounds.  Abdominal:     General: Abdomen is flat.     Tenderness: There is no abdominal tenderness. There is no guarding.  Musculoskeletal:     Cervical back: Normal range of motion.     Right lower leg: No edema.     Left  lower leg: No edema.     Comments: Tenderness around right shoulder  Neurological:     General: No focal deficit present.     Mental Status: She is alert and oriented to person, place, and time.  Psychiatric:        Mood and Affect: Mood normal.        Thought Content: Thought content normal.      Labs: CBC    Component Value Date/Time   WBC 8.0 08/24/2024 1118   RBC 4.47 08/24/2024 1118   HGB 13.9 08/24/2024 1121   HGB 11.6 03/09/2023 1050   HCT 41.0 08/24/2024 1121   HCT 36.3 03/09/2023 1050   PLT 249 08/24/2024 1118   PLT 301 03/09/2023 1050   MCV 91.1 08/24/2024 1118   MCV 94 03/09/2023 1050   MCH 28.4 08/24/2024 1118   MCHC 31.2 08/24/2024 1118   RDW 15.4 08/24/2024 1118   RDW 13.0 03/09/2023 1050   LYMPHSABS 0.9 08/24/2024 1118   LYMPHSABS 1.8 08/12/2018 1114   MONOABS 1.0 08/24/2024 1118   EOSABS 0.2 08/24/2024 1118   EOSABS 0.7 (H) 08/12/2018 1114   BASOSABS 0.0 08/24/2024 1118   BASOSABS 0.0 08/12/2018 1114     CMP     Component Value Date/Time   NA 136 08/24/2024 1121   NA 141 03/09/2023 1050   K 3.7 08/24/2024 1121   CL 96 (L) 08/24/2024 1121   CO2 27 08/24/2024 1118   GLUCOSE 184 (H) 08/24/2024 1121   BUN 38 (H) 08/24/2024 1121   BUN 31 (H) 03/09/2023 1050   CREATININE 1.50 (H) 08/24/2024 1121   CALCIUM  8.5 (L) 08/24/2024 1118   PROT 6.0 (L) 08/24/2024 1118   PROT 6.1 05/03/2017 1100   ALBUMIN 2.6 (L) 08/24/2024 1118   ALBUMIN 4.1 05/03/2017 1100   AST 48 (H) 08/24/2024 1118   ALT 30 08/24/2024 1118   ALKPHOS 49 08/24/2024 1118   BILITOT 1.7 (H) 08/24/2024 1118   BILITOT 0.5 05/03/2017 1100   GFRNONAA 38 (L) 08/24/2024 1118   GFRAA 40 (L) 06/20/2020 1246    Imaging: CT ABDOMEN PELVIS W  CONTRAST Result Date: 08/24/2024 CLINICAL DATA:  Acute generalized abdominal pain. EXAM: CT ABDOMEN AND PELVIS WITH CONTRAST TECHNIQUE: Multidetector CT imaging of the abdomen and pelvis was performed using the standard protocol following bolus administration of intravenous contrast. RADIATION DOSE REDUCTION: This exam was performed according to the departmental dose-optimization program which includes automated exposure control, adjustment of the mA and/or kV according to patient size and/or use of iterative reconstruction technique. CONTRAST:  75mL OMNIPAQUE  IOHEXOL  350 MG/ML SOLN COMPARISON:  May 09, 2014. FINDINGS: Lower chest: No acute abnormality. Hepatobiliary: No cholelithiasis or biliary dilatation is noted. 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 Pancreas: Unremarkable. No pancreatic ductal dilatation or surrounding inflammatory changes. Spleen: Normal in size without focal abnormality. Adrenals/Urinary Tract: Adrenal glands appear normal. 3.5 x 2.5 cm thick walled low-density structure is seen arising from upper pole of left kidney which may represent collapse cyst, but MRI is recommended to rule out neoplasm. No hydronephrosis or renal obstruction is noted. Urinary bladder is unremarkable. Stomach/Bowel: Stomach is unremarkable. There is no evidence of bowel obstruction or inflammation. Sigmoid diverticulosis is noted with moderate wall thickening of proximal sigmoid colon suggesting diverticulitis. Appendix is not clearly visualized. Vascular/Lymphatic: Aortic atherosclerosis. No enlarged abdominal or pelvic lymph nodes. Reproductive: Status post hysterectomy. No adnexal masses. Other: No hernia is noted. Small amount of free fluid is noted in  the pelvis which may be related to probable diverticulitis. Musculoskeletal: No acute or significant osseous findings. IMPRESSION: 1. Sigmoid diverticulosis is noted with moderate wall thickening of proximal sigmoid colon suggesting  diverticulitis. Small amount of free fluid is noted in the pelvis which may be related to this inflammation. 2. 3.5 x 2.5 cm thick walled low-density structure is seen arising from upper pole of left kidney which may represent collapsed cyst, but MRI is recommended to rule out neoplasm. 3. 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 4. Aortic atherosclerosis. Aortic Atherosclerosis (ICD10-I70.0). Electronically Signed   By: Lynwood Landy Raddle M.D.   On: 08/24/2024 12:54   CT ANGIO HEAD NECK W WO CM Result Date: 08/24/2024 EXAM: CTA HEAD AND NECK WITHOUT AND WITH 08/24/2024 12:13:00 PM TECHNIQUE: CTA of the head and neck was performed without and with the administration of 75 mL of intravenous iohexol  (OMNIPAQUE ) 350 MG/ML injection. Multiplanar 2D and/or 3D reformatted images are provided for review. Automated exposure control, iterative reconstruction, and/or weight based adjustment of the mA/kV was utilized to reduce the radiation dose to as low as reasonably achievable. Stenosis of the internal carotid arteries measured using NASCET criteria. COMPARISON: Head CT earlier today. Chest CT 06/28/2020. CLINICAL HISTORY: 83 year old female with code stroke presentation today. Stroke/TIA, determine embolic source. Aphasia. Abd pain. FINDINGS: CTA NECK: AORTIC ARCH AND ARCH VESSELS: 3 vessel aortic arch with mild to moderate aortic atherosclerosis. No significant stenosis of the brachiocephalic or subclavian arteries. CERVICAL CAROTID ARTERIES: Brachiocephalic artery plaque without stenosis. No right CCA plaque. Minimal plaque of the right carotid bifurcation. Tortuous right ICA in the neck. Right ICA siphon mild calcified plaque without stenosis. Minimal left CCA plaque and no stenosis. Calcified plaque at the left ICA origin is soft and calcified, mild to moderate but does not result in stenosis. Up to moderate left ICA siphon cavernous segment calcified plaque, no significant left siphon  stenosis. CERVICAL VERTEBRAL ARTERIES: Proximal subclavian artery atherosclerosis without stenosis. Left vertebral artery origin is normal. Calcified plaque at the right vertebral artery origin without stenosis. Mildly dominant left vertebral artery. Both cervical vertebral arteries are tortuous, minimal vertebral artery atherosclerosis in the neck. No vertebral artery stenosis to the vertebrobasilar junction. LUNGS AND MEDIASTINUM: Right chest cardiac pacemaker. Negative upper lungs. SOFT TISSUES: Venous contrast reflux in the right IJ and a right sided thyroid  vein incidentally noted. No acute abnormality. BONES: Upper thoracic T2 and T3 superior endplate compression fractures are new since 2021, but appear sclerotic and are probably nonacute. Superimposed cervical spine degeneration. No suspicious osseous lesion. CTA HEAD: ANTERIOR CIRCULATION: No significant stenosis of the internal carotid arteries. Normal ACA origins. Diminutive or absent anterior communicating artery. Bilateral ACA branches are within normal limits. MCA M1 segments and bifurcations are patent without stenosis. No aneurysm. POSTERIOR CIRCULATION: No significant stenosis of the vertebral arteries. Patent basilar artery without stenosis. Normal basilar tip, SCA and PCA origins. Diminutive or absent posterior communicating arteries. Bilateral PCA branches are within normal limits. There is mild to moderate irregularity and stenosis of the right PCA P3 branches (series 15 image 16) with no discrete PCA branch occlusion. No aneurysm. OTHER: IMPRESSION: 1. No large vessel occlusion or significant large vessel stenosis in the head or neck. 2. Mild to moderate irregularity of distal MCA and PCA branches without discrete branch occlusion. 3. These results were communicated to Dr. Matthews at 12:43 on 08/24/2024 by text page via the [facility] messaging system. Electronically signed by: Helayne Hurst MD 08/24/2024 12:45  PM EDT RP Workstation: [id number]   CT  HEAD CODE STROKE WO CONTRAST Result Date: 08/24/2024 EXAM: CT HEAD WITHOUT CONTRAST 08/24/2024 11:20:28 AM TECHNIQUE: CT of the head was performed without the administration of intravenous contrast. Automated exposure control, iterative reconstruction, and/or weight based adjustment of the mA/kV was utilized to reduce the radiation dose to as low as reasonably achievable. COMPARISON: CT 01/24/2023. CLINICAL HISTORY: 83 year old female. Acute neuro deficit, stroke suspected. Code stroke. LKW 10:10 AM, aphasia. FINDINGS: BRAIN AND VENTRICLES: No acute hemorrhage. No evidence of acute infarct. No hydrocephalus. No extra-axial collection. No mass effect or midline shift. Cerebral volume remains normal for age. Gray white differentiation appears stable and largely normal for age. Patchy and asymmetric mild left hemisphere deep white matter hypodensity appears stable. Calcified atherosclerosis at the skull base. No suspicious intracranial vascular hyperdensity. ORBITS: No acute abnormality. SINUSES: No acute abnormality. SOFT TISSUES AND SKULL: No acute soft tissue abnormality. No skull fracture. [place] stroke program early CT score (aspects) ----- Ganglionic (caudate, ic, Lentiform Nucleus, insula, M1-m3): 7 Supraganglionic (m4-m6): 3 Total: 10 IMPRESSION: 1. No acute intracranial abnormality. Stable mild for age chronic white matter changes. ASPECTS: 10. These results were communicated to Dr. Matthews at 11:27 on 08/24/2024 by text page via the [facility] messaging system. Electronically signed by: Helayne Hurst MD 08/24/2024 11:28 AM EDT RP Workstation: [id number]     ASSESSMENT & PLAN:   Assessment & Plan by Problem: Principal Problem:   Transient ischemic attack (TIA)   Brandi Hunter is a 83 y.o. 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  TIA CT head was unremarkable for any acute intracranial abnormality. CT Angio Head and Neck was unremarkable for a large vessel occlusion or significant large vessel stenosis but was remarkable for mild to moderate irregularity of distal MCA and PCA branches without discrete branch occlusion. Brought in for expressive aphasia, now resolved and at baseline per patient.  -Neuro consulted and following -A1C: 6.0, Lipid panel pending -PT/OT and Speech consulted -Frequent neuro checks -Echo pending  -MR Brain pending   Diverticulitis Patient with worsening abdominal pain and nausea since 10/4. Denies vomiting. CT abdomen pelvis was ordered and was remarkable for sigmoid diverticulosis with moderate wall thickening of proximal sigmoid colon suggesting diverticulitis with some free fluid in the pelvis. Lipase within normal limits. No abdominal pain on exam. Clinically stable. -As patient does not have a fever and white count is within normal range, will defer antibiotics for now. If patient spikes a fever or has an increase in WBC count, can consider Augmentin -Clear liquid diet for bowel rest. 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 Per most recent EKG, PR interval 50ms. Clinically and stable. -Resume home Toprol  50 mg BID for rate control -Resume home Eliquis  per neuro recs   Hypertension Currently hypertensive, most recent BP 213/79.  -Will resume home Toprol  50 mg BID  Hyperlipidemia CAD Most recent lipid panel from 2019 (Trig: 194, HDL: 35, LDL: 51). -Lipid panel ordered -Resume home Atorvastatin  40 mg  COPD on 2L [place] OSA Patient is saturating well (SpO2 100%) on home 2L [place]. No concerns for  exacerbation since patient denies cough or increased work of breathing.  -CPAP at night -Resume  home albuterol  and Breo-Ellipta  T2DM  Diabetic Neuropathy Glucose stable in the 180s. Hemoglobin A1C: 6.0.  -CBG daily -Resume home gabapentin  200mg  nightly -Resume home duloxetine  30 mg nightly  HFpEF Most recent echocardiogram in 04/2020 with EF 60-65%, no regional wall motion abnormalities, mild LVH with grade II diastolic dysfunction, severely left and right atrial dilation, mild to moderate mitral valve regurgitation, and normal RV function.  -Repeat echo pending -Currently euvolemic and stable, will continue to monitor clinically   CDK Stage 3b Admission labs with Cr 1.50 (GFR 38) which is at her baseline. Clinically stable, will continue to monitor - Repeat BMP in AM  Demand Ischemia Initial troponin elevated at 27, now 31. Given patient elevated blood pressures, elevated troponin likely secondary to demand ischemia. Patient denies chest pain. -Trend troponin -Patient clinically stable, will continue to monitor    Best practice: Diet: Clear liquids VTE: Eliquis  IVF: None Code: DNR/DNI  Disposition planning: Prior to Admission Living Arrangement: Home, living alone Anticipated Discharge Location: Home  Dispo: Admit patient to Observation with expected length of stay less than 2 midnights.  Signed: Myles Headland, Medical Student MS4 08/24/2024, 5:39 PM

## 2024-08-25 ENCOUNTER — Observation Stay (HOSPITAL_COMMUNITY)

## 2024-08-25 ENCOUNTER — Telehealth (HOSPITAL_COMMUNITY): Payer: Self-pay | Admitting: Pharmacy Technician

## 2024-08-25 ENCOUNTER — Other Ambulatory Visit (HOSPITAL_COMMUNITY): Payer: Self-pay

## 2024-08-25 DIAGNOSIS — I4891 Unspecified atrial fibrillation: Secondary | ICD-10-CM | POA: Diagnosis not present

## 2024-08-25 DIAGNOSIS — K7460 Unspecified cirrhosis of liver: Secondary | ICD-10-CM | POA: Diagnosis not present

## 2024-08-25 DIAGNOSIS — D49512 Neoplasm of unspecified behavior of left kidney: Secondary | ICD-10-CM | POA: Diagnosis not present

## 2024-08-25 DIAGNOSIS — E1151 Type 2 diabetes mellitus with diabetic peripheral angiopathy without gangrene: Secondary | ICD-10-CM | POA: Diagnosis not present

## 2024-08-25 DIAGNOSIS — K5733 Diverticulitis of large intestine without perforation or abscess with bleeding: Secondary | ICD-10-CM | POA: Diagnosis not present

## 2024-08-25 DIAGNOSIS — G459 Transient cerebral ischemic attack, unspecified: Secondary | ICD-10-CM

## 2024-08-25 DIAGNOSIS — R296 Repeated falls: Secondary | ICD-10-CM

## 2024-08-25 DIAGNOSIS — Z7901 Long term (current) use of anticoagulants: Secondary | ICD-10-CM | POA: Diagnosis not present

## 2024-08-25 LAB — BASIC METABOLIC PANEL WITH GFR
Anion gap: 15 (ref 5–15)
BUN: 23 mg/dL (ref 8–23)
CO2: 26 mmol/L (ref 22–32)
Calcium: 8.3 mg/dL — ABNORMAL LOW (ref 8.9–10.3)
Chloride: 96 mmol/L — ABNORMAL LOW (ref 98–111)
Creatinine, Ser: 1.18 mg/dL — ABNORMAL HIGH (ref 0.44–1.00)
GFR, Estimated: 46 mL/min — ABNORMAL LOW (ref >60)
Glucose, Bld: 120 mg/dL — ABNORMAL HIGH (ref 70–99)
Potassium: 3.6 mmol/L (ref 3.5–5.1)
Sodium: 137 mmol/L (ref 135–145)

## 2024-08-25 LAB — LIPID PANEL
Cholesterol: 96 mg/dL (ref 0–200)
HDL: 26 mg/dL — ABNORMAL LOW (ref >40)
LDL Cholesterol: 53 mg/dL (ref 0–99)
Total CHOL/HDL Ratio: 3.7 ratio
Triglycerides: 84 mg/dL (ref <150)
VLDL: 17 mg/dL (ref 0–40)

## 2024-08-25 LAB — CBC
HCT: 36.4 % (ref 36.0–46.0)
Hemoglobin: 11.6 g/dL — ABNORMAL LOW (ref 12.0–15.0)
MCH: 28.8 pg (ref 26.0–34.0)
MCHC: 31.9 g/dL (ref 30.0–36.0)
MCV: 90.3 fL (ref 80.0–100.0)
Platelets: 200 K/uL (ref 150–400)
RBC: 4.03 MIL/uL (ref 3.87–5.11)
RDW: 15.2 % (ref 11.5–15.5)
WBC: 6.1 K/uL (ref 4.0–10.5)
nRBC: 0 % (ref 0.0–0.2)

## 2024-08-25 LAB — ECHOCARDIOGRAM COMPLETE
Area-P 1/2: 5.06 cm2
Height: 66 in
S' Lateral: 2.85 cm
Weight: 2812.8 [oz_av]

## 2024-08-25 MED ORDER — GADOBUTROL 1 MMOL/ML IV SOLN
8.0000 mL | Freq: Once | INTRAVENOUS | Status: AC | PRN
  Administered 2024-08-25: 8 mL via INTRAVENOUS

## 2024-08-25 MED ORDER — METOPROLOL SUCCINATE ER 50 MG PO TB24
50.0000 mg | ORAL_TABLET | Freq: Two times a day (BID) | ORAL | 0 refills | Status: AC
  Filled 2024-08-25: qty 60, 30d supply, fill #0

## 2024-08-25 NOTE — Evaluation (Signed)
 Physical Therapy Evaluation Patient Details Name: Brandi Hunter MRN: [id number] DOB: 01/07/1941 Today's Date: 08/25/2024  History of Present Illness  83 y.o female who presented 10/9 with c/o acute-onset aphasia lasting approximately 20 minutes, dx with TIA. 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 PTA, she was mod I using a rollator for functional mobility, living alone in a 1-level house with 6 STE. She has a hx of many falls though. Currently, she appears to be functioning near her baseline, except reporting feeling a bit more fatigued and weaker than usual due to poor food intake due to nausea the past week. She displays symmetrical weakness in her bil legs. She is currently able to perform all functional mobility with a RW without LOB or physical assistance. She does display deficits in endurance, balance, and strength that place her at risk for falls though. Educated pt on her risk for falls and recs to keep a charged phone on her at all times and to keep her rollator proximal to her, slow down her turns, and widen her BOS to improve her safety. She verbalized understanding. As pt appears near her baseline, recommending pt resume HHPT at d/c. Will continue to follow acutely.      If plan is discharge home, recommend the following: Assistance with cooking/housework;Assist for transportation;Help with stairs or ramp for entrance   Can travel by private vehicle        Equipment Recommendations Rollator (4 wheels)  Recommendations for Other Services       Functional Status Assessment Patient has had a recent decline in their functional status and demonstrates the ability to make significant improvements in function in a reasonable and predictable amount of time.      Precautions / Restrictions Precautions Precautions: Fall Recall of Precautions/Restrictions: Intact Precaution/Restrictions Comments: hx of many falls Restrictions Weight Bearing Restrictions Per Provider Order: No      Mobility  Bed Mobility Overal bed mobility: Modified Independent             General bed mobility comments: Pt able to transition supine <> sit L EOB without assistance    Transfers Overall transfer level: Needs assistance Equipment used: Rolling walker (2 wheels) Transfers: Sit to/from Stand Sit to Stand: Supervision           General transfer comment: supervision for safety standing from EOB 2x and from commode 1x, no LOB    Ambulation/Gait Ambulation/Gait assistance: Contact guard assist, Supervision Gait Distance (Feet): 130 Feet (x4 bouts of ~120 ft > ~130 ft > ~15 ft > ~15 ft) Assistive device: Rolling walker (2 wheels) Gait Pattern/deviations: Step-through pattern, Decreased stride length, Narrow base of support Gait velocity: reduced Gait velocity interpretation: <1.8 ft/sec, indicate of risk for recurrent falls   General Gait Details: Pt needed repeated cues to widen her BOS and slow down when turning. She tends to ambulate with almost a tandem step pattern. CGA-supervision for safety  Stairs            Wheelchair Mobility     Tilt Bed    Modified Rankin (Stroke Patients Only) Modified Rankin (Stroke Patients Only) Pre-Morbid Rankin Score: Slight disability Modified Rankin: Slight disability     Balance Overall balance assessment: Needs assistance Sitting-balance support: Feet supported, No upper  extremity supported Sitting balance-Leahy Scale: Good     Standing balance support: Bilateral upper extremity supported, No upper extremity supported, During functional activity Standing balance-Leahy Scale: Fair Standing balance comment: able to stand and reach off COG without UE support but uses RW to ambulate                              Pertinent Vitals/Pain Pain Assessment Pain Assessment: No/denies pain    Home Living Family/patient expects to be discharged to:: Private residence Living Arrangements: Alone Available Help at Discharge: Family;Available PRN/intermittently Type of Home: House Home Access: Stairs to enter;Other (comment) Entrance Stairs-Rails: Right;Left Entrance Stairs-Number of Steps: 6   Home Layout: One level Home Equipment: Agricultural consultant (2 wheels);Rollator (4 wheels);Shower seat - built in;Grab bars - tub/shower;Wheelchair - manual Additional Comments: on 2L [place]    Prior Function Prior Level of Function : Independent/Modified Independent             Mobility Comments: Mod I using rollator, many falls in past 6 months, pt unsure of number ADLs Comments: Manages meds and finances. Does not drive. Son or friend brings groceries and takes her to appointments     Extremity/Trunk Assessment   Upper Extremity Assessment Upper Extremity Assessment: Defer to OT evaluation    Lower Extremity Assessment Lower Extremity Assessment: Generalized weakness    Cervical / Trunk Assessment Cervical / Trunk Assessment: Normal  Communication   Communication Communication: No apparent difficulties    Cognition Arousal: Alert Behavior During Therapy: WFL for tasks assessed/performed   PT - Cognitive impairments: No apparent impairments                         Following commands: Intact       Cueing Cueing Techniques: Verbal cues     General Comments General comments (skin integrity, edema, etc.): Educated pt on her risk for falls and recs to keep a charged phone on her at all times and to keep her rollator proximal to her, slow down her turns, and widen her BOS to improve her safety. She verbalized understanding. VSS on 2L    Exercises     Assessment/Plan    PT Assessment Patient needs continued PT services  PT Problem List Decreased  strength;Decreased activity tolerance;Decreased balance;Decreased mobility       PT Treatment Interventions DME instruction;Gait training;Stair training;Functional mobility training;Therapeutic activities;Therapeutic exercise;Neuromuscular re-education;Balance training;Patient/family education    PT Goals (Current goals can be found in the Care Plan section)  Acute Rehab PT Goals Patient Stated Goal: to improve and go home PT Goal Formulation: With patient Time For Goal Achievement: 09/08/24 Potential to Achieve Goals: Good    Frequency Min 1X/week     Co-evaluation               AM-PAC PT 6 Clicks Mobility  Outcome Measure Help needed turning from your back to your side while in a flat bed without using bedrails?: None Help needed moving from lying on your back to sitting on the side of a flat bed without using bedrails?: None Help needed moving to and from a bed to a chair (including a wheelchair)?: A Little Help needed standing up from a chair using your arms (e.g., wheelchair or bedside chair)?: A Little Help needed to walk in hospital room?: A Little Help needed climbing 3-5 steps with a railing? : A Little 6 Click Score: 20  End of Session Equipment Utilized During Treatment: Gait belt;Oxygen  Activity Tolerance: Patient tolerated treatment well Patient left: in bed;with call bell/phone within reach;Other (comment) (sitting EOB with OT)   PT Visit Diagnosis: Unsteadiness on feet (R26.81);Muscle weakness (generalized) (M62.81);History of falling (Z91.81);Repeated falls (R29.6);Other abnormalities of gait and mobility (R26.89)    Time: 8773-8744 PT Time Calculation (min) (ACUTE ONLY): 29 min   Charges:   PT Evaluation $PT Eval Low Complexity: 1 Low PT Treatments $Gait Training: 8-22 mins PT General Charges $$ ACUTE PT VISIT: 1 Visit         Theo Ferretti, PT, DPT Acute Rehabilitation Services  Office: [phone]   Theo CHRISTELLA Ferretti 08/25/2024, 2:54  PM

## 2024-08-25 NOTE — Care Management Obs Status (Signed)
 MEDICARE OBSERVATION STATUS NOTIFICATION   Patient Details  Name: Brandi Hunter MRN: [id number] Date of Birth: 11-30-1940   Medicare Observation Status Notification Given:  Yes  Obs is signed and copy given  Claretta Deed 08/25/2024, 2:30 PM

## 2024-08-25 NOTE — Plan of Care (Signed)
  Problem: Education: Goal: Knowledge of disease or condition will improve Outcome: Progressing   Problem: Ischemic Stroke/TIA Tissue Perfusion: Goal: Complications of ischemic stroke/TIA will be minimized Outcome: Progressing   Problem: Self-Care: Goal: Ability to participate in self-care as condition permits will improve Outcome: Progressing   

## 2024-08-25 NOTE — Discharge Instructions (Addendum)
 Brandi Hunter,  You were recently admitted to [facility] for workup and treatment of a TIA and abdominal pain. We imaged your abdomen with CT which demonstrated sigmoid diverticulitis. We imaged your abdomen using MRI and found a density on the upper pole of your kidney in addition to a lesion on your liver. We recommend following up with your Primary Care Provider and Urology for further workup. For your diverticulitis, we opted for bowel rest with laxatives and a clear liquid diet. For your TIA, we imaged your head and neck with a CT, MRI and CT Angio. We resumed your home cholesterol and blood pressure medications.   Continue taking your home medications with the following changes  Stop taking Lorazepam  (Ativan ) 0.5mg  Diltiazem  (Cardizem ) 120 mg Furosemide  (Lasix ) 20 mg Meclizine (Antivert) 25 mg Metformin  (Glucophage ) 500 mg Continue taking Acetaminophen  (Tylenol ) 650 mg  Albuterol  (Ventolin )  Apixaban  (Eliquis ) 2.5 mg  B-12 once daily Budesonide -Formoterol  (Symbicort ) Duloxetine  (Cymbalta ) 30 mg Fenofibrate  160 mg Gabapentin  (Neurontin ) 100 mg Ipratropium-albuterol  (Duoneb) Metoprolol  Succinate (Toprol -xl) 50 mg Pantoprazole  (Protonix ) 40 mg Vitamin D3 1.25 mg (50000 UT)   You should seek further medical care if you develop speech difficulties, headaches, changes in vision, worsening abdominal pain, fever above 100.8, shortness of breath, chest pain or any new or worsening symptom.  We are so glad that you are feeling better.  Sincerely, Ora Schuller, MS4      Information on my medicine - ELIQUIS  (apixaban )  This medication education was reviewed with me or my healthcare representative as part of my discharge preparation.   Why was Eliquis  prescribed for you? Eliquis  was prescribed for you to reduce the risk of a blood clot forming that can cause a stroke if you have a medical condition called atrial fibrillation (a type of irregular heartbeat).  What do  You need to know about Eliquis  ? Take your Eliquis  TWICE DAILY - one tablet in the morning and one tablet in the evening with or without food. If you have difficulty swallowing the tablet whole please discuss with your pharmacist how to take the medication safely.  Take Eliquis  exactly as prescribed by your doctor and DO NOT stop taking Eliquis  without talking to the doctor who prescribed the medication.  Stopping may increase your risk of developing a stroke.  Refill your prescription before you run out.  After discharge, you should have regular check-up appointments with your healthcare provider that is prescribing your Eliquis .  In the future your dose may need to be changed if your kidney function or weight changes by a significant amount or as you get older.  What do you do if you miss a dose? If you miss a dose, take it as soon as you remember on the same day and resume taking twice daily.  Do not take more than one dose of ELIQUIS  at the same time to make up a missed dose.  Important Safety Information A possible side effect of Eliquis  is bleeding. You should call your healthcare provider right away if you experience any of the following: Bleeding from an injury or your nose that does not stop. Unusual colored urine (red or dark brown) or unusual colored stools (red or black). Unusual bruising for unknown reasons. A serious fall or if you hit your head (even if there is no bleeding).  Some medicines may interact with Eliquis  and might increase your risk of bleeding or clotting while on Eliquis . To help avoid this, consult your healthcare provider  or pharmacist prior to using any new prescription or non-prescription medications, including herbals, vitamins, non-steroidal anti-inflammatory drugs (NSAIDs) and supplements.  This website has more information on Eliquis  (apixaban ): http://[email or web address] .com/eliquis dena

## 2024-08-25 NOTE — Hospital Course (Addendum)
 Brandi Hunter is a 83 y.o. female with PMH of afib on Eliquis , hyperlipidemia, hypertension, COPD on 2L [place], T2DM without chronic Insulin , GERD, HFpEF, 2nd degree type II AV block s/p pacemaker, CKD Stage 3b and CAD who presented to the ED on 10/9 for expressive aphasia and abdominal pain and was admitted for a TIA and Diverticulitis.   TIA Patient presented for a 20 minute episode of expressive aphasia on 10/9 which resolved by the time she arrived to the ED. No focal neurological defects on exam.  CT head was unremarkable for any acute intracranial abnormality. CT Angio Head and Neck was unremarkable for a large vessel occlusion or significant large vessel stenosis but was remarkable for mild to moderate irregularity of distal MCA and PCA branches without discrete branch occlusion. MRI was unremarkable for acute intracranial abnormality and was significant for moderate chronic small vessel ischemic disease. A1C was 6.0. Lipid panel was as follows: Cholesterol 96, LDL 53, Triglycerides 84. Echo pending at discharge, but no concerning findings on imaging for cardioembolic stroke. She will be updated once results. Patient neurologically stable, clear for discharge.  Diverticulitis Patient presented with worsening abdominal pain and nausea since 10/4. Denied vomiting. CT abdomen pelvis ordered and was remarkable for sigmoid diverticulosis with moderate wall thickening of proximal sigmoid colon suggesting diverticulitis with some free fluid in the pelvis in addition to a 3.5 x 2.5 cm thick walled low-density structure seen arising from upper pole of left kidney.  Lipase was within normal limits. Patient did not have a fever and white count was within normal range. Antibiotics were deferred and conservative management with clear liquids and laxatives were ordered. Patient abdominal pain and nausea were improved, medically clear for discharge.  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 Recommend outpatient Urology consultation.  Liver Lesion on Peripheral Segment 3 A 1.9 x 1.8 cm focus of arterial enhancement within peripheral hepatic segment 3 without washout or peripheral pseudo capsule was found on abdominal MRI. Per MRI, If there are known risk factors for hepatocellular carcinoma, repeat imaging in 3-6 months with dedicated liver protocol MRI is recommended  -Recommend follow up with PCP for workup.   Recurrent Falls Given patient history of recent falls in the setting of deconditioning, patient would definitely benefit  from electric wheelchair and home PT to prevent injury to herself. 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 Currently hypertensive, most recent BP 159/82. Per most recent EKG, PR interval 50ms. Clinically stable. -Resumed home Toprol  50 mg BID for rate control and BP management -Resumed home Eliquis    Hyperlipidemia CAD Aortic Atherosclerosis Most recent lipid panel (Cholesterol: 96, Trig: 84, HDL: 26, LDL: 53). -Resumed home Atorvastatin  40 mg   COPD on 2L [place] OSA Patient saturated well on home 2L [place]. No concerns for exacerbation since patient denied cough or increased work of breathing.  -Resumed home albuterol  and Breo-Ellipta   T2DM  Diabetic Neuropathy Glucose stable in the 180s. Hemoglobin A1C: 6.0.  -Resumed home gabapentin  200mg  nightly -Resumed home duloxetine  30 mg nightly   HFpEF Most recent echocardiogram in 04/2020 with EF 60-65%, no regional wall motion abnormalities, mild LVH with grade II diastolic dysfunction, severely left and right atrial dilation, mild to moderate mitral valve regurgitation, and normal RV function.  -Euvolemic during admission and stable

## 2024-08-25 NOTE — Discharge Summary (Signed)
 Name: Brandi Hunter MRN: [id number] DOB: [birth year] 83 y.o. PCP: Medicine, [facility] (Inactive)  Date of Admission: 08/24/2024 11:16 AM Date of Discharge: 08/25/2024  Attending Physician: Dr. 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 Commonly known as: CARDIZEM  CD TAKE 1 CAPSULE EVERY DAY   furosemide  20 MG tablet Wait to take this until your doctor or other care provider tells you to start again. Commonly known as: LASIX  TAKE 1 TABLET EVERY DAY   meclizine 25 MG tablet Wait to take this until your doctor or other care provider tells you to start again. Commonly known as: ANTIVERT Take 25 mg by mouth 3 (three) times daily as needed for dizziness.   metFORMIN  500 MG tablet Wait to take this until your doctor or other care provider tells you to start again. Commonly known as: GLUCOPHAGE  Take 500 mg by mouth daily.       STOP taking these medications    LORazepam  0.5 MG tablet Commonly known as: ATIVAN        TAKE these medications    acetaminophen  650 MG CR tablet Commonly known as: TYLENOL  Take 650 mg by mouth every 8 (eight) hours as needed for pain.   albuterol  108 (90 Base) MCG/ACT inhaler Commonly known as: VENTOLIN  HFA Inhale 2 puffs into the lungs every 4 (four) hours as needed  for wheezing.   apixaban  2.5 MG Tabs tablet Commonly known as: ELIQUIS  Take 1 tablet (2.5 mg total) by mouth 2 (two) times daily.   atorvastatin  40 MG tablet Commonly known as: LIPITOR Take 40 mg by mouth at bedtime.   B-12 PO Take 1 tablet by mouth daily.   budesonide -formoterol  160-4.5 MCG/ACT inhaler Commonly known as: SYMBICORT  Inhale 2 puffs into the lungs 2 (two) times daily.   DULoxetine  30 MG capsule Commonly known as: CYMBALTA  Take 30 mg by mouth at bedtime.   fenofibrate  160 MG tablet Take 160 mg by mouth every evening.   gabapentin  100 MG capsule Commonly known as: NEURONTIN  Take 200 mg by mouth at bedtime.   ipratropium-albuterol  0.5-2.5 (3) MG/3ML Soln Commonly known as: DUONEB Take 3 mLs by nebulization 4 (four) times daily.   metoprolol  succinate 50 MG 24 hr tablet Commonly known as: TOPROL -XL Take 1 tablet (50 mg total) by mouth 2 (two) times daily.   pantoprazole  40 MG tablet Commonly known as: PROTONIX  TAKE 1 TABLET EVERY DAY   Vitamin D3 1.25 MG (50000 UT) Caps Take 1 capsule by mouth once a week.               Durable Medical Equipment  (From admission, onward)           Start     Ordered   08/25/24 1414  DME Walker  Once  Question Answer Comment  Walker: With 5 Inch Wheels   Patient needs a walker to treat with the following condition Physical deconditioning      08/25/24 1413   08/25/24 1412  For home use only DME 4 wheeled rolling walker with seat  Once       Question Answer Comment  Patient needs a walker to treat with the following condition Aphagia   Patient needs a walker to treat with the following condition Physical deconditioning   Patient needs a walker to treat with the following condition Generalized weakness      08/25/24 1412            Disposition and follow-up:   Brandi Hunter was discharged from [facility] in Stable condition.  At the hospital follow up visit please  address:  1.  Follow-up:  a. TIA: Patient presented with expressive aphasia which is resolved. Please follow up and ensure continues to be at baseline. Ensure adherence to BP, lipid and diabetes medications.     b. Sigmoidal Diverticulitis: Ensure patient is stable, afebrile and no longer constipated.    c. Upper pole renal lesion: MRI returned an exophytic, thick-walled cyst, Bosniak III. Please ensure patient has follow up with Urology for evaluation.   d. Given patient recurrent falls, ensure patient is not taking her Ativan  and Meclozine. Ensure electric wheelchair and home PT has been arranged for patient.    2.  Labs / imaging needed at time of follow-up: CBC, BMP  3.  Pending labs/ test needing follow-up: None   Follow-up Appointments:  Follow-up Information     Care, [facility] Follow up.   Specialty: Home Health Services Why: [facility] will continue to provide home health services. Contact information: [address] [place] [address] [phone]         [facility], [facility]  Follow up.   Why: [employer] will deliver a rolator to your hospital room before you are discharged home. Contact information: [address] [place] [address] [phone]                 Hospital Course by problem list: Brandi Hunter is a 83 y.o. 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   TIA Patient presented for a 20 minute episode of expressive aphasia on 10/9 which resolved by the time she arrived to the ED. No focal neurological defects on exam.  CT head was unremarkable for any acute intracranial abnormality. CT Angio Head and Neck was unremarkable for a large vessel occlusion or  significant large vessel stenosis but was remarkable for mild to moderate irregularity of distal MCA and PCA branches without discrete branch occlusion. MRI was unremarkable for acute intracranial abnormality and was significant for moderate chronic small vessel ischemic disease. A1C was 6.0. Lipid panel was as follows: Cholesterol 96, LDL 53, Triglycerides 84. Echo pending at discharge, but no concerning findings on imaging for cardioembolic stroke. She will be updated once results. Patient neurologically stable, clear for discharge.  Diverticulitis Patient presented with worsening abdominal pain and nausea since 10/4. Denied vomiting. CT abdomen pelvis ordered and was remarkable for sigmoid diverticulosis with moderate wall thickening of proximal sigmoid colon  suggesting diverticulitis with some free fluid in the pelvis in addition to a 3.5 x 2.5 cm thick walled low-density structure seen arising from upper pole of left kidney.  Lipase was within normal limits. Patient did not have a fever and white count was within normal range. Antibiotics were deferred and conservative management with clear liquids and laxatives were ordered. Patient abdominal pain and nausea were improved, medically clear for discharge.  Upper Pole Lesion of Left Kidney MR Abdomen demonstrated an exophytic thick-walled cyst arising from the posteromedial left upper love, Bosniak III. Recommend outpatient Urology consultation.  Liver Lesion on Peripheral Segment 3 A 1.9 x 1.8 cm focus of arterial enhancement within peripheral hepatic segment 3 without washout or peripheral pseudo capsule was found on abdominal MRI. Per MRI, If there are known risk factors for hepatocellular carcinoma, repeat imaging in 3-6 months with dedicated liver protocol MRI is recommended  -Recommend follow up with PCP for workup.   Recurrent Falls Given patient history of recent falls in the setting of deconditioning, patient would definitely benefit  from  electric wheelchair and home PT to prevent injury to herself. 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 Currently hypertensive, most recent BP 159/82. Per most recent EKG, PR interval 50ms. Clinically stable. -Resumed home Toprol  50 mg BID for rate control and BP management -Resumed home Eliquis    Hyperlipidemia CAD Aortic Atherosclerosis Most recent lipid panel (Cholesterol: 96, Trig: 84, HDL: 26, LDL: 53). -Resumed home Atorvastatin  40 mg   COPD on 2L [place] OSA Patient saturated well on home 2L [name]. No concerns for exacerbation since patient denied cough or increased work of breathing.  -Resumed home albuterol  and Breo-Ellipta   T2DM  Diabetic Neuropathy Glucose stable in the 180s. Hemoglobin A1C: 6.0.  -Resumed home gabapentin  200mg  nightly -Resumed home duloxetine  30 mg nightly   HFpEF Most recent echocardiogram in 04/2020 with EF 60-65%, no regional wall motion abnormalities, mild LVH with grade II diastolic dysfunction, severely left and right atrial dilation, mild to moderate mitral valve regurgitation, and normal RV function.  -Euvolemic during admission and stable     Discharge Subjective: Patient states she is feeling better this morning. Denies issues with speech. States abdominal pain and nausea have improved. Has not had a bowel movement since admission stating her bowel movement schedule is irregular. Denies fever, chills and vomiting.   Discharge Exam:   BP (!) 156/70 (BP Location: Right Arm)   Pulse 70   Temp 98.3 F (36.8 C)   Resp 18   Ht 5' 6 (1.676 m)   Wt 79.7 kg   SpO2 100%   BMI 28.37 kg/m  Eyes:     Extraocular Movements: Extraocular movements intact.     Conjunctiva/sclera: Conjunctivae normal.  Cardiovascular:     Rate and Rhythm: Normal rate and regular rhythm.     Pulses: Normal pulses.     Heart sounds: Normal heart sounds.  Pulmonary:      Effort: Pulmonary effort is normal.     Breath sounds: Normal breath sounds.  Abdominal:     General: Abdomen is flat.     Tenderness: Tenderness on left side. There is no guarding.  Musculoskeletal:     Cervical back: Normal range of motion.     Right lower leg: No edema.     Left lower leg: No edema.     Comments: Tenderness around right shoulder  Neurological:     General: No focal deficit present.     Mental Status: She is alert and oriented to person, place, and time.  Psychiatric:        Mood and Affect: Mood normal.        Thought Content: Thought content normal  Pertinent Labs, Studies, and Procedures:     Latest Ref Rng & Units 08/25/2024    5:35 AM 08/24/2024   11:21 AM 08/24/2024   11:18 AM  CBC  WBC 4.0 - 10.5 K/uL 6.1   8.0   Hemoglobin 12.0 - 15.0 g/dL 88.3  86.0  87.2   Hematocrit 36.0 - 46.0 % 36.4  41.0  40.7   Platelets 150 - 400 K/uL 200   249        Latest Ref Rng & Units 08/25/2024    5:35 AM 08/24/2024   11:21 AM 08/24/2024   11:18 AM  CMP  Glucose 70 - 99 mg/dL 879  815  816   BUN 8 - 23 mg/dL 23  38  29   Creatinine 0.44 - 1.00 mg/dL 8.81  8.49  8.60   Sodium 135 - 145 mmol/L 137  136  135   Potassium 3.5 - 5.1 mmol/L 3.6  3.7  3.7   Chloride 98 - 111 mmol/L 96  96  96   CO2 22 - 32 mmol/L 26   27   Calcium  8.9 - 10.3 mg/dL 8.3   8.5   Total Protein 6.5 - 8.1 g/dL   6.0   Total Bilirubin 0.0 - 1.2 mg/dL   1.7   Alkaline Phos 38 - 126 U/L   49   AST 15 - 41 U/L   48   ALT 0 - 44 U/L   30     MR ABDOMEN W WO CONTRAST Result Date: 08/25/2024 CLINICAL DATA:  Left renal mass EXAM: MRI ABDOMEN WITHOUT AND WITH CONTRAST TECHNIQUE: Multiplanar multisequence MR imaging of the abdomen was performed both before and after the administration of intravenous contrast. CONTRAST:  8mL GADAVIST GADOBUTROL 1 MMOL/ML IV SOLN COMPARISON:  CT abdomen and pelvis dated 08/24/2024 FINDINGS: Lower chest: No acute findings. Partially imaged multichamber  cardiomegaly. Hepatobiliary: Mildly lobulated hepatic contour. 1.9 x 1.8 cm focus of arterial enhancement within peripheral segment 3 (801:42) without washout or peripheral pseudo capsule. No bile duct dilation. Normal gallbladder. Pancreas: No mass, inflammatory changes, or other parenchymal abnormality identified. Spleen:  Within normal limits in size and appearance. Adrenals/Urinary Tract: No adrenal nodules. Exophytic T2 hyperintense cyst demonstrates diffuse wall thickening, measuring up to 4 mm arising from the posteromedial left upper lobe measuring 3.2 x 2.4 cm demonstrates central T1 hypointensity and reduced diffusivity (5:64, 550:25). No hydronephrosis. Additional bilateral subcentimeter nonenhancing cysts. Stomach/Bowel: Visualized portions within the abdomen are unremarkable. Vascular/Lymphatic: No pathologically enlarged lymph nodes identified. No abdominal aortic aneurysm demonstrated. Other: Right hemidiaphragmatic eventration. Trace perihepatic free fluid. Musculoskeletal: No suspicious bone lesions identified. IMPRESSION: 1. Queried renal lesion corresponds to an exophytic, thick-walled cyst arising from the posteromedial left upper lobe, Bosniak III. Bosniak III masses have intermediate probability of being malignant. If not already obtained, consider seeking urology consultation. (Reference: Bosniak Classification of Cystic Renal Masses, Version 2019. Radiology 2019; 292(2): 475-488.) 2. A 1.9 x 1.8 cm focus of arterial enhancement within peripheral hepatic segment 3 without washout or peripheral pseudo capsule. If there are known risk factors for hepatocellular carcinoma, repeat imaging in 3-6 months with dedicated liver protocol MRI is recommended. 3.  Trace perihepatic free fluid. Electronically Signed   By: Limin  Xu M.D.   On: 08/25/2024 11:12   MR BRAIN WO CONTRAST Result Date: 08/24/2024 CLINICAL DATA:  Neuro deficit, acute, stroke suspected. EXAM: MRI HEAD WITHOUT CONTRAST TECHNIQUE:  Multiplanar, multiecho pulse sequences of the brain and surrounding structures were obtained without intravenous contrast. COMPARISON:  Head CT and CTA 08/24/2024 FINDINGS: Brain: There is no evidence of an acute infarct, mass, midline shift, or extra-axial fluid collection. A chronic microhemorrhage is noted in the left thalamus. Patchy T2 hyperintensities in the cerebral white matter and pons are nonspecific but compatible with moderate chronic small vessel ischemic disease. There is mild cerebral atrophy. Vascular: Major intracranial vascular flow voids are preserved. Skull and upper cervical spine: Unremarkable bone marrow signal. Sinuses/Orbits: Bilateral cataract extraction. Paranasal sinuses and mastoid air cells are clear. Other: None. IMPRESSION: 1. No acute intracranial abnormality. 2. Moderate chronic small vessel ischemic disease. Electronically Signed   By: Dasie Hamburg M.D.   On: 08/24/2024 18:58   CT ABDOMEN PELVIS W CONTRAST Result Date: 08/24/2024 CLINICAL DATA:  Acute generalized abdominal pain. EXAM: CT ABDOMEN AND PELVIS WITH CONTRAST TECHNIQUE: Multidetector CT imaging of the abdomen and pelvis was performed using the standard protocol following bolus administration of intravenous contrast. RADIATION DOSE REDUCTION: This exam was performed according to the departmental dose-optimization program which includes automated exposure control, adjustment of the mA and/or kV according to patient size and/or use of iterative reconstruction technique. CONTRAST:  75mL OMNIPAQUE  IOHEXOL  350 MG/ML SOLN COMPARISON:  May 09, 2014. FINDINGS: Lower chest: No acute abnormality. Hepatobiliary: No cholelithiasis or biliary dilatation is noted. 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 Pancreas: Unremarkable. No pancreatic ductal dilatation or surrounding inflammatory changes. Spleen: Normal in size without focal abnormality. Adrenals/Urinary Tract: Adrenal glands appear normal. 3.5 x  2.5 cm thick walled low-density structure is seen arising from upper pole of left kidney which may represent collapse cyst, but MRI is recommended to rule out neoplasm. No hydronephrosis or renal obstruction is noted. Urinary bladder is unremarkable. Stomach/Bowel: Stomach is unremarkable. There is no evidence of bowel obstruction or inflammation. Sigmoid diverticulosis is noted with moderate wall thickening of proximal sigmoid colon suggesting diverticulitis. Appendix is not clearly visualized. Vascular/Lymphatic: Aortic atherosclerosis. No enlarged abdominal or pelvic lymph nodes. Reproductive: Status post hysterectomy. No adnexal masses. Other: No hernia is noted. Small amount of free fluid is noted in the pelvis which may be related to probable diverticulitis. Musculoskeletal: No acute or significant osseous findings. IMPRESSION: 1. Sigmoid diverticulosis is noted with moderate wall thickening of proximal sigmoid colon suggesting diverticulitis. Small amount of free fluid is noted in the pelvis which may be related to this inflammation. 2. 3.5 x 2.5 cm thick walled low-density structure is seen arising from upper pole of left kidney which may represent collapsed cyst, but MRI is recommended to rule out neoplasm. 3. 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 4. Aortic atherosclerosis. Aortic Atherosclerosis (ICD10-I70.0). Electronically Signed   By: Lynwood Landy Raddle M.D.   On: 08/24/2024 12:54   CT ANGIO HEAD NECK W WO CM Result Date: 08/24/2024 EXAM: CTA HEAD AND NECK WITHOUT AND WITH 08/24/2024 12:13:00 PM TECHNIQUE: CTA of the head and neck was performed without and with the administration of 75 mL of intravenous iohexol  (OMNIPAQUE ) 350 MG/ML injection. Multiplanar 2D and/or 3D reformatted images are provided for review. Automated exposure control, iterative reconstruction, and/or weight based adjustment of the mA/kV was utilized to reduce  the radiation dose to as low as reasonably  achievable. Stenosis of the internal carotid arteries measured using NASCET criteria. COMPARISON: Head CT earlier today. Chest CT 06/28/2020. CLINICAL HISTORY: 83 year old female with code stroke presentation today. Stroke/TIA, determine embolic source. Aphasia. Abd pain. FINDINGS: CTA NECK: AORTIC ARCH AND ARCH VESSELS: 3 vessel aortic arch with mild to moderate aortic atherosclerosis. No significant stenosis of the brachiocephalic or subclavian arteries. CERVICAL CAROTID ARTERIES: Brachiocephalic artery plaque without stenosis. No right CCA plaque. Minimal plaque of the right carotid bifurcation. Tortuous right ICA in the neck. Right ICA siphon mild calcified plaque without stenosis. Minimal left CCA plaque and no stenosis. Calcified plaque at the left ICA origin is soft and calcified, mild to moderate but does not result in stenosis. Up to moderate left ICA siphon cavernous segment calcified plaque, no significant left siphon stenosis. CERVICAL VERTEBRAL ARTERIES: Proximal subclavian artery atherosclerosis without stenosis. Left vertebral artery origin is normal. Calcified plaque at the right vertebral artery origin without stenosis. Mildly dominant left vertebral artery. Both cervical vertebral arteries are tortuous, minimal vertebral artery atherosclerosis in the neck. No vertebral artery stenosis to the vertebrobasilar junction. LUNGS AND MEDIASTINUM: Right chest cardiac pacemaker. Negative upper lungs. SOFT TISSUES: Venous contrast reflux in the right IJ and a right sided thyroid  vein incidentally noted. No acute abnormality. BONES: Upper thoracic T2 and T3 superior endplate compression fractures are new since 2021, but appear sclerotic and are probably nonacute. Superimposed cervical spine degeneration. No suspicious osseous lesion. CTA HEAD: ANTERIOR CIRCULATION: No significant stenosis of the internal carotid arteries. Normal ACA origins. Diminutive or absent anterior communicating artery. Bilateral ACA  branches are within normal limits. MCA M1 segments and bifurcations are patent without stenosis. No aneurysm. POSTERIOR CIRCULATION: No significant stenosis of the vertebral arteries. Patent basilar artery without stenosis. Normal basilar tip, SCA and PCA origins. Diminutive or absent posterior communicating arteries. Bilateral PCA branches are within normal limits. There is mild to moderate irregularity and stenosis of the right PCA P3 branches (series 15 image 16) with no discrete PCA branch occlusion. No aneurysm. OTHER: IMPRESSION: 1. No large vessel occlusion or significant large vessel stenosis in the head or neck. 2. Mild to moderate irregularity of distal MCA and PCA branches without discrete branch occlusion. 3. These results were communicated to Dr. Matthews at 12:43 on 08/24/2024 by text page via the [facility] messaging system. Electronically signed by: Helayne Hurst MD 08/24/2024 12:45 PM EDT RP Workstation: [id number]   CT HEAD CODE STROKE WO CONTRAST Result Date: 08/24/2024 EXAM: CT HEAD WITHOUT CONTRAST 08/24/2024 11:20:28 AM TECHNIQUE: CT of the head was performed without the administration of intravenous contrast. Automated exposure control, iterative reconstruction, and/or weight based adjustment of the mA/kV was utilized to reduce the radiation dose to as low as reasonably achievable. COMPARISON: CT 01/24/2023. CLINICAL HISTORY: 83 year old female. Acute neuro deficit, stroke suspected. Code stroke. LKW 10:10 AM, aphasia. FINDINGS: BRAIN AND VENTRICLES: No acute hemorrhage. No evidence of acute infarct. No hydrocephalus. No extra-axial collection. No mass effect or midline shift. Cerebral volume remains normal for age. Gray white differentiation appears stable and largely normal for age. Patchy and asymmetric mild left hemisphere deep white matter hypodensity appears stable. Calcified atherosclerosis at the skull base. No suspicious intracranial vascular hyperdensity. ORBITS: No acute abnormality.  SINUSES: No acute abnormality. SOFT TISSUES AND SKULL: No acute soft tissue abnormality. No skull fracture. [place] stroke program early CT score (aspects) ----- Ganglionic (caudate, ic, Lentiform Nucleus, insula, M1-m3): 7 Supraganglionic (m4-m6): 3 Total: 10  IMPRESSION: 1. No acute intracranial abnormality. Stable mild for age chronic white matter changes. ASPECTS: 10. These results were communicated to Dr. Matthews at 11:27 on 08/24/2024 by text page via the [facility] messaging system. Electronically signed by: Helayne Hurst MD 08/24/2024 11:28 AM EDT RP Workstation: [id number]     Discharge Instructions:   Discharge Instructions      Brandi Hunter,  You were recently admitted to [facility] for workup and treatment of a TIA and abdominal pain. We imaged your abdomen with CT which demonstrated sigmoid diverticulitis. We imaged your abdomen using MRI and found a density on the upper pole of your kidney in addition to a lesion on your liver. We recommend following up with your Primary Care Provider and Urology or further workup. For your diverticulitis, we opted for bowel rest with laxatives and a clear liquid diet. For your TIA, we imaged your head and neck with a CT, MRI and CT Angio. We resumed your home cholesterol and blood pressure medications.   Continue taking your home medications with the following changes  Stop taking Lorazepam  (Ativan ) 0.5mg  Diltiazem  (Cardizem ) 120 mg Furosemide  (Lasix ) 20 mg Meclizine (Antivert) 25 mg Metformin  (Glucophage ) 500 mg Continue taking Acetaminophen  (Tylenol ) 650 mg  Albuterol  (Ventolin )  Apixaban  (Eliquis ) 2.5 mg  B-12 once daily Budesonide -Formoterol  (Symbicort ) Duloxetine  (Cymbalta ) 30 mg Fenofibrate  160 mg Gabapentin  (Neurontin ) 100 mg Ipratropium-albuterol  (Duoneb) Metoprolol  Succinate (Toprol -xl) 50 mg Pantoprazole  (Protonix ) 40 mg Vitamin D3 1.25 mg (50000 UT)   You should seek further medical care if you develop speech difficulties,  headaches, changes in vision, worsening abdominal pain, fever above 100.8, shortness of breath, chest pain or any new or worsening symptom.  We are so glad that you are feeling better.  Sincerely, Ora Schuller, MS4      Information on my medicine - ELIQUIS  (apixaban )  This medication education was reviewed with me or my healthcare representative as part of my discharge preparation.   Why was Eliquis  prescribed for you? Eliquis  was prescribed for you to reduce the risk of a blood clot forming that can cause a stroke if you have a medical condition called atrial fibrillation (a type of irregular heartbeat).  What do You need to know about Eliquis  ? Take your Eliquis  TWICE DAILY - one tablet in the morning and one tablet in the evening with or without food. If you have difficulty swallowing the tablet whole please discuss with your pharmacist how to take the medication safely.  Take Eliquis  exactly as prescribed by your doctor and DO NOT stop taking Eliquis  without talking to the doctor who prescribed the medication.  Stopping may increase your risk of developing a stroke.  Refill your prescription before you run out.  After discharge, you should have regular check-up appointments with your healthcare provider that is prescribing your Eliquis .  In the future your dose may need to be changed if your kidney function or weight changes by a significant amount or as you get older.  What do you do if you miss a dose? If you miss a dose, take it as soon as you remember on the same day and resume taking twice daily.  Do not take more than one dose of ELIQUIS  at the same time to make up a missed dose.  Important Safety Information A possible side effect of Eliquis  is bleeding. You should call your healthcare provider right away if you experience any of the following: Bleeding from an injury or your nose that does  not stop. Unusual colored urine (red or dark brown) or unusual colored  stools (red or black). Unusual bruising for unknown reasons. A serious fall or if you hit your head (even if there is no bleeding).  Some medicines may interact with Eliquis  and might increase your risk of bleeding or clotting while on Eliquis . To help avoid this, consult your healthcare provider or pharmacist prior to using any new prescription or non-prescription medications, including herbals, vitamins, non-steroidal anti-inflammatory drugs (NSAIDs) and supplements.  This website has more information on Eliquis  (apixaban ): [email or web address]     SignedBETHA Ora Schuller, MS4 08/25/2024, 3:12 PM

## 2024-08-25 NOTE — Telephone Encounter (Signed)
 Patient Product/process development scientist completed.    The patient is insured through [employer]. Patient has Medicare and is not eligible for a copay card, but may be able to apply for patient assistance or Medicare RX Payment Plan (Patient Must reach out to their plan, if eligible for payment plan), if available.    Ran test claim for dabigatran (Pradaxa) 150 mg and the current 30 day co-pay is $0.00.   This test claim was processed through [facility]- copay amounts may vary at other pharmacies due to pharmacy/plan contracts, or as the patient moves through the different stages of their insurance plan.     Reyes Sharps, CPHT Pharmacy Technician Patient Advocate Specialist Lead [facility] Patient Advocate Team Direct Number: [phone]  Fax: [phone]

## 2024-08-25 NOTE — Progress Notes (Signed)
    Durable Medical Equipment  (From admission, onward)           Start     Ordered   08/25/24 1351  For home use only DME 4 wheeled rolling walker with seat  Once       Question Answer Comment  Patient needs a walker to treat with the following condition Aphagia   Patient needs a walker to treat with the following condition Physical deconditioning   Patient needs a walker to treat with the following condition Generalized weakness      08/25/24 1352

## 2024-08-25 NOTE — TOC Progression Note (Signed)
 Transition of Care [facility]) - Progression Note    Patient Details  Name: Brandi Hunter MRN: [id number] Date of Birth: 1941-05-18  Transition of Care [facility]) CM/SW Contact  [name], RN Phone Number: 08/25/2024, 2:06 PM  Clinical Narrative:    CM met with the patient at the bedside and patient states that she plans to return home when stable.  Patient lives alone and son assists as needed.  DME at the home includes home oxygen  at 2L/min [name] and has portable oxygen  tank in the hospital room.  WC is at home as well.  Patient is active with [facility] home health for PT, OT - renewed orders placed by MD and [name] is aware to continue services.  Patient was offered Medicare choice regarding DME company and she did not have a preference.  [employer] called to deliver Rolator to the patient's room before she is discharged to home.  Patient states that her son will provide transportation to home when stable.                     Expected Discharge Plan and Services                                               Social Drivers of Health (SDOH) Interventions SDOH Screenings   Food Insecurity: No Food Insecurity (08/24/2024)  Housing: Low Risk  (05/09/2024)  Transportation Needs: No Transportation Needs (05/09/2024)  Utilities: Not At Risk (05/09/2024)  Financial Resource Strain: Medium Risk (04/11/2024)   Received from [facility]  Physical Activity: Unknown (04/11/2024)   Received from [facility]  Social Connections: Socially Isolated (05/09/2024)  Stress: Stress Concern Present (04/11/2024)   Received from [facility]  Tobacco Use: Medium Risk (08/03/2024)   Received from [facility]    Readmission Risk Interventions     No data to display

## 2024-08-25 NOTE — Progress Notes (Signed)
 Discharge Nurse Summary: DC order noted per MD. DC RN at bedside with patient. Patient agreeable with discharge plan, states family will arrive soon for pickup. AVS printed/reviewed. Discussed stroke education, BEFAST stroke assessment protocol, risks for stroke, and when to notify provider/call 911. PIV removed, skin intact. No DME needs. No home/TOC meds. CP/Edu resolved. Telemonitor returned to charging station. All belongings accounted for. Patient wheeled downstairs for discharge by private auto.   Rosario EMERSON Lund, RN

## 2024-08-25 NOTE — TOC CAGE-AID Note (Signed)
 Transition of Care [facility]) - CAGE-AID Screening   Patient Details  Name: Brandi Hunter MRN: [id number] Date of Birth: 08-29-1941  Transition of Care [facility]) CM/SW Contact:    Lilygrace Rodick E Jarae Panas, LCSW Phone Number: 08/25/2024, 11:16 AM   Clinical Narrative: Patient states she drinks wine occasionally. Patient denies SA resource needs.    CAGE-AID Screening:    Have You Ever Felt You Ought to Cut Down on Your Drinking or Drug Use?: No Have People Annoyed You By Critizing Your Drinking Or Drug Use?: No Have You Felt Bad Or Guilty About Your Drinking Or Drug Use?: No Have You Ever Had a Drink or Used Drugs First Thing In The Morning to Steady Your Nerves or to Get Rid of a Hangover?: No CAGE-AID Score: 0  Substance Abuse Education Offered: Yes

## 2024-08-25 NOTE — Evaluation (Signed)
 Occupational Therapy Evaluation Patient Details Name: Brandi Hunter MRN: [id number] DOB: 1941/10/03 Today's Date: 08/25/2024   History of Present Illness   83 y.o female who presented with c/o acute-onset aphasia lasting approximately 20 minutes, dx with TIA. 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 Pt lives alone, 6 STE, PLOF mod I, O2 supplementation 24/7 at baseline, history of falls, uses Rollator for mobility. Pt at this time likely close to baseline, limited by decreased activity tolerance. O2 saturation appeared to drop to high 80's during session, but with poor pleth, quickly improve to 99% once pleth improved. Pt has good overall strength, able to complete ADLs mod I, supervisions for safety with mobility. Pt has no further acute OT needs, no OT follow up needed, has received HHPT in the past and would like to continue that. Pt would benefit from rollator for return home to maximize safety with mobility at home and out in community.      If plan is discharge home, recommend the following:   Assist for transportation;Help with stairs or ramp for entrance     Functional Status Assessment   Patient has had a recent decline in their functional status and demonstrates the ability to make significant improvements in function in a reasonable and predictable amount of time.     Equipment Recommendations   Other (comment) (rollator)     Recommendations for Other Services         Precautions/Restrictions   Precautions Precautions: Fall Recall of Precautions/Restrictions: Intact Precaution/Restrictions Comments: hx of many falls Restrictions Weight Bearing Restrictions Per Provider Order: No     Mobility Bed Mobility Overal bed mobility: Modified  Independent             General bed mobility comments: Pt sitting EOB upon entry, able to get in bed without assistance    Transfers Overall transfer level: Needs assistance Equipment used: Rolling walker (2 wheels) Transfers: Sit to/from Stand Sit to Stand: Supervision           General transfer comment: supervision for safety      Balance Overall balance assessment: Mild deficits observed, not formally tested                                         ADL either performed or assessed with clinical judgement   ADL Overall ADL's : Needs assistance/impaired                                       General ADL Comments: supervision for safety     Vision Baseline Vision/History: 0 No visual deficits Ability to See in Adequate Light: 0 Adequate Patient Visual Report: No change from baseline       Perception         Praxis         Pertinent Vitals/Pain Pain Assessment Pain Assessment: No/denies pain     Extremity/Trunk Assessment Upper Extremity Assessment Upper Extremity Assessment: Overall WFL for tasks assessed   Lower Extremity Assessment Lower Extremity Assessment: Defer to PT evaluation  Communication Communication Communication: No apparent difficulties   Cognition Arousal: Alert Behavior During Therapy: WFL for tasks assessed/performed Cognition: No apparent impairments                               Following commands: Intact       Cueing  General Comments   Cueing Techniques: Verbal cues      Exercises     Shoulder Instructions      Home Living Family/patient expects to be discharged to:: Private residence Living Arrangements: Alone Available Help at Discharge: Family;Available PRN/intermittently Type of Home: House Home Access: Stairs to enter;Other (comment) Entrance Stairs-Number of Steps: 6 Entrance Stairs-Rails: Right;Left Home Layout: One level     Bathroom  Shower/Tub: Producer, television/film/video: Standard     Home Equipment: Agricultural consultant (2 wheels);Rollator (4 wheels);Shower seat - built in;Grab bars - tub/shower;Wheelchair - manual   Additional Comments: on 2L O2      Prior Functioning/Environment Prior Level of Function : Independent/Modified Independent             Mobility Comments: Mod I using rollator, many falls in past 6 months, pt unsure of number ADLs Comments: Manages meds and finances. Does not drive. Son or friend brings groceries and takes her to appointments    OT Problem List: Decreased strength;Decreased activity tolerance;Impaired balance (sitting and/or standing)   OT Treatment/Interventions:        OT Goals(Current goals can be found in the care plan section)   Acute Rehab OT Goals Patient Stated Goal: to improve activity tolerance OT Goal Formulation: With patient Time For Goal Achievement: 09/08/24 Potential to Achieve Goals: Good   OT Frequency:       Co-evaluation              AM-PAC OT 6 Clicks Daily Activity     Outcome Measure Help from another person eating meals?: None Help from another person taking care of personal grooming?: A Little Help from another person toileting, which includes using toliet, bedpan, or urinal?: A Little Help from another person bathing (including washing, rinsing, drying)?: A Little Help from another person to put on and taking off regular upper body clothing?: A Little Help from another person to put on and taking off regular lower body clothing?: A Little 6 Click Score: 19   End of Session Equipment Utilized During Treatment: Rolling walker (2 wheels) Nurse Communication: Mobility status  Activity Tolerance: Patient tolerated treatment well Patient left: in bed;with call bell/phone within reach;with bed alarm set  OT Visit Diagnosis: Unsteadiness on feet (R26.81);Other abnormalities of gait and mobility (R26.89);Repeated falls (R29.6);History  of falling (Z91.81);Muscle weakness (generalized) (M62.81)                Time: 1258-1310 OT Time Calculation (min): 12 min Charges:  OT General Charges $OT Visit: 1 Visit OT Evaluation $OT Eval Low Complexity: 1 Low  [address], OTR/L   Elouise JONELLE Bott 08/25/2024, 1:43 PM

## 2024-08-25 NOTE — Progress Notes (Signed)
 Transition of Care [facility]) - Inpatient Brief Assessment   Patient Details  Name: Brandi Hunter MRN: [id number] Date of Birth: May 02, 1941  Transition of Care [facility]) CM/SW Contact:    [name], RN Phone Number: 08/25/2024, 9:33 AM   Clinical Narrative: Patient admitted from home with TIA.  No IP Care management needs at this time.   Transition of Care Asessment: Insurance and Status: (P) Insurance coverage has been reviewed Patient has primary care physician: (P) Yes Home environment has been reviewed: (P) from home Prior level of function:: (P) self Prior/Current Home Services: (P) No current home services Social Drivers of Health Review: (P) SDOH reviewed no interventions necessary Readmission risk has been reviewed: (P) Yes Transition of care needs: (P) no transition of care needs at this time

## 2024-08-25 NOTE — Progress Notes (Addendum)
 STROKE TEAM PROGRESS NOTE    SIGNIFICANT HOSPITAL EVENTS 10/9 - 83 y.o female who presented with c/o acute-onset aphasia lasting approximately 20 minutes. NIH on admission was 0. Denied any focal weakness, numbness, or tingling.  CT head and MRI showed no acute abnormality.  INTERIM HISTORY/SUBJECTIVE Patient seen at bedside. Reports she was in her usual state of health when she had a fall at home. She called EMS, and they witnessed an acute onset of aphasia following the fall that lasted approximately between 10-20 minutes before resolving. Patient denies weakness prior to falling, head injury, or LOC. Patient reports during this brief episode, she had trouble getting her words out. Reports she takes Eliquis  for Afib, and she thinks she may have missed a dose of Eliquis  the day before the aphasia happened. On exam today, patient's speech is fluid without word-finding or naming difficulty. Comprehension is intact.   OBJECTIVE  CBC    Component Value Date/Time   WBC 6.1 08/25/2024 0535   RBC 4.03 08/25/2024 0535   HGB 11.6 (L) 08/25/2024 0535   HGB 11.6 03/09/2023 1050   HCT 36.4 08/25/2024 0535   HCT 36.3 03/09/2023 1050   PLT 200 08/25/2024 0535   PLT 301 03/09/2023 1050   MCV 90.3 08/25/2024 0535   MCV 94 03/09/2023 1050   MCH 28.8 08/25/2024 0535   MCHC 31.9 08/25/2024 0535   RDW 15.2 08/25/2024 0535   RDW 13.0 03/09/2023 1050   LYMPHSABS 0.9 08/24/2024 1118   LYMPHSABS 1.8 08/12/2018 1114   MONOABS 1.0 08/24/2024 1118   EOSABS 0.2 08/24/2024 1118   EOSABS 0.7 (H) 08/12/2018 1114   BASOSABS 0.0 08/24/2024 1118   BASOSABS 0.0 08/12/2018 1114    BMET    Component Value Date/Time   NA 137 08/25/2024 0535   NA 141 03/09/2023 1050   K 3.6 08/25/2024 0535   CL 96 (L) 08/25/2024 0535   CO2 26 08/25/2024 0535   GLUCOSE 120 (H) 08/25/2024 0535   BUN 23 08/25/2024 0535   BUN 31 (H) 03/09/2023 1050   CREATININE 1.18 (H) 08/25/2024 0535   CALCIUM  8.3 (L) 08/25/2024 0535    EGFR 38 (L) 03/09/2023 1050   GFRNONAA 46 (L) 08/25/2024 0535    IMAGING past 24 hours MR ABDOMEN W WO CONTRAST Result Date: 08/25/2024 CLINICAL DATA:  Left renal mass EXAM: MRI ABDOMEN WITHOUT AND WITH CONTRAST TECHNIQUE: Multiplanar multisequence MR imaging of the abdomen was performed both before and after the administration of intravenous contrast. CONTRAST:  8mL GADAVIST GADOBUTROL 1 MMOL/ML IV SOLN COMPARISON:  CT abdomen and pelvis dated 08/24/2024 FINDINGS: Lower chest: No acute findings. Partially imaged multichamber cardiomegaly. Hepatobiliary: Mildly lobulated hepatic contour. 1.9 x 1.8 cm focus of arterial enhancement within peripheral segment 3 (801:42) without washout or peripheral pseudo capsule. No bile duct dilation. Normal gallbladder. Pancreas: No mass, inflammatory changes, or other parenchymal abnormality identified. Spleen:  Within normal limits in size and appearance. Adrenals/Urinary Tract: No adrenal nodules. Exophytic T2 hyperintense cyst demonstrates diffuse wall thickening, measuring up to 4 mm arising from the posteromedial left upper lobe measuring 3.2 x 2.4 cm demonstrates central T1 hypointensity and reduced diffusivity (5:64, 550:25). No hydronephrosis. Additional bilateral subcentimeter nonenhancing cysts. Stomach/Bowel: Visualized portions within the abdomen are unremarkable. Vascular/Lymphatic: No pathologically enlarged lymph nodes identified. No abdominal aortic aneurysm demonstrated. Other: Right hemidiaphragmatic eventration. Trace perihepatic free fluid. Musculoskeletal: No suspicious bone lesions identified. IMPRESSION: 1. 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 Bosniak III masses have intermediate probability of being malignant. If not already obtained, consider seeking urology consultation. (Reference: Bosniak Classification of Cystic Renal Masses, Version 2019. Radiology  2019; 292(2): 475-488.) 2. A 1.9 x 1.8 cm focus of arterial enhancement within peripheral hepatic segment 3 without washout or peripheral pseudo capsule. If there are known risk factors for hepatocellular carcinoma, repeat imaging in 3-6 months with dedicated liver protocol MRI is recommended. 3. Trace perihepatic free fluid. Electronically Signed   By: Limin  Xu M.D.   On: 08/25/2024 11:12   MR BRAIN WO CONTRAST Result Date: 08/24/2024 CLINICAL DATA:  Neuro deficit, acute, stroke suspected. EXAM: MRI HEAD WITHOUT CONTRAST TECHNIQUE: Multiplanar, multiecho pulse sequences of the brain and surrounding structures were obtained without intravenous contrast. COMPARISON:  Head CT and CTA 08/24/2024 FINDINGS: Brain: There is no evidence of an acute infarct, mass, midline shift, or extra-axial fluid collection. A chronic microhemorrhage is noted in the left thalamus. Patchy T2 hyperintensities in the cerebral white matter and pons are nonspecific but compatible with moderate chronic small vessel ischemic disease. There is mild cerebral atrophy. Vascular: Major intracranial vascular flow voids are preserved. Skull and upper cervical spine: Unremarkable bone marrow signal. Sinuses/Orbits: Bilateral cataract extraction. Paranasal sinuses and mastoid air cells are clear. Other: None. IMPRESSION: 1. No acute intracranial abnormality. 2. Moderate chronic small vessel ischemic disease. Electronically Signed   By: Dasie Hamburg M.D.   On: 08/24/2024 18:58    Vitals:   08/25/24 0002 08/25/24 0413 08/25/24 0751 08/25/24 1214  BP: (!) 153/69 (!) 142/82 (!) 159/82 (!) 156/70  Pulse: 69 73 72 70  Resp: 18 18    Temp: 98.7 F (37.1 C) 97.9 F (36.6 C) 97.6 F (36.4 C) 98.3 F (36.8 C)  TempSrc: Oral Oral    SpO2: 100% 100% 100% 100%  Weight:      Height:         PHYSICAL EXAM General:  Alert, well-nourished, well-developed patient in no acute distress Psych:  Mood and affect appropriate for situation CV: Regular  rate and rhythm on monitor Respiratory:  Regular, unlabored respirations on room air   NEURO:  Mental Status: AA&Ox3, patient is able to give clear and coherent history Speech/Language: speech is without dysarthria or aphasia.  Naming, repetition, fluency, and comprehension intact.  Cranial Nerves:  II: PERRL. Visual fields full.  III, IV, VI: EOMI. Eyelids elevate symmetrically.  V: Sensation is intact to light touch and symmetrical to face.  VII: Face is symmetrical resting and smiling VIII: hearing intact to voice. IX, X: Palate elevates symmetrically. Phonation is normal.  KP:Dynloizm shrug 5/5. XII: tongue is midline without fasciculations. Motor: 5/5 strength to all muscle groups tested.  Tone: is normal and bulk is normal Sensation- Intact to light touch bilaterally. Extinction absent to light touch to DSS.   Coordination: FTN intact bilaterally, HKS: no ataxia in BLE.No drift.  Gait- deferred  Most Recent NIH 0    ASSESSMENT/PLAN  [name] is a 83 y.o. female with history of Afib on Eliquis , HLD, HTN admitted for acute-onset aphasia lasting approximately 20 minutes. NIH on Admission 0.   On assessment today, patient's aphasia is resolved. She is able to repeat sentences fluently with no word-finding or naming diffculty noted on exam. CT and MRI imaging are unremarkable. Patient reports missing a dose of Eliquis  a day prior to onset of aphasia. Suspect likely TIA precipitated by missing anticoagulant dose, however would also consider a  psychological / anxiety contributor to her presentation. Recommend patient continue on home Eliquis . Stroke will sign off.  Left hemispheric TIA: Cardioembolic from A-fib and remains a couple of doses of Eliquis  Code Stroke CT head: No acute abnormality.  Stable mild for age chronic white matter changes. ASPECTS 10.    CTA head & neck: No large vessel occlusion or significant large vessel stenosis in the head or neck. Mild to moderate  irregularity of distal MCA and PCA branches without discrete branch occlusion. MRI: No acute intracranial abnormality. Moderate chronic small vessel ischemic disease. 2D Echo pending LDL 53 HgbA1c 6.0 VTE prophylaxis - Eliquis  Patient taking Eliquis  (apixaban ) daily prior to admission, continued in hospital Therapy recommendations:  No follow up needed  Disposition:  Home  Hx of Stroke/TIA No prior history reported  Atrial fibrillation Home Meds: Eliquis  5 mg BID Continue telemetry monitoring  Hypertension Home meds: furosemide  20 mg daily Stable, elevated Normotension <130/80   Hyperlipidemia Home meds:  atorvastatin  40 mg qhs, resumed in hospital LDL 53, goal < 70 Continue statin at discharge  Diabetes type II Controlled Home meds:  Metformin  500 mg daily HgbA1c 6.0, goal < 7.0 CBGs SSI  Other Stroke Risk Factors Coronary artery disease   Hospital day # 0  I have personally obtained history,examined this patient, reviewed notes, independently viewed imaging studies, participated in medical decision making and plan of care.ROS completed by me personally and pertinent positives fully documented  I have made any additions or clarifications directly to the above note. Agree with note above.  Patient had a fall at home and EMS noticed transient episode of expressive aphasia possibly left hemispheric TIA due to underlying A-fib and having missed 1 or 2 doses of Eliquis .  Patient counseled tomorrow missed any doses.  Echocardiogram results.  Aggressive risk factor modification.  Stroke team will sign off.  Follow-up as an outpatient in stroke clinic in 2 months   I personally spent a total of 50 minutes in the care of the patient today including getting/reviewing separately obtained history, performing a medically appropriate exam/evaluation, counseling and educating, placing orders, referring and communicating with other health care professionals, documenting clinical information  in the EHR, independently interpreting results, and coordinating care.        Eather Popp, MD Medical Director [facility] Pager: [phone] 08/25/2024 2:00 PM   To contact Stroke Continuity provider, please refer to [email or web address]. After hours, contact General Neurology

## 2024-09-11 ENCOUNTER — Other Ambulatory Visit: Payer: Self-pay

## 2024-09-16 ENCOUNTER — Other Ambulatory Visit: Payer: Self-pay | Admitting: Cardiology

## 2024-09-27 ENCOUNTER — Ambulatory Visit (INDEPENDENT_AMBULATORY_CARE_PROVIDER_SITE_OTHER): Payer: Medicare HMO

## 2024-09-27 DIAGNOSIS — I4819 Other persistent atrial fibrillation: Secondary | ICD-10-CM | POA: Diagnosis not present

## 2024-09-28 LAB — CUP PACEART REMOTE DEVICE CHECK
Battery Remaining Longevity: 156 mo
Battery Remaining Percentage: 100 %
Brady Statistic RA Percent Paced: 0 %
Brady Statistic RV Percent Paced: 100 %
Date Time Interrogation Session: 20251112045300
Implantable Lead Connection Status: 753985
Implantable Lead Connection Status: 753985
Implantable Lead Implant Date: 20111121
Implantable Lead Implant Date: 20111121
Implantable Lead Location: 753859
Implantable Lead Location: 753860
Implantable Lead Model: 4469
Implantable Lead Model: 4470
Implantable Lead Serial Number: 548226
Implantable Lead Serial Number: 687643
Implantable Pulse Generator Implant Date: 20240429
Lead Channel Impedance Value: 365 Ohm
Lead Channel Setting Pacing Amplitude: 2.5 V
Lead Channel Setting Pacing Pulse Width: 0.4 ms
Lead Channel Setting Sensing Sensitivity: 3.5 mV
Pulse Gen Serial Number: 662700
Zone Setting Status: 755011

## 2024-10-02 NOTE — Progress Notes (Unsigned)
 Cardiology Office Note    Date:  10/03/2024  ID:  WONDER DONAWAY, DOB 08-Oct-1941, MRN [id number] Cardiologist: Jayson Sierras, MD Electrophysiologist:  Eulas FORBES Furbish, MD { :  History of Present Illness:    Brandi Hunter is a 83 y.o. 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  She was last examined by Almarie Crate, NP in 06/2024 and her SBP had been elevated in the 140's to 170's when checked at home. Toprol -XL was increased to 50 mg twice daily and it was recommended she return in 2 weeks for a BP check.  In the interim, she was admitted to [facility] in 08/2024 for a TIA. MRI during admission showed no acute intracranial abnormalities. By review of Neurology notes, there was concern that she had missed doses of Eliquis  prior to admission. Was also diagnosed with and treated for diverticulitis. At the time of discharge, it appears that Cardizem  CD, Lasix , Meclizine and Metformin  were paused until she followed up with her PCP.  In talking with the patient and her friend today, she reports having baseline dyspnea on exertion and fatigue which has overall been unchanged since her admission. She is on supplemental oxygen  24/7 and uses CPAP at night. She denies any recent dizziness  No palpitations, exertional chest pain, orthopnea or PND. She did have lower extremity edema following her hospitalization and was prescribed Lasix  20 mg twice daily by her PCP along with potassium supplementation. Says that symptoms have now resolved. She has been checking her blood pressure at home and this has been variable with SBP from the 130's to 170's. Typically takes this around the time that she  takes her morning medications.  Studies Reviewed:   EKG: EKG is not ordered today.  Echocardiogram: 08/25/2024 IMPRESSIONS     1. Left ventricular ejection fraction, by estimation, is 60 to 65%. The  left ventricle has normal function. The left ventricle has no regional  wall motion abnormalities. There is mild left ventricular hypertrophy.  Left ventricular diastolic parameters  are consistent with Grade II diastolic dysfunction (pseudonormalization).   2. Right ventricular systolic function is normal. The right ventricular  size is normal. There is mildly elevated pulmonary artery systolic  pressure.   3. Left atrial size was severely dilated.   4. A small pericardial effusion is present. There is no evidence of  cardiac tamponade.   5. The mitral valve is abnormal. Moderate mitral valve regurgitation. No  evidence of mitral stenosis. There is mild holosystolic prolapse of  anterior leaflet of the mitral valve.   6. The aortic valve is tricuspid. Aortic valve regurgitation is not  visualized. No aortic stenosis is present.   7. The inferior vena cava is normal in size with greater than 50%  respiratory variability, suggesting right atrial pressure of 3 mmHg.   Comparison(s): No significant change from prior study.    Risk Assessment/Calculations:    CHA2DS2-VASc Score = 8  This indicates a 10.8% annual risk of stroke. The patient's score is based upon: CHF History: 0 HTN History: 1 Diabetes History: 1 Stroke History: 2 Vascular Disease History: 1 Age Score: 2 Gender Score: 1    Physical Exam:   VS:  BP 126/74 (BP Location: Left Arm, Cuff Size: Large)   Pulse 70   Ht 5' 6 (1.676 m)   Wt 180 lb (81.6 kg)   SpO2 96%   BMI 29.05 kg/m    Wt Readings from Last 3 Encounters:  10/03/24 180 lb (81.6 kg)  08/24/24 175 lb 12.8 oz (79.7 kg)  07/20/24 182 lb 12.8 oz (82.9 kg)     GEN: Pleasant, elderly female appearing in no acute distress NECK: No JVD; No carotid  bruits CARDIAC: RRR, no murmurs, rubs, gallops RESPIRATORY:  Clear to auscultation without rales, wheezing or rhonchi  ABDOMEN: Appears non-distended. No obvious abdominal masses. EXTREMITIES: No clubbing or cyanosis. No pitting edema.  Distal pedal pulses are 2+ bilaterally.   Assessment and Plan:   1. Persistent atrial fibrillation (HCC) - She denies any recent palpitations and heart rate is well-controlled in the 70's during today's visit. Will continue with Toprol -XL 50 mg twice daily for rate-control. - No reports of active bleeding. She is currently on Eliquis  2.5 mg twice daily for anticoagulation and her only indication for reduced dosing at this time is her age as creatinine was at 1.18 when checked on 08/25/2024 and at 1.08 when checked on 09/15/2024. Therefore, will increase dosing to the appropriate dose at this time which is 5 mg twice daily. Will plan for a follow-up BMET in 3-4 weeks for reassessment.   2. Coronary artery disease involving native coronary artery of native heart without angina pectoris - Prior cardiac catheterization in 2019 showed 90% ostial OM stenosis with medical management recommended. She has baseline dyspnea on exertion but denies any chest pain. Recent echocardiogram showed a preserved EF of 60 to 65% with no regional wall motion abnormalities as outlined above. - Continue Atorvastatin  40 mg daily (LDL 53 when checked in 08/2024) and Toprol -XL 50 mg twice daily. She is not on ASA given the need for anticoagulation.  3. 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 - She does have a PPM in place which is followed by Dr. Nancey. Device was functioning normally by most recent interrogation on 09/27/2024.  4. Essential hypertension - BP is well-controlled at 126/74 during today's visit and was similar to her home blood pressure cuff when checked in clinic. For now, will continue Toprol -XL 50 mg twice daily. She was encouraged to  check her blood pressure at different times during the day. If this remains elevated, can restart Cardizem  CD 120 mg daily as she was on this prior to admission.  5. Lower Extremity Edema - She does not have any pitting edema on examination today. Will reduce Lasix  to her prior dose of 20 mg daily and K-dur to 20 mEq daily. Repeat BMET in 3-4 weeks.    Signed, Laymon CHRISTELLA Qua, PA-C

## 2024-10-02 NOTE — Progress Notes (Signed)
 Remote PPM Transmission

## 2024-10-03 ENCOUNTER — Encounter: Payer: Self-pay | Admitting: Student

## 2024-10-03 ENCOUNTER — Ambulatory Visit: Attending: Student | Admitting: Student

## 2024-10-03 VITALS — BP 126/74 | HR 70 | Ht 66.0 in | Wt 180.0 lb

## 2024-10-03 DIAGNOSIS — Z79899 Other long term (current) drug therapy: Secondary | ICD-10-CM

## 2024-10-03 DIAGNOSIS — R42 Dizziness and giddiness: Secondary | ICD-10-CM

## 2024-10-03 DIAGNOSIS — N1832 Chronic kidney disease, stage 3b: Secondary | ICD-10-CM

## 2024-10-03 DIAGNOSIS — I10 Essential (primary) hypertension: Secondary | ICD-10-CM

## 2024-10-03 DIAGNOSIS — R600 Localized edema: Secondary | ICD-10-CM

## 2024-10-03 DIAGNOSIS — I2510 Atherosclerotic heart disease of native coronary artery without angina pectoris: Secondary | ICD-10-CM

## 2024-10-03 DIAGNOSIS — I441 Atrioventricular block, second degree: Secondary | ICD-10-CM

## 2024-10-03 DIAGNOSIS — I4819 Other persistent atrial fibrillation: Secondary | ICD-10-CM

## 2024-10-03 MED ORDER — FUROSEMIDE 20 MG PO TABS: 20.0000 mg | ORAL_TABLET | Freq: Every day | ORAL | 3 refills | Status: DC

## 2024-10-03 MED ORDER — POTASSIUM CHLORIDE CRYS ER 20 MEQ PO TBCR: 20.0000 meq | EXTENDED_RELEASE_TABLET | Freq: Every day | ORAL | 3 refills | Status: DC

## 2024-10-03 MED ORDER — APIXABAN 5 MG PO TABS: 5.0000 mg | ORAL_TABLET | Freq: Two times a day (BID) | ORAL | 3 refills | Status: AC

## 2024-10-03 NOTE — Patient Instructions (Addendum)
 Medication Instructions:   INCREASE  Eliquis  to 5 mg Twice a day  DECREASE Lasix  to 20 mg Daily  DECREASE potassium to 20 meq daily  Labwork: BMET in 1 month at [facility]  Testing/Procedures: None today  Follow-Up: 3 months Dr.McDowell or Brandi Bufkin,PA-C  Any Other Special Instructions Will Be Listed Below (If Applicable).  Keep daily BP log, take reading 2 hours after taking medication  If you need a refill on your cardiac medications before your next appointment, please call your pharmacy.

## 2024-10-16 ENCOUNTER — Ambulatory Visit: Payer: Self-pay | Admitting: Cardiovascular Disease

## 2024-11-02 ENCOUNTER — Encounter: Payer: Self-pay | Admitting: Student

## 2024-11-03 ENCOUNTER — Ambulatory Visit: Payer: Self-pay | Admitting: Student

## 2024-11-03 DIAGNOSIS — Z79899 Other long term (current) drug therapy: Secondary | ICD-10-CM

## 2024-11-03 LAB — BASIC METABOLIC PANEL WITH GFR
BUN/Creatinine Ratio: 26 (ref 12–28)
BUN: 45 mg/dL — ABNORMAL HIGH (ref 8–27)
CO2: 30 mmol/L — ABNORMAL HIGH (ref 20–29)
Calcium: 9.9 mg/dL (ref 8.7–10.3)
Chloride: 96 mmol/L (ref 96–106)
Creatinine, Ser: 1.72 mg/dL — ABNORMAL HIGH (ref 0.57–1.00)
Glucose: 121 mg/dL — ABNORMAL HIGH (ref 70–99)
Potassium: 3.6 mmol/L (ref 3.5–5.2)
Sodium: 140 mmol/L (ref 134–144)
eGFR: 30 mL/min/1.73 — ABNORMAL LOW

## 2024-11-03 MED ORDER — FUROSEMIDE 20 MG PO TABS: 20.0000 mg | ORAL_TABLET | ORAL | 3 refills | Status: AC

## 2024-11-03 MED ORDER — POTASSIUM CHLORIDE CRYS ER 20 MEQ PO TBCR: 20.0000 meq | EXTENDED_RELEASE_TABLET | ORAL | 3 refills | Status: AC

## 2024-11-03 NOTE — Telephone Encounter (Signed)
 The patient has been notified of the result and verbalized understanding.  All questions (if any) were answered. Bernett Dorothyann LABOR, RN 11/03/2024 8:15 AM    Labs to be done at [facility]

## 2024-11-03 NOTE — Telephone Encounter (Signed)
-----   Message from Laymon CHRISTELLA Qua sent at 11/03/2024  8:03 AM EST ----- Please let the patient know that her sodium and potassium are within a normal range but kidney function has worsened as compared to 08/2024. Creatinine was previously at 1.18 and is now at 1.72. Has  she been staying hydrated? Has her weight been stable or continuing to decline? If no recurrent fluid issues, I would reduce Lasix  to 20 mg every other day and only take potassium on the days that  she takes Lasix . Recheck BMET in 2 weeks.

## 2024-11-04 ENCOUNTER — Ambulatory Visit: Payer: Self-pay | Admitting: Student

## 2024-11-15 ENCOUNTER — Telehealth (HOSPITAL_BASED_OUTPATIENT_CLINIC_OR_DEPARTMENT_OTHER): Payer: Self-pay

## 2024-11-15 NOTE — Telephone Encounter (Signed)
 Pharmacy please advise on holding Eliquis  prior to  Lumbar ESI  (L5-S1)  scheduled for 12/18/2024. Thank you.

## 2024-11-15 NOTE — Telephone Encounter (Signed)
" ° ° °  She denied any recent anginal symptoms and heart rate was well-controlled in regards to her atrial fibrillation. She does not require further cardiac testing prior to her procedure. She is on continuous oxygen  supplementation per Pulmonology for COPD, therefore I would recommend that they obtain Pulmonology clearance if requiring anesthesia.  Signed, Laymon CHRISTELLA Qua, PA-C 11/15/2024, 11:42 AM Pager: [phone]  "

## 2024-11-15 NOTE — Telephone Encounter (Signed)
 Patient with diagnosis of atrial fibrillation on Eliquis  for anticoagulation.    Procedure:  Lumbar ESI ( L5-S1)   Date of Surgery:  Clearance 12/18/24       CHA2DS2-VASc Score = 8   This indicates a 10.8% annual risk of stroke. The patient's score is based upon: CHF History: 0 HTN History: 1 Diabetes History: 1 Stroke History: 2 Vascular Disease History: 1 Age Score: 2 Gender Score: 1    CrCl 33 Platelet count 200  Chart indicates patient had TIA 08/24/2024  Patient has not had an Afib/aflutter ablation in the last 3 months, DCCV within the last 4 weeks or a watchman implanted in the last 45 days   Because of high CHADS2-VASc score and < 6 months since TIA, will review with primary cardiologist about hold.  Because of spinal procedure and low CrCl, hold would need to be 3 days.  **This guidance is not considered finalized until pre-operative APP has relayed final recommendations.**

## 2024-11-15 NOTE — Telephone Encounter (Signed)
"  ° °  Patient Name: Brandi Hunter  DOB: 1941-07-19 MRN: [id number]  Primary Cardiologist: Jayson Sierras, MD  Chart reviewed as part of pre-operative protocol coverage. Given past medical history and time since last visit, based on ACC/AHA guidelines, Brandi Hunter is at acceptable risk for the planned procedure without further cardiovascular testing.   She denied any recent anginal symptoms and heart rate was well-controlled in regards to her atrial fibrillation. She does not require further cardiac testing prior to her procedure. She is on continuous oxygen  supplementation per Pulmonology for COPD, therefore I would recommend that they obtain Pulmonology clearance if requiring anesthesia.     Because of high CHADS2-VASc score and < 6 months since TIA, will review with primary cardiologist about hold. Because of spinal procedure and low CrCl, hold would need to be 3 days   The patient was advised that if she develops new symptoms prior to surgery to contact our office to arrange for a follow-up visit, and she verbalized understanding.  I will route this recommendation to the requesting party via Epic fax function and remove from pre-op pool.  Please call with questions.  Lamarr Satterfield, NP 11/15/2024, 4:35 PM  "

## 2024-11-15 NOTE — Progress Notes (Signed)
 Chief Complaint:  Chief Complaint  Patient presents with   Follow-up     Assessment: 1. Lumbar foraminal stenosis      2. Neural foraminal stenosis of cervical spine  MRI Spine Cervical WO Contrast      Plan:   Would like to transfer to [place] office.   1.  UDS: n/a  2. Injections: Plan for L5-S1 interlaminar ESI as well as CESI, both in CARM, 6 weeks apart.  Patient is on Eliquis  therefore we will obtain to hold for 3 days for both procedures.  Also plan for MRI cervical spine to assess central canal stenosis.  Patient's PCP is aware of continual steroid steroid injection.  3. Adjuvant Medications:  COPD and OSA - would need to be very careful with sedating medications.  Specialized treatments: I have discussed the importance of continuing home exercises.  4. Opioid medications: None prescribed - not a good candidate 2/2 comorbid diseases, increase risk for falls  5. Follow up: Follow up for L5-S1 interlaminar ESI, CESI .  6. The patients blood pressure was 136/79   Patient's pain was assessed, documented as positive, unless stated in the HPI, and a follow up plan has been documented in the Plan. Patient's medication list was reviewed and updated if applicable.  Body Mass Index (BMI) screening was documented and if the patients BMI was greater than 25 or less than 18.5, the patient was asked to follow up with their PCP for a full assessment regarding weight management. If Fluoroscopy was used during a procedure, the radiation exposure time and number of images were documented within the record.    History of Present Illness:  Brandi Hunter is a 83 y.o.year old Caucasian female with a history of Chronic neck and low back pain. Of note, she has hx CHF, cardiac pacemaker, COPD and OSA      Interval history: 11/15/2024   Brandi Hunter returns to the clinic today having last been seen on 12/27/2023.  She has been having fluctuating pain in the lower back with radicular  symptoms down bilateral lower extremities diffusely, as well as in her neck.  Her epidural injections tend to keep her symptoms rather stable and she would like to continue on with these injections.   Improvement from treatments: See below under procedures Side effects from medicines: Memory problems with gabapentin ? Activity Level- _adequate [employer]ber[employer] Behavior- _nil  Procedures: 03/24/2021 -CESI -greater than 80% relief 04/02/2022 -CESI - greater than 80% 07/28/22 -L5-S1 interlaminar ESI - excellent relief 06/15/2023 -CESI - 80% 07/22/2023 -L5-S1 interlaminar ESI - 90% 11/01/23 - L5-S1 ESI 100%  Images: No new images to review.  Past Medical History:  Past Medical History:  Diagnosis Date   Adjustment disorder with depressed mood    History of Grief Reaction     Allergy    Anxiety 1967   Asthma (*) 1960   Atrial fibrillation (*) Sep 2011   Cataracts, bilateral 2010?   Had surgery on both eyes   Coronary artery disease 2011   Diabetes mellitus (*)    Disturbance of skin sensation    History of Tingling (Paresthesia)   Dizziness and giddiness    History of Vertigo   Emphysema of lung (*)    GERD (gastroesophageal reflux disease)    Myocardial infarction (*) 2011   Obstructive chronic bronchitis with exacerbation (*)    History of Chronic Bronchitis With Acute Exacerbation   Obstructive chronic bronchitis with exacerbation (*)    History of Chronic Bronchitis With  Acute Exacerbation     Osteoarthrosis, unspecified whether generalized or localized, lower leg    History of Osteoarthritis Of The Knee     Other abnormal glucose    History of Hyperglycemia   Tobacco use disorder    History of Nicotine  Dependence   Ulcer    Unspecified asthma(493.90)    History of Asthmatic Bronchitis   Unspecified essential hypertension    History of Hypertension    Past Surgical History:  Past Surgical History:  Procedure Laterality Date   Appendectomy  1973    Cardiac pacemaker placement  09/16/2010   Cataract extraction  December 2011/January 2012   Eye surgery  2010 ?   And 2021   Hysterectomy  11/16/1970   Knee arthroscopy     right knee 2002, left knee 12/2010   Other surgical history     History of Knee Surgery   Other surgical history     History of Rectocele Repair   Other surgical history     History of Tonsillectomy   Other surgical history     History of Oophorectomy - R oophorectomy   Other surgical history     History of Esophagogastroduodenoscopy   Other surgical history     History of Pacemaker Placement   Ovary surgery  11/17/1971   Repair rectocele  11/17/1999   Tonsillectomy and adenoidectomy      Allergies:  Allergies  Allergen Reactions   Bee Venom Swelling   Latex Swelling    Some types    Medications History:  No outpatient medications have been marked as taking for the 11/15/24 encounter (Office Visit) with Kao Ly, NP.    Social History:  Social History   Socioeconomic History   Marital status: Widowed  Tobacco Use   Smoking status: Former    Current packs/day: 0.00    Average packs/day: 1.5 packs/day for 55.9 years (83.8 ttl pk-yrs)    Types: Cigarettes    Start date: 08/01/1955    Quit date: 06/20/2011    Years since quitting: 13.4    Passive exposure: Past   Smokeless tobacco: Never   Tobacco comments:    Smoked off & on for 50 + years..have been off cigarettes for 5 yrs and start back several times.....  Vaping Use   Vaping status: Never Used  Substance and Sexual Activity   Alcohol use: Never    Comment: Not on a regular basis   Drug use: No   Sexual activity: Not Currently    Partners: Male    Birth control/protection: Post-menopausal, Surgical    Comment: Not since 2009  Social History Narrative   Alcohol Use - 1 glass wine/week   Marital History - Currently Married   Caregiver - Spouse with end stage lung disease   Denied - History of Drug Use   Marital  History - Widowed   Sexual Activity Denied    Family History:  Family History  Problem Relation Name Age of Onset   Arthritis Sister Abel Brain    Cancer Brother Carlin Frost     Paternal Grandmother Miranda T Landreth     Sister [place]    Diabetes Brother Carlin Frost     Sister [place]     Son Prentiss Riddles    Hearing loss Mother Zelda SQUIBB. Lemons     Sister Abel Brain    Heart disease Father Corinthia KANDICE Frost     Mother Zelda SQUIBB. Lemons     Sister [place]  Son Prentiss Riddles    Hyperlipidemia Son Prentiss Riddles     Son Corinthia KANDICE Riddles    Hypertension Son Prentiss Riddles     Son Corinthia KANDICE Riddles    Other Other          Fraternal history of Type 1 Diabetes Mellitus    Other          Paternal history of Congestive Heart Failure    Other          Maternal history of Congestive Heart Failure    Other          Family history of Congestive Heart Failure - d.72    Other          Paternal grandmother's history of Cancer    Other          Paternal grandfather's history of Paternal Grandfather Is Deceased    Other          Maternal history of Family Health Status Of Mother - Good    Other          Maternal grandmother's history of Maternal Grandmother Is Deceased    Other          Maternal grandfather's history of Congestive Heart Failure - d.40s    Other          Fraternal history of Type 2 Diabetes Mellitus    Other          Sororal history of Family Health Status Of Sister - Good    Other          Son's history of Hyperlipidemia    Other          Son's history of Alcohol Abuse    Other          Son's history of Bipolar Disorder   Prostate cancer Brother Carlin Frost    Stroke Brother Carlin Frost     Father Corinthia KANDICE Frost     Review of Systems:   Review of Systems  Musculoskeletal:  Positive for arthralgias, myalgias and neck pain.    Denies new numbness or weakness     Denies current chest pain, SOB,  Oversedation        Physical Exam:   BP: 136/79 Resp:      Pulse: 73        SPO2:     General:  Alert and oriented, No acute distress.   Eye:  Pupils are equal, round and reactive to light, Normal conjunctiva.   Respiratory:  Respirations are non-labored, Symmetrical chest wall expansion.   NECK:  Cervical flexion 50, cervical extension 40, cervical rotation 60 and equal bilaterally. Lateral bend 30 and equal bilaterally. . There is concordant pain in the paracervical area with these maneuvers. No pain with palpation of the cervical spinous processes. There is pain with palpation of the cervical facets. Negative  Spurling sign. paracervical muscle spasm  Cardiovascular:  Normal rate, Normal peripheral perfusion.   Integumentary:  Warm, Dry, Pink, No rash.     Cognition and Speech:  Oriented, Speech clear and coherent.   Psychiatric:  Cooperative, Appropriate mood & affect, Normal judgment.  Neuro: muscle tone and strength normal and symmetric, reflexes normal and symmetric, and sensation grossly normal , flexed gait with rolling gait Back: Facet loading maneuvers are positive bilaterally.  Kao Ly, NP 11/15/2024 / 8:48 AM

## 2024-11-15 NOTE — Telephone Encounter (Signed)
"  ° °  Pre-operative Risk Assessment    Patient Name: Brandi Hunter  DOB: 1941-09-07 MRN: [id number]   Date of last office visit: 10/03/24 with Johnson Date of next office visit: 01/03/25 with [place]  Request for Surgical Clearance    Procedure:  Lumbar ESI ( L5-S1)  Date of Surgery:  Clearance 12/18/24                                 Surgeon:  NA Surgeon's Group or Practice Name:  [facility] [place] Phone number:  [phone] Fax number:  [phone]   Type of Clearance Requested:   - Medical  - Pharmacy:  Hold Apixaban  (Eliquis ) 3 days prior   Type of Anesthesia:  Not Indicated   Additional requests/questions:    Brandi Hunter   11/15/2024, 11:25 AM   "

## 2024-11-15 NOTE — Telephone Encounter (Signed)
 Brittany  You saw this patient on 10/03/2024. Per protocol we request that you comment on his cardiac risk to proceed with  Lumbar ESI ( L5-S1)  since it has been less than 2 months since evaluated in the office. I have reached out to pharmacy concerning holding Eliquis  for 3 days. Please send your comment to P CV Pre-Op Pool.  Thank you, Lamarr Satterfield DNP, ANP, AACC.

## 2024-11-17 NOTE — Telephone Encounter (Signed)
"  ° °  Patient Name: Brandi Hunter  DOB: 1941/08/17 MRN: [id number]  Primary Cardiologist: Jayson Sierras, MD  Chart reviewed as part of pre-operative protocol coverage. Given past medical history and time since last visit, based on ACC/AHA guidelines, Brandi Hunter is at acceptable risk for the planned procedure without further cardiovascular testing.   Per [name] PA-C: She denied any recent anginal symptoms and heart rate was well-controlled in regards to her atrial fibrillation. She does not require further cardiac testing prior to her procedure. She is on continuous oxygen  supplementation per Pulmonology for COPD, therefore I would recommend that they obtain Pulmonology clearance if requiring anesthesia   Per Dr. Sierras:  May hold Eliquis  3 days prior to lumbar procedure as requested.  I will route this recommendation to the requesting party via Epic fax function and remove from pre-op pool.  Please call with questions.  Brandi Villalona E Flecia Shutter, NP 11/17/2024, 4:30 PM  "

## 2024-11-20 ENCOUNTER — Telehealth: Payer: Self-pay | Admitting: Cardiology

## 2024-11-20 NOTE — Telephone Encounter (Signed)
 11/17/24 creatinine 1.69, BUN 30, GFR 30

## 2024-11-20 NOTE — Telephone Encounter (Signed)
 I spoke with Brandi Hunter at [facility]. Patient had labs done at [facility] on 11/17/24  Please review and advise  Patient's Lasix  was reduced to 20 mg every other day on 10/03/25 office visit

## 2024-11-20 NOTE — Telephone Encounter (Signed)
 Patient tells me her weight I week ago 11/13/24 was 182.8 lbs, today is 188.3 lbs  She says she felt SOB today which is the first time she has noted this. She is hesitant to decrease her lasix .   Please advise.

## 2024-11-20 NOTE — Telephone Encounter (Signed)
 Pt c/o swelling/edema: STAT if pt has developed SOB within 24 hours  If swelling, where is the swelling located? Ankles and feet   How much weight have you gained and in what time span? 1/1 183.2 1/5 188.3  Have you gained 2 pounds in a day or 5 pounds in a week? Yes   Do you have a log of your daily weights (if so, list)? Yrs   Are you currently taking a fluid pill? Yes   Are you currently SOB? Yes   Have you traveled recently in a car or plane for an extended period of time?  Brandi Hunter at [facility] calling because this patient has been having swelling with weight increase and SOB. Pt not with nurse at time of call. Hunter feels she may need a lasix  today. Please advise.

## 2024-11-21 NOTE — Telephone Encounter (Signed)
 Spoke with pt who states that she did receive food from a family member. Pt will reduce sodium intake and take lasix  daily for the next 3-4 days.

## 2024-11-22 ENCOUNTER — Other Ambulatory Visit: Payer: Self-pay

## 2024-11-22 ENCOUNTER — Emergency Department (HOSPITAL_COMMUNITY)

## 2024-11-22 ENCOUNTER — Emergency Department (HOSPITAL_COMMUNITY)
Admission: EM | Admit: 2024-11-22 | Discharge: 2024-11-27 | Disposition: A | Discharge status: Rehabilitation Facility | Attending: Emergency Medicine | Admitting: Emergency Medicine

## 2024-11-22 DIAGNOSIS — S0990XA Unspecified injury of head, initial encounter: Secondary | ICD-10-CM | POA: Diagnosis not present

## 2024-11-22 DIAGNOSIS — W01198A Fall on same level from slipping, tripping and stumbling with subsequent striking against other object, initial encounter: Secondary | ICD-10-CM | POA: Insufficient documentation

## 2024-11-22 DIAGNOSIS — R2689 Other abnormalities of gait and mobility: Secondary | ICD-10-CM | POA: Diagnosis not present

## 2024-11-22 DIAGNOSIS — S81012A Laceration without foreign body, left knee, initial encounter: Secondary | ICD-10-CM | POA: Insufficient documentation

## 2024-11-22 DIAGNOSIS — S42292A Other displaced fracture of upper end of left humerus, initial encounter for closed fracture: Secondary | ICD-10-CM | POA: Insufficient documentation

## 2024-11-22 DIAGNOSIS — S51811A Laceration without foreign body of right forearm, initial encounter: Secondary | ICD-10-CM | POA: Diagnosis not present

## 2024-11-22 DIAGNOSIS — Z79899 Other long term (current) drug therapy: Secondary | ICD-10-CM | POA: Diagnosis not present

## 2024-11-22 DIAGNOSIS — Z91040 Latex allergy status: Secondary | ICD-10-CM | POA: Insufficient documentation

## 2024-11-22 DIAGNOSIS — W19XXXA Unspecified fall, initial encounter: Secondary | ICD-10-CM

## 2024-11-22 DIAGNOSIS — Z7901 Long term (current) use of anticoagulants: Secondary | ICD-10-CM | POA: Insufficient documentation

## 2024-11-22 DIAGNOSIS — S4992XA Unspecified injury of left shoulder and upper arm, initial encounter: Secondary | ICD-10-CM | POA: Diagnosis present

## 2024-11-22 LAB — CBC WITH DIFFERENTIAL/PLATELET
Abs Immature Granulocytes: 0.02 K/uL (ref 0.00–0.07)
Basophils Absolute: 0 K/uL (ref 0.0–0.1)
Basophils Relative: 1 %
Eosinophils Absolute: 0 K/uL (ref 0.0–0.5)
Eosinophils Relative: 0 %
HCT: 33.9 % — ABNORMAL LOW (ref 36.0–46.0)
Hemoglobin: 10.6 g/dL — ABNORMAL LOW (ref 12.0–15.0)
Immature Granulocytes: 0 %
Lymphocytes Relative: 15 %
Lymphs Abs: 1 K/uL (ref 0.7–4.0)
MCH: 29.7 pg (ref 26.0–34.0)
MCHC: 31.3 g/dL (ref 30.0–36.0)
MCV: 95 fL (ref 80.0–100.0)
Monocytes Absolute: 0.7 K/uL (ref 0.1–1.0)
Monocytes Relative: 11 %
Neutro Abs: 4.6 K/uL (ref 1.7–7.7)
Neutrophils Relative %: 73 %
Platelets: 233 K/uL (ref 150–400)
RBC: 3.57 MIL/uL — ABNORMAL LOW (ref 3.87–5.11)
RDW: 15.2 % (ref 11.5–15.5)
WBC: 6.3 K/uL (ref 4.0–10.5)
nRBC: 0 % (ref 0.0–0.2)

## 2024-11-22 LAB — BASIC METABOLIC PANEL WITH GFR
Anion gap: 12 (ref 5–15)
BUN: 36 mg/dL — ABNORMAL HIGH (ref 8–23)
CO2: 30 mmol/L (ref 22–32)
Calcium: 9.7 mg/dL (ref 8.9–10.3)
Chloride: 101 mmol/L (ref 98–111)
Creatinine, Ser: 1.64 mg/dL — ABNORMAL HIGH (ref 0.44–1.00)
GFR, Estimated: 31 mL/min — ABNORMAL LOW
Glucose, Bld: 223 mg/dL — ABNORMAL HIGH (ref 70–99)
Potassium: 4 mmol/L (ref 3.5–5.1)
Sodium: 143 mmol/L (ref 135–145)

## 2024-11-22 MED ORDER — ALBUTEROL SULFATE HFA 108 (90 BASE) MCG/ACT IN AERS: 2.0000 | INHALATION_SPRAY | RESPIRATORY_TRACT | Status: DC | PRN

## 2024-11-22 MED ORDER — APIXABAN 2.5 MG PO TABS
2.5000 mg | ORAL_TABLET | Freq: Two times a day (BID) | ORAL | Status: DC
  Administered 2024-11-22 – 2024-11-27 (×10): 2.5 mg via ORAL
  Filled 2024-11-22 (×10): qty 1

## 2024-11-22 MED ORDER — HYDROCODONE-ACETAMINOPHEN 5-325 MG PO TABS
1.0000 | ORAL_TABLET | ORAL | Status: DC | PRN
  Administered 2024-11-22 – 2024-11-27 (×11): 1 via ORAL
  Filled 2024-11-22 (×11): qty 1

## 2024-11-22 MED ORDER — HYDROCODONE-ACETAMINOPHEN 5-325 MG PO TABS: 1.0000 | ORAL_TABLET | Freq: Four times a day (QID) | ORAL | 0 refills | Status: AC | PRN

## 2024-11-22 MED ORDER — POVIDONE-IODINE 10 % EX SOLN
CUTANEOUS | Status: AC
  Filled 2024-11-22: qty 14.8

## 2024-11-22 MED ORDER — ATORVASTATIN CALCIUM 40 MG PO TABS
40.0000 mg | ORAL_TABLET | Freq: Every day | ORAL | Status: DC
  Administered 2024-11-22 – 2024-11-26 (×5): 40 mg via ORAL
  Filled 2024-11-22 (×5): qty 1

## 2024-11-22 MED ORDER — LIDOCAINE HCL (PF) 1 % IJ SOLN
30.0000 mL | Freq: Once | INTRAMUSCULAR | Status: AC
  Administered 2024-11-22: 30 mL
  Filled 2024-11-22: qty 30

## 2024-11-22 MED ORDER — FUROSEMIDE 40 MG PO TABS
20.0000 mg | ORAL_TABLET | ORAL | Status: DC
  Administered 2024-11-22: 20 mg via ORAL
  Filled 2024-11-22: qty 1

## 2024-11-22 MED ORDER — APIXABAN 5 MG PO TABS: 5.0000 mg | ORAL_TABLET | Freq: Two times a day (BID) | ORAL | Status: DC

## 2024-11-22 MED ORDER — DULOXETINE HCL 30 MG PO CPEP
30.0000 mg | ORAL_CAPSULE | Freq: Every day | ORAL | Status: DC
  Administered 2024-11-22 – 2024-11-26 (×5): 30 mg via ORAL
  Filled 2024-11-22 (×5): qty 1

## 2024-11-22 MED ORDER — GABAPENTIN 100 MG PO CAPS
200.0000 mg | ORAL_CAPSULE | Freq: Every day | ORAL | Status: DC
  Administered 2024-11-22 – 2024-11-26 (×5): 200 mg via ORAL
  Filled 2024-11-22 (×5): qty 2

## 2024-11-22 MED ORDER — FENOFIBRATE 160 MG PO TABS
160.0000 mg | ORAL_TABLET | Freq: Every evening | ORAL | Status: DC
  Administered 2024-11-22 – 2024-11-26 (×5): 160 mg via ORAL
  Filled 2024-11-22 (×5): qty 1

## 2024-11-22 NOTE — Discharge Instructions (Addendum)
 Brandi Hunter was seen in the emergency department 1/8 after a fall.  She was diagnosed with a left proximal humerus fracture and a left knee laceration.  The knee laceration was repaired and sutures will need to be removed in 6 to 8 days.  She uses a walker at baseline and due to the humerus fracture is unable to use her left arm for weightbearing to assist with the walker use.  She was not safe to return home and so was evaluated by physical therapy occupational therapy and transitions of care and was felt to be appropriate for discharge to rehabilitation.  She will need to follow-up outpatient with orthopedic provider, Dr Onesimo.

## 2024-11-22 NOTE — ED Notes (Signed)
 Pt aware of SW TOC process and she is willing to stay however long she needs to to get into rehab.

## 2024-11-22 NOTE — ED Notes (Addendum)
 Son and pt states she lives alone and not sure she can manage this by herself. PA aware and consult for SW done.

## 2024-11-22 NOTE — ED Provider Notes (Signed)
 Patient does not feel like she can take care of herself at home.  She does not feel like her family can take care of her.  Patient is requesting to go into a rehab facility.  Transitions of care consulted PT and OT consulted.   Flint Sonny POUR, PA-C 11/22/24 1705    Charlyn Sora, MD 11/23/24 1438

## 2024-11-22 NOTE — ED Notes (Signed)
 Non stick pad applied over knee cap with staples, another non stick pad applied to right arm skin tear, and band aid applied to third toe on left foot. Pt sent home with ice pack and understanding of D/C instructions.

## 2024-11-22 NOTE — ED Triage Notes (Addendum)
 Pt arrived via [facility]. Pt fell last night and tripped over her feet. Denies LOC and neck pain. She states that she is supposed to be using her walker. EMS states that pt was able to ambulate with assistance. Pt presents to ED with Left shoulder pain that she's guarding. Pt rates pain 5/10 pain in the arm while not moving. Pt has skin tears on right forearm, left knee, and bruising noted on left bicep. Pt takes 2 of eliquis  daily.  Pt on 2 lpm o2 via [place] at baseline.

## 2024-11-23 ENCOUNTER — Telehealth: Payer: Self-pay

## 2024-11-23 LAB — URINALYSIS, ROUTINE W REFLEX MICROSCOPIC
Bilirubin Urine: NEGATIVE
Glucose, UA: NEGATIVE mg/dL
Hgb urine dipstick: NEGATIVE
Ketones, ur: NEGATIVE mg/dL
Leukocytes,Ua: NEGATIVE
Nitrite: NEGATIVE
Protein, ur: 100 mg/dL — AB
Specific Gravity, Urine: 1.02 (ref 1.005–1.030)
pH: 7 (ref 5.0–8.0)

## 2024-11-23 MED ORDER — FUROSEMIDE 40 MG PO TABS
20.0000 mg | ORAL_TABLET | Freq: Every day | ORAL | Status: AC
  Administered 2024-11-24 – 2024-11-25 (×2): 20 mg via ORAL
  Filled 2024-11-23 (×2): qty 1

## 2024-11-23 MED ORDER — FUROSEMIDE 40 MG PO TABS
20.0000 mg | ORAL_TABLET | ORAL | Status: DC
  Administered 2024-11-26: 20 mg via ORAL
  Filled 2024-11-23: qty 1

## 2024-11-23 MED ORDER — PANTOPRAZOLE SODIUM 40 MG PO TBEC
40.0000 mg | DELAYED_RELEASE_TABLET | Freq: Every day | ORAL | Status: DC
  Administered 2024-11-23 – 2024-11-27 (×5): 40 mg via ORAL
  Filled 2024-11-23 (×5): qty 1

## 2024-11-23 MED ORDER — POTASSIUM CHLORIDE CRYS ER 20 MEQ PO TBCR
20.0000 meq | EXTENDED_RELEASE_TABLET | ORAL | Status: DC
  Administered 2024-11-23 – 2024-11-27 (×3): 20 meq via ORAL
  Filled 2024-11-23 (×3): qty 1

## 2024-11-23 MED ORDER — METOPROLOL SUCCINATE ER 50 MG PO TB24
50.0000 mg | ORAL_TABLET | Freq: Two times a day (BID) | ORAL | Status: DC
  Administered 2024-11-24 – 2024-11-27 (×7): 50 mg via ORAL
  Filled 2024-11-23 (×9): qty 1

## 2024-11-23 NOTE — Plan of Care (Signed)
" °  Problem: Acute Rehab PT Goals(only PT should resolve) Goal: Pt Will Go Supine/Side To Sit Outcome: Progressing Flowsheets (Taken 11/23/2024 1414) Pt will go Supine/Side to Sit:  with supervision  with contact guard assist Goal: Patient Will Transfer Sit To/From Stand Outcome: Progressing Flowsheets (Taken 11/23/2024 1414) Patient will transfer sit to/from stand:  with supervision  with contact guard assist Goal: Pt Will Transfer Bed To Chair/Chair To Bed Outcome: Progressing Flowsheets (Taken 11/23/2024 1414) Pt will Transfer Bed to Chair/Chair to Bed: with contact guard assist Goal: Pt Will Ambulate Outcome: Progressing Flowsheets (Taken 11/23/2024 1414) Pt will Ambulate:  50 feet  with contact guard assist  with minimal assist  with cane Note: Hemi-walker, quad-cane   2:15 PM, 11/23/2024 Lynwood Music, MPT Physical Therapist with [facility] [phone] office [phone] mobile phone  "

## 2024-11-23 NOTE — Telephone Encounter (Signed)
 Faxed to [phone]. Fax confirmation received, [facility].

## 2024-11-23 NOTE — NC FL2 (Signed)
 " [place]  MEDICAID FL2 LEVEL OF CARE FORM     IDENTIFICATION  Patient Name: Brandi Hunter Birthdate: 1940-12-31 Sex: female Admission Date (Current Location): 11/22/2024  [facility] and [place] Number:  [employer] and Address:  [facility],  [address] [place] [address]      Provider Number: [id number]  Attending Physician Name and Address:  Yolande Lamar BROCKS, MD  Relative Name and Phone Number:       Current Level of Care: Hospital Recommended Level of Care: Skilled Nursing Facility Prior Approval Number:    Date Approved/Denied:   PASRR Number: [id number]  Discharge Plan: SNF    Current Diagnoses: Patient Active Problem List   Diagnosis Date Noted   Transient ischemic attack (TIA) 08/24/2024   Diverticulitis 08/24/2024   Orthostatic hypotension 05/09/2024   Hypotension 10/01/2022   UTI (urinary tract infection) 10/01/2022   Nausea & vomiting 10/01/2022   Acute on chronic diastolic (congestive) heart failure (HCC) 05/06/2020   Acute on chronic diastolic congestive heart failure (HCC) 05/05/2020   Hypomagnesemia 05/05/2020   Stage 3b chronic kidney disease (CKD) (HCC) 05/05/2020   Secondary hypercoagulable state 02/20/2020   Acute and chronic respiratory failure (acute-on-chronic) (HCC) 08/30/2019   COPD exacerbation (HCC) 08/29/2019   Chest pain 07/18/2018   Sepsis due to urinary tract infection (HCC) 06/30/2018   AKI (acute kidney injury) 06/30/2018   Sepsis, unspecified organism (HCC) 06/30/2018   Normocytic anemia 06/30/2018   Coronary artery disease due to lipid rich plaque    Unstable angina (HCC) 03/23/2018   Acute respiratory distress 09/26/2017   Acute exacerbation of chronic obstructive pulmonary disease (COPD) (HCC) 09/26/2017   Dependence on nocturnal oxygen  therapy 09/26/2017   Type 2 diabetes mellitus (HCC) 09/26/2017   Acute on chronic diastolic CHF (congestive heart failure) (HCC) 09/26/2017   Cardiac pacemaker  11/17/2016   Eczema 11/17/2016   Major depression, recurrent, chronic 09/17/2016   Axillary abscess 07/22/2016   Chronic left shoulder pain 10/01/2015   Xerosis cutis 10/01/2015   Permanent atrial fibrillation (HCC) 05/24/2014   NAFLD (nonalcoholic fatty liver disease) 94/85/7984   Renal cyst 03/29/2014   Elevated TSH 03/06/2014   Type 2 diabetes mellitus without complication, without long-term current use of insulin  (HCC) 03/06/2014   Peripheral neuropathy 04/27/2013   Vitamin B12 deficiency 04/27/2013   Abnormality of gait 03/29/2013   Diabetic neuropathy (HCC) 03/29/2013   OSA (obstructive sleep apnea) 08/26/2012   Chronic asthmatic bronchitis (HCC) 08/26/2012   Preop cardiovascular exam 08/15/2012   Nocturnal hypoxia 04/08/2012   Seborrheic dermatitis 10/21/2011   IBS (irritable bowel syndrome) 05/27/2011   Esophageal reflux 03/27/2011   Mixed hyperlipidemia 03/27/2011   Chronic obstructive pulmonary disease (HCC) 03/27/2011   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 02/09/2011   Essential hypertension 02/09/2011   Obesity 02/09/2011   Tobacco use disorder 02/05/2011   Vitamin D deficiency 12/30/2010   Osteoarthrosis involving lower leg 12/26/2010   Allergic rhinitis 01/23/2009   Mixed incontinence 10/08/2008    Orientation RESPIRATION BLADDER Height & Weight     Self, Time, Situation, Place  O2 (2L) Continent Weight: 179 lb 14.3 oz (81.6 kg) Height:  5' 6 (167.6 cm)  BEHAVIORAL SYMPTOMS/MOOD NEUROLOGICAL BOWEL NUTRITION STATUS      Continent Diet (see after visit summary)  AMBULATORY STATUS COMMUNICATION OF NEEDS Skin   Extensive Assist Verbally Normal  Personal Care Assistance Level of Assistance  Bathing, Feeding, Dressing Bathing Assistance: Maximum assistance Feeding assistance: Independent Dressing Assistance: Maximum assistance     Functional Limitations Info  Sight, Hearing, Speech Sight  Info: Impaired Chief Executive Officer) Hearing Info: Adequate Speech Info: Adequate    SPECIAL CARE FACTORS FREQUENCY  PT (By licensed PT), OT (By licensed OT)     PT Frequency: 5 x a week OT Frequency: 5 x a week            Contractures Contractures Info: Not present    Additional Factors Info  Code Status, Allergies Code Status Info: FULL Allergies Info: Bee Venom and Latex           Current Medications (11/23/2024):  This is the current hospital active medication list Current Facility-Administered Medications  Medication Dose Route Frequency Provider Last Rate Last Admin   albuterol  (VENTOLIN  HFA) 108 (90 Base) MCG/ACT inhaler 2 puff  2 puff Inhalation Q4H PRN Sofia, Leslie K, PA-C       apixaban  (ELIQUIS ) tablet 2.5 mg  2.5 mg Oral BID Nanavati, Ankit, MD   2.5 mg at 11/23/24 0934   atorvastatin  (LIPITOR) tablet 40 mg  40 mg Oral QHS Sofia, Leslie K, PA-C   40 mg at 11/22/24 2155   DULoxetine  (CYMBALTA ) DR capsule 30 mg  30 mg Oral QHS Sofia, Leslie K, PA-C   30 mg at 11/22/24 2155   fenofibrate  tablet 160 mg  160 mg Oral QPM Sofia, Leslie K, PA-C   160 mg at 11/22/24 2042   [START ON 11/24/2024] furosemide  (LASIX ) tablet 20 mg  20 mg Oral Daily Yolande Lamar BROCKS, MD       Followed by   NOREEN ON 11/26/2024] furosemide  (LASIX ) tablet 20 mg  20 mg Oral QODAY Paterson, Robert C, MD       gabapentin  (NEURONTIN ) capsule 200 mg  200 mg Oral QHS Sofia, Leslie K, PA-C   200 mg at 11/22/24 2155   HYDROcodone -acetaminophen  (NORCO/VICODIN) 5-325 MG per tablet 1 tablet  1 tablet Oral Q4H PRN Sofia, Leslie K, PA-C   1 tablet at 11/23/24 1028   metoprolol  succinate (TOPROL -XL) 24 hr tablet 50 mg  50 mg Oral BID Yolande Lamar BROCKS, MD       pantoprazole  (PROTONIX ) EC tablet 40 mg  40 mg Oral Daily Paterson, Robert C, MD   40 mg at 11/23/24 1028   potassium chloride  SA (KLOR-CON  M) CR tablet 20 mEq  20 mEq Oral QODAY Paterson, Robert C, MD   20 mEq at 11/23/24 1028   Current Outpatient  Medications  Medication Sig Dispense Refill   HYDROcodone -acetaminophen  (NORCO/VICODIN) 5-325 MG tablet Take 1 tablet by mouth every 6 (six) hours as needed for up to 5 days for moderate pain (pain score 4-6). 15 tablet 0   acetaminophen  (TYLENOL ) 650 MG CR tablet Take 650 mg by mouth every 8 (eight) hours as needed for pain.     albuterol  (VENTOLIN  HFA) 108 (90 Base) MCG/ACT inhaler Inhale 2 puffs into the lungs every 4 (four) hours as needed for wheezing. 3 each 3   apixaban  (ELIQUIS ) 5 MG TABS tablet Take 1 tablet (5 mg total) by mouth 2 (two) times daily. 180 tablet 3   atorvastatin  (LIPITOR) 40 MG tablet Take 40 mg by mouth at bedtime.      budesonide -formoterol  (SYMBICORT ) 160-4.5 MCG/ACT inhaler Inhale 2 puffs into the lungs 2 (two) times daily. 3 each 3   Cholecalciferol (VITAMIN D3) 1.25 MG (50000 UT)  CAPS Take 1 capsule by mouth once a week.     Cyanocobalamin (B-12 PO) Take 1 tablet by mouth daily.     DULoxetine  (CYMBALTA ) 30 MG capsule Take 30 mg by mouth at bedtime.      fenofibrate  160 MG tablet Take 160 mg by mouth every evening.     furosemide  (LASIX ) 20 MG tablet Take 1 tablet (20 mg total) by mouth every other day. 45 tablet 3   gabapentin  (NEURONTIN ) 100 MG capsule Take 200 mg by mouth at bedtime.     ipratropium-albuterol  (DUONEB) 0.5-2.5 (3) MG/3ML SOLN Take 3 mLs by nebulization 4 (four) times daily. 360 mL 1   metoprolol  succinate (TOPROL -XL) 50 MG 24 hr tablet Take 1 tablet (50 mg total) by mouth 2 (two) times daily. 60 tablet 0   pantoprazole  (PROTONIX ) 40 MG tablet TAKE 1 TABLET EVERY DAY 30 tablet 11   potassium chloride  SA (KLOR-CON  M20) 20 MEQ tablet Take 1 tablet (20 mEq total) by mouth every other day. 45 tablet 3     Discharge Medications: Please see after visit summary for a list of discharge medications.  Relevant Imaging Results:  Relevant Lab Results:   Additional Information ss# [id number]  Noreen KATHEE Pinal, LCSWA     "

## 2024-11-23 NOTE — ED Provider Notes (Signed)
 Emergency Medicine Observation Re-evaluation Note  Brandi Hunter is a 84 y.o. female, seen on rounds today.  Pt initially presented to the ED for complaints of Fall Currently, the patient is does not have any acute complaints.  Reports that her arm pain is controlled.  Physical Exam  BP (!) 190/90   Pulse 70   Temp 97.9 F (36.6 C) (Oral)   Resp 16   Ht 5' 6 (1.676 m)   Wt 81.6 kg   SpO2 100%   BMI 29.04 kg/m  Physical Exam General: Resting comfortably in stretcher Lungs: Normal work of breathing Psych: Calm MSK: Left upper extremity in sling.  Hand appears warm and well-perfused  ED Course / MDM  EKG:   I have reviewed the labs performed to date as well as medications administered while in observation.  Recent changes in the last 24 hours include medically cleared.  Awaiting PT and OT evaluation and placement.  Plan  Current plan is for PT/OT evaluation.    Yolande Lamar BROCKS, MD 11/23/24 [phone]

## 2024-11-23 NOTE — Telephone Encounter (Signed)
 Received prescription request for nebulizer supplies from [employer]. Placed into Tammy's sign folder in B Pod. Once signed, will fax back to [facility] at [phone]

## 2024-11-23 NOTE — ED Notes (Signed)
 Pt assisted back into bed and given ice chips per pt request.

## 2024-11-23 NOTE — ED Notes (Signed)
 Pt up in recliner chair with PT.

## 2024-11-23 NOTE — Progress Notes (Addendum)
" °   11/23/24 1147  TOC ED Mini Assessment  TOC Time spent with patient (minutes): 40  PING Used in TOC Assessment No  Admission or Readmission Diverted Yes  Interventions which prevented an admission or readmission SNF Placement  What brought you to the Emergency Department?  Fall  Barriers to Discharge ED Bed availability;ED SNF auth  Barrier interventions SNF referral for short-term rehab  Means of departure Not know  Key Contact 1 Brandi Hunter with Patient  Contact Date 11/23/24  Contact time 1105  Call outcome At bedside  Patient states their goals for this hospitalization and ongoing recovery are: short-term rehab  CMS Medicare.gov Compare Post Acute Care list provided to: Patient  Choice offered to / list presented to  Patient    CSW spoke with patient at bedside to discuss SNF bed options. Patient expressed that she wanted her referral sent to [facility] and [facility]. Prior to SNF discussion , patient shared that she lives alone, has oxygen , WC, and walker at home and has a friend who provides her transportation needs. Referral sent out via HUB to both facilities. CSW will continue to follow  Addendum 2:05 PM  CSW spoke with patient at bedside regarding bed offers. Patient chose [facility] since [facility] does not have any open beds.CSW will work on getting auth started for approval. Will continue to follow. "

## 2024-11-23 NOTE — ED Provider Notes (Signed)
 " [place] EMERGENCY DEPARTMENT AT [facility] Provider Note   CSN: [id number] Arrival date & time: 11/22/24  1121     Patient presents with: Brandi Hunter is a 84 y.o. female.   Pt reports she tripped during the evening and fell hitting her shoulder.  Pt reports she landed on her shoulder.  Pt is unsure if she hit her head. Pt complains of a cut on her left knee, a skin tear on her left arm and pain in her left shoulder.  Pt states she is suppose to use a walker but was not using last pm. Pt called her son this am who brought her to the Emergency Department  The history is provided by the patient and a relative.  Fall This is a new problem. The current episode started 12 to 24 hours ago. Pertinent negatives include no chest pain, no abdominal pain and no headaches. Nothing relieves the symptoms. She has tried nothing for the symptoms.       Prior to Admission medications  Medication Sig Start Date End Date Taking? Authorizing Provider  HYDROcodone -acetaminophen  (NORCO/VICODIN) 5-325 MG tablet Take 1 tablet by mouth every 6 (six) hours as needed for up to 5 days for moderate pain (pain score 4-6). 11/22/24 11/27/24 Yes Thai Hemrick K, PA-C  acetaminophen  (TYLENOL ) 650 MG CR tablet Take 650 mg by mouth every 8 (eight) hours as needed for pain.    [provider]  albuterol  (VENTOLIN  HFA) 108 (90 Base) MCG/ACT inhaler Inhale 2 puffs into the lungs every 4 (four) hours as needed for wheezing. 07/20/24   Parrett, Madelin RAMAN, NP  apixaban  (ELIQUIS ) 5 MG TABS tablet Take 1 tablet (5 mg total) by mouth 2 (two) times daily. 10/03/24   Strader, Laymon HERO, PA-C  atorvastatin  (LIPITOR) 40 MG tablet Take 40 mg by mouth at bedtime.  03/17/13   [provider]  budesonide -formoterol  (SYMBICORT ) 160-4.5 MCG/ACT inhaler Inhale 2 puffs into the lungs 2 (two) times daily. 07/20/24   Parrett, Madelin RAMAN, NP  Cholecalciferol (VITAMIN D3) 1.25 MG (50000 UT) CAPS Take 1 capsule by mouth  once a week. 05/17/23   [provider]  Cyanocobalamin (B-12 PO) Take 1 tablet by mouth daily.    [provider]  DULoxetine  (CYMBALTA ) 30 MG capsule Take 30 mg by mouth at bedtime.  02/22/20   [provider]  fenofibrate  160 MG tablet Take 160 mg by mouth every evening.    [provider]  furosemide  (LASIX ) 20 MG tablet Take 1 tablet (20 mg total) by mouth every other day. 11/03/24 02/01/25  Strader, Laymon HERO, PA-C  gabapentin  (NEURONTIN ) 100 MG capsule Take 200 mg by mouth at bedtime. 12/18/20   [provider]  ipratropium-albuterol  (DUONEB) 0.5-2.5 (3) MG/3ML SOLN Take 3 mLs by nebulization 4 (four) times daily. 08/31/19   Johnson, Clanford L, MD  metoprolol  succinate (TOPROL -XL) 50 MG 24 hr tablet Take 1 tablet (50 mg total) by mouth 2 (two) times daily. 08/25/24   Marylu Gee, DO  pantoprazole  (PROTONIX ) 40 MG tablet TAKE 1 TABLET EVERY DAY 08/03/24   Debera Jayson MATSU, MD  potassium chloride  SA (KLOR-CON  M20) 20 MEQ tablet Take 1 tablet (20 mEq total) by mouth every other day. 11/03/24 02/01/25  Strader, Brittany M, PA-C    Allergies: Bee venom and Latex    Review of Systems  Cardiovascular:  Negative for chest pain.  Gastrointestinal:  Negative for abdominal pain.  Neurological:  Negative  for headaches.  All other systems reviewed and are negative.   Updated Vital Signs BP (!) 190/90   Pulse 70   Temp 97.9 F (36.6 C) (Oral)   Resp 16   Ht 5' 6 (1.676 m)   Wt 81.6 kg   SpO2 100%   BMI 29.04 kg/m   Physical Exam Vitals and nursing note reviewed.  Constitutional:      Appearance: She is well-developed.  HENT:     Head: Normocephalic and atraumatic.     Right Ear: External ear normal.     Left Ear: External ear normal.     Nose: Nose normal.     Mouth/Throat:     Mouth: Mucous membranes are moist.  Cardiovascular:     Rate and Rhythm: Normal rate.  Pulmonary:     Effort: Pulmonary effort is normal.  Abdominal:      General: There is no distension.  Musculoskeletal:     Cervical back: Normal range of motion and neck supple.     Comments: Tender left shoulder, pain with attempting to move.  From elbow wrist and hand.   Superficial skin tear right forearm, no gapping 2 cm laceration left knee, slight gapping from knee   Skin:    General: Skin is warm.  Neurological:     General: No focal deficit present.     Mental Status: She is alert and oriented to person, place, and time.     (all labs ordered are listed, but only abnormal results are displayed) Labs Reviewed  BASIC METABOLIC PANEL WITH GFR - Abnormal; Notable for the following components:      Result Value   Glucose, Bld 223 (*)    BUN 36 (*)    Creatinine, Ser 1.64 (*)    GFR, Estimated 31 (*)    All other components within normal limits  CBC WITH DIFFERENTIAL/PLATELET - Abnormal; Notable for the following components:   RBC 3.57 (*)    Hemoglobin 10.6 (*)    HCT 33.9 (*)    All other components within normal limits  URINALYSIS, ROUTINE W REFLEX MICROSCOPIC - Abnormal; Notable for the following components:   Color, Urine AMBER (*)    APPearance CLOUDY (*)    Protein, ur 100 (*)    Bacteria, UA RARE (*)    All other components within normal limits    EKG: None  Radiology: DG Knee Complete 4 Views Left Result Date: 11/22/2024 CLINICAL DATA:  Fall from standing height. EXAM: LEFT KNEE - COMPLETE 4+ VIEW COMPARISON:  08/03/2015 FINDINGS: Moderate tricompartmental osteoarthritic change most prominent over the medial compartment and patellofemoral joints. Minimal chondrocalcinosis over the medial and lateral compartments. Several small loose bodies over the posteromedial aspect of the knee joint. No acute fracture or dislocation. No significant joint effusion. IMPRESSION: 1. No acute findings. 2. Moderate osteoarthritic change. Electronically Signed   By: Toribio Agreste M.D.   On: 11/22/2024 13:15   DG Humerus Left Result Date:  11/22/2024 CLINICAL DATA:  Fall.  Left shoulder pain. EXAM: LEFT HUMERUS - 2+ VIEW COMPARISON:  Left shoulder 11/22/2024 FINDINGS: Displaced fracture involving the proximal humerus. Fracture is probably involving the surgical neck region. Suspect that the fracture is mildly comminuted but limited evaluation. Left shoulder appears located. Mid and distal humerus are intact. IMPRESSION: Displaced fracture involving the proximal left humerus. Electronically Signed   By: Juliene Balder M.D.   On: 11/22/2024 12:49   DG Shoulder Left Result Date: 11/22/2024 CLINICAL DATA:  Fall.  Left shoulder pain. EXAM: LEFT SHOULDER - 2+ VIEW COMPARISON:  Left humerus 11/22/2024 FINDINGS: Left shoulder is located. Displaced fracture involving the proximal humerus. Fracture is probably involving the surgical neck. Fracture may be mildly comminuted but limited evaluation. Normal alignment at the left [facility] joint. Visualized left ribs are intact. IMPRESSION: Displaced fracture involving the proximal left humerus. Electronically Signed   By: Juliene Balder M.D.   On: 11/22/2024 12:47   CT Head Wo Contrast Result Date: 11/22/2024 EXAM: CT HEAD WITHOUT CONTRAST 11/22/2024 12:23:12 PM TECHNIQUE: CT of the head was performed without the administration of intravenous contrast. Automated exposure control, iterative reconstruction, and/or weight based adjustment of the mA/kV was utilized to reduce the radiation dose to as low as reasonably achievable. COMPARISON: 08/24/2024 CLINICAL HISTORY: Head trauma, moderate-severe. FINDINGS: BRAIN AND VENTRICLES: No acute hemorrhage. No evidence of acute infarct. No hydrocephalus. No extra-axial collection. No mass effect or midline shift. Mild cerebral white matter disease. ORBITS: Bilateral lens surgery changes. SINUSES: No acute abnormality. SOFT TISSUES AND SKULL: No acute soft tissue abnormality. No skull fracture. IMPRESSION: 1. No acute intracranial abnormality related to head trauma. Electronically signed  by: Donnice Mania MD 11/22/2024 12:29 PM EST RP Workstation: [id number]     Procedures   Medications Ordered in the ED  atorvastatin  (LIPITOR) tablet 40 mg (40 mg Oral Given 11/22/24 2155)  albuterol  (VENTOLIN  HFA) 108 (90 Base) MCG/ACT inhaler 2 puff (has no administration in time range)  DULoxetine  (CYMBALTA ) DR capsule 30 mg (30 mg Oral Given 11/22/24 2155)  fenofibrate  tablet 160 mg (160 mg Oral Given 11/22/24 2042)  furosemide  (LASIX ) tablet 20 mg (20 mg Oral Given 11/22/24 2042)  gabapentin  (NEURONTIN ) capsule 200 mg (200 mg Oral Given 11/22/24 2155)  HYDROcodone -acetaminophen  (NORCO/VICODIN) 5-325 MG per tablet 1 tablet (1 tablet Oral Given 11/22/24 2155)  apixaban  (ELIQUIS ) tablet 2.5 mg (2.5 mg Oral Given 11/22/24 2155)  lidocaine  (PF) (XYLOCAINE ) 1 % injection 30 mL (30 mLs Infiltration Given 11/22/24 1805)  povidone-iodine  (BETADINE ) 10 % external solution (  Given 11/22/24 1806)    Clinical Course as of 11/23/24 0906  Wed Nov 22, 2024  1259 DG Shoulder Left [EL]    Clinical Course User Index [EL] Justice Herring, Student-PA                                 Medical Decision Making Pt fell last pm.  Pt was not using her walker  Amount and/or Complexity of Data Reviewed Independent Historian: caregiver    Details: PT's on is with her and helps with history Labs: ordered.    Details: Cardiology and Neurosurgery notes reviewed  Radiology: ordered.    Details: Ct head no acute findings. Xray left shoulder proximal humerus fracture  Xray left knee no fracture  Risk Prescription drug management. Risk Details: Pt placed in a shoulder immbolizer.  Pt advised to follow up with Orthopaedist in 1 week for recheck          Final diagnoses:  Humerus head fracture, left, closed, initial encounter  Laceration of left knee, initial encounter    ED Discharge Orders          Ordered    HYDROcodone -acetaminophen  (NORCO/VICODIN) 5-325 MG tablet  Every 6 hours PRN        11/22/24 1602             An After Visit Summary was printed and given to the patient.  Flint Sonny POUR, PA-C 11/23/24 9083    Charlyn Sora, MD 11/23/24 1439  "

## 2024-11-23 NOTE — ED Notes (Signed)
 Pt is back in the bed comfortably with call light in reach.

## 2024-11-23 NOTE — Evaluation (Signed)
 Occupational Therapy Evaluation Patient Details Name: Brandi Hunter MRN: [id number] DOB: [birth year] Today's Date: 11/23/2024   History of Present Illness   Brandi Hunter is a 84 y.o. female.        Pt reports she tripped during the evening and fell hitting her shoulder.  Pt reports she landed on her shoulder.  Pt is unsure if she hit her head. Pt complains of a cut on her left knee, a skin tear on her left arm and pain in her left shoulder.  Pt states she is suppose to use a walker but was not using last pm. Pt called her son this am who brought her to the Emergency Department (per PA-C)     Clinical Impressions Pt agreeable to OT and PT co-evaluation. Pt has PRN support from family at baseline, but pt reports this is very little help. Pt at level of mod I for bed mobility and min A with RW for stand pivot transfer and short distance ambulation with RW. L UE in a sling at this time. R UE was [facility] for strength and mobility. Pt demonstrates need for mod A for lower body ADL's and set up to min A for upper body ADL's. Pt left in the chair with call bell within reach. Pt will benefit from continued OT in the hospital to increase strength, balance, and endurance for safe ADL's.        If plan is discharge home, recommend the following:   A little help with walking and/or transfers;A lot of help with bathing/dressing/bathroom;Assistance with cooking/housework;Assist for transportation;Help with stairs or ramp for entrance;Assistance with feeding     Functional Status Assessment   Patient has had a recent decline in their functional status and demonstrates the ability to make significant improvements in function in a reasonable and predictable amount of time.     Equipment Recommendations   None recommended by OT             Precautions/Restrictions   Precautions Precautions: Fall Recall of Precautions/Restrictions: Intact Restrictions Weight Bearing Restrictions Per Provider  Order: No     Mobility Bed Mobility Overal bed mobility: Needs Assistance Bed Mobility: Supine to Sit     Supine to sit: Modified independent (Device/Increase time)     General bed mobility comments: Mild labored effort.    Transfers Overall transfer level: Needs assistance Equipment used: Rolling walker (2 wheels) Transfers: Sit to/from Stand, Bed to chair/wheelchair/BSC Sit to Stand: Min assist, Contact guard assist     Step pivot transfers: Min assist     General transfer comment: EOB to chair with RW      Balance Overall balance assessment: Needs assistance Sitting-balance support: No upper extremity supported, Feet supported Sitting balance-Leahy Scale: Good Sitting balance - Comments: Seated at EOB   Standing balance support: Single extremity supported, During functional activity, Reliant on assistive device for balance Standing balance-Leahy Scale: Poor Standing balance comment: poor to fair with RW                           ADL either performed or assessed with clinical judgement   ADL Overall ADL's : Needs assistance/impaired Eating/Feeding: Set up;Sitting   Grooming: Moderate assistance;Sitting   Upper Body Bathing: Minimal assistance;Sitting   Lower Body Bathing: Moderate assistance;Sitting/lateral leans   Upper Body Dressing : Minimal assistance;Sitting   Lower Body Dressing: Moderate assistance;Sitting/lateral leans   Toilet Transfer: Minimal assistance;Stand-pivot;Ambulation;Rolling walker (2 wheels) Statistician  Details (indicate cue type and reason): EOB to chair; short distance ambulation with RW Toileting- Clothing Manipulation and Hygiene: Moderate assistance;Sitting/lateral lean       Functional mobility during ADLs: Minimal assistance;Rolling walker (2 wheels) General ADL Comments: Short distance ambualtion in the hall using R UE on RW; L UE in sling.     Vision Baseline Vision/History: 1 Wears glasses Ability to See  in Adequate Light: 0 Adequate Patient Visual Report: No change from baseline Vision Assessment?: No apparent visual deficits     Perception Perception: Not tested       Praxis Praxis: Not tested       Pertinent Vitals/Pain Pain Assessment Pain Assessment: 0-10 Pain Score: 7  Pain Location: L UE Pain Descriptors / Indicators: Aching, Sharp Pain Intervention(s): Limited activity within patient's tolerance, Monitored during session, Repositioned     Extremity/Trunk Assessment Upper Extremity Assessment Upper Extremity Assessment: Generalized weakness;LUE deficits/detail LUE Deficits / Details: Sling at this time due to fracture.   Lower Extremity Assessment Lower Extremity Assessment: Defer to PT evaluation   Cervical / Trunk Assessment Cervical / Trunk Assessment: Kyphotic   Communication Communication Communication: No apparent difficulties   Cognition Arousal: Alert Behavior During Therapy: WFL for tasks assessed/performed Cognition: No apparent impairments                               Following commands: Intact       Cueing  General Comments   Cueing Techniques: Verbal cues;Tactile cues                 Home Living Family/patient expects to be discharged to:: Private residence Living Arrangements: Alone Available Help at Discharge: Family;Available PRN/intermittently Type of Home: House Home Access: Stairs to enter;Other (comment) Entrance Stairs-Number of Steps: 6 Entrance Stairs-Rails: Right;Left Home Layout: One level     Bathroom Shower/Tub: Producer, Television/film/video: Standard Bathroom Accessibility: Yes How Accessible: Accessible via wheelchair;Accessible via walker Home Equipment: Rolling Walker (2 wheels);Rollator (4 wheels);Shower seat - built in;Grab bars - tub/shower;Wheelchair - manual   Additional Comments: on 2L O2      Prior Functioning/Environment Prior Level of Function : Needs assist;History of Falls  (last six months)       Physical Assist : ADLs (physical)   ADLs (physical): IADLs Mobility Comments: Community ambulator with Rollator; does not drive. ADLs Comments: Independent ADL's; assisted for getting groceries. Pt reports cooking.    OT Problem List: Decreased strength;Decreased range of motion;Decreased activity tolerance;Impaired balance (sitting and/or standing);Impaired UE functional use;Pain   OT Treatment/Interventions: Self-care/ADL training;Therapeutic exercise;DME and/or AE instruction;Therapeutic activities;Patient/family education;Balance training      OT Goals(Current goals can be found in the care plan section)   Acute Rehab OT Goals Patient Stated Goal: Improve function. OT Goal Formulation: With patient Time For Goal Achievement: 12/07/24 Potential to Achieve Goals: Good   OT Frequency:  Min 2X/week    Co-evaluation PT/OT/SLP Co-Evaluation/Treatment: Yes Reason for Co-Treatment: To address functional/ADL transfers   OT goals addressed during session: ADL's and self-care      AM-PAC OT 6 Clicks Daily Activity     Outcome Measure Help from another person eating meals?: A Little Help from another person taking care of personal grooming?: A Little Help from another person toileting, which includes using toliet, bedpan, or urinal?: A Lot Help from another person bathing (including washing, rinsing, drying)?: A Lot Help from another person to put on  and taking off regular upper body clothing?: A Little Help from another person to put on and taking off regular lower body clothing?: A Lot 6 Click Score: 15   End of Session Equipment Utilized During Treatment: Rolling walker (2 wheels);Oxygen   Activity Tolerance: Patient tolerated treatment well Patient left: in chair;with call bell/phone within reach  OT Visit Diagnosis: Unsteadiness on feet (R26.81);Other abnormalities of gait and mobility (R26.89);Muscle weakness (generalized) (M62.81);History of  falling (Z91.81)                Time: 9173-9150 OT Time Calculation (min): 23 min Charges:  OT General Charges $OT Visit: 1 Visit OT Evaluation $OT Eval Low Complexity: 1 Low  Marjo Grosvenor OT, MOT  Jayson Person 11/23/2024, 12:32 PM

## 2024-11-23 NOTE — Plan of Care (Signed)
" °  Problem: Acute Rehab OT Goals (only OT should resolve) Goal: Pt. Will Perform Eating Flowsheets (Taken 11/23/2024 1236) Pt Will Perform Eating: with modified independence Goal: Pt. Will Perform Grooming Flowsheets (Taken 11/23/2024 1236) Pt Will Perform Grooming: with modified independence Goal: Pt. Will Perform Lower Body Bathing Flowsheets (Taken 11/23/2024 1236) Pt Will Perform Lower Body Bathing:  with modified independence  sitting/lateral leans  with adaptive equipment Goal: Pt. Will Perform Upper Body Dressing Flowsheets (Taken 11/23/2024 1236) Pt Will Perform Upper Body Dressing: with modified independence Goal: Pt. Will Perform Lower Body Dressing Flowsheets (Taken 11/23/2024 1236) Pt Will Perform Lower Body Dressing:  with modified independence  sitting/lateral leans Goal: Pt. Will Transfer To Toilet Flowsheets (Taken 11/23/2024 1236) Pt Will Transfer to Toilet:  with modified independence  ambulating Goal: Pt. Will Perform Toileting-Clothing Manipulation Flowsheets (Taken 11/23/2024 1236) Pt Will Perform Toileting - Clothing Manipulation and hygiene: with modified independence  Bassheva Flury OT, MOT  "

## 2024-11-23 NOTE — Evaluation (Signed)
 Physical Therapy Evaluation Patient Details Name: Brandi Hunter MRN: [id number] DOB: 09-22-1941 Today's Date: 11/23/2024  History of Present Illness  Brandi Hunter is a 84 y.o. female.        Pt reports she tripped during the evening and fell hitting her shoulder.  Pt reports she landed on her shoulder.  Pt is unsure if she hit her head. Pt complains of a cut on her left knee, a skin tear on her left arm and pain in her left shoulder.  Pt states she is suppose to use a walker but was not using last pm. Pt called her son this am who brought her to the Emergency Department (per PA-C)   Clinical Impression  Patient demonstrates fair return for sitting up at bedside and transferring to chair supporting herself with her RUE, very unsteady on feet when taking steps using RUE on RW and limited mostly due to fatigue, generalized weakness and LUE pain. Patient tolerated sitting up in chair after therapy - RN notified. Patient will benefit from continued skilled physical therapy in hospital and recommended venue below to increase strength, balance, endurance for safe ADLs and gait.        If plan is discharge home, recommend the following: A lot of help with walking and/or transfers;A lot of help with bathing/dressing/bathroom;Assistance with cooking/housework;Assist for transportation;Help with stairs or ramp for entrance   Can travel by private vehicle   Yes    Equipment Recommendations Cane  Recommendations for Other Services       Functional Status Assessment Patient has had a recent decline in their functional status and demonstrates the ability to make significant improvements in function in a reasonable and predictable amount of time.     Precautions / Restrictions Precautions Precautions: Fall Recall of Precautions/Restrictions: Intact Restrictions Weight Bearing Restrictions Per Provider Order: No      Mobility  Bed Mobility Overal bed mobility: Needs Assistance Bed Mobility: Supine  to Sit     Supine to sit: Modified independent (Device/Increase time)     General bed mobility comments: Mild labored effort.    Transfers Overall transfer level: Needs assistance Equipment used: Rolling walker (2 wheels) Transfers: Sit to/from Stand, Bed to chair/wheelchair/BSC Sit to Stand: Min assist, Contact guard assist   Step pivot transfers: Min assist       General transfer comment: EOB to chair with RW    Ambulation/Gait Ambulation/Gait assistance: Min assist, Mod assist Gait Distance (Feet): 25 Feet Assistive device: Rolling walker (2 wheels) Gait Pattern/deviations: Decreased step length - right, Decreased step length - left, Decreased stride length Gait velocity: decreased     General Gait Details: slow labored unsteady movement requiring incresed time making turns using RW with RUE, limited mostly due to fatigue and LUE pain  Stairs            Wheelchair Mobility     Tilt Bed    Modified Rankin (Stroke Patients Only)       Balance Overall balance assessment: Needs assistance   Sitting balance-Leahy Scale: Good Sitting balance - Comments: Seated at EOB   Standing balance support: Single extremity supported, During functional activity, Reliant on assistive device for balance Standing balance-Leahy Scale: Poor Standing balance comment: poor to fair with RW                             Pertinent Vitals/Pain Pain Assessment Pain Assessment: 0-10 Pain Score: 7  Pain  Location: LUE, mild pain left knee Pain Descriptors / Indicators: Aching, Sharp Pain Intervention(s): Limited activity within patient's tolerance, Monitored during session, Repositioned    Home Living Family/patient expects to be discharged to:: Private residence Living Arrangements: Alone Available Help at Discharge: Family;Available PRN/intermittently Type of Home: House Home Access: Stairs to enter;Other (comment) Entrance Stairs-Rails: Right;Left Entrance  Stairs-Number of Steps: 6   Home Layout: One level Home Equipment: Agricultural Consultant (2 wheels);Rollator (4 wheels);Shower seat - built in;Grab bars - tub/shower;Wheelchair - manual Additional Comments: on 2L O2    Prior Function Prior Level of Function : Needs assist;History of Falls (last six months)       Physical Assist : ADLs (physical);Mobility (physical) Mobility (physical): Bed mobility;Transfers;Gait;Stairs ADLs (physical): IADLs Mobility Comments: Community ambulator with Rollator; does not drive. ADLs Comments: Independent ADL's; assisted for getting groceries. Pt reports cooking.     Extremity/Trunk Assessment   Upper Extremity Assessment Upper Extremity Assessment: Defer to OT evaluation LUE Deficits / Details: Sling at this time due to fracture.    Lower Extremity Assessment Lower Extremity Assessment: Generalized weakness;LLE deficits/detail LLE Deficits / Details: grossly -4/5 LLE: Unable to fully assess due to pain LLE Sensation: WNL LLE Coordination: WNL    Cervical / Trunk Assessment Cervical / Trunk Assessment: Kyphotic  Communication   Communication Communication: No apparent difficulties    Cognition Arousal: Alert Behavior During Therapy: WFL for tasks assessed/performed                             Following commands: Intact       Cueing Cueing Techniques: Verbal cues, Tactile cues     General Comments      Exercises     Assessment/Plan    PT Assessment Patient needs continued PT services  PT Problem List Decreased strength;Decreased activity tolerance;Decreased balance;Decreased mobility;Pain       PT Treatment Interventions DME instruction;Gait training;Stair training;Functional mobility training;Therapeutic activities;Balance training;Patient/family education    PT Goals (Current goals can be found in the Care Plan section)  Acute Rehab PT Goals Patient Stated Goal: return home after rehab PT Goal Formulation: With  patient Time For Goal Achievement: 12/07/24 Potential to Achieve Goals: Good    Frequency Min 2X/week     Co-evaluation PT/OT/SLP Co-Evaluation/Treatment: Yes Reason for Co-Treatment: To address functional/ADL transfers PT goals addressed during session: Mobility/safety with mobility;Balance;Proper use of DME OT goals addressed during session: ADL's and self-care       AM-PAC PT 6 Clicks Mobility  Outcome Measure Help needed turning from your back to your side while in a flat bed without using bedrails?: A Little Help needed moving from lying on your back to sitting on the side of a flat bed without using bedrails?: A Little Help needed moving to and from a bed to a chair (including a wheelchair)?: A Little Help needed standing up from a chair using your arms (e.g., wheelchair or bedside chair)?: A Little Help needed to walk in hospital room?: A Lot Help needed climbing 3-5 steps with a railing? : A Lot 6 Click Score: 16    End of Session Equipment Utilized During Treatment: Gait belt Activity Tolerance: Patient tolerated treatment well;Patient limited by fatigue;Patient limited by pain Patient left: in chair;with call bell/phone within reach Nurse Communication: Mobility status PT Visit Diagnosis: Unsteadiness on feet (R26.81);Other abnormalities of gait and mobility (R26.89);Muscle weakness (generalized) (M62.81);History of falling (Z91.81)    Time: 9182-9152 PT Time Calculation (  min) (ACUTE ONLY): 30 min   Charges:   PT Evaluation $PT Eval Moderate Complexity: 1 Mod PT Treatments $Therapeutic Activity: 23-37 mins PT General Charges $$ ACUTE PT VISIT: 1 Visit         2:13 PM, 11/23/2024 Lynwood Music, MPT Physical Therapist with [facility] 336 [phone] office [phone] mobile phone

## 2024-11-23 NOTE — ED Notes (Signed)
 This tech and the RN helped pt to the bedside commode. The pt did majority of the work to get from bed to commode with little assistance. Pt is sitting on the commode at this time with call light in reach and reassured she will call when finished.

## 2024-11-24 NOTE — ED Provider Notes (Signed)
" °  Physical Exam  BP (!) 159/65 (BP Location: Right Arm)   Pulse 71   Temp 98.5 F (36.9 C) (Oral)   Resp 18   Ht 5' 6 (1.676 m)   Wt 81.6 kg   SpO2 99%   BMI 29.04 kg/m   Physical Exam  Procedures  Procedures  ED Course / MDM   Clinical Course as of 11/24/24 0707  Wed Nov 22, 2024  1259 DG Shoulder Left [EL]    Clinical Course User Index [EL] Justice Maurilio Seashore   Medical Decision Making Amount and/or Complexity of Data Reviewed Labs: ordered. Radiology: ordered.  Risk Prescription drug management.   Fall with shoulder fracture.  Pending short-term rehab.       Patsey Lot, MD 11/24/24 [phone]  "

## 2024-11-24 NOTE — ED Notes (Addendum)
 CSW checked patient auth this morning and it is still pending. CSW will continue to follow.  Addendum 3:33 PM  CSW spoke with Kristin who stated that they will be able to accept patient Monday since they do not do weekend admission. Shara is still pending and patient made aware of Monday going to facility.

## 2024-11-24 NOTE — ED Notes (Signed)
 This RN tried to give the pt her Metoprolol  and pt endorsed that she did not want to take it at this time. Pt was educated by this RN regarding her BP and this medication importance. Pt endorsed she wanted to wait so that she does not give off schedule with her taking this medication at home.

## 2024-11-25 NOTE — ED Notes (Signed)
 Spoke with A/C regarding a potential hospital bed for patient as she has to wait until Monday for SNF placement. A/C stated he will work on getting patient a bed today when one is available.

## 2024-11-25 NOTE — ED Notes (Signed)
 Gave patient an ice pack to put on top of her left shoulder, and transferred patient up bed with help from tech. Patient in room resting at this time.

## 2024-11-25 NOTE — ED Notes (Signed)
 Pt transferred to hospital bed

## 2024-11-25 NOTE — ED Provider Notes (Signed)
 Emergency Medicine Observation Re-evaluation Note  Brandi Hunter is a 84 y.o. female, seen on rounds today.  Pt initially presented to the ED for complaints of Fall Currently, the patient is awake and alert.  She presented to the ED on 1/7 and sustained a proximal humerus fx on the left.  She walks with a walker and uses oxygen  and lives alone.  She did not think she would do well at home.  A bed has been found at [facility], the facility does not do weekend admission.  Pt won't go until Monday.  Insurance authorization is still pending. Physical Exam  BP 137/87   Pulse 70   Temp 98.7 F (37.1 C) (Oral)   Resp 17   Ht 5' 6 (1.676 m)   Wt 81.6 kg   SpO2 100%   BMI 29.04 kg/m  Physical Exam General: awake and alert Cardiac: rr Lungs: clear Psych: calm  ED Course / MDM  EKG:   I have reviewed the labs performed to date as well as medications administered while in observation.  Recent changes in the last 24 hours include bed found.  Plan  Current plan is for rehab.    Dean Clarity, MD 11/25/24 [phone]

## 2024-11-26 ENCOUNTER — Encounter (HOSPITAL_COMMUNITY): Payer: Self-pay

## 2024-11-26 LAB — URINALYSIS, ROUTINE W REFLEX MICROSCOPIC
Bilirubin Urine: NEGATIVE
Glucose, UA: NEGATIVE mg/dL
Hgb urine dipstick: NEGATIVE
Ketones, ur: NEGATIVE mg/dL
Nitrite: NEGATIVE
Protein, ur: 30 mg/dL — AB
Specific Gravity, Urine: 1.02 (ref 1.005–1.030)
pH: 6 (ref 5.0–8.0)

## 2024-11-26 NOTE — ED Provider Notes (Signed)
 Emergency Medicine Observation Re-evaluation Note  Brandi Hunter is a 84 y.o. female, seen on rounds today.  Pt initially presented to the ED for complaints of Fall Currently, the patient is asleep.  She has been here since 1/7 when she fell and sustained a proximal humerus fx.  She walks with walker and uses oxygen  and lives alone.  She did not think she would do well at home.  A bed has been found at [facility], the facility does not do weekend admission.  Pt won't go until Monday.  Insurance authorization is still pending.   Physical Exam  BP 136/68   Pulse 70   Temp 98.5 F (36.9 C)   Resp 16   Ht 5' 6 (1.676 m)   Wt 81.6 kg   SpO2 97%   BMI 29.04 kg/m  Physical Exam General: asleep on bipap Cardiac: rr Lungs: clear Psych: calm  ED Course / MDM  EKG:   I have reviewed the labs performed to date as well as medications administered while in observation.  Recent changes in the last 24 hours include none.  Plan  Current plan is for rehab hopefully tomorrow.    Dean Clarity, MD 11/26/24 [phone]

## 2024-11-26 NOTE — ED Notes (Signed)
 Pt assisted up to bedside commode to have BM.  Brief changed and pt assisted back to bed.  Call bell in place.

## 2024-11-26 NOTE — ED Notes (Signed)
Assisted to the bedside commode

## 2024-11-26 NOTE — ED Notes (Signed)
 Patient RUE and LLE wound dressings changed per patient request. Initially dressed with tefla and clear tape, dressings were taken down. Wound bed not visible on RUE wound. Wound appeared well approximated and scabbed over. Patient requested it be dressed as she feels she is prone to bleeding. Redressed with petroleum gauze, dry gauze 4x4, and medipore tape. LLE dressing taken down to reveal two separate sites on the L knee. Skin tear was well approximated and scabbed over. A small avulsion below the skin tear was open with clear/yellow/green exudate. Wound redressed with petroleum gauze, dry gauze 4x4, and medipore tape.    11/26/24 1814  Wound 11/22/24 Traumatic Arm Anterior;Lower;Proximal;Right  Date First Assessed: 11/22/24   Present on Original Admission: Yes  Primary Wound Type: Traumatic  Secondary Wound Type - Traumatic: Skin Tear  Location: Arm  Location Orientation: Anterior;Lower;Proximal;Right  Drainage Amount (S)  None  Dressing Type (S)  Impregnated gauze (petrolatum);Gauze (Comment);Other (Comment) (dry gauze 4x4, medipore tape)  Dressing Changed (S)  Changed  Dressing Status (S)  Clean, Dry, Intact  Wound 11/22/24 Traumatic Knee Anterior;Left  Date First Assessed: 11/22/24   Present on Original Admission: Yes  Primary Wound Type: Traumatic  Secondary Wound Type - Traumatic: Skin Tear  Location: Knee  Location Orientation: Anterior;Left  Peri-wound Assessment (S)  Ecchymotic  Drainage Description (S)  Serosanguineous;Purulent;Green  Drainage Amount (S)  Scant  Dressing Type (S)  Impregnated gauze (petrolatum);Gauze (Comment);Other (Comment) (dry gauze 4x4, medipore tape)  Dressing Changed (S)  Changed  Dressing Status (S)  Clean, Dry, Intact

## 2024-11-26 NOTE — ED Notes (Signed)
 Called RT to notify of patient request to go on CPAP at 2000.

## 2024-11-27 NOTE — Progress Notes (Signed)
 Physical Therapy Treatment Patient Details Name: Brandi Hunter MRN: [id number] DOB: 31-Aug-1941 Today's Date: 11/27/2024   History of Present Illness Brandi Hunter is a 84 y.o. female.        Pt reports she tripped during the evening and fell hitting her shoulder.  Pt reports she landed on her shoulder.  Pt is unsure if she hit her head. Pt complains of a cut on her left knee, a skin tear on her left arm and pain in her left shoulder.  Pt states she is suppose to use a walker but was not using last pm. Pt called her son this am who brought her to the Emergency Department (per PA-C)    PT Comments  On therapist arrival; patient alert and resting in bed.  PT assists her with donning her O2 as it has fallen off.  Patient states she is leaving shortly for [facility] so PT assists her with performing exercises in bed to include ankle pumps, heel slides, quad sets.  Patient reports 5/10 pain left shoulder when at rest.  Patient will benefit from continued skilled therapy services during the remainder of her hospital stay and at the next recommended venue of care to address deficits and promote return to optimal function.        If plan is discharge home, recommend the following: A lot of help with walking and/or transfers   Can travel by private vehicle     Yes  Equipment Recommendations  Cane    Recommendations for Other Services       Precautions / Restrictions Precautions Precautions: Fall Recall of Precautions/Restrictions: Intact Precaution/Restrictions Comments: sling Restrictions Other Position/Activity Restrictions: left arm in sling     Mobility  Bed Mobility Overal bed mobility: Needs Assistance Bed Mobility: Supine to Sit     Supine to sit: Modified independent (Device/Increase time)     General bed mobility comments: Mild labored effort.    Transfers Overall transfer level: Needs assistance Equipment used: Rolling walker (2 wheels) Transfers: Sit to/from Stand,  Bed to chair/wheelchair/BSC Sit to Stand: Min assist, Contact guard assist                Ambulation/Gait                   Stairs             Wheelchair Mobility     Tilt Bed    Modified Rankin (Stroke Patients Only)       Balance Overall balance assessment: Needs assistance   Sitting balance-Leahy Scale: Good Sitting balance - Comments: Seated at EOB                                    Communication Communication Communication: No apparent difficulties  Cognition Arousal: Alert Behavior During Therapy: WFL for tasks assessed/performed   PT - Cognitive impairments: No apparent impairments                       PT - Cognition Comments: alert and oriented Following commands: Intact      Cueing Cueing Techniques: Verbal cues, Tactile cues  Exercises      General Comments        Pertinent Vitals/Pain Pain Assessment Pain Assessment: 0-10 Pain Score: 5  Pain Location: LUE, mild pain left knee Pain Descriptors / Indicators: Aching, Sharp Pain Intervention(s): Limited activity  within patient's tolerance    Home Living                          Prior Function            PT Goals (current goals can now be found in the care plan section) Acute Rehab PT Goals Patient Stated Goal: return home after rehab PT Goal Formulation: With patient Time For Goal Achievement: 12/07/24 Potential to Achieve Goals: Good Progress towards PT goals: Progressing toward goals    Frequency    Min 2X/week      PT Plan      Co-evaluation PT/OT/SLP Co-Evaluation/Treatment:  (NO)            AM-PAC PT 6 Clicks Mobility   Outcome Measure  Help needed turning from your back to your side while in a flat bed without using bedrails?: A Little Help needed moving from lying on your back to sitting on the side of a flat bed without using bedrails?: A Little Help needed moving to and from a bed to a chair (including  a wheelchair)?: A Little Help needed standing up from a chair using your arms (e.g., wheelchair or bedside chair)?: A Little Help needed to walk in hospital room?: A Lot Help needed climbing 3-5 steps with a railing? : A Lot 6 Click Score: 16    End of Session Equipment Utilized During Treatment: Gait belt Activity Tolerance: Patient tolerated treatment well;Patient limited by fatigue;Patient limited by pain     PT Visit Diagnosis: Unsteadiness on feet (R26.81);Other abnormalities of gait and mobility (R26.89);Muscle weakness (generalized) (M62.81);History of falling (Z91.81)     Time: 1315-1350 PT Time Calculation (min) (ACUTE ONLY): 35 min  Charges:    $Therapeutic Activity: 23-37 mins PT General Charges $$ ACUTE PT VISIT: 1 Visit                     2:42 PM, 11/27/2024 Zakeria Kulzer Small Tichina Koebel MPT [facility] [place] [phone] Ph:[phone]

## 2024-11-27 NOTE — ED Provider Notes (Signed)
 Emergency Medicine Observation Re-evaluation Note  Brandi Hunter is a 84 y.o. female, seen on rounds today.  Pt initially presented to the ED for complaints of Fall Currently, the patient is sleeping.  Physical Exam  BP 136/69 (BP Location: Right Arm)   Pulse 70   Temp 98.1 F (36.7 C) (Oral)   Resp 20   Ht 5' 6 (1.676 m)   Wt 81.6 kg   SpO2 97%   BMI 29.04 kg/m  Physical Exam General: No acute distress Cardiac: Well-perfused Lungs: Nonlabored Psych: Unable to assess  ED Course / MDM  EKG:EKG Interpretation Date/Time:  Saturday November 25 2024 07:50:19 EST Ventricular Rate:  70 PR Interval:  50 QRS Duration:  140 QT Interval:  439 QTC Calculation: 474 R Axis:   -85  Text Interpretation: atrial flutter with ventricular pacing Confirmed by Patsey Lot [id number]) on 11/26/2024 11:35:20 PM  I have reviewed the labs performed to date as well as medications administered while in observation.  Recent changes in the last 24 hours include none. .  Plan  Has been accepted to rehab.  Awaiting transport.    [name], MD 11/27/24 [phone]

## 2024-11-27 NOTE — ED Notes (Signed)
 Pt provided meal tray at this time. Pt resting comfortably while awaiting [facility] transport to [facility].

## 2024-11-27 NOTE — ED Notes (Addendum)
 Patient Brandi Hunter came back approved, CSW reached out to Kristin at [facility] and made her aware . Pending a response, to see if patient can come today and get room and report number.   Addendum 10:56 AM   CSW sent Kristin at [facility] a secure email with patient AVS attached. Room and report number provided to nurse. CSW signing off.

## 2024-12-06 ENCOUNTER — Encounter: Payer: Self-pay | Admitting: Orthopedic Surgery

## 2024-12-06 ENCOUNTER — Ambulatory Visit (INDEPENDENT_AMBULATORY_CARE_PROVIDER_SITE_OTHER): Admitting: Orthopedic Surgery

## 2024-12-06 ENCOUNTER — Other Ambulatory Visit: Payer: Self-pay

## 2024-12-06 VITALS — BP 144/70 | Ht 66.0 in | Wt 179.0 lb

## 2024-12-06 DIAGNOSIS — S42202A Unspecified fracture of upper end of left humerus, initial encounter for closed fracture: Secondary | ICD-10-CM | POA: Diagnosis not present

## 2024-12-06 DIAGNOSIS — W19XXXA Unspecified fall, initial encounter: Secondary | ICD-10-CM

## 2024-12-06 DIAGNOSIS — K5900 Constipation, unspecified: Secondary | ICD-10-CM | POA: Insufficient documentation

## 2024-12-06 NOTE — Progress Notes (Unsigned)
 New Patient Visit  Summary: Brandi Hunter is a 84 y.o. female with the following: 1. Closed traumatic nondisplaced fracture of proximal end of left humerus, initial encounter ***   Assessment and Plan Assessment & Plan Closed fracture of proximal left humerus Displaced, non-dislocated fracture with non-operative management due to age and osteoporosis. Anticipated adequate function with some range of motion loss. - Reviewed initial and follow-up radiographs. - Continued sling immobilization for six weeks from injury. - Advised shoulder immobilization except during showering, with arm supported. - Instructed on sling positioning to prevent discomfort. - Discussed pain management with hydrocodone /acetaminophen  as needed. - Scheduled follow-up in two weeks with repeat radiograph to assess healing and discuss initiation of gentle shoulder motion. - Instructed to contact clinic for any concerns or changes.     Follow-up: Return in about 2 weeks (around 12/20/2024).  Subjective:  Chief Complaint  Patient presents with   Shoulder Injury    Left/ ER 11/22/24 fell      Discussed the use of AI scribe software for clinical note transcription with the patient, who gave verbal consent to proceed.  History of Present Illness Brandi Hunter is an 84 year old female with a closed, displaced proximal left humerus fracture who presents for evaluation of persistent left shoulder pain and dysfunction following a fall.  She fell at home 2 weeks ago, landing on both knees and striking her left shoulder against a wall. She was not using her walker and is unsure if she tripped on her feet or oxygen  cord. She was seen in the emergency department the day of injury, hospitalized, and then transferred to a nursing facility about 1 week ago.  Since the injury she has had persistent left shoulder pain and functional limitation. She has been in a sling with left upper extremity non-weight bearing. Sling position  sometimes causes wrist discomfort, but she moves her fingers without numbness or paresthesia. Pain is treated with hydrocodone /acetaminophen  given by facility staff. She is on anticoagulation.  Before the fall she lived independently and performed activities of daily living. She already had limited left shoulder motion and could not fully elevate the arm overhead or reach behind her back, thought to be from prior tendon pathology.    Review of Systems: No fevers or chills*** No numbness or tingling No chest pain No shortness of breath No bowel or bladder dysfunction No GI distress No headaches   Medical History:  Past Medical History:  Diagnosis Date   Asthma    CAD (coronary artery disease)    a. catheterization in 03/2018 showing 90% ostial OM stenosis with medical management recommended at that time   Chronic anticoagulation    COPD (chronic obstructive pulmonary disease) (HCC)    DJD (degenerative joint disease)    Essential hypertension    GERD (gastroesophageal reflux disease)    History of home oxygen  therapy    Hyperlipidemia    Mobitz (type) II atrioventricular block    Obesity    Pacemaker    Implanted by Dr [name] (MDT) 10/06/10   PAF (paroxysmal atrial fibrillation) (HCC)    Pancreatitis 2010 OR 2011   Sleep apnea    CPAP   Type 2 diabetes mellitus (HCC)     Past Surgical History:  Procedure Laterality Date   CARDIOVERSION N/A 08/18/2019   Procedure: CARDIOVERSION;  Surgeon: Pietro Redell RAMAN, MD;  Location: [facility] ENDOSCOPY;  Service: Cardiovascular;  Laterality: N/A;   CATARACT EXTRACTION W/PHACO  11/02/2011   Procedure: CATARACT EXTRACTION PHACO AND  INTRAOCULAR LENS PLACEMENT (IOC);  Surgeon: Dow JULIANNA Burke;  Location: [facility] ORS;  Service: Ophthalmology;  Laterality: Right;  CDE=7.33   CATARACT EXTRACTION W/PHACO  12/07/2011   Procedure: CATARACT EXTRACTION PHACO AND INTRAOCULAR LENS PLACEMENT (IOC);  Surgeon: Dow JULIANNA Burke, MD;  Location: [facility] ORS;  Service:  Ophthalmology;  Laterality: Left;  CDE 3.61   COLONOSCOPY WITH PROPOFOL  N/A 08/09/2014   Procedure: COLONOSCOPY WITH PROPOFOL ;  Surgeon: Renaye Sous, MD;  Location: [facility] ENDOSCOPY;  Service: Endoscopy;  Laterality: N/A;   CYSTOCELE REPAIR     ESOPHAGOGASTRODUODENOSCOPY (EGD) WITH PROPOFOL  N/A 08/09/2014   Procedure: ESOPHAGOGASTRODUODENOSCOPY (EGD) WITH PROPOFOL ;  Surgeon: Renaye Sous, MD;  Location: [facility] ENDOSCOPY;  Service: Endoscopy;  Laterality: N/A;   EYE SURGERY  11/01/2012   BOTH EYES CATARACTS   INSERT / REPLACE / REMOVE PACEMAKER  10/06/10   MDT  implanted by Dr [name]   KNEE ARTHROSCOPY     both   LEFT HEART CATH AND CORONARY ANGIOGRAPHY N/A 03/24/2018   Procedure: LEFT HEART CATH AND CORONARY ANGIOGRAPHY;  Surgeon: Court Dorn PARAS, MD;  Location: [facility] INVASIVE CV LAB;  Service: Cardiovascular;  Laterality: N/A;   OVARY SURGERY     removal   PPM GENERATOR CHANGEOUT N/A 03/15/2023   Procedure: PPM GENERATOR CHANGEOUT;  Surgeon: Nancey, Eulas BRAVO, MD;  Location: [facility] INVASIVE CV LAB;  Service: Cardiovascular;  Laterality: N/A;   REPAIR RECTOCELE     SIMPLE MASTECTOMY WITH AXILLARY SENTINEL NODE BIOPSY Left 01/09/2015   Procedure: Irrigation and Drainage Abcess left Axilla;  Surgeon: Krystal Russell, MD;  Location: [facility] ORS;  Service: General;  Laterality: Left;   TONSILLECTOMY  AGE 26   TOTAL ABDOMINAL HYSTERECTOMY  1971    Family History  Problem Relation Age of Onset   Congestive Heart Failure Mother    Congestive Heart Failure Father    Osteoarthritis Sister    Prostate cancer Brother    Diabetes Brother    Anesthesia problems Neg Hx    Hypotension Neg Hx    Malignant hyperthermia Neg Hx    Pseudochol deficiency Neg Hx    Breast cancer Neg Hx    Social History[1]  Allergies[2]  Active Medications[3]  Objective: BP (!) 144/70 Comment: 11/22/24  Ht 5' 6 (1.676 m)   Wt 179 lb (81.2 kg)   BMI 28.89 kg/m   Physical Exam:    General: {General PE Findings:25791} Gait:  {Hjpu:74207}  Physical Exam SKIN: Minimal bruising on skin.   IMAGING: {XR Reviewed:24899}     New Medications:  No orders of the defined types were placed in this encounter.     Portions of this note were completed via Scientist, clinical (histocompatibility and immunogenetics).  Oneil DELENA Horde, MD  12/06/2024 11:05 AM      [1]  Social History Tobacco Use   Smoking status: Former    Current packs/day: 0.00    Types: Cigarettes    Quit date: 09/23/1963    Years since quitting: 61.2   Smokeless tobacco: Former    Quit date: 11/17/2011   Tobacco comments:    Stopped smoking in November 2018 for the last time.  12/20/2023 hfb  Vaping Use   Vaping status: Never Used  Substance Use Topics   Alcohol use: Not Currently    Alcohol/week: 1.0 standard drink of alcohol    Types: 1 Glasses of wine per week    Comment: couple glasses of wine occasionally-once a month   Drug use: No  [2]  Allergies Allergen Reactions  Bee Venom Swelling   Latex Swelling    LATEX CATHETERS  [3]  Current Meds  Medication Sig   HYDROcodone -acetaminophen  (NORCO/VICODIN) 5-325 MG tablet Take 1 tablet by mouth every 6 (six) hours as needed.

## 2024-12-06 NOTE — Patient Instructions (Addendum)
 Stay in the sling at all times  Follow up in 2 weeks  Medications as needed

## 2024-12-20 ENCOUNTER — Other Ambulatory Visit: Payer: Self-pay

## 2024-12-20 ENCOUNTER — Ambulatory Visit: Admitting: Orthopedic Surgery

## 2024-12-20 ENCOUNTER — Encounter: Payer: Self-pay | Admitting: Orthopedic Surgery

## 2024-12-20 ENCOUNTER — Telehealth: Payer: Self-pay | Admitting: Orthopedic Surgery

## 2024-12-20 DIAGNOSIS — S42202A Unspecified fracture of upper end of left humerus, initial encounter for closed fracture: Secondary | ICD-10-CM

## 2024-12-20 DIAGNOSIS — S42202D Unspecified fracture of upper end of left humerus, subsequent encounter for fracture with routine healing: Secondary | ICD-10-CM

## 2024-12-20 NOTE — Telephone Encounter (Signed)
 Dr. Onesimo pt - this pt was seen today, Brandi Hunter w/[facility] [phone] lvm stating that orders show for the pt to continue the sling, but the pt says she can d/c the sling, she would like clarification.

## 2024-12-20 NOTE — Patient Instructions (Signed)
 Pendulum   Stand near a wall or a surface that you can hold onto for balance. Bend at the waist and let your left / right arm hang straight down. Use your other arm to support you. Keep your back straight and do not lock your knees. Relax your left / right arm and shoulder muscles, and move your hips and your trunk so your left / right arm swings freely. Your arm should swing because of the motion of your body, not because you are using your arm or shoulder muscles. Keep moving your hips and trunk so your arm swings in the following directions, as told by your health care provider: Side to side. Forward and backward. In clockwise and counterclockwise circles. Continue each motion for 20 seconds, or for as long as told by your health care provider. Slowly return to the starting position.  Repeat 10 times. Complete this exercise daily.    Okay to remove the sling for short breaks.    Gentle range of motion of the left elbow, wrist and fingers is encouraged.  Avoid motion of the left shoulder, other than pendulums and similar activities as tolerated.

## 2024-12-20 NOTE — Progress Notes (Signed)
 Return patient Visit  Summary: Brandi Hunter is a 84 y.o. female with the following: 1. Closed traumatic nondisplaced fracture of proximal end of left humerus, subsequent encounter  Assessment & Plan Closed fracture of proximal left humerus Radiographs remain stable.  Continue to use a sling.  Okay to initiate gentle motion, including pendulums.  Short breaks of the sling are acceptable.  Medication as needed.  Follow-up in 2 weeks.  At that time, anticipate we will discuss removal of the sling, with gradual increase in motion for the left shoulder.     Follow-up: Return in about 2 weeks (around 01/03/2025).  Subjective:  Chief Complaint  Patient presents with   Fracture     Left/ ER 11/22/24 fell        Discussed the use of AI scribe software for clinical note transcription with the patient, who gave verbal consent to proceed.  History of Present Illness Brandi Hunter is an 84 year old female with a closed, displaced proximal left humerus fracture who presents for evaluation of persistent left shoulder pain and dysfunction following a fall.  She fell and injured the left proximal humerus, approximately 1 month ago.  She remains in a nursing facility.  She has done well.  She is tolerating the sling.  Limited activity thus far.  No numbness or tingling.  Medications have been effective.    Review of Systems: No fevers or chills No numbness or tingling No chest pain No shortness of breath No bowel or bladder dysfunction No GI distress No headaches   Objective: There were no vitals taken for this visit.  Physical Exam:    General: Alert and oriented. and No acute distress.  Seated in a wheelchair   Physical Exam Left hand without bruising.  No swelling.  Sensation intact throughout the left hand.  Sling is fitting appropriately.  Sensation intact in the axillary nerve distribution.  Tenderness palpation of the shoulder.   IMAGING: I personally ordered and reviewed  the following images   Limited views of the left shoulder were obtained in clinic today.  These are compared to prior x-rays.  There is no change in overall alignment.  Fracture near the surgical neck remains in stable alignment.  The shoulder is reduced.  No bony lesions.  Impression: Stable left proximal humerus fracture  New Medications:  No orders of the defined types were placed in this encounter.     Portions of this note were completed via Scientist, clinical (histocompatibility and immunogenetics).  Oneil DELENA Horde, MD  12/20/2024 11:36 AM

## 2024-12-20 NOTE — Telephone Encounter (Signed)
 Spoke w/ Brandi Hunter to clarify instructions. Pt is able to come out for short breaks and exercises provided by provider.

## 2024-12-21 ENCOUNTER — Telehealth (HOSPITAL_COMMUNITY): Payer: Self-pay | Admitting: Gastroenterology

## 2024-12-21 NOTE — Telephone Encounter (Signed)
 Attempted to contact patient at [phone] to schedule OP MBS (swallow study). Left voicemail and request for callback to acute rehab dept at [phone]. AHARRIS

## 2024-12-22 ENCOUNTER — Other Ambulatory Visit (HOSPITAL_COMMUNITY): Payer: Self-pay | Admitting: Gastroenterology

## 2024-12-22 DIAGNOSIS — R059 Cough, unspecified: Secondary | ICD-10-CM

## 2024-12-22 DIAGNOSIS — R1310 Dysphagia, unspecified: Secondary | ICD-10-CM

## 2025-01-03 ENCOUNTER — Encounter: Admitting: Orthopedic Surgery

## 2025-01-03 ENCOUNTER — Ambulatory Visit: Admitting: Student

## 2025-01-05 ENCOUNTER — Encounter: Admitting: Orthopedic Surgery

## 2025-01-16 ENCOUNTER — Encounter (HOSPITAL_COMMUNITY)

## 2025-03-28 ENCOUNTER — Ambulatory Visit

## 2025-06-27 ENCOUNTER — Ambulatory Visit

## 2025-09-26 ENCOUNTER — Ambulatory Visit

## 2025-12-26 ENCOUNTER — Ambulatory Visit

## 2026-03-27 ENCOUNTER — Ambulatory Visit
